# Patient Record
Sex: Female | Born: 1965 | Race: White | Hispanic: No | Smoking: Never smoker
Health system: Southern US, Community
[De-identification: ages and names within clinical notes are randomized; demographics above are authoritative.]

## PROBLEM LIST (undated history)

## (undated) DIAGNOSIS — M1990 Unspecified osteoarthritis, unspecified site: Secondary | ICD-10-CM

## (undated) DIAGNOSIS — B084 Enteroviral vesicular stomatitis with exanthem: Secondary | ICD-10-CM

## (undated) DIAGNOSIS — G8929 Other chronic pain: Secondary | ICD-10-CM

## (undated) DIAGNOSIS — F419 Anxiety disorder, unspecified: Secondary | ICD-10-CM

## (undated) DIAGNOSIS — M549 Dorsalgia, unspecified: Secondary | ICD-10-CM

## (undated) DIAGNOSIS — G43909 Migraine, unspecified, not intractable, without status migrainosus: Secondary | ICD-10-CM

## (undated) DIAGNOSIS — F32A Depression, unspecified: Secondary | ICD-10-CM

## (undated) DIAGNOSIS — R9431 Abnormal electrocardiogram [ECG] [EKG]: Secondary | ICD-10-CM

## (undated) DIAGNOSIS — F1120 Opioid dependence, uncomplicated: Secondary | ICD-10-CM

## (undated) DIAGNOSIS — I2510 Atherosclerotic heart disease of native coronary artery without angina pectoris: Secondary | ICD-10-CM

## (undated) DIAGNOSIS — E538 Deficiency of other specified B group vitamins: Secondary | ICD-10-CM

## (undated) DIAGNOSIS — R51 Headache: Secondary | ICD-10-CM

## (undated) DIAGNOSIS — Z972 Presence of dental prosthetic device (complete) (partial): Secondary | ICD-10-CM

## (undated) DIAGNOSIS — T753XXA Motion sickness, initial encounter: Secondary | ICD-10-CM

## (undated) DIAGNOSIS — D509 Iron deficiency anemia, unspecified: Secondary | ICD-10-CM

## (undated) DIAGNOSIS — M159 Polyosteoarthritis, unspecified: Secondary | ICD-10-CM

## (undated) DIAGNOSIS — S060X9A Concussion with loss of consciousness of unspecified duration, initial encounter: Secondary | ICD-10-CM

## (undated) DIAGNOSIS — Z8619 Personal history of other infectious and parasitic diseases: Secondary | ICD-10-CM

## (undated) DIAGNOSIS — K8020 Calculus of gallbladder without cholecystitis without obstruction: Secondary | ICD-10-CM

## (undated) DIAGNOSIS — I252 Old myocardial infarction: Secondary | ICD-10-CM

## (undated) DIAGNOSIS — S060XAA Concussion with loss of consciousness status unknown, initial encounter: Secondary | ICD-10-CM

## (undated) DIAGNOSIS — E559 Vitamin D deficiency, unspecified: Secondary | ICD-10-CM

## (undated) DIAGNOSIS — R55 Syncope and collapse: Secondary | ICD-10-CM

## (undated) DIAGNOSIS — R519 Headache, unspecified: Secondary | ICD-10-CM

## (undated) DIAGNOSIS — F329 Major depressive disorder, single episode, unspecified: Secondary | ICD-10-CM

## (undated) DIAGNOSIS — J45909 Unspecified asthma, uncomplicated: Secondary | ICD-10-CM

## (undated) DIAGNOSIS — I10 Essential (primary) hypertension: Secondary | ICD-10-CM

## (undated) DIAGNOSIS — F1190 Opioid use, unspecified, uncomplicated: Secondary | ICD-10-CM

## (undated) DIAGNOSIS — I421 Obstructive hypertrophic cardiomyopathy: Secondary | ICD-10-CM

## (undated) DIAGNOSIS — I5030 Unspecified diastolic (congestive) heart failure: Secondary | ICD-10-CM

## (undated) DIAGNOSIS — R002 Palpitations: Secondary | ICD-10-CM

## (undated) DIAGNOSIS — E669 Obesity, unspecified: Secondary | ICD-10-CM

## (undated) DIAGNOSIS — L409 Psoriasis, unspecified: Secondary | ICD-10-CM

## (undated) HISTORY — DX: Headache, unspecified: R51.9

## (undated) HISTORY — DX: Unspecified diastolic (congestive) heart failure: I50.30

## (undated) HISTORY — DX: Concussion with loss of consciousness status unknown, initial encounter: S06.0XAA

## (undated) HISTORY — DX: Headache: R51

## (undated) HISTORY — DX: Dorsalgia, unspecified: M54.9

## (undated) HISTORY — DX: Unspecified osteoarthritis, unspecified site: M19.90

## (undated) HISTORY — DX: Vitamin D deficiency, unspecified: E55.9

## (undated) HISTORY — DX: Iron deficiency anemia, unspecified: D50.9

## (undated) HISTORY — DX: Calculus of gallbladder without cholecystitis without obstruction: K80.20

## (undated) HISTORY — DX: Palpitations: R00.2

## (undated) HISTORY — DX: Unspecified asthma, uncomplicated: J45.909

## (undated) HISTORY — DX: Personal history of other infectious and parasitic diseases: Z86.19

## (undated) HISTORY — DX: Other chronic pain: G89.29

## (undated) HISTORY — DX: Concussion with loss of consciousness of unspecified duration, initial encounter: S06.0X9A

## (undated) HISTORY — DX: Migraine, unspecified, not intractable, without status migrainosus: G43.909

## (undated) HISTORY — DX: Psoriasis, unspecified: L40.9

## (undated) HISTORY — DX: Anxiety disorder, unspecified: F41.9

## (undated) HISTORY — DX: Atherosclerotic heart disease of native coronary artery without angina pectoris: I25.10

## (undated) HISTORY — PX: GASTROPLASTY: SHX192

## (undated) HISTORY — PX: ABDOMINOPLASTY: SUR9

## (undated) HISTORY — DX: Major depressive disorder, single episode, unspecified: F32.9

## (undated) HISTORY — DX: Old myocardial infarction: I25.2

## (undated) HISTORY — PX: GALLBLADDER SURGERY: SHX652

## (undated) HISTORY — DX: Obesity, unspecified: E66.9

## (undated) HISTORY — DX: Depression, unspecified: F32.A

## (undated) HISTORY — DX: Syncope and collapse: R55

## (undated) HISTORY — DX: Enteroviral vesicular stomatitis with exanthem: B08.4

## (undated) HISTORY — DX: Polyosteoarthritis, unspecified: M15.9

## (undated) HISTORY — DX: Deficiency of other specified B group vitamins: E53.8

## (undated) HISTORY — DX: Obstructive hypertrophic cardiomyopathy: I42.1

## (undated) HISTORY — DX: Abnormal electrocardiogram (ECG) (EKG): R94.31

---

## 1999-03-18 HISTORY — PX: GASTRIC BYPASS: SHX52

## 1999-03-18 HISTORY — PX: CHOLECYSTECTOMY: SHX55

## 1999-03-18 HISTORY — PX: BARIATRIC SURGERY: SHX1103

## 2005-09-22 ENCOUNTER — Ambulatory Visit: Payer: Self-pay | Admitting: Internal Medicine

## 2005-10-15 ENCOUNTER — Ambulatory Visit: Payer: Self-pay | Admitting: Internal Medicine

## 2005-11-15 ENCOUNTER — Ambulatory Visit: Payer: Self-pay | Admitting: Internal Medicine

## 2006-04-22 ENCOUNTER — Ambulatory Visit: Payer: Self-pay | Admitting: Internal Medicine

## 2006-05-16 ENCOUNTER — Ambulatory Visit: Payer: Self-pay | Admitting: Internal Medicine

## 2006-06-16 ENCOUNTER — Ambulatory Visit: Payer: Self-pay | Admitting: Internal Medicine

## 2006-07-20 ENCOUNTER — Ambulatory Visit: Payer: Self-pay

## 2006-08-03 ENCOUNTER — Ambulatory Visit: Payer: Self-pay | Admitting: Internal Medicine

## 2006-08-16 ENCOUNTER — Ambulatory Visit: Payer: Self-pay | Admitting: Internal Medicine

## 2006-10-16 ENCOUNTER — Ambulatory Visit: Payer: Self-pay | Admitting: Internal Medicine

## 2006-11-16 ENCOUNTER — Ambulatory Visit: Payer: Self-pay | Admitting: Internal Medicine

## 2007-01-16 ENCOUNTER — Ambulatory Visit: Payer: Self-pay | Admitting: Internal Medicine

## 2007-02-05 ENCOUNTER — Ambulatory Visit: Payer: Self-pay | Admitting: Internal Medicine

## 2007-02-15 ENCOUNTER — Ambulatory Visit: Payer: Self-pay | Admitting: Internal Medicine

## 2007-04-18 ENCOUNTER — Ambulatory Visit: Payer: Self-pay | Admitting: Internal Medicine

## 2007-05-28 ENCOUNTER — Ambulatory Visit: Payer: Self-pay | Admitting: Internal Medicine

## 2007-06-16 ENCOUNTER — Ambulatory Visit: Payer: Self-pay | Admitting: Internal Medicine

## 2007-07-30 ENCOUNTER — Ambulatory Visit: Payer: Self-pay | Admitting: Internal Medicine

## 2007-08-07 ENCOUNTER — Emergency Department: Payer: Self-pay | Admitting: Internal Medicine

## 2007-08-16 ENCOUNTER — Ambulatory Visit: Payer: Self-pay | Admitting: Internal Medicine

## 2007-10-26 ENCOUNTER — Ambulatory Visit: Payer: Self-pay | Admitting: Pain Medicine

## 2008-02-15 ENCOUNTER — Ambulatory Visit: Payer: Self-pay | Admitting: Internal Medicine

## 2008-02-18 ENCOUNTER — Ambulatory Visit: Payer: Self-pay | Admitting: Internal Medicine

## 2008-03-17 ENCOUNTER — Ambulatory Visit: Payer: Self-pay | Admitting: Internal Medicine

## 2008-07-20 ENCOUNTER — Emergency Department: Payer: Self-pay | Admitting: Internal Medicine

## 2008-08-15 ENCOUNTER — Ambulatory Visit: Payer: Self-pay | Admitting: Internal Medicine

## 2008-09-05 ENCOUNTER — Ambulatory Visit: Payer: Self-pay | Admitting: Internal Medicine

## 2008-09-14 ENCOUNTER — Ambulatory Visit: Payer: Self-pay | Admitting: Internal Medicine

## 2008-10-15 ENCOUNTER — Ambulatory Visit: Payer: Self-pay | Admitting: Internal Medicine

## 2009-07-20 ENCOUNTER — Ambulatory Visit: Payer: Self-pay | Admitting: Family Medicine

## 2009-07-20 DIAGNOSIS — T148XXA Other injury of unspecified body region, initial encounter: Secondary | ICD-10-CM

## 2009-07-20 DIAGNOSIS — M62838 Other muscle spasm: Secondary | ICD-10-CM | POA: Insufficient documentation

## 2010-04-16 NOTE — Assessment & Plan Note (Signed)
Summary: ANKLE INJURY/JBB   Vital Signs:  Patient Profile:   45 Years Old Female CC:      Right Ankle Pain, Due to minor injury x 5 Days Prior/ RWT Height:     64.5 inches Weight:      184 pounds BMI:     31.21 Temp:     97.6 degrees F oral Pulse rate:   64 / minute Pulse rhythm:   regular Resp:     18 per minute BP sitting:   131 / 85  (left arm)  Pt. in pain?   yes    Location:   ankle    Intensity:   6    Type:       aching  Vitals Entered By: Levonne Spiller EMT-P (Jul 20, 2009 2:47 PM)              Is Patient Diabetic? No      Current Allergies: ! PENICILLINHistory of Present Illness History from: patient Reason for visit: see chief complaint Chief Complaint: Right Ankle Pain, Due to minor injury x 5 Days Prior/ RWT History of Present Illness: Was hit just above the medial malleolus of the right leg by a softball about 5 days ago. For about 2 days after, she had difficulty bearing weight, having to walk with a cane. Now she has been wearing an ACE bandage with some relief. Walks with a limp, but with no assistance now.   Current Problems: MUSCLE STRAIN (ICD-848.9)   Current Meds WOMENS MULTIVITAMIN PLUS  TABS (MULTIPLE VITAMINS-MINERALS)  FISH OIL CONCENTRATE 300 MG CAPS (OMEGA-3 FATTY ACIDS) x 3 perday B COMPLEX  TABS (B COMPLEX VITAMINS)   REVIEW OF SYSTEMS Constitutional Symptoms      Denies fever, chills, night sweats, and fatigue.  Eyes       Denies change in vision. Ear/Nose/Throat/Mouth       Denies hearing loss/aids, change in hearing, ear discharge, dizziness, and sore throat.  Respiratory       Denies dry cough and productive cough.  Cardiovascular       Denies chest pain.    Gastrointestinal       Denies stomach pain. Neurological       Denies headaches, loss of or changes in sensation, numbness, and tngling. Musculoskeletal       Complains of joint stiffness, decreased range of motion, and swelling.      Denies joint pain and redness.     Psych       Denies mood changes, anxiety/stress, speech problems, depression, and sleep problems.  Past History:  Past Surgical History: Gastric bypass Physical Exam General appearance: well developed, well nourished, no acute distress Chest/Lungs: no rales, wheezes, or rhonchi bilateral, breath sounds equal without effort Heart: regular rate and  rhythm, no murmur Extremities: R ankle with full ROM. No tenderness at malleolus or metatarsals. + swelling, tenderness and slight bruising along the medial fibula of the right. Assessment New Problems: MUSCLE STRAIN (ICD-848.9)   Plan New Orders: New Patient Level II [99202]  The patient and/or caregiver has been counseled thoroughly with regard to medications prescribed including dosage, schedule, interactions, rationale for use, and possible side effects and they verbalize understanding.  Diagnoses and expected course of recovery discussed and will return if not improved as expected or if the condition worsens. Patient and/or caregiver verbalized understanding.   Patient Instructions: 1)  Take 650-1000mg  of Tylenol every 4-6 hours as needed for relief of pain or comfort of fever  AVOID taking more than 4000mg   in a 24 hour period (can cause liver damage in higher doses). 2)  You may move around but avoid painful motions. Apply ice to sore area for 20 minutes 3-4 times a day for 2-3 days.   I have reviewed the above medical office visit documention, including diagnoses, history, medications, clinical lists, orders and plan of care.   Rodney Langton, MD, FAAFP  Jul 20, 2009 Added new allergy or adverse reaction of PENICILLIN - Signed

## 2011-08-20 ENCOUNTER — Ambulatory Visit: Payer: Self-pay | Admitting: Internal Medicine

## 2011-09-15 ENCOUNTER — Ambulatory Visit: Payer: Self-pay | Admitting: Internal Medicine

## 2011-09-15 LAB — CBC CANCER CENTER
Comment - H1-Com1: NORMAL
Comment - H1-Com2: NORMAL
Eosinophil %: 2.8 %
Lymphocyte #: 1.9 x10 3/mm (ref 1.0–3.6)
Lymphocyte %: 34.1 %
Lymphocytes: 39 %
MCH: 28.7 pg (ref 26.0–34.0)
MCHC: 32.7 g/dL (ref 32.0–36.0)
MCV: 88 fL (ref 80–100)
Monocyte #: 0.5 x10 3/mm (ref 0.2–0.9)
Platelet: 242 x10 3/mm (ref 150–440)
RDW: 14.4 % (ref 11.5–14.5)
Segmented Neutrophils: 57 %

## 2011-09-15 LAB — RETICULOCYTES: Absolute Retic Count: 0.0449 10*6/uL (ref 0.024–0.084)

## 2011-09-15 LAB — LACTATE DEHYDROGENASE: LDH: 179 U/L (ref 84–246)

## 2011-09-15 LAB — IRON AND TIBC: Iron: 65 ug/dL (ref 50–170)

## 2011-10-16 ENCOUNTER — Ambulatory Visit: Payer: Self-pay | Admitting: Internal Medicine

## 2011-12-09 ENCOUNTER — Emergency Department: Payer: Self-pay | Admitting: *Deleted

## 2012-02-14 ENCOUNTER — Emergency Department: Payer: Self-pay | Admitting: Unknown Physician Specialty

## 2012-04-15 ENCOUNTER — Ambulatory Visit: Payer: Self-pay | Admitting: Internal Medicine

## 2012-04-17 ENCOUNTER — Ambulatory Visit: Payer: Self-pay | Admitting: Internal Medicine

## 2012-04-20 LAB — CANCER CENTER HEMOGLOBIN: HGB: 10.7 g/dL — ABNORMAL LOW (ref 12.0–16.0)

## 2012-04-20 LAB — IRON AND TIBC
Iron Bind.Cap.(Total): 443 ug/dL (ref 250–450)
Iron Saturation: 6 %
Unbound Iron-Bind.Cap.: 415 ug/dL

## 2012-04-20 LAB — FERRITIN: Ferritin (ARMC): 7 ng/mL — ABNORMAL LOW (ref 8–388)

## 2012-05-15 ENCOUNTER — Ambulatory Visit: Payer: Self-pay | Admitting: Internal Medicine

## 2012-06-21 ENCOUNTER — Ambulatory Visit: Payer: Self-pay | Admitting: Internal Medicine

## 2012-06-22 LAB — FERRITIN: Ferritin (ARMC): 75 ng/mL (ref 8–388)

## 2012-06-22 LAB — CBC CANCER CENTER
Basophil #: 0.1 x10 3/mm (ref 0.0–0.1)
Eosinophil %: 2.3 %
HCT: 36.6 % (ref 35.0–47.0)
HGB: 12 g/dL (ref 12.0–16.0)
MCH: 27.8 pg (ref 26.0–34.0)
MCV: 85 fL (ref 80–100)
Monocyte %: 7.2 %
Neutrophil #: 5.4 x10 3/mm (ref 1.4–6.5)
Neutrophil %: 68.9 %
Platelet: 197 x10 3/mm (ref 150–440)
WBC: 7.8 x10 3/mm (ref 3.6–11.0)

## 2012-06-22 LAB — IRON AND TIBC: Iron Saturation: 21 %

## 2012-07-09 ENCOUNTER — Encounter: Payer: Self-pay | Admitting: Cardiovascular Disease

## 2012-07-09 ENCOUNTER — Ambulatory Visit (INDEPENDENT_AMBULATORY_CARE_PROVIDER_SITE_OTHER): Payer: Medicare Other | Admitting: Cardiovascular Disease

## 2012-07-09 VITALS — BP 120/80 | HR 60 | Ht 66.0 in | Wt 176.5 lb

## 2012-07-09 DIAGNOSIS — R55 Syncope and collapse: Secondary | ICD-10-CM | POA: Insufficient documentation

## 2012-07-09 DIAGNOSIS — R0789 Other chest pain: Secondary | ICD-10-CM | POA: Insufficient documentation

## 2012-07-09 DIAGNOSIS — I4729 Other ventricular tachycardia: Secondary | ICD-10-CM | POA: Insufficient documentation

## 2012-07-09 DIAGNOSIS — I472 Ventricular tachycardia: Secondary | ICD-10-CM

## 2012-07-09 DIAGNOSIS — I4581 Long QT syndrome: Secondary | ICD-10-CM | POA: Insufficient documentation

## 2012-07-09 NOTE — Assessment & Plan Note (Signed)
Etiology of her recent episode of syncope is uncertain. She does report having an energy pill/drink around the time of her syncope. Uncertain if this was related. Holter monitor showing run of nonsustained VT. We have ordered a 30 day monitor to closely watch her. Heart rate is low at baseline, will not add beta blockers.

## 2012-07-09 NOTE — Patient Instructions (Addendum)
We will schedule you for an echocardiogram for prolonged QT, arrhythmia on your holter monitor, chest pain  We will order a 30 day monitor for arrhthmia, prolonged QTc  Please call us if you have new issues that need to be addressed before your next appt.  Your physician wants you to follow-up in: 5 weeks

## 2012-07-09 NOTE — Assessment & Plan Note (Signed)
Nonsustained VT, 8 beats on Holter monitor. Echocardiogram ordered to evaluate cardiac function, EF No medication changes made given bradycardia

## 2012-07-09 NOTE — Progress Notes (Signed)
Patient ID: Cheyenne Gray, female    DOB: 16-Jun-1965, 47 y.o.   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 He took 1 week or more for the bruise on her forehand to heal.  At the time of her syncope, she reports taking any energy drink/panel. 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 Uncertain if this was a factor. She does have rare episodes of chest pain, sometimes at rest and sometimes with exertion. Occasional tachycardia and palpitation episodes. She recently wore a 2 day monitor that showed rare PVCs, run of nonsustained VT, 8 beats, rare APCs. Total number of PVCs counted 370. Some bradycardia, heart rate down to 46 beats per minute per the Holter  Typically with exertion, she feels okay. No reproducible chest pain with exercise. EKG shows normal sinus rhythm, QT is 435  Notes from primary care were reviewed 48-hour Holter monitor available and was reviewed     Outpatient Encounter Prescriptions as of 07/09/2012  Medication Sig Dispense Refill  . albuterol (PROVENTIL HFA;VENTOLIN HFA) 108 (90 BASE) MCG/ACT inhaler Inhale 2 puffs into the lungs as needed for wheezing.      . gabapentin (NEURONTIN) 400 MG capsule Takes 2 tablet am and 3 tablets pm daily.      . methadone (DOLOPHINE) 10 MG tablet Take 20 mg by mouth 3 (three) times daily as needed for pain.       No facility-administered encounter medications on file as of 07/09/2012.     Review of Systems  Constitutional: Negative.   HENT: Negative.   Eyes: Negative.   Respiratory:  Negative.   Cardiovascular: Positive for chest pain.  Gastrointestinal: Negative.   Musculoskeletal: Negative.   Skin: Negative.   Neurological: Positive for syncope.  Psychiatric/Behavioral: Negative.   All other systems reviewed and are negative.    BP 120/80  Pulse 60  Ht 5\' 6"  (1.676 m)  Wt 176 lb 8 oz (80.06 kg)  BMI 28.5 kg/m2  Physical Exam  Nursing note and vitals reviewed. Constitutional: She is oriented to person, place, and time. She appears well-developed and well-nourished.  HENT:  Head: Normocephalic.  Nose: Nose normal.  Mouth/Throat: Oropharynx is clear and moist.  Eyes: Conjunctivae are normal. Pupils are equal, round, and reactive to light.  Neck: Normal range of motion. Neck supple. No JVD present.  Cardiovascular: Normal rate, regular rhythm, S1 normal, S2 normal, normal heart sounds and intact distal pulses.  Exam reveals no gallop and no friction rub.   No murmur heard. Pulmonary/Chest: Effort normal and breath sounds normal. No respiratory distress. She has no wheezes. She has no rales. She exhibits no tenderness.  Abdominal: Soft. Bowel sounds are normal. She exhibits no distension. There is no tenderness.  Musculoskeletal: Normal range of motion. She exhibits no edema and no tenderness.  Lymphadenopathy:    She has no cervical adenopathy.  Neurological: She is alert and oriented to person, place, and time. Coordination normal.  Skin: Skin is warm and dry. No rash noted. No erythema.  Psychiatric: She has a normal mood and  affect. Her behavior is normal. Judgment and thought content normal.    Assessment and Plan

## 2012-07-09 NOTE — Assessment & Plan Note (Signed)
Atypical type chest pain though concerning for runs of arrhythmia given symptoms of tachycardia. 30 day monitor ordered

## 2012-07-09 NOTE — Assessment & Plan Note (Signed)
History of prolonged QT. Adequate on today's EKG, 435. Methadone dose has been decreased in the past with improvement of her QT per the patient

## 2012-07-13 ENCOUNTER — Encounter: Payer: Self-pay | Admitting: Cardiovascular Disease

## 2012-07-15 ENCOUNTER — Ambulatory Visit: Payer: Self-pay | Admitting: Internal Medicine

## 2012-07-15 DIAGNOSIS — I4581 Long QT syndrome: Secondary | ICD-10-CM

## 2012-07-22 ENCOUNTER — Other Ambulatory Visit (INDEPENDENT_AMBULATORY_CARE_PROVIDER_SITE_OTHER): Payer: Medicare Other

## 2012-07-22 ENCOUNTER — Other Ambulatory Visit: Payer: Self-pay

## 2012-07-22 DIAGNOSIS — I4729 Other ventricular tachycardia: Secondary | ICD-10-CM

## 2012-07-22 DIAGNOSIS — R0789 Other chest pain: Secondary | ICD-10-CM

## 2012-07-22 DIAGNOSIS — I472 Ventricular tachycardia: Secondary | ICD-10-CM

## 2012-07-22 DIAGNOSIS — I4581 Long QT syndrome: Secondary | ICD-10-CM

## 2012-08-18 ENCOUNTER — Ambulatory Visit (INDEPENDENT_AMBULATORY_CARE_PROVIDER_SITE_OTHER): Payer: Medicare Other | Admitting: Cardiovascular Disease

## 2012-08-18 ENCOUNTER — Encounter: Payer: Self-pay | Admitting: Cardiovascular Disease

## 2012-08-18 VITALS — BP 112/80 | HR 58 | Ht 65.0 in | Wt 176.5 lb

## 2012-08-18 DIAGNOSIS — R55 Syncope and collapse: Secondary | ICD-10-CM

## 2012-08-18 DIAGNOSIS — I4729 Other ventricular tachycardia: Secondary | ICD-10-CM

## 2012-08-18 DIAGNOSIS — R079 Chest pain, unspecified: Secondary | ICD-10-CM

## 2012-08-18 DIAGNOSIS — R0602 Shortness of breath: Secondary | ICD-10-CM

## 2012-08-18 DIAGNOSIS — I472 Ventricular tachycardia: Secondary | ICD-10-CM

## 2012-08-18 DIAGNOSIS — R0789 Other chest pain: Secondary | ICD-10-CM

## 2012-08-18 NOTE — Patient Instructions (Addendum)
No medication changes were made.  If you continue to have lightheaded spells or pass out spells, call the office We would start a pill to support your blood pressure  Please call us if you have new issues that need to be addressed before your next appt.  Your physician wants you to follow-up in: 6 months.  You will receive a reminder letter in the mail two months in advance. If you don't receive a letter, please call our office to schedule the follow-up appointment.

## 2012-08-18 NOTE — Assessment & Plan Note (Signed)
He denies any recent symptoms of chest pressure but does report having numbness down one of her arms on one episode. Not associated with exertion. No further workup at this time

## 2012-08-18 NOTE — Assessment & Plan Note (Signed)
Short run seen on Holter monitor. No significant arrhythmia on 30 day monitor

## 2012-08-18 NOTE — Assessment & Plan Note (Signed)
No significant arrhythmia on recent 30 day monitor. She does report having near syncope or syncope 2 weeks ago. This raises the concern for arrhythmia while she was not wearing her monitor or a drop in her blood pressure, possibly from vasovagal. With her recent weight loss after gastric bypass surgery, minimal by mouth and fluid intake, her blood pressure is probably running low. I've asked her to increase her fluid and salt intake. If she continues to have symptoms, she may need further evaluation for florinef to maintain her blood pressure and minimize symptoms .

## 2012-08-18 NOTE — Progress Notes (Signed)
Patient ID: Cheyenne Gray, female    DOB: 11-Nov-1965, 47 y.o.   MRN: [id number]  HPI Comments: Ms. Gelles is a very pleasant 47 year old woman with history of obesity, gastric bypass 10 years ago, family history of prolonged QT, Initially presenting with symptoms of palpitations, tachycardia, chest pain, notes indicating history of prolonged QT in the past, on methadone for DJD and chronic back pain after a traumatic injury, history of syncope while she was standing in the doorway when she woke up after a hit to her forehand.  took 1 week or more for the bruise on her forehand to heal.  At the time of her syncope, she reports taking any energy drink/panel. 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 Uncertain if this was a factor. She does have rare episodes of chest pain, sometimes at rest and sometimes with exertion. Occasional tachycardia and palpitation episodes.   2 day monitor that showed rare PVCs, run of nonsustained VT, 8 beats, rare APCs. Total number of PVCs counted 370. Some bradycardia, heart rate down to 46 beats per minute per the Holter.  30 day monitor was performed and followup that did not show any significant arrhythmia.  Today she reports having an episode of near syncope or syncope 2 weeks ago. Uncertain if she was wearing a monitor. Denied any palpitations, just lightheaded. She was standing at the time. On further discussion, she's not eating very much since her gastric bypass. She rarely has bowel movements, as long stretches without eating. Also does not drink probably as much if she should. Weight has been dropping despite her efforts to maintain her weight. Blood pressure sometimes runs very low but she does not check it often as she does not have a blood pressure cuff.  EKG shows normal sinus rhythm with rate 58 beats per minute, no significant ST or T wave changes    Outpatient Encounter Prescriptions as of 08/18/2012  Medication Sig Dispense Refill  . albuterol  (PROVENTIL HFA;VENTOLIN HFA) 108 (90 BASE) MCG/ACT inhaler Inhale 2 puffs into the lungs as needed for wheezing.      . baclofen (LIORESAL) 10 MG tablet Take 10 mg by mouth as needed.       . gabapentin (NEURONTIN) 400 MG capsule Takes 2 tablet am and 2 tablets noon and 2 pm daily.      . methadone (DOLOPHINE) 10 MG tablet Take 20 mg by mouth 3 (three) times daily as needed for pain.         Review of Systems  Constitutional: Negative.   HENT: Negative.   Eyes: Negative.   Respiratory: Negative.   Gastrointestinal: Negative.   Musculoskeletal: Negative.   Skin: Negative.   Neurological: Positive for syncope.  Psychiatric/Behavioral: Negative.   All other systems reviewed and are negative.    BP 112/80  Pulse 58  Ht 5\' 5"  (1.651 m)  Wt 176 lb 8 oz (80.06 kg)  BMI 29.37 kg/m2  Physical Exam  Nursing note and vitals reviewed. Constitutional: She is oriented to person, place, and time. She appears well-developed and well-nourished.  HENT:  Head: Normocephalic.  Nose: Nose normal.  Mouth/Throat: Oropharynx is clear and moist.  Eyes: Conjunctivae are normal. Pupils are equal, round, and reactive to light.  Neck: Normal range of motion. Neck supple. No JVD present.  Cardiovascular: Normal rate, regular rhythm, S1 normal, S2 normal, normal heart sounds and intact distal pulses.  Exam reveals no gallop and no friction rub.   No murmur heard. Pulmonary/Chest:  Effort normal and breath sounds normal. No respiratory distress. She has no wheezes. She has no rales. She exhibits no tenderness.  Abdominal: Soft. Bowel sounds are normal. She exhibits no distension. There is no tenderness.  Musculoskeletal: Normal range of motion. She exhibits no edema and no tenderness.  Lymphadenopathy:    She has no cervical adenopathy.  Neurological: She is alert and oriented to person, place, and time. Coordination normal.  Skin: Skin is warm and dry. No rash noted. No erythema.  Psychiatric: She has a  normal mood and affect. Her behavior is normal. Judgment and thought content normal.    Assessment and Plan

## 2012-08-19 ENCOUNTER — Encounter: Payer: Self-pay | Admitting: Cardiovascular Disease

## 2012-08-19 ENCOUNTER — Encounter (INDEPENDENT_AMBULATORY_CARE_PROVIDER_SITE_OTHER): Payer: Medicare Other

## 2012-09-14 ENCOUNTER — Ambulatory Visit: Payer: Self-pay | Admitting: Internal Medicine

## 2012-10-25 ENCOUNTER — Telehealth: Payer: Self-pay | Admitting: *Deleted

## 2012-10-25 NOTE — Telephone Encounter (Signed)
Before starting anything to support BP, need to known where BP is running She needs a BP cuff Need to know circumstances of syncope Was she eating (she had been missing meals) Lost more weight from gastric bypass, if so, where is BP running  Palpitations were separate from syncope episode? Can she measure heart rate with BP cuff?

## 2012-10-25 NOTE — Telephone Encounter (Signed)
Returned call to Cheyenne Gray, Cheyenne Gray reports syncopal episode and passed out 1.5 week ago. Cheyenne Gray reports heart racing, palpitations x 1 week ago.  Unsure of BP at time of episodes does not have home monitor.  Cheyenne Gray states she is not having syncopal episodes at present, reports occasionally feeling dizzy. Per Dr Windell Hummingbird last office note 08/18/12 If you continue to have lightheaded spells or pass out spells, call the office we would start a pill to support your blood pressure.  Please advise.

## 2012-10-25 NOTE — Telephone Encounter (Signed)
Patient call she has passed out a couple of times and is have heart palpatations. Please advise

## 2012-10-26 NOTE — Telephone Encounter (Signed)
Spoke with pt advised pt to purchase BP cuff and monitor HR and BP daily at same time of day and prn when having syncopal episodes or palpitations. Advised we need to know what BP is running before we will rx a medication to increase pt's BP.  Pt is in agreence with this plan.  Pt states she is only able to eat small meals and is trying to eat them regularly.  Advised pt not eating regularly or enough po intake can cause dizziness as well.  Pt will purchase BP monitor and keep a log of BP and HR and call back with further syncopal episodes or palpitations.  Pt will also try to eat regularly and stay hydrated as well.

## 2013-01-17 ENCOUNTER — Ambulatory Visit: Payer: Self-pay | Admitting: Internal Medicine

## 2013-01-17 LAB — IRON AND TIBC
Iron Bind.Cap.(Total): 300 ug/dL (ref 250–450)
Iron Saturation: 11 %
Iron: 33 ug/dL — ABNORMAL LOW (ref 50–170)

## 2013-02-14 ENCOUNTER — Encounter (INDEPENDENT_AMBULATORY_CARE_PROVIDER_SITE_OTHER): Payer: Self-pay

## 2013-02-14 ENCOUNTER — Ambulatory Visit (INDEPENDENT_AMBULATORY_CARE_PROVIDER_SITE_OTHER): Payer: Medicare Other | Admitting: Cardiovascular Disease

## 2013-02-14 ENCOUNTER — Ambulatory Visit: Payer: Self-pay | Admitting: Internal Medicine

## 2013-02-14 ENCOUNTER — Encounter: Payer: Self-pay | Admitting: Cardiovascular Disease

## 2013-02-14 VITALS — BP 110/64 | HR 58 | Ht 68.0 in | Wt 173.2 lb

## 2013-02-14 DIAGNOSIS — R0602 Shortness of breath: Secondary | ICD-10-CM

## 2013-02-14 DIAGNOSIS — R0789 Other chest pain: Secondary | ICD-10-CM

## 2013-02-14 DIAGNOSIS — I4581 Long QT syndrome: Secondary | ICD-10-CM

## 2013-02-14 DIAGNOSIS — R000 Tachycardia, unspecified: Secondary | ICD-10-CM

## 2013-02-14 DIAGNOSIS — R079 Chest pain, unspecified: Secondary | ICD-10-CM

## 2013-02-14 DIAGNOSIS — R55 Syncope and collapse: Secondary | ICD-10-CM

## 2013-02-14 NOTE — Progress Notes (Signed)
Patient ID: Cheyenne Gray, female    DOB: Nov 29, 1965, 47 y.o.   MRN: [id number]  HPI Comments: Cheyenne Gray is a very pleasant 47 year old woman with history of obesity, gastric bypass 12 years ago, family history of prolonged QT, Initially presenting with symptoms of palpitations, tachycardia, chest pain, notes indicating history of prolonged QT in the past, on methadone for DJD and chronic back pain after a traumatic injury, history of syncope while she was standing in the doorway when she woke up after a hit to her forehand.  took 1 week or more for the bruise on her forehand to heal.  At the time of her syncope, she reports taking any energy drink/panel. 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 Uncertain if this was a factor. She continues to have rare episodes of chest pain, sometimes at rest and sometimes with exertion. Occasional tachycardia and palpitation episodes. In general she does not eat much, reports having only one bowel movement once per week which she attributes to gastric bypass surgery. She misses several meals at a time reports sometimes going for long periods without food. No recent syncope  Previously she wore a 2 day monitor that showed rare PVCs, run of nonsustained VT, 8 beats, rare APCs. Total number of PVCs counted 370. Some bradycardia, heart rate down to 46 beats per minute per the Holter.  30 day monitor was performed and followup that did not show any significant arrhythmia.  EKG shows normal sinus rhythm with rate 58 beats per minute, nonspecific ST abnormality    Outpatient Encounter Prescriptions as of 02/14/2013  Medication Sig  . albuterol (PROVENTIL HFA;VENTOLIN HFA) 108 (90 BASE) MCG/ACT inhaler Inhale 2 puffs into the lungs as needed for wheezing.  . gabapentin (NEURONTIN) 400 MG capsule Takes 2 tablet am and 2 tablets noon and 2 pm daily.  . methadone (DOLOPHINE) 10 MG tablet Take 20 mg by mouth 3 (three) times daily as needed for pain.  .  [DISCONTINUED] baclofen (LIORESAL) 10 MG tablet Take 10 mg by mouth as needed.       Review of Systems  Constitutional: Negative.   HENT: Negative.   Eyes: Negative.   Respiratory: Negative.   Cardiovascular: Negative.   Gastrointestinal: Negative.   Endocrine: Negative.   Musculoskeletal: Negative.   Skin: Negative.   Allergic/Immunologic: Negative.   Hematological: Negative.   Psychiatric/Behavioral: Negative.   All other systems reviewed and are negative.    BP 110/64  Pulse 58  Ht 5\' 8"  (1.727 m)  Wt 173 lb 4 oz (78.586 kg)  BMI 26.35 kg/m2  Physical Exam  Nursing note and vitals reviewed. Constitutional: She is oriented to person, place, and time. She appears well-developed and well-nourished.  HENT:  Head: Normocephalic.  Nose: Nose normal.  Mouth/Throat: Oropharynx is clear and moist.  Eyes: Conjunctivae are normal. Pupils are equal, round, and reactive to light.  Neck: Normal range of motion. Neck supple. No JVD present.  Cardiovascular: Normal rate, regular rhythm, S1 normal, S2 normal, normal heart sounds and intact distal pulses.  Exam reveals no gallop and no friction rub.   No murmur heard. Pulmonary/Chest: Effort normal and breath sounds normal. No respiratory distress. She has no wheezes. She has no rales. She exhibits no tenderness.  Abdominal: Soft. Bowel sounds are normal. She exhibits no distension. There is no tenderness.  Musculoskeletal: Normal range of motion. She exhibits no edema and no tenderness.  Lymphadenopathy:    She has no cervical adenopathy.  Neurological: She  is alert and oriented to person, place, and time. Coordination normal.  Skin: Skin is warm and dry. No rash noted. No erythema.  Psychiatric: She has a normal mood and affect. Her behavior is normal. Judgment and thought content normal.    Assessment and Plan

## 2013-02-14 NOTE — Patient Instructions (Signed)
You are doing well. No medication changes were made.  Please call us if you have new issues that need to be addressed before your next appt.  Your physician wants you to follow-up in: 12 months.  You will receive a reminder letter in the mail two months in advance. If you don't receive a letter, please call our office to schedule the follow-up appointment. 

## 2013-02-14 NOTE — Assessment & Plan Note (Signed)
No recent syncope. Encouraged her to stay on a consistent diet and drink plenty of fluids. Try to avoid long stretches without food as she is currently doing

## 2013-02-14 NOTE — Assessment & Plan Note (Signed)
Normal QT on today's visit. No further workup

## 2013-02-14 NOTE — Assessment & Plan Note (Signed)
Atypical in nature. No further workup at this time

## 2013-04-11 ENCOUNTER — Other Ambulatory Visit: Payer: Self-pay | Admitting: Family Medicine

## 2013-04-11 ENCOUNTER — Ambulatory Visit: Payer: Self-pay | Admitting: Family Medicine

## 2013-04-11 LAB — HCG, QUANTITATIVE, PREGNANCY: Beta Hcg, Quant.: <1 m[IU]/mL — ABNORMAL LOW

## 2013-04-22 ENCOUNTER — Encounter: Payer: Self-pay | Admitting: Cardiovascular Disease

## 2013-04-22 ENCOUNTER — Ambulatory Visit (INDEPENDENT_AMBULATORY_CARE_PROVIDER_SITE_OTHER): Payer: Medicare Other | Admitting: Cardiovascular Disease

## 2013-04-22 VITALS — BP 110/78 | HR 49 | Ht 66.0 in | Wt 173.5 lb

## 2013-04-22 DIAGNOSIS — R0789 Other chest pain: Secondary | ICD-10-CM

## 2013-04-22 DIAGNOSIS — R079 Chest pain, unspecified: Secondary | ICD-10-CM

## 2013-04-22 DIAGNOSIS — I2510 Atherosclerotic heart disease of native coronary artery without angina pectoris: Secondary | ICD-10-CM

## 2013-04-22 DIAGNOSIS — I739 Peripheral vascular disease, unspecified: Secondary | ICD-10-CM

## 2013-04-22 DIAGNOSIS — E785 Hyperlipidemia, unspecified: Secondary | ICD-10-CM

## 2013-04-22 NOTE — Assessment & Plan Note (Signed)
Chest pain is atypical in nature. Discussed with her symptoms concerning for angina. She will call our office if symptoms present with exertion
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
## 2013-04-22 NOTE — Assessment & Plan Note (Signed)
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 Again we'll need a statin, goal LDL less than 70

## 2013-04-22 NOTE — Patient Instructions (Signed)
You are doing well. No medication changes were made.  We will check cholesterol and liver numbers today Based on the numbers we can start a cholesterol medication  Please call us if you have new issues that need to be addressed before your next appt.  Your physician wants you to follow-up in: 6 months.  You will receive a reminder letter in the mail two months in advance. If you don't receive a letter, please call our office to schedule the follow-up appointment.

## 2013-04-22 NOTE — Assessment & Plan Note (Signed)
CT scan showing CAD, notably in the mid to distal RCA. We have recommended recheck cholesterol today. Goal LDL less than 70. Cholesterol panel can be started after lab work has returned. She is a nonsmoker, nondiabetic.

## 2013-04-25 ENCOUNTER — Ambulatory Visit: Payer: Self-pay | Admitting: Internal Medicine

## 2013-04-26 ENCOUNTER — Ambulatory Visit: Payer: Self-pay | Admitting: Internal Medicine

## 2013-04-26 LAB — CBC CANCER CENTER
Basophil #: 0.1 x10 3/mm (ref 0.0–0.1)
Basophil %: 1.8 %
Eosinophil #: 0.2 x10 3/mm (ref 0.0–0.7)
Eosinophil %: 3.9 %
HCT: 41.3 % (ref 35.0–47.0)
HGB: 13.6 g/dL (ref 12.0–16.0)
LYMPHS ABS: 2.7 x10 3/mm (ref 1.0–3.6)
Lymphocyte %: 42.9 %
MCH: 30.3 pg (ref 26.0–34.0)
MCHC: 32.9 g/dL (ref 32.0–36.0)
MCV: 92 fL (ref 80–100)
MONO ABS: 0.4 x10 3/mm (ref 0.2–0.9)
MONOS PCT: 6.8 %
NEUTROS ABS: 2.8 x10 3/mm (ref 1.4–6.5)
Neutrophil %: 44.6 %
Platelet: 251 x10 3/mm (ref 150–440)
RBC: 4.49 10*6/uL (ref 3.80–5.20)
RDW: 13.5 % (ref 11.5–14.5)
WBC: 6.3 x10 3/mm (ref 3.6–11.0)

## 2013-04-26 LAB — FERRITIN: FERRITIN (ARMC): 54 ng/mL (ref 8–388)

## 2013-04-26 LAB — IRON AND TIBC
IRON BIND. CAP.(TOTAL): 340 ug/dL (ref 250–450)
Iron Saturation: 39 %
Iron: 132 ug/dL (ref 50–170)
UNBOUND IRON-BIND. CAP.: 208 ug/dL

## 2013-05-04 ENCOUNTER — Other Ambulatory Visit: Payer: Self-pay | Admitting: Cardiovascular Disease

## 2013-05-04 LAB — LIPID PANEL
Cholesterol: 147 mg/dL
HDL Cholesterol: 58 mg/dL
Ldl Cholesterol, Calc: 73 mg/dL
Triglycerides: 79 mg/dL
VLDL Cholesterol, Calc: 16 mg/dL

## 2013-05-04 LAB — HEPATIC FUNCTION PANEL A (ARMC)
Albumin: 3.1 g/dL — ABNORMAL LOW
Alkaline Phosphatase: 84 U/L
Bilirubin, Direct: <0.1 mg/dL
Bilirubin,Total: 0.3 mg/dL
SGOT(AST): 38 U/L — ABNORMAL HIGH
SGPT (ALT): 39 U/L
Total Protein: 6.4 g/dL

## 2013-05-06 ENCOUNTER — Ambulatory Visit: Payer: Self-pay | Admitting: Gastroenterology

## 2013-05-10 LAB — PATHOLOGY REPORT

## 2013-05-15 ENCOUNTER — Ambulatory Visit: Payer: Self-pay | Admitting: Internal Medicine

## 2013-05-23 ENCOUNTER — Telehealth: Payer: Self-pay
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
## 2013-05-24 ENCOUNTER — Encounter: Payer: Self-pay | Admitting: Cardiovascular Disease

## 2013-05-24 ENCOUNTER — Ambulatory Visit (INDEPENDENT_AMBULATORY_CARE_PROVIDER_SITE_OTHER): Payer: Medicare Other | Admitting: Cardiovascular Disease

## 2013-05-24 VITALS — BP 100/58 | HR 57 | Ht 68.0 in | Wt 176.0 lb

## 2013-05-24 DIAGNOSIS — R0602 Shortness of breath: Secondary | ICD-10-CM

## 2013-05-24 DIAGNOSIS — I2510 Atherosclerotic heart disease of native coronary artery without angina pectoris: Secondary | ICD-10-CM

## 2013-05-24 DIAGNOSIS — R42 Dizziness and giddiness: Secondary | ICD-10-CM

## 2013-05-24 DIAGNOSIS — R55 Syncope and collapse: Secondary | ICD-10-CM

## 2013-05-24 DIAGNOSIS — R5381 Other malaise: Secondary | ICD-10-CM

## 2013-05-24 DIAGNOSIS — R5383 Other fatigue: Secondary | ICD-10-CM

## 2013-05-24 DIAGNOSIS — R0789 Other chest pain: Secondary | ICD-10-CM
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
## 2013-05-24 NOTE — Patient Instructions (Signed)
You are doing well. No medication changes were made.  Please call us if you have new issues that need to be addressed before your next appt.  Your physician wants you to follow-up in: 6 months.  You will receive a reminder letter in the mail two months in advance. If you don't receive a letter, please call our office to schedule the follow-up appointment.   

## 2013-05-25 NOTE — Assessment & Plan Note (Signed)
Currently with no symptoms of angina. No further workup at this time. Continue current medication regimen. 

## 2013-05-25 NOTE — Assessment & Plan Note (Signed)
Etiology not clear. Now at baseline. Unable to exclude side effects from methadone. She is also not eating for long periods of times ("days"). Suggested she eat several times a day, even if small meals.

## 2013-05-25 NOTE — Assessment & Plan Note (Signed)
No recent episodes. Possibly from not eating for long stretches.

## 2013-06-07 NOTE — Progress Notes (Signed)
This encounter was created in error - please disregard.

## 2013-10-13 ENCOUNTER — Ambulatory Visit: Payer: Self-pay | Admitting: Internal Medicine

## 2013-10-25 ENCOUNTER — Ambulatory Visit: Payer: Self-pay | Admitting: Internal Medicine

## 2013-10-29 ENCOUNTER — Emergency Department: Payer: Self-pay | Admitting: Emergency Medicine

## 2013-12-28 ENCOUNTER — Ambulatory Visit: Payer: Medicare Other | Admitting: Cardiovascular Disease

## 2013-12-30 ENCOUNTER — Ambulatory Visit: Payer: Self-pay | Admitting: Internal Medicine

## 2014-01-02 LAB — CBC CANCER CENTER
Basophil #: 0.1 x10 3/mm (ref 0.0–0.1)
Basophil %: 0.9 %
Eosinophil #: 0.2 x10 3/mm (ref 0.0–0.7)
Eosinophil %: 3.8 %
HCT: 39.9 % (ref 35.0–47.0)
HGB: 13 g/dL (ref 12.0–16.0)
Lymphocyte #: 2.3 x10 3/mm (ref 1.0–3.6)
Lymphocyte %: 34.5 %
MCH: 29.7 pg (ref 26.0–34.0)
MCHC: 32.5 g/dL (ref 32.0–36.0)
MCV: 91 fL (ref 80–100)
Monocyte #: 0.5 x10 3/mm (ref 0.2–0.9)
Monocyte %: 7.6 %
Neutrophil #: 3.5 x10 3/mm (ref 1.4–6.5)
Neutrophil %: 53.2 %
PLATELETS: 244 x10 3/mm (ref 150–440)
RBC: 4.36 10*6/uL (ref 3.80–5.20)
RDW: 12.8 % (ref 11.5–14.5)
WBC: 6.6 x10 3/mm (ref 3.6–11.0)

## 2014-01-02 LAB — IRON AND TIBC
IRON BIND. CAP.(TOTAL): 340 ug/dL (ref 250–450)
Iron Saturation: 12 %
Iron: 40 ug/dL — ABNORMAL LOW (ref 50–170)
Unbound Iron-Bind.Cap.: 300 ug/dL

## 2014-01-02 LAB — FERRITIN: Ferritin (ARMC): 22 ng/mL (ref 8–388)

## 2014-01-10 ENCOUNTER — Ambulatory Visit (INDEPENDENT_AMBULATORY_CARE_PROVIDER_SITE_OTHER): Payer: Medicare Other | Admitting: Cardiovascular Disease

## 2014-01-10 ENCOUNTER — Encounter: Payer: Self-pay | Admitting: Cardiovascular Disease

## 2014-01-10 VITALS — BP 112/64 | HR 74 | Ht 64.0 in | Wt 184.0 lb

## 2014-01-10 DIAGNOSIS — I2510 Atherosclerotic heart disease of native coronary artery without angina pectoris: Secondary | ICD-10-CM

## 2014-01-10 DIAGNOSIS — I493 Ventricular premature depolarization: Secondary | ICD-10-CM

## 2014-01-10 DIAGNOSIS — I4581 Long QT syndrome: Secondary | ICD-10-CM

## 2014-01-10 DIAGNOSIS — E785 Hyperlipidemia, unspecified: Secondary | ICD-10-CM

## 2014-01-10 DIAGNOSIS — R002 Palpitations: Secondary | ICD-10-CM

## 2014-01-10 MED ORDER — METOPROLOL TARTRATE 25 MG PO TABS: 25.0000 mg | ORAL_TABLET | Freq: Two times a day (BID) | ORAL | Status: DC | PRN

## 2014-01-10 NOTE — Assessment & Plan Note (Signed)
Currently with no symptoms of angina. No further workup at this time. Continue current medication regimen. 

## 2014-01-10 NOTE — Assessment & Plan Note (Signed)
Normal QTC on today's visit

## 2014-01-10 NOTE — Patient Instructions (Addendum)
Your next appointment will be scheduled in our new office located at :  [facility]  [address], [address]  [place], [place] [address]  You are doing well. Start a 1/2 to 1 whole pill as needed for palpitations  We will check labs today  Please call us if you have new issues that need to be addressed before your next appt.  Your physician wants you to follow-up in: 12 months.  You will receive a reminder letter in the mail two months in advance. If you don't receive a letter, please call our office to schedule the follow-up appointment.

## 2014-01-10 NOTE — Assessment & Plan Note (Signed)
She reports having palpitations. We'll start metoprolol 25 mg twice a day when necessary
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
## 2014-01-11 LAB — LIPID PANEL
CHOLESTEROL TOTAL: 187 mg/dL (ref 100–199)
Chol/HDL Ratio: 3 ratio units (ref 0.0–4.4)
HDL: 63 mg/dL (ref >39)
LDL CALC: 101 mg/dL — AB (ref 0–99)
Triglycerides: 115 mg/dL (ref 0–149)
VLDL CHOLESTEROL CAL: 23 mg/dL (ref 5–40)

## 2014-01-13 ENCOUNTER — Other Ambulatory Visit: Payer: Self-pay

## 2014-01-13 MED ORDER — ATORVASTATIN CALCIUM 10 MG PO TABS: 10.0000 mg | ORAL_TABLET | Freq: Every day | ORAL | Status: DC

## 2014-01-15 ENCOUNTER — Ambulatory Visit: Payer: Self-pay | Admitting: Internal Medicine

## 2014-01-24 ENCOUNTER — Telehealth: Payer: Self-pay | Admitting: *Deleted

## 2014-01-24 NOTE — Telephone Encounter (Signed)
LVM 11/10

## 2014-01-24 NOTE — Telephone Encounter (Signed)
Patient called and having symptoms of sob, very tired and swelling legs and ankles. Please call.

## 2014-01-25 ENCOUNTER — Encounter: Payer: Self-pay | Admitting: Cardiovascular Disease

## 2014-01-25 ENCOUNTER — Ambulatory Visit (INDEPENDENT_AMBULATORY_CARE_PROVIDER_SITE_OTHER): Payer: Medicare Other | Admitting: Cardiovascular Disease

## 2014-01-25 VITALS — BP 104/66 | HR 58 | Ht 66.0 in | Wt 185.5 lb

## 2014-01-25 DIAGNOSIS — I472 Ventricular tachycardia: Secondary | ICD-10-CM

## 2014-01-25 DIAGNOSIS — R55 Syncope and collapse: Secondary | ICD-10-CM

## 2014-01-25 DIAGNOSIS — I4729 Other ventricular tachycardia: Secondary | ICD-10-CM

## 2014-01-25 DIAGNOSIS — E782 Mixed hyperlipidemia: Secondary | ICD-10-CM | POA: Insufficient documentation

## 2014-01-25 DIAGNOSIS — R5382 Chronic fatigue, unspecified: Secondary | ICD-10-CM

## 2014-01-25 DIAGNOSIS — R002 Palpitations: Secondary | ICD-10-CM

## 2014-01-25 DIAGNOSIS — R079 Chest pain, unspecified: Secondary | ICD-10-CM

## 2014-01-25 DIAGNOSIS — I739 Peripheral vascular disease, unspecified: Secondary | ICD-10-CM

## 2014-01-25 DIAGNOSIS — I2510 Atherosclerotic heart disease of native coronary artery without angina pectoris: Secondary | ICD-10-CM

## 2014-01-25 DIAGNOSIS — E785 Hyperlipidemia, unspecified: Secondary | ICD-10-CM

## 2014-01-25 DIAGNOSIS — R0602 Shortness of breath: Secondary | ICD-10-CM

## 2014-01-25 MED ORDER — PROPRANOLOL HCL 10 MG PO TABS: 10.0000 mg | ORAL_TABLET | Freq: Three times a day (TID) | ORAL | Status: DC | PRN

## 2014-01-25 NOTE — Assessment & Plan Note (Signed)
No recent symptoms of syncope or near syncope.

## 2014-01-25 NOTE — Assessment & Plan Note (Signed)
Previous history of nonsustained VT, PVCs. She continues to have palpitations and tachycardia. She is requesting 30 day monitor. She did not tolerate metoprolol. 30 day monitor has been ordered for further evaluation of arrhythmia. We will also give her propranolol to take as needed, 10 mg

## 2014-01-25 NOTE — Telephone Encounter (Signed)
Pt called back yesterday, 5:11 pm and left vm, states she was returning the nurses call.

## 2014-01-25 NOTE — Patient Instructions (Addendum)
Your next appointment will be scheduled in our new office located at :  [facility]  [address], [address]  [place], [place] [address]  You are doing well. We will order a 30 day monitor for arrhythmia, previous NSVT, PVCs We will call you with the results  Take propranolol as needed for arrhythmia  Please call us if you have new issues that need to be addressed before your next appt.  Your physician wants you to follow-up in: 6 months.  You will receive a reminder letter in the mail two months in advance. If you don't receive a letter, please call our office to schedule the follow-up appointment.

## 2014-01-25 NOTE — Assessment & Plan Note (Signed)
Plaquing noted in the aorta. We'll continue aggressive cholesterol management

## 2014-01-25 NOTE — Assessment & Plan Note (Signed)
Continued insomnia, fatigue, always tired. Suspect this is from poor sleep hygiene

## 2014-01-25 NOTE — Telephone Encounter (Signed)
Left message for pt to call back  °

## 2014-01-25 NOTE — Telephone Encounter (Signed)
Spoke w/ pt.  She reports SOB, edema and palpitations.  Offered her appt to see Dr. Mariah MillingGollan this afternoon, as she has not been to school in the past 2 days.  She is agreeable to this.

## 2014-01-25 NOTE — Assessment & Plan Note (Signed)
Coronary disease seen on CT scan. We'll continue aggressive lipid management No symptoms concerning for angina
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
## 2014-01-25 NOTE — Assessment & Plan Note (Signed)
Suggested she stay on her low-dose Lipitor. Goal LDL less than 70. Recheck her cholesterol in 3 months time

## 2014-01-27 DIAGNOSIS — R002 Palpitations: Secondary | ICD-10-CM

## 2014-03-14 ENCOUNTER — Telehealth: Payer: Self-pay

## 2014-03-14 NOTE — Telephone Encounter (Signed)
Received results of pt's 30 day event monitor: "NSR, rare PVC". Left message w/ pt's results.  Asked her to call back w/ any questions or concerns.

## 2014-03-17 DIAGNOSIS — B084 Enteroviral vesicular stomatitis with exanthem: Secondary | ICD-10-CM

## 2014-03-17 HISTORY — DX: Enteroviral vesicular stomatitis with exanthem: B08.4

## 2014-03-22 ENCOUNTER — Other Ambulatory Visit: Payer: Self-pay

## 2014-03-22 ENCOUNTER — Ambulatory Visit (INDEPENDENT_AMBULATORY_CARE_PROVIDER_SITE_OTHER): Payer: Medicare Other

## 2014-03-22 DIAGNOSIS — I472 Ventricular tachycardia: Secondary | ICD-10-CM

## 2014-03-22 DIAGNOSIS — I4729 Other ventricular tachycardia: Secondary | ICD-10-CM

## 2014-03-22 DIAGNOSIS — R002 Palpitations: Secondary | ICD-10-CM

## 2014-03-22 DIAGNOSIS — I4581 Long QT syndrome: Secondary | ICD-10-CM

## 2014-03-22 DIAGNOSIS — R0602 Shortness of breath: Secondary | ICD-10-CM

## 2014-03-22 DIAGNOSIS — R079 Chest pain, unspecified: Secondary | ICD-10-CM

## 2014-06-20 ENCOUNTER — Ambulatory Visit
Admit: 2014-06-20 | Disposition: A | Discharge status: Home / Self Care | Payer: Self-pay | Attending: Internal Medicine | Admitting: Internal Medicine

## 2014-07-03 ENCOUNTER — Ambulatory Visit
Admit: 2014-07-03 | Disposition: A | Discharge status: Home / Self Care | Payer: Self-pay | Attending: Family Medicine | Admitting: Family Medicine

## 2014-08-30 ENCOUNTER — Telehealth: Payer: Self-pay | Admitting: Pain Medicine

## 2014-08-30 NOTE — Telephone Encounter (Signed)
Left pt vm to sch apt with dr crisp

## 2014-09-13 ENCOUNTER — Other Ambulatory Visit: Payer: Medicare Other

## 2014-09-13 ENCOUNTER — Ambulatory Visit: Payer: Medicare Other

## 2014-09-13 ENCOUNTER — Ambulatory Visit: Payer: Medicare Other | Admitting: Internal Medicine

## 2014-09-14 ENCOUNTER — Encounter: Payer: Self-pay | Admitting: Pain Medicine

## 2014-09-14 ENCOUNTER — Ambulatory Visit: Payer: Medicare Other | Attending: Pain Medicine | Admitting: Pain Medicine

## 2014-09-14 VITALS — BP 130/73 | HR 65 | Temp 98.1°F | Resp 18 | Ht 65.0 in | Wt 185.0 lb

## 2014-09-14 DIAGNOSIS — M79604 Pain in right leg: Secondary | ICD-10-CM | POA: Diagnosis present

## 2014-09-14 DIAGNOSIS — F410 Panic disorder [episodic paroxysmal anxiety] without agoraphobia: Secondary | ICD-10-CM | POA: Insufficient documentation

## 2014-09-14 DIAGNOSIS — M5135 Other intervertebral disc degeneration, thoracolumbar region: Secondary | ICD-10-CM | POA: Insufficient documentation

## 2014-09-14 DIAGNOSIS — G588 Other specified mononeuropathies: Secondary | ICD-10-CM | POA: Insufficient documentation

## 2014-09-14 DIAGNOSIS — M5134 Other intervertebral disc degeneration, thoracic region: Secondary | ICD-10-CM | POA: Insufficient documentation

## 2014-09-14 DIAGNOSIS — M545 Low back pain: Secondary | ICD-10-CM | POA: Diagnosis present

## 2014-09-14 DIAGNOSIS — M5136 Other intervertebral disc degeneration, lumbar region: Secondary | ICD-10-CM | POA: Insufficient documentation

## 2014-09-14 DIAGNOSIS — M533 Sacrococcygeal disorders, not elsewhere classified: Secondary | ICD-10-CM | POA: Diagnosis not present

## 2014-09-14 DIAGNOSIS — J45909 Unspecified asthma, uncomplicated: Secondary | ICD-10-CM | POA: Diagnosis not present

## 2014-09-14 DIAGNOSIS — M47816 Spondylosis without myelopathy or radiculopathy, lumbar region: Secondary | ICD-10-CM

## 2014-09-14 DIAGNOSIS — M47814 Spondylosis without myelopathy or radiculopathy, thoracic region: Secondary | ICD-10-CM

## 2014-09-14 DIAGNOSIS — M79605 Pain in left leg: Secondary | ICD-10-CM | POA: Diagnosis present

## 2014-09-14 DIAGNOSIS — M47894 Other spondylosis, thoracic region: Secondary | ICD-10-CM | POA: Insufficient documentation
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
## 2014-09-14 NOTE — Progress Notes (Signed)
Safety precautions to be maintained throughout the outpatient stay will include: orient to surroundings, keep bed in low position, maintain call bell within reach at all times, provide assistance with transfer out of bed and ambulation.  

## 2014-09-14 NOTE — Patient Instructions (Addendum)
Continue present medications methadone and Neurontin  Lumbar facet, medial branch nerve, blocks to be performed Monday, 10/02/2014  F/U PCP for evaliation of  BP and general medical  Condition  Please address nurses and receptionist the dates for your lumbar and thoracic MRIs  F/U surgical evaluation  F/U neurological evaluation  May consider radiofrequency rhizolysis or intraspinal procedures pending response to present treatment and F/U evaluation.  Patient to call Pain Management Center should patient have concerns prior to scheduled return appointment. Facet Blocks Patient Information  Description: The facets are joints in the spine between the vertebrae.  Like any joints in the body, facets can become irritated and painful.  Arthritis can also effect the facets.  By injecting steroids and local anesthetic in and around these joints, we can temporarily block the nerve supply to them.  Steroids act directly on irritated nerves and tissues to reduce selling and inflammation which often leads to decreased pain.  Facet blocks may be done anywhere along the spine from the neck to the low back depending upon the location of your pain.   After numbing the skin with local anesthetic (like Novocaine), a small needle is passed onto the facet joints under x-ray guidance.  You may experience a sensation of pressure while this is being done.  The entire block usually lasts about 15-25 minutes.   Conditions which may be treated by facet blocks:   Low back/buttock pain  Neck/shoulder pain  Certain types of headaches  Preparation for the injection:  1. Do not eat any solid food or dairy products within 6 hours of your appointment. 2. You may drink clear liquid up to 2 hours before appointment.  Clear liquids include water, black coffee, juice or soda.  No milk or cream please. 3. You may take your regular medication, including pain medications, with a sip of water before your appointment.   Diabetics should hold regular insulin (if taken separately) and take 1/2 normal NPH dose the morning of the procedure.  Carry some sugar containing items with you to your appointment. 4. A driver must accompany you and be prepared to drive you home after your procedure. 5. Bring all your current medications with you. 6. An IV may be inserted and sedation may be given at the discretion of the physician. 7. A blood pressure cuff, EKG and other monitors will often be applied during the procedure.  Some patients may need to have extra oxygen administered for a short period. 8. You will be asked to provide medical information, including your allergies and medications, prior to the procedure.  We must know immediately if you are taking blood thinners (like Coumadin/Warfarin) or if you are allergic to IV iodine contrast (dye).  We must know if you could possible be pregnant.  Possible side-effects:   Bleeding from needle site  Infection (rare, may require surgery)  Nerve injury (rare)  Numbness & tingling (temporary)  Difficulty urinating (rare, temporary)  Spinal headache (a headache worse with upright posture)  Light-headedness (temporary)  Pain at injection site (serveral days)  Decreased blood pressure (rare, temporary)  Weakness in arm/leg (temporary)  Pressure sensation in back/neck (temporary)   Call if you experience:   Fever/chills associated with headache or increased back/neck pain  Headache worsened by an upright position  New onset, weakness or numbness of an extremity below the injection site  Hives or difficulty breathing (go to the emergency room)  Inflammation or drainage at the injection site(s)  Severe back/neck pain greater  than usual  New symptoms which are concerning to you  Please note:  Although the local anesthetic injected can often make your back or neck feel good for several hours after the injection, the pain will likely return. It takes 3-7  days for steroids to work.  You may not notice any pain relief for at least one week.  If effective, we will often do a series of 2-3 injections spaced 3-6 weeks apart to maximally decrease your pain.  After the initial series, you may be a candidate for a more permanent nerve block of the facets.  If you have any questions, please call #[phone] [facility]

## 2014-09-19 ENCOUNTER — Inpatient Hospital Stay: Payer: Medicare Other

## 2014-09-19 ENCOUNTER — Inpatient Hospital Stay (HOSPITAL_BASED_OUTPATIENT_CLINIC_OR_DEPARTMENT_OTHER): Payer: Medicare Other | Admitting: Internal Medicine

## 2014-09-19 ENCOUNTER — Inpatient Hospital Stay: Payer: Medicare Other | Attending: Internal Medicine

## 2014-09-19 VITALS — BP 114/77 | HR 55 | Temp 96.4°F | Resp 18 | Ht 65.0 in | Wt 194.4 lb

## 2014-09-19 DIAGNOSIS — R5383 Other fatigue: Secondary | ICD-10-CM

## 2014-09-19 DIAGNOSIS — D509 Iron deficiency anemia, unspecified: Secondary | ICD-10-CM | POA: Insufficient documentation

## 2014-09-19 DIAGNOSIS — L409 Psoriasis, unspecified: Secondary | ICD-10-CM

## 2014-09-19 DIAGNOSIS — M129 Arthropathy, unspecified: Secondary | ICD-10-CM | POA: Diagnosis not present

## 2014-09-19 DIAGNOSIS — Z9884 Bariatric surgery status: Secondary | ICD-10-CM | POA: Insufficient documentation

## 2014-09-19 DIAGNOSIS — F329 Major depressive disorder, single episode, unspecified: Secondary | ICD-10-CM | POA: Diagnosis not present

## 2014-09-19 DIAGNOSIS — Z8669 Personal history of other diseases of the nervous system and sense organs: Secondary | ICD-10-CM

## 2014-09-19 DIAGNOSIS — G8929 Other chronic pain: Secondary | ICD-10-CM | POA: Insufficient documentation

## 2014-09-19 DIAGNOSIS — Z79899 Other long term (current) drug therapy: Secondary | ICD-10-CM | POA: Diagnosis not present

## 2014-09-19 DIAGNOSIS — M1990 Unspecified osteoarthritis, unspecified site: Secondary | ICD-10-CM | POA: Diagnosis not present

## 2014-09-19 DIAGNOSIS — J45909 Unspecified asthma, uncomplicated: Secondary | ICD-10-CM | POA: Diagnosis not present

## 2014-09-19 DIAGNOSIS — R011 Cardiac murmur, unspecified: Secondary | ICD-10-CM | POA: Insufficient documentation

## 2014-09-19 DIAGNOSIS — E669 Obesity, unspecified: Secondary | ICD-10-CM

## 2014-09-19 DIAGNOSIS — E538 Deficiency of other specified B group vitamins: Secondary | ICD-10-CM | POA: Diagnosis not present

## 2014-09-19 DIAGNOSIS — E559 Vitamin D deficiency, unspecified: Secondary | ICD-10-CM | POA: Insufficient documentation

## 2014-09-19 LAB — IRON AND TIBC
Iron: 76 ug/dL (ref 28–170)
Saturation Ratios: 28 % (ref 10.4–31.8)
TIBC: 275 ug/dL (ref 250–450)
UIBC: 199 ug/dL

## 2014-09-19 LAB — CBC WITH DIFFERENTIAL/PLATELET
BASOS PCT: 1 %
Basophils Absolute: 0.1 10*3/uL (ref 0–0.1)
EOS ABS: 0.3 10*3/uL (ref 0–0.7)
Eosinophils Relative: 4 %
HCT: 36.6 % (ref 35.0–47.0)
Hemoglobin: 12.2 g/dL (ref 12.0–16.0)
Lymphocytes Relative: 41 %
Lymphs Abs: 3.2 10*3/uL (ref 1.0–3.6)
MCH: 30.1 pg (ref 26.0–34.0)
MCHC: 33.3 g/dL (ref 32.0–36.0)
MCV: 90.3 fL (ref 80.0–100.0)
MONO ABS: 0.8 10*3/uL (ref 0.2–0.9)
Monocytes Relative: 11 %
NEUTROS PCT: 43 %
Neutro Abs: 3.4 10*3/uL (ref 1.4–6.5)
PLATELETS: 204 10*3/uL (ref 150–440)
RBC: 4.06 MIL/uL (ref 3.80–5.20)
RDW: 12.9 % (ref 11.5–14.5)
WBC: 7.8 10*3/uL (ref 3.6–11.0)

## 2014-09-19 LAB — FERRITIN: Ferritin: 45 ng/mL (ref 11–307)

## 2014-09-19 MED ORDER — SODIUM CHLORIDE 0.9 % IV SOLN
100.0000 mg | Freq: Once | INTRAVENOUS | Status: AC
  Administered 2014-09-19: 100 mg via INTRAVENOUS
  Filled 2014-09-19: qty 5

## 2014-09-21 ENCOUNTER — Telehealth: Payer: Self-pay | Admitting: Pain Medicine

## 2014-09-21 ENCOUNTER — Ambulatory Visit: Payer: Medicare Other

## 2014-09-21 NOTE — Telephone Encounter (Signed)
Lou from MRI called to let us know Ms Cheyenne Gray did not show up for her MRI today.

## 2014-09-27 ENCOUNTER — Other Ambulatory Visit: Payer: Self-pay | Admitting: Pain Medicine

## 2014-09-28 NOTE — Progress Notes (Signed)
[facility]  [phone] [phone]) [phone]     ID: Cheyenne Gray OB: [birth year]  MR#: [id number]  QMV#:[id number]  Patient Care Team: Emogene Morgan, MD as PCP - General (Family Medicine) Emogene Morgan, MD as Referring Physician (Family Medicine)  CHIEF COMPLAINT/DIAGNOSIS:  Iron-deficiency anemia. Intolerant to oral iron supplementation. Also has a history of gastric bypass in 2001.  On parenteral iron therapy for recurrent iron deficiency.   HISTORY OF PRESENT ILLNESS:  Patient returns for continued hematology followup. She was seen in Oct 2015. She received her maintenance Venofer treatment in April and  November of 2014, and in Oct 2015. Currently states that she has fatigue on exertion, otherwise remains physically activity. Otherwise, no new dyspnea, palpitation, angina, dizziness, orthopnea, or PND. Denies any new bleeding symptoms including bright red blood in stools or hematuria. States that sometimes menstrual periods are heavier but otherwise mostly regular. No new bone pains. Appetite is good, no unintentional weight loss. No new paresthesias in extremities.   REVIEW OF SYSTEMS:   ROS As in HPI above. In addition, no fevers or sweats. No new headaches or focal weakness.  No new sore throat, cough, sputum, hemoptysis or chest pain. No dizziness or palpitation. No abdominal pain, constipation, diarrhea, dysuria or hematuria. No new skin rash or bleeding symptoms. No new paresthesias in extremities.    PAST MEDICAL HISTORY: Reviewed. Past Medical History  Diagnosis Date  . Migraines   . Chronic pain   . DJD (degenerative joint disease)   . Iron deficiency anemia   . Long QT interval   . Syncope and collapse   . Iron deficiency   . Heart murmur     as child  . Arthritis   . Asthma   . Obesity   . Migraine   . Psoriasis   . Chronic back pain   . DJD (degenerative joint disease), multiple sites   . B12 deficiency   . Vitamin D deficiency     . Depression   . History of shingles     PAST SURGICAL HISTORY: Reviewed. Past Surgical History  Procedure Laterality Date  . Bariatric surgery  2001  . Cholecystectomy  2001  . Gastroplasty    . Gastric bypass    . Gallbladder surgery      FAMILY HISTORY: Reviewed. Family History  Problem Relation Age of Onset  . Heart attack Mother   . Cancer Mother   . Heart attack Father 90    MI  . Heart attack Brother   . Heart disease Brother   . Heart attack Maternal Grandmother   . Cancer Maternal Grandmother   . Cancer Maternal Uncle     SOCIAL HISTORY: Reviewed. History  Substance Use Topics  . Smoking status: Never Smoker   . Smokeless tobacco: Not on file  . Alcohol Use: Yes    Allergies  Allergen Reactions  . Penicillins     REACTION: Rash, Hives, S.O.B.    Current Outpatient Prescriptions  Medication Sig Dispense Refill  . albuterol (PROVENTIL HFA;VENTOLIN HFA) 108 (90 BASE) MCG/ACT inhaler Inhale 2 puffs into the lungs as needed for wheezing.    Marland Kitchen atorvastatin (LIPITOR) 10 MG tablet Take 1 tablet (10 mg total) by mouth daily. 90 tablet 3  . cyclobenzaprine (FLEXERIL) 10 MG tablet Take 10 mg by mouth at bedtime as needed for muscle spasms.    Marland Kitchen gabapentin (NEURONTIN) 400 MG capsule Takes 2 tablet am and 2 tablets noon and 3  pm daily.    . methadone (DOLOPHINE) 10 MG tablet Take 20 mg by mouth 3 (three) times daily as needed for pain.    Marland Kitchen. propranolol (INDERAL) 10 MG tablet Take 1 tablet (10 mg total) by mouth 3 (three) times daily as needed. 90 tablet 6  . SUMAtriptan (IMITREX) 25 MG tablet Take 25 mg by mouth every 2 (two) hours as needed for migraine. May repeat in 2 hours if headache persists or recurs.     No current facility-administered medications for this visit.    PHYSICAL EXAM: Filed Vitals:   09/19/14 0958  BP: 114/77  Pulse: 55  Temp: 96.4 F (35.8 C)  Resp: 18     Body mass index is 32.36 kg/(m^2).       GENERAL: Patient is alert and  oriented and in no acute distress. There is no icterus or pallor. HEENT: EOMs intact. No cervical lymphadenopathy. CVS: S1S2, regular LUNGS: Bilaterally clear to auscultation, no rhonchi. ABDOMEN: Soft, nontender.   EXTREMITIES: No pedal edema.   LAB RESULTS:  Serum Fe 76, TIBC 275, iron sat 28%, ferritin 45.    Component Value Date/Time   PROT 6.4 05/04/2013 1556   ALBUMIN 3.1* 05/04/2013 1556   AST 38* 05/04/2013 1556   ALT 39 05/04/2013 1556   ALKPHOS 84 05/04/2013 1556   Lab Results  Component Value Date   WBC 7.8 09/19/2014   NEUTROABS 3.4 09/19/2014   HGB 12.2 09/19/2014   HCT 36.6 09/19/2014   MCV 90.3 09/19/2014   PLT 204 09/19/2014     ASSESSMENT / PLAN:   Recurrent iron-deficiency anemia. Intolerant to oral iron supplementation. Also has a history of gastric bypass in 2001.  On parenteral iron therapy intermittently since 2010 -  Reviewed labs from today and d/w patient. Patient clinically doing steady except for fatigue on physical exertion. She has been on maintenance dose of IV Venofer 100 mg. Labs shows Hb remains normal and iron study is in the low normal range. Plan is to continue on maintenance dose of Venofer 100 mg IV once every 12 weeks. Will monitor CBC and iron study q 12 weeks, next MD f/u at 48 weeks. Have explained that she will need large doses of IV Venofer if she develops recurrent iron deficiency. In between visits, she was advised to call or come to ER in case of any progressive anemia symptoms or acute sickness. She is agreeable to this plan.     Janese BanksSandeep Math Brazie, MD   09/28/2014 4:54 PM

## 2014-10-02 ENCOUNTER — Ambulatory Visit
Admission: RE | Admit: 2014-10-02 | Discharge: 2014-10-02 | Disposition: A | Discharge status: Home / Self Care | Payer: Medicare Other | Source: Ambulatory Visit | Attending: Pain Medicine | Admitting: Pain Medicine

## 2014-10-02 DIAGNOSIS — M5126 Other intervertebral disc displacement, lumbar region: Secondary | ICD-10-CM | POA: Insufficient documentation

## 2014-10-02 DIAGNOSIS — M47814 Spondylosis without myelopathy or radiculopathy, thoracic region: Secondary | ICD-10-CM

## 2014-10-02 DIAGNOSIS — W19XXXS Unspecified fall, sequela: Secondary | ICD-10-CM | POA: Insufficient documentation

## 2014-10-02 DIAGNOSIS — M2578 Osteophyte, vertebrae: Secondary | ICD-10-CM | POA: Insufficient documentation

## 2014-10-02 DIAGNOSIS — M4806 Spinal stenosis, lumbar region: Secondary | ICD-10-CM | POA: Diagnosis not present

## 2014-10-02 DIAGNOSIS — M47894 Other spondylosis, thoracic region: Secondary | ICD-10-CM

## 2014-10-02 DIAGNOSIS — M5127 Other intervertebral disc displacement, lumbosacral region: Secondary | ICD-10-CM | POA: Insufficient documentation

## 2014-10-02 DIAGNOSIS — M47816 Spondylosis without myelopathy or radiculopathy, lumbar region: Secondary | ICD-10-CM

## 2014-10-02 DIAGNOSIS — M5134 Other intervertebral disc degeneration, thoracic region: Secondary | ICD-10-CM

## 2014-10-02 DIAGNOSIS — M5136 Other intervertebral disc degeneration, lumbar region: Secondary | ICD-10-CM

## 2014-10-02 DIAGNOSIS — M533 Sacrococcygeal disorders, not elsewhere classified: Secondary | ICD-10-CM

## 2014-10-02 DIAGNOSIS — M47897 Other spondylosis, lumbosacral region: Secondary | ICD-10-CM | POA: Insufficient documentation

## 2014-10-02 DIAGNOSIS — M546 Pain in thoracic spine: Secondary | ICD-10-CM | POA: Diagnosis present

## 2014-10-02 DIAGNOSIS — M51369 Other intervertebral disc degeneration, lumbar region without mention of lumbar back pain or lower extremity pain: Secondary | ICD-10-CM

## 2014-10-03 NOTE — Progress Notes (Signed)
Patient has appt for Wednesday.

## 2014-10-04 ENCOUNTER — Encounter: Payer: Self-pay | Admitting: Pain Medicine

## 2014-10-04 ENCOUNTER — Ambulatory Visit: Payer: Medicare Other | Attending: Pain Medicine | Admitting: Pain Medicine

## 2014-10-04 VITALS — BP 116/74 | HR 72 | Temp 97.8°F | Resp 16 | Ht 66.0 in | Wt 180.0 lb

## 2014-10-04 DIAGNOSIS — M5134 Other intervertebral disc degeneration, thoracic region: Secondary | ICD-10-CM

## 2014-10-04 DIAGNOSIS — M5136 Other intervertebral disc degeneration, lumbar region: Secondary | ICD-10-CM

## 2014-10-04 DIAGNOSIS — M47816 Spondylosis without myelopathy or radiculopathy, lumbar region: Secondary | ICD-10-CM | POA: Insufficient documentation

## 2014-10-04 DIAGNOSIS — M47894 Other spondylosis, thoracic region: Secondary | ICD-10-CM

## 2014-10-04 DIAGNOSIS — M533 Sacrococcygeal disorders, not elsewhere classified: Secondary | ICD-10-CM

## 2014-10-04 DIAGNOSIS — M79604 Pain in right leg: Secondary | ICD-10-CM | POA: Diagnosis present

## 2014-10-04 DIAGNOSIS — M4806 Spinal stenosis, lumbar region: Secondary | ICD-10-CM | POA: Diagnosis not present

## 2014-10-04 DIAGNOSIS — M5126 Other intervertebral disc displacement, lumbar region: Secondary | ICD-10-CM | POA: Diagnosis not present

## 2014-10-04 DIAGNOSIS — M79605 Pain in left leg: Secondary | ICD-10-CM | POA: Diagnosis present

## 2014-10-04 DIAGNOSIS — M47814 Spondylosis without myelopathy or radiculopathy, thoracic region: Secondary | ICD-10-CM

## 2014-10-04 DIAGNOSIS — M545 Low back pain: Secondary | ICD-10-CM | POA: Diagnosis present

## 2014-10-04 MED ORDER — FENTANYL CITRATE (PF) 100 MCG/2ML IJ SOLN
INTRAMUSCULAR | Status: AC
  Administered 2014-10-04: 100 ug via INTRAVENOUS
  Filled 2014-10-04: qty 2

## 2014-10-04 MED ORDER — MIDAZOLAM HCL 5 MG/5ML IJ SOLN
INTRAMUSCULAR | Status: AC
  Administered 2014-10-04: 5 mg via INTRAVENOUS
  Filled 2014-10-04: qty 5

## 2014-10-04 MED ORDER — BUPIVACAINE HCL (PF) 0.25 % IJ SOLN
INTRAMUSCULAR | Status: AC
  Administered 2014-10-04: 30 mL
  Filled 2014-10-04: qty 30

## 2014-10-04 MED ORDER — TRIAMCINOLONE ACETONIDE 40 MG/ML IJ SUSP
INTRAMUSCULAR | Status: AC
  Administered 2014-10-04: 40 mg
  Filled 2014-10-04: qty 1

## 2014-10-04 MED ORDER — ORPHENADRINE CITRATE 30 MG/ML IJ SOLN
INTRAMUSCULAR | Status: AC
  Filled 2014-10-04: qty 2

## 2014-10-04 NOTE — Progress Notes (Signed)
Safety precautions to be maintained throughout the outpatient stay will include: orient to surroundings, keep bed in low position, maintain call bell within reach at all times, provide assistance with transfer out of bed and ambulation.  

## 2014-10-04 NOTE — Patient Instructions (Addendum)
Continue present medications  F/U PCP Dr.Aycock   for evaliation of  BP and general medical  condition.  F/U surgical evaluation  F/U neurological evaluation  May consider radiofrequency rhizolysis or intraspinal procedures pending response to present treatment and F/U evaluation.  Patient to call Pain Management Center should patient have concerns prior to scheduled return appointment. Pain Management Discharge Instructions  General Discharge Instructions :  If you need to reach your doctor call: Monday-Friday 8:00 am - 4:00 pm at [phone] or toll free [phone].  After clinic hours [phone] to have operator reach doctor.  Bring all of your medication bottles to all your appointments in the pain clinic.  To cancel or reschedule your appointment with Pain Management please remember to call 24 hours in advance to avoid a fee.  Refer to the educational materials which you have been given on: General Risks, I had my Procedure. Discharge Instructions, Post Sedation.  Post Procedure Instructions:  The drugs you were given will stay in your system until tomorrow, so for the next 24 hours you should not drive, make any legal decisions or drink any alcoholic beverages.  You may eat anything you prefer, but it is better to start with liquids then soups and crackers, and gradually work up to solid foods.  Please notify your doctor immediately if you have any unusual bleeding, trouble breathing or pain that is not related to your normal pain.  Depending on the type of procedure that was done, some parts of your body may feel week and/or numb.  This usually clears up by tonight or the next day.  Walk with the use of an assistive device or accompanied by an adult for the 24 hours.  You may use ice on the affected area for the first 24 hours.  Put ice in a Ziploc bag and cover with a towel and place against area 15 minutes on 15 minutes off.  You may switch to heat after 24  hours.Facet Joint Block The facet joints connect the bones of the spine (vertebrae). They make it possible for you to bend, twist, and make other movements with your spine. They also prevent you from overbending, overtwisting, and making other excessive movements.  A facet joint block is a procedure where a numbing medicine (anesthetic) is injected into a facet joint. Often, a type of anti-inflammatory medicine called a steroid is also injected. A facet joint block may be done for two reasons:   Diagnosis. A facet joint block may be done as a test to see whether neck or back pain is caused by a worn-down or infected facet joint. If the pain gets better after a facet joint block, it means the pain is probably coming from the facet joint. If the pain does not get better, it means the pain is probably not coming from the facet joint.   Therapy. A facet joint block may be done to relieve neck or back pain caused by a facet joint. A facet joint block is only done as a therapy if the pain does not improve with medicine, exercise programs, physical therapy, and other forms of pain management. LET [facility] PROVIDER KNOW ABOUT:   Any allergies you have.   All medicines you are taking, including vitamins, herbs, eyedrops, and over-the-counter medicines and creams.   Previous problems you or members of your family have had with the use of anesthetics.   Any blood disorders you have had.   Other health problems you have. RISKS  AND COMPLICATIONS Generally, having a facet joint block is safe. However, as with any procedure, complications can occur. Possible complications associated with having a facet joint block include:   Bleeding.   Injury to a nerve near the injection site.   Pain at the injection site.   Weakness or numbness in areas controlled by nerves near the injection site.   Infection.   Temporary fluid retention.   Allergic reaction to anesthetics or medicines used  during the procedure. BEFORE THE PROCEDURE   Follow your health care provider's instructions if you are taking dietary supplements or medicines. You may need to stop taking them or reduce your dosage.   Do not take any new dietary supplements or medicines without asking your health care provider first.   Follow your health care provider's instructions about eating and drinking before the procedure. You may need to stop eating and drinking several hours before the procedure.   Arrange to have an adult drive you home after the procedure. PROCEDURE  You may need to remove your clothing and dress in an open-back gown so that your health care provider can access your spine.   The procedure will be done while you are lying on an X-ray table. Most of the time you will be asked to lie on your stomach, but you may be asked to lie in a different position if an injection will be made in your neck.   Special machines will be used to monitor your oxygen levels, heart rate, and blood pressure.   If an injection will be made in your neck, an intravenous (IV) tube will be inserted into one of your veins. Fluids and medicine will flow directly into your body through the IV tube.   The area over the facet joint where the injection will be made will be cleaned with an antiseptic soap. The surrounding skin will be covered with sterile drapes.   An anesthetic will be applied to your skin to make the injection area numb. You may feel a temporary stinging or burning sensation.   A video X-ray machine will be used to locate the joint. A contrast dye may be injected into the facet joint area to help with locating the joint.   When the joint is located, an anesthetic medicine will be injected into the joint through the needle.   Your health care provider will ask you whether you feel pain relief. If you do feel relief, a steroid may be injected to provide pain relief for a longer period of time. If you  do not feel relief or feel only partial relief, additional injections of an anesthetic may be made in other facet joints.   The needle will be removed, the skin will be cleansed, and bandages will be applied.  AFTER THE PROCEDURE   You will be observed for 15-30 minutes before being allowed to go home. Do not drive. Have an adult drive you or take a taxi or public transportation instead.   If you feel pain relief, the pain will return in several hours or days when the anesthetic wears off.   You may feel pain relief 2-14 days after the procedure. The amount of time this relief lasts varies from person to person.   It is normal to feel some tenderness over the injected area(s) for 2 days following the procedure.   If you have diabetes, you may have a temporary increase in blood sugar. Document Released: 07/23/2006 Document Revised: 07/18/2013 Document Reviewed: 12/22/2011  [employer] Patient Information 2015 [employer]. This information is not intended to replace advice given to you by your health care provider. Make sure you discuss any questions you have with your health care provider.  

## 2014-10-04 NOTE — Progress Notes (Signed)
Subjective:    Patient ID: Cheyenne Gray, female    DOB: [birth year], 49 y.o.   MRN: [id number]  HPI  PROCEDURE PERFORMED: Lumbar facet (medial branch block)   NOTE: The patient is a 49 y.o. female who [facility] and treatment of pain involving the lumbar and lower extremity region. MRI  revealed the patient to be with evidence of degenerative changes lumbar spine with L3-4 multifactorial moderate to marked spinal stenosis, L4-5 bulge with central disc protrusion slightly greater on the left. Facet degenerative changes L4-5. L5-S1 mild bulge and osteophyte with foraminal/lateral extension slightly greater on the left. Mild encroachment upon but not compression of the exiting L5 nerve roots. Facet joint degenerative changes. Thoracic MRI revealed patient to be with minimal degenerative changes without significant thoracic disc herniation or thoracic cord compression. There is concern regarding patient being with significant component of pain due to facet degenerative changes with facet syndrome in addition to the other abnormalities noted. The risks, benefits, and expectations of the procedure have been discussed and explained to the patient who was understanding and in agreement with suggested treatment plan. We will proceed with interventional treatment as discussed and as explained to the patient who was understanding and wished to proceed with procedure as planned.   DESCRIPTION OF PROCEDURE: Lumbar facet (medial branch block) with IV Versed, IV fentanyl conscious sedation, EKG, blood pressure, pulse, and pulse oximetry monitoring. The procedure was performed with the patient in the prone position. Betadine prep of proposed entry site performed.   NEEDLE PLACEMENT AT: Left L 3 lumbar facet (medial branch block). Under fluoroscopic guidance with oblique orientation of 15 degrees, a 22-gauge needle was inserted at the L 3 vertebral body level with needle  placed at the targeted area of Burton's Eye or Eye of the Scotty Dog with documentation of needle placement in the superior and lateral border of targeted area of Burton's Eye or Eye of the Scotty Dog with oblique orientation of 15 degrees. Following documentation of needle placement at the L 3 vertebral body level, needle placement was then accomplished at the L 4 vertebral body level.   NEEDLE PLACEMENT AT L4 and L5 VERTEBRAL BODY LEVELS ON THE LEFT SIDE The procedure was performed at the L4 and L5 vertebral body levels exactly as was performed at the L 3 vertebral body level utilizing the same technique and under fluoroscopic guidance.  NEEDLE PLACEMENT AT THE SACRAL ALA with AP view of the lumbosacral spine. With the patient in the prone position, Betadine prep of proposed entry site accomplished, a 22 gauge needle was inserted in the region of the sacral ala (groove formed by the superior articulating process of S1 and the sacral wing). Following documentation of needle placement at the sacral ala,  needle placement was then accomplished at the S1 foramen level.   NEEDLE PLACEMENT AT THE S1 FORAMEN LEVEL under fluoroscopic guidance with AP view of the lumbosacral spine and cephalad orientation of the fluoroscope, a 22-gauge needle was placed at the superior and lateral border of the S1 foramen under fluoroscopic guidance. Following documentation of needle placement at the S1 foramen.   Needle placement was then verified at all levels on lateral view. Following documentation of needle placement at all levels on lateral view and following negative aspiration for heme and CSF, each level was injected with 1 mL of 0.25% bupivacaine with Kenalog.     LUMBAR FACET, MEDIAL BRANCH NERVE, BLOCKS PERFORMED ON THE  RIGHT SIDE   The procedure was performed on the right side exactly as was performed on the left side at the same levels and utilizing the same technique under fluoroscopic guidance.     The  patient tolerated the procedure well. A total of 40 mg of Kenalog was utilized for the procedure.   PLAN:  1. Medications: The patient will continue presently prescribed medications. 2. May consider modification of treatment regimen at time of return appointment pending response to treatment rendered on today's visit. 3. The patient is to follow-up with primary care physician [facility]  for further evaluation of blood pressure and general medical condition status post steroid injection performed on today's visit. 4. Surgical follow-up evaluation. The patient is to undergo neurosurgical evaluation as discussed . 5. Neurological follow-up evaluation. 6. The patient may be candidate for radiofrequency procedures, implantation type procedures, and other treatment pending response to treatment and follow-up evaluation. 7. The patient has been advised to call the [facility] prior to scheduled return appointment should there be significant change in condition or should patient have other concerns regarding condition prior to scheduled return appointment.  The patient is understanding and in agreement with suggested treatment plan.     Review of Systems     Objective:   Physical Exam        Assessment & Plan:

## 2014-10-05 NOTE — Telephone Encounter (Signed)
Left msg

## 2014-10-11 ENCOUNTER — Telehealth: Payer: Self-pay | Admitting: Pain Medicine

## 2014-10-11 NOTE — Telephone Encounter (Signed)
Nurses Please see if we have permission to prescribe medications for pain And call patient to inform her of the process

## 2014-10-11 NOTE — Telephone Encounter (Signed)
Permission to prescribe sent to Dr. Letta Pate 10-11-14. Patient advised to get PCP to write one more month.

## 2014-10-11 NOTE — Telephone Encounter (Signed)
Pt meds run out 8/4 her next apt here is not until 10/31/14. Is dr crisp going to take over her meds or should she contact pcp to get a refill.

## 2014-10-11 NOTE — Telephone Encounter (Signed)
Thank you :)

## 2014-10-12 ENCOUNTER — Encounter: Payer: Self-pay | Admitting: Emergency Medicine

## 2014-10-12 ENCOUNTER — Emergency Department
Admission: EM | Admit: 2014-10-12 | Discharge: 2014-10-12 | Disposition: A | Discharge status: Home / Self Care | Payer: Medicare Other | Attending: Emergency Medicine | Admitting: Emergency Medicine

## 2014-10-12 ENCOUNTER — Telehealth: Payer: Self-pay

## 2014-10-12 DIAGNOSIS — L559 Sunburn, unspecified: Secondary | ICD-10-CM | POA: Diagnosis present

## 2014-10-12 DIAGNOSIS — Y998 Other external cause status: Secondary | ICD-10-CM | POA: Insufficient documentation

## 2014-10-12 DIAGNOSIS — Y9289 Other specified places as the place of occurrence of the external cause: Secondary | ICD-10-CM | POA: Diagnosis not present

## 2014-10-12 DIAGNOSIS — Y9389 Activity, other specified: Secondary | ICD-10-CM | POA: Diagnosis not present

## 2014-10-12 DIAGNOSIS — X32XXXA Exposure to sunlight, initial encounter: Secondary | ICD-10-CM | POA: Insufficient documentation

## 2014-10-12 DIAGNOSIS — L551 Sunburn of second degree: Secondary | ICD-10-CM | POA: Insufficient documentation

## 2014-10-12 DIAGNOSIS — F329 Major depressive disorder, single episode, unspecified: Secondary | ICD-10-CM | POA: Diagnosis not present

## 2014-10-12 MED ORDER — OXYCODONE-ACETAMINOPHEN 5-325 MG PO TABS
2.0000 | ORAL_TABLET | Freq: Once | ORAL | Status: AC
Start: 2014-10-12 — End: 2014-10-12
  Administered 2014-10-12: 2 via ORAL
  Filled 2014-10-12: qty 2

## 2014-10-12 MED ORDER — ALOE VESTA 2-N-1 PROTECTIVE EX OINT: TOPICAL_OINTMENT | Freq: Two times a day (BID) | CUTANEOUS | Status: DC | PRN

## 2014-10-12 MED ORDER — SILVER SULFADIAZINE 1 % EX CREA
TOPICAL_CREAM | Freq: Once | CUTANEOUS | Status: AC
  Administered 2014-10-12: 22:00:00 via TOPICAL
  Filled 2014-10-12: qty 85

## 2014-10-12 MED ORDER — OXYCODONE-ACETAMINOPHEN 7.5-325 MG PO TABS: 1.0000 | ORAL_TABLET | Freq: Four times a day (QID) | ORAL | Status: DC | PRN

## 2014-10-12 NOTE — Telephone Encounter (Signed)
Can we get her in for an appt w/ Tim or Alycia Rossetti? She's overdue for a f/u. Thank you!

## 2014-10-12 NOTE — ED Notes (Signed)
AAOx3.  Skin warm and dry.  NAD.  D/C home 

## 2014-10-12 NOTE — ED Notes (Signed)
Pt presents to ER alert and in NAD. Pt states she was sunburned on Saturday and has started to peel. Pt also reports she has hand, foot and mouth disease. Pt has raw, red areas noted to thighs.

## 2014-10-12 NOTE — Discharge Instructions (Signed)
Sunburn   Sunburn is skin damage from being out in the sun too long. If you have light or fair skin, you may get sunburned more easily. Getting sunburned over and over can cause wrinkles and dark spots on the skin (sun spots). It can also increase your chance of getting skin cancer.  HOME CARE  · Avoid being out in the sun until your sunburn is gone.  · Take a cool bath to help lessen pain. Put a cold, damp washcloth on the sunburn to help lessen pain. Do not put ice on the sunburn.  · Only take medicine as told by your doctor.  · Use sunburn creams or gels on your skin but not on blisters.  · Drink enough fluids to keep your pee (urine) clear or pale yellow.  · Do not break blisters. If blisters break, your doctor may tell you to use a medicated cream on the area.  To keep from getting sunburned:  · Avoid the sun between 10:00 a.m. and 4:00 p.m. during the day.  · Put sunscreen on 30 minutes before being in the sun.  · Wear a hat, clothing, and sunglasses to protect against the sun.  · Avoid medicines, herbs, and foods that make you more sensitive to sun.  · Avoid tanning beds.  GET HELP RIGHT AWAY IF:  · You have a fever.  · You have pain and medicine does not help.  · You throw up (vomit) or have watery poop (diarrhea).  · You feel like you will pass out (faint).  · You have a headache and feel confused.  · You have very bad blisters.  · You have yellowish-white fluid (pus) coming from your blisters.  · Your burn gets more painful and puffy (swollen).  MAKE SURE YOU:  · Understand these instructions.  · Will watch your condition.  · Will get help right away if you are not doing well or get worse.  Document Released: 11/13/2010 Document Revised: 06/28/2012 Document Reviewed: 11/13/2010  [employer]® Patient Information ©2015 [employer]. This information is not intended to replace advice given to you by your health care provider. Make sure you discuss any questions you have with your health care provider.

## 2014-10-12 NOTE — Telephone Encounter (Signed)
[facility] due to having hand, foot, and mouth disease, the Dr. There advised her to call her cardiologist, states "something was going up to her heart", she was not sure without doing a EKG. Please call.

## 2014-10-12 NOTE — Telephone Encounter (Signed)
Pt sched to see Dr. Mariah Milling tomorrow at 8:40.

## 2014-10-12 NOTE — ED Provider Notes (Signed)
[facility] Emergency Department Provider Note  ____________________________________________  Time seen: Approximately 10:15 PM  I have reviewed the triage vital signs and the nursing notes.   HISTORY  Chief Complaint Sunburn    HPI Cheyenne Gray is a 49 y.o. female complaining of second-degree sunburns to the lower extremity. Patient states sunburn started on 4 days ago . Patient state blisters started rupturing yesterday. Now patient resemble peeling skin and pain. Patient has applying cool compresses to area and taken ibuprofen.   Past Medical History  Diagnosis Date  . Migraines   . Chronic pain   . DJD (degenerative joint disease)   . Iron deficiency anemia   . Long QT interval   . Syncope and collapse   . Iron deficiency   . Heart murmur     as child  . Arthritis   . Asthma   . Obesity   . Migraine   . Psoriasis   . Chronic back pain   . DJD (degenerative joint disease), multiple sites   . B12 deficiency   . Vitamin D deficiency   . Depression   . History of shingles     Patient Active Problem List   Diagnosis Date Noted  . IDA (iron deficiency anemia) 09/19/2014  . 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 DDD (degenerative disc disease), lumbar 09/14/2014  . Sacroiliac joint dysfunction 09/14/2014  . Thoracic facet syndrome 09/14/2014  . Facet syndrome, lumbar 09/14/2014  . Hyperlipidemia 01/25/2014  . PVC (premature ventricular contraction) 01/10/2014  . Fatigue 05/25/2013  . CAD (coronary artery disease) 04/22/2013  . PVD (peripheral vascular disease) 04/22/2013  . Long Q-T syndrome 07/09/2012  . Chest pressure 07/09/2012  . NSVT (nonsustained ventricular tachycardia) 07/09/2012  . Syncope 07/09/2012  . MUSCLE STRAIN 07/20/2009    Past Surgical History  Procedure Laterality Date  . Bariatric surgery  2001  . Cholecystectomy  2001  . Gastroplasty    . Gastric bypass    . Gallbladder surgery       Current Outpatient Rx  Name  Route  Sig  Dispense  Refill  . albuterol (PROVENTIL HFA;VENTOLIN HFA) 108 (90 BASE) MCG/ACT inhaler   Inhalation   Inhale 2 puffs into the lungs as needed for wheezing.         . cyclobenzaprine (FLEXERIL) 10 MG tablet   Oral   Take 10 mg by mouth at bedtime as needed for muscle spasms.         Marland Kitchen gabapentin (NEURONTIN) 400 MG capsule      Takes 2 tablet am and 2 tablets noon and 3 pm daily.         . methadone (DOLOPHINE) 10 MG tablet   Oral   Take 20 mg by mouth 3 (three) times daily as needed for pain.         Marland Kitchen oxyCODONE-acetaminophen (PERCOCET) 7.5-325 MG per tablet   Oral   Take 1 tablet by mouth every 6 (six) hours as needed for severe pain.   12 tablet   0   . petrolatum-hydrophilic-aloe vera (ALOE VESTA) ointment   Topical   Apply topically 2 (two) times daily as needed for wound care.   226 g   0   . SUMAtriptan (IMITREX) 25 MG tablet   Oral   Take 25 mg by mouth every 2 (two) hours as needed for migraine. May repeat in 2 hours if headache persists or recurs.  Allergies Penicillins  Family History  Problem Relation Age of Onset  . Heart attack Mother   . Cancer Mother   . Heart attack Father 45    MI  . Heart attack Brother   . Heart disease Brother   . Heart attack Maternal Grandmother   . Cancer Maternal Grandmother   . Cancer Maternal Uncle     Social History History  Substance Use Topics  . Smoking status: Never Smoker   . Smokeless tobacco: Not on file  . Alcohol Use: Yes    Review of Systems Constitutional: No fever/chills Eyes: No visual changes. ENT: No sore throat. Cardiovascular: Denies chest pain. Respiratory: Denies shortness of breath. Gastrointestinal: No abdominal pain.  No nausea, no vomiting.  No diarrhea.  No constipation. Genitourinary: Negative for dysuria. Musculoskeletal: Negative for back pain. Skin: Negative for rash. Sunburn bilateral legs. Neurological:  Negative for headaches, focal weakness or numbness. Psychiatric:Depression Endocrine: Hematological/Lymphatic: Allergic/Immunilogical:  10-point ROS otherwise negative.  ____________________________________________   PHYSICAL EXAM:  VITAL SIGNS: ED Triage Vitals  Enc Vitals Group     BP 10/12/14 2140 145/85 mmHg     Pulse Rate 10/12/14 2140 62     Resp 10/12/14 2140 20     Temp 10/12/14 2140 98.1 F (36.7 C)     Temp Source 10/12/14 2140 Oral     SpO2 10/12/14 2140 98 %     Weight 10/12/14 2140 180 lb (81.647 kg)     Height 10/12/14 2140  (1.651 m)     Head Cir --      Peak Flow --      Pain Score 10/12/14 2141 0     Pain Loc --      Pain Edu? --      Excl. in GC? --     Constitutional: Alert and oriented. Well appearing and in no acute distress. Eyes: Conjunctivae are normal. PERRL. EOMI. Head: Atraumatic. Nose: No congestion/rhinnorhea. Mouth/Throat: Mucous membranes are moist.  Oropharynx non-erythematous. Neck: No stridor. *No cervical spine tenderness to palpation. Hematological/Lymphatic/Immunilogical: No cervical lymphadenopathy. Cardiovascular: Normal rate, regular rhythm. Grossly normal heart sounds.  Good peripheral circulation. Respiratory: Normal respiratory effort.  No retractions. Lungs CTAB. Gastrointestinal: Soft and nontender. No distention. No abdominal bruits. No CVA tenderness. Musculoskeletal: No lower extremity tenderness nor edema.  No joint effusions. Neurologic:  Normal speech and language. No gross focal neurologic deficits are appreciated. No gait instability. Skin:  Skin is warm, dry and intact. No rash noted. Erythematous large macular lesions with peeling skin bilateral legs. Psychiatric: Mood and affect are normal. Speech and behavior are normal.  ____________________________________________   LABS (all labs ordered are listed, but only abnormal results are displayed)  Labs Reviewed - No data to  display ____________________________________________  EKG   ____________________________________________  RADIOLOGY   ____________________________________________   PROCEDURES  Procedure(s) performed: None  Critical Care performed: No  ____________________________________________   INITIAL IMPRESSION / ASSESSMENT AND PLAN / ED COURSE  Pertinent labs & imaging results that were available during my care of the patient were reviewed by me and considered in my medical decision making (see chart for details).  Sunburn bilateral legs. Patient given instructional home care. Patient given prescription for Aloe Vesta ointment. And Percocets. Patient given a work excuse. Patient advised follow-up family doctor in 2-3 days if no improvement. ____________________________________________   FINAL CLINICAL IMPRESSION(S) / ED DIAGNOSES  Final diagnoses:  Sunburn of second degree      Joni Reining, PA-C 10/12/14  2228  Phineas Semen, MD 10/12/14 2250

## 2014-10-13 ENCOUNTER — Ambulatory Visit: Payer: Medicare Other | Admitting: Cardiovascular Disease

## 2014-10-31 ENCOUNTER — Encounter: Payer: Self-pay | Admitting: Pain Medicine

## 2014-10-31 ENCOUNTER — Ambulatory Visit: Payer: Medicare Other | Attending: Pain Medicine | Admitting: Pain Medicine

## 2014-10-31 VITALS — BP 126/84 | HR 70 | Temp 98.1°F | Resp 18 | Ht 64.0 in | Wt 180.0 lb

## 2014-10-31 DIAGNOSIS — M47816 Spondylosis without myelopathy or radiculopathy, lumbar region: Secondary | ICD-10-CM

## 2014-10-31 DIAGNOSIS — M79605 Pain in left leg: Secondary | ICD-10-CM | POA: Diagnosis present

## 2014-10-31 DIAGNOSIS — M533 Sacrococcygeal disorders, not elsewhere classified: Secondary | ICD-10-CM | POA: Insufficient documentation

## 2014-10-31 DIAGNOSIS — M5134 Other intervertebral disc degeneration, thoracic region: Secondary | ICD-10-CM | POA: Diagnosis not present

## 2014-10-31 DIAGNOSIS — M545 Low back pain: Secondary | ICD-10-CM | POA: Diagnosis present

## 2014-10-31 DIAGNOSIS — M47894 Other spondylosis, thoracic region: Secondary | ICD-10-CM

## 2014-10-31 DIAGNOSIS — M5136 Other intervertebral disc degeneration, lumbar region: Secondary | ICD-10-CM | POA: Insufficient documentation

## 2014-10-31 DIAGNOSIS — M47814 Spondylosis without myelopathy or radiculopathy, thoracic region: Secondary | ICD-10-CM

## 2014-10-31 DIAGNOSIS — G588 Other specified mononeuropathies: Secondary | ICD-10-CM | POA: Insufficient documentation

## 2014-10-31 DIAGNOSIS — M79604 Pain in right leg: Secondary | ICD-10-CM | POA: Diagnosis present

## 2014-10-31 MED ORDER — GABAPENTIN 400 MG PO CAPS: ORAL_CAPSULE | ORAL | Status: DC

## 2014-10-31 MED ORDER — METHADONE HCL 10 MG PO TABS: ORAL_TABLET | ORAL | Status: DC

## 2014-10-31 NOTE — Patient Instructions (Addendum)
Continue present medications  Lumbar facet, medial branch nerve, blocks to be performed at time of return appointment  F/U PCP Dr.Aycock  for evaliation of  BP and general medical  condition  F/U surgical evaluation  F/U neurological evaluation  May consider radiofrequency rhizolysis or intraspinal procedures pending response to present treatment and F/U evaluation   Patient to call [facility] Management Center should patient have concerns prior to scheduled return appointmen. GENERAL RISKS AND COMPLICATIONS  What are the risk, side effects and possible complications? Generally speaking, most procedures are safe.  However, with any procedure there are risks, side effects, and the possibility of complications.  The risks and complications are dependent upon the sites that are lesioned, or the type of nerve block to be performed.  The closer the procedure is to the spine, the more serious the risks are.  Great care is taken when placing the radio frequency needles, block needles or lesioning probes, but sometimes complications can occur. 1. Infection: Any time there is an injection through the skin, there is a risk of infection.  This is why sterile conditions are used for these blocks.  There are four possible types of infection. 1. Localized skin infection. 2. Central Nervous System Infection-This can be in the form of Meningitis, which can be deadly. 3. Epidural Infections-This can be in the form of an epidural abscess, which can cause pressure inside of the spine, causing compression of the spinal cord with subsequent paralysis. This would require an emergency surgery to decompress, and there are no guarantees that the patient would recover from the paralysis. 4. Discitis-This is an infection of the intervertebral discs.  It occurs in about 1% of discography procedures.  It is difficult to treat and it may lead to surgery.        2. Pain: the needles have to go through skin and soft tissues, will  cause soreness.       3. Damage to internal structures:  The nerves to be lesioned may be near blood vessels or    other nerves which can be potentially damaged.       4. Bleeding: Bleeding is more common if the patient is taking blood thinners such as  aspirin, Coumadin, Ticiid, Plavix, etc., or if he/she have some genetic predisposition  such as hemophilia. Bleeding into the spinal canal can cause compression of the spinal  cord with subsequent paralysis.  This would require an emergency surgery to  decompress and there are no guarantees that the patient would recover from the  paralysis.       5. Pneumothorax:  Puncturing of a lung is a possibility, every time a needle is introduced in  the area of the chest or upper back.  Pneumothorax refers to free air around the  collapsed lung(s), inside of the thoracic cavity (chest cavity).  Another two possible  complications related to a similar event would include: Hemothorax and Chylothorax.   These are variations of the Pneumothorax, where instead of air around the collapsed  lung(s), you may have blood or chyle, respectively.       6. Spinal headaches: They may occur with any procedures in the area of the spine.       7. Persistent CSF (Cerebro-Spinal Fluid) leakage: This is a rare problem, but may occur  with prolonged intrathecal or epidural catheters either due to the formation of a fistulous  track or a dural tear.       8. Nerve damage: By working  so close to the spinal cord, there is always a possibility of  nerve damage, which could be as serious as a permanent spinal cord injury with  paralysis.       9. Death:  Although rare, severe deadly allergic reactions known as "Anaphylactic  reaction" can occur to any of the medications used.      10. Worsening of the symptoms:  We can always make thing worse.  What are the chances of something like this happening? Chances of any of this occuring are extremely low.  By statistics, you have more of a chance  of getting killed in a motor vehicle accident: while driving to the hospital than any of the above occurring .  Nevertheless, you should be aware that they are possibilities.  In general, it is similar to taking a shower.  Everybody knows that you can slip, hit your head and get killed.  Does that mean that you should not shower again?  Nevertheless always keep in mind that statistics do not mean anything if you happen to be on the wrong side of them.  Even if a procedure has a 1 (one) in a 1,000,000 (million) chance of going wrong, it you happen to be that one..Also, keep in mind that by statistics, you have more of a chance of having something go wrong when taking medications.  Who should not have this procedure? If you are on a blood thinning medication (e.g. Coumadin, Plavix, see list of "Blood Thinners"), or if you have an active infection going on, you should not have the procedure.  If you are taking any blood thinners, please inform your physician.  How should I prepare for this procedure?  Do not eat or drink anything at least six hours prior to the procedure.  Bring a driver with you .  It cannot be a taxi.  Come accompanied by an adult that can drive you back, and that is strong enough to help you if your legs get weak or numb from the local anesthetic.  Take all of your medicines the morning of the procedure with just enough water to swallow them.  If you have diabetes, make sure that you are scheduled to have your procedure done first thing in the morning, whenever possible.  If you have diabetes, take only half of your insulin dose and notify our nurse that you have done so as soon as you arrive at the clinic.  If you are diabetic, but only take blood sugar pills (oral hypoglycemic), then do not take them on the morning of your procedure.  You may take them after you have had the procedure.  Do not take aspirin or any aspirin-containing medications, at least eleven (11) days prior  to the procedure.  They may prolong bleeding.  Wear loose fitting clothing that may be easy to take off and that you would not mind if it got stained with Betadine or blood.  Do not wear any jewelry or perfume  Remove any nail coloring.  It will interfere with some of our monitoring equipment.  NOTE: Remember that this is not meant to be interpreted as a complete list of all possible complications.  Unforeseen problems may occur.  BLOOD THINNERS The following drugs contain aspirin or other products, which can cause increased bleeding during surgery and should not be taken for 2 weeks prior to and 1 week after surgery.  If you should need take something for relief of minor pain, you may take acetaminophen  which is found in Tylenol,m Datril, Anacin-3 and Panadol. It is not blood thinner. The products listed below are.  Do not take any of the products listed below in addition to any listed on your instruction sheet.  A.P.C or A.P.C with Codeine Codeine Phosphate Capsules #3 Ibuprofen Ridaura  ABC compound Congesprin Imuran rimadil  Advil Cope Indocin Robaxisal  Alka-Seltzer Effervescent Pain Reliever and Antacid Coricidin or Coricidin-D  Indomethacin Rufen  Alka-Seltzer plus Cold Medicine Cosprin Ketoprofen S-A-C Tablets  Anacin Analgesic Tablets or Capsules Coumadin Korlgesic Salflex  Anacin Extra Strength Analgesic tablets or capsules CP-2 Tablets Lanoril Salicylate  Anaprox Cuprimine Capsules Levenox Salocol  Anexsia-D Dalteparin Magan Salsalate  Anodynos Darvon compound Magnesium Salicylate Sine-off  Ansaid Dasin Capsules Magsal Sodium Salicylate  Anturane Depen Capsules Marnal Soma  APF Arthritis pain formula Dewitt's Pills Measurin Stanback  Argesic Dia-Gesic Meclofenamic Sulfinpyrazone  Arthritis Bayer Timed Release Aspirin Diclofenac Meclomen Sulindac  Arthritis pain formula Anacin Dicumarol Medipren Supac  Analgesic (Safety coated) Arthralgen Diffunasal Mefanamic Suprofen   Arthritis Strength Bufferin Dihydrocodeine Mepro Compound Suprol  Arthropan liquid Dopirydamole Methcarbomol with Aspirin Synalgos  ASA tablets/Enseals Disalcid Micrainin Tagament  Ascriptin Doan's Midol Talwin  Ascriptin A/D Dolene Mobidin Tanderil  Ascriptin Extra Strength Dolobid Moblgesic Ticlid  Ascriptin with Codeine Doloprin or Doloprin with Codeine Momentum Tolectin  Asperbuf Duoprin Mono-gesic Trendar  Aspergum Duradyne Motrin or Motrin IB Triminicin  Aspirin plain, buffered or enteric coated Durasal Myochrisine Trigesic  Aspirin Suppositories Easprin Nalfon Trillsate  Aspirin with Codeine Ecotrin Regular or Extra Strength Naprosyn Uracel  Atromid-S Efficin Naproxen Ursinus  Auranofin Capsules Elmiron Neocylate Vanquish  Axotal Emagrin Norgesic Verin  Azathioprine Empirin or Empirin with Codeine Normiflo Vitamin E  Azolid Emprazil Nuprin Voltaren  Bayer Aspirin plain, buffered or children's or timed BC Tablets or powders Encaprin Orgaran Warfarin Sodium  Buff-a-Comp Enoxaparin Orudis Zorpin  Buff-a-Comp with Codeine Equegesic Os-Cal-Gesic   Buffaprin Excedrin plain, buffered or Extra Strength Oxalid   Bufferin Arthritis Strength Feldene Oxphenbutazone   Bufferin plain or Extra Strength Feldene Capsules Oxycodone with Aspirin   Bufferin with Codeine Fenoprofen Fenoprofen Pabalate or Pabalate-SF   Buffets II Flogesic Panagesic   Buffinol plain or Extra Strength Florinal or Florinal with Codeine Panwarfarin   Buf-Tabs Flurbiprofen Penicillamine   Butalbital Compound Four-way cold tablets Penicillin   Butazolidin Fragmin Pepto-Bismol   Carbenicillin Geminisyn Percodan   Carna Arthritis Reliever Geopen Persantine   Carprofen Gold's salt Persistin   Chloramphenicol Goody's Phenylbutazone   Chloromycetin Haltrain Piroxlcam   Clmetidine heparin Plaquenil   Cllnoril Hyco-pap Ponstel   Clofibrate Hydroxy chloroquine Propoxyphen         Before stopping any of these medications,  be sure to consult the physician who ordered them.  Some, such as Coumadin (Warfarin) are ordered to prevent or treat serious conditions such as "deep thrombosis", "pumonary embolisms", and other heart problems.  The amount of time that you may need off of the medication may also vary with the medication and the reason for which you were taking it.  If you are taking any of these medications, please make sure you notify your pain physician before you undergo any procedures.         Facet Joint Block, Care After Refer to this sheet in the next few weeks. These instructions provide you with information on caring for yourself after your procedure. Your health care provider may also give you more specific instructions. Your treatment has been planned according to current medical practices, but  problems sometimes occur. Call your health care provider if you have any problems or questions after your procedure. HOME CARE INSTRUCTIONS  2. Keep track of the amount of pain relief you feel and how long it lasts. 3. Limit pain medicine within the first 4-6 hours after the procedure as directed by your health care provider. 4. Resume taking dietary supplements and medicines as directed by your health care provider. 5. You may resume your regular diet. 6. Do not apply heat near or over the injection site(s) for 24 hours.  7. Do not take a bath or soak in water (such as a pool or lake) for 24 hours. 8. Do not drive for 24 hours unless approved by your health care provider. 9. Avoid strenuous activity for 24 hours. 10. Remove your bandages the morning after the procedure.  11. If the injection site is tender, applying an ice pack may relieve some tenderness. To do this: 1. Put ice in a bag. 2. Place a towel between your skin and the bag. 3. Leave the ice on for 15-20 minutes, 3-4 times a day. 12. Keep follow-up appointments as directed by your health care provider. SEEK MEDICAL CARE IF:   Your pain is  not controlled by your medicines.   There is drainage from the injection site.   There is significant bleeding or swelling at the injection site.  You have diabetes and your blood sugar is above 180 mg/dL. SEEK IMMEDIATE MEDICAL CARE IF:   You develop a fever of 101F (38.3C) or greater.   You have worsening pain or swelling around the injection site.   You have red streaking around the injection site.   You develop severe pain that is not controlled by your medicines.   You develop a headache, stiff neck, nausea, or vomiting.   Your eyes become very sensitive to light.   You have weakness, paralysis, or tingling in your arms or legs that was not present before the procedure.   You develop difficulty urinating or breathing.  Document Released: 02/18/2012 Document Revised: 07/18/2013 Document Reviewed: 02/18/2012 [facility] Patient Information 2015 [place], [place]. This information is not intended to replace advice given to you by your health care provider. Make sure you discuss any questions you have with your health care provider. Facet Joint Block The facet joints connect the bones of the spine (vertebrae). They make it possible for you to bend, twist, and make other movements with your spine. They also prevent you from overbending, overtwisting, and making other excessive movements.  A facet joint block is a procedure where a numbing medicine (anesthetic) is injected into a facet joint. Often, a type of anti-inflammatory medicine called a steroid is also injected. A facet joint block may be done for two reasons:  13. Diagnosis. A facet joint block may be done as a test to see whether neck or back pain is caused by a worn-down or infected facet joint. If the pain gets better after a facet joint block, it means the pain is probably coming from the facet joint. If the pain does not get better, it means the pain is probably not coming from the facet joint.  14. Therapy. A facet  joint block may be done to relieve neck or back pain caused by a facet joint. A facet joint block is only done as a therapy if the pain does not improve with medicine, exercise programs, physical therapy, and other forms of pain management. LET [facility] CARE PROVIDER KNOW ABOUT:  Any allergies you have.   All medicines you are taking, including vitamins, herbs, eyedrops, and over-the-counter medicines and creams.   Previous problems you or members of your family have had with the use of anesthetics.   Any blood disorders you have had.   Other health problems you have. RISKS AND COMPLICATIONS Generally, having a facet joint block is safe. However, as with any procedure, complications can occur. Possible complications associated with having a facet joint block include:   Bleeding.   Injury to a nerve near the injection site.   Pain at the injection site.   Weakness or numbness in areas controlled by nerves near the injection site.   Infection.   Temporary fluid retention.   Allergic reaction to anesthetics or medicines used during the procedure. BEFORE THE PROCEDURE   Follow your health care provider's instructions if you are taking dietary supplements or medicines. You may need to stop taking them or reduce your dosage.   Do not take any new dietary supplements or medicines without asking your health care provider first.   Follow your health care provider's instructions about eating and drinking before the procedure. You may need to stop eating and drinking several hours before the procedure.   Arrange to have an adult drive you home after the procedure. PROCEDURE 12. You may need to remove your clothing and dress in an open-back gown so that your health care provider can access your spine.  13. The procedure will be done while you are lying on an X-ray table. Most of the time you will be asked to lie on your stomach, but you may be asked to lie in a different  position if an injection will be made in your neck.  14. Special machines will be used to monitor your oxygen levels, heart rate, and blood pressure.  15. If an injection will be made in your neck, an intravenous (IV) tube will be inserted into one of your veins. Fluids and medicine will flow directly into your body through the IV tube.  16. The area over the facet joint where the injection will be made will be cleaned with an antiseptic soap. The surrounding skin will be covered with sterile drapes.  17. An anesthetic will be applied to your skin to make the injection area numb. You may feel a temporary stinging or burning sensation.  18. A video X-ray machine will be used to locate the joint. A contrast dye may be injected into the facet joint area to help with locating the joint.  19. When the joint is located, an anesthetic medicine will be injected into the joint through the needle.  20. Your health care provider will ask you whether you feel pain relief. If you do feel relief, a steroid may be injected to provide pain relief for a longer period of time. If you do not feel relief or feel only partial relief, additional injections of an anesthetic may be made in other facet joints.  21. The needle will be removed, the skin will be cleansed, and bandages will be applied.  AFTER THE PROCEDURE   You will be observed for 15-30 minutes before being allowed to go home. Do not drive. Have an adult drive you or take a taxi or public transportation instead.   If you feel pain relief, the pain will return in several hours or days when the anesthetic wears off.   You may feel pain relief 2-14 days after the procedure. The amount of  time this relief lasts varies from person to person.   It is normal to feel some tenderness over the injected area(s) for 2 days following the procedure.   If you have diabetes, you may have a temporary increase in blood sugar. Document Released: 07/23/2006  Document Revised: 07/18/2013 Document Reviewed: 12/22/2011 [facility] Patient Information 2015 [place], [place]. This information is not intended to replace advice given to you by your health care provider. Make sure you discuss any questions you have with your health care provider.

## 2014-10-31 NOTE — Progress Notes (Signed)
Verified with pharmacy the dose and quantity of gabapentin and methadone.

## 2014-10-31 NOTE — Progress Notes (Signed)
Safety precautions to be maintained throughout the outpatient stay will include: orient to surroundings, keep bed in low position, maintain call bell within reach at all times, provide assistance with transfer out of bed and ambulation.  

## 2014-10-31 NOTE — Progress Notes (Signed)
   Subjective:    Patient ID: Cheyenne Gray, female    DOB: 1965/12/23, 49 y.o.   MRN: [id number]  HPI  Patient is 49 year old female returns to pain medicine Center for further evaluation and treatment of pain involving the lower back and lower extremity region predominantly. Patient states she had significant relief of pain following the lumbar facet, medial branch nerve, blocks. At the present time we will schedule patient for lumbar facet, medial branch nerve, blocks at time return appointment in attempt to decrease severity of patient's symptoms, minimize progression of symptoms, and a dural avoid the need for more involved treatment. The patient was understanding and in agreement status treatment plan. Patient without any trauma change in events of daily living the call significant change in symptomatology stated that the lumbar facet, medial branch nerve, blocks of her to have significant relief of pain. We will proceed with lumbar sympathetic, medial branch nerve, blocks at time return appointment and remain available to consider modification of treatment as discussed and explained to patient on today's visit. The patient was understanding and in agreement with suggested treatment plan    Review of Systems     Objective:   Physical Exam   There was tenderness of the splenius capitis and occipitalis musculature region of mild to moderate degree. There appeared to be unremarkable Spurling's maneuver with tends to palpation of the trapezius and levator scapula and rhomboid musculature region reproducing pain of mild to moderate degree with no crepitus of the thoracic region noted. Patient appeared to be with unremarkable Spurling's maneuver and bilateral equal grip strength Tinel and Phalen's maneuver without increase of pain of significant degree. He was palpation over the lumbar facet lumbar paraspinal musculature region with lateral bending and rotation and extension and palpation over the  lumbar facets reproducing moderate discomfort. There was moderate tenderness of the PSIS PSIS regions as well as the gluteal and piriformis musculature region. There was mild tinnitus of the greater trochanteric region iliotibial band region. Abdomen was without excessive tends to palpation and no costovertebral tenderness was noted. 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 Patient was given prescriptions for methadone and Neurontin on today's visit and was cautioned regarding respiratory depression confusion and other side effects which can occur with these medications. Patient was presently taking these medications and needed refill on today's visit with medications previously prescribed by [facility]  Lumbar facet, medial branch nerve, blocks to be performed at time of return appointment    F/U PCP Dr.Aycock   for evaliation of  BP and general medical  condition  F/U surgical evaluation  F/U neurological evaluation  May consider radiofrequency rhizolysis or intraspinal procedures pending response to present treatment and F/U evaluation   Patient to call Pain Management Center should patient have concerns prior to scheduled return appointmen.

## 2014-11-06 ENCOUNTER — Encounter: Payer: Self-pay | Admitting: Physician Assistant

## 2014-11-06 ENCOUNTER — Ambulatory Visit: Payer: Medicare Other | Attending: Pain Medicine | Admitting: Pain Medicine

## 2014-11-06 ENCOUNTER — Ambulatory Visit: Payer: Medicare Other | Admitting: Physician Assistant

## 2014-11-06 VITALS — BP 116/83 | HR 51 | Temp 97.6°F | Resp 18 | Ht 65.0 in | Wt 180.0 lb

## 2014-11-06 DIAGNOSIS — M47816 Spondylosis without myelopathy or radiculopathy, lumbar region: Secondary | ICD-10-CM

## 2014-11-06 DIAGNOSIS — M545 Low back pain: Secondary | ICD-10-CM | POA: Diagnosis present

## 2014-11-06 DIAGNOSIS — M47814 Spondylosis without myelopathy or radiculopathy, thoracic region: Secondary | ICD-10-CM

## 2014-11-06 DIAGNOSIS — M79604 Pain in right leg: Secondary | ICD-10-CM | POA: Diagnosis present

## 2014-11-06 DIAGNOSIS — M47894 Other spondylosis, thoracic region: Secondary | ICD-10-CM

## 2014-11-06 DIAGNOSIS — M2578 Osteophyte, vertebrae: Secondary | ICD-10-CM | POA: Insufficient documentation

## 2014-11-06 DIAGNOSIS — M79605 Pain in left leg: Secondary | ICD-10-CM | POA: Diagnosis present

## 2014-11-06 DIAGNOSIS — M5134 Other intervertebral disc degeneration, thoracic region: Secondary | ICD-10-CM

## 2014-11-06 DIAGNOSIS — M5136 Other intervertebral disc degeneration, lumbar region: Secondary | ICD-10-CM | POA: Insufficient documentation

## 2014-11-06 DIAGNOSIS — M5126 Other intervertebral disc displacement, lumbar region: Secondary | ICD-10-CM | POA: Insufficient documentation

## 2014-11-06 DIAGNOSIS — M51369 Other intervertebral disc degeneration, lumbar region without mention of lumbar back pain or lower extremity pain: Secondary | ICD-10-CM

## 2014-11-06 DIAGNOSIS — M533 Sacrococcygeal disorders, not elsewhere classified: Secondary | ICD-10-CM

## 2014-11-06 DIAGNOSIS — M4806 Spinal stenosis, lumbar region: Secondary | ICD-10-CM | POA: Insufficient documentation

## 2014-11-06 MED ORDER — BUPIVACAINE HCL (PF) 0.25 % IJ SOLN
INTRAMUSCULAR | Status: AC
  Administered 2014-11-06: 14:00:00
  Filled 2014-11-06: qty 30

## 2014-11-06 MED ORDER — TRIAMCINOLONE ACETONIDE 40 MG/ML IJ SUSP
INTRAMUSCULAR | Status: AC
  Administered 2014-11-06: 14:00:00
  Filled 2014-11-06: qty 1

## 2014-11-06 MED ORDER — ORPHENADRINE CITRATE 30 MG/ML IJ SOLN
INTRAMUSCULAR | Status: AC
  Administered 2014-11-06: 14:00:00
  Filled 2014-11-06: qty 2

## 2014-11-06 MED ORDER — FENTANYL CITRATE (PF) 100 MCG/2ML IJ SOLN
INTRAMUSCULAR | Status: AC
  Administered 2014-11-06: 100 ug via INTRAVENOUS
  Filled 2014-11-06: qty 2

## 2014-11-06 MED ORDER — MIDAZOLAM HCL 5 MG/5ML IJ SOLN
INTRAMUSCULAR | Status: AC
  Administered 2014-11-06: 5 mg via INTRAVENOUS
  Filled 2014-11-06: qty 5

## 2014-11-06 NOTE — Progress Notes (Signed)
Subjective:    Patient ID: Cheyenne Gray, female    DOB: [birth year], 49 y.o.   MRN: [id number]  HPI    PROCEDURE PERFORMED: Lumbar facet (medial branch block)   NOTE: The patient is a 49 y.o. female who [facility] for further evaluation and treatment of pain involving the lumbar and lower extremity region.  MRI  revealed the patient to be with evidence of degenerative disc disease lumbar spine.  L3 for moderate to marked spinal stenosis. L4-5 bulge with superimposed central disc protrusion slightly greater to the left. Indentation of the ventral aspect of the thecal sac slightly greater to the left. Facet degenerative changes. L5-S1 bulge with osteophyte with foraminal/lateral extension slightly greater on the left. Mild encroachment upon but not compressing the exiting L5 nerve roots. 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 The risks, benefits, and expectations of the procedure have been discussed and explained to the patient who was understanding and in agreement with suggested treatment plan. We will proceed with interventional treatment as discussed and as explained to the patient who was understanding and wished to proceed with procedure as planned.   DESCRIPTION OF PROCEDURE: Lumbar facet (medial branch block) with IV Versed, IV fentanyl conscious sedation, EKG, blood pressure, pulse, and pulse oximetry monitoring. The procedure was performed with the patient in the prone position. Betadine prep of proposed entry site performed.   NEEDLE PLACEMENT AT:  left L  3 lumbar facet (medial branch block). Under fluoroscopic guidance with oblique orientation of 15 degrees, a 22-gauge needle was inserted at the L  3 vertebral body level with needle placed at the targeted area of Burton's Eye or Eye of the Scotty Dog with documentation of  needle placement in the superior and lateral border of targeted area of Burton's Eye or Eye of the Scotty Dog with oblique orientation of 15 degrees. Following documentation of needle placement at the L  3 vertebral body level, needle placement was then accomplished at the L  4 vertebral body level.   NEEDLE PLACEMENT AT  L4 and L5 VERTEBRAL BODY LEVELS ON THE LEFT SIDE The procedure was performed at the  L4 and L5 vertebral body levels exactly as was performed at the L  3 vertebral body level utilizing the same technique and under fluoroscopic guidance.  NEEDLE PLACEMENT AT THE SACRAL ALA with AP view of the lumbosacral spine. With the patient in the prone position, Betadine prep of proposed entry site accomplished, a 22 gauge needle was inserted in the region of the sacral ala (groove formed by the superior articulating process of S1 and the sacral wing). Following documentation of needle placement at the sacral ala,  needle placement was then accomplished at the S1 foramen level.   NEEDLE PLACEMENT AT THE S1 FORAMEN LEVEL under fluoroscopic guidance with AP view of the lumbosacral spine and cephalad orientation of the fluoroscope, a 22-gauge needle was placed at the superior and lateral border of the S1 foramen under fluoroscopic guidance. Following documentation of needle placement at the S1 foramen.   Needle placement was then verified at all levels on lateral view. Following documentation of needle placement at all levels on lateral view and following negative aspiration for heme and CSF, each level was injected with 1 mL of 0.25% bupivacaine with Kenalog.     LUMBAR FACET, MEDIAL BRANCH NERVE, BLOCKS PERFORMED ON  THE RIGHT SIDE   The procedure was performed on the right side exactly as was performed on the left side at the same levels and utilizing the same technique under fluoroscopic guidance.     The patient tolerated the procedure well. A total of 40 mg of Kenalog was utilized for  the procedure.   PLAN:  1. Medications: The patient will continue presently prescribed medications. Neurontin and methadone 2. May consider modification of treatment regimen at time of return appointment pending response to treatment rendered on today's visit. 3. The patient is to follow-up with primary care physician Dr. Letta Pate  for further evaluation of blood pressure and general medical condition status post steroid injection performed on today's visit. 4. Surgical follow-up evaluation. 5. Neurological follow-up evaluation. 6. The patient may be candidate for radiofrequency procedures, implantation type procedures, and other treatment pending response to treatment and follow-up evaluation. 7. The patient has been advised to call the [facility] prior to scheduled return appointment should there be significant change in condition or should patient have other concerns regarding condition prior to scheduled return appointment.  The patient is understanding and in agreement with suggested treatment plan.   Review of Systems     Objective:   Physical Exam        Assessment & Plan:

## 2014-11-06 NOTE — Patient Instructions (Addendum)
Continue present medications Neurontin and methadone   F/U PCP Dr.Aycock  for evaliation of  BP and general medical  condition  F/U surgical evaluation  F/U neurological evaluation  May consider radiofrequency rhizolysis or intraspinal procedures pending response to present treatment and F/U evaluation   Patient to call [facility] should patient have concerns prior to scheduled return appointmen. Pain Management Discharge Instructions  General Discharge Instructions :  If you need to reach your doctor call: Monday-Friday 8:00 am - 4:00 pm at [phone] or toll free [phone].  After clinic hours [phone] to have operator reach doctor.  Bring all of your medication bottles to all your appointments in the pain clinic.  To cancel or reschedule your appointment with Pain Management please remember to call 24 hours in advance to avoid a fee.  Refer to the educational materials which you have been given on: General Risks, I had my Procedure. Discharge Instructions, Post Sedation.  Post Procedure Instructions:  The drugs you were given will stay in your system until tomorrow, so for the next 24 hours you should not drive, make any legal decisions or drink any alcoholic beverages.  You may eat anything you prefer, but it is better to start with liquids then soups and crackers, and gradually work up to solid foods.  Please notify your doctor immediately if you have any unusual bleeding, trouble breathing or pain that is not related to your normal pain.  Depending on the type of procedure that was done, some parts of your body may feel week and/or numb.  This usually clears up by tonight or the next day.  Walk with the use of an assistive device or accompanied by an adult for the 24 hours.  You may use ice on the affected area for the first 24 hours.  Put ice in a Ziploc bag and cover with a towel and place against area 15 minutes on 15 minutes off.  You may switch to  heat after 24 hours.GENERAL RISKS AND COMPLICATIONS  What are the risk, side effects and possible complications? Generally speaking, most procedures are safe.  However, with any procedure there are risks, side effects, and the possibility of complications.  The risks and complications are dependent upon the sites that are lesioned, or the type of nerve block to be performed.  The closer the procedure is to the spine, the more serious the risks are.  Great care is taken when placing the radio frequency needles, block needles or lesioning probes, but sometimes complications can occur. 1. Infection: Any time there is an injection through the skin, there is a risk of infection.  This is why sterile conditions are used for these blocks.  There are four possible types of infection. 1. Localized skin infection. 2. Central Nervous System Infection-This can be in the form of Meningitis, which can be deadly. 3. Epidural Infections-This can be in the form of an epidural abscess, which can cause pressure inside of the spine, causing compression of the spinal cord with subsequent paralysis. This would require an emergency surgery to decompress, and there are no guarantees that the patient would recover from the paralysis. 4. Discitis-This is an infection of the intervertebral discs.  It occurs in about 1% of discography procedures.  It is difficult to treat and it may lead to surgery.        2. Pain: the needles have to go through skin and soft tissues, will cause soreness.       3.  Damage to internal structures:  The nerves to be lesioned may be near blood vessels or    other nerves which can be potentially damaged.       4. Bleeding: Bleeding is more common if the patient is taking blood thinners such as  aspirin, Coumadin, Ticiid, Plavix, etc., or if he/she have some genetic predisposition  such as hemophilia. Bleeding into the spinal canal can cause compression of the spinal  cord with subsequent paralysis.   This would require an emergency surgery to  decompress and there are no guarantees that the patient would recover from the  paralysis.       5. Pneumothorax:  Puncturing of a lung is a possibility, every time a needle is introduced in  the area of the chest or upper back.  Pneumothorax refers to free air around the  collapsed lung(s), inside of the thoracic cavity (chest cavity).  Another two possible  complications related to a similar event would include: Hemothorax and Chylothorax.   These are variations of the Pneumothorax, where instead of air around the collapsed  lung(s), you may have blood or chyle, respectively.       6. Spinal headaches: They may occur with any procedures in the area of the spine.       7. Persistent CSF (Cerebro-Spinal Fluid) leakage: This is a rare problem, but may occur  with prolonged intrathecal or epidural catheters either due to the formation of a fistulous  track or a dural tear.       8. Nerve damage: By working so close to the spinal cord, there is always a possibility of  nerve damage, which could be as serious as a permanent spinal cord injury with  paralysis.       9. Death:  Although rare, severe deadly allergic reactions known as "Anaphylactic  reaction" can occur to any of the medications used.      10. Worsening of the symptoms:  We can always make thing worse.  What are the chances of something like this happening? Chances of any of this occuring are extremely low.  By statistics, you have more of a chance of getting killed in a motor vehicle accident: while driving to the hospital than any of the above occurring .  Nevertheless, you should be aware that they are possibilities.  In general, it is similar to taking a shower.  Everybody knows that you can slip, hit your head and get killed.  Does that mean that you should not shower again?  Nevertheless always keep in mind that statistics do not mean anything if you happen to be on the wrong side of them.  Even if  a procedure has a 1 (one) in a 1,000,000 (million) chance of going wrong, it you happen to be that one..Also, keep in mind that by statistics, you have more of a chance of having something go wrong when taking medications.  Who should not have this procedure? If you are on a blood thinning medication (e.g. Coumadin, Plavix, see list of "Blood Thinners"), or if you have an active infection going on, you should not have the procedure.  If you are taking any blood thinners, please inform your physician.  How should I prepare for this procedure?  Do not eat or drink anything at least six hours prior to the procedure.  Bring a driver with you .  It cannot be a taxi.  Come accompanied by an adult that can drive you back, and that  is strong enough to help you if your legs get weak or numb from the local anesthetic.  Take all of your medicines the morning of the procedure with just enough water to swallow them.  If you have diabetes, make sure that you are scheduled to have your procedure done first thing in the morning, whenever possible.  If you have diabetes, take only half of your insulin dose and notify our nurse that you have done so as soon as you arrive at the clinic.  If you are diabetic, but only take blood sugar pills (oral hypoglycemic), then do not take them on the morning of your procedure.  You may take them after you have had the procedure.  Do not take aspirin or any aspirin-containing medications, at least eleven (11) days prior to the procedure.  They may prolong bleeding.  Wear loose fitting clothing that may be easy to take off and that you would not mind if it got stained with Betadine or blood.  Do not wear any jewelry or perfume  Remove any nail coloring.  It will interfere with some of our monitoring equipment.  NOTE: Remember that this is not meant to be interpreted as a complete list of all possible complications.  Unforeseen problems may occur.  BLOOD THINNERS The  following drugs contain aspirin or other products, which can cause increased bleeding during surgery and should not be taken for 2 weeks prior to and 1 week after surgery.  If you should need take something for relief of minor pain, you may take acetaminophen which is found in Tylenol,m Datril, Anacin-3 and Panadol. It is not blood thinner. The products listed below are.  Do not take any of the products listed below in addition to any listed on your instruction sheet.  A.P.C or A.P.C with Codeine Codeine Phosphate Capsules #3 Ibuprofen Ridaura  ABC compound Congesprin Imuran rimadil  Advil Cope Indocin Robaxisal  Alka-Seltzer Effervescent Pain Reliever and Antacid Coricidin or Coricidin-D  Indomethacin Rufen  Alka-Seltzer plus Cold Medicine Cosprin Ketoprofen S-A-C Tablets  Anacin Analgesic Tablets or Capsules Coumadin Korlgesic Salflex  Anacin Extra Strength Analgesic tablets or capsules CP-2 Tablets Lanoril Salicylate  Anaprox Cuprimine Capsules Levenox Salocol  Anexsia-D Dalteparin Magan Salsalate  Anodynos Darvon compound Magnesium Salicylate Sine-off  Ansaid Dasin Capsules Magsal Sodium Salicylate  Anturane Depen Capsules Marnal Soma  APF Arthritis pain formula Dewitt's Pills Measurin Stanback  Argesic Dia-Gesic Meclofenamic Sulfinpyrazone  Arthritis Bayer Timed Release Aspirin Diclofenac Meclomen Sulindac  Arthritis pain formula Anacin Dicumarol Medipren Supac  Analgesic (Safety coated) Arthralgen Diffunasal Mefanamic Suprofen  Arthritis Strength Bufferin Dihydrocodeine Mepro Compound Suprol  Arthropan liquid Dopirydamole Methcarbomol with Aspirin Synalgos  ASA tablets/Enseals Disalcid Micrainin Tagament  Ascriptin Doan's Midol Talwin  Ascriptin A/D Dolene Mobidin Tanderil  Ascriptin Extra Strength Dolobid Moblgesic Ticlid  Ascriptin with Codeine Doloprin or Doloprin with Codeine Momentum Tolectin  Asperbuf Duoprin Mono-gesic Trendar  Aspergum Duradyne Motrin or Motrin IB Triminicin   Aspirin plain, buffered or enteric coated Durasal Myochrisine Trigesic  Aspirin Suppositories Easprin Nalfon Trillsate  Aspirin with Codeine Ecotrin Regular or Extra Strength Naprosyn Uracel  Atromid-S Efficin Naproxen Ursinus  Auranofin Capsules Elmiron Neocylate Vanquish  Axotal Emagrin Norgesic Verin  Azathioprine Empirin or Empirin with Codeine Normiflo Vitamin E  Azolid Emprazil Nuprin Voltaren  Bayer Aspirin plain, buffered or children's or timed BC Tablets or powders Encaprin Orgaran Warfarin Sodium  Buff-a-Comp Enoxaparin Orudis Zorpin  Buff-a-Comp with Codeine Equegesic Os-Cal-Gesic   Buffaprin Excedrin plain, buffered or  Extra Strength Oxalid   Bufferin Arthritis Strength Feldene Oxphenbutazone   Bufferin plain or Extra Strength Feldene Capsules Oxycodone with Aspirin   Bufferin with Codeine Fenoprofen Fenoprofen Pabalate or Pabalate-SF   Buffets II Flogesic Panagesic   Buffinol plain or Extra Strength Florinal or Florinal with Codeine Panwarfarin   Buf-Tabs Flurbiprofen Penicillamine   Butalbital Compound Four-way cold tablets Penicillin   Butazolidin Fragmin Pepto-Bismol   Carbenicillin Geminisyn Percodan   Carna Arthritis Reliever Geopen Persantine   Carprofen Gold's salt Persistin   Chloramphenicol Goody's Phenylbutazone   Chloromycetin Haltrain Piroxlcam   Clmetidine heparin Plaquenil   Cllnoril Hyco-pap Ponstel   Clofibrate Hydroxy chloroquine Propoxyphen         Before stopping any of these medications, be sure to consult the physician who ordered them.  Some, such as Coumadin (Warfarin) are ordered to prevent or treat serious conditions such as "deep thrombosis", "pumonary embolisms", and other heart problems.  The amount of time that you may need off of the medication may also vary with the medication and the reason for which you were taking it.  If you are taking any of these medications, please make sure you notify your pain physician before you undergo any  procedures.

## 2014-11-06 NOTE — Progress Notes (Signed)
Safety precautions to be maintained throughout the outpatient stay will include: orient to surroundings, keep bed in low position, maintain call bell within reach at all times, provide assistance with transfer out of bed and ambulation.  

## 2014-11-07 ENCOUNTER — Telehealth: Payer: Self-pay | Admitting: *Deleted

## 2014-11-07 NOTE — Telephone Encounter (Signed)
Left voice mail

## 2014-12-07 ENCOUNTER — Encounter: Payer: Self-pay | Admitting: Pain Medicine

## 2014-12-07 ENCOUNTER — Ambulatory Visit: Payer: Medicare Other | Admitting: Pain Medicine

## 2014-12-07 ENCOUNTER — Ambulatory Visit: Payer: Medicare Other | Attending: Pain Medicine | Admitting: Pain Medicine

## 2014-12-07 VITALS — BP 132/71 | HR 61 | Temp 95.6°F | Resp 16 | Ht 65.0 in | Wt 180.0 lb

## 2014-12-07 DIAGNOSIS — M47816 Spondylosis without myelopathy or radiculopathy, lumbar region: Secondary | ICD-10-CM

## 2014-12-07 DIAGNOSIS — M4806 Spinal stenosis, lumbar region: Secondary | ICD-10-CM | POA: Insufficient documentation

## 2014-12-07 DIAGNOSIS — M79605 Pain in left leg: Secondary | ICD-10-CM | POA: Diagnosis present

## 2014-12-07 DIAGNOSIS — M5416 Radiculopathy, lumbar region: Secondary | ICD-10-CM | POA: Insufficient documentation

## 2014-12-07 DIAGNOSIS — G588 Other specified mononeuropathies: Secondary | ICD-10-CM | POA: Diagnosis not present

## 2014-12-07 DIAGNOSIS — M533 Sacrococcygeal disorders, not elsewhere classified: Secondary | ICD-10-CM | POA: Diagnosis not present

## 2014-12-07 DIAGNOSIS — M5134 Other intervertebral disc degeneration, thoracic region: Secondary | ICD-10-CM | POA: Insufficient documentation

## 2014-12-07 DIAGNOSIS — M2578 Osteophyte, vertebrae: Secondary | ICD-10-CM | POA: Diagnosis not present

## 2014-12-07 DIAGNOSIS — M48062 Spinal stenosis, lumbar region with neurogenic claudication: Secondary | ICD-10-CM

## 2014-12-07 DIAGNOSIS — M5126 Other intervertebral disc displacement, lumbar region: Secondary | ICD-10-CM | POA: Diagnosis not present

## 2014-12-07 DIAGNOSIS — M47814 Spondylosis without myelopathy or radiculopathy, thoracic region: Secondary | ICD-10-CM

## 2014-12-07 DIAGNOSIS — M47894 Other spondylosis, thoracic region: Secondary | ICD-10-CM

## 2014-12-07 DIAGNOSIS — M5136 Other intervertebral disc degeneration, lumbar region: Secondary | ICD-10-CM | POA: Insufficient documentation

## 2014-12-07 DIAGNOSIS — M545 Low back pain: Secondary | ICD-10-CM | POA: Diagnosis present

## 2014-12-07 DIAGNOSIS — M79604 Pain in right leg: Secondary | ICD-10-CM | POA: Diagnosis present

## 2014-12-07 MED ORDER — GABAPENTIN 400 MG PO CAPS: ORAL_CAPSULE | ORAL | Status: DC

## 2014-12-07 MED ORDER — METHADONE HCL 10 MG PO TABS: ORAL_TABLET | ORAL | Status: DC

## 2014-12-07 NOTE — Patient Instructions (Addendum)
PLAN   Continue present medication Neurontin and methadone  Lumbar epidural steroid injection to be performed at time of return appointment.. Please ask Morrie Sheldon to give you a 10:30 appointment  F/U PCP Dr.Aycock   for evaliation of  BP and general medical  condition  F/U surgical evaluation. May consider pending follow-up evaluations  F/U neurological evaluation. May consider pending follow-up evaluations  May consider radiofrequency rhizolysis or intraspinal procedures pending response to present treatment and F/U evaluation   [facility] to [facility] should patient have concerns prior to scheduled return appointment. GENERAL RISKS AND COMPLICATIONS  What are the risk, side effects and possible complications? Generally speaking, most procedures are safe.  However, with any procedure there are risks, side effects, and the possibility of complications.  The risks and complications are dependent upon the sites that are lesioned, or the type of nerve block to be performed.  The closer the procedure is to the spine, the more serious the risks are.  Great care is taken when placing the radio frequency needles, block needles or lesioning probes, but sometimes complications can occur. 1. Infection: Any time there is an injection through the skin, there is a risk of infection.  This is why sterile conditions are used for these blocks.  There are four possible types of infection. 1. Localized skin infection. 2. Central Nervous System Infection-This can be in the form of Meningitis, which can be deadly. 3. Epidural Infections-This can be in the form of an epidural abscess, which can cause pressure inside of the spine, causing compression of the spinal cord with subsequent paralysis. This would require an emergency surgery to decompress, and there are no guarantees that the patient would recover from the paralysis. 4. Discitis-This is an infection of the intervertebral discs.  It occurs in  about 1% of discography procedures.  It is difficult to treat and it may lead to surgery.        2. Pain: the needles have to go through skin and soft tissues, will cause soreness.       3. Damage to internal structures:  The nerves to be lesioned may be near blood vessels or    other nerves which can be potentially damaged.       4. Bleeding: Bleeding is more common if the patient is taking blood thinners such as  aspirin, Coumadin, Ticiid, Plavix, etc., or if he/she have some genetic predisposition  such as hemophilia. Bleeding into the spinal canal can cause compression of the spinal  cord with subsequent paralysis.  This would require an emergency surgery to  decompress and there are no guarantees that the patient would recover from the  paralysis.       5. Pneumothorax:  Puncturing of a lung is a possibility, every time a needle is introduced in  the area of the chest or upper back.  Pneumothorax refers to free air around the  collapsed lung(s), inside of the thoracic cavity (chest cavity).  Another two possible  complications related to a similar event would include: Hemothorax and Chylothorax.   These are variations of the Pneumothorax, where instead of air around the collapsed  lung(s), you may have blood or chyle, respectively.       6. Spinal headaches: They may occur with any procedures in the area of the spine.       7. Persistent CSF (Cerebro-Spinal Fluid) leakage: This is a rare problem, but may occur  with prolonged intrathecal or epidural catheters either  due to the formation of a fistulous  track or a dural tear.       8. Nerve damage: By working so close to the spinal cord, there is always a possibility of  nerve damage, which could be as serious as a permanent spinal cord injury with  paralysis.       9. Death:  Although rare, severe deadly allergic reactions known as "Anaphylactic  reaction" can occur to any of the medications used.      10. Worsening of the symptoms:  We can always  make thing worse.  What are the chances of something like this happening? Chances of any of this occuring are extremely low.  By statistics, you have more of a chance of getting killed in a motor vehicle accident: while driving to the hospital than any of the above occurring .  Nevertheless, you should be aware that they are possibilities.  In general, it is similar to taking a shower.  Everybody knows that you can slip, hit your head and get killed.  Does that mean that you should not shower again?  Nevertheless always keep in mind that statistics do not mean anything if you happen to be on the wrong side of them.  Even if a procedure has a 1 (one) in a 1,000,000 (million) chance of going wrong, it you happen to be that one..Also, keep in mind that by statistics, you have more of a chance of having something go wrong when taking medications.  Who should not have this procedure? If you are on a blood thinning medication (e.g. Coumadin, Plavix, see list of "Blood Thinners"), or if you have an active infection going on, you should not have the procedure.  If you are taking any blood thinners, please inform your physician.  How should I prepare for this procedure?  Do not eat or drink anything at least six hours prior to the procedure.  Bring a driver with you .  It cannot be a taxi.  Come accompanied by an adult that can drive you back, and that is strong enough to help you if your legs get weak or numb from the local anesthetic.  Take all of your medicines the morning of the procedure with just enough water to swallow them.  If you have diabetes, make sure that you are scheduled to have your procedure done first thing in the morning, whenever possible.  If you have diabetes, take only half of your insulin dose and notify our nurse that you have done so as soon as you arrive at the clinic.  If you are diabetic, but only take blood sugar pills (oral hypoglycemic), then do not take them on the  morning of your procedure.  You may take them after you have had the procedure.  Do not take aspirin or any aspirin-containing medications, at least eleven (11) days prior to the procedure.  They may prolong bleeding.  Wear loose fitting clothing that may be easy to take off and that you would not mind if it got stained with Betadine or blood.  Do not wear any jewelry or perfume  Remove any nail coloring.  It will interfere with some of our monitoring equipment.  NOTE: Remember that this is not meant to be interpreted as a complete list of all possible complications.  Unforeseen problems may occur.  BLOOD THINNERS The following drugs contain aspirin or other products, which can cause increased bleeding during surgery and should not be taken for 2  weeks prior to and 1 week after surgery.  If you should need take something for relief of minor pain, you may take acetaminophen which is found in Tylenol,m Datril, Anacin-3 and Panadol. It is not blood thinner. The products listed below are.  Do not take any of the products listed below in addition to any listed on your instruction sheet.  A.P.C or A.P.C with Codeine Codeine Phosphate Capsules #3 Ibuprofen Ridaura  ABC compound Congesprin Imuran rimadil  Advil Cope Indocin Robaxisal  Alka-Seltzer Effervescent Pain Reliever and Antacid Coricidin or Coricidin-D  Indomethacin Rufen  Alka-Seltzer plus Cold Medicine Cosprin Ketoprofen S-A-C Tablets  Anacin Analgesic Tablets or Capsules Coumadin Korlgesic Salflex  Anacin Extra Strength Analgesic tablets or capsules CP-2 Tablets Lanoril Salicylate  Anaprox Cuprimine Capsules Levenox Salocol  Anexsia-D Dalteparin Magan Salsalate  Anodynos Darvon compound Magnesium Salicylate Sine-off  Ansaid Dasin Capsules Magsal Sodium Salicylate  Anturane Depen Capsules Marnal Soma  APF Arthritis pain formula Dewitt's Pills Measurin Stanback  Argesic Dia-Gesic Meclofenamic Sulfinpyrazone  Arthritis Bayer Timed  Release Aspirin Diclofenac Meclomen Sulindac  Arthritis pain formula Anacin Dicumarol Medipren Supac  Analgesic (Safety coated) Arthralgen Diffunasal Mefanamic Suprofen  Arthritis Strength Bufferin Dihydrocodeine Mepro Compound Suprol  Arthropan liquid Dopirydamole Methcarbomol with Aspirin Synalgos  ASA tablets/Enseals Disalcid Micrainin Tagament  Ascriptin Doan's Midol Talwin  Ascriptin A/D Dolene Mobidin Tanderil  Ascriptin Extra Strength Dolobid Moblgesic Ticlid  Ascriptin with Codeine Doloprin or Doloprin with Codeine Momentum Tolectin  Asperbuf Duoprin Mono-gesic Trendar  Aspergum Duradyne Motrin or Motrin IB Triminicin  Aspirin plain, buffered or enteric coated Durasal Myochrisine Trigesic  Aspirin Suppositories Easprin Nalfon Trillsate  Aspirin with Codeine Ecotrin Regular or Extra Strength Naprosyn Uracel  Atromid-S Efficin Naproxen Ursinus  Auranofin Capsules Elmiron Neocylate Vanquish  Axotal Emagrin Norgesic Verin  Azathioprine Empirin or Empirin with Codeine Normiflo Vitamin E  Azolid Emprazil Nuprin Voltaren  Bayer Aspirin plain, buffered or children's or timed BC Tablets or powders Encaprin Orgaran Warfarin Sodium  Buff-a-Comp Enoxaparin Orudis Zorpin  Buff-a-Comp with Codeine Equegesic Os-Cal-Gesic   Buffaprin Excedrin plain, buffered or Extra Strength Oxalid   Bufferin Arthritis Strength Feldene Oxphenbutazone   Bufferin plain or Extra Strength Feldene Capsules Oxycodone with Aspirin   Bufferin with Codeine Fenoprofen Fenoprofen Pabalate or Pabalate-SF   Buffets II Flogesic Panagesic   Buffinol plain or Extra Strength Florinal or Florinal with Codeine Panwarfarin   Buf-Tabs Flurbiprofen Penicillamine   Butalbital Compound Four-way cold tablets Penicillin   Butazolidin Fragmin Pepto-Bismol   Carbenicillin Geminisyn Percodan   Carna Arthritis Reliever Geopen Persantine   Carprofen Gold's salt Persistin   Chloramphenicol Goody's Phenylbutazone   Chloromycetin  [place] Piroxlcam   Clmetidine heparin Plaquenil   Cllnoril Hyco-pap Ponstel   Clofibrate Hydroxy chloroquine Propoxyphen         Before stopping any of these medications, be sure to consult the physician who ordered them.  Some, such as Coumadin (Warfarin) are ordered to prevent or treat serious conditions such as "deep thrombosis", "pumonary embolisms", and other heart problems.  The amount of time that you may need off of the medication may also vary with the medication and the reason for which you were taking it.  If you are taking any of these medications, please make sure you notify your pain physician before you undergo any procedures.         Epidural Steroid Injection Patient Information  Description: The epidural space surrounds the nerves as they exit the spinal cord.  In some patients, the nerves  can be compressed and inflamed by a bulging disc or a tight spinal canal (spinal stenosis).  By injecting steroids into the epidural space, we can bring irritated nerves into direct contact with a potentially helpful medication.  These steroids act directly on the irritated nerves and can reduce swelling and inflammation which often leads to decreased pain.  Epidural steroids may be injected anywhere along the spine and from the neck to the low back depending upon the location of your pain.   After numbing the skin with local anesthetic (like Novocaine), a small needle is passed into the epidural space slowly.  You may experience a sensation of pressure while this is being done.  The entire block usually last less than 10 minutes.  Conditions which may be treated by epidural steroids:   Low back and leg pain  Neck and arm pain  Spinal stenosis  Post-laminectomy syndrome  Herpes zoster (shingles) pain  Pain from compression fractures  Preparation for the injection:  1. Do not eat any solid food or dairy products within 6 hours of your appointment.  2. You may drink clear  liquids up to 2 hours before appointment.  Clear liquids include water, black coffee, juice or soda.  No milk or cream please. 3. You may take your regular medication, including pain medications, with a sip of water before your appointment  Diabetics should hold regular insulin (if taken separately) and take 1/2 normal NPH dos the morning of the procedure.  Carry some sugar containing items with you to your appointment. 4. A driver must accompany you and be prepared to drive you home after your procedure.  5. Bring all your current medications with your. 6. An IV may be inserted and sedation may be given at the discretion of the physician.   7. A blood pressure cuff, EKG and other monitors will often be applied during the procedure.  Some patients may need to have extra oxygen administered for a short period. 8. You will be asked to provide medical information, including your allergies, prior to the procedure.  We must know immediately if you are taking blood thinners (like Coumadin/Warfarin)  Or if you are allergic to IV iodine contrast (dye). We must know if you could possible be pregnant.  Possible side-effects:  Bleeding from needle site  Infection (rare, may require surgery)  Nerve injury (rare)  Numbness & tingling (temporary)  Difficulty urinating (rare, temporary)  Spinal headache ( a headache worse with upright posture)  Light -headedness (temporary)  Pain at injection site (several days)  Decreased blood pressure (temporary)  Weakness in arm/leg (temporary)  Pressure sensation in back/neck (temporary)  Call if you experience:  Fever/chills associated with headache or increased back/neck pain.  Headache worsened by an upright position.  New onset weakness or numbness of an extremity below the injection site  Hives or difficulty breathing (go to the emergency room)  Inflammation or drainage at the infection site  Severe back/neck pain  Any new symptoms which are  concerning to you  Please note:  Although the local anesthetic injected can often make your back or neck feel good for several hours after the injection, the pain will likely return.  It takes 3-7 days for steroids to work in the epidural space.  You may not notice any pain relief for at least that one week.  If effective, we will often do a series of three injections spaced 3-6 weeks apart to maximally decrease your pain.  After the initial  series, we generally will wait several months before considering a repeat injection of the same type.  If you have any questions, please call [phone] [facility]

## 2014-12-07 NOTE — Progress Notes (Signed)
Subjective:    Patient ID: Cheyenne Gray, female    DOB: May 16, 1965, 49 y.o.   MRN: [id number]  HPI Patient is 49 year old female who [facility] evaluation and treatment of pain involving the lumbar and lower extremity region. Patient has had significant improvement of her pain with previous lumbar facet, medial branch nerve, blocks. Patient with greater than 50% relief of pain following the procedures. Patient states that her pain is associated with increased weakness of the extremities with standing and walking. Patient also admits to carrying a backpack. Patient states that she carries backpacked since school has started. We explained to patient that the wake is putting extra stress on the spine which will aggravate and increase the pain due to the degenerative disc disease and facet changes and other changes of the spinearea patient admitted to numbness and weakness of the lower extremities with prolonged standing and walking. We discussed patient's condition and will consider patient for lumbar epidural steroid injection to be performed at time return appointment. The patient was in agreement with suggested treatment plan. The patient will continue Neurontin and methadone as prescribed. Patient tolerating medications well without undesirable side effects. We'll also discussed further evaluation including surgical evaluation and will consider further surgical evaluation as discussed pending response to present treatment and follow-up evaluation.   Review of Systems     Objective:   Physical Exam There was tenderness over the splenius capitis and occipitalis musculature region. Palpation of these regions reproduced mild discomfort. There was mild tenderness to palpation of the cervical facet cervical paraspinal musculature region as well as the thoracic facet thoracic paraspinal musculature region. There appeared to be unremarkable Spurling's maneuver. There  was bilaterally equal grip strength. Tinel and Phalen's maneuver without increased pain of any significant degree.palpation of the thoracic facet thoracic paraspinal muscles region was with tinged palpation of mild to moderate degree. No crepitus of the thoracic region noted. There was tends to palpation over the lumbar paraspinal muscular region lumbar facet region a moderate degree. Lateral bending and rotation extension and palpation of the lumbar facets reproduce moderate discomfort. Straight leg raising was tolerates approximately 20 without a definite increased pain with dorsiflexion noted. There was negative clonus negative Homans. DTRs were difficult to elicit patient had difficulty relaxing. No definite sensory deficit of dermatomal distribution detected. There was negative clonus negative Homans. Abdomen was nontender with no costovertebral angle tenderness noted.     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  L4-L5 bulge with superimposed central disc protrusion slightly greater to the left. Indentation ventral aspect of the thecal sac slightly greater to the left. Facet degenerative changes. L5-S1 mild bulge with osteophyte with foraminal/lateral extension slightly greater on the left. Mild encroachment upon but not compression of the exiting L5 nerve roots. Facet joint degenerative changes.  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 May consider pending follow-up evaluations  F/U neurological  evaluation. May consider pending follow-up evaluations  May consider radiofrequency rhizolysis or intraspinal procedures pending response to present treatment and F/U  evaluation   Patient to call [facility] should patient have concerns prior to scheduled return appointment.

## 2014-12-07 NOTE — Progress Notes (Signed)
Safety precautions to be maintained throughout the outpatient stay will include: orient to surroundings, keep bed in low position, maintain call bell within reach at all times, provide assistance with transfer out of bed and ambulation.  

## 2014-12-08 ENCOUNTER — Ambulatory Visit: Payer: Medicare Other | Admitting: Physician Assistant

## 2014-12-12 ENCOUNTER — Ambulatory Visit (INDEPENDENT_AMBULATORY_CARE_PROVIDER_SITE_OTHER): Payer: Medicare Other | Admitting: Physician Assistant

## 2014-12-12 ENCOUNTER — Inpatient Hospital Stay: Payer: Medicare Other

## 2014-12-12 ENCOUNTER — Inpatient Hospital Stay: Payer: Medicare Other | Attending: Internal Medicine

## 2014-12-12 ENCOUNTER — Encounter: Payer: Self-pay | Admitting: Physician Assistant

## 2014-12-12 ENCOUNTER — Other Ambulatory Visit
Admission: RE | Admit: 2014-12-12 | Discharge: 2014-12-12 | Disposition: A | Discharge status: Home / Self Care | Payer: Medicare Other | Source: Ambulatory Visit | Attending: Cardiovascular Disease | Admitting: Cardiovascular Disease

## 2014-12-12 VITALS — BP 110/70 | HR 79 | Ht 65.0 in | Wt 192.8 lb

## 2014-12-12 DIAGNOSIS — I4729 Other ventricular tachycardia: Secondary | ICD-10-CM

## 2014-12-12 DIAGNOSIS — R0602 Shortness of breath: Secondary | ICD-10-CM | POA: Diagnosis not present

## 2014-12-12 DIAGNOSIS — D509 Iron deficiency anemia, unspecified: Secondary | ICD-10-CM | POA: Diagnosis not present

## 2014-12-12 DIAGNOSIS — R5382 Chronic fatigue, unspecified: Secondary | ICD-10-CM

## 2014-12-12 DIAGNOSIS — R079 Chest pain, unspecified: Secondary | ICD-10-CM | POA: Insufficient documentation

## 2014-12-12 DIAGNOSIS — R0789 Other chest pain: Secondary | ICD-10-CM | POA: Diagnosis not present

## 2014-12-12 DIAGNOSIS — I472 Ventricular tachycardia: Secondary | ICD-10-CM

## 2014-12-12 DIAGNOSIS — I493 Ventricular premature depolarization: Secondary | ICD-10-CM

## 2014-12-12 DIAGNOSIS — I4581 Long QT syndrome: Secondary | ICD-10-CM

## 2014-12-12 LAB — HEMOGLOBIN: Hemoglobin: 13 g/dL (ref 12.0–16.0)

## 2014-12-12 LAB — FERRITIN: Ferritin: 88 ng/mL (ref 11–307)

## 2014-12-12 LAB — IRON AND TIBC
Iron: 54 ug/dL (ref 28–170)
SATURATION RATIOS: 17 % (ref 10.4–31.8)
TIBC: 316 ug/dL (ref 250–450)
UIBC: 262 ug/dL

## 2014-12-12 LAB — BASIC METABOLIC PANEL
Anion gap: 6 (ref 5–15)
BUN: 6 mg/dL (ref 6–20)
CALCIUM: 8.6 mg/dL — AB (ref 8.9–10.3)
CO2: 28 mmol/L (ref 22–32)
Chloride: 102 mmol/L (ref 101–111)
Creatinine, Ser: 0.83 mg/dL (ref 0.44–1.00)
GLUCOSE: 76 mg/dL (ref 65–99)
Potassium: 3.7 mmol/L (ref 3.5–5.1)
SODIUM: 136 mmol/L (ref 135–145)

## 2014-12-12 NOTE — Patient Instructions (Addendum)
Medication Instructions:  None  Labwork: Take orders for BMET to the cancer center   Testing/Procedures: Your physician has requested that you have an echocardiogram. Echocardiography is a painless test that uses sound waves to create images of your heart. It provides your doctor with information about the size and shape of your heart and how well your heart's chambers and valves are working. This procedure takes approximately one hour. There are no restrictions for this procedure.  [facility] MYOVIEW  Your caregiver has ordered a Stress Test with nuclear imaging. The purpose of this test is to evaluate the blood supply to your heart muscle. This procedure is referred to as a "Non-Invasive Stress Test." This is because other than having an IV started in your vein, nothing is inserted or "invades" your body. Cardiac stress tests are done to find areas of poor blood flow to the heart by determining the extent of coronary artery disease (CAD). Some patients exercise on a treadmill, which naturally increases the blood flow to your heart, while others who are  unable to walk on a treadmill due to physical limitations have a pharmacologic/chemical stress agent called Lexiscan . This medicine will mimic walking on a treadmill by temporarily increasing your coronary blood flow.   Please note: these test may take anywhere between 2-4 hours to complete  PLEASE REPORT TO [facility] MEDICAL MALL ENTRANCE  THE VOLUNTEERS AT THE FIRST DESK WILL DIRECT YOU WHERE TO GO  Date of Procedure:____Tuesday, October 4_______  Arrival Time for Procedure:_____7:15 am_________  How to prepare for your Myoview test:   Do not eat or drink after midnight  No caffeine for 24 hours prior to test  No smoking 24 hours prior to test.  Your medication may be taken with water.  If your doctor stopped a medication because of this test, do not take that medication.  Ladies, please do not wear dresses.  Skirts or pants are  appropriate. Please wear a short sleeve shirt.  No perfume, cologne or lotion.  Wear comfortable walking shoes. No heels!  Follow-Up: 4-6 weeks  Any Other Special Instructions Will Be Listed Below:    Cardiac Nuclear Scanning A cardiac nuclear scan is used to check your heart for problems, such as the following:  A portion of the heart is not getting enough blood.  Part of the heart muscle has died, which happens with a heart attack.  The heart wall is not working normally.  In this test, a radioactive dye (tracer) is injected into your bloodstream. After the tracer has traveled to your heart, a scanning device is used to measure how much of the tracer is absorbed by or distributed to various areas of your heart. LET [facility] CARE PROVIDER KNOW ABOUT:  Any allergies you have.  All medicines you are taking, including vitamins, herbs, eye drops, creams, and over-the-counter medicines.  Previous problems you or members of your family have had with the use of anesthetics.  Any blood disorders you have.  Previous surgeries you have had.  Medical conditions you have.  RISKS AND COMPLICATIONS Generally, this is a safe procedure. However, as with any procedure, problems can occur. Possible problems include:   Serious chest pain.  Rapid heartbeat.  Sensation of warmth in your chest. This usually passes quickly. BEFORE THE PROCEDURE Ask your health care provider about changing or stopping your regular medicines. PROCEDURE This procedure is usually done at a hospital and takes 2-4 hours.  An IV tube is inserted into one of  your veins.  Your health care provider will inject a small amount of radioactive tracer through the tube.  You will then wait for 20-40 minutes while the tracer travels through your bloodstream.  You will lie down on an exam table so images of your heart can be taken. Images will be taken for about 15-20 minutes.  You will exercise on a treadmill  or stationary bike. While you exercise, your heart activity will be monitored with an electrocardiogram (ECG), and your blood pressure will be checked.  If you are unable to exercise, you may be given a medicine to make your heart beat faster.  When blood flow to your heart has peaked, tracer will again be injected through the IV tube.  After 20-40 minutes, you will get back on the exam table and have more images taken of your heart.  When the procedure is over, your IV tube will be removed. AFTER THE PROCEDURE  You will likely be able to leave shortly after the test. Unless your health care provider tells you otherwise, you may return to your normal schedule, including diet, activities, and medicines.  Make sure you find out how and when you will get your test results. Document Released: 03/28/2004 Document Revised: 03/08/2013 Document Reviewed: 02/09/2013 [facility] Patient Information 2015 [place], [place]. This information is not intended to replace advice given to you by your health care provider. Make sure you discuss any questions you have with your health care provider. Echocardiogram An echocardiogram, or echocardiography, uses sound waves (ultrasound) to produce an image of your heart. The echocardiogram is simple, painless, obtained within a short period of time, and offers valuable information to your health care provider. The images from an echocardiogram can provide information such as:  Evidence of coronary artery disease (CAD).  Heart size.  Heart muscle function.  Heart valve function.  Aneurysm detection.  Evidence of a past heart attack.  Fluid buildup around the heart.  Heart muscle thickening.  Assess heart valve function. LET [facility] CARE PROVIDER KNOW ABOUT:  Any allergies you have.  All medicines you are taking, including vitamins, herbs, eye drops, creams, and over-the-counter medicines.  Previous problems you or members of your family have had with  the use of anesthetics.  Any blood disorders you have.  Previous surgeries you have had.  Medical conditions you have.  Possibility of pregnancy, if this applies. BEFORE THE PROCEDURE  No special preparation is needed. Eat and drink normally.  PROCEDURE   In order to produce an image of your heart, gel will be applied to your chest and a wand-like tool (transducer) will be moved over your chest. The gel will help transmit the sound waves from the transducer. The sound waves will harmlessly bounce off your heart to allow the heart images to be captured in real-time motion. These images will then be recorded.  You may need an IV to receive a medicine that improves the quality of the pictures. AFTER THE PROCEDURE You may return to your normal schedule including diet, activities, and medicines, unless your health care provider tells you otherwise. Document Released: 02/29/2000 Document Revised: 07/18/2013 Document Reviewed: 11/08/2012 [facility] Patient Information 2015 [place], [place]. This information is not intended to replace advice given to you by your health care provider. Make sure you discuss any questions you have with your health care provider.

## 2014-12-12 NOTE — Progress Notes (Signed)
Cardiology Office Note:  Date of Encounter: 12/12/2014  ID: Cheyenne Gray, DOB 07/31/1965, MRN [id number]  PCP:  Emogene Morgan, MD Primary Cardiologist:  Dr. Mariah Milling, MD  Chief Complaint  Patient presents with  . other    Follow up from [facility]; patient was [facility] for hand foot and mouth, the physician told her she needed to follow up with the Cardiologist soon.  Pt. c/o chest pain that radiates to her arm on occasions.     HPI:  49 year old female with history of prolonged QT, tachy-palpitations, obesity s/p gastric bypass in 2003, on methadone, chronic pain, history of syncopal episode while standing in doorway, migraine disorder, depression, atypical chest pain, asthma and strong family history of CAD who presents to clinic today for evaluation of left sided chest pain and increased fatigue.   During hospitalization in 03/2013 she had CT of chest that showed coronary and aortic calcifications with mild to moderate coronary calcifications seen in the mid to distal RCA. LAD and LCx were not visible. There was minimal plaque in the descending aorta with mild to moderate plaque in the proximal iliac arteries bilaterally. No formal stress testing or cardiac catheterizations. Echo in 07/2012 showed EF of 65-70%, normal wall motion, left atrium was mildly dilated, and PASP normal.    She has known tachy-palpitations which she has previously taken metoprolol bid though stopped 2/2 reported loose stools. At her last follow up with Dr. Mariah Milling in 01/2014 she reported continued symptomatic episodes of palpitations and tachycardia. She wanted to wear another monitor at that time. She previously wore a 24 hour Holter in 06/2012 that showed sinus rhythm, sinus brady, occasional PVC, and an 8 beat run of NSVT. Her repeat 30 day event monitor in 02/2014 showed sinus rhythm with rare PVC's. No recent pre-syncope or syncope.   She has previously been noted to have issues with sleep and been  tried on Zoloft but self discontinued 2/2 she did not like the way it made her feel.   She presented [facility] in July 2016 for evaluation of question hand-foot-and mouth disease. This was followed up by an ED visit on 7/28 for sunburn in which she was treated with aloe vera and lotion.   Today, she reports having had intermittent chest pain over the summer, most recently on 12/09/2014 while at rest. Chest pain lasted 20 minutes and self resolved. Some associated diaphoresis, malaise, and weakness that is not at the same time as her chest pain. Chest pain is located along the left side of her chest and does not radiate. Pain is sharp in character. She does note some increased fatigue with exertion as of late. She is up 7 pounds from her November 2015 visit. She denies any orthopnea, PND, LEE, or cough. She has fully gotten over her hand-foot-and mouth illness. She states she felt tired for one week, then was ok. She is currently without any symptoms.      Past Medical History  Diagnosis Date  . Chronic pain     a. on methadone  . DJD (degenerative joint disease)   . Iron deficiency anemia   . Long QT interval   . Syncope and collapse   . Iron deficiency   . History of echocardiogram     a. echo 2014: EF 65-70%, nl WM, mildly dilated LA, PASP nl  . Arthritis   . Asthma   . Obesity   . Migraine   .  Psoriasis   . Chronic back pain   . DJD (degenerative joint disease), multiple sites   . B12 deficiency   . Vitamin D deficiency   . Depression   . History of shingles   . Palpitations     a. 24 hour Holter: NSR, sinus brady down to 48, occasional PVCs & couplets, 8 beats NSVT; b. 30 day event monitor 2015: NSR with rare PVC  :  Past Surgical History  Procedure Laterality Date  . Bariatric surgery  2001  . Cholecystectomy  2001  . Gastroplasty    . Gastric bypass    . Gallbladder surgery    :  Social History:  The patient  reports that she has never smoked. She does not  have any smokeless tobacco history on file. She reports that she drinks alcohol. She reports that she does not use illicit drugs.   Family History  Problem Relation Age of Onset  . Heart attack Mother   . Cancer Mother   . Heart attack Father 27    MI  . Heart attack Brother   . Heart disease Brother   . Heart attack Maternal Grandmother   . Cancer Maternal Grandmother   . Cancer Maternal Uncle      Allergies:  Allergies  Allergen Reactions  . Penicillins     REACTION: Rash, Hives, S.O.B.     Home Medications:  Current Outpatient Prescriptions  Medication Sig Dispense Refill  . albuterol (PROVENTIL HFA;VENTOLIN HFA) 108 (90 BASE) MCG/ACT inhaler Inhale 2 puffs into the lungs as needed for wheezing.    . cyclobenzaprine (FLEXERIL) 10 MG tablet Take 10 mg by mouth at bedtime as needed for muscle spasms.    Marland Kitchen gabapentin (NEURONTIN) 400 MG capsule Limit 2 tablets in the a.m. and midday and 3 tablets each evening 210 capsule 0  . methadone (DOLOPHINE) 10 MG tablet Limit 1-2 tablets by mouth 2-3 times per day if tolerated 180 tablet 0  . SUMAtriptan (IMITREX) 25 MG tablet Take 25 mg by mouth every 2 (two) hours as needed for migraine. May repeat in 2 hours if headache persists or recurs.     No current facility-administered medications for this visit.     Review of Systems:  Review of Systems  Constitutional: Positive for malaise/fatigue and diaphoresis. Negative for fever, chills and weight loss.  HENT: Positive for congestion. Negative for sore throat.   Eyes: Negative for discharge and redness.  Respiratory: Positive for shortness of breath. Negative for cough, hemoptysis, sputum production and wheezing.   Cardiovascular: Positive for chest pain. Negative for palpitations, orthopnea, claudication, leg swelling and PND.  Gastrointestinal: Negative for heartburn, nausea, vomiting and abdominal pain.  Musculoskeletal: Negative for falls.  Skin: Negative for rash.    Neurological: Positive for weakness and headaches. Negative for sensory change, speech change and focal weakness.  Endo/Heme/Allergies: Does not bruise/bleed easily.  Psychiatric/Behavioral: Negative for substance abuse. The patient is nervous/anxious.      Physical Exam:  Height  (1.651 m), weight 192 lb 12 oz (87.431 kg), last menstrual period 11/06/2014. BMI: Body mass index is 32.08 kg/(m^2). General: Pleasant, NAD. Psych: Normal affect. Responds to questions with normal affect.  Neuro: Alert and oriented X 3. Moves all extremities spontaneously. HEENT: Normocephalic, atraumatic. EOM intact. Sclera anicteric.  Neck: Trachea midline. Supple without bruits or JVD. Lungs:  Respirations regular and unlabored. CTA bilaterally without wheezing, crackles, or rhonchi.  Heart: RRR, normal s3, s4. No murmurs, rubs, or gallops.  Abdomen: Obese, soft, non-tender, non-distended, BS + x 4.  Extremities: No clubbing, cyanosis or edema. DP/PT/Radials 2+ and equal bilaterally.   Accessory Clinical Findings:  EKG: sinus rhythm with 1st degree AV block (PR 214 ms), rare PVC, nonspecific lateral st/t changes    Recent Labs: 09/19/2014: Hemoglobin 12.2; Platelets 204  01/10/2014: Chol/HDL Ratio 3.0; Cholesterol, Total 187; HDL 63; LDL Calculated 101*; Triglycerides 115  CrCl cannot be calculated (Patient has no serum creatinine result on file.).  Weights: Wt Readings from Last 3 Encounters:  12/12/14 192 lb 12 oz (87.431 kg)  12/07/14 180 lb (81.647 kg)  11/06/14 180 lb (81.647 kg)    Other studies Reviewed: Additional studies/ records that were reviewed today include: multiple office notes.  Assessment & Plan:  1. Chest pain with CAD previously seen on CT scan: -Evaluate on treadmill Myoview for high risk ischemia  -Check echo as below -Continue aggressive primary prevention methods, though she is no longer taking Lipitor -Would restart Lipitor 10 mg daily    2. Increased  fatigue: -Long standing issue for her -Check echo to evaluate LV function, wall motion, and right-sided pressure along with the above nuclear stress test  -Possible underlying issues of insomnia/poor sleep hygiene  -Check CBC, iron, and bmet   3. History of NSVT: -No recent palpitations -She asks again about wearing another cardiac monitor -Doubt this would be helpful at this time unless echo showed depressed EF and stress test was normal to evaluate for increased ventricular ectopy, as she has ben asymptomatic  -No longer taking propranolol prn  4. HLD: -Recommend she restart Lipitor 10 mg daily  5. Hand-foot-mouth: -Resolved -Check echo as above  Dispo: -Follow up post stress testing   Current medicines are reviewed at length with the patient today.  The patient did not have any concerns regarding medicines.   Eula Listen, PA-C [facility] [address] [place], [place] [address] [phone] [facility] 12/12/2014, 1:41 PM

## 2014-12-13 ENCOUNTER — Telehealth: Payer: Self-pay | Admitting: *Deleted

## 2014-12-13 ENCOUNTER — Emergency Department
Admission: EM | Admit: 2014-12-13 | Discharge: 2014-12-13 | Disposition: A | Discharge status: Home / Self Care | Payer: Medicare Other | Attending: Emergency Medicine | Admitting: Emergency Medicine

## 2014-12-13 ENCOUNTER — Emergency Department: Payer: Medicare Other

## 2014-12-13 ENCOUNTER — Other Ambulatory Visit: Payer: Self-pay

## 2014-12-13 ENCOUNTER — Encounter: Payer: Self-pay | Admitting: Emergency Medicine

## 2014-12-13 DIAGNOSIS — R091 Pleurisy: Secondary | ICD-10-CM | POA: Insufficient documentation

## 2014-12-13 DIAGNOSIS — R079 Chest pain, unspecified: Secondary | ICD-10-CM | POA: Diagnosis present

## 2014-12-13 DIAGNOSIS — J159 Unspecified bacterial pneumonia: Secondary | ICD-10-CM | POA: Diagnosis not present

## 2014-12-13 DIAGNOSIS — H578 Other specified disorders of eye and adnexa: Secondary | ICD-10-CM | POA: Diagnosis not present

## 2014-12-13 DIAGNOSIS — J189 Pneumonia, unspecified organism: Secondary | ICD-10-CM

## 2014-12-13 DIAGNOSIS — Z880 Allergy status to penicillin: Secondary | ICD-10-CM | POA: Diagnosis not present

## 2014-12-13 LAB — BASIC METABOLIC PANEL
ANION GAP: 6 (ref 5–15)
BUN: <5 mg/dL — ABNORMAL LOW (ref 6–20)
CHLORIDE: 105 mmol/L (ref 101–111)
CO2: 30 mmol/L (ref 22–32)
Calcium: 9.3 mg/dL (ref 8.9–10.3)
Creatinine, Ser: 0.8 mg/dL (ref 0.44–1.00)
GFR calc non Af Amer: >60 mL/min (ref >60)
Glucose, Bld: 81 mg/dL (ref 65–99)
Potassium: 4.6 mmol/L (ref 3.5–5.1)
Sodium: 141 mmol/L (ref 135–145)

## 2014-12-13 LAB — TROPONIN I

## 2014-12-13 LAB — CBC
HCT: 40.7 % (ref 35.0–47.0)
HEMOGLOBIN: 13.7 g/dL (ref 12.0–16.0)
MCH: 31.1 pg (ref 26.0–34.0)
MCHC: 33.7 g/dL (ref 32.0–36.0)
MCV: 92.3 fL (ref 80.0–100.0)
Platelets: 281 10*3/uL (ref 150–440)
RBC: 4.41 MIL/uL (ref 3.80–5.20)
RDW: 13.6 % (ref 11.5–14.5)
WBC: 12.8 10*3/uL — ABNORMAL HIGH (ref 3.6–11.0)

## 2014-12-13 LAB — FIBRIN DERIVATIVES D-DIMER (ARMC ONLY): Fibrin derivatives D-dimer (ARMC): 658 — ABNORMAL HIGH (ref 0–499)

## 2014-12-13 MED ORDER — POLYMYXIN B-TRIMETHOPRIM 10000-0.1 UNIT/ML-% OP SOLN: 2.0000 [drp] | OPHTHALMIC | Status: DC

## 2014-12-13 MED ORDER — IOHEXOL 350 MG/ML SOLN
100.0000 mL | Freq: Once | INTRAVENOUS | Status: DC | PRN
  Filled 2014-12-13: qty 100

## 2014-12-13 MED ORDER — HYDROCOD POLST-CPM POLST ER 10-8 MG/5ML PO SUER: 5.0000 mL | Freq: Two times a day (BID) | ORAL | Status: DC

## 2014-12-13 MED ORDER — LEVOFLOXACIN 500 MG PO TABS: 500.0000 mg | ORAL_TABLET | Freq: Every day | ORAL | Status: AC

## 2014-12-13 MED ORDER — LEVOFLOXACIN 750 MG PO TABS
750.0000 mg | ORAL_TABLET | Freq: Once | ORAL | Status: AC
  Administered 2014-12-13: 750 mg via ORAL

## 2014-12-13 MED ORDER — HYDROCOD POLST-CPM POLST ER 10-8 MG/5ML PO SUER
ORAL | Status: AC
  Filled 2014-12-13: qty 5

## 2014-12-13 MED ORDER — HYDROCOD POLST-CPM POLST ER 10-8 MG/5ML PO SUER
5.0000 mL | Freq: Once | ORAL | Status: AC
  Administered 2014-12-13: 5 mL via ORAL

## 2014-12-13 NOTE — Discharge Instructions (Signed)
Pleurisy Pleurisy is redness, puffiness (swelling), and soreness (inflammation) of the lining of the lungs. It can be hard to breathe and hurt to breathe. Coughing or deep breathing will make it hurt more. It is often caused by an existing infection or disease.  HOME CARE  Only take medicine as told by your doctor.  Only take antibiotic medicine as directed. Make sure to finish it even if you start to feel better. GET HELP RIGHT AWAY IF:   Your lips, fingernails, or toenails are blue or dark.  You cough up blood.  You have a hard time breathing.  Your pain is not controlled with medicine or it lasts for more than 1 week.  Your pain spreads (radiates) into your neck, arms, or jaw.  You are short of breath or wheezing.  You develop a fever, rash, throw up (vomit), or faint. MAKE SURE YOU:   Understand these instructions.  Will watch your condition.  Will get help right away if you are not doing well or get worse. Document Released: 02/14/2008 Document Revised: 11/03/2012 Document Reviewed: 08/15/2012 [facility] Patient Information 2015 [place], [place]. This information is not intended to replace advice given to you by your health care provider. Make sure you discuss any questions you have with your health care provider.  Pneumonia Pneumonia is an infection of the lungs.  CAUSES Pneumonia may be caused by bacteria or a virus. Usually, these infections are caused by breathing infectious particles into the lungs (respiratory tract). SIGNS AND SYMPTOMS   Cough.  Fever.  Chest pain.  Increased rate of breathing.  Wheezing.  Mucus production. DIAGNOSIS  If you have the common symptoms of pneumonia, your health care provider will typically confirm the diagnosis with a chest X-ray. The X-ray will show an abnormality in the lung (pulmonary infiltrate) if you have pneumonia. Other tests of your blood, urine, or sputum may be done to find the specific cause of your pneumonia. Your  health care provider may also do tests (blood gases or pulse oximetry) to see how well your lungs are working. TREATMENT  Some forms of pneumonia may be spread to other people when you cough or sneeze. You may be asked to wear a mask before and during your exam. Pneumonia that is caused by bacteria is treated with antibiotic medicine. Pneumonia that is caused by the influenza virus may be treated with an antiviral medicine. Most other viral infections must run their course. These infections will not respond to antibiotics.  HOME CARE INSTRUCTIONS   Cough suppressants may be used if you are losing too much rest. However, coughing protects you by clearing your lungs. You should avoid using cough suppressants if you can.  Your health care provider may have prescribed medicine if he or she thinks your pneumonia is caused by bacteria or influenza. Finish your medicine even if you start to feel better.  Your health care provider may also prescribe an expectorant. This loosens the mucus to be coughed up.  Take medicines only as directed by your health care provider.  Do not smoke. Smoking is a common cause of bronchitis and can contribute to pneumonia. If you are a smoker and continue to smoke, your cough may last several weeks after your pneumonia has cleared.  A cold steam vaporizer or humidifier in your room or home may help loosen mucus.  Coughing is often worse at night. Sleeping in a semi-upright position in a recliner or using a couple pillows under your head will help with  this.  Get rest as you feel it is needed. Your body will usually let you know when you need to rest. PREVENTION A pneumococcal shot (vaccine) is available to prevent a common bacterial cause of pneumonia. This is usually suggested for:  People over 38 years old.  Patients on chemotherapy.  People with chronic lung problems, such as bronchitis or emphysema.  People with immune system problems. If you are over 65 or  have a high risk condition, you may receive the pneumococcal vaccine if you have not received it before. In some countries, a routine influenza vaccine is also recommended. This vaccine can help prevent some cases of pneumonia.You may be offered the influenza vaccine as part of your care. If you smoke, it is time to quit. You may receive instructions on how to stop smoking. Your health care provider can provide medicines and counseling to help you quit. SEEK MEDICAL CARE IF: You have a fever. SEEK IMMEDIATE MEDICAL CARE IF:   Your illness becomes worse. This is especially true if you are elderly or weakened from any other disease.  You cannot control your cough with suppressants and are losing sleep.  You begin coughing up blood.  You develop pain which is getting worse or is uncontrolled with medicines.  Any of the symptoms which initially brought you in for treatment are getting worse rather than better.  You develop shortness of breath or chest pain. MAKE SURE YOU:   Understand these instructions.  Will watch your condition.  Will get help right away if you are not doing well or get worse. Document Released: 03/03/2005 Document Revised: 07/18/2013 Document Reviewed: 05/23/2010 [facility] Patient Information 2015 [place], [place]. This information is not intended to replace advice given to you by your health care provider. Make sure you discuss any questions you have with your health care provider.

## 2014-12-13 NOTE — Telephone Encounter (Signed)
Spoke w/ pt.  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  She states that her HR is fast and she is sweating through to her underwear. She reports that she was told that she looked pale and she looked like she was about to pass out.  Pt does not know how to check her pulse, she asks someone w/ her to check it.  Reports HR 95, she does not have a BP cuff. Pt reports active left sided chest pain, SOB, lightheaded, diaphoretic and feels like an elephant is sitting on her chest.  Advised pt to call 911 to have them do an EKG or have someone take her to the ED.  She states that she would prefer to go to the ED, as she does not want the attention of ambulance coming to her.

## 2014-12-13 NOTE — ED Notes (Signed)
Pt to ed with c/o chest pain ;left side of chest that started yesterday and then worse today,  Was seen at dr Ethelene Hal office yesterday,  Pt states today, chest pain and diaphoresis and sob.

## 2014-12-13 NOTE — Telephone Encounter (Signed)
Pt calling stating she was seen yesterday by [place], she is sweating so bad that her underwear is drenched and her HR is going weirdly fast.  Pt states she just found out this morning she has pink eye and now she's feeling her heart racing.  She asking if we need to wait until Tuesday and or get a monitor now, she is being sent home from work, and did not want to go ED unless we tell her Usually her hr isn't as bad as it is at the moment, she was a bit light headed at work.  Please advise.

## 2014-12-13 NOTE — ED Provider Notes (Signed)
[facility] Emergency Department Provider Note     Time seen: ----------------------------------------- 4:37 PM on 12/13/2014 -----------------------------------------    I have reviewed the triage vital signs and the nursing notes.   HISTORY  Chief Complaint Chest Pain    HPI Cheyenne Gray is a 49 y.o. female who presents ER for left-sided chest pain. Patient states the pain started yesterday and was worse today, she was seen by cardiologist office yesterday. Patient states today the pain is worse when she takes a deep breath, she also broke out into a sweat.She has never had a heart attack, nothing makes her symptoms better. Breathing seems to make her symptoms worse.   Past Medical History  Diagnosis Date  . Chronic pain     a. on methadone  . DJD (degenerative joint disease)   . Iron deficiency anemia   . Long QT interval   . Syncope and collapse   . Iron deficiency   . History of echocardiogram     a. echo 2014: EF 65-70%, nl WM, mildly dilated LA, PASP nl  . Arthritis   . Asthma   . Obesity   . Migraine   . Psoriasis   . Chronic back pain   . DJD (degenerative joint disease), multiple sites   . B12 deficiency   . Vitamin D deficiency   . Depression   . History of shingles   . Palpitations     a. 24 hour Holter: NSR, sinus brady down to 48, occasional PVCs & couplets, 8 beats NSVT; b. 30 day event monitor 2015: NSR with rare PVC  . Hand, foot and mouth disease 2016    Patient Active Problem List   Diagnosis Date Noted  . IDA (iron deficiency anemia) 09/19/2014  . 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 DDD (degenerative disc disease), lumbar 09/14/2014  . Sacroiliac joint dysfunction 09/14/2014  . Thoracic facet syndrome 09/14/2014  . Facet syndrome, lumbar 09/14/2014  . Hyperlipidemia 01/25/2014  . PVC (premature ventricular contraction) 01/10/2014  . Fatigue 05/25/2013  . CAD (coronary artery disease)  04/22/2013  . PVD (peripheral vascular disease) 04/22/2013  . Long Q-T syndrome 07/09/2012  . Chest pressure 07/09/2012  . NSVT (nonsustained ventricular tachycardia) 07/09/2012  . Syncope 07/09/2012  . MUSCLE STRAIN 07/20/2009    Past Surgical History  Procedure Laterality Date  . Bariatric surgery  2001  . Cholecystectomy  2001  . Gastroplasty    . Gastric bypass    . Gallbladder surgery      Allergies Penicillins  Social History Social History  Substance Use Topics  . Smoking status: Never Smoker   . Smokeless tobacco: None  . Alcohol Use: Yes    Review of Systems Constitutional: Negative for fever. Eyes: Positive for eye redness ENT: Negative for sore throat. Cardiovascular: Positive for left-sided chest pain Respiratory: Negative for shortness of breath. Positive for pleuritic pain Gastrointestinal: Negative for abdominal pain, vomiting and diarrhea. Genitourinary: Negative for dysuria. Musculoskeletal: Negative for back pain. Skin: Negative for rash. Neurological: Negative for headaches, positive for weakness  10-point ROS otherwise negative.  ____________________________________________   PHYSICAL EXAM:  VITAL SIGNS: ED Triage Vitals  Enc Vitals Group     BP 12/13/14 1443 141/91 mmHg     Pulse Rate 12/13/14 1443 78     Resp 12/13/14 1443 20     Temp 12/13/14 1443 98 F (36.7 C)     Temp Source 12/13/14 1443 Oral     SpO2 12/13/14  1443 98 %     Weight 12/13/14 1443 192 lb (87.091 kg)     Height 12/13/14 1443  (1.676 m)     Head Cir --      Peak Flow --      Pain Score 12/13/14 1443 5     Pain Loc --      Pain Edu? --      Excl. in GC? --     Constitutional: Alert and oriented. Well appearing and in no distress. Eyes: Conjunctivae are injected bilaterally PERRL. Normal extraocular movements. ENT   Head: Normocephalic and atraumatic.   Nose: No congestion/rhinnorhea.   Mouth/Throat: Mucous membranes are moist.   Neck: No  stridor. Cardiovascular: Normal rate, regular rhythm. Normal and symmetric distal pulses are present in all extremities. No murmurs, rubs, or gallops. Respiratory: Normal respiratory effort without tachypnea nor retractions. Breath sounds are clear and equal bilaterally. No wheezes/rales/rhonchi. Gastrointestinal: Soft and nontender. No distention. No abdominal bruits.  Musculoskeletal: Nontender with normal range of motion in all extremities. No joint effusions.  No lower extremity tenderness nor edema. Neurologic:  Normal speech and language. No gross focal neurologic deficits are appreciated. Speech is normal. No gait instability. Skin:  Skin is warm, dry and intact. No rash noted. Psychiatric: Mood and affect are normal. Speech and behavior are normal. Patient exhibits appropriate insight and judgment. ____________________________________________  EKG: Interpreted by me. Normal sinus rhythm with a rate of 75 bpm, normal axis normal intervals. No evidence of hypertrophy or acute infarction.  ____________________________________________  ED COURSE:  Pertinent labs & imaging results that were available during my care of the patient were reviewed by me and considered in my medical decision making (see chart for details). Patient will have cardiac labs, chest x-ray and d-dimer likely. ____________________________________________    LABS (pertinent positives/negatives)  Labs Reviewed  BASIC METABOLIC PANEL - Abnormal; Notable for the following:    BUN <5 (*)    All other components within normal limits  CBC - Abnormal; Notable for the following:    WBC 12.8 (*)    All other components within normal limits  FIBRIN DERIVATIVES D-DIMER ([facility] ONLY) - Abnormal; Notable for the following:    Fibrin derivatives D-dimer ([facility]) 658 (*)    All other components within normal limits  TROPONIN I    RADIOLOGY Images were viewed by me  Chest x-ray reveals possible lingular airspace disease CTA  of the chest reveals pneumonia and multiple locations ____________________________________________  FINAL ASSESSMENT AND PLAN  Pleurisy, community acquired pneumonia   Plan: Patient with labs and imaging as dictated above. Patient with pleuritic pain from a community-acquired pneumonia. She has received her first dose of Levaquin and she'll continue home on same with Tussionex for cough and pleuritic pain. She is stable for outpatient follow-up.   Emily Filbert, MD   Emily Filbert, MD 12/13/14 [phone]

## 2014-12-14 NOTE — Telephone Encounter (Signed)
Patient diagnosed with pleurisy in the ED. She can repeat another monitor if she wants. Two have been unrevealing thus far.

## 2014-12-15 ENCOUNTER — Other Ambulatory Visit: Payer: Self-pay

## 2014-12-15 MED ORDER — POTASSIUM CHLORIDE ER 10 MEQ PO TBCR: 10.0000 meq | EXTENDED_RELEASE_TABLET | Freq: Every day | ORAL | Status: DC

## 2014-12-18 ENCOUNTER — Other Ambulatory Visit: Payer: Self-pay

## 2014-12-18 DIAGNOSIS — E785 Hyperlipidemia, unspecified: Secondary | ICD-10-CM

## 2014-12-18 MED ORDER — ATORVASTATIN CALCIUM 10 MG PO TABS: 10.0000 mg | ORAL_TABLET | Freq: Every day | ORAL | Status: DC

## 2014-12-19 ENCOUNTER — Encounter: Admission: RE | Admit: 2014-12-19 | Payer: Medicare Other | Source: Ambulatory Visit

## 2014-12-20 ENCOUNTER — Ambulatory Visit: Payer: Medicare Other | Attending: Pain Medicine | Admitting: Pain Medicine

## 2014-12-20 ENCOUNTER — Encounter: Payer: Self-pay | Admitting: Pain Medicine

## 2014-12-20 VITALS — BP 110/73 | HR 72 | Temp 98.1°F | Resp 18 | Ht 66.0 in | Wt 192.0 lb

## 2014-12-20 DIAGNOSIS — M47814 Spondylosis without myelopathy or radiculopathy, thoracic region: Secondary | ICD-10-CM

## 2014-12-20 DIAGNOSIS — J189 Pneumonia, unspecified organism: Secondary | ICD-10-CM | POA: Diagnosis not present

## 2014-12-20 DIAGNOSIS — M48062 Spinal stenosis, lumbar region with neurogenic claudication: Secondary | ICD-10-CM

## 2014-12-20 DIAGNOSIS — M47816 Spondylosis without myelopathy or radiculopathy, lumbar region: Secondary | ICD-10-CM

## 2014-12-20 DIAGNOSIS — M546 Pain in thoracic spine: Secondary | ICD-10-CM | POA: Diagnosis present

## 2014-12-20 DIAGNOSIS — M5134 Other intervertebral disc degeneration, thoracic region: Secondary | ICD-10-CM

## 2014-12-20 DIAGNOSIS — M533 Sacrococcygeal disorders, not elsewhere classified: Secondary | ICD-10-CM

## 2014-12-20 DIAGNOSIS — M47894 Other spondylosis, thoracic region: Secondary | ICD-10-CM

## 2014-12-20 DIAGNOSIS — M5136 Other intervertebral disc degeneration, lumbar region: Secondary | ICD-10-CM

## 2014-12-20 DIAGNOSIS — G588 Other specified mononeuropathies: Secondary | ICD-10-CM

## 2014-12-20 MED ORDER — LEVOFLOXACIN IN D5W 250 MG/50ML IV SOLN
250.0000 mg | Freq: Once | INTRAVENOUS | Status: AC
  Administered 2014-12-20: 250 mg via INTRAVENOUS

## 2014-12-20 MED ORDER — TRIAMCINOLONE ACETONIDE 40 MG/ML IJ SUSP
40.0000 mg | Freq: Once | INTRAMUSCULAR | Status: AC
  Administered 2014-12-20: 12:00:00

## 2014-12-20 MED ORDER — BUPIVACAINE HCL (PF) 0.25 % IJ SOLN
30.0000 mL | Freq: Once | INTRAMUSCULAR | Status: AC
  Administered 2014-12-20: 12:00:00

## 2014-12-20 MED ORDER — MIDAZOLAM HCL 2 MG/2ML IJ SOLN
5.0000 mg | Freq: Once | INTRAMUSCULAR | Status: AC
  Administered 2014-12-20: 5 mg via INTRAVENOUS

## 2014-12-20 MED ORDER — LIDOCAINE HCL (PF) 1 % IJ SOLN: 10.0000 mL | Freq: Once | INTRAMUSCULAR | Status: DC

## 2014-12-20 MED ORDER — MIDAZOLAM HCL 5 MG/5ML IJ SOLN
INTRAMUSCULAR | Status: AC
  Administered 2014-12-20: 5 mg via INTRAVENOUS
  Filled 2014-12-20: qty 5

## 2014-12-20 MED ORDER — ORPHENADRINE CITRATE 30 MG/ML IJ SOLN
INTRAMUSCULAR | Status: AC
  Filled 2014-12-20: qty 2

## 2014-12-20 MED ORDER — LACTATED RINGERS IV SOLN: 1000.0000 mL | INTRAVENOUS | Status: DC

## 2014-12-20 MED ORDER — FENTANYL CITRATE (PF) 100 MCG/2ML IJ SOLN
100.0000 ug | Freq: Once | INTRAMUSCULAR | Status: AC
  Administered 2014-12-20: 100 ug via INTRAVENOUS

## 2014-12-20 MED ORDER — LEVOFLOXACIN IN D5W 250 MG/50ML IV SOLN
INTRAVENOUS | Status: AC
  Administered 2014-12-20: 250 mg via INTRAVENOUS
  Filled 2014-12-20: qty 50

## 2014-12-20 MED ORDER — CIPROFLOXACIN HCL 250 MG PO TABS: 250.0000 mg | ORAL_TABLET | Freq: Two times a day (BID) | ORAL | Status: DC

## 2014-12-20 MED ORDER — FENTANYL CITRATE (PF) 100 MCG/2ML IJ SOLN
INTRAMUSCULAR | Status: AC
  Administered 2014-12-20: 100 ug via INTRAVENOUS
  Filled 2014-12-20: qty 2

## 2014-12-20 MED ORDER — BUPIVACAINE HCL (PF) 0.25 % IJ SOLN
INTRAMUSCULAR | Status: AC
  Filled 2014-12-20: qty 30

## 2014-12-20 MED ORDER — TRIAMCINOLONE ACETONIDE 40 MG/ML IJ SUSP
INTRAMUSCULAR | Status: AC
  Filled 2014-12-20: qty 1

## 2014-12-20 MED ORDER — CIPROFLOXACIN IN D5W 400 MG/200ML IV SOLN: 400.0000 mg | Freq: Once | INTRAVENOUS | Status: DC

## 2014-12-20 MED ORDER — SODIUM CHLORIDE 0.9 % IJ SOLN: 20.0000 mL | Freq: Once | INTRAMUSCULAR | Status: DC

## 2014-12-20 MED ORDER — ORPHENADRINE CITRATE 30 MG/ML IJ SOLN
60.0000 mg | Freq: Once | INTRAMUSCULAR | Status: AC
  Administered 2014-12-20: 12:00:00 via INTRAMUSCULAR

## 2014-12-20 NOTE — Progress Notes (Signed)
Safety precautions to be maintained throughout the outpatient stay will include: orient to surroundings, keep bed in low position, maintain call bell within reach at all times, provide assistance with transfer out of bed and ambulation.   Pharmacy Toniann Fail) called and informed RN of possible interaction of Versed and Methadone.  Dr. Metta Clines informed. No new orders given.

## 2014-12-20 NOTE — Progress Notes (Signed)
   Subjective:    Patient ID: Cheyenne Gray, female    DOB: 01-Jan-1966, 49 y.o.   MRN: [id number]  HPI    Review of Systems     Objective:   Physical Exam        Assessment & Plan:

## 2014-12-20 NOTE — Patient Instructions (Addendum)
PLAN  Continue present medication Neurontin and methadone and antibiotic  F/U PCP for evaliation of  BP and general medical  condition.  F/U surgical evaluation. May consider pending follow-up evaluations  F/U neurological evaluation. May consider pending follow-up evaluations  May consider radiofrequency rhizolysis or intraspinal procedures pending response to present treatment and F/U evaluation.  Patient to call [facility] Management Center should patient have concerns prior to scheduled return appointment.  Pain Management Discharge Instructions  General Discharge Instructions :  If you need to reach your doctor call: Monday-Friday 8:00 am - 4:00 pm at [phone] or toll free [phone].  After clinic hours [phone] to have operator reach doctor.  Bring all of your medication bottles to all your appointments in the pain clinic.  To cancel or reschedule your appointment with Pain Management please remember to call 24 hours in advance to avoid a fee.  Refer to the educational materials which you have been given on: General Risks, I had my Procedure. Discharge Instructions, Post Sedation.  Post Procedure Instructions:  The drugs you were given will stay in your system until tomorrow, so for the next 24 hours you should not drive, make any legal decisions or drink any alcoholic beverages.  You may eat anything you prefer, but it is better to start with liquids then soups and crackers, and gradually work up to solid foods.  Please notify your doctor immediately if you have any unusual bleeding, trouble breathing or pain that is not related to your normal pain.  Depending on the type of procedure that was done, some parts of your body may feel week and/or numb.  This usually clears up by tonight or the next day.  Walk with the use of an assistive device or accompanied by an adult for the 24 hours.  You may use ice on the affected area for the first 24 hours.  Put ice in a  Ziploc bag and cover with a towel and place against area 15 minutes on 15 minutes off.  You may switch to heat after 24 hours.GENERAL RISKS AND COMPLICATIONS  What are the risk, side effects and possible complications? Generally speaking, most procedures are safe.  However, with any procedure there are risks, side effects, and the possibility of complications.  The risks and complications are dependent upon the sites that are lesioned, or the type of nerve block to be performed.  The closer the procedure is to the spine, the more serious the risks are.  Great care is taken when placing the radio frequency needles, block needles or lesioning probes, but sometimes complications can occur. 1. Infection: Any time there is an injection through the skin, there is a risk of infection.  This is why sterile conditions are used for these blocks.  There are four possible types of infection. 1. Localized skin infection. 2. Central Nervous System Infection-This can be in the form of Meningitis, which can be deadly. 3. Epidural Infections-This can be in the form of an epidural abscess, which can cause pressure inside of the spine, causing compression of the spinal cord with subsequent paralysis. This would require an emergency surgery to decompress, and there are no guarantees that the patient would recover from the paralysis. 4. Discitis-This is an infection of the intervertebral discs.  It occurs in about 1% of discography procedures.  It is difficult to treat and it may lead to surgery.        2. Pain: the needles have to go through skin and soft  tissues, will cause soreness.       3. Damage to internal structures:  The nerves to be lesioned may be near blood vessels or    other nerves which can be potentially damaged.       4. Bleeding: Bleeding is more common if the patient is taking blood thinners such as  aspirin, Coumadin, Ticiid, Plavix, etc., or if he/she have some genetic predisposition  such as  hemophilia. Bleeding into the spinal canal can cause compression of the spinal  cord with subsequent paralysis.  This would require an emergency surgery to  decompress and there are no guarantees that the patient would recover from the  paralysis.       5. Pneumothorax:  Puncturing of a lung is a possibility, every time a needle is introduced in  the area of the chest or upper back.  Pneumothorax refers to free air around the  collapsed lung(s), inside of the thoracic cavity (chest cavity).  Another two possible  complications related to a similar event would include: Hemothorax and Chylothorax.   These are variations of the Pneumothorax, where instead of air around the collapsed  lung(s), you may have blood or chyle, respectively.       6. Spinal headaches: They may occur with any procedures in the area of the spine.       7. Persistent CSF (Cerebro-Spinal Fluid) leakage: This is a rare problem, but may occur  with prolonged intrathecal or epidural catheters either due to the formation of a fistulous  track or a dural tear.       8. Nerve damage: By working so close to the spinal cord, there is always a possibility of  nerve damage, which could be as serious as a permanent spinal cord injury with  paralysis.       9. Death:  Although rare, severe deadly allergic reactions known as "Anaphylactic  reaction" can occur to any of the medications used.      10. Worsening of the symptoms:  We can always make thing worse.  What are the chances of something like this happening? Chances of any of this occuring are extremely low.  By statistics, you have more of a chance of getting killed in a motor vehicle accident: while driving to the hospital than any of the above occurring .  Nevertheless, you should be aware that they are possibilities.  In general, it is similar to taking a shower.  Everybody knows that you can slip, hit your head and get killed.  Does that mean that you should not shower again?  Nevertheless  always keep in mind that statistics do not mean anything if you happen to be on the wrong side of them.  Even if a procedure has a 1 (one) in a 1,000,000 (million) chance of going wrong, it you happen to be that one..Also, keep in mind that by statistics, you have more of a chance of having something go wrong when taking medications.  Who should not have this procedure? If you are on a blood thinning medication (e.g. Coumadin, Plavix, see list of "Blood Thinners"), or if you have an active infection going on, you should not have the procedure.  If you are taking any blood thinners, please inform your physician.  How should I prepare for this procedure?  Do not eat or drink anything at least six hours prior to the procedure.  Bring a driver with you .  It cannot be a taxi.  Come  accompanied by an adult that can drive you back, and that is strong enough to help you if your legs get weak or numb from the local anesthetic.  Take all of your medicines the morning of the procedure with just enough water to swallow them.  If you have diabetes, make sure that you are scheduled to have your procedure done first thing in the morning, whenever possible.  If you have diabetes, take only half of your insulin dose and notify our nurse that you have done so as soon as you arrive at the clinic.  If you are diabetic, but only take blood sugar pills (oral hypoglycemic), then do not take them on the morning of your procedure.  You may take them after you have had the procedure.  Do not take aspirin or any aspirin-containing medications, at least eleven (11) days prior to the procedure.  They may prolong bleeding.  Wear loose fitting clothing that may be easy to take off and that you would not mind if it got stained with Betadine or blood.  Do not wear any jewelry or perfume  Remove any nail coloring.  It will interfere with some of our monitoring equipment.  NOTE: Remember that this is not meant to be  interpreted as a complete list of all possible complications.  Unforeseen problems may occur.  BLOOD THINNERS The following drugs contain aspirin or other products, which can cause increased bleeding during surgery and should not be taken for 2 weeks prior to and 1 week after surgery.  If you should need take something for relief of minor pain, you may take acetaminophen which is found in Tylenol,m Datril, Anacin-3 and Panadol. It is not blood thinner. The products listed below are.  Do not take any of the products listed below in addition to any listed on your instruction sheet.  A.P.C or A.P.C with Codeine Codeine Phosphate Capsules #3 Ibuprofen Ridaura  ABC compound Congesprin Imuran rimadil  Advil Cope Indocin Robaxisal  Alka-Seltzer Effervescent Pain Reliever and Antacid Coricidin or Coricidin-D  Indomethacin Rufen  Alka-Seltzer plus Cold Medicine Cosprin Ketoprofen S-A-C Tablets  Anacin Analgesic Tablets or Capsules Coumadin Korlgesic Salflex  Anacin Extra Strength Analgesic tablets or capsules CP-2 Tablets Lanoril Salicylate  Anaprox Cuprimine Capsules Levenox Salocol  Anexsia-D Dalteparin Magan Salsalate  Anodynos Darvon compound Magnesium Salicylate Sine-off  Ansaid Dasin Capsules Magsal Sodium Salicylate  Anturane Depen Capsules Marnal Soma  APF Arthritis pain formula Dewitt's Pills Measurin Stanback  Argesic Dia-Gesic Meclofenamic Sulfinpyrazone  Arthritis Bayer Timed Release Aspirin Diclofenac Meclomen Sulindac  Arthritis pain formula Anacin Dicumarol Medipren Supac  Analgesic (Safety coated) Arthralgen Diffunasal Mefanamic Suprofen  Arthritis Strength Bufferin Dihydrocodeine Mepro Compound Suprol  Arthropan liquid Dopirydamole Methcarbomol with Aspirin Synalgos  ASA tablets/Enseals Disalcid Micrainin Tagament  Ascriptin Doan's Midol Talwin  Ascriptin A/D Dolene Mobidin Tanderil  Ascriptin Extra Strength Dolobid Moblgesic Ticlid  Ascriptin with Codeine Doloprin or Doloprin  with Codeine Momentum Tolectin  Asperbuf Duoprin Mono-gesic Trendar  Aspergum Duradyne Motrin or Motrin IB Triminicin  Aspirin plain, buffered or enteric coated Durasal Myochrisine Trigesic  Aspirin Suppositories Easprin Nalfon Trillsate  Aspirin with Codeine Ecotrin Regular or Extra Strength Naprosyn Uracel  Atromid-S Efficin Naproxen Ursinus  Auranofin Capsules Elmiron Neocylate Vanquish  Axotal Emagrin Norgesic Verin  Azathioprine Empirin or Empirin with Codeine Normiflo Vitamin E  Azolid Emprazil Nuprin Voltaren  Bayer Aspirin plain, buffered or children's or timed BC Tablets or powders Encaprin Orgaran Warfarin Sodium  Buff-a-Comp Enoxaparin Orudis Zorpin  Buff-a-Comp  with Codeine Equegesic Os-Cal-Gesic   Buffaprin Excedrin plain, buffered or Extra Strength Oxalid   Bufferin Arthritis Strength Feldene Oxphenbutazone   Bufferin plain or Extra Strength Feldene Capsules Oxycodone with Aspirin   Bufferin with Codeine Fenoprofen Fenoprofen Pabalate or Pabalate-SF   Buffets II Flogesic Panagesic   Buffinol plain or Extra Strength Florinal or Florinal with Codeine Panwarfarin   Buf-Tabs Flurbiprofen Penicillamine   Butalbital Compound Four-way cold tablets Penicillin   Butazolidin Fragmin Pepto-Bismol   Carbenicillin Geminisyn Percodan   Carna Arthritis Reliever Geopen Persantine   Carprofen Gold's salt Persistin   Chloramphenicol Goody's Phenylbutazone   Chloromycetin Haltrain Piroxlcam   Clmetidine heparin Plaquenil   Cllnoril Hyco-pap Ponstel   Clofibrate Hydroxy chloroquine Propoxyphen         Before stopping any of these medications, be sure to consult the physician who ordered them.  Some, such as Coumadin (Warfarin) are ordered to prevent or treat serious conditions such as "deep thrombosis", "pumonary embolisms", and other heart problems.  The amount of time that you may need off of the medication may also vary with the medication and the reason for which you were taking it.   If you are taking any of these medications, please make sure you notify your pain physician before you undergo any procedures.

## 2014-12-20 NOTE — Progress Notes (Signed)
Subjective:    Patient ID: Cheyenne Gray, female    DOB: 1965/04/14, 49 y.o.   MRN: [id number]  HPI PROCEDURE PERFORMED:  Intercostal nerve block.  HISTORY OF PRESENT ILLNESS:  The patient is a 49 y.o. female who returns to the [facility] Center for further evaluation and treatment of pain involving the midportion of the back. The patient is with diagnosis of pneumonia and has had difficulty performing deep breathing. We discussed patient's condition and will proceed with intercostal nerve block in attempt to decrease pain of the thoracic region and improve patient's ability to perform deep breathing to prevent atelectasis and progression of patient's pneumonia. There is concern regarding the patient's pain being due to significant component of intercostal neuralgia. The risks, benefits, and expectations of the procedure have been discussed and explained to the patient who was understanding and in agreement with suggested treatment plan. We will proceed with interventional treatment which is felt to be medically necessary procedure at this time s discussed.   DESCRIPTION OF PROCEDURE: Intercostal nerve block with IV Versed, IV fentanyl conscious sedation, EKG, blood pressure, pulse, and pulse oximetry monitoring. The procedure was performed with the patient in the prone position under fluoroscopic guidance.   Intercostal nerve block, Left side: With the patient in the prone position, Betadine prep of proposed entry site was performed under fluoroscopic guidance with AP view of the thoracic spine. Under fluoroscopic guidance, a 22 -gauge needle was inserted to contact bone of the 10th rib on the left side after which the needle was repositioned at the inferior border of the 10th rib on the left side under fluoroscopic guidance. Following documentation of needle placement, at the inferior border of the 10th rib on the left side and negative aspiration, a total of 3 mL of 0.25% bupivacaine with  Kenalog was injected for left side for 10th rib intercostal nerve block.   INTERCOSTAL NERVE BLOCKS AT T9, T8, T7, T6, and T5 LEVELS: The procedure was performed at these levels as was performed at the previous level, T 10, utilizing the same technique and under fluoroscopic guidance  Myoneural block injections of the thoracic region Following Betadine prep of proposed entry site a 22-gauge needle was inserted and thoracic musculature region and following negative aspiration 2 cc of 0.25% bupivacaine with Norflex was injected for myoneural block injection 2  Myoneural block injection of the gluteal musculature region  Following Betadine prep of proposed entry site a 22-gauge needle was inserted in the gluteal musculature region and following negative aspiration 2 cc of 0.25% bupivacaine with Norflex was injected for myoneural block injection 2  The patient tolerated procedure well  A total of 10 mg Kenalog was utilized for the procedure.   PLAN:   1. Medications: We will continue presently prescribed medications. Neurontin and methadone 2. The patient is to follow up with primary care physician for further evaluation of blood pressure pneumonia and general medical condition as discussed. We encouraged patient to breathe deeply to avoid atelectasis and progression of pneumonia. Patient will continue antibiotic coverage as prescribed 3. Surgical evaluation. We will consider further evaluation as discussed pending follow-up evaluation 4. Neurological evaluation. We may consider further neurological studies pending follow-up evaluation 5. May consider the patient for additional studies pending response to treatment and follow-up evaluation. 6. May consider radiofrequency procedures, implantation type procedures and other treatment pending response to treatment and follow-up evaluation. 7. The patient has been advised to adhere to proper body mechanics and to call  the Pain Management Center prior  to scheduled return appointment should there be significant change in condition or have other concerns regarding condition prior to scheduled return appointment.  The patient is understanding and in agreement with suggested treatment plan.     Review of Systems     Objective:   Physical Exam        Assessment & Plan:

## 2014-12-21 ENCOUNTER — Other Ambulatory Visit: Payer: Self-pay

## 2014-12-21 ENCOUNTER — Ambulatory Visit (INDEPENDENT_AMBULATORY_CARE_PROVIDER_SITE_OTHER): Payer: Medicare Other

## 2014-12-21 DIAGNOSIS — R0602 Shortness of breath: Secondary | ICD-10-CM | POA: Diagnosis not present

## 2014-12-21 DIAGNOSIS — R079 Chest pain, unspecified: Secondary | ICD-10-CM | POA: Diagnosis not present

## 2014-12-21 NOTE — Telephone Encounter (Signed)
Message left

## 2014-12-26 ENCOUNTER — Inpatient Hospital Stay: Payer: Medicare Other | Attending: Internal Medicine

## 2014-12-26 VITALS — BP 113/78 | HR 76 | Temp 96.6°F | Resp 18

## 2014-12-26 DIAGNOSIS — Z79899 Other long term (current) drug therapy: Secondary | ICD-10-CM | POA: Insufficient documentation

## 2014-12-26 DIAGNOSIS — D509 Iron deficiency anemia, unspecified: Secondary | ICD-10-CM | POA: Diagnosis not present

## 2014-12-26 MED ORDER — SODIUM CHLORIDE 0.9 % IV SOLN
INTRAVENOUS | Status: DC
  Administered 2014-12-26: 12:00:00 via INTRAVENOUS
  Filled 2014-12-26: qty 1000

## 2014-12-26 MED ORDER — SODIUM CHLORIDE 0.9 % IV SOLN
100.0000 mg | Freq: Once | INTRAVENOUS | Status: AC
  Administered 2014-12-26: 100 mg via INTRAVENOUS
  Filled 2014-12-26: qty 5

## 2014-12-29 ENCOUNTER — Telehealth: Payer: Self-pay

## 2014-12-29 NOTE — Telephone Encounter (Signed)
Left detailed message regarding 10/17 myoview on pt VM. Reviewed instructions Provided CB number if any questions.

## 2015-01-01 ENCOUNTER — Encounter: Admission: RE | Admit: 2015-01-01 | Payer: Medicare Other | Source: Ambulatory Visit

## 2015-01-01 ENCOUNTER — Telehealth: Payer: Self-pay | Admitting: *Deleted

## 2015-01-01 NOTE — Telephone Encounter (Signed)
S/w pt who states she thought stress test was Oct 21. States she did not listen to VM left from us on Friday regarding date, time, instructions. Asks to reschedule 10/21. Scheduled 7:30am, arrival 7am. Reviewed instructions.  Pt agreeable w/plan States she received letter from us regarding results Reviewed echo results, pt verbalized understanding. Pt had no further questions.

## 2015-01-01 NOTE — Telephone Encounter (Signed)
Pt said she got the dates wrong for stress test this morning.  She said she is very sorry, that she though it was for Friday.  But when we call her back she would not mind to rsch that. Please call.

## 2015-01-05 ENCOUNTER — Telehealth: Payer: Self-pay

## 2015-01-05 ENCOUNTER — Telehealth: Payer: Self-pay | Admitting: *Deleted

## 2015-01-05 ENCOUNTER — Encounter: Admission: RE | Admit: 2015-01-05 | Payer: Medicare Other | Source: Ambulatory Visit

## 2015-01-05 NOTE — Telephone Encounter (Signed)
Pt needs to discuss r/s her stress test. Please call.

## 2015-01-05 NOTE — Telephone Encounter (Signed)
[facility] calling stating pt did not show for their test this morning. Just letting us know.

## 2015-01-05 NOTE — Telephone Encounter (Signed)
Per Alycia Rossettiyan, we can not reschedule myoview until further discussion w/Dr. Mariah MillingGollan. Pt has missed 3 myoview appointments.  Left message on machine for patient to contact the office.

## 2015-01-05 NOTE — Telephone Encounter (Signed)
Left message on machine for patient to contact the office.    Pt did not show up for stress test. This is the third time it has been rescheduled. Pt has f/u appt 11/3 Forward to [facility] to make aware.

## 2015-01-06 ENCOUNTER — Ambulatory Visit
Admission: RE | Admit: 2015-01-06 | Discharge: 2015-01-06 | Disposition: A | Discharge status: Home / Self Care | Payer: Medicare Other | Source: Ambulatory Visit | Attending: Family Medicine | Admitting: Family Medicine

## 2015-01-06 ENCOUNTER — Other Ambulatory Visit: Payer: Self-pay | Admitting: Family Medicine

## 2015-01-06 DIAGNOSIS — J189 Pneumonia, unspecified organism: Secondary | ICD-10-CM | POA: Insufficient documentation

## 2015-01-08 ENCOUNTER — Emergency Department
Admission: EM | Admit: 2015-01-08 | Discharge: 2015-01-08 | Disposition: A | Discharge status: Home / Self Care | Payer: Medicare Other | Attending: Emergency Medicine | Admitting: Emergency Medicine

## 2015-01-08 ENCOUNTER — Encounter: Payer: Self-pay | Admitting: Emergency Medicine

## 2015-01-08 ENCOUNTER — Emergency Department: Payer: Medicare Other

## 2015-01-08 DIAGNOSIS — Z792 Long term (current) use of antibiotics: Secondary | ICD-10-CM | POA: Insufficient documentation

## 2015-01-08 DIAGNOSIS — R42 Dizziness and giddiness: Secondary | ICD-10-CM | POA: Diagnosis not present

## 2015-01-08 DIAGNOSIS — Z79899 Other long term (current) drug therapy: Secondary | ICD-10-CM | POA: Diagnosis not present

## 2015-01-08 DIAGNOSIS — Z880 Allergy status to penicillin: Secondary | ICD-10-CM | POA: Diagnosis not present

## 2015-01-08 DIAGNOSIS — R461 Bizarre personal appearance: Secondary | ICD-10-CM | POA: Diagnosis not present

## 2015-01-08 DIAGNOSIS — J45901 Unspecified asthma with (acute) exacerbation: Secondary | ICD-10-CM | POA: Diagnosis not present

## 2015-01-08 DIAGNOSIS — R918 Other nonspecific abnormal finding of lung field: Secondary | ICD-10-CM | POA: Diagnosis not present

## 2015-01-08 DIAGNOSIS — R0602 Shortness of breath: Secondary | ICD-10-CM | POA: Diagnosis present

## 2015-01-08 DIAGNOSIS — R079 Chest pain, unspecified: Secondary | ICD-10-CM | POA: Diagnosis not present

## 2015-01-08 LAB — BASIC METABOLIC PANEL
ANION GAP: 9 (ref 5–15)
BUN: 11 mg/dL (ref 6–20)
CALCIUM: 9.5 mg/dL (ref 8.9–10.3)
CO2: 25 mmol/L (ref 22–32)
Chloride: 104 mmol/L (ref 101–111)
Creatinine, Ser: 0.78 mg/dL (ref 0.44–1.00)
Glucose, Bld: 92 mg/dL (ref 65–99)
POTASSIUM: 3.8 mmol/L (ref 3.5–5.1)
SODIUM: 138 mmol/L (ref 135–145)

## 2015-01-08 LAB — CBC
HCT: 43 % (ref 35.0–47.0)
HEMOGLOBIN: 14.6 g/dL (ref 12.0–16.0)
MCH: 31.3 pg (ref 26.0–34.0)
MCHC: 33.9 g/dL (ref 32.0–36.0)
MCV: 92.3 fL (ref 80.0–100.0)
PLATELETS: 217 10*3/uL (ref 150–440)
RBC: 4.65 MIL/uL (ref 3.80–5.20)
RDW: 13.5 % (ref 11.5–14.5)
WBC: 7.3 10*3/uL (ref 3.6–11.0)

## 2015-01-08 LAB — TROPONIN I

## 2015-01-08 MED ORDER — HYDROMORPHONE HCL 1 MG/ML IJ SOLN: 1.0000 mg | Freq: Once | INTRAMUSCULAR | Status: DC

## 2015-01-08 MED ORDER — LEVOFLOXACIN 750 MG PO TABS: 750.0000 mg | ORAL_TABLET | Freq: Every day | ORAL | Status: AC

## 2015-01-08 MED ORDER — LORAZEPAM 2 MG/ML IJ SOLN
1.0000 mg | Freq: Once | INTRAMUSCULAR | Status: AC
  Administered 2015-01-08: 1 mg via INTRAVENOUS
  Filled 2015-01-08: qty 1

## 2015-01-08 NOTE — ED Notes (Signed)
Pt given Incentive Spirometer and instructed on use, pt demonstrated correct form

## 2015-01-08 NOTE — Discharge Instructions (Signed)
For your pain, you may take Tylenol or Motrin.  Please make a follow-up appointment with your regular doctor.  Today her chest x-ray shows either a small fluid collection or possibly a small asked. We will start you back on antibiotics to clear this if it is infection. However, your primary care physician will need to follow-up. Imaging to make sure that it completely resolves once her symptoms have resolved.  Please return to the emergency department if he developed chest pain, shortness of breath, fainting, palpitations, or any other symptoms concerning to you.

## 2015-01-08 NOTE — ED Notes (Signed)
Pt presents with shortness of breath since Saturday, recently dx with pne and sent home on antiobiotics, finished meds and not feeling any better. Pt was seen by pcp and xray was completed this past Saturday, pcp has not resulted xray as of today. Pt noted with some shortness of breath but is speaking in complete sentences and o2 is 100%.

## 2015-01-08 NOTE — Telephone Encounter (Signed)
Left message on machine for patient to contact the office.   

## 2015-01-08 NOTE — ED Notes (Signed)
Pt was recently diagnoised with Pneumonia, pt states she finished her abx course and states since last night increased SOB, pt speaking in full sentances but breathing shallow, MD at bedside

## 2015-01-08 NOTE — ED Provider Notes (Signed)
[facility] Emergency Department Provider Note  ____________________________________________  Time seen: Approximately 2:53 PM  I have reviewed the triage vital signs and the nursing notes.   HISTORY  Chief Complaint Shortness of Breath    HPI Cheyenne Gray is a 49 y.o. female recently diagnosed with community-acquired pneumonia on 12/13/2014 presenting with continued chest pain and shortness of breath. The patient was seen here by Dr. Mayford Knife and had a CT of the chest that showed multiple areas of pneumonia in the right lung. She reports that since that time she has had a persistent left-sided chest pain that is mostly under the breast. It is worse with deep breaths so she does not take deep breaths. She completed the entire course of antibiotics and has not improved. She feels short of breath and lightheaded with standing. She denies any syncope or palpitations. She denies any calf pain.  She does not have cough or fever. She has been seen by an outpatient PCP with an x-ray completed 2 days ago for which she does not have the results.   Past Medical History  Diagnosis Date  . Chronic pain     a. on methadone  . DJD (degenerative joint disease)   . Iron deficiency anemia   . Long QT interval   . Syncope and collapse   . Iron deficiency   . History of echocardiogram     a. echo 2014: EF 65-70%, nl WM, mildly dilated LA, PASP nl  . Arthritis   . Asthma   . Obesity   . Migraine   . Psoriasis   . Chronic back pain   . DJD (degenerative joint disease), multiple sites   . B12 deficiency   . Vitamin D deficiency   . Depression   . History of shingles   . Palpitations     a. 24 hour Holter: NSR, sinus brady down to 48, occasional PVCs & couplets, 8 beats NSVT; b. 30 day event monitor 2015: NSR with rare PVC  . Hand, foot and mouth disease 2016    Patient Active Problem List   Diagnosis Date Noted  . IDA (iron deficiency anemia) 09/19/2014  . DDD  (degenerative disc disease), thoracic 09/14/2014  . DDD (degenerative disc disease), lumbar 09/14/2014  . Sacroiliac joint dysfunction 09/14/2014  . Thoracic facet syndrome 09/14/2014  . Facet syndrome, lumbar 09/14/2014  . Hyperlipidemia 01/25/2014  . PVC (premature ventricular contraction) 01/10/2014  . Fatigue 05/25/2013  . CAD (coronary artery disease) 04/22/2013  . PVD (peripheral vascular disease) (HCC) 04/22/2013  . Long Q-T syndrome 07/09/2012  . Chest pressure 07/09/2012  . NSVT (nonsustained ventricular tachycardia) (HCC) 07/09/2012  . Syncope 07/09/2012  . MUSCLE STRAIN 07/20/2009    Past Surgical History  Procedure Laterality Date  . Bariatric surgery  2001  . Cholecystectomy  2001  . Gastroplasty    . Gastric bypass    . Gallbladder surgery      Current Outpatient Rx  Name  Route  Sig  Dispense  Refill  . albuterol (PROVENTIL HFA;VENTOLIN HFA) 108 (90 BASE) MCG/ACT inhaler   Inhalation   Inhale 2 puffs into the lungs as needed for wheezing.         Marland Kitchen atorvastatin (LIPITOR) 10 MG tablet   Oral   Take 1 tablet (10 mg total) by mouth daily.   30 tablet   3   . chlorpheniramine-HYDROcodone (TUSSIONEX PENNKINETIC ER) 10-8 MG/5ML SUER   Oral   Take 5 mLs by mouth  2 (two) times daily.   140 mL   0   . cyclobenzaprine (FLEXERIL) 10 MG tablet   Oral   Take 10 mg by mouth at bedtime as needed for muscle spasms.         Marland Kitchen gabapentin (NEURONTIN) 400 MG capsule      Limit 2 tablets in the a.m. and midday and 3 tablets each evening   210 capsule   0   . levofloxacin (LEVAQUIN) 750 MG tablet   Oral   Take 1 tablet (750 mg total) by mouth daily.   7 tablet   0   . methadone (DOLOPHINE) 10 MG tablet      Limit 1-2 tablets by mouth 2-3 times per day if tolerated   180 tablet   0   . potassium chloride (K-DUR) 10 MEQ tablet   Oral   Take 1 tablet (10 mEq total) by mouth daily.   30 tablet   0   . SUMAtriptan (IMITREX) 25 MG tablet   Oral   Take  25 mg by mouth every 2 (two) hours as needed for migraine. May repeat in 2 hours if headache persists or recurs.         Marland Kitchen trimethoprim-polymyxin b (POLYTRIM) ophthalmic solution   Both Eyes   Place 2 drops into both eyes every 4 (four) hours.   10 mL   0     Allergies Penicillins  Family History  Problem Relation Age of Onset  . Heart attack Mother   . Cancer Mother   . Heart attack Father 80    MI  . Heart attack Brother   . Heart disease Brother   . Heart attack Maternal Grandmother   . Cancer Maternal Grandmother   . Cancer Maternal Uncle     Social History Social History  Substance Use Topics  . Smoking status: Never Smoker   . Smokeless tobacco: None  . Alcohol Use: No    Review of Systems Constitutional: No fever/chills. Positive lightheadedness. Negative syncope. Eyes: No visual changes. ENT: No sore throat. Cardiovascular: Positive left-sided chest pain, no palpitations. Respiratory: Positive shortness of breath.  No cough. Gastrointestinal: No abdominal pain.  No nausea, no vomiting.  No diarrhea.  No constipation. Genitourinary: Negative for dysuria. Musculoskeletal: Negative for back pain. Skin: Negative for rash. Neurological: Negative for headaches, focal weakness or numbness.  10-point ROS otherwise negative.  ____________________________________________   PHYSICAL EXAM:  VITAL SIGNS: ED Triage Vitals  Enc Vitals Group     BP 01/08/15 1156 136/91 mmHg     Pulse Rate 01/08/15 1156 81     Resp 01/08/15 1156 20     Temp 01/08/15 1156 98 F (36.7 C)     Temp Source 01/08/15 1156 Oral     SpO2 01/08/15 1156 100 %     Weight 01/08/15 1156 194 lb (87.998 kg)     Height 01/08/15 1156  (1.651 m)     Head Cir --      Peak Flow --      Pain Score --      Pain Loc --      Pain Edu? --      Excl. in GC? --     Constitutional: Patient is alert and oriented 3. She is able to answer questions appropriately. She is gripping both handrails  bilaterally and refuses to attempt deep breaths. She is guppy breathing.  Eyes: Conjunctivae are normal.  EOMI. Head: Atraumatic. Nose: No congestion/rhinnorhea. Mouth/Throat:  Mucous membranes are moist.  Neck: No stridor.  Supple.  Trachea is midline. No JVD. Cardiovascular: Normal rate, regular rhythm. No murmurs, rubs or gallops.  Respiratory: Patient has an abnormal respiratory effort. She is only taking shallow breaths and refuses to attempt to deeper breaths. She does not have accessory muscle use or retractions.  No retractions. Lungs CTAB.  No wheezes, rales or ronchi. Gastrointestinal: Soft and nontender. No distention. No peritoneal signs. Musculoskeletal: No LE edema. No calf tenderness or palpable cords. Negative for Homans sign. Neurologic:  Normal speech and language. No gross focal neurologic deficits are appreciated.  Skin:  Skin is warm, dry and intact. No rash noted. Psychiatric: Bizarre affect are normal. Speech and behavior are normal.  Normal judgement.  ____________________________________________   LABS (all labs ordered are listed, but only abnormal results are displayed)  Labs Reviewed  BLOOD GAS, VENOUS - Abnormal; Notable for the following:    Bicarbonate 30.8 (*)    Acid-Base Excess 4.4 (*)    All other components within normal limits  BASIC METABOLIC PANEL  TROPONIN I  CBC   ____________________________________________  EKG  ED ECG REPORT I, Rockne MenghiniNorman, Anne-Caroline, the attending physician, personally viewed and interpreted this ECG.   Date: 01/08/2015  EKG Time: 1148  Rate: 74  Rhythm: normal sinus rhythm  Axis: Normal  Intervals:none  ST&T Change: No ST elevation, no reciprocal changes and no ischemic changes.  ____________________________________________  RADIOLOGY  No results found.  ____________________________________________   PROCEDURES  Procedure(s) performed: None  Critical Care performed:  No ____________________________________________   INITIAL IMPRESSION / ASSESSMENT AND PLAN / ED COURSE  Pertinent labs & imaging results that were available during my care of the patient were reviewed by me and considered in my medical decision making (see chart for details).  49 y.o. female diagnosed with pneumonia almost one month ago who completed her entire antibiotics course. She reports that she is having continued chest pain and shortness of breath. On my exam her objective data is reassuring. She has a 100% oxygenation without supplemental oxygen. Her lung exam is completely clear. She does have an increased respiratory rate on my exam she is taking shallow breaths, but her initial respiratory rate in triage is normal.  My plan in the emergency department OB to reevaluate her for continued pneumonia.  She is a young healthy person who is perk negative and has no evidence of DVT, no risks factors, so will not re-CT her for evaluation of PE as this is very unlikely. I do not want to expose her breasts to another radiation course.  We will attempt incentive spirometry to see if we can change her breathing pattern. I will also get a VBG to confirm that she is oxygenating properly.  ----------------------------------------- 3:00 PM on 01/08/2015 -----------------------------------------  The patient's labs are reassuring with normal white blood cell count, normal electrolytes and a negative troponin.  ----------------------------------------- 3:36 PM on 01/08/2015 -----------------------------------------  The patient has a normal VBG. I am awaiting her chest x-ray, for final disposition.  ----------------------------------------- 3:54 PM on 01/08/2015 -----------------------------------------  The patient has small infiltrate at the lingula that is unchanged from 2 days ago. I will plan to give her new course of antibiotics, and also make sure she follows up with her primary care  physician in the case that this may no longer be infectious in etiology.  ____________________________________________  FINAL CLINICAL IMPRESSION(S) / ED DIAGNOSES  Final diagnoses:  Chest pain, unspecified chest pain type  Shortness of  breath  Lightheadedness  Pulmonary infiltrate in left lung on chest x-ray      NEW MEDICATIONS STARTED DURING THIS VISIT:  Discharge Medication List as of 01/08/2015  3:57 PM    START taking these medications   Details  levofloxacin (LEVAQUIN) 750 MG tablet Take 1 tablet (750 mg total) by mouth daily., Starting 01/08/2015, Until Mon 01/15/15, Print         Rockne Menghini, MD 01/10/15 [phone]

## 2015-01-09 ENCOUNTER — Other Ambulatory Visit: Payer: Self-pay | Admitting: Pain Medicine

## 2015-01-09 LAB — BLOOD GAS, VENOUS
ACID-BASE EXCESS: 4.4 mmol/L — AB (ref 0.0–3.0)
BICARBONATE: 30.8 meq/L — AB (ref 21.0–28.0)
PATIENT TEMPERATURE: 37
PCO2 VEN: 52 mmHg (ref 44.0–60.0)
PH VEN: 7.38 (ref 7.320–7.430)

## 2015-01-09 NOTE — Telephone Encounter (Signed)
meds ran out on 20th / appt ;not until 11-3 / she has pneumonia / can she have someone pick up script for one month so she can get better ?

## 2015-01-09 NOTE — Telephone Encounter (Signed)
[facility] tomorrow for medication refill per Dr Metta Clinesrisp.

## 2015-01-09 NOTE — Telephone Encounter (Signed)
S/w pt who states she was in ER yesterday for PNA.  Reviewed Ryan's instructions and informed pt that she should f/u w/Dr. Mariah MillingGollan 02/28/15 to further discuss lexi myoview. Does not need f/u w/Ryan Dunn Pt missed 3 lexi appts.  Pt verbalized understanding.

## 2015-01-10 ENCOUNTER — Ambulatory Visit: Payer: Medicare Other | Attending: Pain Medicine | Admitting: Pain Medicine

## 2015-01-10 ENCOUNTER — Encounter: Payer: Self-pay | Admitting: Pain Medicine

## 2015-01-10 VITALS — BP 122/79 | HR 96 | Temp 97.8°F | Resp 18 | Ht 66.0 in | Wt 190.0 lb

## 2015-01-10 DIAGNOSIS — M62838 Other muscle spasm: Secondary | ICD-10-CM | POA: Diagnosis not present

## 2015-01-10 DIAGNOSIS — J189 Pneumonia, unspecified organism: Secondary | ICD-10-CM | POA: Insufficient documentation

## 2015-01-10 DIAGNOSIS — M5126 Other intervertebral disc displacement, lumbar region: Secondary | ICD-10-CM | POA: Insufficient documentation

## 2015-01-10 DIAGNOSIS — M545 Low back pain: Secondary | ICD-10-CM | POA: Insufficient documentation

## 2015-01-10 DIAGNOSIS — R0781 Pleurodynia: Secondary | ICD-10-CM | POA: Insufficient documentation

## 2015-01-10 DIAGNOSIS — M5416 Radiculopathy, lumbar region: Secondary | ICD-10-CM | POA: Insufficient documentation

## 2015-01-10 DIAGNOSIS — M79606 Pain in leg, unspecified: Secondary | ICD-10-CM | POA: Diagnosis not present

## 2015-01-10 DIAGNOSIS — M47816 Spondylosis without myelopathy or radiculopathy, lumbar region: Secondary | ICD-10-CM

## 2015-01-10 DIAGNOSIS — M4806 Spinal stenosis, lumbar region: Secondary | ICD-10-CM | POA: Diagnosis not present

## 2015-01-10 DIAGNOSIS — M5136 Other intervertebral disc degeneration, lumbar region: Secondary | ICD-10-CM | POA: Diagnosis not present

## 2015-01-10 DIAGNOSIS — M792 Neuralgia and neuritis, unspecified: Secondary | ICD-10-CM | POA: Diagnosis not present

## 2015-01-10 DIAGNOSIS — M5134 Other intervertebral disc degeneration, thoracic region: Secondary | ICD-10-CM

## 2015-01-10 DIAGNOSIS — M47814 Spondylosis without myelopathy or radiculopathy, thoracic region: Secondary | ICD-10-CM

## 2015-01-10 DIAGNOSIS — M533 Sacrococcygeal disorders, not elsewhere classified: Secondary | ICD-10-CM

## 2015-01-10 DIAGNOSIS — M48062 Spinal stenosis, lumbar region with neurogenic claudication: Secondary | ICD-10-CM

## 2015-01-10 DIAGNOSIS — G588 Other specified mononeuropathies: Secondary | ICD-10-CM

## 2015-01-10 DIAGNOSIS — M47894 Other spondylosis, thoracic region: Secondary | ICD-10-CM

## 2015-01-10 MED ORDER — METHADONE HCL 10 MG PO TABS: ORAL_TABLET | ORAL | Status: DC

## 2015-01-10 MED ORDER — GABAPENTIN 400 MG PO CAPS: ORAL_CAPSULE | ORAL | Status: DC

## 2015-01-10 NOTE — Progress Notes (Signed)
Subjective:    Patient ID: Cheyenne Gray, female    DOB: 16-Feb-1966, 49 y.o.   MRN: [id number]  HPI Patient is 49 year old female [facility] and treatment of pain involving the region of the upper mid and lower back and lower extremity regions. The patient states that she has diagnosis of pneumonia and has had difficulty breathing deeply. Patient has significant pain involving the thoracic region. Patient is with history of lumbar lower extremity pain and significant spasms of the lumbar region and the thoracic region. We discussed patient's condition. Patient states that she has had difficulty breathing deeply. We discussed the development of small airway closure, atelectasis, due to failure to breathe deeply and the development of further complications as a result of atelectasis. We will proceed with intercostal nerve blocks at time return appointment in attempt to decrease severity of symptoms, prevent progression of patient's symptoms, and avoid need for more involved treatment. The patient was with understanding and in agreement status treatment plan the patient will undergo follow-up evaluation with Dr. Letta PateAycock for further evaluation and treatment of pneumonia as patient presently is doing. We will proceed with intercostal nerve block at time return appointment as planned      Review of Systems     Objective:   Physical Exam  There was tenderness of the splenius capitis and occipitalis musculature region palpation of these regions reproduced pain of mild to moderate discomfort. There was mild tinnitus of the acromioclavicular and glenohumeral joint regions. Patient appeared to be with bilaterally equal grip strength Tinel and Phalen's maneuver were without increased pain of any significant degree. Palpation over the thoracic region thoracic facet region was with moderate to moderately severe muscle spasms. Palpation of the thoracic paraspinal  musculature region reproduced severe pain. Patient had difficulty breathing deeply due to the pain of the thoracic region. There was tenderness over the lumbar paraspinal musculature region lumbar facet region of moderately severe degree. Lateral bending and rotation extension and palpation of the lumbar facets reproduce moderately severe discomfort. There was tenderness of the PSIS and PII S regions as well as the gluteal and piriformis musculature regions. Straight leg raising was tolerates approximately 20 without increased pain with dorsiflexion noted. There was negative clonus negative Homans. No definite sensory deficit of dermatomal distribution detected. Mild tenderness of the greater trochanteric region and iliotibial band region. Abdomen was nontender with no costovertebral angle tenderness noted.      Assessment & Plan:    Intercostal neuralgia with severe muscle spasms  Pneumonia with pleuritic pain  Degenerative disc disease lumbar spine L3 L4 multifactorial moderate to marked spinal stenosis.  L4-L5 bulge with superimposed central disc protrusion slightly greater to the left. Indentation ventral aspect of the thecal sac slightly greater to the left. Facet degenerative changes. L5-S1 mild bulge with osteophyte with foraminal/lateral extension slightly greater on the left. Mild encroachment upon but not compression of the exiting L5 nerve roots. Facet joint degenerative changes.  Lumbar stenosis with neurogenic claudication  Lumbar radiculopathy  Lumbar facet syndrome  Degenerative disc disease thoracic spine    PLAN   Continue present medication Neurontin and methadone  Intercostal nerve blocks to be performed at time return appointment  F/U PCP Dr Letta PateAycock for evaliation of  BP pneumonia and general medical  condition  F/U surgical evaluation. May consider pending follow-up evaluations  F/U neurological evaluation. May consider pending follow-up evaluations  May  consider radiofrequency rhizolysis or intraspinal procedures pending response  to present treatment and F/U evaluation   [facility] to call [facility] should patient have concerns prior to scheduled return appointment.

## 2015-01-10 NOTE — Patient Instructions (Addendum)
PLAN   Continue present medication Neurontin and methadon  Intercostal nerve block to be performed at time return appointment  F/U PCP Dr.Aycock   for evaliation of  BP and general medical  condition  F/U surgical evaluation. May consider pending follow-up evaluations  F/U neurological evaluation. May consider pending follow-up evaluations  May consider radiofrequency rhizolysis or intraspinal procedures pending response to present treatment and F/U evaluation Selective Nerve Root Block Patient Information  Description: Specific nerve roots exit the spinal canal and these nerves can be compressed and inflamed by a bulging disc and bone spurs.  By injecting steroids on the nerve root, we can potentially decrease the inflammation surrounding these nerves, which often leads to decreased pain.  Also, by injecting local anesthesia on the nerve root, this can provide [place] helpful information to give to your referring doctor if it decreases your pain.  Selective nerve root blocks can be done along the spine from the neck to the low back depending on the location of your pain.   After numbing the skin with local anesthesia, a small needle is passed to the nerve root and the position of the needle is verified using x-ray pictures.  After the needle is in correct position, we then deposit the medication.  You may experience a pressure sensation while this is being done.  The entire block usually lasts less than 15 minutes.  Conditions that may be treated with selective nerve root blocks:  Low back and leg pain  Spinal stenosis  Diagnostic block prior to potential surgery  Neck and arm pain  Post laminectomy syndrome  Preparation for the injection:  1. Do not eat any solid food or dairy products within 6 hours of your appointment. 2. You may drink clear liquids up to 2 hours before an appointment.  Clear liquids include water, black coffee, juice or soda.  No milk or cream please. 3. You may  take your regular medications, including pain medications, with a sip of water before your appointment.  Diabetics should hold regular insulin (if taken separately) and take 1/2 normal NPH dose the morning of the procedure.  Carry some sugar containing items with you to your appointment. 4. A driver must accompany you and be prepared to drive you home after your procedure. 5. Bring all your current medications with you. 6. An IV may be inserted and sedation may be given at the discretion of the physician. 7. A blood pressure cuff, EKG, and other monitors will often be applied during the procedure.  Some patients may need to have extra oxygen administered for a short period. 8. You will be asked to provide medical information, including allergies, prior to the procedure.  We must know immediately if you are taking blood  Thinners (like Coumadin) or if you are allergic to IV iodine contrast (dye).  Possible side-effects: All are usually temporary  Bleeding from needle site  Light headedness  Numbness and tingling  Decreased blood pressure  Weakness in arms/legs  Pressure sensation in back/neck  Pain at injection site (several days)  Possible complications: All are extremely rare  Infection  Nerve injury  Spinal headache (a headache wore with upright position)  Call if you experience:  Fever/chills associated with headache or increased back/neck pain  Headache worsened by an upright position  New onset weakness or numbness of an extremity below the injection site  Hives or difficulty breathing (go to the emergency room)  Inflammation or drainage at the injection site(s)  Severe  back/neck pain greater than usual  New symptoms which are concerning to you  Please note:  Although the local anesthetic injected can often make your back or neck feel good for several hours after the injection the pain will likely return.  It takes 3-5 days for steroids to work on the nerve  root. You may not notice any pain relief for at least one week.  If effective, we will often do a series of 3 injections spaced 3-6 weeks apart to maximally decrease your pain.    If you have any questions, please call [phone] [facility] Clinic[facility] RISKS AND COMPLICATIONS  What are the risk, side effects and possible complications? Generally speaking, most procedures are safe.  However, with any procedure there are risks, side effects, and the possibility of complications.  The risks and complications are dependent upon the sites that are lesioned, or the type of nerve block to be performed.  The closer the procedure is to the spine, the more serious the risks are.  Great care is taken when placing the radio frequency needles, block needles or lesioning probes, but sometimes complications can occur. 1. Infection: Any time there is an injection through the skin, there is a risk of infection.  This is why sterile conditions are used for these blocks.  There are four possible types of infection. 1. Localized skin infection. 2. Central Nervous System Infection-This can be in the form of Meningitis, which can be deadly. 3. Epidural Infections-This can be in the form of an epidural abscess, which can cause pressure inside of the spine, causing compression of the spinal cord with subsequent paralysis. This would require an emergency surgery to decompress, and there are no guarantees that the patient would recover from the paralysis. 4. Discitis-This is an infection of the intervertebral discs.  It occurs in about 1% of discography procedures.  It is difficult to treat and it may lead to surgery.        2. Pain: the needles have to go through skin and soft tissues, will cause soreness.       3. Damage to internal structures:  The nerves to be lesioned may be near blood vessels or    other nerves which can be potentially damaged.       4. Bleeding: Bleeding is more common  if the patient is taking blood thinners such as  aspirin, Coumadin, Ticiid, Plavix, etc., or if he/she have some genetic predisposition  such as hemophilia. Bleeding into the spinal canal can cause compression of the spinal  cord with subsequent paralysis.  This would require an emergency surgery to  decompress and there are no guarantees that the patient would recover from the  paralysis.       5. Pneumothorax:  Puncturing of a lung is a possibility, every time a needle is introduced in  the area of the chest or upper back.  Pneumothorax refers to free air around the  collapsed lung(s), inside of the thoracic cavity (chest cavity).  Another two possible  complications related to a similar event would include: Hemothorax and Chylothorax.   These are variations of the Pneumothorax, where instead of air around the collapsed  lung(s), you may have blood or chyle, respectively.       6. Spinal headaches: They may occur with any procedures in the area of the spine.       7. Persistent CSF (Cerebro-Spinal Fluid) leakage: This is a rare problem, but may occur  with prolonged intrathecal or epidural catheters either due to the formation of a fistulous  track or a dural tear.       8. Nerve damage: By working so close to the spinal cord, there is always a possibility of  nerve damage, which could be as serious as a permanent spinal cord injury with  paralysis.       9. Death:  Although rare, severe deadly allergic reactions known as "Anaphylactic  reaction" can occur to any of the medications used.      10. Worsening of the symptoms:  We can always make thing worse.  What are the chances of something like this happening? Chances of any of this occuring are extremely low.  By statistics, you have more of a chance of getting killed in a motor vehicle accident: while driving to the hospital than any of the above occurring .  Nevertheless, you should be aware that they are possibilities.  In general, it is similar to  taking a shower.  Everybody knows that you can slip, hit your head and get killed.  Does that mean that you should not shower again?  Nevertheless always keep in mind that statistics do not mean anything if you happen to be on the wrong side of them.  Even if a procedure has a 1 (one) in a 1,000,000 (million) chance of going wrong, it you happen to be that one..Also, keep in mind that by statistics, you have more of a chance of having something go wrong when taking medications.  Who should not have this procedure? If you are on a blood thinning medication (e.g. Coumadin, Plavix, see list of "Blood Thinners"), or if you have an active infection going on, you should not have the procedure.  If you are taking any blood thinners, please inform your physician.  How should I prepare for this procedure?  Do not eat or drink anything at least six hours prior to the procedure.  Bring a driver with you .  It cannot be a taxi.  Come accompanied by an adult that can drive you back, and that is strong enough to help you if your legs get weak or numb from the local anesthetic.  Take all of your medicines the morning of the procedure with just enough water to swallow them.  If you have diabetes, make sure that you are scheduled to have your procedure done first thing in the morning, whenever possible.  If you have diabetes, take only half of your insulin dose and notify our nurse that you have done so as soon as you arrive at the clinic.  If you are diabetic, but only take blood sugar pills (oral hypoglycemic), then do not take them on the morning of your procedure.  You may take them after you have had the procedure.  Do not take aspirin or any aspirin-containing medications, at least eleven (11) days prior to the procedure.  They may prolong bleeding.  Wear loose fitting clothing that may be easy to take off and that you would not mind if it got stained with Betadine or blood.  Do not wear any jewelry or  perfume  Remove any nail coloring.  It will interfere with some of our monitoring equipment.  NOTE: Remember that this is not meant to be interpreted as a complete list of all possible complications.  Unforeseen problems may occur.  BLOOD THINNERS The following drugs contain aspirin or other products, which can cause increased bleeding during surgery  and should not be taken for 2 weeks prior to and 1 week after surgery.  If you should need take something for relief of minor pain, you may take acetaminophen which is found in Tylenol,m Datril, Anacin-3 and Panadol. It is not blood thinner. The products listed below are.  Do not take any of the products listed below in addition to any listed on your instruction sheet.  A.P.C or A.P.C with Codeine Codeine Phosphate Capsules #3 Ibuprofen Ridaura  ABC compound Congesprin Imuran rimadil  Advil Cope Indocin Robaxisal  Alka-Seltzer Effervescent Pain Reliever and Antacid Coricidin or Coricidin-D  Indomethacin Rufen  Alka-Seltzer plus Cold Medicine Cosprin Ketoprofen S-A-C Tablets  Anacin Analgesic Tablets or Capsules Coumadin Korlgesic Salflex  Anacin Extra Strength Analgesic tablets or capsules CP-2 Tablets Lanoril Salicylate  Anaprox Cuprimine Capsules Levenox Salocol  Anexsia-D Dalteparin Magan Salsalate  Anodynos Darvon compound Magnesium Salicylate Sine-off  Ansaid Dasin Capsules Magsal Sodium Salicylate  Anturane Depen Capsules Marnal Soma  APF Arthritis pain formula Dewitt's Pills Measurin Stanback  Argesic Dia-Gesic Meclofenamic Sulfinpyrazone  Arthritis Bayer Timed Release Aspirin Diclofenac Meclomen Sulindac  Arthritis pain formula Anacin Dicumarol Medipren Supac  Analgesic (Safety coated) Arthralgen Diffunasal Mefanamic Suprofen  Arthritis Strength Bufferin Dihydrocodeine Mepro Compound Suprol  Arthropan liquid Dopirydamole Methcarbomol with Aspirin Synalgos  ASA tablets/Enseals Disalcid Micrainin Tagament  Ascriptin Doan's Midol  Talwin  Ascriptin A/D Dolene Mobidin Tanderil  Ascriptin Extra Strength Dolobid Moblgesic Ticlid  Ascriptin with Codeine Doloprin or Doloprin with Codeine Momentum Tolectin  Asperbuf Duoprin Mono-gesic Trendar  Aspergum Duradyne Motrin or Motrin IB Triminicin  Aspirin plain, buffered or enteric coated Durasal Myochrisine Trigesic  Aspirin Suppositories Easprin Nalfon Trillsate  Aspirin with Codeine Ecotrin Regular or Extra Strength Naprosyn Uracel  Atromid-S Efficin Naproxen Ursinus  Auranofin Capsules Elmiron Neocylate Vanquish  Axotal Emagrin Norgesic Verin  Azathioprine Empirin or Empirin with Codeine Normiflo Vitamin E  Azolid Emprazil Nuprin Voltaren  Bayer Aspirin plain, buffered or children's or timed BC Tablets or powders Encaprin Orgaran Warfarin Sodium  Buff-a-Comp Enoxaparin Orudis Zorpin  Buff-a-Comp with Codeine Equegesic Os-Cal-Gesic   Buffaprin Excedrin plain, buffered or Extra Strength Oxalid   Bufferin Arthritis Strength Feldene Oxphenbutazone   Bufferin plain or Extra Strength Feldene Capsules Oxycodone with Aspirin   Bufferin with Codeine Fenoprofen Fenoprofen Pabalate or Pabalate-SF   Buffets II Flogesic Panagesic   Buffinol plain or Extra Strength Florinal or Florinal with Codeine Panwarfarin   Buf-Tabs Flurbiprofen Penicillamine   Butalbital Compound Four-way cold tablets Penicillin   Butazolidin Fragmin Pepto-Bismol   Carbenicillin Geminisyn Percodan   Carna Arthritis Reliever Geopen Persantine   Carprofen Gold's salt Persistin   Chloramphenicol Goody's Phenylbutazone   Chloromycetin Haltrain Piroxlcam   Clmetidine heparin Plaquenil   Cllnoril Hyco-pap Ponstel   Clofibrate Hydroxy chloroquine Propoxyphen         Before stopping any of these medications, be sure to consult the physician who ordered them.  Some, such as Coumadin (Warfarin) are ordered to prevent or treat serious conditions such as "deep thrombosis", "pumonary embolisms", and other heart  problems.  The amount of time that you may need off of the medication may also vary with the medication and the reason for which you were taking it.  If you are taking any of these medications, please make sure you notify your pain physician before you undergo any procedures.

## 2015-01-10 NOTE — Progress Notes (Signed)
Safety precautions to be maintained throughout the outpatient stay will include: orient to surroundings, keep bed in low position, maintain call bell within reach at all times, provide assistance with transfer out of bed and ambulation.  

## 2015-01-15 ENCOUNTER — Ambulatory Visit: Payer: Medicare Other | Admitting: Pain Medicine

## 2015-01-15 ENCOUNTER — Telehealth: Payer: Self-pay | Admitting: Pain Medicine

## 2015-01-15 NOTE — Telephone Encounter (Signed)
Had family emergency and will have to resched / patient left vmail on Sun at 4:01pm

## 2015-01-18 ENCOUNTER — Ambulatory Visit: Payer: Medicare Other | Admitting: Physician Assistant

## 2015-01-18 ENCOUNTER — Ambulatory Visit: Payer: Medicare Other | Admitting: Pain Medicine

## 2015-01-20 ENCOUNTER — Ambulatory Visit
Admission: RE | Admit: 2015-01-20 | Discharge: 2015-01-20 | Disposition: A | Discharge status: Home / Self Care | Payer: Medicare Other | Source: Ambulatory Visit | Attending: Family Medicine | Admitting: Family Medicine

## 2015-01-20 ENCOUNTER — Other Ambulatory Visit: Payer: Self-pay | Admitting: Family Medicine

## 2015-01-20 ENCOUNTER — Other Ambulatory Visit: Payer: Self-pay | Admitting: Physician Assistant

## 2015-01-20 DIAGNOSIS — J189 Pneumonia, unspecified organism: Secondary | ICD-10-CM

## 2015-01-20 DIAGNOSIS — Z09 Encounter for follow-up examination after completed treatment for conditions other than malignant neoplasm: Secondary | ICD-10-CM | POA: Insufficient documentation

## 2015-01-22 ENCOUNTER — Other Ambulatory Visit: Payer: Self-pay | Admitting: *Deleted

## 2015-01-22 MED ORDER — POTASSIUM CHLORIDE ER 10 MEQ PO TBCR: 10.0000 meq | EXTENDED_RELEASE_TABLET | Freq: Every day | ORAL | Status: DC

## 2015-01-22 NOTE — Telephone Encounter (Signed)
Pt requesting rx Klor Con 10 Meq ok to refill? I saw last OV you didn't give pt any Refills just want to verify.

## 2015-01-30 ENCOUNTER — Other Ambulatory Visit: Payer: Medicare Other

## 2015-01-30 ENCOUNTER — Other Ambulatory Visit
Admission: RE | Admit: 2015-01-30 | Discharge: 2015-01-30 | Disposition: A | Discharge status: Home / Self Care | Payer: Medicare Other | Source: Ambulatory Visit | Attending: Physician Assistant | Admitting: Physician Assistant

## 2015-01-30 DIAGNOSIS — Z79891 Long term (current) use of opiate analgesic: Secondary | ICD-10-CM | POA: Diagnosis not present

## 2015-01-30 DIAGNOSIS — Z0389 Encounter for observation for other suspected diseases and conditions ruled out: Secondary | ICD-10-CM | POA: Insufficient documentation

## 2015-01-30 DIAGNOSIS — Z79899 Other long term (current) drug therapy: Secondary | ICD-10-CM | POA: Diagnosis not present

## 2015-01-30 DIAGNOSIS — G8929 Other chronic pain: Secondary | ICD-10-CM | POA: Diagnosis not present

## 2015-01-30 DIAGNOSIS — Z5181 Encounter for therapeutic drug level monitoring: Secondary | ICD-10-CM | POA: Insufficient documentation

## 2015-01-30 LAB — LIPID PANEL
Cholesterol: 165 mg/dL (ref 0–200)
HDL: 57 mg/dL (ref >40)
LDL Cholesterol: 94 mg/dL (ref 0–99)
Total CHOL/HDL Ratio: 2.9 RATIO
Triglycerides: 69 mg/dL (ref <150)
VLDL: 14 mg/dL (ref 0–40)

## 2015-01-30 LAB — HEPATIC FUNCTION PANEL
ALT: 37 U/L (ref 14–54)
AST: 72 U/L — ABNORMAL HIGH (ref 15–41)
Albumin: 3.5 g/dL (ref 3.5–5.0)
Alkaline Phosphatase: 97 U/L (ref 38–126)
Bilirubin, Direct: <0.1 mg/dL — ABNORMAL LOW (ref 0.1–0.5)
Total Bilirubin: 0.3 mg/dL (ref 0.3–1.2)
Total Protein: 6.8 g/dL (ref 6.5–8.1)

## 2015-01-31 ENCOUNTER — Other Ambulatory Visit: Payer: Self-pay

## 2015-01-31 DIAGNOSIS — E785 Hyperlipidemia, unspecified: Secondary | ICD-10-CM

## 2015-02-05 ENCOUNTER — Encounter: Payer: Self-pay | Admitting: Pain Medicine

## 2015-02-05 ENCOUNTER — Ambulatory Visit: Payer: Medicare Other | Attending: Pain Medicine | Admitting: Pain Medicine

## 2015-02-05 VITALS — BP 118/82 | HR 60 | Temp 97.8°F | Resp 16 | Ht 66.0 in | Wt 180.0 lb

## 2015-02-05 DIAGNOSIS — M62830 Muscle spasm of back: Secondary | ICD-10-CM | POA: Insufficient documentation

## 2015-02-05 DIAGNOSIS — M47894 Other spondylosis, thoracic region: Secondary | ICD-10-CM

## 2015-02-05 DIAGNOSIS — M542 Cervicalgia: Secondary | ICD-10-CM | POA: Diagnosis present

## 2015-02-05 DIAGNOSIS — M4806 Spinal stenosis, lumbar region: Secondary | ICD-10-CM | POA: Insufficient documentation

## 2015-02-05 DIAGNOSIS — M47816 Spondylosis without myelopathy or radiculopathy, lumbar region: Secondary | ICD-10-CM

## 2015-02-05 DIAGNOSIS — M47814 Spondylosis without myelopathy or radiculopathy, thoracic region: Secondary | ICD-10-CM

## 2015-02-05 DIAGNOSIS — M5116 Intervertebral disc disorders with radiculopathy, lumbar region: Secondary | ICD-10-CM | POA: Diagnosis not present

## 2015-02-05 DIAGNOSIS — M5126 Other intervertebral disc displacement, lumbar region: Secondary | ICD-10-CM | POA: Insufficient documentation

## 2015-02-05 DIAGNOSIS — M48062 Spinal stenosis, lumbar region with neurogenic claudication: Secondary | ICD-10-CM

## 2015-02-05 DIAGNOSIS — M5136 Other intervertebral disc degeneration, lumbar region: Secondary | ICD-10-CM

## 2015-02-05 DIAGNOSIS — J189 Pneumonia, unspecified organism: Secondary | ICD-10-CM | POA: Diagnosis not present

## 2015-02-05 DIAGNOSIS — G588 Other specified mononeuropathies: Secondary | ICD-10-CM | POA: Diagnosis not present

## 2015-02-05 DIAGNOSIS — M5134 Other intervertebral disc degeneration, thoracic region: Secondary | ICD-10-CM

## 2015-02-05 DIAGNOSIS — R0781 Pleurodynia: Secondary | ICD-10-CM | POA: Diagnosis not present

## 2015-02-05 DIAGNOSIS — M533 Sacrococcygeal disorders, not elsewhere classified: Secondary | ICD-10-CM

## 2015-02-05 DIAGNOSIS — M546 Pain in thoracic spine: Secondary | ICD-10-CM | POA: Diagnosis present

## 2015-02-05 MED ORDER — METHADONE HCL 10 MG PO TABS: ORAL_TABLET | ORAL | Status: DC

## 2015-02-05 MED ORDER — GABAPENTIN 400 MG PO CAPS: ORAL_CAPSULE | ORAL | Status: DC

## 2015-02-05 NOTE — Patient Instructions (Signed)
PLAN   Continue present medication Neurontin and methado  F/U PCP Dr.Aycock   for evaliation of  BP and general medical  condition  F/U surgical evaluation. May consider pending follow-up evaluations  F/U neurological evaluation. May consider pending follow-up evaluations  May consider radiofrequency rhizolysis or intraspinal procedures pending response to present treatment and F/U evaluation

## 2015-02-05 NOTE — Progress Notes (Signed)
   Subjective:    Patient ID: Cheyenne Gray, female    DOB: [birth year], 49 y.o.   MRN: [id number]  HPI  The patient is a 49 year old female who returns to pain management for further evaluation and treatment of pain involving the neck entire back upper and lower extremity regions. Patient is with significant pain of the mid and lower back regions. Pain is increased with standing erect twisting turning maneuvers especially. Patient denies any recent trauma change in events of daily living because change in symptomatology. The patient will undergo follow-up evaluation with her [facility] for further evaluation of patient's general medical condition. Patient is with prior history of pneumonia and will undergo follow-up evaluation in this regard as well as evaluation of her general medical condition. We will avoid interventional treatment at this time and will continue Neurontin and methadone as prescribed. The patient was understanding and agreed to suggested treatment plan. The patient appeared to be with significant component of pain due to lumbar facet syndrome and may be a candidate for radiofrequency rhizolysis of the lumbar facets medial branch nerves. We will consider such treatment as discussed with patient pending follow-up evaluation. The patient agreed to suggested treatment plan     Review of Systems     Objective:   Physical Exam There was tenderness over the cervical facet cervical paraspinal musculature region as well as the splenius capitis and occipitalis musculature regions. There was tenderness over the thoracic facet thoracic paraspinal musculature region with no crepitus of the thoracic region noted. There was unremarkable Spurling's maneuver. Tinel and Phalen's maneuver were without increased pain of significant degree. Palpation over the lumbar paraspinal muscles lumbar facet region was attends to palpation of moderate degree. Lateral bending and extension  and palpation over the lumbar facets reproduce moderate severe discomfort. There was tenderness over the PSIS and PII S region of moderately severe degree. There was mild tenderness of the greater trochanteric region iliotibial band region. No definite sensory deficit or dermatomal distribution was detected. There was negative clonus negative Homans. Abdomen nontender with no costovertebral tenderness noted.       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  L4-L5 bulge with superimposed central disc protrusion slightly greater to the left. Indentation ventral aspect of the thecal sac slightly greater to the left. Facet degenerative changes. L5-S1 mild bulge with osteophyte with foraminal/lateral extension slightly greater on the left. Mild encroachment upon but not compression of the exiting L5 nerve roots. Facet joint degenerative changes.  Lumbar stenosis with neurogenic claudication  Lumbar radiculopathy    PLAN   Continue present medication Neurontin and methadone  F/U PCP Dr.Aycock   for evaliation of  BP and general medical  condition  F/U surgical evaluation. May consider pending follow-up evaluations  F/U neurological evaluation. May consider pending follow-up evaluations  May consider radiofrequency rhizolysis or intraspinal procedures pending response to present treatment and F/U evaluation  Patient is to call pain management prior to scheduled return appointment should there be change in condition or should patient have other concerns regarding condition

## 2015-02-07 ENCOUNTER — Ambulatory Visit: Payer: Medicare Other | Admitting: Pain Medicine

## 2015-02-07 ENCOUNTER — Encounter: Payer: Self-pay | Admitting: Cardiovascular Disease

## 2015-02-07 ENCOUNTER — Ambulatory Visit (INDEPENDENT_AMBULATORY_CARE_PROVIDER_SITE_OTHER): Payer: Medicare Other | Admitting: Cardiovascular Disease

## 2015-02-07 VITALS — BP 100/70 | HR 72 | Ht 66.0 in | Wt 199.5 lb

## 2015-02-07 DIAGNOSIS — I209 Angina pectoris, unspecified: Secondary | ICD-10-CM | POA: Insufficient documentation

## 2015-02-07 DIAGNOSIS — E785 Hyperlipidemia, unspecified: Secondary | ICD-10-CM

## 2015-02-07 DIAGNOSIS — I2510 Atherosclerotic heart disease of native coronary artery without angina pectoris: Secondary | ICD-10-CM

## 2015-02-07 DIAGNOSIS — R55 Syncope and collapse: Secondary | ICD-10-CM

## 2015-02-07 DIAGNOSIS — R079 Chest pain, unspecified: Secondary | ICD-10-CM | POA: Diagnosis not present

## 2015-02-07 DIAGNOSIS — I739 Peripheral vascular disease, unspecified: Secondary | ICD-10-CM

## 2015-02-07 MED ORDER — ATORVASTATIN CALCIUM 20 MG PO TABS: 20.0000 mg | ORAL_TABLET | Freq: Every day | ORAL | Status: DC

## 2015-02-07 MED ORDER — POTASSIUM CHLORIDE ER 10 MEQ PO TBCR: 10.0000 meq | EXTENDED_RELEASE_TABLET | Freq: Every day | ORAL | Status: DC | PRN

## 2015-02-07 NOTE — Assessment & Plan Note (Signed)
No recent episodes of orthostasis contributed to near syncope or syncope All blood pressure medications have been held given low blood pressure, including metoprolol

## 2015-02-07 NOTE — Assessment & Plan Note (Signed)
She continues to have episodes of rare episodes of chest pain, typically on exertion Likely has stable angina. Previously did not follow through with her stress testing. She does not want to reschedule stress test at this time CT scan reviewed with her showing significant coronary artery disease Recommended weight loss, diet modification, more aggressive cholesterol management Also recommended she call us if symptoms start to become more frequent or persistent
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
## 2015-02-07 NOTE — Assessment & Plan Note (Signed)
Surprising amount of coronary artery disease for 49 year old Stressed importance of changing diet, aggressive cholesterol management

## 2015-02-07 NOTE — Patient Instructions (Signed)
You are doing well.  Please increase lipitor up to 20 mg daily  Decrease your breads, carbs  Please call us if you have new issues that need to be addressed before your next appt.  Your physician wants you to follow-up in: 6 months.  You will receive a reminder letter in the mail two months in advance. If you don't receive a letter, please call our office to schedule the follow-up appointment.

## 2015-02-07 NOTE — Assessment & Plan Note (Signed)
Recommend she increase her Lipitor up to 20 mg daily. We'll try to achieve goal LDL less than 70, currently 94 Will likely need a 40 mg dose in the future unless she has weight loss

## 2015-02-07 NOTE — Assessment & Plan Note (Signed)
Minimal plaque noted in the aorta arch and thoracic descending aorta Given CAD, We'll continue aggressive cholesterol management

## 2015-02-15 ENCOUNTER — Other Ambulatory Visit: Payer: Self-pay | Admitting: Pain Medicine

## 2015-02-21 ENCOUNTER — Other Ambulatory Visit: Payer: Self-pay | Admitting: Family Medicine

## 2015-02-21 DIAGNOSIS — R918 Other nonspecific abnormal finding of lung field: Secondary | ICD-10-CM

## 2015-02-22 ENCOUNTER — Ambulatory Visit: Payer: Medicare Other

## 2015-02-28 ENCOUNTER — Ambulatory Visit: Payer: Medicare Other | Attending: Pain Medicine | Admitting: Pain Medicine

## 2015-02-28 ENCOUNTER — Ambulatory Visit: Payer: Medicare Other | Admitting: Cardiovascular Disease

## 2015-02-28 ENCOUNTER — Encounter: Payer: Self-pay | Admitting: Pain Medicine

## 2015-02-28 VITALS — BP 109/63 | HR 58 | Temp 97.6°F | Resp 14 | Ht 66.0 in | Wt 190.0 lb

## 2015-02-28 DIAGNOSIS — M5134 Other intervertebral disc degeneration, thoracic region: Secondary | ICD-10-CM

## 2015-02-28 DIAGNOSIS — M47894 Other spondylosis, thoracic region: Secondary | ICD-10-CM

## 2015-02-28 DIAGNOSIS — M5136 Other intervertebral disc degeneration, lumbar region: Secondary | ICD-10-CM | POA: Diagnosis not present

## 2015-02-28 DIAGNOSIS — M545 Low back pain: Secondary | ICD-10-CM | POA: Insufficient documentation

## 2015-02-28 DIAGNOSIS — M5126 Other intervertebral disc displacement, lumbar region: Secondary | ICD-10-CM | POA: Insufficient documentation

## 2015-02-28 DIAGNOSIS — M48062 Spinal stenosis, lumbar region with neurogenic claudication: Secondary | ICD-10-CM

## 2015-02-28 DIAGNOSIS — G588 Other specified mononeuropathies: Secondary | ICD-10-CM

## 2015-02-28 DIAGNOSIS — M2578 Osteophyte, vertebrae: Secondary | ICD-10-CM | POA: Insufficient documentation

## 2015-02-28 DIAGNOSIS — M47814 Spondylosis without myelopathy or radiculopathy, thoracic region: Secondary | ICD-10-CM

## 2015-02-28 DIAGNOSIS — M47816 Spondylosis without myelopathy or radiculopathy, lumbar region: Secondary | ICD-10-CM

## 2015-02-28 DIAGNOSIS — M4806 Spinal stenosis, lumbar region: Secondary | ICD-10-CM | POA: Insufficient documentation

## 2015-02-28 DIAGNOSIS — M533 Sacrococcygeal disorders, not elsewhere classified: Secondary | ICD-10-CM

## 2015-02-28 MED ORDER — BUPIVACAINE HCL (PF) 0.25 % IJ SOLN
INTRAMUSCULAR | Status: AC
  Administered 2015-02-28: 30 mL
  Filled 2015-02-28: qty 30

## 2015-02-28 MED ORDER — BUPIVACAINE HCL (PF) 0.25 % IJ SOLN
30.0000 mL | Freq: Once | INTRAMUSCULAR | Status: AC
  Administered 2015-02-28: 30 mL

## 2015-02-28 MED ORDER — GABAPENTIN 400 MG PO CAPS: ORAL_CAPSULE | ORAL | Status: DC

## 2015-02-28 MED ORDER — TRIAMCINOLONE ACETONIDE 40 MG/ML IJ SUSP
40.0000 mg | Freq: Once | INTRAMUSCULAR | Status: AC
  Administered 2015-02-28: 40 mg

## 2015-02-28 MED ORDER — METHADONE HCL 10 MG PO TABS: ORAL_TABLET | ORAL | Status: DC

## 2015-02-28 MED ORDER — MIDAZOLAM HCL 5 MG/5ML IJ SOLN
INTRAMUSCULAR | Status: AC
  Administered 2015-02-28: 5 mg via INTRAVENOUS
  Filled 2015-02-28: qty 5

## 2015-02-28 MED ORDER — FENTANYL CITRATE (PF) 100 MCG/2ML IJ SOLN
100.0000 ug | Freq: Once | INTRAMUSCULAR | Status: AC
  Administered 2015-02-28: 100 ug via INTRAVENOUS

## 2015-02-28 MED ORDER — TRIAMCINOLONE ACETONIDE 40 MG/ML IJ SUSP
INTRAMUSCULAR | Status: AC
  Administered 2015-02-28: 40 mg
  Filled 2015-02-28: qty 1

## 2015-02-28 MED ORDER — LACTATED RINGERS IV SOLN: 1000.0000 mL | INTRAVENOUS | Status: DC

## 2015-02-28 MED ORDER — MIDAZOLAM HCL 5 MG/5ML IJ SOLN
5.0000 mg | Freq: Once | INTRAMUSCULAR | Status: AC
Start: 2015-02-28 — End: 2015-02-28
  Administered 2015-02-28: 5 mg via INTRAVENOUS

## 2015-02-28 MED ORDER — FENTANYL CITRATE (PF) 100 MCG/2ML IJ SOLN
INTRAMUSCULAR | Status: AC
  Administered 2015-02-28: 100 ug via INTRAVENOUS
  Filled 2015-02-28: qty 2

## 2015-02-28 MED ORDER — ORPHENADRINE CITRATE 30 MG/ML IJ SOLN
INTRAMUSCULAR | Status: AC
  Administered 2015-02-28: 60 mg via INTRAMUSCULAR
  Filled 2015-02-28: qty 2

## 2015-02-28 MED ORDER — ORPHENADRINE CITRATE 30 MG/ML IJ SOLN
60.0000 mg | Freq: Once | INTRAMUSCULAR | Status: AC
  Administered 2015-02-28: 60 mg via INTRAMUSCULAR

## 2015-02-28 NOTE — Patient Instructions (Addendum)
PLAN   Continue present medication Neurontin and methadon  F/U PCP Dr.Aycock   for evaliation of  BP and general medical  condition  F/U surgical evaluation. May consider pending follow-up evaluations  F/U neurological evaluation. May consider pending follow-up evaluations  May consider radiofrequency rhizolysis or intraspinal procedures pending response to present treatment and F/U evaluation  Pain Management Discharge Instructions  General Discharge Instructions :  If you need to reach your doctor call: Monday-Friday 8:00 am - 4:00 pm at [phone] or toll free [phone].  After clinic hours [phone] to have operator reach doctor.  Bring all of your medication bottles to all your appointments in the pain clinic.  To cancel or reschedule your appointment with Pain Management please remember to call 24 hours in advance to avoid a fee.  Refer to the educational materials which you have been given on: General Risks, I had my Procedure. Discharge Instructions, Post Sedation.  Post Procedure Instructions:  The drugs you were given will stay in your system until tomorrow, so for the next 24 hours you should not drive, make any legal decisions or drink any alcoholic beverages.  You may eat anything you prefer, but it is better to start with liquids then soups and crackers, and gradually work up to solid foods.  Please notify your doctor immediately if you have any unusual bleeding, trouble breathing or pain that is not related to your normal pain.  Depending on the type of procedure that was done, some parts of your body may feel week and/or numb.  This usually clears up by tonight or the next day.  Walk with the use of an assistive device or accompanied by an adult for the 24 hours.  You may use ice on the affected area for the first 24 hours.  Put ice in a Ziploc bag and cover with a towel and place against area 15 minutes on 15 minutes off.  You may switch to heat after  24 hours.  A prescription for GABAPENTIN was sent to your pharmacy and should be available for pickup today. A prescription for METHADONE was given to you today.

## 2015-02-28 NOTE — Progress Notes (Signed)
Subjective:    Patient ID: Cheyenne Gray, female    DOB: September 14, 1965, 49 y.o.   MRN: [id number]  HPI  PROCEDURE PERFORMED: Lumbar facet (medial branch block)   NOTE: The patient is a 49 y.o. female who returns to [facility] for further evaluation and treatment of pain involving the lumbar and lower extremity region. 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  L4-L5 bulge with superimposed central disc protrusion slightly greater to the left. Indentation ventral aspect of the thecal sac slightly greater to the left. Facet degenerative changes. L5-S1 mild bulge with osteophyte with foraminal/lateral extension slightly greater on the left. Mild encroachment upon but not compression of the exiting L5 nerve roots. Facet joint degenerative changes. There is concern regarding patient being with significant component of pain due to facet syndrome with significant facet arthropathy. The risks, benefits, and expectations of the procedure have been discussed and explained to the patient who was understanding and in agreement with suggested treatment plan. We will proceed with interventional treatment as discussed and as explained to the patient who was understanding and wished to proceed with procedure as planned.   DESCRIPTION OF PROCEDURE: Lumbar facet (medial branch block) with IV Versed, IV fentanyl conscious sedation, EKG, blood pressure, pulse, and pulse oximetry monitoring. The procedure was performed with the patient in the prone position. Betadine prep of proposed entry site performed.   NEEDLE PLACEMENT AT: Left L 3 lumbar facet (medial branch block). Under fluoroscopic guidance with oblique orientation of 15 degrees, a 22-gauge needle was inserted at the L 3 vertebral body level with needle placed at the targeted area of Burton's Eye or Eye of the Scotty Dog with documentation of  needle placement in the superior and lateral border of targeted area of Burton's Eye or Eye of the Scotty Dog with oblique orientation of 15 degrees. Following documentation of needle placement at the L 3 vertebral body level, needle placement was then accomplished at the L 4 vertebral body level.   NEEDLE PLACEMENT AT L4 and L5 VERTEBRAL BODY LEVELS ON THE LEFT SIDE The procedure was performed at the L4 and L5 vertebral body levels exactly as was performed at the L 3 vertebral body level utilizing the same technique and under fluoroscopic guidance.  NEEDLE PLACEMENT AT THE SACRAL ALA with AP view of the lumbosacral spine. With the patient in the prone position, Betadine prep of proposed entry site accomplished, a 22 gauge needle was inserted in the region of the sacral ala (groove formed by the superior articulating process of S1 and the sacral wing). Following documentation of needle placement at the sacral ala,  needle placement was then accomplished at the S1 foramen level.   NEEDLE PLACEMENT AT THE S1 FORAMEN LEVEL under fluoroscopic guidance with AP view of the lumbosacral spine and cephalad orientation of the fluoroscope, a 22-gauge needle was placed at the superior and lateral border of the S1 foramen under fluoroscopic guidance. Following documentation of needle placement at the S1 foramen.   Needle placement was then verified at all levels on lateral view. Following documentation of needle placement at all levels on lateral view and following negative aspiration for heme and CSF, each level was injected with 1 mL of 0.25% bupivacaine with Kenalog.     LUMBAR FACET, MEDIAL BRANCH NERVE, BLOCKS PERFORMED ON THE RIGHT SIDE   The procedure was performed on the right side  exactly as was performed on the left side at the same levels and utilizing the same technique under fluoroscopic guidance.     The patient tolerated the procedure well. A total of 40 mg of Kenalog was utilized for the  procedure.   PLAN:  1. Medications: The patient will continue presently prescribed medications. Neurontin and methadone 2. May consider modification of treatment regimen at time of return appointment pending response to treatment rendered on today's visit. 3. The patient is to follow-up with primary care physician [facility]  for further evaluation of blood pressure and general medical condition status post steroid injection performed on today's visit. 4. Surgical follow-up evaluation.Has been addressed  5. Neurological follow-up evaluation.Has been addressed May consider [facility] EMG studies  6. The patient may be candidate for radiofrequency procedures, implantation type procedures, and other treatment pending response to treatment and follow-up evaluation. 7. The patient has been advised to call the Pain Management Center prior to scheduled return appointment should there be significant change in condition or should patient have other concerns regarding condition prior to scheduled return appointment.  The patient is understanding and in agreement with suggested treatment plan.   Review of Systems     Objective:   Physical Exam        Assessment & Plan:

## 2015-03-01 ENCOUNTER — Ambulatory Visit
Admission: RE | Admit: 2015-03-01 | Discharge: 2015-03-01 | Disposition: A | Discharge status: Home / Self Care | Payer: Medicare Other | Source: Ambulatory Visit | Attending: Family Medicine | Admitting: Family Medicine

## 2015-03-01 DIAGNOSIS — Z8701 Personal history of pneumonia (recurrent): Secondary | ICD-10-CM | POA: Diagnosis not present

## 2015-03-01 DIAGNOSIS — Z9049 Acquired absence of other specified parts of digestive tract: Secondary | ICD-10-CM | POA: Insufficient documentation

## 2015-03-01 DIAGNOSIS — Z9884 Bariatric surgery status: Secondary | ICD-10-CM | POA: Diagnosis not present

## 2015-03-01 DIAGNOSIS — R918 Other nonspecific abnormal finding of lung field: Secondary | ICD-10-CM | POA: Insufficient documentation

## 2015-03-06 ENCOUNTER — Inpatient Hospital Stay: Payer: Medicare Other | Attending: Internal Medicine

## 2015-03-06 ENCOUNTER — Inpatient Hospital Stay: Payer: Medicare Other

## 2015-03-27 ENCOUNTER — Encounter: Payer: Self-pay | Admitting: Pain Medicine

## 2015-03-27 ENCOUNTER — Ambulatory Visit: Payer: Medicare Other | Attending: Pain Medicine | Admitting: Pain Medicine

## 2015-03-27 VITALS — BP 110/88 | HR 70 | Temp 98.0°F | Resp 16 | Ht 62.0 in | Wt 190.0 lb

## 2015-03-27 DIAGNOSIS — M533 Sacrococcygeal disorders, not elsewhere classified: Secondary | ICD-10-CM | POA: Diagnosis not present

## 2015-03-27 DIAGNOSIS — M5126 Other intervertebral disc displacement, lumbar region: Secondary | ICD-10-CM | POA: Diagnosis not present

## 2015-03-27 DIAGNOSIS — M48062 Spinal stenosis, lumbar region with neurogenic claudication: Secondary | ICD-10-CM

## 2015-03-27 DIAGNOSIS — M5116 Intervertebral disc disorders with radiculopathy, lumbar region: Secondary | ICD-10-CM | POA: Diagnosis not present

## 2015-03-27 DIAGNOSIS — M47816 Spondylosis without myelopathy or radiculopathy, lumbar region: Secondary | ICD-10-CM | POA: Diagnosis not present

## 2015-03-27 DIAGNOSIS — M79606 Pain in leg, unspecified: Secondary | ICD-10-CM | POA: Diagnosis present

## 2015-03-27 DIAGNOSIS — M4806 Spinal stenosis, lumbar region: Secondary | ICD-10-CM | POA: Diagnosis not present

## 2015-03-27 DIAGNOSIS — M5134 Other intervertebral disc degeneration, thoracic region: Secondary | ICD-10-CM

## 2015-03-27 DIAGNOSIS — M2578 Osteophyte, vertebrae: Secondary | ICD-10-CM | POA: Diagnosis not present

## 2015-03-27 DIAGNOSIS — G588 Other specified mononeuropathies: Secondary | ICD-10-CM

## 2015-03-27 DIAGNOSIS — M5136 Other intervertebral disc degeneration, lumbar region: Secondary | ICD-10-CM

## 2015-03-27 DIAGNOSIS — M549 Dorsalgia, unspecified: Secondary | ICD-10-CM | POA: Diagnosis present

## 2015-03-27 DIAGNOSIS — M47894 Other spondylosis, thoracic region: Secondary | ICD-10-CM

## 2015-03-27 DIAGNOSIS — M542 Cervicalgia: Secondary | ICD-10-CM | POA: Diagnosis present

## 2015-03-27 DIAGNOSIS — M47814 Spondylosis without myelopathy or radiculopathy, thoracic region: Secondary | ICD-10-CM

## 2015-03-27 MED ORDER — GABAPENTIN 400 MG PO CAPS: ORAL_CAPSULE | ORAL | Status: DC

## 2015-03-27 MED ORDER — METHADONE HCL 10 MG PO TABS: ORAL_TABLET | ORAL | Status: DC

## 2015-03-27 NOTE — Progress Notes (Signed)
Safety precautions to be maintained throughout the outpatient stay will include: orient to surroundings, keep bed in low position, maintain call bell within reach at all times, provide assistance with transfer out of bed and ambulation.   Patient here s/p procedure and possible medication refill

## 2015-03-27 NOTE — Progress Notes (Signed)
   Subjective:    Patient ID: Cheyenne Gray, female    DOB: 02-07-1966, 50 y.o.   MRN: [id number]  HPI  The patient is a 50 year old female who returns to pain management for further evaluation and treatment of pain involving the neck entire back lower extremity regions. The patient has pain of the lower back region radiating to the buttocks on the left as well as the right as well as mid back pain is well. The patient is with improvement with lumbar facet blocks. At the present time we will consider patient for block of nerves to the sacroiliac joint. The patient is understanding and wished to proceed with procedure time return appointment as discussed in attempt to decrease severity of symptoms, minimize progression of symptoms, and avoid the need for more involved treatment. The patient agreed to suggested treatment plan. We will continue Neurontin and methadone as prescribed at this time.      Review of Systems     Objective:   Physical Exam  There was tenderness of the splenius capitis of the talus musculature regions of mild degree with mild tenderness of the cervical facet cervical paraspinal musculature region. The patient appeared to be with bilaterally equal grip strength and Tinel and Phalen's maneuver were without increase of pain of significant degree. There was tenderness of the acromioclavicular and glenohumeral joint regions of mild degree. No crepitus of the thoracic region was noted. Lumbar paraspinal musculature region lumbar facet region associated with moderate discomfort. Lateral bending rotation extension and palpation of the lumbar facets reproduce moderate discomfort. Straight leg raising was tolerates approximately 20 without increased pain with dorsiflexion noted. There was negative clonus negative Homans. DTRs appeared to be trace at the knees and no sensory deficit or dermatomal distribution detected. There was negative clonus negative Homans. Abdomen nontender with  no costovertebral tenderness noted.    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  L4-L5 bulge with superimposed central disc protrusion slightly greater to the left. Indentation ventral aspect of the thecal sac slightly greater to the left. Facet degenerative changes. L5-S1 mild bulge with osteophyte with foraminal/lateral extension slightly greater on the left. Mild encroachment upon but not compression of the exiting L5 nerve roots. Facet joint degenerative changes.  Lumbar stenosis with neurogenic claudication  Lumbar radiculopathy  Lumbar facet syndrome  Sacroiliac joint dysfunction      PLAN   Continue present medication Neurontin and methadone  Block of nerves to the sacroiliac joint to be performed at time of return appointment  F/U PCP Dr.Aycock   for evaliation of  BP and general medical  condition  F/U surgical evaluation. May consider pending follow-up evaluations  F/U neurological evaluation. May consider pending follow-up evaluations  May consider radiofrequency rhizolysis or intraspinal procedures pending response to present treatment and F/U evaluation May consider radiofrequency procedure as discussed today  Please call pain management prior to scheduled return appointment for any concerns

## 2015-03-27 NOTE — Patient Instructions (Addendum)
PLAN   Continue present medication Neurontin and methadone  Block of nerves to the sacroiliac joint to be performed at time of return appointment  F/U PCP Dr.Aycock   for evaliation of  BP and general medical  condition  F/U surgical evaluation. May consider pending follow-up evaluations  F/U neurological evaluation. May consider pending follow-up evaluations  May consider radiofrequency rhizolysis or intraspinal procedures pending response to present treatment and F/U evaluation May consider radiofrequency procedure as discussed today  Please call pain management prior to scheduled return appointment for any concernsSacroiliac (SI) Joint Injection Patient Information  Description: The sacroiliac joint connects the scrum (very low back and tailbone) to the ilium (a pelvic bone which also forms half of the hip joint).  Normally this joint experiences very little motion.  When this joint becomes inflamed or unstable low back and or hip and pelvis pain may result.  Injection of this joint with local anesthetics (numbing medicines) and steroids can provide diagnostic information and reduce pain.  This injection is performed with the aid of x-ray guidance into the tailbone area while you are lying on your stomach.   You may experience an electrical sensation down the leg while this is being done.  You may also experience numbness.  We also may ask if we are reproducing your normal pain during the injection.  Conditions which may be treated SI injection:   Low back, buttock, hip or leg pain  Preparation for the Injection:  1. Do not eat any solid food or dairy products within 6 hours of your appointment.  2. You may drink clear liquids up to 2 hours before appointment.  Clear liquids include water, black coffee, juice or soda.  No milk or cream please. 3. You may take your regular medications, including pain medications with a sip of water before your appointment.  Diabetics should hold  regular insulin (if take separately) and take 1/2 normal NPH dose the morning of the procedure.  Carry some sugar containing items with you to your appointment. 4. A driver must accompany you and be prepared to drive you home after your procedure. 5. Bring all of your current medications with you. 6. An IV may be inserted and sedation may be given at the discretion of the physician. 7. A blood pressure cuff, EKG and other monitors will often be applied during the procedure.  Some patients may need to have extra oxygen administered for a short period.  8. You will be asked to provide medical information, including your allergies, prior to the procedure.  We must know immediately if you are taking blood thinners (like Coumadin/Warfarin) or if you are allergic to IV iodine contrast (dye).  We must know if you could possible be pregnant.  Possible side effects:   Bleeding from needle site  Infection (rare, may require surgery)  Nerve injury (rare)  Numbness & tingling (temporary)  A brief convulsion or seizure  Light-headedness (temporary)  Pain at injection site (several days)  Decreased blood pressure (temporary)  Weakness in the leg (temporary)   Call if you experience:   New onset weakness or numbness of an extremity below the injection site that last more than 8 hours.  Hives or difficulty breathing ( go to the emergency room)  Inflammation or drainage at the injection site  Any new symptoms which are concerning to you  Please note:  Although the local anesthetic injected can often make your back/ hip/ buttock/ leg feel good for several hours after  the injections, the pain will likely return.  It takes 3-7 days for steroids to work in the sacroiliac area.  You may not notice any pain relief for at least that one week.  If effective, we will often do a series of three injections spaced 3-6 weeks apart to maximally decrease your pain.  After the initial series, we generally  will wait some months before a repeat injection of the same type.  If you have any questions, please call [phone] [facility]  GENERAL RISKS AND COMPLICATIONS  What are the risk, side effects and possible complications? Generally speaking, most procedures are safe.  However, with any procedure there are risks, side effects, and the possibility of complications.  The risks and complications are dependent upon the sites that are lesioned, or the type of nerve block to be performed.  The closer the procedure is to the spine, the more serious the risks are.  Great care is taken when placing the radio frequency needles, block needles or lesioning probes, but sometimes complications can occur. 1. Infection: Any time there is an injection through the skin, there is a risk of infection.  This is why sterile conditions are used for these blocks.  There are four possible types of infection. 1. Localized skin infection. 2. Central Nervous System Infection-This can be in the form of Meningitis, which can be deadly. 3. Epidural Infections-This can be in the form of an epidural abscess, which can cause pressure inside of the spine, causing compression of the spinal cord with subsequent paralysis. This would require an emergency surgery to decompress, and there are no guarantees that the patient would recover from the paralysis. 4. Discitis-This is an infection of the intervertebral discs.  It occurs in about 1% of discography procedures.  It is difficult to treat and it may lead to surgery.        2. Pain: the needles have to go through skin and soft tissues, will cause soreness.       3. Damage to internal structures:  The nerves to be lesioned may be near blood vessels or    other nerves which can be potentially damaged.       4. Bleeding: Bleeding is more common if the patient is taking blood thinners such as  aspirin, Coumadin, Ticiid, Plavix, etc., or if he/she have  some genetic predisposition  such as hemophilia. Bleeding into the spinal canal can cause compression of the spinal  cord with subsequent paralysis.  This would require an emergency surgery to  decompress and there are no guarantees that the patient would recover from the  paralysis.       5. Pneumothorax:  Puncturing of a lung is a possibility, every time a needle is introduced in  the area of the chest or upper back.  Pneumothorax refers to free air around the  collapsed lung(s), inside of the thoracic cavity (chest cavity).  Another two possible  complications related to a similar event would include: Hemothorax and Chylothorax.   These are variations of the Pneumothorax, where instead of air around the collapsed  lung(s), you may have blood or chyle, respectively.       6. Spinal headaches: They may occur with any procedures in the area of the spine.       7. Persistent CSF (Cerebro-Spinal Fluid) leakage: This is a rare problem, but may occur  with prolonged intrathecal or epidural catheters either due to the formation of a  fistulous  track or a dural tear.       8. Nerve damage: By working so close to the spinal cord, there is always a possibility of  nerve damage, which could be as serious as a permanent spinal cord injury with  paralysis.       9. Death:  Although rare, severe deadly allergic reactions known as "Anaphylactic  reaction" can occur to any of the medications used.      10. Worsening of the symptoms:  We can always make thing worse.  What are the chances of something like this happening? Chances of any of this occuring are extremely low.  By statistics, you have more of a chance of getting killed in a motor vehicle accident: while driving to the hospital than any of the above occurring .  Nevertheless, you should be aware that they are possibilities.  In general, it is similar to taking a shower.  Everybody knows that you can slip, hit your head and get killed.  Does that mean that you  should not shower again?  Nevertheless always keep in mind that statistics do not mean anything if you happen to be on the wrong side of them.  Even if a procedure has a 1 (one) in a 1,000,000 (million) chance of going wrong, it you happen to be that one..Also, keep in mind that by statistics, you have more of a chance of having something go wrong when taking medications.  Who should not have this procedure? If you are on a blood thinning medication (e.g. Coumadin, Plavix, see list of "Blood Thinners"), or if you have an active infection going on, you should not have the procedure.  If you are taking any blood thinners, please inform your physician.  How should I prepare for this procedure?  Do not eat or drink anything at least six hours prior to the procedure.  Bring a driver with you .  It cannot be a taxi.  Come accompanied by an adult that can drive you back, and that is strong enough to help you if your legs get weak or numb from the local anesthetic.  Take all of your medicines the morning of the procedure with just enough water to swallow them.  If you have diabetes, make sure that you are scheduled to have your procedure done first thing in the morning, whenever possible.  If you have diabetes, take only half of your insulin dose and notify our nurse that you have done so as soon as you arrive at the clinic.  If you are diabetic, but only take blood sugar pills (oral hypoglycemic), then do not take them on the morning of your procedure.  You may take them after you have had the procedure.  Do not take aspirin or any aspirin-containing medications, at least eleven (11) days prior to the procedure.  They may prolong bleeding.  Wear loose fitting clothing that may be easy to take off and that you would not mind if it got stained with Betadine or blood.  Do not wear any jewelry or perfume  Remove any nail coloring.  It will interfere with some of our monitoring equipment.  NOTE:  Remember that this is not meant to be interpreted as a complete list of all possible complications.  Unforeseen problems may occur.  BLOOD THINNERS The following drugs contain aspirin or other products, which can cause increased bleeding during surgery and should not be taken for 2 weeks prior to and 1 week  after surgery.  If you should need take something for relief of minor pain, you may take acetaminophen which is found in Tylenol,m Datril, Anacin-3 and Panadol. It is not blood thinner. The products listed below are.  Do not take any of the products listed below in addition to any listed on your instruction sheet.  A.P.C or A.P.C with Codeine Codeine Phosphate Capsules #3 Ibuprofen Ridaura  ABC compound Congesprin Imuran rimadil  Advil Cope Indocin Robaxisal  Alka-Seltzer Effervescent Pain Reliever and Antacid Coricidin or Coricidin-D  Indomethacin Rufen  Alka-Seltzer plus Cold Medicine Cosprin Ketoprofen S-A-C Tablets  Anacin Analgesic Tablets or Capsules Coumadin Korlgesic Salflex  Anacin Extra Strength Analgesic tablets or capsules CP-2 Tablets Lanoril Salicylate  Anaprox Cuprimine Capsules Levenox Salocol  Anexsia-D Dalteparin Magan Salsalate  Anodynos Darvon compound Magnesium Salicylate Sine-off  Ansaid Dasin Capsules Magsal Sodium Salicylate  Anturane Depen Capsules Marnal Soma  APF Arthritis pain formula Dewitt's Pills Measurin Stanback  Argesic Dia-Gesic Meclofenamic Sulfinpyrazone  Arthritis Bayer Timed Release Aspirin Diclofenac Meclomen Sulindac  Arthritis pain formula Anacin Dicumarol Medipren Supac  Analgesic (Safety coated) Arthralgen Diffunasal Mefanamic Suprofen  Arthritis Strength Bufferin Dihydrocodeine Mepro Compound Suprol  Arthropan liquid Dopirydamole Methcarbomol with Aspirin Synalgos  ASA tablets/Enseals Disalcid Micrainin Tagament  Ascriptin Doan's Midol Talwin  Ascriptin A/D Dolene Mobidin Tanderil  Ascriptin Extra Strength Dolobid Moblgesic Ticlid   Ascriptin with Codeine Doloprin or Doloprin with Codeine Momentum Tolectin  Asperbuf Duoprin Mono-gesic Trendar  Aspergum Duradyne Motrin or Motrin IB Triminicin  Aspirin plain, buffered or enteric coated Durasal Myochrisine Trigesic  Aspirin Suppositories Easprin Nalfon Trillsate  Aspirin with Codeine Ecotrin Regular or Extra Strength Naprosyn Uracel  Atromid-S Efficin Naproxen Ursinus  Auranofin Capsules Elmiron Neocylate Vanquish  Axotal Emagrin Norgesic Verin  Azathioprine Empirin or Empirin with Codeine Normiflo Vitamin E  Azolid Emprazil Nuprin Voltaren  Bayer Aspirin plain, buffered or children's or timed BC Tablets or powders Encaprin Orgaran Warfarin Sodium  Buff-a-Comp Enoxaparin Orudis Zorpin  Buff-a-Comp with Codeine Equegesic Os-Cal-Gesic   Buffaprin Excedrin plain, buffered or Extra Strength Oxalid   Bufferin Arthritis Strength Feldene Oxphenbutazone   Bufferin plain or Extra Strength Feldene Capsules Oxycodone with Aspirin   Bufferin with Codeine Fenoprofen Fenoprofen Pabalate or Pabalate-SF   Buffets II Flogesic Panagesic   Buffinol plain or Extra Strength Florinal or Florinal with Codeine Panwarfarin   Buf-Tabs Flurbiprofen Penicillamine   Butalbital Compound Four-way cold tablets Penicillin   Butazolidin Fragmin Pepto-Bismol   Carbenicillin Geminisyn Percodan   Carna Arthritis Reliever Geopen Persantine   Carprofen Gold's salt Persistin   Chloramphenicol Goody's Phenylbutazone   Chloromycetin Haltrain Piroxlcam   Clmetidine heparin Plaquenil   Cllnoril Hyco-pap Ponstel   Clofibrate Hydroxy chloroquine Propoxyphen         Before stopping any of these medications, be sure to consult the physician who ordered them.  Some, such as Coumadin (Warfarin) are ordered to prevent or treat serious conditions such as "deep thrombosis", "pumonary embolisms", and other heart problems.  The amount of time that you may need off of the medication may also vary with the medication  and the reason for which you were taking it.  If you are taking any of these medications, please make sure you notify your pain physician before you undergo any procedures.

## 2015-04-02 ENCOUNTER — Ambulatory Visit: Payer: Medicare Other | Attending: Pain Medicine | Admitting: Pain Medicine

## 2015-04-02 ENCOUNTER — Encounter: Payer: Self-pay | Admitting: Pain Medicine

## 2015-04-02 VITALS — BP 114/72 | HR 58 | Temp 98.0°F | Resp 17 | Ht 65.0 in | Wt 190.0 lb

## 2015-04-02 DIAGNOSIS — M79606 Pain in leg, unspecified: Secondary | ICD-10-CM | POA: Diagnosis present

## 2015-04-02 DIAGNOSIS — G588 Other specified mononeuropathies: Secondary | ICD-10-CM

## 2015-04-02 DIAGNOSIS — M47816 Spondylosis without myelopathy or radiculopathy, lumbar region: Secondary | ICD-10-CM | POA: Diagnosis not present

## 2015-04-02 DIAGNOSIS — M4806 Spinal stenosis, lumbar region: Secondary | ICD-10-CM | POA: Diagnosis not present

## 2015-04-02 DIAGNOSIS — M51369 Other intervertebral disc degeneration, lumbar region without mention of lumbar back pain or lower extremity pain: Secondary | ICD-10-CM

## 2015-04-02 DIAGNOSIS — M47814 Spondylosis without myelopathy or radiculopathy, thoracic region: Secondary | ICD-10-CM

## 2015-04-02 DIAGNOSIS — M545 Low back pain: Secondary | ICD-10-CM | POA: Insufficient documentation

## 2015-04-02 DIAGNOSIS — M5136 Other intervertebral disc degeneration, lumbar region: Secondary | ICD-10-CM | POA: Diagnosis not present

## 2015-04-02 DIAGNOSIS — M5134 Other intervertebral disc degeneration, thoracic region: Secondary | ICD-10-CM

## 2015-04-02 DIAGNOSIS — M533 Sacrococcygeal disorders, not elsewhere classified: Secondary | ICD-10-CM

## 2015-04-02 DIAGNOSIS — M2578 Osteophyte, vertebrae: Secondary | ICD-10-CM | POA: Diagnosis not present

## 2015-04-02 DIAGNOSIS — M47894 Other spondylosis, thoracic region: Secondary | ICD-10-CM

## 2015-04-02 DIAGNOSIS — M791 Myalgia: Secondary | ICD-10-CM | POA: Diagnosis present

## 2015-04-02 DIAGNOSIS — M48062 Spinal stenosis, lumbar region with neurogenic claudication: Secondary | ICD-10-CM

## 2015-04-02 MED ORDER — TRIAMCINOLONE ACETONIDE 40 MG/ML IJ SUSP
40.0000 mg | Freq: Once | INTRAMUSCULAR | Status: AC
  Administered 2015-04-02: 40 mg

## 2015-04-02 MED ORDER — TRIAMCINOLONE ACETONIDE 40 MG/ML IJ SUSP
INTRAMUSCULAR | Status: AC
  Administered 2015-04-02: 40 mg
  Filled 2015-04-02: qty 1

## 2015-04-02 MED ORDER — ORPHENADRINE CITRATE 30 MG/ML IJ SOLN
INTRAMUSCULAR | Status: AC
  Administered 2015-04-02: 60 mg via INTRAMUSCULAR
  Filled 2015-04-02: qty 2

## 2015-04-02 MED ORDER — FENTANYL CITRATE (PF) 100 MCG/2ML IJ SOLN
100.0000 ug | Freq: Once | INTRAMUSCULAR | Status: AC
  Administered 2015-04-02: 100 ug via INTRAVENOUS

## 2015-04-02 MED ORDER — BUPIVACAINE HCL (PF) 0.25 % IJ SOLN
INTRAMUSCULAR | Status: AC
  Administered 2015-04-02: 30 mL
  Filled 2015-04-02: qty 30

## 2015-04-02 MED ORDER — BUPIVACAINE HCL (PF) 0.25 % IJ SOLN
30.0000 mL | Freq: Once | INTRAMUSCULAR | Status: AC
  Administered 2015-04-02: 30 mL

## 2015-04-02 MED ORDER — ORPHENADRINE CITRATE 30 MG/ML IJ SOLN
60.0000 mg | Freq: Once | INTRAMUSCULAR | Status: AC
  Administered 2015-04-02: 60 mg via INTRAMUSCULAR

## 2015-04-02 MED ORDER — MIDAZOLAM HCL 5 MG/5ML IJ SOLN
5.0000 mg | Freq: Once | INTRAMUSCULAR | Status: AC
  Administered 2015-04-02: 5 mg via INTRAVENOUS

## 2015-04-02 MED ORDER — MIDAZOLAM HCL 5 MG/5ML IJ SOLN
INTRAMUSCULAR | Status: AC
  Administered 2015-04-02: 5 mg via INTRAVENOUS
  Filled 2015-04-02: qty 5

## 2015-04-02 MED ORDER — FENTANYL CITRATE (PF) 100 MCG/2ML IJ SOLN
INTRAMUSCULAR | Status: AC
  Administered 2015-04-02: 100 ug via INTRAVENOUS
  Filled 2015-04-02: qty 2

## 2015-04-02 MED ORDER — LACTATED RINGERS IV SOLN: 1000.0000 mL | INTRAVENOUS | Status: DC

## 2015-04-02 NOTE — Progress Notes (Signed)
Safety precautions to be maintained throughout the outpatient stay will include: orient to surroundings, keep bed in low position, maintain call bell within reach at all times, provide assistance with transfer out of bed and ambulation.  

## 2015-04-02 NOTE — Patient Instructions (Addendum)
PLAN  Continue present medication Neurontin and methadone and antibiotic  F/U PCP for evaliation of  BP and general medical  condition.  F/U surgical evaluation. May consider pending follow-up evaluations  F/U neurological evaluation. May consider pending follow-up evaluations  May consider radiofrequency rhizolysis or intraspinal procedures pending response to present treatment and F/U evaluation.  Patient to call Pain Management Center should patient have concerns prior to scheduled return appointment.Sacroiliac (SI) Joint Injection Patient Information  Description: The sacroiliac joint connects the scrum (very low back and tailbone) to the ilium (a pelvic bone which also forms half of the hip joint).  Normally this joint experiences very little motion.  When this joint becomes inflamed or unstable low back and or hip and pelvis pain may result.  Injection of this joint with local anesthetics (numbing medicines) and steroids can provide diagnostic information and reduce pain.  This injection is performed with the aid of x-ray guidance into the tailbone area while you are lying on your stomach.   You may experience an electrical sensation down the leg while this is being done.  You may also experience numbness.  We also may ask if we are reproducing your normal pain during the injection.  Conditions which may be treated SI injection:   Low back, buttock, hip or leg pain  Preparation for the Injection:  1. Do not eat any solid food or dairy products within 6 hours of your appointment.  2. You may drink clear liquids up to 2 hours before appointment.  Clear liquids include water, black coffee, juice or soda.  No milk or cream please. 3. You may take your regular medications, including pain medications with a sip of water before your appointment.  Diabetics should hold regular insulin (if take separately) and take 1/2 normal NPH dose the morning of the procedure.  Carry some sugar containing  items with you to your appointment. 4. A driver must accompany you and be prepared to drive you home after your procedure. 5. Bring all of your current medications with you. 6. An IV may be inserted and sedation may be given at the discretion of the physician. 7. A blood pressure cuff, EKG and other monitors will often be applied during the procedure.  Some patients may need to have extra oxygen administered for a short period.  8. You will be asked to provide medical information, including your allergies, prior to the procedure.  We must know immediately if you are taking blood thinners (like Coumadin/Warfarin) or if you are allergic to IV iodine contrast (dye).  We must know if you could possible be pregnant.  Possible side effects:   Bleeding from needle site  Infection (rare, may require surgery)  Nerve injury (rare)  Numbness & tingling (temporary)  A brief convulsion or seizure  Light-headedness (temporary)  Pain at injection site (several days)  Decreased blood pressure (temporary)  Weakness in the leg (temporary)   Call if you experience:   New onset weakness or numbness of an extremity below the injection site that last more than 8 hours.  Hives or difficulty breathing ( go to the emergency room)  Inflammation or drainage at the injection site  Any new symptoms which are concerning to you  Please note:  Although the local anesthetic injected can often make your back/ hip/ buttock/ leg feel good for several hours after the injections, the pain will likely return.  It takes 3-7 days for steroids to work in the sacroiliac area.  You  may not notice any pain relief for at least that one week.  If effective, we will often do a series of three injections spaced 3-6 weeks apart to maximally decrease your pain.  After the initial series, we generally will wait some months before a repeat injection of the same type.  If you have any questions, please call [phone]  [phone] [facility]  Pain Management Discharge Instructions  General Discharge Instructions :  If you need to reach your doctor call: Monday-Friday 8:00 am - 4:00 pm at [phone] or toll free [phone].  After clinic hours [phone] to have operator reach doctor.  Bring all of your medication bottles to all your appointments in the pain clinic.  To cancel or reschedule your appointment with Pain Management please remember to call 24 hours in advance to avoid a fee.  Refer to the educational materials which you have been given on: General Risks, I had my Procedure. Discharge Instructions, Post Sedation.  Post Procedure Instructions:  The drugs you were given will stay in your system until tomorrow, so for the next 24 hours you should not drive, make any legal decisions or drink any alcoholic beverages.  You may eat anything you prefer, but it is better to start with liquids then soups and crackers, and gradually work up to solid foods.  Please notify your doctor immediately if you have any unusual bleeding, trouble breathing or pain that is not related to your normal pain.  Depending on the type of procedure that was done, some parts of your body may feel week and/or numb.  This usually clears up by tonight or the next day.  Walk with the use of an assistive device or accompanied by an adult for the 24 hours.  You may use ice on the affected area for the first 24 hours.  Put ice in a Ziploc bag and cover with a towel and place against area 15 minutes on 15 minutes off.  You may switch to heat after 24 hours.

## 2015-04-02 NOTE — Progress Notes (Signed)
Subjective:    Patient ID: Cheyenne DillingJodie M Gray, female    DOB: Jun 11, 1965, 50 y.o.   MRN: [id number]  HPI  .PROCEDURE:  Block of nerves to the sacroiliac joint.   NOTE:  The patient is a 50 y.o. female who returns to the [facility] for further evaluation and treatment of pain involving the lower back and lower extremity region with pain in the region of the buttocks as well. Prior MRI studies reveal Degenerative disc disease lumbar spine L3 L4 multifactorial moderate to marked spinal stenosis.  L4-L5 bulge with superimposed central disc protrusion slightly greater to the left. Indentation ventral aspect of the thecal sac slightly greater to the left. Facet degenerative changes. L5-S1 mild bulge with osteophyte with foraminal/lateral extension slightly greater on the left. Mild encroachment upon but not compression of the exiting L5 nerve roots. Facet joint degenerative changes..  the patient's pain is aggravated by standing and climbing stairs and patient is with positive Patrick's maneuver. Palpation over the PSIS and PII S regions reproduces severe pain. There is concern regarding a significant component of the patient's pain being due to sacroiliac joint dysfunction The risks, benefits, expectations of the procedure have been discussed and explained to the patient who is understanding and willing to proceed with interventional treatment in attempt to decrease severity of patient's symptoms, minimize the risk of medication escalation and  hopefully retard the progression of the patient's symptoms. We will proceed with what is felt to be a medically necessary procedure, block of nerves to the sacroiliac joint. . The patient also is a pain of the cervical region with pain of the cervical region reproducing pain and precipitating headaches as well  DESCRIPTION OF PROCEDURE:  Block of nerves to the sacroiliac joint.   The patient was taken to the fluoroscopy suite. With the patient in the  prone position with EKG, blood pressure, pulse and pulse oximetry monitoring, IV Versed, IV fentanyl conscious sedation, Betadine prep of proposed entry site was performed.   Block of nerves at the L5 vertebral body level.   With the patient in prone position, under fluoroscopic guidance, a 22 -gauge needle was inserted at the L5 vertebral body level on the left side. With 15 degrees oblique orientation a 22 -gauge needle was inserted in the region known as Burton's eye or eye of the Scotty dog. Following documentation of needle placement in the area of Burton's eye or eye of the Scotty dog under fluoroscopic guidance, needle placement was then accomplished at the sacral ala level on the left side.   Needle placement at the sacral ala.   With the patient in prone position under fluoroscopic guidance with AP view of the lumbosacral spine, a 22 -gauge needle was inserted in the region known as the sacral ala on the left side. Following documentation of needle placement on the left side under fluoroscopic guidance needle placement was then accomplished at the S1 foramen level.   Needle placement at the S1 foramen level.   With the patient in prone position under fluoroscopic guidance with AP view of the lumbosacral spine and cephalad orientation, a 22 -gauge needle was inserted at the superior and lateral border of the S1 foramen on the left side. Following documentation of needle placement at the S1 foramen level on the left side, needle placement was then accomplished at the S2 foramen level on the left side.   Needle placement at the S2 foramen level.   With the patient in prone position  with AP view of the lumbosacral spine with cephalad orientation, a 22 - gauge needle was inserted at the superior and lateral border of the S2 foramen under fluoroscopic guidance on the left side. Following needle placement at the L5 vertebral body level, sacral ala, S1 foramen and S2 foramen on the left side, needle  placement was verified on lateral view under fluoroscopic guidance.  Following needle placement documentation on lateral view, each needle was injected with 1 mL of 0.25% bupivacaine and Kenalog.   BLOCK OF THE NERVES TO SACROILIAC JOINT ON THE RIGHT SIDE The procedure was performed on the right side at the same levels as was performed on the left side and utilizing the same technique as on the left side and was performed under fluoroscopic guidance as on the left side  Myoneural block injections of the cervical region Following alcohol prep of proposed entry site a 22-gauge needle was inserted in the cervical paraspinal musculature region and following negative aspiration 1 cc of 0.25% bupivacaine with Norflex was injected for myoneural block injections of the cervical region 4  The patient tolerated procedure well  A total of 10mg  of Kenalog was utilized for the procedure.   PLAN:  1. Medications: The patient will continue presently prescribed medications. Neurontin and methadone  2. The patient will be considered for modification of treatment regimen pending response to the procedure performed on today's visit.  3. The patient is to follow-up with primary care physician Dr. Letta Pate for evaluation of blood pressure and general medical condition following the procedure performed on today's visit.  4. Surgical evaluation as discussed.  5. Neurological evaluation as discussed.  6. The patient may be a candidate for radiofrequency procedures, implantation devices and other treatment pending response to treatment performed on today's visit and follow-up evaluation.  7. The patient has been advised to adhere to proper body mechanics and to avoid activities which may exacerbate the patient's symptoms.   Return appointment to [facility] as scheduled.    Review of Systems     Objective:   Physical Exam        Assessment & Plan:

## 2015-04-03 ENCOUNTER — Telehealth: Payer: Self-pay | Admitting: *Deleted

## 2015-04-03 NOTE — Telephone Encounter (Signed)
Left message

## 2015-04-25 ENCOUNTER — Encounter: Payer: Self-pay | Admitting: Pain Medicine

## 2015-04-25 ENCOUNTER — Ambulatory Visit: Payer: Medicare Other | Attending: Pain Medicine | Admitting: Pain Medicine

## 2015-04-25 VITALS — BP 112/73 | HR 59 | Temp 98.1°F | Resp 16 | Ht 66.0 in | Wt 190.0 lb

## 2015-04-25 DIAGNOSIS — M4806 Spinal stenosis, lumbar region: Secondary | ICD-10-CM | POA: Diagnosis not present

## 2015-04-25 DIAGNOSIS — M5416 Radiculopathy, lumbar region: Secondary | ICD-10-CM

## 2015-04-25 DIAGNOSIS — M47816 Spondylosis without myelopathy or radiculopathy, lumbar region: Secondary | ICD-10-CM

## 2015-04-25 DIAGNOSIS — M2578 Osteophyte, vertebrae: Secondary | ICD-10-CM | POA: Diagnosis not present

## 2015-04-25 DIAGNOSIS — M5134 Other intervertebral disc degeneration, thoracic region: Secondary | ICD-10-CM

## 2015-04-25 DIAGNOSIS — M47896 Other spondylosis, lumbar region: Secondary | ICD-10-CM | POA: Diagnosis not present

## 2015-04-25 DIAGNOSIS — M5116 Intervertebral disc disorders with radiculopathy, lumbar region: Secondary | ICD-10-CM | POA: Insufficient documentation

## 2015-04-25 DIAGNOSIS — M48062 Spinal stenosis, lumbar region with neurogenic claudication: Secondary | ICD-10-CM

## 2015-04-25 DIAGNOSIS — M533 Sacrococcygeal disorders, not elsewhere classified: Secondary | ICD-10-CM | POA: Diagnosis not present

## 2015-04-25 DIAGNOSIS — M5126 Other intervertebral disc displacement, lumbar region: Secondary | ICD-10-CM | POA: Insufficient documentation

## 2015-04-25 DIAGNOSIS — M545 Low back pain: Secondary | ICD-10-CM | POA: Diagnosis present

## 2015-04-25 DIAGNOSIS — M5136 Other intervertebral disc degeneration, lumbar region: Secondary | ICD-10-CM

## 2015-04-25 DIAGNOSIS — M79606 Pain in leg, unspecified: Secondary | ICD-10-CM | POA: Diagnosis present

## 2015-04-25 DIAGNOSIS — M47814 Spondylosis without myelopathy or radiculopathy, thoracic region: Secondary | ICD-10-CM

## 2015-04-25 DIAGNOSIS — M51369 Other intervertebral disc degeneration, lumbar region without mention of lumbar back pain or lower extremity pain: Secondary | ICD-10-CM

## 2015-04-25 DIAGNOSIS — G588 Other specified mononeuropathies: Secondary | ICD-10-CM

## 2015-04-25 DIAGNOSIS — M47894 Other spondylosis, thoracic region: Secondary | ICD-10-CM

## 2015-04-25 MED ORDER — METHADONE HCL 10 MG PO TABS: ORAL_TABLET | ORAL | Status: DC

## 2015-04-25 MED ORDER — GABAPENTIN 400 MG PO CAPS: ORAL_CAPSULE | ORAL | Status: DC

## 2015-04-25 NOTE — Patient Instructions (Addendum)
PLAN  Continue present medication Neurontin and methadone   Lumbar epidural steroid injection to be performed at time of return appointment  F/U PCP Dr.Aycock  for evaliation of  BP and general medical  condition.  F/U surgical evaluation .Marland Kitchen Please ask receptionist and nurses the date of your neurosurgical evaluation  F/U neurological evaluation. May consider PNCV/EMG pending follow-up evaluations  May consider radiofrequency rhizolysis or intraspinal procedures pending response to present treatment and F/U evaluation.  Patient to call [facility] Management Center should patient have concerns prior to scheduled return appointment.Epidural Steroid Injection Patient Information  Description: The epidural space surrounds the nerves as they exit the spinal cord.  In some patients, the nerves can be compressed and inflamed by a bulging disc or a tight spinal canal (spinal stenosis).  By injecting steroids into the epidural space, we can bring irritated nerves into direct contact with a potentially helpful medication.  These steroids act directly on the irritated nerves and can reduce swelling and inflammation which often leads to decreased pain.  Epidural steroids may be injected anywhere along the spine and from the neck to the low back depending upon the location of your pain.   After numbing the skin with local anesthetic (like Novocaine), a small needle is passed into the epidural space slowly.  You may experience a sensation of pressure while this is being done.  The entire block usually last less than 10 minutes.  Conditions which may be treated by epidural steroids:   Low back and leg pain  Neck and arm pain  Spinal stenosis  Post-laminectomy syndrome  Herpes zoster (shingles) pain  Pain from compression fractures  Preparation for the injection:  1. Do not eat any solid food or dairy products within 6 hours of your appointment.  2. You may drink clear liquids up to 2 hours before  appointment.  Clear liquids include water, black coffee, juice or soda.  No milk or cream please. 3. You may take your regular medication, including pain medications, with a sip of water before your appointment  Diabetics should hold regular insulin (if taken separately) and take 1/2 normal NPH dos the morning of the procedure.  Carry some sugar containing items with you to your appointment. 4. A driver must accompany you and be prepared to drive you home after your procedure.  5. Bring all your current medications with your. 6. An IV may be inserted and sedation may be given at the discretion of the physician.   7. A blood pressure cuff, EKG and other monitors will often be applied during the procedure.  Some patients may need to have extra oxygen administered for a short period. 8. You will be asked to provide medical information, including your allergies, prior to the procedure.  We must know immediately if you are taking blood thinners (like Coumadin/Warfarin)  Or if you are allergic to IV iodine contrast (dye). We must know if you could possible be pregnant.  Possible side-effects:  Bleeding from needle site  Infection (rare, may require surgery)  Nerve injury (rare)  Numbness & tingling (temporary)  Difficulty urinating (rare, temporary)  Spinal headache ( a headache worse with upright posture)  Light -headedness (temporary)  Pain at injection site (several days)  Decreased blood pressure (temporary)  Weakness in arm/leg (temporary)  Pressure sensation in back/neck (temporary)  Call if you experience:  Fever/chills associated with headache or increased back/neck pain.  Headache worsened by an upright position.  New onset weakness or numbness of  an extremity below the injection site  Hives or difficulty breathing (go to the emergency room)  Inflammation or drainage at the infection site  Severe back/neck pain  Any new symptoms which are concerning to you  Please  note:  Although the local anesthetic injected can often make your back or neck feel good for several hours after the injection, the pain will likely return.  It takes 3-7 days for steroids to work in the epidural space.  You may not notice any pain relief for at least that one week.  If effective, we will often do a series of three injections spaced 3-6 weeks apart to maximally decrease your pain.  After the initial series, we generally will wait several months before considering a repeat injection of the same type.  If you have any questions, please call [phone] [facility]

## 2015-04-25 NOTE — Progress Notes (Signed)
Safety precautions to be maintained throughout the outpatient stay will include: orient to surroundings, keep bed in low position, maintain call bell within reach at all times, provide assistance with transfer out of bed and ambulation.  

## 2015-04-25 NOTE — Progress Notes (Signed)
Subjective:    Patient ID: Cheyenne Gray, female    DOB: Feb 21, 1966, 50 y.o.   MRN: [id number]  HPI  The patient is a 50 year old female who returns to pain management for further evaluation and treatment of pain involving the mid lower back and lower extremity region the patient states that his been return appointment of significant degree with pain occurring across the back radiating to the lower extremities. We reviewed patient's MRI findings on today's visit and discussed patient's symptoms. The patient states that the pain of the lower back increases as patient spends more time on the feet the lower back pain becoming more intense and radiating to the lower extremities. The patient states the pain eventually is associated with weakness of the lower extremities. We discussed patient's condition and will schedule patient for lumbar epidural steroid injection to be performed at time return appointment in attempt to decrease severity of symptoms, minimize progression of symptoms, and avoid the need for more involved treatment. We will also schedule patient for neurosurgical evaluation for further discussion of patient's condition and recommendations regarding treatment of patient's condition. The patient will continue Neurontin and methadone as prescribed at this time. The patient was with understanding and agreed with suggested treatment plan       Review of Systems     Objective:   Physical Exam There was tenderness of the splenius capitis and occipitalis muscles region a mild degree there was mild tenderness over the cervical facet cervical paraspinal musculature region with tenderness of the acromioclavicular and glenohumeral joint regions reproducing mild discomfort. Patient appeared to be with Tinel and Phalen's maneuver reproducing minimal discomfort and appeared to be with bilaterally equal grip strength. Palpation of the thoracic facet thoracic paraspinal musculature region was  attends to palpation of moderate to moderately severe degree with moderate muscle spasms noted in the mid and lower thoracic paraspinal muscular region. Palpation over the lumbar paraspinal musculatures and lumbar facet region was associated with moderate to moderately severe discomfort with lateral bending rotation extension and palpation of the lumbar facets reproducing moderately severe discomfort. Straight leg raise was tolerated to approximately 20 without a definite increased pain with dorsiflexion noted. No definite sensory deficit or dermatomal dystrophy she was detected. EHL strength appeared to be decreased slightly. The patient had difficulty attempted to stand of tiptoes and heels. There was moderate tenderness of the PSIS and PII S region and mild tenderness of the greater trochanteric region and iliotibial band region There was negative clonus negative Homans. Abdomen was nontender with no costovertebral tenderness noted             Assessment & Plan:     Degenerative disc disease lumbar spine L3 L4 multifactorial moderate to marked spinal stenosis.  L4-L5 bulge with superimposed central disc protrusion slightly greater to the left. Indentation ventral aspect of the thecal sac slightly greater to the left. Facet degenerative changes. L5-S1 mild bulge with osteophyte with foraminal/lateral extension slightly greater on the left. Mild encroachment upon but not compression of the exiting L5 nerve roots. Facet joint degenerative changes.  Lumbar stenosis with neurogenic claudication  Lumbar radiculopathy  Lumbar facet syndrome  Sacroiliac joint dysfunction     PLAN  Continue present medication Neurontin and methadone   Lumbar epidural steroid injection to be performed at time of return appointment  F/U PCP Dr.Aycock  for evaliation of  BP and general medical  condition.  F/U surgical evaluation .Marland Kitchen Please ask receptionist and nurses the date of  your neurosurgical  evaluation  F/U neurological evaluation. May consider PNCV/EMG pending follow-up evaluations  May consider radiofrequency rhizolysis or intraspinal procedures pending response to present treatment and F/U evaluation.  Patient to call [facility] Management Center should patient have concerns prior to scheduled return appointment.

## 2015-05-07 ENCOUNTER — Ambulatory Visit: Payer: Medicare Other | Attending: Pain Medicine | Admitting: Pain Medicine

## 2015-05-07 ENCOUNTER — Encounter: Payer: Self-pay | Admitting: Pain Medicine

## 2015-05-07 VITALS — BP 126/63 | HR 71 | Temp 97.8°F | Resp 16 | Ht 66.0 in | Wt 190.0 lb

## 2015-05-07 DIAGNOSIS — M2578 Osteophyte, vertebrae: Secondary | ICD-10-CM | POA: Diagnosis not present

## 2015-05-07 DIAGNOSIS — M533 Sacrococcygeal disorders, not elsewhere classified: Secondary | ICD-10-CM

## 2015-05-07 DIAGNOSIS — M5136 Other intervertebral disc degeneration, lumbar region: Secondary | ICD-10-CM | POA: Insufficient documentation

## 2015-05-07 DIAGNOSIS — M47814 Spondylosis without myelopathy or radiculopathy, thoracic region: Secondary | ICD-10-CM

## 2015-05-07 DIAGNOSIS — M47816 Spondylosis without myelopathy or radiculopathy, lumbar region: Secondary | ICD-10-CM | POA: Diagnosis not present

## 2015-05-07 DIAGNOSIS — G588 Other specified mononeuropathies: Secondary | ICD-10-CM

## 2015-05-07 DIAGNOSIS — M47894 Other spondylosis, thoracic region: Secondary | ICD-10-CM

## 2015-05-07 DIAGNOSIS — M51369 Other intervertebral disc degeneration, lumbar region without mention of lumbar back pain or lower extremity pain: Secondary | ICD-10-CM

## 2015-05-07 DIAGNOSIS — M4806 Spinal stenosis, lumbar region: Secondary | ICD-10-CM | POA: Insufficient documentation

## 2015-05-07 DIAGNOSIS — M5126 Other intervertebral disc displacement, lumbar region: Secondary | ICD-10-CM | POA: Diagnosis not present

## 2015-05-07 DIAGNOSIS — M5416 Radiculopathy, lumbar region: Secondary | ICD-10-CM

## 2015-05-07 DIAGNOSIS — M5134 Other intervertebral disc degeneration, thoracic region: Secondary | ICD-10-CM

## 2015-05-07 DIAGNOSIS — M79606 Pain in leg, unspecified: Secondary | ICD-10-CM | POA: Diagnosis present

## 2015-05-07 DIAGNOSIS — M48062 Spinal stenosis, lumbar region with neurogenic claudication: Secondary | ICD-10-CM

## 2015-05-07 DIAGNOSIS — M545 Low back pain: Secondary | ICD-10-CM | POA: Diagnosis present

## 2015-05-07 MED ORDER — FENTANYL CITRATE (PF) 100 MCG/2ML IJ SOLN
INTRAMUSCULAR | Status: AC
  Administered 2015-05-07: 100 ug
  Filled 2015-05-07: qty 2

## 2015-05-07 MED ORDER — MIDAZOLAM HCL 5 MG/5ML IJ SOLN
INTRAMUSCULAR | Status: AC
  Administered 2015-05-07: 3 mg via INTRAVENOUS
  Filled 2015-05-07: qty 5

## 2015-05-07 MED ORDER — BUPIVACAINE HCL (PF) 0.25 % IJ SOLN
INTRAMUSCULAR | Status: AC
  Administered 2015-05-07: 10:00:00
  Filled 2015-05-07: qty 30

## 2015-05-07 MED ORDER — LIDOCAINE HCL (PF) 1 % IJ SOLN
INTRAMUSCULAR | Status: AC
  Administered 2015-05-07: 10:00:00
  Filled 2015-05-07: qty 5

## 2015-05-07 MED ORDER — ORPHENADRINE CITRATE 30 MG/ML IJ SOLN: 60.0000 mg | Freq: Once | INTRAMUSCULAR | Status: DC

## 2015-05-07 MED ORDER — TRIAMCINOLONE ACETONIDE 40 MG/ML IJ SUSP
40.0000 mg | Freq: Once | INTRAMUSCULAR | Status: DC
Start: 2015-05-07 — End: 2015-05-28

## 2015-05-07 MED ORDER — BUPIVACAINE HCL (PF) 0.25 % IJ SOLN: 30.0000 mL | Freq: Once | INTRAMUSCULAR | Status: DC

## 2015-05-07 MED ORDER — LIDOCAINE HCL (PF) 1 % IJ SOLN: 10.0000 mL | Freq: Once | INTRAMUSCULAR | Status: DC

## 2015-05-07 MED ORDER — LIDOCAINE HCL (PF) 1 % IJ SOLN
INTRAMUSCULAR | Status: AC
  Administered 2015-05-07: 11:00:00
  Filled 2015-05-07: qty 5

## 2015-05-07 MED ORDER — FENTANYL CITRATE (PF) 100 MCG/2ML IJ SOLN: 100.0000 ug | Freq: Once | INTRAMUSCULAR | Status: DC

## 2015-05-07 MED ORDER — SODIUM CHLORIDE 0.9% FLUSH
20.0000 mL | Freq: Once | INTRAVENOUS | Status: DC
Start: 2015-05-07 — End: 2015-05-28

## 2015-05-07 MED ORDER — LACTATED RINGERS IV SOLN: 1000.0000 mL | INTRAVENOUS | Status: DC

## 2015-05-07 MED ORDER — TRIAMCINOLONE ACETONIDE 40 MG/ML IJ SUSP
INTRAMUSCULAR | Status: AC
  Administered 2015-05-07: 11:00:00
  Filled 2015-05-07: qty 1

## 2015-05-07 MED ORDER — SODIUM CHLORIDE 0.9 % IJ SOLN
INTRAMUSCULAR | Status: AC
  Administered 2015-05-07: 11:00:00
  Filled 2015-05-07: qty 20

## 2015-05-07 MED ORDER — MIDAZOLAM HCL 5 MG/5ML IJ SOLN: 5.0000 mg | Freq: Once | INTRAMUSCULAR | Status: DC

## 2015-05-07 MED ORDER — ORPHENADRINE CITRATE 30 MG/ML IJ SOLN
INTRAMUSCULAR | Status: AC
  Administered 2015-05-07: 11:00:00
  Filled 2015-05-07: qty 2

## 2015-05-07 NOTE — Progress Notes (Signed)
Safety precautions to be maintained throughout the outpatient stay will include: orient to surroundings, keep bed in low position, maintain call bell within reach at all times, provide assistance with transfer out of bed and ambulation.  

## 2015-05-07 NOTE — Progress Notes (Signed)
Subjective:    Patient ID: Cheyenne Gray, female    DOB: 28-Jul-1965, 50 y.o.   MRN: [id number]  HPI  PROCEDURE PERFORMED: Lumbar epidural steroid injection   NOTE: The patient is a 50 y.o. female who returns to [facility] for further evaluation and treatment of pain involving the lumbar and lower extremity region. 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  L4-L5 bulge with superimposed central disc protrusion slightly greater to the left. Indentation ventral aspect of the thecal sac slightly greater to the left. Facet degenerative changes. L5-S1 mild bulge with osteophyte with foraminal/lateral extension slightly greater on the left. Mild encroachment upon but not compression of the exiting L5 nerve roots. Facet joint degenerative changes. There is concern regarding patient's symptoms being due to lumbar radiculopathy and lumbar stenosis. The risks, benefits, and expectations of the procedure have been discussed and explained to the patient who was understanding and in agreement with suggested treatment plan. We will proceed with lumbar epidural steroid injection as discussed and as explained to the patient who is willing to proceed with procedure as planned.   DESCRIPTION OF PROCEDURE: Lumbar epidural steroid injection with IV Versed, IV fentanyl conscious sedation, EKG, blood pressure, pulse, and pulse oximetry monitoring. The procedure was performed with the patient in the prone position under fluoroscopic guidance. A local anesthetic skin wheal of 1.5% plain lidocaine was accomplished at proposed entry site. An 18-gauge Tuohy epidural needle was inserted at the L 4 vertebral body level right of the midline via loss-of-resistance technique with negative heme and negative CSF return. A total of 4 mL of Preservative-Free normal saline with 40 mg of Kenalog injected incrementally via epidurally  placed needle. Needle was removed.  Myoneural block injections of the gluteal musculature region Following Betadine prep of proposed entry site a 22-gauge needle was inserted in the gluteal musculature region and following negative aspiration 2 cc of 0.25% bupivacaine with Norflex was injected for myoneural block injection of the gluteal musculature region times  two  Myoneural block injections of the cervical region Following Betadine prep of proposed entry site a 25-gauge needle was inserted in the cervical paraspinal musculature region and following negative aspiration 2 cc of 0.25% bupivacaine with Norflex was injected for myoneural block injections of the cervical region times two    A total of 40 mg of Kenalog was utilized for the procedure.   The patient tolerated the injection well.    PLAN:   1. Medications: We will continue presently prescribed medications. Neurontin and methadone  2. Will consider modification of treatment regimen pending response to treatment rendered on today's visit and follow-up evaluation. 3. The patient is to follow-up with primary care physician Dr. Letta Pate regarding blood pressure and general medical condition status post lumbar epidural steroid injection performed on today's visit. 4. Surgical evaluation. Has been addressed  5. Neurological evaluation. Has been addressed  6. The patient may be a candidate for radiofrequency procedures, implantation device, and other treatment pending response to treatment and follow-up evaluation. 7. The patient has been advised to adhere to proper body mechanics and avoid activities which appear to aggravate condition. 8. The patient has been advised to call the [facility] Management Center prior to scheduled return appointment should there be significant change in condition or should there be sign  The patient is understanding and agrees with the suggested  treatment plan   Review of Systems  Objective:   Physical  Exam        Assessment & Plan:

## 2015-05-07 NOTE — Patient Instructions (Addendum)
PLAN  Continue present medication Neurontin and methadone and antibiotic  F/U PCP for evaliation of  BP and general medical  condition.  F/U surgical evaluation. May consider pending follow-up evaluations  F/U neurological evaluation. May consider pending follow-up evaluations  May consider radiofrequency rhizolysis or intraspinal procedures pending response to present treatment and F/U evaluation.  Patient to call [facility] Management Center should patient have concerns prior to scheduled return appointment.  Pain Management Discharge Instructions  General Discharge Instructions :  If you need to reach your doctor call: Monday-Friday 8:00 am - 4:00 pm at [phone] or toll free [phone].  After clinic hours [phone] to have operator reach doctor.  Bring all of your medication bottles to all your appointments in the pain clinic.  To cancel or reschedule your appointment with Pain Management please remember to call 24 hours in advance to avoid a fee.  Refer to the educational materials which you have been given on: General Risks, I had my Procedure. Discharge Instructions, Post Sedation.  Post Procedure Instructions:  The drugs you were given will stay in your system until tomorrow, so for the next 24 hours you should not drive, make any legal decisions or drink any alcoholic beverages.  You may eat anything you prefer, but it is better to start with liquids then soups and crackers, and gradually work up to solid foods.  Please notify your doctor immediately if you have any unusual bleeding, trouble breathing or pain that is not related to your normal pain.  Depending on the type of procedure that was done, some parts of your body may feel week and/or numb.  This usually clears up by tonight or the next day.  Walk with the use of an assistive device or accompanied by an adult for the 24 hours.  You may use ice on the affected area for the first 24 hours.  Put ice in a  Ziploc bag and cover with a towel and place against area 15 minutes on 15 minutes off.  You may switch to heat after 24 hours.

## 2015-05-08 ENCOUNTER — Telehealth: Payer: Self-pay | Admitting: *Deleted

## 2015-05-08 NOTE — Telephone Encounter (Signed)
No problems post procedure. 

## 2015-05-08 NOTE — Telephone Encounter (Signed)
Patient asking about surgical consult. Has been a while since Dr. Metta Clines ordered it, she hasn't heard anything.

## 2015-05-14 ENCOUNTER — Other Ambulatory Visit: Payer: Self-pay | Admitting: Pain Medicine

## 2015-05-15 ENCOUNTER — Other Ambulatory Visit: Payer: Self-pay | Admitting: Pain Medicine

## 2015-05-15 ENCOUNTER — Telehealth: Payer: Self-pay | Admitting: Pain Medicine

## 2015-05-15 DIAGNOSIS — M47816 Spondylosis without myelopathy or radiculopathy, lumbar region: Secondary | ICD-10-CM

## 2015-05-15 DIAGNOSIS — M48062 Spinal stenosis, lumbar region with neurogenic claudication: Secondary | ICD-10-CM

## 2015-05-15 DIAGNOSIS — M5416 Radiculopathy, lumbar region: Secondary | ICD-10-CM

## 2015-05-15 DIAGNOSIS — M533 Sacrococcygeal disorders, not elsewhere classified: Secondary | ICD-10-CM

## 2015-05-15 NOTE — Telephone Encounter (Signed)
Spoke with patient and defined exactly what was going on and what she needed.  Spoke with Dr Metta Clines with patient's c/o pain and that she is still waiting to hear from neurosurgical consult.  He has asked that she be added to the schedule on MOnday for SNRB.  Transferred to front desk for scheduling.

## 2015-05-15 NOTE — Telephone Encounter (Signed)
Having back pain into hip and down into right leg, since Sat. Please call with suggestions, just had procedure on 20th

## 2015-05-17 ENCOUNTER — Telehealth: Payer: Self-pay | Admitting: Pain Medicine

## 2015-05-17 NOTE — Telephone Encounter (Signed)
Canceled.

## 2015-05-21 ENCOUNTER — Ambulatory Visit: Payer: Medicare Other | Admitting: Pain Medicine

## 2015-05-22 ENCOUNTER — Encounter: Payer: Self-pay | Admitting: Pain Medicine

## 2015-05-22 ENCOUNTER — Ambulatory Visit: Payer: Medicare Other | Attending: Pain Medicine | Admitting: Pain Medicine

## 2015-05-22 VITALS — BP 146/88 | HR 65 | Temp 97.7°F | Resp 16 | Ht 65.0 in | Wt 190.0 lb

## 2015-05-22 DIAGNOSIS — M542 Cervicalgia: Secondary | ICD-10-CM | POA: Diagnosis present

## 2015-05-22 DIAGNOSIS — M5116 Intervertebral disc disorders with radiculopathy, lumbar region: Secondary | ICD-10-CM | POA: Insufficient documentation

## 2015-05-22 DIAGNOSIS — M533 Sacrococcygeal disorders, not elsewhere classified: Secondary | ICD-10-CM | POA: Insufficient documentation

## 2015-05-22 DIAGNOSIS — M5481 Occipital neuralgia: Secondary | ICD-10-CM | POA: Diagnosis not present

## 2015-05-22 DIAGNOSIS — G43119 Migraine with aura, intractable, without status migrainosus: Secondary | ICD-10-CM

## 2015-05-22 DIAGNOSIS — M47816 Spondylosis without myelopathy or radiculopathy, lumbar region: Secondary | ICD-10-CM

## 2015-05-22 DIAGNOSIS — M5134 Other intervertebral disc degeneration, thoracic region: Secondary | ICD-10-CM

## 2015-05-22 DIAGNOSIS — M5416 Radiculopathy, lumbar region: Secondary | ICD-10-CM

## 2015-05-22 DIAGNOSIS — R51 Headache: Secondary | ICD-10-CM | POA: Insufficient documentation

## 2015-05-22 DIAGNOSIS — M2578 Osteophyte, vertebrae: Secondary | ICD-10-CM | POA: Insufficient documentation

## 2015-05-22 DIAGNOSIS — G588 Other specified mononeuropathies: Secondary | ICD-10-CM

## 2015-05-22 DIAGNOSIS — M47814 Spondylosis without myelopathy or radiculopathy, thoracic region: Secondary | ICD-10-CM

## 2015-05-22 DIAGNOSIS — M4806 Spinal stenosis, lumbar region: Secondary | ICD-10-CM | POA: Diagnosis not present

## 2015-05-22 DIAGNOSIS — G43909 Migraine, unspecified, not intractable, without status migrainosus: Secondary | ICD-10-CM | POA: Insufficient documentation

## 2015-05-22 DIAGNOSIS — M47894 Other spondylosis, thoracic region: Secondary | ICD-10-CM

## 2015-05-22 DIAGNOSIS — M48062 Spinal stenosis, lumbar region with neurogenic claudication: Secondary | ICD-10-CM

## 2015-05-22 DIAGNOSIS — M5126 Other intervertebral disc displacement, lumbar region: Secondary | ICD-10-CM | POA: Insufficient documentation

## 2015-05-22 DIAGNOSIS — M5136 Other intervertebral disc degeneration, lumbar region: Secondary | ICD-10-CM

## 2015-05-22 MED ORDER — METHADONE HCL 10 MG PO TABS: ORAL_TABLET | ORAL | Status: DC

## 2015-05-22 MED ORDER — GABAPENTIN 400 MG PO CAPS: ORAL_CAPSULE | ORAL | Status: DC

## 2015-05-22 NOTE — Progress Notes (Signed)
Subjective:    Patient ID: Cheyenne Gray, female    DOB: January 19, 1966, 50 y.o.   MRN: [id number]  HPI  The patient is a 50 year old female who returns to pain management for further evaluation and treatment of pain involving the neck with pain of the neck radiating to the back of the hip precipitating headaches. On today's visit the patient stated that she had migraine headache as well. She stated that she continues to have lower back and lower extremity pain. The patient wished to undergo treatment for her migraine headache at time of return appointment instead of treatment for lower back lower extremity pain and the patient was asked which area she wishes to have treated first. The patient denied any trauma change in events of daily living the call significant change in symptomatology. The patient stated that her headaches usually occur on the right side. The patient stated that this was her usual migraine. We'll offer to proceed with sphenopalatine ganglion block on the following day and decision was made to schedule patient once patient had her schedule arranged. We informed patient that we would hope to decrease severity of her migraine headache as well as the frequency of her headaches by performing the sphenopalatine ganglion block. The patient was with understanding and agreed to suggested treatment plan     Review of Systems     Objective:   Physical Exam There was tenderness of the splenius capitis and occipitalis musculature region a moderate degree. No new masses of the head and neck were noted. There appeared to be ptosis of the right eyelid compared to the left eyelid. There were no new masses of the head and neck noted were no bounding pulsations of the temporal region noted. Palpation over the cervical facet cervical paraspinal muscular region was with moderate tenderness to palpation. The patient appeared to be unremarkable Spurling's maneuver. Palpation of the acromioclavicular  and glenohumeral joint regions were without increased pain of severe degree. The patient appeared to be with bilaterally equal grip strength and Tinel and Phalen's maneuver were without increased pain of significant degree. Palpation over the lumbar paraspinal muscles lumbar facet region was with moderate discomfort to moderately severe discomfort with lateral bending rotation extension and palpation over the lumbar facets reproducing moderately severe discomfort. Straight leg raising was limited to approximately 20 without a definite increase of pain with dorsiflexion noted. There was negative clonus negative Homans. Mild tenderness to palpation over the PSIS and PII S regions noted. There was mild tenderness along the greater trochanteric region and iliotibial band region. Tender with no costovertebral tenderness noted.       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  L4-L5 bulge with superimposed central disc protrusion slightly greater to the left. Indentation ventral aspect of the thecal sac slightly greater to the left. Facet degenerative changes. L5-S1 mild bulge with osteophyte with foraminal/lateral extension slightly greater on the left. Mild encroachment upon but not compression of the exiting L5 nerve roots. Facet joint degenerative changes.  Lumbar stenosis with neurogenic claudication  Lumbar radiculopathy  Lumbar facet syndrome  Sacroiliac joint dysfunction      PLAN  Continue present medication Neurontin and methadone  Sphenopalatine ganglion block to be performed at time of return appointment  F/U PCP Dr.Aycock  for evaliation of  BP fatigue and general medical  condition.  F/U surgical evaluation .Marland Kitchen Please ask receptionist  and nurses the date of your neurosurgical evaluation  F/U neurological evaluation. May consider PNCV/EMG pending  follow-up evaluations  May consider radiofrequency rhizolysis or intraspinal procedures pending response to present treatment and F/U evaluation.  Patient to call Pain Management Center should patient have concerns prior to scheduled return appointment

## 2015-05-22 NOTE — Progress Notes (Signed)
Safety precautions to be maintained throughout the outpatient stay will include: orient to surroundings, keep bed in low position, maintain call bell within reach at all times, provide assistance with transfer out of bed and ambulation.  

## 2015-05-22 NOTE — Patient Instructions (Addendum)
PLAN  Continue present medication Neurontin and methadone  Sphenopalatine ganglion block to be performed at time of return appointment  F/U PCP Dr.Aycock  for evaliation of  BP fatigue and general medical  condition.  F/U surgical evaluation .Marland Kitchen. Please ask receptionist and nurses the date of your neurosurgical evaluation  F/U neurological evaluation. May consider PNCV/EMG pending follow-up evaluations  May consider radiofrequency rhizolysis or intraspinal procedures pending response to present treatment and F/U evaluation.  Patient to call Pain Management Center should patient have concerns prior to scheduled return appointment

## 2015-05-28 ENCOUNTER — Ambulatory Visit: Payer: Medicare Other | Attending: Pain Medicine | Admitting: Pain Medicine

## 2015-05-28 ENCOUNTER — Encounter: Payer: Self-pay | Admitting: Pain Medicine

## 2015-05-28 VITALS — BP 126/83 | HR 64 | Temp 98.0°F | Resp 14 | Ht 64.0 in | Wt 190.0 lb

## 2015-05-28 DIAGNOSIS — M533 Sacrococcygeal disorders, not elsewhere classified: Secondary | ICD-10-CM

## 2015-05-28 DIAGNOSIS — M5481 Occipital neuralgia: Secondary | ICD-10-CM

## 2015-05-28 DIAGNOSIS — R51 Headache: Secondary | ICD-10-CM | POA: Diagnosis present

## 2015-05-28 DIAGNOSIS — G588 Other specified mononeuropathies: Secondary | ICD-10-CM

## 2015-05-28 DIAGNOSIS — M47816 Spondylosis without myelopathy or radiculopathy, lumbar region: Secondary | ICD-10-CM

## 2015-05-28 DIAGNOSIS — M5416 Radiculopathy, lumbar region: Secondary | ICD-10-CM

## 2015-05-28 DIAGNOSIS — M5136 Other intervertebral disc degeneration, lumbar region: Secondary | ICD-10-CM

## 2015-05-28 DIAGNOSIS — M5134 Other intervertebral disc degeneration, thoracic region: Secondary | ICD-10-CM

## 2015-05-28 DIAGNOSIS — M47894 Other spondylosis, thoracic region: Secondary | ICD-10-CM

## 2015-05-28 DIAGNOSIS — M48062 Spinal stenosis, lumbar region with neurogenic claudication: Secondary | ICD-10-CM

## 2015-05-28 DIAGNOSIS — G43119 Migraine with aura, intractable, without status migrainosus: Secondary | ICD-10-CM

## 2015-05-28 DIAGNOSIS — M47814 Spondylosis without myelopathy or radiculopathy, thoracic region: Secondary | ICD-10-CM

## 2015-05-28 MED ORDER — LIDOCAINE HCL 4 % EX SOLN
CUTANEOUS | Status: AC
  Administered 2015-05-28: 11:00:00 via NASAL
  Filled 2015-05-28: qty 50

## 2015-05-28 NOTE — Progress Notes (Signed)
Subjective:    Patient ID: Cheyenne Gray, female    DOB: 01-07-1966, 50 y.o.   MRN: [id number]  HPI                                                                                                               SPHENOPALATINE GANGLION BLOCK                                                                        The patient is a 50 -year-old female who comes to pain management for follow-up evaluation and treatment of headache. The patient has been felt to be with significant component of headaches due to migraine. Decision has been made to proceed with sphenopalatine ganglion block in attempt to decrease severity of patient's headaches, reduce the frequency of patient's headaches, and avoid the need for more extensive treatment. The risks, benefits, and expectations of the procedure have been discussed with patient and explained to patient and all are in agreement to proceed with sphenopalatine ganglion block as planned   Description Of Procedure:  Sphenopalatine Ganglion Block  The patient assumed the supine position with head hyperextended. EKG, blood pressure, pulse, and pulse oximetry monitors were all in place. Next 1 cc of 4% topical lidocaine was instilled in the right nostril followed by 1 cc of 4% lidocaine instilled in the left nostril while patient remained in the supine position with the head hyperextended The patient tolerated the administration of medication very well A cotton pledget soaked with 1 cc of 4% lidocaine was placed in the right nostril followed by a cotton pledget soaked with 1 cc of 4% lidocaine placed in the left nostril. The patient remained in the supine position with cotton pledgets in place for 35 minutes  The patient tolerated the procedure well  The patient stated that the headache as slightly improved following completion of procedure We will request approval for greater occipital nerve block to be performed at time of return appointment There is concern  regarding significant component of headaches being due to greater occipital neuralgia as well.    PLAN   Continue present medications Neurontin and methadone  Greater  occipital nerve block to be performed at time of return appointment  F/U PCP  Dr.Aycockr evaliation of  BP and general medical  condition  F/U surgical evaluation. May consider pending further evaluation  F/U neurological evaluation for further assessment and treatment of headaches has been addressed  May consider radiofrequency rhizolysis or intraspinal procedures pending response to present treatment and F/U evaluation   Patient to call Pain Management Center should patient have concerns prior to scheduled return appointment.     Review of Systems     Objective:   Physical Exam  Assessment & Plan:

## 2015-05-28 NOTE — Patient Instructions (Addendum)
PLAN  Continue present medication Neurontin and methadone  Greater occipital nerve block to be performed at time of return appointment  F/U PCP Dr.Aycock  for evaliation of  BP and general medical  condition.  F/U surgical evaluation . Neurosurgical evaluation as discussed  F/U neurological evaluation. May consider PNCV/EMG pending follow-up evaluations  May consider radiofrequency rhizolysis or intraspinal procedures pending response to present treatment and F/U evaluation.  Patient to call Pain Management Center should patient have concerns prior to scheduled return appointment  Occipital Nerve Block Patient Information  Description: The occipital nerves originate in the cervical (neck) spinal cord and travel upward through muscle and tissue to supply sensation to the back of the head and top of the scalp.  In addition, the nerves control some of the muscles of the scalp.  Occipital neuralgia is an irritation of these nerves which can cause headaches, numbness of the scalp, and neck discomfort.     The occipital nerve block will interrupt nerve transmission through these nerves and can relieve pain and spasm.  The block consists of insertion of a small needle under the skin in the back of the head to deposit local anesthetic (numbing medicine) and/or steroids around the nerve.  The entire block usually lasts less than 5 minutes.  Conditions which may be treated by occipital blocks:   Muscular pain and spasm of the scalp  Nerve irritation, back of the head  Headaches  Upper neck pain  Preparation for the injection:  1. Do not eat any solid food or dairy products within 8 hours of your appointment. 2. You may drink clear liquids up to 3 hours before appointment.  Clear liquids include water, black coffee, juice or soda.  No milk or cream please. 3. You may take your regular medication, including pain medications, with a sip of water before you appointment.  Diabetics should hold  regular insulin (if taken separately) and take 1/2 normal NPH dose the morning of the procedure.  Carry some sugar containing items with you to your appointment. 4. A driver must accompany you and be prepared to drive you home after your procedure. 5. Bring all your current medications with you. 6. An IV may be inserted and sedation may be given at the discretion of the physician. 7. A blood pressure cuff, EKG, and other monitors will often be applied during the procedure.  Some patients may need to have extra oxygen administered for a short period. 8. You will be asked to provide medical information, including your allergies and medications, prior to the procedure.  We must know immediately if you are taking blood thinners (like Coumadin/Warfarin) or if you are allergic to IV iodine contrast (dye).  We must know if you could possible be pregnant.  9. Do not wear a high collared shirt or turtleneck.  Tie long hair up in the back if possible.  Possible side-effects:   Bleeding from needle site  Infection (rare, may require surgery)  Nerve injury (rare)  Hair on back of neck can be tinged with iodine scrub (this will wash out)  Light-headedness (temporary)  Pain at injection site (several days)  Decreased blood pressure (rare, temporary)  Seizure (very rare)  Call if you experience:   Hives or difficulty breathing ( go to the emergency room)  Inflammation or drainage at the injection site(s)  Please note:  Although the local anesthetic injected can often make your painful muscles or headache feel good for several hours after the injection, the  pain may return.  It takes 3-7 days for steroids to work.  You may not notice any pain relief for at least one week.  If effective, we will often do a series of injections spaced 3-6 weeks apart to maximally decrease your pain.  If you have any questions, please call [phone] [facility]

## 2015-05-29 ENCOUNTER — Inpatient Hospital Stay: Payer: Medicare Other

## 2015-05-29 ENCOUNTER — Telehealth: Payer: Self-pay | Admitting: *Deleted

## 2015-05-29 ENCOUNTER — Inpatient Hospital Stay: Payer: Medicare Other | Attending: Family Medicine | Admitting: *Deleted

## 2015-05-29 DIAGNOSIS — D509 Iron deficiency anemia, unspecified: Secondary | ICD-10-CM | POA: Insufficient documentation

## 2015-05-29 DIAGNOSIS — R5382 Chronic fatigue, unspecified: Secondary | ICD-10-CM

## 2015-05-29 LAB — IRON AND TIBC
IRON: 89 ug/dL (ref 28–170)
SATURATION RATIOS: 22 % (ref 10.4–31.8)
TIBC: 409 ug/dL (ref 250–450)
UIBC: 320 ug/dL

## 2015-05-29 LAB — HEMOGLOBIN: HEMOGLOBIN: 12.9 g/dL (ref 12.0–16.0)

## 2015-05-29 LAB — FERRITIN: FERRITIN: 29 ng/mL (ref 11–307)

## 2015-05-29 NOTE — Progress Notes (Unsigned)
Patient here for labs and Venofer, if needed.  Hemoglobin was normal, but ferritin would not result for an hour or more.  Spoke with MD on call, Dr. Orlie DakinFinnegan, who said to have her go home, await ferritin results, and she would be contacted if she needed to reschedule her Venofer.  Emailed triage nurse and asked that she follow up with the patient tomorrow regarding results.  LJ

## 2015-05-29 NOTE — Telephone Encounter (Signed)
Spoke with patient, verbalizes no complications from procedure.  States she continues to have the headache that she was having prior to procedure.

## 2015-05-30 ENCOUNTER — Telehealth: Payer: Self-pay | Admitting: *Deleted

## 2015-05-30 NOTE — Telephone Encounter (Signed)
Per Dr Orlie DakinFinnegan patient does not need Iron infusion, called patient and got VM left message to call me back and that she does not need Iron

## 2015-06-04 ENCOUNTER — Encounter: Payer: Self-pay | Admitting: Pain Medicine

## 2015-06-04 ENCOUNTER — Telehealth: Payer: Self-pay | Admitting: *Deleted

## 2015-06-04 ENCOUNTER — Ambulatory Visit: Payer: Medicare Other | Attending: Pain Medicine | Admitting: Pain Medicine

## 2015-06-04 VITALS — BP 162/96 | HR 63 | Temp 97.3°F | Resp 15 | Ht 65.0 in | Wt 215.0 lb

## 2015-06-04 DIAGNOSIS — M47816 Spondylosis without myelopathy or radiculopathy, lumbar region: Secondary | ICD-10-CM

## 2015-06-04 DIAGNOSIS — M47814 Spondylosis without myelopathy or radiculopathy, thoracic region: Secondary | ICD-10-CM

## 2015-06-04 DIAGNOSIS — M5481 Occipital neuralgia: Secondary | ICD-10-CM

## 2015-06-04 DIAGNOSIS — R51 Headache: Secondary | ICD-10-CM | POA: Diagnosis present

## 2015-06-04 DIAGNOSIS — M542 Cervicalgia: Secondary | ICD-10-CM | POA: Diagnosis present

## 2015-06-04 DIAGNOSIS — M51369 Other intervertebral disc degeneration, lumbar region without mention of lumbar back pain or lower extremity pain: Secondary | ICD-10-CM

## 2015-06-04 DIAGNOSIS — M533 Sacrococcygeal disorders, not elsewhere classified: Secondary | ICD-10-CM

## 2015-06-04 DIAGNOSIS — M48062 Spinal stenosis, lumbar region with neurogenic claudication: Secondary | ICD-10-CM

## 2015-06-04 DIAGNOSIS — M5030 Other cervical disc degeneration, unspecified cervical region: Secondary | ICD-10-CM | POA: Diagnosis not present

## 2015-06-04 DIAGNOSIS — G588 Other specified mononeuropathies: Secondary | ICD-10-CM

## 2015-06-04 DIAGNOSIS — G43119 Migraine with aura, intractable, without status migrainosus: Secondary | ICD-10-CM

## 2015-06-04 DIAGNOSIS — M5416 Radiculopathy, lumbar region: Secondary | ICD-10-CM

## 2015-06-04 DIAGNOSIS — G43109 Migraine with aura, not intractable, without status migrainosus: Secondary | ICD-10-CM

## 2015-06-04 DIAGNOSIS — G43909 Migraine, unspecified, not intractable, without status migrainosus: Secondary | ICD-10-CM | POA: Diagnosis not present

## 2015-06-04 DIAGNOSIS — M5134 Other intervertebral disc degeneration, thoracic region: Secondary | ICD-10-CM

## 2015-06-04 DIAGNOSIS — M47894 Other spondylosis, thoracic region: Secondary | ICD-10-CM

## 2015-06-04 DIAGNOSIS — M5136 Other intervertebral disc degeneration, lumbar region: Secondary | ICD-10-CM

## 2015-06-04 MED ORDER — ORPHENADRINE CITRATE 30 MG/ML IJ SOLN
INTRAMUSCULAR | Status: AC
  Filled 2015-06-04: qty 2

## 2015-06-04 MED ORDER — MIDAZOLAM HCL 5 MG/5ML IJ SOLN: 5.0000 mg | Freq: Once | INTRAMUSCULAR | Status: DC

## 2015-06-04 MED ORDER — ORPHENADRINE CITRATE 30 MG/ML IJ SOLN: 60.0000 mg | Freq: Once | INTRAMUSCULAR | Status: DC

## 2015-06-04 MED ORDER — MIDAZOLAM HCL 5 MG/5ML IJ SOLN
INTRAMUSCULAR | Status: AC
  Administered 2015-06-04: 12:00:00 via INTRAVENOUS
  Filled 2015-06-04: qty 5

## 2015-06-04 MED ORDER — FENTANYL CITRATE (PF) 100 MCG/2ML IJ SOLN
INTRAMUSCULAR | Status: AC
  Administered 2015-06-04: 100 ug via INTRAVENOUS
  Filled 2015-06-04: qty 2

## 2015-06-04 MED ORDER — TRIAMCINOLONE ACETONIDE 40 MG/ML IJ SUSP
INTRAMUSCULAR | Status: AC
  Administered 2015-06-04: 12:00:00
  Filled 2015-06-04: qty 1

## 2015-06-04 MED ORDER — METHADONE HCL 10 MG PO TABS: ORAL_TABLET | ORAL | Status: DC

## 2015-06-04 MED ORDER — BUPIVACAINE HCL (PF) 0.25 % IJ SOLN: 30.0000 mL | Freq: Once | INTRAMUSCULAR | Status: DC

## 2015-06-04 MED ORDER — LACTATED RINGERS IV SOLN: 1000.0000 mL | INTRAVENOUS | Status: DC

## 2015-06-04 MED ORDER — BUPIVACAINE HCL (PF) 0.25 % IJ SOLN
INTRAMUSCULAR | Status: AC
  Administered 2015-06-04: 12:00:00
  Filled 2015-06-04: qty 30

## 2015-06-04 MED ORDER — FENTANYL CITRATE (PF) 100 MCG/2ML IJ SOLN: 100.0000 ug | Freq: Once | INTRAMUSCULAR | Status: DC

## 2015-06-04 MED ORDER — GABAPENTIN 400 MG PO CAPS: ORAL_CAPSULE | ORAL | Status: DC

## 2015-06-04 MED ORDER — TRIAMCINOLONE ACETONIDE 40 MG/ML IJ SUSP: 40.0000 mg | Freq: Once | INTRAMUSCULAR | Status: DC

## 2015-06-04 NOTE — Patient Instructions (Addendum)
PLAN  Continue present medications  Neurontin and methadone  F/U PCP Dr.Aycock  for evaliation of  BP fatigue and general medical  condition.  F/U surgical evaluation .Marland Kitchen. Please ask receptionist and nurses the date of your neurosurgical evaluation  F/U neurological evaluation. May consider PNCV/EMG pending follow-up evaluations  May consider radiofrequency rhizolysis or intraspinal procedures pending response to present treatment and F/U evaluation.  Patient is to call pain management prior to scheduled return appointment should there be change in condition or should patient have other concerns regarding condition prior to scheduled return appointmentGENERAL RISKS AND COMPLICATIONS  What are the risk, side effects and possible complications? Generally speaking, most procedures are safe.  However, with any procedure there are risks, side effects, and the possibility of complications.  The risks and complications are dependent upon the sites that are lesioned, or the type of nerve block to be performed.  The closer the procedure is to the spine, the more serious the risks are.  Great care is taken when placing the radio frequency needles, block needles or lesioning probes, but sometimes complications can occur. 1. Infection: Any time there is an injection through the skin, there is a risk of infection.  This is why sterile conditions are used for these blocks.  There are four possible types of infection. 1. Localized skin infection. 2. Central Nervous System Infection-This can be in the form of Meningitis, which can be deadly. 3. Epidural Infections-This can be in the form of an epidural abscess, which can cause pressure inside of the spine, causing compression of the spinal cord with subsequent paralysis. This would require an emergency surgery to decompress, and there are no guarantees that the patient would recover from the paralysis. 4. Discitis-This is an infection of the intervertebral discs.   It occurs in about 1% of discography procedures.  It is difficult to treat and it may lead to surgery.        2. Pain: the needles have to go through skin and soft tissues, will cause soreness.       3. Damage to internal structures:  The nerves to be lesioned may be near blood vessels or    other nerves which can be potentially damaged.       4. Bleeding: Bleeding is more common if the patient is taking blood thinners such as  aspirin, Coumadin, Ticiid, Plavix, etc., or if he/she have some genetic predisposition  such as hemophilia. Bleeding into the spinal canal can cause compression of the spinal  cord with subsequent paralysis.  This would require an emergency surgery to  decompress and there are no guarantees that the patient would recover from the  paralysis.       5. Pneumothorax:  Puncturing of a lung is a possibility, every time a needle is introduced in  the area of the chest or upper back.  Pneumothorax refers to free air around the  collapsed lung(s), inside of the thoracic cavity (chest cavity).  Another two possible  complications related to a similar event would include: Hemothorax and Chylothorax.   These are variations of the Pneumothorax, where instead of air around the collapsed  lung(s), you may have blood or chyle, respectively.       6. Spinal headaches: They may occur with any procedures in the area of the spine.       7. Persistent CSF (Cerebro-Spinal Fluid) leakage: This is a rare problem, but may occur  with prolonged intrathecal or epidural catheters either due  to the formation of a fistulous  track or a dural tear.       8. Nerve damage: By working so close to the spinal cord, there is always a possibility of  nerve damage, which could be as serious as a permanent spinal cord injury with  paralysis.       9. Death:  Although rare, severe deadly allergic reactions known as "Anaphylactic  reaction" can occur to any of the medications used.      10. Worsening of the symptoms:  We  can always make thing worse.  What are the chances of something like this happening? Chances of any of this occuring are extremely low.  By statistics, you have more of a chance of getting killed in a motor vehicle accident: while driving to the hospital than any of the above occurring .  Nevertheless, you should be aware that they are possibilities.  In general, it is similar to taking a shower.  Everybody knows that you can slip, hit your head and get killed.  Does that mean that you should not shower again?  Nevertheless always keep in mind that statistics do not mean anything if you happen to be on the wrong side of them.  Even if a procedure has a 1 (one) in a 1,000,000 (million) chance of going wrong, it you happen to be that one..Also, keep in mind that by statistics, you have more of a chance of having something go wrong when taking medications.  Who should not have this procedure? If you are on a blood thinning medication (e.g. Coumadin, Plavix, see list of "Blood Thinners"), or if you have an active infection going on, you should not have the procedure.  If you are taking any blood thinners, please inform your physician.  How should I prepare for this procedure?  Do not eat or drink anything at least six hours prior to the procedure.  Bring a driver with you .  It cannot be a taxi.  Come accompanied by an adult that can drive you back, and that is strong enough to help you if your legs get weak or numb from the local anesthetic.  Take all of your medicines the morning of the procedure with just enough water to swallow them.  If you have diabetes, make sure that you are scheduled to have your procedure done first thing in the morning, whenever possible.  If you have diabetes, take only half of your insulin dose and notify our nurse that you have done so as soon as you arrive at the clinic.  If you are diabetic, but only take blood sugar pills (oral hypoglycemic), then do not take them  on the morning of your procedure.  You may take them after you have had the procedure.  Do not take aspirin or any aspirin-containing medications, at least eleven (11) days prior to the procedure.  They may prolong bleeding.  Wear loose fitting clothing that may be easy to take off and that you would not mind if it got stained with Betadine or blood.  Do not wear any jewelry or perfume  Remove any nail coloring.  It will interfere with some of our monitoring equipment.  NOTE: Remember that this is not meant to be interpreted as a complete list of all possible complications.  Unforeseen problems may occur.  BLOOD THINNERS The following drugs contain aspirin or other products, which can cause increased bleeding during surgery and should not be taken for 2 weeks  prior to and 1 week after surgery.  If you should need take something for relief of minor pain, you may take acetaminophen which is found in Tylenol,m Datril, Anacin-3 and Panadol. It is not blood thinner. The products listed below are.  Do not take any of the products listed below in addition to any listed on your instruction sheet.  A.P.C or A.P.C with Codeine Codeine Phosphate Capsules #3 Ibuprofen Ridaura  ABC compound Congesprin Imuran rimadil  Advil Cope Indocin Robaxisal  Alka-Seltzer Effervescent Pain Reliever and Antacid Coricidin or Coricidin-D  Indomethacin Rufen  Alka-Seltzer plus Cold Medicine Cosprin Ketoprofen S-A-C Tablets  Anacin Analgesic Tablets or Capsules Coumadin Korlgesic Salflex  Anacin Extra Strength Analgesic tablets or capsules CP-2 Tablets Lanoril Salicylate  Anaprox Cuprimine Capsules Levenox Salocol  Anexsia-D Dalteparin Magan Salsalate  Anodynos Darvon compound Magnesium Salicylate Sine-off  Ansaid Dasin Capsules Magsal Sodium Salicylate  Anturane Depen Capsules Marnal Soma  APF Arthritis pain formula Dewitt's Pills Measurin Stanback  Argesic Dia-Gesic Meclofenamic Sulfinpyrazone  Arthritis Bayer  Timed Release Aspirin Diclofenac Meclomen Sulindac  Arthritis pain formula Anacin Dicumarol Medipren Supac  Analgesic (Safety coated) Arthralgen Diffunasal Mefanamic Suprofen  Arthritis Strength Bufferin Dihydrocodeine Mepro Compound Suprol  Arthropan liquid Dopirydamole Methcarbomol with Aspirin Synalgos  ASA tablets/Enseals Disalcid Micrainin Tagament  Ascriptin Doan's Midol Talwin  Ascriptin A/D Dolene Mobidin Tanderil  Ascriptin Extra Strength Dolobid Moblgesic Ticlid  Ascriptin with Codeine Doloprin or Doloprin with Codeine Momentum Tolectin  Asperbuf Duoprin Mono-gesic Trendar  Aspergum Duradyne Motrin or Motrin IB Triminicin  Aspirin plain, buffered or enteric coated Durasal Myochrisine Trigesic  Aspirin Suppositories Easprin Nalfon Trillsate  Aspirin with Codeine Ecotrin Regular or Extra Strength Naprosyn Uracel  Atromid-S Efficin Naproxen Ursinus  Auranofin Capsules Elmiron Neocylate Vanquish  Axotal Emagrin Norgesic Verin  Azathioprine Empirin or Empirin with Codeine Normiflo Vitamin E  Azolid Emprazil Nuprin Voltaren  Bayer Aspirin plain, buffered or children's or timed BC Tablets or powders Encaprin Orgaran Warfarin Sodium  Buff-a-Comp Enoxaparin Orudis Zorpin  Buff-a-Comp with Codeine Equegesic Os-Cal-Gesic   Buffaprin Excedrin plain, buffered or Extra Strength Oxalid   Bufferin Arthritis Strength Feldene Oxphenbutazone   Bufferin plain or Extra Strength Feldene Capsules Oxycodone with Aspirin   Bufferin with Codeine Fenoprofen Fenoprofen Pabalate or Pabalate-SF   Buffets II Flogesic Panagesic   Buffinol plain or Extra Strength Florinal or Florinal with Codeine Panwarfarin   Buf-Tabs Flurbiprofen Penicillamine   Butalbital Compound Four-way cold tablets Penicillin   Butazolidin Fragmin Pepto-Bismol   Carbenicillin Geminisyn Percodan   Carna Arthritis Reliever Geopen Persantine   Carprofen Gold's salt Persistin   Chloramphenicol Goody's Phenylbutazone   Chloromycetin  [place] Piroxlcam   Clmetidine heparin Plaquenil   Cllnoril Hyco-pap Ponstel   Clofibrate Hydroxy chloroquine Propoxyphen         Before stopping any of these medications, be sure to consult the physician who ordered them.  Some, such as Coumadin (Warfarin) are ordered to prevent or treat serious conditions such as "deep thrombosis", "pumonary embolisms", and other heart problems.  The amount of time that you may need off of the medication may also vary with the medication and the reason for which you were taking it.  If you are taking any of these medications, please make sure you notify your pain physician before you undergo any procedures.

## 2015-06-04 NOTE — Progress Notes (Signed)
   Subjective:    Patient ID: Altamese DillingJodie M Miramontes, female    DOB: 05-30-1965, 50 y.o.   MRN: [id number]  HPI . NOTE: The patient is a 50 y.o.-year-old female who [facility] for further evaluation and treatment of pain consisting of pain involving the region of the neck and headache.  Patient is with prior studies revealing patient to be with Degenerative changes of the cervical spine. The patient has history of headaches including migraine headache as well as concern regarding component of greater occipital neuralgia. .  The risks, benefits, and expectations of the procedure have been discussed and explained to patient, who is understanding and wishes to proceed with interventional treatment as discussed and as explained to patient.  Will proceed with greater occipital nerve blocks with myoneural block injections at this time as discussed and as explained to patient.  All are understanding and in agreement with suggested treatment plan.    PROCEDURE:  Greater occipital nerve block on the left side with IV Versed, IV Fentanyl, conscious sedation, EKG, blood pressure, pulse, pulse oximetry monitoring.  Procedure performed with patient in prone position.  Greater occipital nerve block on the left side.   With patient in prone position, Betadine prep of proposed entry site accomplished.  Following identification of the nuchal ridge, 22 -gauge needle was inserted at the level of the nuchal ridge medial to the occipital artery.  Following negative aspiration, 4cc 0.25% bupivacaine with Kenalog injected for left greater occipital nerve block.  Needle was removed.  Patient tolerated injection well.   Greater occipital nerve block on the rightt side. The greater occipital nerve block on the right side was performed exactly as the left greater occipital nerve block was performed and utilizing the same technique.  Myoneural block injections of the cervical musculature region Following  Betadine prep of proposed entry site a 22-gauge needle was inserted in the cervical musculature region and following negative aspiration 2 cc of 0.25% bupivacaine with Norflex was injected for myoneural block injection of the cervical region 4  The patient tolerated the procedure well   A total of 10 mg Kenalog was utilized for the entire procedure.  PLAN:    1. Medications: Will continue presently prescribed medications at this time consisting of Neurontin and oxycodone 2. [facility]  for evaluation of blood pressure and general medical condition status post procedure performed on today's visit. 3. Neurological evaluation for further assessment of headaches for further studies as discussed. 4. Surgical evaluation as discussed.  5. Patient may be candidate for Botox injections, radiofrequency procedures, as well as implantation type procedures pending response to treatment rendered on today's visit and pending follow-up evaluation. 6. Patient has been advised to adhere to proper body mechanics and to avoid activities which appear to aggravate condition.cations:  Will continue presently prescribed medications at this time. 7. The patient is understanding and in agreement with the suggested treatment plan.   Review of Systems     Objective:   Physical Exam        Assessment & Plan:

## 2015-06-04 NOTE — Progress Notes (Signed)
Safety precautions to be maintained throughout the outpatient stay will include: orient to surroundings, keep bed in low position, maintain call bell within reach at all times, provide assistance with transfer out of bed and ambulation.  

## 2015-06-04 NOTE — Telephone Encounter (Signed)
Called pt informed her we had a methadone script here for her . Had to leave a message on her  Phone.

## 2015-06-05 ENCOUNTER — Emergency Department: Payer: Medicare Other

## 2015-06-05 ENCOUNTER — Telehealth: Payer: Self-pay | Admitting: *Deleted

## 2015-06-05 ENCOUNTER — Encounter: Payer: Self-pay | Admitting: *Deleted

## 2015-06-05 DIAGNOSIS — Z79899 Other long term (current) drug therapy: Secondary | ICD-10-CM | POA: Diagnosis not present

## 2015-06-05 DIAGNOSIS — S6991XA Unspecified injury of right wrist, hand and finger(s), initial encounter: Secondary | ICD-10-CM | POA: Insufficient documentation

## 2015-06-05 DIAGNOSIS — Y9289 Other specified places as the place of occurrence of the external cause: Secondary | ICD-10-CM | POA: Diagnosis not present

## 2015-06-05 DIAGNOSIS — Z880 Allergy status to penicillin: Secondary | ICD-10-CM | POA: Diagnosis not present

## 2015-06-05 DIAGNOSIS — W010XXA Fall on same level from slipping, tripping and stumbling without subsequent striking against object, initial encounter: Secondary | ICD-10-CM | POA: Diagnosis not present

## 2015-06-05 DIAGNOSIS — Y9301 Activity, walking, marching and hiking: Secondary | ICD-10-CM | POA: Insufficient documentation

## 2015-06-05 DIAGNOSIS — Y998 Other external cause status: Secondary | ICD-10-CM | POA: Insufficient documentation

## 2015-06-05 NOTE — ED Notes (Signed)
Pt has right wrist pain after falling on pavement.  Pt was walking on loose gravel and pt fell onto her right side.  Redness noted to right side of face and nose.  No loc  No vomiting.  Pt alert.  speech clear.

## 2015-06-05 NOTE — Telephone Encounter (Signed)
Voicemail left for patient to call our office if there are any questions or concerns re; procedure on yesterday. 

## 2015-06-06 ENCOUNTER — Emergency Department
Admission: EM | Admit: 2015-06-06 | Discharge: 2015-06-06 | Disposition: A | Discharge status: Home / Self Care | Payer: Medicare Other | Attending: Emergency Medicine | Admitting: Emergency Medicine

## 2015-06-06 ENCOUNTER — Encounter: Payer: Self-pay | Admitting: Emergency Medicine

## 2015-06-06 DIAGNOSIS — W19XXXA Unspecified fall, initial encounter: Secondary | ICD-10-CM

## 2015-06-06 DIAGNOSIS — M25531 Pain in right wrist: Secondary | ICD-10-CM

## 2015-06-06 HISTORY — DX: Opioid use, unspecified, uncomplicated: F11.90

## 2015-06-06 HISTORY — DX: Opioid dependence, uncomplicated: F11.20

## 2015-06-06 MED ORDER — IBUPROFEN 800 MG PO TABS
800.0000 mg | ORAL_TABLET | Freq: Once | ORAL | Status: AC
  Administered 2015-06-06: 800 mg via ORAL

## 2015-06-06 MED ORDER — OXYCODONE-ACETAMINOPHEN 5-325 MG PO TABS: 1.0000 | ORAL_TABLET | ORAL | Status: DC | PRN

## 2015-06-06 MED ORDER — IBUPROFEN 800 MG PO TABS
ORAL_TABLET | ORAL | Status: AC
  Administered 2015-06-06: 800 mg via ORAL
  Filled 2015-06-06: qty 1

## 2015-06-06 MED ORDER — OXYCODONE-ACETAMINOPHEN 5-325 MG PO TABS
1.0000 | ORAL_TABLET | Freq: Once | ORAL | Status: AC
  Administered 2015-06-06: 1 via ORAL
  Filled 2015-06-06: qty 1

## 2015-06-06 NOTE — ED Notes (Signed)
Discharge instructions reviewed with patient. Patient verbalized understanding. Patient ambulated to lobby without difficulty.   

## 2015-06-06 NOTE — Discharge Instructions (Signed)
1. Take pain medicine as needed (Percocet #15). 2. Elevate affected and apply ice over splint several times daily. 3. Return to the ER for worsening symptoms, swelling, numbness/tingling or other concerns.   Wrist Pain There are many things that can cause wrist pain. Some common causes include:  An injury to the wrist area, such as a sprain, strain, or fracture.  Overuse of the joint.  A condition that causes increased pressure on a nerve in the wrist (carpal tunnel syndrome).  Wear and tear of the joints that occurs with aging (osteoarthritis).  A variety of other types of arthritis. Sometimes, the cause of wrist pain is not known. The pain often goes away when you follow your health care provider's instructions for relieving pain at home. If your wrist pain continues, tests may need to be done to diagnose your condition. HOME CARE INSTRUCTIONS Pay attention to any changes in your symptoms. Take these actions to help with your pain:  Rest the wrist area for at least 48 hours or as told by your health care provider.  If directed, apply ice to the injured area:  Put ice in a plastic bag.  Place a towel between your skin and the bag.  Leave the ice on for 20 minutes, 2-3 times per day.  Keep your arm raised (elevated) above the level of your heart while you are sitting or lying down.  If a splint or elastic bandage has been applied, use it as told by your health care provider.  Remove the splint or bandage only as told by your health care provider.  Loosen the splint or bandage if your fingers become numb or have a tingling feeling, or if they turn cold or blue.  Take over-the-counter and prescription medicines only as told by your health care provider.  Keep all follow-up visits as told by your health care provider. This is important. SEEK MEDICAL CARE IF:  Your pain is not helped by treatment.  Your pain gets worse. SEEK IMMEDIATE MEDICAL CARE IF:  Your fingers become  swollen.  Your fingers turn white, very red, or cold and blue.  Your fingers are numb or have a tingling feeling.  You have difficulty moving your fingers.   This information is not intended to replace advice given to you by your health care provider. Make sure you discuss any questions you have with your health care provider.   Document Released: 12/11/2004 Document Revised: 11/22/2014 Document Reviewed: 07/19/2014 Elsevier Interactive Patient Education 2016 [employer]  Cryotherapy Cryotherapy means treatment with cold. Ice or gel packs can be used to reduce both pain and swelling. Ice is the most helpful within the first 24 to 48 hours after an injury or flare-up from overusing a muscle or joint. Sprains, strains, spasms, burning pain, shooting pain, and aches can all be eased with ice. Ice can also be used when recovering from surgery. Ice is effective, has very few side effects, and is safe for most people to use. PRECAUTIONS  Ice is not a safe treatment option for people with:  Raynaud phenomenon. This is a condition affecting small blood vessels in the extremities. Exposure to cold may cause your problems to return.  Cold hypersensitivity. There are many forms of cold hypersensitivity, including:  Cold urticaria. Red, itchy hives appear on the skin when the tissues begin to warm after being iced.  Cold erythema. This is a red, itchy rash caused by exposure to cold.  Cold hemoglobinuria. Red blood cells break down  when the tissues begin to warm after being iced. The hemoglobin that carry oxygen are passed into the urine because they cannot combine with blood proteins fast enough.  Numbness or altered sensitivity in the area being iced. If you have any of the following conditions, do not use ice until you have discussed cryotherapy with your caregiver:  Heart conditions, such as arrhythmia, angina, or chronic heart disease.  High blood pressure.  Healing wounds or open skin  in the area being iced.  Current infections.  Rheumatoid arthritis.  Poor circulation.  Diabetes. Ice slows the blood flow in the region it is applied. This is beneficial when trying to stop inflamed tissues from spreading irritating chemicals to surrounding tissues. However, if you expose your skin to cold temperatures for too long or without the proper protection, you can damage your skin or nerves. Watch for signs of skin damage due to cold. HOME CARE INSTRUCTIONS Follow these tips to use ice and cold packs safely.  Place a dry or damp towel between the ice and skin. A damp towel will cool the skin more quickly, so you may need to shorten the time that the ice is used.  For a more rapid response, add gentle compression to the ice.  Ice for no more than 10 to 20 minutes at a time. The bonier the area you are icing, the less time it will take to get the benefits of ice.  Check your skin after 5 minutes to make sure there are no signs of a poor response to cold or skin damage.  Rest 20 minutes or more between uses.  Once your skin is numb, you can end your treatment. You can test numbness by very lightly touching your skin. The touch should be so light that you do not see the skin dimple from the pressure of your fingertip. When using ice, most people will feel these normal sensations in this order: cold, burning, aching, and numbness.  Do not use ice on someone who cannot communicate their responses to pain, such as small children or people with dementia. HOW TO MAKE AN ICE PACK Ice packs are the most common way to use ice therapy. Other methods include ice massage, ice baths, and cryosprays. Muscle creams that cause a cold, tingly feeling do not offer the same benefits that ice offers and should not be used as a substitute unless recommended by your caregiver. To make an ice pack, do one of the following:  Place crushed ice or a bag of frozen vegetables in a sealable plastic bag.  Squeeze out the excess air. Place this bag inside another plastic bag. Slide the bag into a pillowcase or place a damp towel between your skin and the bag.  Mix 3 parts water with 1 part rubbing alcohol. Freeze the mixture in a sealable plastic bag. When you remove the mixture from the freezer, it will be slushy. Squeeze out the excess air. Place this bag inside another plastic bag. Slide the bag into a pillowcase or place a damp towel between your skin and the bag. SEEK MEDICAL CARE IF:  You develop white spots on your skin. This may give the skin a blotchy (mottled) appearance.  Your skin turns blue or pale.  Your skin becomes waxy or hard.  Your swelling gets worse. MAKE SURE YOU:   Understand these instructions.  Will watch your condition.  Will get help right away if you are not doing well or get worse.   This  information is not intended to replace advice given to you by your health care provider. Make sure you discuss any questions you have with your health care provider.   Document Released: 10/28/2010 Document Revised: 03/24/2014 Document Reviewed: 10/28/2010 Elsevier Interactive Patient Education [employer]

## 2015-06-06 NOTE — ED Provider Notes (Signed)
-----------------------------------------   4:01 PM on 06/06/2015 -----------------------------------------  Pharmacy called about concerns filling Percocet because the patient is under the care of Dr. crisp and recently had a large prescription for methadone filled.  I called and spoke by phone with Dr. Dolores FrameSung who explained that the patient did not disclose her methadone prescription or care under the pain management doctor.  I informed the pharmacist and, as per Dr. Ardine BjorkSung's instructions, told him not to fill the prescription and to refer the patient back to her primary pain management doctor.  Loleta Roseory Latrecia Capito, MD 06/06/15 [phone]

## 2015-06-06 NOTE — ED Provider Notes (Signed)
[facility] Emergency Department Provider Note  ____________________________________________  Time seen: Approximately 3:03 AM  I have reviewed the triage vital signs and the nursing notes.   HISTORY  Chief Complaint Wrist Pain    HPI Cheyenne Gray is a 50 y.o. female who presents to the ED with a chief complaint of fall with right wrist pain. Patient relates mechanical fall from tripping on loose gravel; fell onto her right side. Denies LOC. Complains of right wrist pain. Initially had some redness noted to the right side of her face and nose which are now gone. Denies associated swelling, numbness/tingling, weakness. Denies headache, neck pain, blurry vision, chest pain, shortness of breath, abdominal pain, nausea, vomiting, diarrhea. Nothing makes her pain better. Movement makes her pain worse. Patient is right-hand dominant.   Past Medical History  Diagnosis Date  . Chronic pain     a. on methadone  . DJD (degenerative joint disease)   . Iron deficiency anemia   . Long QT interval   . Syncope and collapse   . Iron deficiency   . History of echocardiogram     a. echo 2014: EF 65-70%, nl WM, mildly dilated LA, PASP nl  . Arthritis   . Asthma   . Obesity   . Migraine   . Psoriasis   . Chronic back pain   . DJD (degenerative joint disease), multiple sites   . B12 deficiency   . Vitamin D deficiency   . Depression   . History of shingles   . Palpitations     a. 24 hour Holter: NSR, sinus brady down to 48, occasional PVCs & couplets, 8 beats NSVT; b. 30 day event monitor 2015: NSR with rare PVC  . Hand, foot and mouth disease 2016    Patient Active Problem List   Diagnosis Date Noted  . Bilateral occipital neuralgia 06/04/2015  . Migraine headache 06/04/2015  . Angina pectoris (HCC) 02/07/2015  . IDA (iron deficiency anemia) 09/19/2014  . 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 DDD (degenerative disc disease), lumbar  09/14/2014  . Sacroiliac joint dysfunction 09/14/2014  . Thoracic facet syndrome 09/14/2014  . Facet syndrome, lumbar 09/14/2014  . Hyperlipidemia 01/25/2014  . PVC (premature ventricular contraction) 01/10/2014  . Fatigue 05/25/2013  . CAD (coronary artery disease) 04/22/2013  . PVD (peripheral vascular disease) (HCC) 04/22/2013  . Long Q-T syndrome 07/09/2012  . Chest pressure 07/09/2012  . NSVT (nonsustained ventricular tachycardia) (HCC) 07/09/2012  . Syncope 07/09/2012  . MUSCLE STRAIN 07/20/2009    Past Surgical History  Procedure Laterality Date  . Bariatric surgery  2001  . Cholecystectomy  2001  . Gastroplasty    . Gastric bypass    . Gallbladder surgery      Current Outpatient Rx  Name  Route  Sig  Dispense  Refill  . albuterol (PROVENTIL HFA;VENTOLIN HFA) 108 (90 BASE) MCG/ACT inhaler   Inhalation   Inhale 2 puffs into the lungs as needed for wheezing.         Marland Kitchen atorvastatin (LIPITOR) 20 MG tablet   Oral   Take 1 tablet (20 mg total) by mouth daily.   90 tablet   3   . citalopram (CELEXA) 10 MG tablet   Oral   Take 20 mg by mouth daily.          . cyclobenzaprine (FLEXERIL) 10 MG tablet   Oral   Take 10 mg by mouth at bedtime as needed for muscle spasms.         Marland Kitchen  gabapentin (NEURONTIN) 400 MG capsule      Limit 2 tablets in the a.m. and midday and 3 tablets each evening   210 capsule   0   . methadone (DOLOPHINE) 10 MG tablet      Limit 1-2 tablets by mouth 2-3 times per day if tolerated   180 tablet   0   . potassium chloride (KLOR-CON 10) 10 MEQ tablet   Oral   Take 1 tablet (10 mEq total) by mouth daily as needed. Patient not taking: Reported on 06/04/2015   30 tablet   3   . SUMAtriptan (IMITREX) 25 MG tablet   Oral   Take 25 mg by mouth every 2 (two) hours as needed for migraine. May repeat in 2 hours if headache persists or recurs.           Allergies Penicillins  Family History  Problem Relation Age of Onset  . Heart  attack Mother   . Cancer Mother   . Heart attack Father 77    MI  . Heart attack Brother   . Heart disease Brother   . Heart attack Maternal Grandmother   . Cancer Maternal Grandmother   . Cancer Maternal Uncle     Social History Social History  Substance Use Topics  . Smoking status: Never Smoker   . Smokeless tobacco: None  . Alcohol Use: No    Review of Systems  Constitutional: No fever/chills. Eyes: No visual changes. ENT: No sore throat. Cardiovascular: Denies chest pain. Respiratory: Denies shortness of breath. Gastrointestinal: No abdominal pain.  No nausea, no vomiting.  No diarrhea.  No constipation. Genitourinary: Negative for dysuria. Musculoskeletal: Positive for right wrist pain. Negative for back pain. Skin: Negative for rash. Neurological: Negative for headaches, focal weakness or numbness.  10-point ROS otherwise negative.  ____________________________________________   PHYSICAL EXAM:  VITAL SIGNS: ED Triage Vitals  Enc Vitals Group     BP 06/05/15 2345 133/79 mmHg     Pulse Rate 06/05/15 2345 65     Resp 06/05/15 2345 18     Temp 06/05/15 2345 98.3 F (36.8 C)     Temp Source 06/05/15 2345 Oral     SpO2 06/05/15 2345 99 %     Weight 06/05/15 2345 200 lb (90.719 kg)     Height 06/05/15 2345  (1.676 m)     Head Cir --      Peak Flow --      Pain Score 06/05/15 2347 10     Pain Loc --      Pain Edu? --      Excl. in GC? --     Constitutional: Alert and oriented. Well appearing and in no acute distress. Eyes: Conjunctivae are normal. PERRL. EOMI. Head: Atraumatic. Nose: No congestion/rhinnorhea. Mouth/Throat: Mucous membranes are moist.  Oropharynx non-erythematous. Neck: No stridor.  No cervical spine tenderness to palpation. Cardiovascular: Normal rate, regular rhythm. Grossly normal heart sounds.  Good peripheral circulation. Respiratory: Normal respiratory effort.  No retractions. Lungs CTAB. Gastrointestinal: Soft and  nontender. No distention. No abdominal bruits. No CVA tenderness. Musculoskeletal: Right wrist at ulnar styloid tender to palpation. No associated swelling or deformity. Minimally limited range of motion secondary to pain. 2+ radial pulses. Brisk, less than 5 second capillary refill. No lower extremity tenderness nor edema.  No joint effusions. Neurologic:  Normal speech and language. No gross focal neurologic deficits are appreciated. No gait instability. Skin:  Skin is warm, dry and intact. No rash  noted. Psychiatric: Mood and affect are normal. Speech and behavior are normal.  ____________________________________________   LABS (all labs ordered are listed, but only abnormal results are displayed)  Labs Reviewed - No data to display ____________________________________________  EKG  None ____________________________________________  RADIOLOGY  Right wrist xray (viewed by me, interpreted per Dr. Gwenyth Benderadparvar): Negative. ____________________________________________   PROCEDURES  Procedure(s) performed: None  Critical Care performed: No  ____________________________________________   INITIAL IMPRESSION / ASSESSMENT AND PLAN / ED COURSE  Pertinent labs & imaging results that were available during my care of the patient were reviewed by me and considered in my medical decision making (see chart for details).  50 year old female who presents with right wrist pain s/p mechanical fall. Will place in velcro wrist splint, analgesia and follow-up with orthopedics. Strict return precautions given. Patient verbalizes understanding and agrees with plan of care. ____________________________________________   FINAL CLINICAL IMPRESSION(S) / ED DIAGNOSES  Final diagnoses:  Wrist pain, acute, right  Fall, initial encounter      Irean HongJade J Sung, MD 06/06/15 [phone]

## 2015-06-06 NOTE — ED Notes (Signed)
Pt updated on wait time; 800mg  ibuprofen admin at request for wrist pain

## 2015-06-19 ENCOUNTER — Ambulatory Visit: Payer: Medicare Other | Admitting: Pain Medicine

## 2015-06-25 ENCOUNTER — Other Ambulatory Visit: Payer: Self-pay | Admitting: Family Medicine

## 2015-06-25 DIAGNOSIS — R519 Headache, unspecified: Secondary | ICD-10-CM

## 2015-06-25 DIAGNOSIS — R51 Headache: Secondary | ICD-10-CM

## 2015-06-25 DIAGNOSIS — R29818 Other symptoms and signs involving the nervous system: Secondary | ICD-10-CM

## 2015-06-27 ENCOUNTER — Ambulatory Visit: Payer: Medicare Other | Attending: Pain Medicine | Admitting: Pain Medicine

## 2015-06-27 ENCOUNTER — Encounter: Payer: Self-pay | Admitting: Pain Medicine

## 2015-06-27 VITALS — BP 145/88 | HR 69 | Temp 97.5°F | Resp 16 | Ht 65.0 in | Wt 225.0 lb

## 2015-06-27 DIAGNOSIS — G588 Other specified mononeuropathies: Secondary | ICD-10-CM

## 2015-06-27 DIAGNOSIS — G43119 Migraine with aura, intractable, without status migrainosus: Secondary | ICD-10-CM

## 2015-06-27 DIAGNOSIS — M4806 Spinal stenosis, lumbar region: Secondary | ICD-10-CM | POA: Diagnosis not present

## 2015-06-27 DIAGNOSIS — M5116 Intervertebral disc disorders with radiculopathy, lumbar region: Secondary | ICD-10-CM | POA: Insufficient documentation

## 2015-06-27 DIAGNOSIS — M48062 Spinal stenosis, lumbar region with neurogenic claudication: Secondary | ICD-10-CM

## 2015-06-27 DIAGNOSIS — M5134 Other intervertebral disc degeneration, thoracic region: Secondary | ICD-10-CM

## 2015-06-27 DIAGNOSIS — M5416 Radiculopathy, lumbar region: Secondary | ICD-10-CM

## 2015-06-27 DIAGNOSIS — M542 Cervicalgia: Secondary | ICD-10-CM | POA: Diagnosis present

## 2015-06-27 DIAGNOSIS — R51 Headache: Secondary | ICD-10-CM | POA: Insufficient documentation

## 2015-06-27 DIAGNOSIS — M79601 Pain in right arm: Secondary | ICD-10-CM | POA: Insufficient documentation

## 2015-06-27 DIAGNOSIS — M47894 Other spondylosis, thoracic region: Secondary | ICD-10-CM

## 2015-06-27 DIAGNOSIS — G43109 Migraine with aura, not intractable, without status migrainosus: Secondary | ICD-10-CM

## 2015-06-27 DIAGNOSIS — M533 Sacrococcygeal disorders, not elsewhere classified: Secondary | ICD-10-CM | POA: Insufficient documentation

## 2015-06-27 DIAGNOSIS — M2578 Osteophyte, vertebrae: Secondary | ICD-10-CM | POA: Diagnosis not present

## 2015-06-27 DIAGNOSIS — M47814 Spondylosis without myelopathy or radiculopathy, thoracic region: Secondary | ICD-10-CM

## 2015-06-27 DIAGNOSIS — G43909 Migraine, unspecified, not intractable, without status migrainosus: Secondary | ICD-10-CM | POA: Insufficient documentation

## 2015-06-27 DIAGNOSIS — M5136 Other intervertebral disc degeneration, lumbar region: Secondary | ICD-10-CM

## 2015-06-27 DIAGNOSIS — M5481 Occipital neuralgia: Secondary | ICD-10-CM | POA: Diagnosis not present

## 2015-06-27 DIAGNOSIS — M5126 Other intervertebral disc displacement, lumbar region: Secondary | ICD-10-CM | POA: Insufficient documentation

## 2015-06-27 DIAGNOSIS — M47816 Spondylosis without myelopathy or radiculopathy, lumbar region: Secondary | ICD-10-CM

## 2015-06-27 MED ORDER — METHADONE HCL 10 MG PO TABS: ORAL_TABLET | ORAL | Status: DC

## 2015-06-27 MED ORDER — GABAPENTIN 400 MG PO CAPS: ORAL_CAPSULE | ORAL | Status: DC

## 2015-06-27 NOTE — Progress Notes (Signed)
Subjective:    Patient ID: Cheyenne Gray, female    DOB: Jul 16, 1965, 50 y.o.   MRN: [id number]  HPI  The patient is a 50 year old female who returns to pain management for further evaluation and treatment of pain involving the region of the neck associated with headaches as well as upper mid lower back and lower extremity regions. The patient states that she has had exacerbation of her pain of the lower back following a fall. The patient sustained trauma to the right upper extremity. The patient will undergo further orthopedic evaluation for further assessment of the right upper extremities trauma. There is concern regarding severe strain and sprain of the right upper extremity versus fracture. The patient is with lower back lower extremity pain exacerbated by the fall. We have discussed patient's condition and will continue Neurontin and methadone at this time we will consider patient for interventional treatment at time of return appointment consisting of lumbosacral selective nerve root block as discussed and as explained to patient on today's visit who was with understanding and in agreement with suggested treatment plan.  Review of Systems     Objective:   Physical Exam  There was tenderness to palpation of the splenius capitis and occipitalis musculature regions of mild degree with mild tenderness of the cervical facet cervical paraspinal musculature region. No masses of the head and neck were noted. No bounding pulsations of the temporal region were noted. Palpation over the cervical facet cervical paraspinal musculature and thoracic facet thoracic paraspinal muscular treat was associated with moderate discomfort with moderate to moderately severe muscle spasms involving the lower thoracic paraspinal musculature region. No crepitus of the thoracic region was noted. The patient was with tenderness to palpation of the right upper extremity with Tinel and Phalen's maneuver attempted and  producing moderate discomfort. There was decreased grip strength on the right compared to the left. There were areas of tenderness to palpation of the right upper extremity. Palpation over the lumbar paraspinal must reason lumbar facet region was attends to palpation of moderate to moderately severe degree with lateral bending rotation extension and palpation over the lumbar facets reproducing moderate to moderately severe discomfort. Straight leg raise was tolerates approximately 20 with concern regarding increased pain with dorsiflexion noted. EHL strength appeared to be decreased area there was tenderness over the PSIS and PII S region as well as the gluteal and piriformis musculature region. Palpation of the greater trochanteric region iliotibial band region was with mild discomfort. There was negative clonus negative Homans. Abdomen nontender with no costovertebral tenderness noted      Assessment & Plan:    Migraine headache  Bilateral occipital neuralgia  Degenerative disc disease lumbar spine L3 L4 multifactorial moderate to marked spinal stenosis.  L4-L5 bulge with superimposed central disc protrusion slightly greater to the left. Indentation ventral aspect of the thecal sac slightly greater to the left. Facet degenerative changes. L5-S1 mild bulge with osteophyte with foraminal/lateral extension slightly greater on the left. Mild encroachment upon but not compression of the exiting L5 nerve roots. Facet joint degenerative changes.  Lumbar stenosis with neurogenic claudication  Lumbar radiculopathy  Lumbar facet syndrome  Sacroiliac joint dysfunction  Right upper extremity, secondary to fall (sprain versus fracture)     PLAN  Continue present medication Neurontin and methadone  Lumbosacral selective nerve root block to be performed at time of return appointment  F/U PCP Dr.Aycock  for evaliation of  BP fatigue and general medical  condition.  F/U surgical  evaluation .Marland Kitchen.  Please ask receptionist and nurses the date of your neurosurgical evaluation Orthopedic evaluation of right upper extremity trauma as planned  F/U neurological evaluation. May consider PNCV/EMG pending follow-up evaluations  May consider radiofrequency rhizolysis or intraspinal procedures pending response to present treatment and F/U evaluation.  Patient to call [facility] should patient have concerns prior to scheduled return appointment

## 2015-06-27 NOTE — Patient Instructions (Addendum)
PLAN  Continue present medication Neurontin and methadone  Lumbosacral selective nerve root block to be performed at time of return appointment  F/U PCP Dr.Aycock  for evaliation of  BP fatigue and general medical  condition.  F/U surgical evaluation .Marland Kitchen. Please ask receptionist and nurses the date of your neurosurgical evaluation  F/U neurological evaluation. May consider PNCV/EMG pending follow-up evaluations  May consider radiofrequency rhizolysis or intraspinal procedures pending response to present treatment and F/U evaluation.  Patient to call [facility] should patient have concerns prior to scheduled return appointment

## 2015-06-27 NOTE — Progress Notes (Signed)
Safety precautions to be maintained throughout the outpatient stay will include: orient to surroundings, keep bed in low position, maintain call bell within reach at all times, provide assistance with transfer out of bed and ambulation.  

## 2015-07-03 LAB — TOXASSURE SELECT 13 (MW), URINE: PDF: 0

## 2015-07-09 ENCOUNTER — Encounter: Payer: Self-pay | Admitting: Pain Medicine

## 2015-07-09 ENCOUNTER — Ambulatory Visit: Payer: Medicare Other | Attending: Pain Medicine | Admitting: Pain Medicine

## 2015-07-09 VITALS — BP 133/73 | HR 67 | Temp 98.5°F | Resp 16 | Ht 65.0 in | Wt 230.0 lb

## 2015-07-09 DIAGNOSIS — G43119 Migraine with aura, intractable, without status migrainosus: Secondary | ICD-10-CM

## 2015-07-09 DIAGNOSIS — M2578 Osteophyte, vertebrae: Secondary | ICD-10-CM | POA: Diagnosis not present

## 2015-07-09 DIAGNOSIS — G43109 Migraine with aura, not intractable, without status migrainosus: Secondary | ICD-10-CM

## 2015-07-09 DIAGNOSIS — M545 Low back pain: Secondary | ICD-10-CM | POA: Diagnosis present

## 2015-07-09 DIAGNOSIS — M5481 Occipital neuralgia: Secondary | ICD-10-CM

## 2015-07-09 DIAGNOSIS — M5126 Other intervertebral disc displacement, lumbar region: Secondary | ICD-10-CM | POA: Diagnosis not present

## 2015-07-09 DIAGNOSIS — M5136 Other intervertebral disc degeneration, lumbar region: Secondary | ICD-10-CM | POA: Insufficient documentation

## 2015-07-09 DIAGNOSIS — M4806 Spinal stenosis, lumbar region: Secondary | ICD-10-CM | POA: Insufficient documentation

## 2015-07-09 DIAGNOSIS — M47816 Spondylosis without myelopathy or radiculopathy, lumbar region: Secondary | ICD-10-CM

## 2015-07-09 DIAGNOSIS — M47894 Other spondylosis, thoracic region: Secondary | ICD-10-CM

## 2015-07-09 DIAGNOSIS — M47814 Spondylosis without myelopathy or radiculopathy, thoracic region: Secondary | ICD-10-CM

## 2015-07-09 DIAGNOSIS — M533 Sacrococcygeal disorders, not elsewhere classified: Secondary | ICD-10-CM

## 2015-07-09 DIAGNOSIS — M5134 Other intervertebral disc degeneration, thoracic region: Secondary | ICD-10-CM

## 2015-07-09 DIAGNOSIS — M79606 Pain in leg, unspecified: Secondary | ICD-10-CM | POA: Diagnosis present

## 2015-07-09 DIAGNOSIS — M5416 Radiculopathy, lumbar region: Secondary | ICD-10-CM

## 2015-07-09 DIAGNOSIS — M48062 Spinal stenosis, lumbar region with neurogenic claudication: Secondary | ICD-10-CM

## 2015-07-09 DIAGNOSIS — G588 Other specified mononeuropathies: Secondary | ICD-10-CM

## 2015-07-09 MED ORDER — LACTATED RINGERS IV SOLN: 1000.0000 mL | INTRAVENOUS | Status: DC

## 2015-07-09 MED ORDER — BUPIVACAINE HCL (PF) 0.25 % IJ SOLN
INTRAMUSCULAR | Status: AC
  Administered 2015-07-09: 10:00:00
  Filled 2015-07-09: qty 30

## 2015-07-09 MED ORDER — FENTANYL CITRATE (PF) 100 MCG/2ML IJ SOLN
INTRAMUSCULAR | Status: AC
  Administered 2015-07-09: 100 ug
  Filled 2015-07-09: qty 2

## 2015-07-09 MED ORDER — LIDOCAINE HCL (PF) 1 % IJ SOLN: 10.0000 mL | Freq: Once | INTRAMUSCULAR | Status: DC

## 2015-07-09 MED ORDER — ORPHENADRINE CITRATE 30 MG/ML IJ SOLN
INTRAMUSCULAR | Status: AC
  Administered 2015-07-09: 10:00:00
  Filled 2015-07-09: qty 2

## 2015-07-09 MED ORDER — BUPIVACAINE HCL (PF) 0.25 % IJ SOLN: 30.0000 mL | Freq: Once | INTRAMUSCULAR | Status: DC

## 2015-07-09 MED ORDER — TRIAMCINOLONE ACETONIDE 40 MG/ML IJ SUSP
INTRAMUSCULAR | Status: AC
  Administered 2015-07-09: 10:00:00
  Filled 2015-07-09: qty 1

## 2015-07-09 MED ORDER — MIDAZOLAM HCL 5 MG/5ML IJ SOLN
INTRAMUSCULAR | Status: AC
  Administered 2015-07-09: 5 mg via INTRAVENOUS
  Filled 2015-07-09: qty 5

## 2015-07-09 MED ORDER — FENTANYL CITRATE (PF) 100 MCG/2ML IJ SOLN: 100.0000 ug | Freq: Once | INTRAMUSCULAR | Status: DC

## 2015-07-09 MED ORDER — TRIAMCINOLONE ACETONIDE 40 MG/ML IJ SUSP: 40.0000 mg | Freq: Once | INTRAMUSCULAR | Status: DC

## 2015-07-09 MED ORDER — ORPHENADRINE CITRATE 30 MG/ML IJ SOLN: 60.0000 mg | Freq: Once | INTRAMUSCULAR | Status: DC

## 2015-07-09 NOTE — Progress Notes (Signed)
Patient here today for medication management.  Needs refills on flexeril, methadone and needs rx straightened out at pharmacy in order to get 90 day supply on her gabapentin.  Patient also states that pain medication is no longer effective and feels that medications need to be adjusted. Safety precautions to be maintained throughout the outpatient stay will include: orient to surroundings, keep bed in low position, maintain call bell within reach at all times, provide assistance with transfer out of bed and ambulation.

## 2015-07-09 NOTE — Patient Instructions (Signed)
PLAN  Continue present medications  Neurontin and methadone  F/U PCP Dr.Aycock  for evaliation of  BP fatigue and general medical  condition.  F/U surgical evaluation .Marland Kitchen. Please ask receptionist and nurses the date of your neurosurgical evaluation  F/U neurological evaluation. May consider PNCV/EMG pending follow-up evaluations  May consider radiofrequency rhizolysis or intraspinal procedures pending response to present treatment and F/U evaluation.  Patient is to call pain management prior to scheduled return appointment should there be change in condition or should patient have other concerns regarding condition prior to scheduled return appointment

## 2015-07-09 NOTE — Progress Notes (Signed)
Subjective:    Patient ID: Cheyenne Gray, female    DOB: [birth year], 50 y.o.   MRN: [id number]  HPI  PROCEDURE PERFORMED: Lumbosacral selective nerve root block   NOTE: The patient is a 50 y.o. female who [facility] for further evaluation and treatment of pain involving the lumbar and lower extremity region. Studies consisting of MRI has revealed the patient to be with evidence of degenerative disc disease lumbar spine L3 L4 multifactorial moderate to marked spinal stenosis. L4-L5 bulge with superimposed central disc protrusion slightly greater to the left. Indentation ventral aspect of the thecal sac slightly greater to the left. Facet degenerative changes. L5-S1 mild bulge with osteophyte with foraminal/lateral extension slightly greater on the left. Mild encroachment upon but not compression of the exiting L5 nerve roots. Facet joint degenerative changes.. There is concern regarding intraspinal abnormalities contributing to the patient's symptomatology with concern regarding component of pain due to lumbar radiculopathy The risks, benefits, and expectations of the procedure have been explained to the patient who was understanding and in agreement with suggested treatment plan. We will proceed with interventional treatment as discussed and as explained to the patient. The patient is understanding and in agreement with suggested treatment plan.   DESCRIPTION OF PROCEDURE: Lumbosacral selective nerve root block with IV Versed, IV fentanyl conscious sedation, EKG, blood pressure, pulse, capnography, and pulse oximetry monitoring. The procedure was performed with the patient in the prone position under fluoroscopic guidance. With the patient in the prone position, Betadine prep of proposed entry site was performed. Local anesthetic skin wheal of proposed needle entry site was prepared with 1.5% plain lidocaine with AP view of the lumbosacral spine.   PROCEDURE #1: Needle  placement at the left L 2 vertebral body: A 22 -gauge needle was inserted at the inferior border of the transverse process of the vertebral body with needle placed medial to the midline of the transverse process on AP view of the lumbosacral spine.   NEEDLE PLACEMENT AT  L3, L4, and L5  VERTEBRAL BODY LEVELS  Needle  placement was accomplished at L3, L4, and L5  vertebral body levels on the left side exactly as was accomplished at the L2  vertebral body level  and utilizing the same technique and under fluoroscopic guidance.   Needle placement was then verified on lateral view at all levels with needle tip documented to be in the posterior superior quadrant of the intervertebral foramen of  L 2, L3, L4, and L5. Following negative aspiration for heme and CSF at each level, each level was injected with 3 mL of 0.25% bupivacaine with Kenalog.   LUMBOSACRAL SELECTIVE NERVE ROOT BLOCKS THE THE  RIGHT SIDE  The procedure was performed on the right side exactly as was performed on the left side and at the same levels  Under fluoroscopic guidance and utilizing the same technique.    The patient tolerated the procedure well. A total of 10 mg of Kenalog was utilized for the procedure.   PLAN:  1. Medications: Will continue presently prescribed medication Neurontin and methadone 2. The patient is to undergo follow-up evaluation with PCP Dr. Letta Pate for evaluation of blood pressure and general medical condition status post procedure performed on today's visit. 3. Surgical follow-up evaluation. Has been addressed 4. Neurological evaluation. Has been addressed 5. May consider radiofrequency procedures, implantation type procedures and other treatment pending response to treatment and follow-up evaluation. 6. The patient has been advise do adhere to proper  body mechanics and avoid activities which may aggravate condition. 7. The patient has been advised to call the [facility] prior to scheduled  return appointment should there be significant change in the patient's condition or should the patient have other concerns regarding condition prior to scheduled return appointment.   Review of Systems     Objective:   Physical Exam        Assessment & Plan:

## 2015-07-10 ENCOUNTER — Telehealth: Payer: Self-pay | Admitting: *Deleted

## 2015-07-10 NOTE — Telephone Encounter (Signed)
No problems post procedure. 

## 2015-07-16 ENCOUNTER — Ambulatory Visit: Payer: Medicare Other | Attending: Family Medicine

## 2015-07-25 ENCOUNTER — Ambulatory Visit: Payer: Medicare Other | Attending: Pain Medicine | Admitting: Pain Medicine

## 2015-07-25 ENCOUNTER — Encounter: Payer: Self-pay | Admitting: Pain Medicine

## 2015-07-25 VITALS — BP 117/87 | HR 84 | Temp 97.6°F | Resp 16 | Ht 66.0 in | Wt 220.0 lb

## 2015-07-25 DIAGNOSIS — M47814 Spondylosis without myelopathy or radiculopathy, thoracic region: Secondary | ICD-10-CM

## 2015-07-25 DIAGNOSIS — M5416 Radiculopathy, lumbar region: Secondary | ICD-10-CM

## 2015-07-25 DIAGNOSIS — M47816 Spondylosis without myelopathy or radiculopathy, lumbar region: Secondary | ICD-10-CM

## 2015-07-25 DIAGNOSIS — G43909 Migraine, unspecified, not intractable, without status migrainosus: Secondary | ICD-10-CM | POA: Diagnosis not present

## 2015-07-25 DIAGNOSIS — M5481 Occipital neuralgia: Secondary | ICD-10-CM | POA: Insufficient documentation

## 2015-07-25 DIAGNOSIS — M47894 Other spondylosis, thoracic region: Secondary | ICD-10-CM

## 2015-07-25 DIAGNOSIS — M5116 Intervertebral disc disorders with radiculopathy, lumbar region: Secondary | ICD-10-CM | POA: Insufficient documentation

## 2015-07-25 DIAGNOSIS — G43119 Migraine with aura, intractable, without status migrainosus: Secondary | ICD-10-CM

## 2015-07-25 DIAGNOSIS — M5126 Other intervertebral disc displacement, lumbar region: Secondary | ICD-10-CM | POA: Diagnosis not present

## 2015-07-25 DIAGNOSIS — M48062 Spinal stenosis, lumbar region with neurogenic claudication: Secondary | ICD-10-CM

## 2015-07-25 DIAGNOSIS — M542 Cervicalgia: Secondary | ICD-10-CM | POA: Diagnosis present

## 2015-07-25 DIAGNOSIS — M4806 Spinal stenosis, lumbar region: Secondary | ICD-10-CM | POA: Diagnosis not present

## 2015-07-25 DIAGNOSIS — G43109 Migraine with aura, not intractable, without status migrainosus: Secondary | ICD-10-CM

## 2015-07-25 DIAGNOSIS — M533 Sacrococcygeal disorders, not elsewhere classified: Secondary | ICD-10-CM | POA: Insufficient documentation

## 2015-07-25 DIAGNOSIS — G588 Other specified mononeuropathies: Secondary | ICD-10-CM

## 2015-07-25 DIAGNOSIS — M2578 Osteophyte, vertebrae: Secondary | ICD-10-CM | POA: Insufficient documentation

## 2015-07-25 DIAGNOSIS — M79606 Pain in leg, unspecified: Secondary | ICD-10-CM | POA: Diagnosis present

## 2015-07-25 DIAGNOSIS — M5134 Other intervertebral disc degeneration, thoracic region: Secondary | ICD-10-CM

## 2015-07-25 DIAGNOSIS — M5136 Other intervertebral disc degeneration, lumbar region: Secondary | ICD-10-CM

## 2015-07-25 MED ORDER — METHADONE HCL 10 MG PO TABS: ORAL_TABLET | ORAL | Status: DC

## 2015-07-25 MED ORDER — GABAPENTIN 400 MG PO CAPS: ORAL_CAPSULE | ORAL | Status: DC

## 2015-07-25 NOTE — Patient Instructions (Addendum)
PLAN  Continue present medication Neurontin and methadone. NO TYLENOL Tylenol can cause severe damage to liver kidney   Stellate ganglion block to be performed at time return appointment  F/U PCP Dr.Aycock  for evaliation of  BP fatigue and general medical  condition. Also discussed Tylenol consumption with Dr. Letta Pate . STOP TYLENOL CONSUMPTION PLEASE  F/U surgical evaluation .Marland Kitchen Please ask receptionist and nurses the date of your neurosurgical evaluation  F/U neurological evaluation and MRI of hand as scheduled  May consider radiofrequency rhizolysis or intraspinal procedures pending response to present treatment and F/U evaluation.  Patient to call [facility] should patient have concerns prior to scheduled return appointmentGENERAL RISKS AND COMPLICATIONS  What are the risk, side effects and possible complications? Generally speaking, most procedures are safe.  However, with any procedure there are risks, side effects, and the possibility of complications.  The risks and complications are dependent upon the sites that are lesioned, or the type of nerve block to be performed.  The closer the procedure is to the spine, the more serious the risks are.  Great care is taken when placing the radio frequency needles, block needles or lesioning probes, but sometimes complications can occur. 1. Infection: Any time there is an injection through the skin, there is a risk of infection.  This is why sterile conditions are used for these blocks.  There are four possible types of infection. 1. Localized skin infection. 2. Central Nervous System Infection-This can be in the form of Meningitis, which can be deadly. 3. Epidural Infections-This can be in the form of an epidural abscess, which can cause pressure inside of the spine, causing compression of the spinal cord with subsequent paralysis. This would require an emergency surgery to decompress, and there are no guarantees that the patient would  recover from the paralysis. 4. Discitis-This is an infection of the intervertebral discs.  It occurs in about 1% of discography procedures.  It is difficult to treat and it may lead to surgery.        2. Pain: the needles have to go through skin and soft tissues, will cause soreness.       3. Damage to internal structures:  The nerves to be lesioned may be near blood vessels or    other nerves which can be potentially damaged.       4. Bleeding: Bleeding is more common if the patient is taking blood thinners such as  aspirin, Coumadin, Ticiid, Plavix, etc., or if he/she have some genetic predisposition  such as hemophilia. Bleeding into the spinal canal can cause compression of the spinal  cord with subsequent paralysis.  This would require an emergency surgery to  decompress and there are no guarantees that the patient would recover from the  paralysis.       5. Pneumothorax:  Puncturing of a lung is a possibility, every time a needle is introduced in  the area of the chest or upper back.  Pneumothorax refers to free air around the  collapsed lung(s), inside of the thoracic cavity (chest cavity).  Another two possible  complications related to a similar event would include: Hemothorax and Chylothorax.   These are variations of the Pneumothorax, where instead of air around the collapsed  lung(s), you may have blood or chyle, respectively.       6. Spinal headaches: They may occur with any procedures in the area of the spine.       7. Persistent CSF (Cerebro-Spinal Fluid)  leakage: This is a rare problem, but may occur  with prolonged intrathecal or epidural catheters either due to the formation of a fistulous  track or a dural tear.       8. Nerve damage: By working so close to the spinal cord, there is always a possibility of  nerve damage, which could be as serious as a permanent spinal cord injury with  paralysis.       9. Death:  Although rare, severe deadly allergic reactions known as "Anaphylactic   reaction" can occur to any of the medications used.      10. Worsening of the symptoms:  We can always make thing worse.  What are the chances of something like this happening? Chances of any of this occuring are extremely low.  By statistics, you have more of a chance of getting killed in a motor vehicle accident: while driving to the hospital than any of the above occurring .  Nevertheless, you should be aware that they are possibilities.  In general, it is similar to taking a shower.  Everybody knows that you can slip, hit your head and get killed.  Does that mean that you should not shower again?  Nevertheless always keep in mind that statistics do not mean anything if you happen to be on the wrong side of them.  Even if a procedure has a 1 (one) in a 1,000,000 (million) chance of going wrong, it you happen to be that one..Also, keep in mind that by statistics, you have more of a chance of having something go wrong when taking medications.  Who should not have this procedure? If you are on a blood thinning medication (e.g. Coumadin, Plavix, see list of "Blood Thinners"), or if you have an active infection going on, you should not have the procedure.  If you are taking any blood thinners, please inform your physician.  How should I prepare for this procedure?  Do not eat or drink anything at least six hours prior to the procedure.  Bring a driver with you .  It cannot be a taxi.  Come accompanied by an adult that can drive you back, and that is strong enough to help you if your legs get weak or numb from the local anesthetic.  Take all of your medicines the morning of the procedure with just enough water to swallow them.  If you have diabetes, make sure that you are scheduled to have your procedure done first thing in the morning, whenever possible.  If you have diabetes, take only half of your insulin dose and notify our nurse that you have done so as soon as you arrive at the clinic.  If  you are diabetic, but only take blood sugar pills (oral hypoglycemic), then do not take them on the morning of your procedure.  You may take them after you have had the procedure.  Do not take aspirin or any aspirin-containing medications, at least eleven (11) days prior to the procedure.  They may prolong bleeding.  Wear loose fitting clothing that may be easy to take off and that you would not mind if it got stained with Betadine or blood.  Do not wear any jewelry or perfume  Remove any nail coloring.  It will interfere with some of our monitoring equipment.  NOTE: Remember that this is not meant to be interpreted as a complete list of all possible complications.  Unforeseen problems may occur.  BLOOD THINNERS The following drugs contain aspirin  or other products, which can cause increased bleeding during surgery and should not be taken for 2 weeks prior to and 1 week after surgery.  If you should need take something for relief of minor pain, you may take acetaminophen which is found in Tylenol,m Datril, Anacin-3 and Panadol. It is not blood thinner. The products listed below are.  Do not take any of the products listed below in addition to any listed on your instruction sheet.  A.P.C or A.P.C with Codeine Codeine Phosphate Capsules #3 Ibuprofen Ridaura  ABC compound Congesprin Imuran rimadil  Advil Cope Indocin Robaxisal  Alka-Seltzer Effervescent Pain Reliever and Antacid Coricidin or Coricidin-D  Indomethacin Rufen  Alka-Seltzer plus Cold Medicine Cosprin Ketoprofen S-A-C Tablets  Anacin Analgesic Tablets or Capsules Coumadin Korlgesic Salflex  Anacin Extra Strength Analgesic tablets or capsules CP-2 Tablets Lanoril Salicylate  Anaprox Cuprimine Capsules Levenox Salocol  Anexsia-D Dalteparin Magan Salsalate  Anodynos Darvon compound Magnesium Salicylate Sine-off  Ansaid Dasin Capsules Magsal Sodium Salicylate  Anturane Depen Capsules Marnal Soma  APF Arthritis pain formula Dewitt's  Pills Measurin Stanback  Argesic Dia-Gesic Meclofenamic Sulfinpyrazone  Arthritis Bayer Timed Release Aspirin Diclofenac Meclomen Sulindac  Arthritis pain formula Anacin Dicumarol Medipren Supac  Analgesic (Safety coated) Arthralgen Diffunasal Mefanamic Suprofen  Arthritis Strength Bufferin Dihydrocodeine Mepro Compound Suprol  Arthropan liquid Dopirydamole Methcarbomol with Aspirin Synalgos  ASA tablets/Enseals Disalcid Micrainin Tagament  Ascriptin Doan's Midol Talwin  Ascriptin A/D Dolene Mobidin Tanderil  Ascriptin Extra Strength Dolobid Moblgesic Ticlid  Ascriptin with Codeine Doloprin or Doloprin with Codeine Momentum Tolectin  Asperbuf Duoprin Mono-gesic Trendar  Aspergum Duradyne Motrin or Motrin IB Triminicin  Aspirin plain, buffered or enteric coated Durasal Myochrisine Trigesic  Aspirin Suppositories Easprin Nalfon Trillsate  Aspirin with Codeine Ecotrin Regular or Extra Strength Naprosyn Uracel  Atromid-S Efficin Naproxen Ursinus  Auranofin Capsules Elmiron Neocylate Vanquish  Axotal Emagrin Norgesic Verin  Azathioprine Empirin or Empirin with Codeine Normiflo Vitamin E  Azolid Emprazil Nuprin Voltaren  Bayer Aspirin plain, buffered or children's or timed BC Tablets or powders Encaprin Orgaran Warfarin Sodium  Buff-a-Comp Enoxaparin Orudis Zorpin  Buff-a-Comp with Codeine Equegesic Os-Cal-Gesic   Buffaprin Excedrin plain, buffered or Extra Strength Oxalid   Bufferin Arthritis Strength Feldene Oxphenbutazone   Bufferin plain or Extra Strength Feldene Capsules Oxycodone with Aspirin   Bufferin with Codeine Fenoprofen Fenoprofen Pabalate or Pabalate-SF   Buffets II Flogesic Panagesic   Buffinol plain or Extra Strength Florinal or Florinal with Codeine Panwarfarin   Buf-Tabs Flurbiprofen Penicillamine   Butalbital Compound Four-way cold tablets Penicillin   Butazolidin Fragmin Pepto-Bismol   Carbenicillin Geminisyn Percodan   Carna Arthritis Reliever Geopen Persantine    Carprofen Gold's salt Persistin   Chloramphenicol Goody's Phenylbutazone   Chloromycetin Haltrain Piroxlcam   Clmetidine heparin Plaquenil   Cllnoril Hyco-pap Ponstel   Clofibrate Hydroxy chloroquine Propoxyphen         Before stopping any of these medications, be sure to consult the physician who ordered them.  Some, such as Coumadin (Warfarin) are ordered to prevent or treat serious conditions such as "deep thrombosis", "pumonary embolisms", and other heart problems.  The amount of time that you may need off of the medication may also vary with the medication and the reason for which you were taking it.  If you are taking any of these medications, please make sure you notify your pain physician before you undergo any procedures.         Stellate Ganglion Block Patient Information  Description: The  stellate ganglion is part of a chain of nerves in the neck that innervate the shoulder, arm, and hand.  This chain of nerves lies beside the windpipe.  By injecting local anesthesia along the stellate ganglion, we block the nerve innervation to the arm and hand on that particular side.  Generally only sensory innervation (touch) is blocked and not motor innervation (strength).  This allows pain-free physical therapy, which is vital to many of the conditions that this nerve block helps.   After numbing the skin with local anesthesia, a small needle is passed to the stellate ganglion and the local anesthesia is again deposited.  The procedure generally takes less than one minute.  Conditions that my be helped with stellate ganglion blockade;   Reflex sympathetic dystrophy (RSD)  Postherpetic neuralgia  Diabetic neuropathy  Pain from nerve lesions or injury  Herpes zoster ( shingles  Vascular disease  Raynaud's disease  Preparation for the injection: 1. Do not eat any solid food or dairy products within 8 hours of your appointment. 2. You may drink clear liquids up to 3 hours  before appointment.  Clear liquids include water, black coffee, juice or soda.  No milk or cream please. 3. You may take your regular medication, including pain medications, with a sip of water before you appointment.  Diabetics should hold regular insulin (if taken separately) and take 1/2 normal NPH dose the morning of the procedure.  Carry some sugar containing items with you to your appointment. 4. A drive must accompany you and be prepared to drive you home after your procedure. 5. Bring all your current medications with you. 6. An IV may be inserted and sedation may be given at the discretion of the physician. 7. A blood pressure cuff, EKG and other monitors will often be applied during the procedure.  Some patients may need to have extra oxygen administered for a short period. 8. You will be asked to provide medical information, including your allergies, prior to the procedure.  We must know immediately if you are taking blood thinners (like Coumadin) or if you are allergic to IV iodine contrast (dye).  Possible side-effects   Bleeding from needle site  Blurry vision and droopy eyelid in that eye (temporary)  Stuffy nose  Temporary shortness of breath  Weakness in arm (temporary)  Arm may feel warm   Possible complications: All of these are extremely RARE   Seizures  Infection  Lung puncture  Bleeding in the neck  Nerve injury  Call if you experience:   Fever/chills  Extreme shortness of breath or inability to swallow  Redness, inflammation or drainage from the site  Weakness or numbness that last for greater than 24 hours  Please note:  If effective, we may have to do up to 3-5 blocks in a 3-4 week interval with physical therapy (depending on which condition you are being treated for).  After the procedure: You will be observed in the outpatient area and discharged home.  Please consult the discharge instructions given after the procedure for any  post-procedure questions.  If you have any questions please call [phone] [facility]

## 2015-07-25 NOTE — Progress Notes (Signed)
Safety precautions to be maintained throughout the outpatient stay will include: orient to surroundings, keep bed in low position, maintain call bell within reach at all times, provide assistance with transfer out of bed and ambulation.  

## 2015-07-25 NOTE — Progress Notes (Signed)
Subjective:    Patient ID: Cheyenne DillingJodie M Allocca, female    DOB: 07-05-1965, 50 y.o.   MRN: [id number]  HPI  The patient is a 50 year old female who returns to pain management for further evaluation and treatment of pain involving the neck entire back upper and lower extremity region. The patient is with complaint of headache which patient states the pain radiates from the back of the head to the top of the head. The patient admits to pain involving the retro-orbital region as well. There is concern regarding patient's pain being due to migraine headache as well as bilateral occipital neuralgia. We discussed patient's condition on today's visit and will consider patient for stellate ganglion block at time return appointment. Patient will undergo MRI of the brain and will undergo follow-up neurological evaluation as discussed. She has a history of pain involving the lower back and lower extremity pain regions as well. At the present time we will continue patient's medication of methadone and Neurontin. We will proceed with stellate ganglion block at time return appointment in attempt to decrease severity of headaches, minimize progression of headaches, and avoid the need for more involved treatment. Patient will undergo further neurological evaluation and general medical evaluation as discussed. All agreed to suggested treatment plan        Review of Systems     Objective:   Physical Exam  There was tenderness over the region of the splenius capitis and occipitalis musculature region a moderate degree with moderate tenderness of the cervical facet cervical paraspinal musculature region. Palpation of the acromioclavicular and glenohumeral joint regions reproduce mild discomfort. Patient appeared to be with unremarkable Spurling's maneuver. Palpation over the region of the cervical region was attends to palpation of moderate degree with tenderness of the trapezius levator scapula and rhomboid  musculature regions on of moderate degree. There were no bounding pulsations of the temporal region noted no obvious ptosis of the eyelids were noted on today's visit no apparent increased sensitivity of the sinus regions were felt to be present. There was tenderness over the region of the thoracic area thoracic facet region without crepitus of the thoracic region noted. Palpation over the lumbar paraspinal musculature and lumbar facet region was with moderate tenderness to palpation with lateral bending rotation extension and palpation of the lumbar facets reproducing moderate discomfort as well there was tenderness over the PSIS and PII S region a moderate degree straight leg raising was limited to approximately 20 without increased pain with dorsiflexion noted. No definite sensory deficit or dermatomal dystrophy detected. There was negative clonus negative Homans. DTRs were difficult to elicit abdomen nontender and no costovertebral tenderness noted           Assessment & Plan:       Migraine headache  Bilateral occipital neuralgia  Degenerative disc disease lumbar spine L3 L4 multifactorial moderate to marked spinal stenosis.  L4-L5 bulge with superimposed central disc protrusion slightly greater to the left. Indentation ventral aspect of the thecal sac slightly greater to the left. Facet degenerative changes. L5-S1 mild bulge with osteophyte with foraminal/lateral extension slightly greater on the left. Mild encroachment upon but not compression of the exiting L5 nerve roots. Facet joint degenerative changes.  Lumbar stenosis with neurogenic claudication  Lumbar radiculopathy  Lumbar facet syndrome  Sacroiliac joint dysfunction  Right upper extremity, secondary to fall (sprain versus fracture)      PLAN  Continue present medication Neurontin and methadone. NO TYLENOL Tylenol can cause severe damage to liver  kidney   Stellate ganglion block to be performed at time  return appointment  F/U PCP Dr.Aycock  for evaliation of  BP fatigue and general medical  condition. Also discussed Tylenol consumption with Dr. Letta Pate . STOP TYLENOL CONSUMPTION PLEASE  F/U surgical evaluation .Marland Kitchen Please ask receptionist and nurses the date of your neurosurgical evaluation  F/U neurological evaluation and MRI of head as scheduled  May consider radiofrequency rhizolysis or intraspinal procedures pending response to present treatment and F/U evaluation.  Patient to call Pain Management Center should patient have concerns prior to scheduled return appointment

## 2015-07-26 ENCOUNTER — Ambulatory Visit
Admission: RE | Admit: 2015-07-26 | Discharge: 2015-07-26 | Disposition: A | Discharge status: Home / Self Care | Payer: Medicare Other | Source: Ambulatory Visit | Attending: Family Medicine | Admitting: Family Medicine

## 2015-07-26 DIAGNOSIS — R29818 Other symptoms and signs involving the nervous system: Secondary | ICD-10-CM

## 2015-07-26 DIAGNOSIS — R2990 Unspecified symptoms and signs involving the nervous system: Secondary | ICD-10-CM | POA: Insufficient documentation

## 2015-07-26 DIAGNOSIS — R51 Headache: Secondary | ICD-10-CM | POA: Diagnosis present

## 2015-07-26 DIAGNOSIS — R519 Headache, unspecified: Secondary | ICD-10-CM

## 2015-07-26 MED ORDER — GADOBENATE DIMEGLUMINE 529 MG/ML IV SOLN
20.0000 mL | Freq: Once | INTRAVENOUS | Status: AC | PRN
  Administered 2015-07-26: 20 mL via INTRAVENOUS

## 2015-08-06 ENCOUNTER — Ambulatory Visit: Payer: Medicare Other | Attending: Pain Medicine | Admitting: Pain Medicine

## 2015-08-06 ENCOUNTER — Encounter: Payer: Self-pay | Admitting: Pain Medicine

## 2015-08-06 VITALS — BP 123/59 | HR 74 | Temp 97.4°F | Resp 16 | Ht 66.0 in | Wt 213.0 lb

## 2015-08-06 DIAGNOSIS — M48062 Spinal stenosis, lumbar region with neurogenic claudication: Secondary | ICD-10-CM

## 2015-08-06 DIAGNOSIS — M5136 Other intervertebral disc degeneration, lumbar region: Secondary | ICD-10-CM

## 2015-08-06 DIAGNOSIS — R51 Headache: Secondary | ICD-10-CM | POA: Insufficient documentation

## 2015-08-06 DIAGNOSIS — M47814 Spondylosis without myelopathy or radiculopathy, thoracic region: Secondary | ICD-10-CM

## 2015-08-06 DIAGNOSIS — M5481 Occipital neuralgia: Secondary | ICD-10-CM | POA: Diagnosis not present

## 2015-08-06 DIAGNOSIS — M5416 Radiculopathy, lumbar region: Secondary | ICD-10-CM

## 2015-08-06 DIAGNOSIS — G43109 Migraine with aura, not intractable, without status migrainosus: Secondary | ICD-10-CM

## 2015-08-06 DIAGNOSIS — M47816 Spondylosis without myelopathy or radiculopathy, lumbar region: Secondary | ICD-10-CM

## 2015-08-06 DIAGNOSIS — M533 Sacrococcygeal disorders, not elsewhere classified: Secondary | ICD-10-CM

## 2015-08-06 DIAGNOSIS — M5134 Other intervertebral disc degeneration, thoracic region: Secondary | ICD-10-CM

## 2015-08-06 DIAGNOSIS — G588 Other specified mononeuropathies: Secondary | ICD-10-CM

## 2015-08-06 DIAGNOSIS — M47894 Other spondylosis, thoracic region: Secondary | ICD-10-CM

## 2015-08-06 DIAGNOSIS — G43119 Migraine with aura, intractable, without status migrainosus: Secondary | ICD-10-CM

## 2015-08-06 DIAGNOSIS — M51369 Other intervertebral disc degeneration, lumbar region without mention of lumbar back pain or lower extremity pain: Secondary | ICD-10-CM

## 2015-08-06 MED ORDER — TRIAMCINOLONE ACETONIDE 40 MG/ML IJ SUSP
40.0000 mg | Freq: Once | INTRAMUSCULAR | Status: AC
  Administered 2015-08-06: 40 mg
  Filled 2015-08-06: qty 1

## 2015-08-06 MED ORDER — MIDAZOLAM HCL 5 MG/5ML IJ SOLN
5.0000 mg | Freq: Once | INTRAMUSCULAR | Status: AC
  Administered 2015-08-06: 5 mg via INTRAVENOUS
  Filled 2015-08-06: qty 5

## 2015-08-06 MED ORDER — BUPIVACAINE HCL (PF) 0.25 % IJ SOLN
30.0000 mL | Freq: Once | INTRAMUSCULAR | Status: AC
  Administered 2015-08-06: 30 mL
  Filled 2015-08-06: qty 30

## 2015-08-06 MED ORDER — FENTANYL CITRATE (PF) 100 MCG/2ML IJ SOLN
100.0000 ug | Freq: Once | INTRAMUSCULAR | Status: AC
  Administered 2015-08-06: 100 ug via INTRAVENOUS
  Filled 2015-08-06: qty 2

## 2015-08-06 MED ORDER — LACTATED RINGERS IV SOLN
1000.0000 mL | INTRAVENOUS | Status: DC
  Administered 2015-08-06: 1000 mL via INTRAVENOUS

## 2015-08-06 MED ORDER — ORPHENADRINE CITRATE 30 MG/ML IJ SOLN
60.0000 mg | Freq: Once | INTRAMUSCULAR | Status: AC
  Administered 2015-08-06: 60 mg via INTRAMUSCULAR
  Filled 2015-08-06: qty 2

## 2015-08-06 NOTE — Progress Notes (Signed)
   Subjective:    Patient ID: Cheyenne DillingJodie M Gray, female    DOB: [birth year], 50 y.o.   MRN: [id number]  HPI                                          STELLATE GANGLION BLOCK      The patient is a 50 -year-old female who returns to pain management center t for further evaluation and treatment of headache.. The patient is with pain described as aching throbbing pain involving the right side of the head more than the left side with sensitivity to light and sound and associated with nausea. There is concern regarding significant component of headaches been due to migraine headache in addition to component of occipital neuralgia Decision has been made to proceed with stellate ganglion block in attempt to decrease severity of patient's pain, decreased progression of patient's symptoms, and avoid the need for more involved treatment. The risks, benefits. and expectations of the procedure have been discussed with and explained to the patient who is with understanding and agrees to proceed with recommended interventional treatment plan.     Description Of Procedure:  Right Stellate Ganglion Block  The patient was taken to fluoroscopy suite and assumed the supine position with the head and neck and the hyperextended position. EKG, blood pressure, pulse, capnography, and pulse oximetry monitors were placed on the patient. IV Versed and IV fentanyl conscious sedation was accomplished. Betadine prep of proposed entry site was accomplished. Under fluoroscopic guidance a 22-gauge needle was inserted at the C6 cervical tubercle on the right with contact of bone of the C6 cervical tubercle. The needle was slightly withdrawn and following negative aspiration for heme and CSF, a total of 10 cc of 0.25% bupivacaine was injected incrementally. The needle was removed.  Myoneural block injection of the cervical musculature region Following Betadine prep of proposed entry site a 22-gauge needle was inserted into the  cervical paraspinal musculature region and following negative aspiration 2 cc of 0.25% bupivacaine with Norflex was injected for myoneural block injection of the cervical paraspinal musculature region 4   The patient tolerated the procedure well.   Plan   Continue present medication Neurontin and methadone  F/U PCP Dr. Astrid DivineAycoick for evaliation of  BP and general medical  condition  F/U surgical evaluation. May consider pending follow-up evaluations  F/U neurological evaluation for further assessment of  headache and general neurological condition as discussed  May consider radiofrequency rhizolysis or intraspinal procedures pending response to present treatment and F/U evaluation   Patient to call Pain Management Center should patient have concerns prior to scheduled return appointment.    Review of Systems     Objective:   Physical Exam        Assessment & Plan:

## 2015-08-06 NOTE — Progress Notes (Signed)
Patient here for procedure d/t chronic headaches.  Patient also c/o back pain that goes into legs. Patient states that she needs a [facility] Safety precautions to be maintained throughout the outpatient stay will include: orient to surroundings, keep bed in low position, maintain call bell within reach at all times, provide assistance with transfer out of bed and ambulation.

## 2015-08-06 NOTE — Patient Instructions (Addendum)
PLAN  Continue present medication Neurontin and methadone. NO TYLENOL Tylenol can cause severe damage to liver kidney   F/U Dr. Letta PateAycock for evaliation of  BP fatigue and general medical  condition. Also discussed Tylenol consumption with Dr. Letta PateAycock . STOP TYLENOL CONSUMPTION PLEASE  F/U surgical evaluation .Marland Kitchen. Please ask receptionist and nurses the date of your neurosurgical evaluation  F/U neurological evaluation and MRI of hand as scheduled  May consider radiofrequency rhizolysis or intraspinal procedures pending response to present treatment and F/U evaluation.  Patient to call Pain Management Center should patient have concerns prior to scheduled return appointment  Stellate Ganglion Block Patient Information  Description: The stellate ganglion is part of a chain of nerves in the neck that innervate the shoulder, arm, and hand.  This chain of nerves lies beside the windpipe.  By injecting local anesthesia along the stellate ganglion, we block the nerve innervation to the arm and hand on that particular side.  Generally only sensory innervation (touch) is blocked and not motor innervation (strength).  This allows pain-free physical therapy, which is vital to many of the conditions that this nerve block helps.   After numbing the skin with local anesthesia, a small needle is passed to the stellate ganglion and the local anesthesia is again deposited.  The procedure generally takes less than one minute.  Conditions that my be helped with stellate ganglion blockade;   Reflex sympathetic dystrophy (RSD)  Postherpetic neuralgia  Diabetic neuropathy  Pain from nerve lesions or injury  Herpes zoster ( shingles  Vascular disease  Raynaud's disease  Preparation for the injection: 1. Do not eat any solid food or dairy products within 8 hours of your appointment. 2. You may drink clear liquids up to 3 hours before appointment.  Clear liquids include water, black coffee, juice or soda.   No milk or cream please. 3. You may take your regular medication, including pain medications, with a sip of water before you appointment.  Diabetics should hold regular insulin (if taken separately) and take 1/2 normal NPH dose the morning of the procedure.  Carry some sugar containing items with you to your appointment. 4. A drive must accompany you and be prepared to drive you home after your procedure. 5. Bring all your current medications with you. 6. An IV may be inserted and sedation may be given at the discretion of the physician. 7. A blood pressure cuff, EKG and other monitors will often be applied during the procedure.  Some patients may need to have extra oxygen administered for a short period. 8. You will be asked to provide medical information, including your allergies, prior to the procedure.  We must know immediately if you are taking blood thinners (like Coumadin) or if you are allergic to IV iodine contrast (dye).  Possible side-effects   Bleeding from needle site  Blurry vision and droopy eyelid in that eye (temporary)  Stuffy nose  Temporary shortness of breath  Weakness in arm (temporary)  Arm may feel warm   Possible complications: All of these are extremely RARE   Seizures  Infection  Lung puncture  Bleeding in the neck  Nerve injury  Call if you experience:   Fever/chills  Extreme shortness of breath or inability to swallow  Redness, inflammation or drainage from the site  Weakness or numbness that last for greater than 24 hours  Please note:  If effective, we may have to do up to 3-5 blocks in a 3-4 week interval with physical  therapy (depending on which condition you are being treated for).  After the procedure: You will be observed in the outpatient area and discharged home.  Please consult the discharge instructions given after the procedure for any post-procedure questions.  If you have any questions please call [phone] [facility]    Pain Management Discharge Instructions  General Discharge Instructions :  If you need to reach your doctor call: Monday-Friday 8:00 am - 4:00 pm at [phone] or toll free [phone].  After clinic hours [phone] to have operator reach doctor.  Bring all of your medication bottles to all your appointments in the pain clinic.  To cancel or reschedule your appointment with Pain Management please remember to call 24 hours in advance to avoid a fee.  Refer to the educational materials which you have been given on: General Risks, I had my Procedure. Discharge Instructions, Post Sedation.  Post Procedure Instructions:  The drugs you were given will stay in your system until tomorrow, so for the next 24 hours you should not drive, make any legal decisions or drink any alcoholic beverages.  You may eat anything you prefer, but it is better to start with liquids then soups and crackers, and gradually work up to solid foods.  Please notify your doctor immediately if you have any unusual bleeding, trouble breathing or pain that is not related to your normal pain.  Depending on the type of procedure that was done, some parts of your body may feel week and/or numb.  This usually clears up by tonight or the next day.  Walk with the use of an assistive device or accompanied by an adult for the 24 hours.  You may use ice on the affected area for the first 24 hours.  Put ice in a Ziploc bag and cover with a towel and place against area 15 minutes on 15 minutes off.  You may switch to heat after 24 hours.

## 2015-08-07 ENCOUNTER — Telehealth: Payer: Self-pay | Admitting: *Deleted

## 2015-08-07 NOTE — Telephone Encounter (Signed)
No problems, just soreness.

## 2015-08-08 DIAGNOSIS — G4440 Drug-induced headache, not elsewhere classified, not intractable: Secondary | ICD-10-CM | POA: Insufficient documentation

## 2015-08-08 DIAGNOSIS — G44221 Chronic tension-type headache, intractable: Secondary | ICD-10-CM | POA: Insufficient documentation

## 2015-08-08 DIAGNOSIS — R519 Headache, unspecified: Secondary | ICD-10-CM | POA: Insufficient documentation

## 2015-08-21 ENCOUNTER — Ambulatory Visit: Payer: Medicare Other | Admitting: Internal Medicine

## 2015-08-21 ENCOUNTER — Inpatient Hospital Stay: Payer: Medicare Other

## 2015-08-21 ENCOUNTER — Inpatient Hospital Stay: Payer: Medicare Other | Attending: Family Medicine | Admitting: *Deleted

## 2015-08-21 ENCOUNTER — Inpatient Hospital Stay (HOSPITAL_BASED_OUTPATIENT_CLINIC_OR_DEPARTMENT_OTHER): Payer: Medicare Other | Admitting: Family Medicine

## 2015-08-21 VITALS — BP 142/95 | HR 79 | Temp 97.0°F | Ht 63.0 in | Wt 232.4 lb

## 2015-08-21 DIAGNOSIS — J45909 Unspecified asthma, uncomplicated: Secondary | ICD-10-CM

## 2015-08-21 DIAGNOSIS — M129 Arthropathy, unspecified: Secondary | ICD-10-CM

## 2015-08-21 DIAGNOSIS — R002 Palpitations: Secondary | ICD-10-CM | POA: Insufficient documentation

## 2015-08-21 DIAGNOSIS — D509 Iron deficiency anemia, unspecified: Secondary | ICD-10-CM

## 2015-08-21 DIAGNOSIS — Z79899 Other long term (current) drug therapy: Secondary | ICD-10-CM | POA: Insufficient documentation

## 2015-08-21 DIAGNOSIS — E538 Deficiency of other specified B group vitamins: Secondary | ICD-10-CM | POA: Diagnosis not present

## 2015-08-21 DIAGNOSIS — Z809 Family history of malignant neoplasm, unspecified: Secondary | ICD-10-CM | POA: Insufficient documentation

## 2015-08-21 DIAGNOSIS — D559 Anemia due to enzyme disorder, unspecified: Secondary | ICD-10-CM

## 2015-08-21 DIAGNOSIS — Z8669 Personal history of other diseases of the nervous system and sense organs: Secondary | ICD-10-CM | POA: Diagnosis not present

## 2015-08-21 DIAGNOSIS — M1990 Unspecified osteoarthritis, unspecified site: Secondary | ICD-10-CM

## 2015-08-21 DIAGNOSIS — F329 Major depressive disorder, single episode, unspecified: Secondary | ICD-10-CM | POA: Insufficient documentation

## 2015-08-21 DIAGNOSIS — G8929 Other chronic pain: Secondary | ICD-10-CM

## 2015-08-21 DIAGNOSIS — E669 Obesity, unspecified: Secondary | ICD-10-CM

## 2015-08-21 DIAGNOSIS — R5383 Other fatigue: Secondary | ICD-10-CM | POA: Insufficient documentation

## 2015-08-21 DIAGNOSIS — M549 Dorsalgia, unspecified: Secondary | ICD-10-CM | POA: Insufficient documentation

## 2015-08-21 DIAGNOSIS — N920 Excessive and frequent menstruation with regular cycle: Secondary | ICD-10-CM | POA: Insufficient documentation

## 2015-08-21 DIAGNOSIS — Z9884 Bariatric surgery status: Secondary | ICD-10-CM | POA: Diagnosis not present

## 2015-08-21 DIAGNOSIS — E559 Vitamin D deficiency, unspecified: Secondary | ICD-10-CM | POA: Diagnosis not present

## 2015-08-21 LAB — IRON AND TIBC
IRON: 105 ug/dL (ref 28–170)
SATURATION RATIOS: 27 % (ref 10.4–31.8)
TIBC: 384 ug/dL (ref 250–450)
UIBC: 279 ug/dL

## 2015-08-21 LAB — HEMOGLOBIN: HEMOGLOBIN: 13.2 g/dL (ref 12.0–16.0)

## 2015-08-21 LAB — FERRITIN: Ferritin: 38 ng/mL (ref 11–307)

## 2015-08-21 MED ORDER — SODIUM CHLORIDE 0.9 % IV SOLN
100.0000 mg | Freq: Once | INTRAVENOUS | Status: AC
  Administered 2015-08-21: 100 mg via INTRAVENOUS
  Filled 2015-08-21: qty 5

## 2015-08-21 MED ORDER — SODIUM CHLORIDE 0.9 % IV SOLN
INTRAVENOUS | Status: DC
  Administered 2015-08-21: 15:00:00 via INTRAVENOUS
  Filled 2015-08-21: qty 1000

## 2015-08-21 NOTE — Progress Notes (Signed)
[facility]  [phone] [phone]     ID: Cheyenne Gray OB: 1965/11/04  MR#: [id number]  VHQ#:[id number]  Patient Care Team: Emogene Morgan, MD as PCP - General (Family Medicine) Emogene Morgan, MD as Referring Physician (Family Medicine)  CHIEF COMPLAINT/DIAGNOSIS:  Iron-deficiency anemia. Intolerant to oral iron supplementation. Also has a history of gastric bypass in 2001.  On parenteral iron therapy for recurrent iron deficiency.   HISTORY OF PRESENT ILLNESS:  Patient returns for continued hematology followup. She was last seen in July 2016 by Dr. Sherrlyn Hock. She typically receives maintenance Venofer infusions every 3 months but has not had an infusion since October or November 2016. Currently states that she has fatigue on exertion, otherwise remains physically activity. Otherwise, no new dyspnea, palpitation, angina, dizziness, orthopnea, or PND. Denies any new bleeding symptoms including bright red blood in stools or hematuria. States that sometimes menstrual periods are heavier but otherwise mostly regular. No new bone pains. Appetite is good, no unintentional weight loss. No new paresthesias in extremities.   REVIEW OF SYSTEMS:   Review of Systems  Constitutional: Negative for fever, chills, weight loss, malaise/fatigue and diaphoresis.  HENT: Negative.   Eyes: Negative.   Respiratory: Negative for cough, hemoptysis, sputum production, shortness of breath and wheezing.   Cardiovascular: Negative for chest pain, palpitations, orthopnea, claudication, leg swelling and PND.  Gastrointestinal: Negative for heartburn, nausea, vomiting, abdominal pain, diarrhea, constipation, blood in stool and melena.  Genitourinary: Negative.   Musculoskeletal: Negative.   Skin: Negative.   Neurological: Negative for dizziness, tingling, focal weakness, seizures and weakness.  Endo/Heme/Allergies: Does not bruise/bleed easily.  Psychiatric/Behavioral: Negative for  depression. The patient is not nervous/anxious and does not have insomnia.    As in HPI above. In addition, no fevers or sweats. No new headaches or focal weakness.  No new sore throat, cough, sputum, hemoptysis or chest pain. No dizziness or palpitation. No abdominal pain, constipation, diarrhea, dysuria or hematuria. No new skin rash or bleeding symptoms. No new paresthesias in extremities.    PAST MEDICAL HISTORY: Reviewed. Past Medical History  Diagnosis Date  . Chronic pain     a. on methadone  . DJD (degenerative joint disease)   . Iron deficiency anemia   . Long QT interval   . Syncope and collapse   . Iron deficiency   . History of echocardiogram     a. echo 2014: EF 65-70%, nl WM, mildly dilated LA, PASP nl  . Arthritis   . Asthma   . Obesity   . Migraine   . Psoriasis   . Chronic back pain   . DJD (degenerative joint disease), multiple sites   . B12 deficiency   . Vitamin D deficiency   . Depression   . History of shingles   . Palpitations     a. 24 hour Holter: NSR, sinus brady down to 48, occasional PVCs & couplets, 8 beats NSVT; b. 30 day event monitor 2015: NSR with rare PVC  . Hand, foot and mouth disease 2016  . Methadone use (HCC)     managed by Dr. Metta Clines  . Chronic headaches     PAST SURGICAL HISTORY: Reviewed. Past Surgical History  Procedure Laterality Date  . Bariatric surgery  2001  . Cholecystectomy  2001  . Gastroplasty    . Gastric bypass    . Gallbladder surgery      FAMILY HISTORY: Reviewed. Family History  Problem Relation Age of Onset  .  Heart attack Mother   . Cancer Mother   . Heart attack Father 98    MI  . Heart attack Brother   . Heart disease Brother   . Heart attack Maternal Grandmother   . Cancer Maternal Grandmother   . Cancer Maternal Uncle     SOCIAL HISTORY: Reviewed. Social History  Substance Use Topics  . Smoking status: Never Smoker   . Smokeless tobacco: Not on file  . Alcohol Use: No    Allergies    Allergen Reactions  . Penicillins     REACTION: Rash, Hives, S.O.B.    Current Outpatient Prescriptions  Medication Sig Dispense Refill  . albuterol (PROVENTIL HFA;VENTOLIN HFA) 108 (90 BASE) MCG/ACT inhaler Inhale 2 puffs into the lungs as needed for wheezing.    . cyclobenzaprine (FLEXERIL) 10 MG tablet Take 10 mg by mouth at bedtime as needed for muscle spasms. Reported on 07/25/2015    . gabapentin (NEURONTIN) 400 MG capsule Limit 2 tablets in the a.m. and midday and 3 tablets each evening 210 capsule 0  . methadone (DOLOPHINE) 10 MG tablet Limit 1-2 tablets by mouth 2-3 times per day if tolerated (Patient taking differently: 20 mg every 8 (eight) hours. Limit 1-2 tablets by mouth 2-3 times per day if tolerated) 180 tablet 0  . nortriptyline (PAMELOR) 10 MG capsule      Current Facility-Administered Medications  Medication Dose Route Frequency Provider Last Rate Last Dose  . bupivacaine (PF) (MARCAINE) 0.25 % injection 30 mL  30 mL Other Once Ewing Schlein, MD      . bupivacaine (PF) (MARCAINE) 0.25 % injection 30 mL  30 mL Other Once Ewing Schlein, MD      . fentaNYL (SUBLIMAZE) injection 100 mcg  100 mcg Intravenous Once Ewing Schlein, MD      . fentaNYL (SUBLIMAZE) injection 100 mcg  100 mcg Intravenous Once Ewing Schlein, MD      . lactated ringers infusion 1,000 mL  1,000 mL Intravenous Continuous Ewing Schlein, MD      . lactated ringers infusion 1,000 mL  1,000 mL Intravenous Continuous Ewing Schlein, MD      . lactated ringers infusion 1,000 mL  1,000 mL Intravenous Continuous Ewing Schlein, MD 125 mL/hr at 08/06/15 1002 1,000 mL at 08/06/15 1002  . lidocaine (PF) (XYLOCAINE) 1 % injection 10 mL  10 mL Subcutaneous Once Ewing Schlein, MD      . midazolam (VERSED) 5 MG/5ML injection 5 mg  5 mg Intravenous Once Ewing Schlein, MD      . orphenadrine (NORFLEX) injection 60 mg  60 mg Intramuscular Once Ewing Schlein, MD      . orphenadrine (NORFLEX) injection 60 mg  60 mg  Intramuscular Once Ewing Schlein, MD      . triamcinolone acetonide (KENALOG-40) injection 40 mg  40 mg Other Once Ewing Schlein, MD      . triamcinolone acetonide (KENALOG-40) injection 40 mg  40 mg Other Once Ewing Schlein, MD        PHYSICAL EXAM: Filed Vitals:   08/21/15 1417  BP: 142/95  Pulse: 79  Temp: 97 F (36.1 C)     Body mass index is 41.17 kg/(m^2).       GENERAL: Patient is alert and oriented and in no acute distress. There is no icterus or pallor. HEENT: EOMs intact. No cervical lymphadenopathy. CVS: S1S2, regular LUNGS: Bilaterally clear to auscultation, no rhonchi. ABDOMEN: Soft, nontender.   EXTREMITIES: No pedal edema.   LAB RESULTS:  Serum Fe 76, TIBC 275, iron sat 28%, ferritin 45.    Component Value Date/Time   NA 138 01/08/2015 1159   K 3.8 01/08/2015 1159   CL 104 01/08/2015 1159   CO2 25 01/08/2015 1159   GLUCOSE 92 01/08/2015 1159   BUN 11 01/08/2015 1159   CREATININE 0.78 01/08/2015 1159   CALCIUM 9.5 01/08/2015 1159   PROT 6.8 01/30/2015 0909   PROT 6.4 05/04/2013 1556   ALBUMIN 3.5 01/30/2015 0909   ALBUMIN 3.1* 05/04/2013 1556   AST 72* 01/30/2015 0909   AST 38* 05/04/2013 1556   ALT 37 01/30/2015 0909   ALT 39 05/04/2013 1556   ALKPHOS 97 01/30/2015 0909   ALKPHOS 84 05/04/2013 1556   BILITOT 0.3 01/30/2015 0909   BILITOT 0.3 05/04/2013 1556   GFRNONAA >60 01/08/2015 1159   GFRAA >60 01/08/2015 1159   Lab Results  Component Value Date   WBC 7.3 01/08/2015   NEUTROABS 3.4 09/19/2014   HGB 13.2 08/21/2015   HCT 43.0 01/08/2015   MCV 92.3 01/08/2015   PLT 217 01/08/2015     ASSESSMENT / PLAN:   1. Recurrent iron-deficiency anemia. Intolerant to oral iron supplementation. Also has a history of gastric bypass in 2001.  On parenteral iron therapy intermittently since 2010 -  Reviewed labs from today and d/w patient. Patient clinically doing steady except for fatigue on physical exertion. She has been on maintenance dose of IV  Venofer 100 mg but has not received an infusion since October or November 2016. Unsure as to why. Labs shows Hb remains normal. Plan is to continue on maintenance dose of Venofer 100 mg IV once every 3 months. Will monitor CBC and iron study in 3 months. Have explained that she will need large doses of IV Venofer if she develops recurrent iron deficiency. We will schedule her next M.D. follow-up, lab and infusion in approximately 6 months.  In between visits, she was advised to call or come to ER in case of any progressive anemia symptoms or acute sickness. She is agreeable to this plan.  Dr. Orlie DakinFinnegan was available for consultation and review of plan of care for this patient.  Loann QuillLeslie F Sahily Biddle, NP   08/21/2015 2:48 PM

## 2015-08-21 NOTE — Progress Notes (Signed)
Patient here for follow up. She has been feeling extremely tired.

## 2015-08-22 ENCOUNTER — Ambulatory Visit: Payer: Medicare Other | Attending: Pain Medicine | Admitting: Pain Medicine

## 2015-08-22 ENCOUNTER — Encounter: Payer: Self-pay | Admitting: Pain Medicine

## 2015-08-22 VITALS — BP 139/85 | HR 77 | Temp 97.9°F | Resp 16 | Ht 64.0 in | Wt 230.0 lb

## 2015-08-22 DIAGNOSIS — M533 Sacrococcygeal disorders, not elsewhere classified: Secondary | ICD-10-CM

## 2015-08-22 DIAGNOSIS — G43119 Migraine with aura, intractable, without status migrainosus: Secondary | ICD-10-CM

## 2015-08-22 DIAGNOSIS — M5116 Intervertebral disc disorders with radiculopathy, lumbar region: Secondary | ICD-10-CM | POA: Insufficient documentation

## 2015-08-22 DIAGNOSIS — M4806 Spinal stenosis, lumbar region: Secondary | ICD-10-CM | POA: Diagnosis not present

## 2015-08-22 DIAGNOSIS — M5416 Radiculopathy, lumbar region: Secondary | ICD-10-CM

## 2015-08-22 DIAGNOSIS — G43909 Migraine, unspecified, not intractable, without status migrainosus: Secondary | ICD-10-CM | POA: Diagnosis not present

## 2015-08-22 DIAGNOSIS — M5481 Occipital neuralgia: Secondary | ICD-10-CM | POA: Diagnosis not present

## 2015-08-22 DIAGNOSIS — M2578 Osteophyte, vertebrae: Secondary | ICD-10-CM | POA: Diagnosis not present

## 2015-08-22 DIAGNOSIS — M5136 Other intervertebral disc degeneration, lumbar region: Secondary | ICD-10-CM

## 2015-08-22 DIAGNOSIS — M47814 Spondylosis without myelopathy or radiculopathy, thoracic region: Secondary | ICD-10-CM

## 2015-08-22 DIAGNOSIS — G588 Other specified mononeuropathies: Secondary | ICD-10-CM

## 2015-08-22 DIAGNOSIS — G43109 Migraine with aura, not intractable, without status migrainosus: Secondary | ICD-10-CM

## 2015-08-22 DIAGNOSIS — M5126 Other intervertebral disc displacement, lumbar region: Secondary | ICD-10-CM | POA: Insufficient documentation

## 2015-08-22 DIAGNOSIS — M51369 Other intervertebral disc degeneration, lumbar region without mention of lumbar back pain or lower extremity pain: Secondary | ICD-10-CM

## 2015-08-22 DIAGNOSIS — M48062 Spinal stenosis, lumbar region with neurogenic claudication: Secondary | ICD-10-CM

## 2015-08-22 DIAGNOSIS — M47894 Other spondylosis, thoracic region: Secondary | ICD-10-CM

## 2015-08-22 DIAGNOSIS — M5134 Other intervertebral disc degeneration, thoracic region: Secondary | ICD-10-CM

## 2015-08-22 DIAGNOSIS — M47816 Spondylosis without myelopathy or radiculopathy, lumbar region: Secondary | ICD-10-CM

## 2015-08-22 DIAGNOSIS — R51 Headache: Secondary | ICD-10-CM | POA: Diagnosis present

## 2015-08-22 MED ORDER — GABAPENTIN 400 MG PO CAPS: ORAL_CAPSULE | ORAL | Status: DC

## 2015-08-22 MED ORDER — METHADONE HCL 10 MG PO TABS: ORAL_TABLET | ORAL | Status: DC

## 2015-08-22 NOTE — Progress Notes (Signed)
Patient here for medication management Safety precautions to be maintained throughout the outpatient stay will include: orient to surroundings, keep bed in low position, maintain call bell within reach at all times, provide assistance with transfer out of bed and ambulation.  

## 2015-08-22 NOTE — Progress Notes (Signed)
Subjective:    Patient ID: Cheyenne Gray, female    DOB: 09-25-1965, 50 y.o.   MRN: [id number]  HPI  The patient is a 50 year old female who returns to pain management for further evaluation and treatment of headaches as well as pain involving the neck entire back upper and lower extremity regions. The patient stated that the stellate ganglion block didn't provide rather significant relief of her headache pain. We discussed patient's condition and patient recently began taking nortriptyline prescribed by neurologist. The patient will continue to follow-up with neurologist and we will proceed with stellate ganglion block at time return appointment in attempt to decrease severity of headache, minimize progression of symptoms, and avoid the need for more involved treatment. The patient denies any trauma change in events of daily living the call significant change in symptomatology. The patient continues to be with pain involving the mid and lower back region of lesser degree and continues Neurontin and methadone. We will consider additional modifications of treatment regimen pending response at the present treatment and follow-up evaluation. All agreed to suggested treatment plan  Review of Systems     Objective:   Physical Exam  Was tenderness of the splenius capitis and occipitalis regions palpation which reproduces pain of moderate to moderately severe degree. No bounding pulsations of the temporal region were noted and no tenderness to palpation of the sinuses was noted . No excessive tends to palpation of the temporomandibular joint region was noted.  Palpation of the cervical facet cervical paraspinal musculatures reproduce moderate discomfort. There was tenderness of the acromial clavicular and glenohumeral joint region a moderate degree and patient appeared to be with unremarkable Spurling's maneuver. Palpation over the thoracic region thoracic facet region was attends to palpation of  moderate degree without crepitus of the thoracic region noted. Palpation over the lumbar paraspinal musculature region lumbar facet region was with moderate tenderness to palpation with moderate tenderness over the PSIS and PII S region as well as the gluteal and piriformis muscles regions. There was moderate tenderness along the greater trochanteric region iliotibial band region. Straight leg raise was tolerates approximately 30 without increased pain with dorsiflexion noted. No sensory deficit or dermatomal distribution detected. There was negative clonus negative Homans. Abdomen nontender with no costovertebral tenderness noted         Assessment & Plan:       Migraine headache  Bilateral occipital neuralgia  Degenerative disc disease lumbar spine L3 L4 multifactorial moderate to marked spinal stenosis.  L4-L5 bulge with superimposed central disc protrusion slightly greater to the left. Indentation ventral aspect of the thecal sac slightly greater to the left. Facet degenerative changes. L5-S1 mild bulge with osteophyte with foraminal/lateral extension slightly greater on the left. Mild encroachment upon but not compression of the exiting L5 nerve roots. Facet joint degenerative changes.  Lumbar stenosis with neurogenic claudication  Lumbar radiculopathy  Lumbar facet syndrome  Sacroiliac joint dysfunction  Right upper extremity, secondary to fall (sprain versus fracture) Resolved       PLAN  Continue present medication Neurontin and methadone. Continue nortriptyline as per neurologist as discussed   NO TYLENOL Tylenol can cause severe damage to liver and kidney  Stellate ganglion block to be performed at time of return appointment   F/U PCP Dr.Aycock  for evaliation of  BP fatigue and general medical  condition. Also discuss Tylenol consumption with Dr. Letta Pate . STOP TYLENOL CONSUMPTION PLEASE  F/U surgical evaluation .Marland Kitchen Please ask receptionist and nurses the date  of your neurosurgical evaluation  F/U neurological evaluation for further assessment of headaches as well as for general neurological evaluation as discussed patient will follow-up with neurologist to discuss patient's response to nortriptyline  May consider radiofrequency rhizolysis or intraspinal procedures pending response to present treatment and F/U evaluation.  Patient to call [facility] should patient have concerns prior to scheduled return appointment

## 2015-08-22 NOTE — Patient Instructions (Addendum)
PLAN  Continue present medication Neurontin and methadone. Continue nortriptyline as per neurologist as discussed   NO TYLENOL Tylenol can cause severe damage to liver and kidney  Stellate ganglion block to be performed at time of return appointment   F/U PCP Dr.Aycock  for evaliation of  BP fatigue and general medical  condition. Also discuss Tylenol consumption with Dr. Letta Pate . STOP TYLENOL CONSUMPTION PLEASE  F/U surgical evaluation .Marland Kitchen Please ask receptionist and nurses the date of your neurosurgical evaluation  F/U neurological evaluation for further assessment of headaches as well as for general neurological evaluation as discussed   May consider radiofrequency rhizolysis or intraspinal procedures pending response to present treatment and F/U evaluation.  Patient to call [facility] Management Center should patient have concerns prior to scheduled return appointmentStellate Ganglion Block Patient Information  Description: The stellate ganglion is part of a chain of nerves in the neck that innervate the shoulder, arm, and hand.  This chain of nerves lies beside the windpipe.  By injecting local anesthesia along the stellate ganglion, we block the nerve innervation to the arm and hand on that particular side.  Generally only sensory innervation (touch) is blocked and not motor innervation (strength).  This allows pain-free physical therapy, which is vital to many of the conditions that this nerve block helps.   After numbing the skin with local anesthesia, a small needle is passed to the stellate ganglion and the local anesthesia is again deposited.  The procedure generally takes less than one minute.  Conditions that my be helped with stellate ganglion blockade;   Reflex sympathetic dystrophy (RSD)  Postherpetic neuralgia  Diabetic neuropathy  Pain from nerve lesions or injury  Herpes zoster ( shingles  Vascular disease  Raynaud's disease  Preparation for the injection: 1. Do not  eat any solid food or dairy products within 8 hours of your appointment. 2. You may drink clear liquids up to 3 hours before appointment.  Clear liquids include water, black coffee, juice or soda.  No milk or cream please. 3. You may take your regular medication, including pain medications, with a sip of water before you appointment.  Diabetics should hold regular insulin (if taken separately) and take 1/2 normal NPH dose the morning of the procedure.  Carry some sugar containing items with you to your appointment. 4. A drive must accompany you and be prepared to drive you home after your procedure. 5. Bring all your current medications with you. 6. An IV may be inserted and sedation may be given at the discretion of the physician. 7. A blood pressure cuff, EKG and other monitors will often be applied during the procedure.  Some patients may need to have extra oxygen administered for a short period. 8. You will be asked to provide medical information, including your allergies, prior to the procedure.  We must know immediately if you are taking blood thinners (like Coumadin) or if you are allergic to IV iodine contrast (dye).  Possible side-effects   Bleeding from needle site  Blurry vision and droopy eyelid in that eye (temporary)  Stuffy nose  Temporary shortness of breath  Weakness in arm (temporary)  Arm may feel warm   Possible complications: All of these are extremely RARE   Seizures  Infection  Lung puncture  Bleeding in the neck  Nerve injury  Call if you experience:   Fever/chills  Extreme shortness of breath or inability to swallow  Redness, inflammation or drainage from the site  Weakness or  numbness that last for greater than 24 hours  Please note:  If effective, we may have to do up to 3-5 blocks in a 3-4 week interval with physical therapy (depending on which condition you are being treated for).  After the procedure: You will be observed in the  outpatient area and discharged home.  Please consult the discharge instructions given after the procedure for any post-procedure questions.  If you have any questions please call [phone] [facility]    GENERAL RISKS AND COMPLICATIONS  What are the risk, side effects and possible complications? Generally speaking, most procedures are safe.  However, with any procedure there are risks, side effects, and the possibility of complications.  The risks and complications are dependent upon the sites that are lesioned, or the type of nerve block to be performed.  The closer the procedure is to the spine, the more serious the risks are.  Great care is taken when placing the radio frequency needles, block needles or lesioning probes, but sometimes complications can occur. 1. Infection: Any time there is an injection through the skin, there is a risk of infection.  This is why sterile conditions are used for these blocks.  There are four possible types of infection. 1. Localized skin infection. 2. Central Nervous System Infection-This can be in the form of Meningitis, which can be deadly. 3. Epidural Infections-This can be in the form of an epidural abscess, which can cause pressure inside of the spine, causing compression of the spinal cord with subsequent paralysis. This would require an emergency surgery to decompress, and there are no guarantees that the patient would recover from the paralysis. 4. Discitis-This is an infection of the intervertebral discs.  It occurs in about 1% of discography procedures.  It is difficult to treat and it may lead to surgery.        2. Pain: the needles have to go through skin and soft tissues, will cause soreness.       3. Damage to internal structures:  The nerves to be lesioned may be near blood vessels or    other nerves which can be potentially damaged.       4. Bleeding: Bleeding is more common if the patient is taking blood  thinners such as  aspirin, Coumadin, Ticiid, Plavix, etc., or if he/she have some genetic predisposition  such as hemophilia. Bleeding into the spinal canal can cause compression of the spinal  cord with subsequent paralysis.  This would require an emergency surgery to  decompress and there are no guarantees that the patient would recover from the  paralysis.       5. Pneumothorax:  Puncturing of a lung is a possibility, every time a needle is introduced in  the area of the chest or upper back.  Pneumothorax refers to free air around the  collapsed lung(s), inside of the thoracic cavity (chest cavity).  Another two possible  complications related to a similar event would include: Hemothorax and Chylothorax.   These are variations of the Pneumothorax, where instead of air around the collapsed  lung(s), you may have blood or chyle, respectively.       6. Spinal headaches: They may occur with any procedures in the area of the spine.       7. Persistent CSF (Cerebro-Spinal Fluid) leakage: This is a rare problem, but may occur  with prolonged intrathecal or epidural catheters either due to the formation of a fistulous  track  or a dural tear.       8. Nerve damage: By working so close to the spinal cord, there is always a possibility of  nerve damage, which could be as serious as a permanent spinal cord injury with  paralysis.       9. Death:  Although rare, severe deadly allergic reactions known as "Anaphylactic  reaction" can occur to any of the medications used.      10. Worsening of the symptoms:  We can always make thing worse.  What are the chances of something like this happening? Chances of any of this occuring are extremely low.  By statistics, you have more of a chance of getting killed in a motor vehicle accident: while driving to the hospital than any of the above occurring .  Nevertheless, you should be aware that they are possibilities.  In general, it is similar to taking a shower.  Everybody knows  that you can slip, hit your head and get killed.  Does that mean that you should not shower again?  Nevertheless always keep in mind that statistics do not mean anything if you happen to be on the wrong side of them.  Even if a procedure has a 1 (one) in a 1,000,000 (million) chance of going wrong, it you happen to be that one..Also, keep in mind that by statistics, you have more of a chance of having something go wrong when taking medications.  Who should not have this procedure? If you are on a blood thinning medication (e.g. Coumadin, Plavix, see list of "Blood Thinners"), or if you have an active infection going on, you should not have the procedure.  If you are taking any blood thinners, please inform your physician.  How should I prepare for this procedure?  Do not eat or drink anything at least six hours prior to the procedure.  Bring a driver with you .  It cannot be a taxi.  Come accompanied by an adult that can drive you back, and that is strong enough to help you if your legs get weak or numb from the local anesthetic.  Take all of your medicines the morning of the procedure with just enough water to swallow them.  If you have diabetes, make sure that you are scheduled to have your procedure done first thing in the morning, whenever possible.  If you have diabetes, take only half of your insulin dose and notify our nurse that you have done so as soon as you arrive at the clinic.  If you are diabetic, but only take blood sugar pills (oral hypoglycemic), then do not take them on the morning of your procedure.  You may take them after you have had the procedure.  Do not take aspirin or any aspirin-containing medications, at least eleven (11) days prior to the procedure.  They may prolong bleeding.  Wear loose fitting clothing that may be easy to take off and that you would not mind if it got stained with Betadine or blood.  Do not wear any jewelry or perfume  Remove any nail  coloring.  It will interfere with some of our monitoring equipment.  NOTE: Remember that this is not meant to be interpreted as a complete list of all possible complications.  Unforeseen problems may occur.  BLOOD THINNERS The following drugs contain aspirin or other products, which can cause increased bleeding during surgery and should not be taken for 2 weeks prior to and 1 week after surgery.  If you should need take something for relief of minor pain, you may take acetaminophen which is found in Tylenol,m Datril, Anacin-3 and Panadol. It is not blood thinner. The products listed below are.  Do not take any of the products listed below in addition to any listed on your instruction sheet.  A.P.C or A.P.C with Codeine Codeine Phosphate Capsules #3 Ibuprofen Ridaura  ABC compound Congesprin Imuran rimadil  Advil Cope Indocin Robaxisal  Alka-Seltzer Effervescent Pain Reliever and Antacid Coricidin or Coricidin-D  Indomethacin Rufen  Alka-Seltzer plus Cold Medicine Cosprin Ketoprofen S-A-C Tablets  Anacin Analgesic Tablets or Capsules Coumadin Korlgesic Salflex  Anacin Extra Strength Analgesic tablets or capsules CP-2 Tablets Lanoril Salicylate  Anaprox Cuprimine Capsules Levenox Salocol  Anexsia-D Dalteparin Magan Salsalate  Anodynos Darvon compound Magnesium Salicylate Sine-off  Ansaid Dasin Capsules Magsal Sodium Salicylate  Anturane Depen Capsules Marnal Soma  APF Arthritis pain formula Dewitt's Pills Measurin Stanback  Argesic Dia-Gesic Meclofenamic Sulfinpyrazone  Arthritis Bayer Timed Release Aspirin Diclofenac Meclomen Sulindac  Arthritis pain formula Anacin Dicumarol Medipren Supac  Analgesic (Safety coated) Arthralgen Diffunasal Mefanamic Suprofen  Arthritis Strength Bufferin Dihydrocodeine Mepro Compound Suprol  Arthropan liquid Dopirydamole Methcarbomol with Aspirin Synalgos  ASA tablets/Enseals Disalcid Micrainin Tagament  Ascriptin Doan's Midol Talwin  Ascriptin A/D Dolene  Mobidin Tanderil  Ascriptin Extra Strength Dolobid Moblgesic Ticlid  Ascriptin with Codeine Doloprin or Doloprin with Codeine Momentum Tolectin  Asperbuf Duoprin Mono-gesic Trendar  Aspergum Duradyne Motrin or Motrin IB Triminicin  Aspirin plain, buffered or enteric coated Durasal Myochrisine Trigesic  Aspirin Suppositories Easprin Nalfon Trillsate  Aspirin with Codeine Ecotrin Regular or Extra Strength Naprosyn Uracel  Atromid-S Efficin Naproxen Ursinus  Auranofin Capsules Elmiron Neocylate Vanquish  Axotal Emagrin Norgesic Verin  Azathioprine Empirin or Empirin with Codeine Normiflo Vitamin E  Azolid Emprazil Nuprin Voltaren  Bayer Aspirin plain, buffered or children's or timed BC Tablets or powders Encaprin Orgaran Warfarin Sodium  Buff-a-Comp Enoxaparin Orudis Zorpin  Buff-a-Comp with Codeine Equegesic Os-Cal-Gesic   Buffaprin Excedrin plain, buffered or Extra Strength Oxalid   Bufferin Arthritis Strength Feldene Oxphenbutazone   Bufferin plain or Extra Strength Feldene Capsules Oxycodone with Aspirin   Bufferin with Codeine Fenoprofen Fenoprofen Pabalate or Pabalate-SF   Buffets II Flogesic Panagesic   Buffinol plain or Extra Strength Florinal or Florinal with Codeine Panwarfarin   Buf-Tabs Flurbiprofen Penicillamine   Butalbital Compound Four-way cold tablets Penicillin   Butazolidin Fragmin Pepto-Bismol   Carbenicillin Geminisyn Percodan   Carna Arthritis Reliever Geopen Persantine   Carprofen Gold's salt Persistin   Chloramphenicol Goody's Phenylbutazone   Chloromycetin Haltrain Piroxlcam   Clmetidine heparin Plaquenil   Cllnoril Hyco-pap Ponstel   Clofibrate Hydroxy chloroquine Propoxyphen         Before stopping any of these medications, be sure to consult the physician who ordered them.  Some, such as Coumadin (Warfarin) are ordered to prevent or treat serious conditions such as "deep thrombosis", "pumonary embolisms", and other heart problems.  The amount of time that  you may need off of the medication may also vary with the medication and the reason for which you were taking it.  If you are taking any of these medications, please make sure you notify your pain physician before you undergo any procedures.

## 2015-09-20 ENCOUNTER — Ambulatory Visit: Payer: Medicare Other | Admitting: Pain Medicine

## 2015-09-21 ENCOUNTER — Encounter: Payer: Self-pay | Admitting: Pain Medicine

## 2015-09-21 ENCOUNTER — Ambulatory Visit: Payer: Medicare Other | Attending: Pain Medicine | Admitting: Pain Medicine

## 2015-09-21 VITALS — BP 128/91 | HR 71 | Temp 98.2°F | Resp 20 | Ht 64.0 in | Wt 190.0 lb

## 2015-09-21 DIAGNOSIS — G588 Other specified mononeuropathies: Secondary | ICD-10-CM

## 2015-09-21 DIAGNOSIS — M5116 Intervertebral disc disorders with radiculopathy, lumbar region: Secondary | ICD-10-CM | POA: Insufficient documentation

## 2015-09-21 DIAGNOSIS — M545 Low back pain: Secondary | ICD-10-CM | POA: Diagnosis present

## 2015-09-21 DIAGNOSIS — M51369 Other intervertebral disc degeneration, lumbar region without mention of lumbar back pain or lower extremity pain: Secondary | ICD-10-CM

## 2015-09-21 DIAGNOSIS — G43909 Migraine, unspecified, not intractable, without status migrainosus: Secondary | ICD-10-CM | POA: Diagnosis not present

## 2015-09-21 DIAGNOSIS — M5481 Occipital neuralgia: Secondary | ICD-10-CM

## 2015-09-21 DIAGNOSIS — R51 Headache: Secondary | ICD-10-CM | POA: Diagnosis present

## 2015-09-21 DIAGNOSIS — M533 Sacrococcygeal disorders, not elsewhere classified: Secondary | ICD-10-CM | POA: Diagnosis not present

## 2015-09-21 DIAGNOSIS — M5136 Other intervertebral disc degeneration, lumbar region: Secondary | ICD-10-CM

## 2015-09-21 DIAGNOSIS — M47816 Spondylosis without myelopathy or radiculopathy, lumbar region: Secondary | ICD-10-CM

## 2015-09-21 DIAGNOSIS — M5126 Other intervertebral disc displacement, lumbar region: Secondary | ICD-10-CM | POA: Insufficient documentation

## 2015-09-21 DIAGNOSIS — M2578 Osteophyte, vertebrae: Secondary | ICD-10-CM | POA: Diagnosis not present

## 2015-09-21 DIAGNOSIS — M4806 Spinal stenosis, lumbar region: Secondary | ICD-10-CM | POA: Diagnosis not present

## 2015-09-21 DIAGNOSIS — M47894 Other spondylosis, thoracic region: Secondary | ICD-10-CM

## 2015-09-21 DIAGNOSIS — M47814 Spondylosis without myelopathy or radiculopathy, thoracic region: Secondary | ICD-10-CM

## 2015-09-21 DIAGNOSIS — M48062 Spinal stenosis, lumbar region with neurogenic claudication: Secondary | ICD-10-CM

## 2015-09-21 DIAGNOSIS — G43109 Migraine with aura, not intractable, without status migrainosus: Secondary | ICD-10-CM

## 2015-09-21 DIAGNOSIS — M5134 Other intervertebral disc degeneration, thoracic region: Secondary | ICD-10-CM

## 2015-09-21 DIAGNOSIS — M5416 Radiculopathy, lumbar region: Secondary | ICD-10-CM

## 2015-09-21 MED ORDER — METHADONE HCL 10 MG PO TABS: ORAL_TABLET | ORAL | Status: DC

## 2015-09-21 NOTE — Progress Notes (Signed)
Subjective:    Patient ID: Cheyenne Gray, female    DOB: 1966-02-25, 50 y.o.   MRN: [id number]  HPI The patient is a 50 year old female who returns to pain management for further evaluation and treatment of pain involving headaches as well as pain involving the neck upper mid back lower back and lower extremity region. The patient recently moved from one residence to another and states that her headaches have decreased significantly. The patient also was with improvement of her headaches following interventional treatment including stellate ganglion block and greater occipital nerve blocks. At the present time patient has return of pain involving the lower back lower extremity region. We have discussed patient's condition and will consider patient for interventional treatment consisting of greater function rhizolysis lumbar facet, medial branch nerve blocks on the right side at L5, L4, and L3. The patient is without recent trauma to cause any significant change in condition. We will continue Neurontin and methadone and we will remain available to consider modification of treatment regimen pending follow-up evaluation. All agreed to suggested treatment plan   Review of Systems     Objective:   Physical Exam   Was tenderness of the splenius capitis and occipitalis region a moderate degree with no new masses of the hip negative noted. There was tenderness over the region of the cervical facet and thoracic facet thoracic paraspinal musculature region cervical paraspinal musculature region a moderate degree. Palpation of the acromioclavicular and glenohumeral joint regions reproduce mild to moderate discomfort. Patient appeared to be with slightly decreased grip strength with Tinel and Phalen's maneuver reproducing mild discomfort. Palpation over the thoracic region thoracic facet region was with no crepitus of the thoracic region noted. Palpation over the lumbar region lumbar paraspinal musculature  region was attends to palpation of moderately severe degree with lateral bending rotation extension and palpation over the lumbar facets reproducing moderately severe discomfort. Straight leg raise was tolerates approximately 30 without an increase of pain with dorsiflexion noted. There was negative clonus negative Homans. DTRs were difficult to elicit patient had difficulty relaxing. Abdomen nontender with no costovertebral angle tenderness noted.. The predominant portion of patient's pain was reproduced with palpation over the lumbar facet lumbar paraspinal musculature region. Palpation over the PSIS and PI is regions reproduce mild to moderate discomfort.     Assessment & Plan:     Migraine headache  Bilateral occipital neuralgia  Degenerative disc disease lumbar spine L3 L4 multifactorial moderate to marked spinal stenosis.  L4-L5 bulge with superimposed central disc protrusion slightly greater to the left. Indentation ventral aspect of the thecal sac slightly greater to the left. Facet degenerative changes. L5-S1 mild bulge with osteophyte with foraminal/lateral extension slightly greater on the left. Mild encroachment upon but not compression of the exiting L5 nerve roots. Facet joint degenerative changes.  Lumbar stenosis with neurogenic claudication  Lumbar radiculopathy  Lumbar facet syndrome  Sacroiliac joint dysfunction  Right upper extremity, secondary to fall (sprain versus fracture) Resolved     PLAN  Continue present medication Neurontin and methadone. Continue nortriptyline as per neurologist as discussed   NO TYLENOL Tylenol can cause severe damage to liver and kidney  Radiofrequency rhizolysis lumbar facet, medial branch nerves on the right side at L5, L4, and L3 to be performed at time return appointment pending insurance approval  F/U PCP Dr.Aycock  for evaliation of  BP fatigue and general medical  condition. Also discuss Tylenol consumption with Dr. Letta PateAycock .  STOP TYLENOL CONSUMPTION PLEASE  F/U surgical evaluation .Marland Kitchen. Please ask receptionist and nurses the date of your neurosurgical evaluation  F/U neurological evaluation for further assessment of headaches as well as for general neurological evaluation as discussed   May consider radiofrequency rhizolysis or intraspinal procedures pending response to present treatment and F/U evaluation  Patient to call Pain Management Center should patient have concerns prior to scheduled return appointment

## 2015-09-21 NOTE — Patient Instructions (Addendum)
PLAN  Continue present medication Neurontin and methadone. Continue nortriptyline as per neurologist as discussed   NO TYLENOL Tylenol can cause severe damage to liver and kidney  Radiofrequency rhizolysis lumbar facet, medial branch nerves on the right side at L5, L4, and L3 to be performed at time return appointment pending insurance approval  F/U PCP Dr.Aycock  for evaliation of  BP fatigue and general medical  condition. Also discuss Tylenol consumption with Dr. Aycock . STOP Letta PateYLENOL CONSUMPTION PLEASE  F/U surgical evaluation .Marland Kitchen. Please ask receptionist and nurses the date of your neurosurgical evaluation  F/U neurological evaluation for further assessment of headaches as well as for general neurological evaluation as discussed   May consider radiofrequency rhizolysis or intraspinal procedures pending response to present treatment and F/U evaluation.  Patient to call [facility] should patient have concerns prior to scheduled return appointmentFacet Blocks Patient Information  Description: The facets are joints in the spine between the vertebrae.  Like any joints in the body, facets can become irritated and painful.  Arthritis can also effect the facets.  By injecting steroids and local anesthetic in and around these joints, we can temporarily block the nerve supply to them.  Steroids act directly on irritated nerves and tissues to reduce selling and inflammation which often leads to decreased pain.  Facet blocks may be done anywhere along the spine from the neck to the low back depending upon the location of your pain.   After numbing the skin with local anesthetic (like Novocaine), a small needle is passed onto the facet joints under x-ray guidance.  You may experience a sensation of pressure while this is being done.  The entire block usually lasts about 15-25 minutes.   Conditions which may be treated by facet blocks:   Low back/buttock pain  Neck/shoulder pain  Certain  types of headaches  Preparation for the injection:  1. Do not eat any solid food or dairy products within 8 hours of your appointment. 2. You may drink clear liquid up to 3 hours before appointment.  Clear liquids include water, black coffee, juice or soda.  No milk or cream please. 3. You may take your regular medication, including pain medications, with a sip of water before your appointment.  Diabetics should hold regular insulin (if taken separately) and take 1/2 normal NPH dose the morning of the procedure.  Carry some sugar containing items with you to your appointment. 4. A driver must accompany you and be prepared to drive you home after your procedure. 5. Bring all your current medications with you. 6. An IV may be inserted and sedation may be given at the discretion of the physician. 7. A blood pressure cuff, EKG and other monitors will often be applied during the procedure.  Some patients may need to have extra oxygen administered for a short period. 8. You will be asked to provide medical information, including your allergies and medications, prior to the procedure.  We must know immediately if you are taking blood thinners (like Coumadin/Warfarin) or if you are allergic to IV iodine contrast (dye).  We must know if you could possible be pregnant.  Possible side-effects:   Bleeding from needle site  Infection (rare, may require surgery)  Nerve injury (rare)  Numbness & tingling (temporary)  Difficulty urinating (rare, temporary)  Spinal headache (a headache worse with upright posture)  Light-headedness (temporary)  Pain at injection site (serveral days)  Decreased blood pressure (rare, temporary)  Weakness in arm/leg (temporary)  Pressure sensation in back/neck (temporary)   Call if you experience:   Fever/chills associated with headache or increased back/neck pain  Headache worsened by an upright position  New onset, weakness or numbness of an extremity  below the injection site  Hives or difficulty breathing (go to the emergency room)  Inflammation or drainage at the injection site(s)  Severe back/neck pain greater than usual  New symptoms which are concerning to you  Please note:  Although the local anesthetic injected can often make your back or neck feel good for several hours after the injection, the pain will likely return. It takes 3-7 days for steroids to work.  You may not notice any pain relief for at least one week.  If effective, we will often do a series of 2-3 injections spaced 3-6 weeks apart to maximally decrease your pain.  After the initial series, you may be a candidate for a more permanent nerve block of the facets.  If you have any questions, please call #[phone] [facility] ClinicRadio[facility] Lesioning Radiofrequency lesioning is a procedure that is performed to relieve pain. The procedure is often used for back, neck, or arm pain. Radiofrequency lesioning involves the use of a machine that creates radio waves to make heat. During the procedure, the heat is applied to the nerve that carries the pain signal. The heat damages the nerve and interferes with the pain signal. Pain relief usually lasts for 6 months to 1 year. LET [facility] CARE PROVIDER KNOW ABOUT: 4. Any allergies you have. 5. All medicines you are taking, including vitamins, herbs, eye drops, creams, and over-the-counter medicines. 6. Previous problems you or members of your family have had with the use of anesthetics. 7. Any blood disorders you have. 8. Previous surgeries you have had. 9. Any medical conditions you have. 10. Whether you are pregnant or may be pregnant. RISKS AND COMPLICATIONS Generally, this is a safe procedure. However, problems may occur, including:  Pain or soreness at the injection site.  Infection at the injection site.  Damage to nerves or blood vessels. BEFORE THE PROCEDURE 9. Ask your  health care provider about: 1. Changing or stopping your regular medicines. This is especially important if you are taking diabetes medicines or blood thinners. 2. Taking medicines such as aspirin and ibuprofen. These medicines can thin your blood. Do not take these medicines before your procedure if your health care provider instructs you not to. 10. Follow instructions from your health care provider about eating or drinking restrictions. 11. Plan to have someone take you home after the procedure. 12. If you go home right after the procedure, plan to have someone with you for 24 hours. PROCEDURE  You will be given one or more of the following:  A medicine to help you relax (sedative).  A medicine to numb the area (local anesthetic).  You will be awake during the procedure. You will need to be able to talk with the health care provider during the procedure.  With the help of a type of X-ray (fluoroscopy), the health care provider will insert a radiofrequency needle into the area to be treated.  Next, a wire that carries the radio waves (electrode) will be put through the radiofrequency needle. An electrical pulse will be sent through the electrode to verify the correct nerve. You will feel a tingling sensation, and you may have muscle twitching.  Then, the tissue that is around the needle tip will be heated by an  electric current that is passed using the radiofrequency machine. This will numb the nerves.  A bandage (dressing) will be put on the insertion area after the procedure is done. The procedure may vary among health care providers and hospitals. AFTER THE PROCEDURE 12. Your blood pressure, heart rate, breathing rate, and blood oxygen level will be monitored often until the medicines you were given have worn off. 13. Return to your normal activities as directed by your health care provider.   This information is not intended to replace advice given to you by your health care  provider. Make sure you discuss any questions you have with your health care provider.   Document Released: 10/30/2010 Document Revised: 11/22/2014 Document Reviewed: 04/10/2014 Elsevier Interactive Patient Education 2016 [employer] GENERAL RISKS AND COMPLICATIONS  What are the risk, side effects and possible complications? Generally speaking, most procedures are safe.  However, with any procedure there are risks, side effects, and the possibility of complications.  The risks and complications are dependent upon the sites that are lesioned, or the type of nerve block to be performed.  The closer the procedure is to the spine, the more serious the risks are.  Great care is taken when placing the radio frequency needles, block needles or lesioning probes, but sometimes complications can occur. 1. Infection: Any time there is an injection through the skin, there is a risk of infection.  This is why sterile conditions are used for these blocks.  There are four possible types of infection. 1. Localized skin infection. 2. Central Nervous System Infection-This can be in the form of Meningitis, which can be deadly. 3. Epidural Infections-This can be in the form of an epidural abscess, which can cause pressure inside of the spine, causing compression of the spinal cord with subsequent paralysis. This would require an emergency surgery to decompress, and there are no guarantees that the patient would recover from the paralysis. 4. Discitis-This is an infection of the intervertebral discs.  It occurs in about 1% of discography procedures.  It is difficult to treat and it may lead to surgery.        2. Pain: the needles have to go through skin and soft tissues, will cause soreness.       3. Damage to internal structures:  The nerves to be lesioned may be near blood vessels or    other nerves which can be potentially damaged.       4. Bleeding: Bleeding is more common if the patient is taking blood thinners  such as  aspirin, Coumadin, Ticiid, Plavix, etc., or if he/she have some genetic predisposition  such as hemophilia. Bleeding into the spinal canal can cause compression of the spinal  cord with subsequent paralysis.  This would require an emergency surgery to  decompress and there are no guarantees that the patient would recover from the  paralysis.       5. Pneumothorax:  Puncturing of a lung is a possibility, every time a needle is introduced in  the area of the chest or upper back.  Pneumothorax refers to free air around the  collapsed lung(s), inside of the thoracic cavity (chest cavity).  Another two possible  complications related to a similar event would include: Hemothorax and Chylothorax.   These are variations of the Pneumothorax, where instead of air around the collapsed  lung(s), you may have blood or chyle, respectively.       6. Spinal headaches: They may occur with any procedures in  the area of the spine.       7. Persistent CSF (Cerebro-Spinal Fluid) leakage: This is a rare problem, but may occur  with prolonged intrathecal or epidural catheters either due to the formation of a fistulous  track or a dural tear.       8. Nerve damage: By working so close to the spinal cord, there is always a possibility of  nerve damage, which could be as serious as a permanent spinal cord injury with  paralysis.       9. Death:  Although rare, severe deadly allergic reactions known as "Anaphylactic  reaction" can occur to any of the medications used.      10. Worsening of the symptoms:  We can always make thing worse.  What are the chances of something like this happening? Chances of any of this occuring are extremely low.  By statistics, you have more of a chance of getting killed in a motor vehicle accident: while driving to the hospital than any of the above occurring .  Nevertheless, you should be aware that they are possibilities.  In general, it is similar to taking a shower.  Everybody knows that you  can slip, hit your head and get killed.  Does that mean that you should not shower again?  Nevertheless always keep in mind that statistics do not mean anything if you happen to be on the wrong side of them.  Even if a procedure has a 1 (one) in a 1,000,000 (million) chance of going wrong, it you happen to be that one..Also, keep in mind that by statistics, you have more of a chance of having something go wrong when taking medications.  Who should not have this procedure? If you are on a blood thinning medication (e.g. Coumadin, Plavix, see list of "Blood Thinners"), or if you have an active infection going on, you should not have the procedure.  If you are taking any blood thinners, please inform your physician.  How should I prepare for this procedure?  Do not eat or drink anything at least six hours prior to the procedure.  Bring a driver with you .  It cannot be a taxi.  Come accompanied by an adult that can drive you back, and that is strong enough to help you if your legs get weak or numb from the local anesthetic.  Take all of your medicines the morning of the procedure with just enough water to swallow them.  If you have diabetes, make sure that you are scheduled to have your procedure done first thing in the morning, whenever possible.  If you have diabetes, take only half of your insulin dose and notify our nurse that you have done so as soon as you arrive at the clinic.  If you are diabetic, but only take blood sugar pills (oral hypoglycemic), then do not take them on the morning of your procedure.  You may take them after you have had the procedure.  Do not take aspirin or any aspirin-containing medications, at least eleven (11) days prior to the procedure.  They may prolong bleeding.  Wear loose fitting clothing that may be easy to take off and that you would not mind if it got stained with Betadine or blood.  Do not wear any jewelry or perfume  Remove any nail coloring.  It  will interfere with some of our monitoring equipment.  NOTE: Remember that this is not meant to be interpreted as a complete list of  all possible complications.  Unforeseen problems may occur.  BLOOD THINNERS The following drugs contain aspirin or other products, which can cause increased bleeding during surgery and should not be taken for 2 weeks prior to and 1 week after surgery.  If you should need take something for relief of minor pain, you may take acetaminophen which is found in Tylenol,m Datril, Anacin-3 and Panadol. It is not blood thinner. The products listed below are.  Do not take any of the products listed below in addition to any listed on your instruction sheet.  A.P.C or A.P.C with Codeine Codeine Phosphate Capsules #3 Ibuprofen Ridaura  ABC compound Congesprin Imuran rimadil  Advil Cope Indocin Robaxisal  Alka-Seltzer Effervescent Pain Reliever and Antacid Coricidin or Coricidin-D  Indomethacin Rufen  Alka-Seltzer plus Cold Medicine Cosprin Ketoprofen S-A-C Tablets  Anacin Analgesic Tablets or Capsules Coumadin Korlgesic Salflex  Anacin Extra Strength Analgesic tablets or capsules CP-2 Tablets Lanoril Salicylate  Anaprox Cuprimine Capsules Levenox Salocol  Anexsia-D Dalteparin Magan Salsalate  Anodynos Darvon compound Magnesium Salicylate Sine-off  Ansaid Dasin Capsules Magsal Sodium Salicylate  Anturane Depen Capsules Marnal Soma  APF Arthritis pain formula Dewitt's Pills Measurin Stanback  Argesic Dia-Gesic Meclofenamic Sulfinpyrazone  Arthritis Bayer Timed Release Aspirin Diclofenac Meclomen Sulindac  Arthritis pain formula Anacin Dicumarol Medipren Supac  Analgesic (Safety coated) Arthralgen Diffunasal Mefanamic Suprofen  Arthritis Strength Bufferin Dihydrocodeine Mepro Compound Suprol  Arthropan liquid Dopirydamole Methcarbomol with Aspirin Synalgos  ASA tablets/Enseals Disalcid Micrainin Tagament  Ascriptin Doan's Midol Talwin  Ascriptin A/D Dolene Mobidin Tanderil   Ascriptin Extra Strength Dolobid Moblgesic Ticlid  Ascriptin with Codeine Doloprin or Doloprin with Codeine Momentum Tolectin  Asperbuf Duoprin Mono-gesic Trendar  Aspergum Duradyne Motrin or Motrin IB Triminicin  Aspirin plain, buffered or enteric coated Durasal Myochrisine Trigesic  Aspirin Suppositories Easprin Nalfon Trillsate  Aspirin with Codeine Ecotrin Regular or Extra Strength Naprosyn Uracel  Atromid-S Efficin Naproxen Ursinus  Auranofin Capsules Elmiron Neocylate Vanquish  Axotal Emagrin Norgesic Verin  Azathioprine Empirin or Empirin with Codeine Normiflo Vitamin E  Azolid Emprazil Nuprin Voltaren  Bayer Aspirin plain, buffered or children's or timed BC Tablets or powders Encaprin Orgaran Warfarin Sodium  Buff-a-Comp Enoxaparin Orudis Zorpin  Buff-a-Comp with Codeine Equegesic Os-Cal-Gesic   Buffaprin Excedrin plain, buffered or Extra Strength Oxalid   Bufferin Arthritis Strength Feldene Oxphenbutazone   Bufferin plain or Extra Strength Feldene Capsules Oxycodone with Aspirin   Bufferin with Codeine Fenoprofen Fenoprofen Pabalate or Pabalate-SF   Buffets II Flogesic Panagesic   Buffinol plain or Extra Strength Florinal or Florinal with Codeine Panwarfarin   Buf-Tabs Flurbiprofen Penicillamine   Butalbital Compound Four-way cold tablets Penicillin   Butazolidin Fragmin Pepto-Bismol   Carbenicillin Geminisyn Percodan   Carna Arthritis Reliever Geopen Persantine   Carprofen Gold's salt Persistin   Chloramphenicol Goody's Phenylbutazone   Chloromycetin Haltrain Piroxlcam   Clmetidine heparin Plaquenil   Cllnoril Hyco-pap Ponstel   Clofibrate Hydroxy chloroquine Propoxyphen         Before stopping any of these medications, be sure to consult the physician who ordered them.  Some, such as Coumadin (Warfarin) are ordered to prevent or treat serious conditions such as "deep thrombosis", "pumonary embolisms", and other heart problems.  The amount of time that you may need off  of the medication may also vary with the medication and the reason for which you were taking it.  If you are taking any of these medications, please make sure you notify your pain physician before you undergo any procedures.

## 2015-09-21 NOTE — Progress Notes (Signed)
Safety precautions to be maintained throughout the outpatient stay will include: orient to surroundings, keep bed in low position, maintain call bell within reach at all times, provide assistance with transfer out of bed and ambulation.  

## 2015-10-08 ENCOUNTER — Ambulatory Visit: Payer: Medicare Other | Admitting: Pain Medicine

## 2015-10-11 ENCOUNTER — Encounter: Payer: Medicare Other | Admitting: Pain Medicine

## 2015-10-18 ENCOUNTER — Encounter (INDEPENDENT_AMBULATORY_CARE_PROVIDER_SITE_OTHER): Payer: Self-pay

## 2015-10-18 ENCOUNTER — Ambulatory Visit: Payer: Medicare Other | Attending: Pain Medicine | Admitting: Pain Medicine

## 2015-10-18 ENCOUNTER — Encounter: Payer: Self-pay | Admitting: Pain Medicine

## 2015-10-18 VITALS — BP 133/85 | HR 70 | Temp 97.0°F | Resp 18 | Ht 64.0 in | Wt 190.0 lb

## 2015-10-18 DIAGNOSIS — M5116 Intervertebral disc disorders with radiculopathy, lumbar region: Secondary | ICD-10-CM | POA: Insufficient documentation

## 2015-10-18 DIAGNOSIS — M5126 Other intervertebral disc displacement, lumbar region: Secondary | ICD-10-CM | POA: Diagnosis not present

## 2015-10-18 DIAGNOSIS — G43909 Migraine, unspecified, not intractable, without status migrainosus: Secondary | ICD-10-CM | POA: Insufficient documentation

## 2015-10-18 DIAGNOSIS — M5416 Radiculopathy, lumbar region: Secondary | ICD-10-CM

## 2015-10-18 DIAGNOSIS — M4806 Spinal stenosis, lumbar region: Secondary | ICD-10-CM | POA: Insufficient documentation

## 2015-10-18 DIAGNOSIS — M542 Cervicalgia: Secondary | ICD-10-CM | POA: Diagnosis present

## 2015-10-18 DIAGNOSIS — M5481 Occipital neuralgia: Secondary | ICD-10-CM | POA: Diagnosis not present

## 2015-10-18 DIAGNOSIS — R51 Headache: Secondary | ICD-10-CM | POA: Diagnosis present

## 2015-10-18 DIAGNOSIS — R0789 Other chest pain: Secondary | ICD-10-CM

## 2015-10-18 DIAGNOSIS — M2578 Osteophyte, vertebrae: Secondary | ICD-10-CM | POA: Insufficient documentation

## 2015-10-18 DIAGNOSIS — M48062 Spinal stenosis, lumbar region with neurogenic claudication: Secondary | ICD-10-CM | POA: Insufficient documentation

## 2015-10-18 DIAGNOSIS — M5136 Other intervertebral disc degeneration, lumbar region: Secondary | ICD-10-CM

## 2015-10-18 DIAGNOSIS — M5134 Other intervertebral disc degeneration, thoracic region: Secondary | ICD-10-CM

## 2015-10-18 DIAGNOSIS — M47816 Spondylosis without myelopathy or radiculopathy, lumbar region: Secondary | ICD-10-CM

## 2015-10-18 MED ORDER — METHADONE HCL 10 MG PO TABS: ORAL_TABLET | ORAL | 0 refills | Status: DC

## 2015-10-18 NOTE — Patient Instructions (Addendum)
PLAN  Continue present medication Neurontin and methadone. Continue nortriptyline as per neurologist as discussed    NO TYLENOL Tylenol can cause severe damage to liver and kidney  Lumbar epidural steroid injection to be performed at time return appointment  Radiofrequency rhizolysis lumbar facet, medial branch nerves on the right side at L5, L4, and L3 to be performed pending insurance approval Ask the nurses and secretaries if insurance has approved you for radiofrequency  F/U PCP Dr.Aycock  for evaliation of  BP fatigue and general medical  condition. Also discuss Tylenol consumption with Dr. Letta Pate . STOP TYLENOL CONSUMPTION PLEASE  F/U surgical evaluation .Marland Kitchen Please ask receptionist and nurses the date of your neurosurgical evaluation  F/U neurological evaluation for further assessment of headaches as well as for general neurological evaluation as discussed   May consider radiofrequency rhizolysis or intraspinal procedures pending response to present treatment and F/U evaluation.  Patient to call Pain Management Center should patient have concerns prior to scheduled return appointmentepEpidural Steroid Injection Patient Information  Description: The epidural space surrounds the nerves as they exit the spinal cord.  In some patients, the nerves can be compressed and inflamed by a bulging disc or a tight spinal canal (spinal stenosis).  By injecting steroids into the epidural space, we can bring irritated nerves into direct contact with a potentially helpful medication.  These steroids act directly on the irritated nerves and can reduce swelling and inflammation which often leads to decreased pain.  Epidural steroids may be injected anywhere along the spine and from the neck to the low back depending upon the location of your pain.   After numbing the skin with local anesthetic (like Novocaine), a small needle is passed into the epidural space slowly.  You may experience a sensation of  pressure while this is being done.  The entire block usually last less than 10 minutes.  Conditions which may be treated by epidural steroids:   Low back and leg pain  Neck and arm pain  Spinal stenosis  Post-laminectomy syndrome  Herpes zoster (shingles) pain  Pain from compression fractures  Preparation for the injection:  1. Do not eat any solid food or dairy products within 8 hours of your appointment.  2. You may drink clear liquids up to 3 hours before appointment.  Clear liquids include water, black coffee, juice or soda.  No milk or cream please. 3. You may take your regular medication, including pain medications, with a sip of water before your appointment  Diabetics should hold regular insulin (if taken separately) and take 1/2 normal NPH dos the morning of the procedure.  Carry some sugar containing items with you to your appointment. 4. A driver must accompany you and be prepared to drive you home after your procedure.  5. Bring all your current medications with your. 6. An IV may be inserted and sedation may be given at the discretion of the physician.   7. A blood pressure cuff, EKG and other monitors will often be applied during the procedure.  Some patients may need to have extra oxygen administered for a short period. 8. You will be asked to provide medical information, including your allergies, prior to the procedure.  We must know immediately if you are taking blood thinners (like Coumadin/Warfarin)  Or if you are allergic to IV iodine contrast (dye). We must know if you could possible be pregnant.  Possible side-effects:  Bleeding from needle site  Infection (rare, may require surgery)  Nerve injury (  rare)  Numbness & tingling (temporary)  Difficulty urinating (rare, temporary)  Spinal headache ( a headache worse with upright posture)  Light -headedness (temporary)  Pain at injection site (several days)  Decreased blood pressure  (temporary)  Weakness in arm/leg (temporary)  Pressure sensation in back/neck (temporary)  Call if you experience:  Fever/chills associated with headache or increased back/neck pain.  Headache worsened by an upright position.  New onset weakness or numbness of an extremity below the injection site  Hives or difficulty breathing (go to the emergency room)  Inflammation or drainage at the infection site  Severe back/neck pain  Any new symptoms which are concerning to you  Please note:  Although the local anesthetic injected can often make your back or neck feel good for several hours after the injection, the pain will likely return.  It takes 3-7 days for steroids to work in the epidural space.  You may not notice any pain relief for at least that one week.  If effective, we will often do a series of three injections spaced 3-6 weeks apart to maximally decrease your pain.  After the initial series, we generally will wait several months before considering a repeat injection of the same type.  If you have any questions, please call [phone] [facility]

## 2015-10-18 NOTE — Progress Notes (Signed)
    The patient is a 50 year old female who returns to pain management for further evaluation and treatment of pain involving the neck associated with headaches as well as significant lumbar and lower extremity pain. The patient had some improvement of headache following stellate ganglion block will was felt to be with component of migraine headache. At present time patient states the most bothersome pain involves the lower back and lower extremity regions which is aggravated by standing and walking and becomes more intense as patient spends time on the feet. The patient denies any trauma change in events of daily living the call significant change in symptomatology. We will continue methadone Neurontin at this time and we will proceed with scheduling patient for lumbar epidural steroid injection to be performed at time of return appointment. All agreed to suggested treatment plan     Physical examination  There was tenderness of the splenius capitate and occipitalis region of moderate degree. Palpation of the cervical facet cervical paraspinal musculature region reproduces moderate discomfort. There was tenderness of the acromioclavicular and glenohumeral joint region a moderate degree. The patient was unremarkable Spurling's maneuver. Tinel and Phalen's maneuver were associated with increased pain with palpation of the thoracic region was with tenderness to palpation without crepitus of the thoracic region noted. Palpation over the lumbar paraspinal musculatures and lumbar facet region was associated with moderate discomfort. Straight leg raising was limited to approximately 20 without definite increase of pain with dorsiflexion noted. DTRs appeared to be trace at the knees. There was no definite sensory deficit or dermatomal dystrophy detected. There was negative clonus negative Homans. Abdomen nontender without costovertebral tenderness noted     Assessment   Migraine headache  Bilateral  occipital neuralgia  Degenerative disc disease lumbar spine L3 L4 multifactorial moderate to marked spinal stenosis.  L4-L5 bulge with superimposed central disc protrusion slightly greater to the left. Indentation ventral aspect of the thecal sac slightly greater to the left. Facet degenerative changes. L5-S1 mild bulge with osteophyte with foraminal/lateral extension slightly greater on the left. Mild encroachment upon but not compression of the exiting L5 nerve roots. Facet joint degenerative changes.  Lumbar stenosis with neurogenic claudication  Lumbar radiculopathy  Lumbar facet syndrome      PLAN  Continue present medication Neurontin and methadone. Continue nortriptyline as per neurologist as discussed    NO TYLENOL Tylenol can cause severe damage to liver and kidney  Lumbar epidural steroid injection to be performed at time return appointment  Radiofrequency rhizolysis lumbar facet, medial branch nerves on the right side at L5, L4, and L3 to be performed pending insurance approval Ask the nurses and secretaries if insurance has approved you for radiofrequency  F/U PCP Dr.Aycock  for evaliation of  BP fatigue and general medical  condition. Also discuss Tylenol consumption with Dr. Letta Pate . STOP TYLENOL CONSUMPTION PLEASE  F/U surgical evaluation .Marland Kitchen Please ask receptionist and nurses the date of your neurosurgical evaluation  F/U neurological evaluation for further assessment of headaches as well as for general neurological evaluation as discussed   May consider radiofrequency rhizolysis or intraspinal procedures pending response to present treatment and F/U evaluation.  Patient to call [facility] should patient have concerns prior to scheduled return appointmentep        Sacroiliac joint dysfunction  Right upper extremity, secondary to fall (sprain versus fracture) Resolved

## 2015-10-18 NOTE — Progress Notes (Signed)
Safety precautions to be maintained throughout the outpatient stay will include: orient to surroundings, keep bed in low position, maintain call bell within reach at all times, provide assistance with transfer out of bed and ambulation.  

## 2015-10-29 ENCOUNTER — Ambulatory Visit: Payer: Medicare Other | Admitting: Pain Medicine

## 2015-10-29 ENCOUNTER — Telehealth: Payer: Self-pay | Admitting: Pain Medicine

## 2015-10-29 NOTE — Telephone Encounter (Signed)
Thank you :)

## 2015-10-29 NOTE — Telephone Encounter (Signed)
[name] left vmail on Friday stating she was on way to [place] and could not come to appt on Monday, she will resched when she gets back

## 2015-11-15 ENCOUNTER — Ambulatory Visit: Payer: Medicare Other | Attending: Pain Medicine | Admitting: Pain Medicine

## 2015-11-15 ENCOUNTER — Encounter: Payer: Self-pay | Admitting: Pain Medicine

## 2015-11-15 VITALS — BP 134/79 | HR 69 | Temp 98.6°F | Resp 16 | Ht 64.0 in | Wt 200.0 lb

## 2015-11-15 DIAGNOSIS — G43909 Migraine, unspecified, not intractable, without status migrainosus: Secondary | ICD-10-CM | POA: Diagnosis not present

## 2015-11-15 DIAGNOSIS — M47816 Spondylosis without myelopathy or radiculopathy, lumbar region: Secondary | ICD-10-CM

## 2015-11-15 DIAGNOSIS — M5136 Other intervertebral disc degeneration, lumbar region: Secondary | ICD-10-CM

## 2015-11-15 DIAGNOSIS — M5481 Occipital neuralgia: Secondary | ICD-10-CM | POA: Diagnosis not present

## 2015-11-15 DIAGNOSIS — M47896 Other spondylosis, lumbar region: Secondary | ICD-10-CM | POA: Insufficient documentation

## 2015-11-15 DIAGNOSIS — M5116 Intervertebral disc disorders with radiculopathy, lumbar region: Secondary | ICD-10-CM | POA: Diagnosis not present

## 2015-11-15 DIAGNOSIS — G43009 Migraine without aura, not intractable, without status migrainosus: Secondary | ICD-10-CM

## 2015-11-15 DIAGNOSIS — M2578 Osteophyte, vertebrae: Secondary | ICD-10-CM | POA: Diagnosis not present

## 2015-11-15 DIAGNOSIS — M4806 Spinal stenosis, lumbar region: Secondary | ICD-10-CM | POA: Diagnosis not present

## 2015-11-15 DIAGNOSIS — M542 Cervicalgia: Secondary | ICD-10-CM | POA: Diagnosis present

## 2015-11-15 DIAGNOSIS — M5126 Other intervertebral disc displacement, lumbar region: Secondary | ICD-10-CM | POA: Diagnosis not present

## 2015-11-15 DIAGNOSIS — R51 Headache: Secondary | ICD-10-CM | POA: Diagnosis present

## 2015-11-15 DIAGNOSIS — M5134 Other intervertebral disc degeneration, thoracic region: Secondary | ICD-10-CM

## 2015-11-15 DIAGNOSIS — M48062 Spinal stenosis, lumbar region with neurogenic claudication: Secondary | ICD-10-CM

## 2015-11-15 DIAGNOSIS — M533 Sacrococcygeal disorders, not elsewhere classified: Secondary | ICD-10-CM

## 2015-11-15 MED ORDER — GABAPENTIN 400 MG PO CAPS: ORAL_CAPSULE | ORAL | 0 refills | Status: DC

## 2015-11-15 MED ORDER — METHADONE HCL 10 MG PO TABS: ORAL_TABLET | ORAL | 0 refills | Status: DC

## 2015-11-15 NOTE — Progress Notes (Signed)
Patient here for medication management Safety precautions to be maintained throughout the outpatient stay will include: orient to surroundings, keep bed in low position, maintain call bell within reach at all times, provide assistance with transfer out of bed and ambulation.  

## 2015-11-15 NOTE — Progress Notes (Signed)
The patient is a 50 year old female who returns to pain management for further evaluation and treatment of pain involving the region of the neck associated with headaches as well as pain involving the mid lower back and lower extremity regions. The patient states that the lower back lower extremity pain is aggravated by standing walking and becomes more intense as patient spends more time on the feet. The patient is without trauma change in events of daily living the cost change in symptomatology. We discussed patient's condition and we will consider patient for lumbosacral selective nerve root block and other procedures pending follow-up evaluation. We have also discussed patient undergoing radiofrequency rhizolysis lumbar facet, medial branch nerves and will consider such treatment pending insurance approval as discussed. We will continue presently prescribed medications consisting of Neurontin and methadone at this time. We will also discussed further evaluation including neurological evaluation for assessment and treatment of headaches as well as for further evaluation and assessment of lumbar lower extremity pain. We will continue present medications at this time and remain available to consider interventional treatment pending follow-up evaluations. All agreed to suggested treatment plan      Physical examination  There was tenderness to palpation of the paraspinal musculature region cervical region cervical facet region of moderate degree. There was moderate tenderness of the splenius capitis and occipitalis region as well as the cervical facet cervical paraspinal musculature region of moderate degree. There were no masses of the head and neck noted and no bounding pulsations of the temporal region noted. The patient appeared to be with slightly decreased grip strength with Tinel and Phalen's maneuver reproducing mild discomfort. Palpation over the thoracic region was with tenderness to  palpation of moderate degree of the lower thoracic region with no crepitus of the thoracic region noted. Palpation over the lumbar region lumbar paraspinal musculature region was attends to palpation of moderate degree with lateral bending rotation extension and palpation of the lumbar facets reproducing moderate discomfort. Straight leg raise was tolerates approximately 20 without increased pain with dorsiflexion noted. DTRs appeared to be trace at the knees. No definite sensory deficit or dermatomal distribution detected. There was negative clonus negative Homans. Abdomen was nontender with no costovertebral tenderness noted.     Assessment   Migraine headache  Bilateral occipital neuralgia  Degenerative disc disease lumbar spine L3 L4 multifactorial moderate to marked spinal stenosis.  L4-L5 bulge with superimposed central disc protrusion slightly greater to the left. Indentation ventral aspect of the thecal sac slightly greater to the left. Facet degenerative changes. L5-S1 mild bulge with osteophyte with foraminal/lateral extension slightly greater on the left. Mild encroachment upon but not compression of the exiting L5 nerve roots. Facet joint degenerative changes.  Lumbar stenosis with neurogenic claudication  Lumbar radiculopathy  Lumbar facet syndrome      PLAN  Continue present medication Neurontin and methadone. Continue nortriptyline as per neurologist as discussed    NO TYLENOL.   Tylenol can cause severe damage to liver and kidney  We will consider lumbosacral selective nerve root blocks as we discussed and we will also consider radiofrequency procedures pending insurance approval for radiofrequency as we have previously discussed  Radiofrequency rhizolysis lumbar facet, medial branch nerves on the right side at L5, L4, and L3 to be performed pending insurance approval Ask the nurses and secretaries if insurance has approved you for radiofrequency  F/U PCP  Dr.Aycock  for evaliation of  BP fatigue and general medical  condition. Also discuss  Tylenol consumption with Dr. Letta Pate . STOP TYLENOL CONSUMPTION PLEASE  F/U surgical evaluation .Marland Kitchen Please ask receptionist and nurses the date of your neurosurgical evaluation  F/U neurological evaluation for further assessment of headaches as well as for general neurological evaluation as discussed   May consider radiofrequency rhizolysis or intraspinal procedures pending response to present treatment and F/U evaluation.  Patient to call [facility] Management Center should patient have concerns prior to scheduled return appointment

## 2015-11-15 NOTE — Patient Instructions (Addendum)
PLAN  Continue present medication Neurontin and methadone. Continue nortriptyline as per neurologist as discussed    NO TYLENOL.   Tylenol can cause severe damage to liver and kidney  We will consider lumbosacral selective nerve root blocks as we discussed and we will also consider radiofrequency procedures pending insurance approval for radiofrequency as we have previously discussed  Radiofrequency rhizolysis lumbar facet, medial branch nerves on the right side at L5, L4, and L3 to be performed pending insurance approval Ask the nurses and secretaries if insurance has approved you for radiofrequency  F/U PCP Dr.Aycock  for evaliation of  BP fatigue and general medical  condition. Also discuss Tylenol consumption with Dr. Letta PateAycock . STOP TYLENOL CONSUMPTION PLEASE  F/U surgical evaluation .Marland Kitchen. Please ask receptionist and nurses the date of your neurosurgical evaluation  F/U neurological evaluation for further assessment of headaches as well as for general neurological evaluation as discussed   May consider radiofrequency rhizolysis or intraspinal procedures pending response to present treatment and F/U evaluation.  Patient to call [facility] Management Center should patient have concerns prior to scheduled return appointment

## 2015-11-20 ENCOUNTER — Inpatient Hospital Stay: Payer: Medicare Other | Attending: Oncology

## 2015-11-20 ENCOUNTER — Inpatient Hospital Stay: Payer: Medicare Other

## 2015-12-07 ENCOUNTER — Encounter: Payer: Self-pay | Admitting: Cardiovascular Disease

## 2015-12-07 ENCOUNTER — Ambulatory Visit (INDEPENDENT_AMBULATORY_CARE_PROVIDER_SITE_OTHER): Payer: Medicare Other | Admitting: Cardiovascular Disease

## 2015-12-07 DIAGNOSIS — R0789 Other chest pain: Secondary | ICD-10-CM | POA: Diagnosis not present

## 2015-12-07 DIAGNOSIS — E785 Hyperlipidemia, unspecified: Secondary | ICD-10-CM

## 2015-12-07 DIAGNOSIS — I209 Angina pectoris, unspecified: Secondary | ICD-10-CM

## 2015-12-07 DIAGNOSIS — I2510 Atherosclerotic heart disease of native coronary artery without angina pectoris: Secondary | ICD-10-CM

## 2015-12-07 MED ORDER — ROSUVASTATIN CALCIUM 10 MG PO TABS: 10.0000 mg | ORAL_TABLET | Freq: Every day | ORAL | 11 refills | Status: DC

## 2015-12-07 MED ORDER — NITROGLYCERIN 0.4 MG SL SUBL: 0.4000 mg | SUBLINGUAL_TABLET | SUBLINGUAL | 3 refills | Status: DC | PRN

## 2015-12-07 NOTE — Patient Instructions (Addendum)
Medication Instructions:   NTG SL as needed Start crestor 1/2 pill for a few weeks Then up to a full pill  Labwork:  No new labs needed  Testing/Procedures:  We will schedule a lexiscan myoview for chest pain, angina  Follow-Up: It was a pleasure seeing you in the office today. Please call us if you have new issues that need to be addressed before your next appt.  [phone]  Your physician wants you to follow-up in: 6 months.  You will receive a reminder letter in the mail two months in advance. If you don't receive a letter, please call our office to schedule the follow-up appointment.  If you need a refill on your cardiac medications before your next appointment, please call your pharmacy. [facility] MYOVIEW  Your caregiver has ordered a Stress Test with nuclear imaging. The purpose of this test is to evaluate the blood supply to your heart muscle. This procedure is referred to as a "Non-Invasive Stress Test." This is because other than having an IV started in your vein, nothing is inserted or "invades" your body. Cardiac stress tests are done to find areas of poor blood flow to the heart by determining the extent of coronary artery disease (CAD). Some patients exercise on a treadmill, which naturally increases the blood flow to your heart, while others who are  unable to walk on a treadmill due to physical limitations have a pharmacologic/chemical stress agent called Lexiscan . This medicine will mimic walking on a treadmill by temporarily increasing your coronary blood flow.   Please note: these test may take anywhere between 2-4 hours to complete  [facility]  THE VOLUNTEERS AT THE FIRST DESK WILL DIRECT YOU WHERE TO GO  Date of Procedure:___Tuesday, October 10______  Arrival Time for Procedure:____7:45 am_________  How to prepare for your Myoview test:  1. Do not eat or drink after midnight 2. No caffeine for 24 hours prior to test 3. No  smoking 24 hours prior to test. 4. Your medication may be taken with water.  If your doctor stopped a medication because of this test, do not take that medication. 5. Ladies, please do not wear dresses.  Skirts or pants are appropriate. Please wear a short sleeve shirt. 6. No perfume, cologne or lotion.   Cardiac Nuclear Scanning A cardiac nuclear scan is used to check your heart for problems, such as the following:  A portion of the heart is not getting enough blood.  Part of the heart muscle has died, which happens with a heart attack.  The heart wall is not working normally.  In this test, a radioactive dye (tracer) is injected into your bloodstream. After the tracer has traveled to your heart, a scanning device is used to measure how much of the tracer is absorbed by or distributed to various areas of your heart. LET [facility] PROVIDER KNOW ABOUT:  Any allergies you have.  All medicines you are taking, including vitamins, herbs, eye drops, creams, and over-the-counter medicines.  Previous problems you or members of your family have had with the use of anesthetics.  Any blood disorders you have.  Previous surgeries you have had.  Medical conditions you have.  RISKS AND COMPLICATIONS Generally, this is a safe procedure. However, as with any procedure, problems can occur. Possible problems include:   Serious chest pain.  Rapid heartbeat.  Sensation of warmth in your chest. This usually passes quickly. BEFORE THE PROCEDURE Ask your health care  provider about changing or stopping your regular medicines. PROCEDURE This procedure is usually done at a hospital and takes 2-4 hours.  An IV tube is inserted into one of your veins.  Your health care provider will inject a small amount of radioactive tracer through the tube.  You will then wait for 20-40 minutes while the tracer travels through your bloodstream.  You will lie down on an exam table so images of your  heart can be taken. Images will be taken for about 15-20 minutes.  You will exercise on a treadmill or stationary bike. While you exercise, your heart activity will be monitored with an electrocardiogram (ECG), and your blood pressure will be checked.  If you are unable to exercise, you may be given a medicine to make your heart beat faster.  When blood flow to your heart has peaked, tracer will again be injected through the IV tube.  After 20-40 minutes, you will get back on the exam table and have more images taken of your heart.  When the procedure is over, your IV tube will be removed. AFTER THE PROCEDURE  You will likely be able to leave shortly after the test. Unless your health care provider tells you otherwise, you may return to your normal schedule, including diet, activities, and medicines.  Make sure you find out how and when you will get your test results.   This information is not intended to replace advice given to you by your health care provider. Make sure you discuss any questions you have with your health care provider.   Document Released: 03/28/2004 Document Revised: 03/08/2013 Document Reviewed: 02/09/2013 [employer] Interactive Patient Education [employer]
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
## 2015-12-23 ENCOUNTER — Emergency Department
Admission: EM | Admit: 2015-12-23 | Discharge: 2015-12-23 | Disposition: A | Discharge status: Home / Self Care | Payer: Medicare Other | Attending: Emergency Medicine | Admitting: Emergency Medicine

## 2015-12-23 DIAGNOSIS — Y939 Activity, unspecified: Secondary | ICD-10-CM | POA: Insufficient documentation

## 2015-12-23 DIAGNOSIS — Y999 Unspecified external cause status: Secondary | ICD-10-CM | POA: Insufficient documentation

## 2015-12-23 DIAGNOSIS — Y929 Unspecified place or not applicable: Secondary | ICD-10-CM | POA: Insufficient documentation

## 2015-12-23 DIAGNOSIS — S1015XA Superficial foreign body of throat, initial encounter: Secondary | ICD-10-CM | POA: Diagnosis present

## 2015-12-23 DIAGNOSIS — J45909 Unspecified asthma, uncomplicated: Secondary | ICD-10-CM | POA: Diagnosis not present

## 2015-12-23 DIAGNOSIS — I2510 Atherosclerotic heart disease of native coronary artery without angina pectoris: Secondary | ICD-10-CM | POA: Insufficient documentation

## 2015-12-23 DIAGNOSIS — K222 Esophageal obstruction: Secondary | ICD-10-CM | POA: Diagnosis not present

## 2015-12-23 DIAGNOSIS — Z79899 Other long term (current) drug therapy: Secondary | ICD-10-CM | POA: Diagnosis not present

## 2015-12-23 DIAGNOSIS — X58XXXA Exposure to other specified factors, initial encounter: Secondary | ICD-10-CM | POA: Diagnosis not present

## 2015-12-23 DIAGNOSIS — T18128A Food in esophagus causing other injury, initial encounter: Secondary | ICD-10-CM

## 2015-12-23 MED ORDER — MORPHINE SULFATE (PF) 4 MG/ML IV SOLN
INTRAVENOUS | Status: AC
  Administered 2015-12-23: 4 mg via INTRAVENOUS
  Filled 2015-12-23: qty 1

## 2015-12-23 MED ORDER — SODIUM CHLORIDE 0.9 % IV BOLUS (SEPSIS)
1000.0000 mL | Freq: Once | INTRAVENOUS | Status: AC
  Administered 2015-12-23: 1000 mL via INTRAVENOUS

## 2015-12-23 MED ORDER — ONDANSETRON HCL 4 MG/2ML IJ SOLN
4.0000 mg | Freq: Once | INTRAMUSCULAR | Status: AC
  Administered 2015-12-23: 4 mg via INTRAVENOUS

## 2015-12-23 MED ORDER — GLUCAGON HCL RDNA (DIAGNOSTIC) 1 MG IJ SOLR
0.5000 mg | Freq: Once | INTRAMUSCULAR | Status: AC
  Administered 2015-12-23: 0.5 mg via INTRAVENOUS

## 2015-12-23 MED ORDER — GLUCAGON HCL RDNA (DIAGNOSTIC) 1 MG IJ SOLR
INTRAMUSCULAR | Status: AC
  Administered 2015-12-23: 0.5 mg via INTRAVENOUS
  Filled 2015-12-23: qty 1

## 2015-12-23 MED ORDER — MORPHINE SULFATE (PF) 4 MG/ML IV SOLN
4.0000 mg | Freq: Once | INTRAVENOUS | Status: AC
  Administered 2015-12-23: 4 mg via INTRAVENOUS

## 2015-12-23 MED ORDER — ONDANSETRON HCL 4 MG/2ML IJ SOLN
INTRAMUSCULAR | Status: AC
Start: 2015-12-23 — End: 2015-12-23
  Administered 2015-12-23: 4 mg via INTRAVENOUS
  Filled 2015-12-23: qty 2

## 2015-12-23 NOTE — Discharge Instructions (Signed)
You were evaluated after esophageal blockage with food, which was relieved here in the emergency department.  As we discussed, you may want to discuss referral to gastroenterologist to determine whether or not to test further for esophageal narrowing.  Return to the emergency department for any worsening symptoms, including vomiting blood, worsening pain, or unable to tolerate secretions or food/liquids.  For the time being, soft foods, and/or make sure that you are very culturing your food before swallowing.

## 2015-12-23 NOTE — ED Provider Notes (Signed)
[facility] Emergency Department Provider Note ____________________________________________   I have reviewed the triage vital signs and the triage nursing note.  HISTORY  Chief Complaint Foreign Body (PBJ sandwich)   Historian Patient  HPI Cheyenne Gray is a 50 y.o. female with a history of gastric pouch, presents today after taking a bite of peanut butter and jelly sandwich and feeling like it was stuck in the back of her throat. She drinks some fluids and states that it went down a small amount but then she was gagging and throwing up the liquid.  She's never had a food bolus problem before, but has had gastric surgery.  Symptoms are moderate at this time.    Past Medical History:  Diagnosis Date  . Arthritis   . Asthma   . B12 deficiency   . Chronic back pain   . Chronic headaches   . Chronic pain    a. on methadone  . Depression   . DJD (degenerative joint disease)   . DJD (degenerative joint disease), multiple sites   . Hand, foot and mouth disease 2016  . History of echocardiogram    a. echo 2014: EF 65-70%, nl WM, mildly dilated LA, PASP nl  . History of shingles   . Iron deficiency   . Iron deficiency anemia   . Long QT interval   . Methadone use (HCC)    managed by Dr. Metta Clinesrisp  . Migraine   . Obesity   . Palpitations    a. 24 hour Holter: NSR, sinus brady down to 48, occasional PVCs & couplets, 8 beats NSVT; b. 30 day event monitor 2015: NSR with rare PVC  . Psoriasis   . Syncope and collapse   . Vitamin D deficiency     Patient Active Problem List   Diagnosis Date Noted  . Spinal stenosis, lumbar region, with neurogenic claudication 10/18/2015  . Lumbar radiculopathy 10/18/2015  . Migraine 09/21/2015  . Chronic tension-type headache, intractable 08/08/2015  . Medication overuse headache 08/08/2015  . Bilateral occipital neuralgia 06/04/2015  . Migraine headache 06/04/2015  . Angina pectoris (HCC) 02/07/2015  . IDA (iron  deficiency anemia) 09/19/2014  . 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 DDD (degenerative disc disease), lumbar 09/14/2014  . Sacroiliac joint dysfunction 09/14/2014  . Facet syndrome, lumbar 09/14/2014  . Hyperlipidemia 01/25/2014  . PVC (premature ventricular contraction) 01/10/2014  . Fatigue 05/25/2013  . CAD (coronary artery disease) 04/22/2013  . PVD (peripheral vascular disease) (HCC) 04/22/2013  . Long Q-T syndrome 07/09/2012  . Chest pressure 07/09/2012  . NSVT (nonsustained ventricular tachycardia) (HCC) 07/09/2012  . Syncope 07/09/2012  . MUSCLE STRAIN 07/20/2009    Past Surgical History:  Procedure Laterality Date  . BARIATRIC SURGERY  2001  . CHOLECYSTECTOMY  2001  . GALLBLADDER SURGERY    . GASTRIC BYPASS    . GASTROPLASTY      Prior to Admission medications   Medication Sig Start Date End Date Taking? Authorizing Provider  albuterol (PROVENTIL HFA;VENTOLIN HFA) 108 (90 BASE) MCG/ACT inhaler Inhale 2 puffs into the lungs as needed for wheezing.    Historical Provider, MD  clobetasol cream (TEMOVATE) 0.05 % APPLY TO AFFECTED AREA TWICE DAILY FOR UP TO 14 DAYS 11/15/15   Historical Provider, MD  cloNIDine (CATAPRES) 0.1 MG tablet 1 BY MOUTH ONCE A DAY FOR HOT FLASHES AND BLOOD PRESSURE 11/24/15   Historical Provider, MD  cyclobenzaprine (FLEXERIL) 10 MG tablet Take 10 mg by mouth at bedtime  as needed for muscle spasms. Reported on 07/25/2015    Historical Provider, MD  gabapentin (NEURONTIN) 400 MG capsule Limit 2 tablets in the a.m. and midday and 3 tablets each evening    Please dispense a three-month supply 11/15/15   Ewing Schlein, MD  methadone (DOLOPHINE) 10 MG tablet Limit 1-2 tablets by mouth 2-3 times per day if tolerated 11/15/15   Ewing Schlein, MD  nitroGLYCERIN (NITROSTAT) 0.4 MG SL tablet Place 1 tablet (0.4 mg total) under the tongue every 5 (five) minutes as needed for chest pain. 12/07/15 12/06/16  Antonieta Iba, MD  rosuvastatin (CRESTOR)  10 MG tablet Take 1 tablet (10 mg total) by mouth daily. 12/07/15 12/06/16  Antonieta Iba, MD    Allergies  Allergen Reactions  . Penicillins     REACTION: Rash, Hives, S.O.B.    Family History  Problem Relation Age of Onset  . Heart attack Mother   . Cancer Mother   . Heart attack Father 90    MI  . Heart attack Brother   . Heart disease Brother   . Heart attack Maternal Grandmother   . Cancer Maternal Grandmother   . Cancer Maternal Uncle     Social History Social History  Substance Use Topics  . Smoking status: Never Smoker  . Smokeless tobacco: Never Used  . Alcohol use No    Review of Systems  Constitutional: Negative for recent digestive problems. Eyes: Negative for visual changes. ENT: Sore throat Cardiovascular: Negative for chest pain. Respiratory: Gagging and trouble breathing when gagging. Gastrointestinal: Positive for heaving and vomiting back secretions. Genitourinary: Negative for dysuria. Musculoskeletal: Negative for back pain. Skin: Negative for rash. Neurological: Negative for headache. 10 point Review of Systems otherwise negative ____________________________________________   PHYSICAL EXAM:  VITAL SIGNS: ED Triage Vitals  Enc Vitals Group     BP --      Pulse --      Resp --      Temp --      Temp src --      SpO2 --      Weight 12/23/15 2056 240 lb (108.9 kg)     Height 12/23/15 2056 5\' 4"  (1.626 m)     Head Circumference --      Peak Flow --      Pain Score 12/23/15 2057 8     Pain Loc --      Pain Edu? --      Excl. in GC? --      Constitutional: Alert and oriented. Looks like she feels uncomfortable. HEENT   Head: Normocephalic and atraumatic.      Eyes: Conjunctivae are normal. PERRL. Normal extraocular movements.      Ears:         Nose: No congestion/rhinnorhea.   Mouth/Throat: Mucous membranes are moist.   Neck: No stridor. Cardiovascular/Chest: Normal rate, regular rhythm.  No murmurs, rubs, or  gallops. Respiratory: Normal respiratory effort without tachypnea nor retractions. Breath sounds are clear and equal bilaterally. No wheezes/rales/rhonchi. Gastrointestinal: Soft. No distention, no guarding, no rebound. Nontender.    Genitourinary/rectal:Deferred Musculoskeletal: Nontender with normal range of motion in all extremities. No joint effusions.  No lower extremity tenderness.  No edema. Neurologic:  Normal speech and language. No gross or focal neurologic deficits are appreciated. Skin:  Skin is warm, dry and intact. No rash noted. Psychiatric: Mood and affect are normal. Speech and behavior are normal. Patient exhibits appropriate insight and judgment.   ____________________________________________  LABS (pertinent positives/negatives)  Labs Reviewed - No data to display  ____________________________________________    EKG I, Governor Rooks, MD, the attending physician have personally viewed and interpreted all ECGs.  None ____________________________________________  RADIOLOGY All Xrays were viewed by me. Imaging interpreted by Radiologist.  none __________________________________________  PROCEDURES  Procedure(s) performed: None  Critical Care performed: None  ____________________________________________   ED COURSE / ASSESSMENT AND PLAN  Pertinent labs & imaging results that were available during my care of the patient were reviewed by me and considered in my medical decision making (see chart for details).   Ms. Landowski is here with a food bolus, thankfully it sounds like its a peanut butter and jelly bolus. I suspect this to likely resolve, but in the meantime I am going to give her symptomatic medications including pain and nausea medicine and glucagon.  1020, patient was able to take down Coke without any reflux. At this point no evidence of complete obstruction. She has some soreness and belches the smell of peanut butter, but she feels comfortable  going home now. She has a regular doctor's appointment scheduled for this week. We discussed the possibility of being referred to gastroenterology to consider esophageal narrowing.     CONSULTATIONS:   None   Patient / Family / Caregiver informed of clinical course, medical decision-making process, and agree with plan.   I discussed return precautions, follow-up instructions, and discharge instructions with patient and/or family.   ___________________________________________   FINAL CLINICAL IMPRESSION(S) / ED DIAGNOSES   Final diagnoses:  Esophageal obstruction due to food impaction              Note: This dictation was prepared with Dragon dictation. Any transcriptional errors that result from this process are unintentional    Governor Rooks, MD 12/23/15 2224

## 2015-12-23 NOTE — ED Triage Notes (Signed)
Pt reports eating a PBJ sandwich before arrival to ED that became stuck when swallowing, unable to swallow fluids at this time, O2 sats at 100% in rm. Pt states hx of gastric bypass surgery. Pt is talking and answering questions during triage.

## 2015-12-24 ENCOUNTER — Telehealth: Payer: Self-pay | Admitting: Cardiovascular Disease

## 2015-12-24 DIAGNOSIS — Z01812 Encounter for preprocedural laboratory examination: Secondary | ICD-10-CM

## 2015-12-24 NOTE — Telephone Encounter (Signed)
Spoke w/ Abbie @ [facility]. Pt can either come over today for urine pregnancy test, or arrive @ 7am tomorrow.   Left message for pt to call back.

## 2015-12-24 NOTE — Telephone Encounter (Signed)
Nurse calling states pt needs pregnancy test before her stress test tomorrow.

## 2015-12-24 NOTE — Telephone Encounter (Signed)
Spoke w/ pt.  [facility] test.

## 2015-12-25 ENCOUNTER — Ambulatory Visit: Admission: RE | Admit: 2015-12-25 | Payer: Medicare Other | Source: Ambulatory Visit

## 2015-12-25 NOTE — Telephone Encounter (Signed)
Pt No-Showed for [facility] stress test today.

## 2016-02-17 ENCOUNTER — Other Ambulatory Visit: Payer: Self-pay | Admitting: Pain Medicine

## 2016-02-19 ENCOUNTER — Inpatient Hospital Stay: Payer: Medicare Other

## 2016-02-27 NOTE — Progress Notes (Deleted)
[facility]  Telephone:(336) [phone] Fax:(336) [phone]     ID: Cheyenne Gray Gray: 1965-06-09  MR#: [id number]  ZHY#:[id number]#:[id number]  Patient Care Team: Emogene MorganNgwe A Aycock, MD as PCP - General (Family Medicine) Emogene MorganNgwe A Aycock, MD as Referring Physician (Family Medicine) Cheyenne Ibaimothy J Gollan, MD as Consulting Physician (Cardiology)  CHIEF COMPLAINT/DIAGNOSIS:  Iron-deficiency anemia. Intolerant to oral iron supplementation. Also has a history of gastric bypass in 2001.  On parenteral iron therapy for recurrent iron deficiency.   HISTORY OF PRESENT ILLNESS:  Patient returns for continued hematology followup. She was last seen in July 2016 by Dr. Sherrlyn HockPandit. She typically receives maintenance Venofer infusions every 3 months but has not had an infusion since October or November 2016. Currently states that she has fatigue on exertion, otherwise remains physically activity. Otherwise, no new dyspnea, palpitation, angina, dizziness, orthopnea, or PND. Denies any new bleeding symptoms including bright red blood in stools or hematuria. States that sometimes menstrual periods are heavier but otherwise mostly regular. No new bone pains. Appetite is good, no unintentional weight loss. No new paresthesias in extremities.   REVIEW OF SYSTEMS:   ROS As in HPI above. In addition, no fevers or sweats. No new headaches or focal weakness.  No new sore throat, cough, sputum, hemoptysis or chest pain. No dizziness or palpitation. No abdominal pain, constipation, diarrhea, dysuria or hematuria. No new skin rash or bleeding symptoms. No new paresthesias in extremities.    PAST MEDICAL HISTORY: Reviewed. Past Medical History:  Diagnosis Date  . Arthritis   . Asthma   . B12 deficiency   . Chronic back pain   . Chronic headaches   . Chronic pain    a. on methadone  . Depression   . DJD (degenerative joint disease)   . DJD (degenerative joint disease), multiple sites   . Hand, foot and mouth disease 2016  .  History of echocardiogram    a. echo 2014: EF 65-70%, nl WM, mildly dilated LA, PASP nl  . History of shingles   . Iron deficiency   . Iron deficiency anemia   . Long QT interval   . Methadone use (HCC)    managed by Dr. Metta Clinesrisp  . Migraine   . Obesity   . Palpitations    a. 24 hour Holter: NSR, sinus brady down to 48, occasional PVCs & couplets, 8 beats NSVT; b. 30 day event monitor 2015: NSR with rare PVC  . Psoriasis   . Syncope and collapse   . Vitamin D deficiency     PAST SURGICAL HISTORY: Reviewed. Past Surgical History:  Procedure Laterality Date  . BARIATRIC SURGERY  2001  . CHOLECYSTECTOMY  2001  . GALLBLADDER SURGERY    . GASTRIC BYPASS    . GASTROPLASTY      FAMILY HISTORY: Reviewed. Family History  Problem Relation Age of Onset  . Heart attack Mother   . Cancer Mother   . Heart attack Father 2833    MI  . Heart attack Brother   . Heart disease Brother   . Heart attack Maternal Grandmother   . Cancer Maternal Grandmother   . Cancer Maternal Uncle     SOCIAL HISTORY: Reviewed. Social History  Substance Use Topics  . Smoking status: Never Smoker  . Smokeless tobacco: Never Used  . Alcohol use No    Allergies  Allergen Reactions  . Penicillins     REACTION: Rash, Hives, S.O.B.    Current Outpatient Prescriptions  Medication Sig  Dispense Refill  . albuterol (PROVENTIL HFA;VENTOLIN HFA) 108 (90 BASE) MCG/ACT inhaler Inhale 2 puffs into the lungs as needed for wheezing.    . clobetasol cream (TEMOVATE) 0.05 % APPLY TO AFFECTED AREA TWICE DAILY FOR UP TO 14 DAYS  1  . cloNIDine (CATAPRES) 0.1 MG tablet 1 BY MOUTH ONCE A DAY FOR HOT FLASHES AND BLOOD PRESSURE  1  . cyclobenzaprine (FLEXERIL) 10 MG tablet Take 10 mg by mouth at bedtime as needed for muscle spasms. Reported on 07/25/2015    . gabapentin (NEURONTIN) 400 MG capsule Limit 2 tablets in the a.m. and midday and 3 tablets each evening    Please dispense a three-month supply 630 capsule 0  .  methadone (DOLOPHINE) 10 MG tablet Limit 1-2 tablets by mouth 2-3 times per day if tolerated 180 tablet 0  . nitroGLYCERIN (NITROSTAT) 0.4 MG SL tablet Place 1 tablet (0.4 mg total) under the tongue every 5 (five) minutes as needed for chest pain. 25 tablet 3  . rosuvastatin (CRESTOR) 10 MG tablet Take 1 tablet (10 mg total) by mouth daily. 30 tablet 11   Current Facility-Administered Medications  Medication Dose Route Frequency Provider Last Rate Last Dose  . bupivacaine (PF) (MARCAINE) 0.25 % injection 30 mL  30 mL Other Once Ewing Schlein, MD      . bupivacaine (PF) (MARCAINE) 0.25 % injection 30 mL  30 mL Other Once Ewing Schlein, MD      . fentaNYL (SUBLIMAZE) injection 100 mcg  100 mcg Intravenous Once Ewing Schlein, MD      . fentaNYL (SUBLIMAZE) injection 100 mcg  100 mcg Intravenous Once Ewing Schlein, MD      . lactated ringers infusion 1,000 mL  1,000 mL Intravenous Continuous Ewing Schlein, MD      . lactated ringers infusion 1,000 mL  1,000 mL Intravenous Continuous Ewing Schlein, MD      . lactated ringers infusion 1,000 mL  1,000 mL Intravenous Continuous Ewing Schlein, MD   1,000 mL at 08/06/15 1002  . lidocaine (PF) (XYLOCAINE) 1 % injection 10 mL  10 mL Subcutaneous Once Ewing Schlein, MD      . midazolam (VERSED) 5 MG/5ML injection 5 mg  5 mg Intravenous Once Ewing Schlein, MD      . orphenadrine (NORFLEX) injection 60 mg  60 mg Intramuscular Once Ewing Schlein, MD      . orphenadrine (NORFLEX) injection 60 mg  60 mg Intramuscular Once Ewing Schlein, MD      . triamcinolone acetonide (KENALOG-40) injection 40 mg  40 mg Other Once Ewing Schlein, MD      . triamcinolone acetonide (KENALOG-40) injection 40 mg  40 mg Other Once Ewing Schlein, MD        PHYSICAL EXAM: There were no vitals filed for this visit.   There is no height or weight on file to calculate BMI.       GENERAL: Patient is alert and oriented and in no acute distress. There is no icterus or pallor. HEENT: EOMs  intact. No cervical lymphadenopathy. CVS: S1S2, regular LUNGS: Bilaterally clear to auscultation, no rhonchi. ABDOMEN: Soft, nontender.   EXTREMITIES: No pedal edema.   LAB RESULTS:  Serum Fe 76, TIBC 275, iron sat 28%, ferritin 45.    Component Value Date/Time   NA 138 01/08/2015 1159   K 3.8 01/08/2015 1159   CL 104 01/08/2015 1159   CO2 25 01/08/2015 1159   GLUCOSE 92 01/08/2015 1159   BUN 11 01/08/2015  1159   CREATININE 0.78 01/08/2015 1159   CALCIUM 9.5 01/08/2015 1159   PROT 6.8 01/30/2015 0909   PROT 6.4 05/04/2013 1556   ALBUMIN 3.5 01/30/2015 0909   ALBUMIN 3.1 (L) 05/04/2013 1556   AST 72 (H) 01/30/2015 0909   AST 38 (H) 05/04/2013 1556   ALT 37 01/30/2015 0909   ALT 39 05/04/2013 1556   ALKPHOS 97 01/30/2015 0909   ALKPHOS 84 05/04/2013 1556   BILITOT 0.3 01/30/2015 0909   BILITOT 0.3 05/04/2013 1556   GFRNONAA >60 01/08/2015 1159   GFRAA >60 01/08/2015 1159   Lab Results  Component Value Date   WBC 7.3 01/08/2015   NEUTROABS 3.4 09/19/2014   HGB 13.2 08/21/2015   HCT 43.0 01/08/2015   MCV 92.3 01/08/2015   PLT 217 01/08/2015     ASSESSMENT / PLAN:   1. Recurrent iron-deficiency anemia. Intolerant to oral iron supplementation. Also has a history of gastric bypass in 2001.  On parenteral iron therapy intermittently since 2010 -  Reviewed labs from today and d/w patient. Patient clinically doing steady except for fatigue on physical exertion. She has been on maintenance dose of IV Venofer 100 mg but has not received an infusion since October or November 2016. Unsure as to why. Labs shows Hb remains normal. Plan is to continue on maintenance dose of Venofer 100 mg IV once every 3 months. Will monitor CBC and iron study in 3 months. Have explained that she will need large doses of IV Venofer if she develops recurrent iron deficiency. We will schedule her next M.D. follow-up, lab and infusion in approximately 6 months.  In between visits, she was advised to  call or come to ER in case of any progressive anemia symptoms or acute sickness. She is agreeable to this plan.  Dr. Orlie DakinFinnegan was available for consultation and review of plan of care for this patient.  Jeralyn Ruthsimothy J Anquanette Bahner, MD   02/27/2016 11:28 PM

## 2016-02-28 ENCOUNTER — Inpatient Hospital Stay: Payer: Medicare Other

## 2016-02-28 ENCOUNTER — Inpatient Hospital Stay: Payer: Medicare Other | Admitting: Oncology

## 2016-03-16 ENCOUNTER — Emergency Department
Admission: EM | Admit: 2016-03-16 | Discharge: 2016-03-17 | Disposition: A | Discharge status: Home / Self Care | Payer: Medicare Other | Attending: Emergency Medicine | Admitting: Emergency Medicine

## 2016-03-16 ENCOUNTER — Encounter: Payer: Self-pay | Admitting: Emergency Medicine

## 2016-03-16 DIAGNOSIS — R079 Chest pain, unspecified: Secondary | ICD-10-CM | POA: Insufficient documentation

## 2016-03-16 DIAGNOSIS — Z79899 Other long term (current) drug therapy: Secondary | ICD-10-CM | POA: Insufficient documentation

## 2016-03-16 DIAGNOSIS — R0602 Shortness of breath: Secondary | ICD-10-CM | POA: Insufficient documentation

## 2016-03-16 DIAGNOSIS — J45909 Unspecified asthma, uncomplicated: Secondary | ICD-10-CM | POA: Diagnosis not present

## 2016-03-16 LAB — CBC
HCT: 39 % (ref 35.0–47.0)
Hemoglobin: 13.6 g/dL (ref 12.0–16.0)
MCH: 30.3 pg (ref 26.0–34.0)
MCHC: 34.8 g/dL (ref 32.0–36.0)
MCV: 86.9 fL (ref 80.0–100.0)
PLATELETS: 244 10*3/uL (ref 150–440)
RBC: 4.48 MIL/uL (ref 3.80–5.20)
RDW: 13.3 % (ref 11.5–14.5)
WBC: 9.2 10*3/uL (ref 3.6–11.0)

## 2016-03-16 MED ORDER — MORPHINE SULFATE (PF) 2 MG/ML IV SOLN
INTRAVENOUS | Status: AC
  Filled 2016-03-16: qty 1

## 2016-03-16 MED ORDER — ASPIRIN 81 MG PO CHEW
324.0000 mg | CHEWABLE_TABLET | Freq: Once | ORAL | Status: AC
  Administered 2016-03-16: 324 mg via ORAL

## 2016-03-16 MED ORDER — ASPIRIN 81 MG PO CHEW
CHEWABLE_TABLET | ORAL | Status: AC
  Filled 2016-03-16: qty 4

## 2016-03-16 MED ORDER — MORPHINE SULFATE (PF) 4 MG/ML IV SOLN: 4.0000 mg | Freq: Once | INTRAVENOUS | Status: DC

## 2016-03-16 MED ORDER — MORPHINE SULFATE (PF) 2 MG/ML IV SOLN
2.0000 mg | Freq: Once | INTRAVENOUS | Status: AC
  Administered 2016-03-16: 2 mg via INTRAVENOUS

## 2016-03-16 NOTE — ED Triage Notes (Signed)
Chest pain started at 7pm pt took 3 nitro and radiating to left arm. Pt was SOB and was clinching chest in room while MD was accessing.

## 2016-03-16 NOTE — ED Triage Notes (Addendum)
Reports chest pain left chest radiating to left arm. Patient with history of long qt.  Reports pain started at approximately 6 pm and took 3 ntg but did not help.

## 2016-03-16 NOTE — ED Provider Notes (Signed)
[facility] Emergency Department Provider Note   First MD Initiated Contact with Patient 03/16/16 2336     (approximate)  I have reviewed the triage vital signs and the nursing notes.   HISTORY  Chief Complaint Chest Pain    HPI Lincoln Viann FishM Mizer is a 50 y.o. female below list of chronic medical conditions presents to the emergency department with intermittent chest pain which started at 6:30 PM this evening that is described as squeezing. Patient states that the longest the pain as last is an hour and a half which is a present occurrence. Patient also admits to some dyspnea. Patient denies any diaphoresis or dizziness. Patient states that she's been under considerable stress today and recently had a death in the family approximately one week ago.   Past Medical History:  Diagnosis Date  . Arthritis   . Asthma   . B12 deficiency   . Chronic back pain   . Chronic headaches   . Chronic pain    a. on methadone  . Depression   . DJD (degenerative joint disease)   . DJD (degenerative joint disease), multiple sites   . Hand, foot and mouth disease 2016  . History of echocardiogram    a. echo 2014: EF 65-70%, nl WM, mildly dilated LA, PASP nl  . History of shingles   . Iron deficiency   . Iron deficiency anemia   . Long QT interval   . Methadone use (HCC)    managed by Dr. Metta Clinesrisp  . Migraine   . Obesity   . Palpitations    a. 24 hour Holter: NSR, sinus brady down to 48, occasional PVCs & couplets, 8 beats NSVT; b. 30 day event monitor 2015: NSR with rare PVC  . Psoriasis   . Syncope and collapse   . Vitamin D deficiency     Patient Active Problem List   Diagnosis Date Noted  . Spinal stenosis, lumbar region, with neurogenic claudication 10/18/2015  . Lumbar radiculopathy 10/18/2015  . Migraine 09/21/2015  . Chronic tension-type headache, intractable 08/08/2015  . Medication overuse headache 08/08/2015  . Bilateral occipital neuralgia 06/04/2015   . Migraine headache 06/04/2015  . Angina pectoris (HCC) 02/07/2015  . IDA (iron deficiency anemia) 09/19/2014  . 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 DDD (degenerative disc disease), lumbar 09/14/2014  . Sacroiliac joint dysfunction 09/14/2014  . Facet syndrome, lumbar 09/14/2014  . Hyperlipidemia 01/25/2014  . PVC (premature ventricular contraction) 01/10/2014  . Fatigue 05/25/2013  . CAD (coronary artery disease) 04/22/2013  . PVD (peripheral vascular disease) (HCC) 04/22/2013  . Long Q-T syndrome 07/09/2012  . Chest pressure 07/09/2012  . NSVT (nonsustained ventricular tachycardia) (HCC) 07/09/2012  . Syncope 07/09/2012  . MUSCLE STRAIN 07/20/2009    Past Surgical History:  Procedure Laterality Date  . BARIATRIC SURGERY  2001  . CHOLECYSTECTOMY  2001  . GALLBLADDER SURGERY    . GASTRIC BYPASS    . GASTROPLASTY      Prior to Admission medications   Medication Sig Start Date End Date Taking? Authorizing Provider  albuterol (PROVENTIL HFA;VENTOLIN HFA) 108 (90 BASE) MCG/ACT inhaler Inhale 2 puffs into the lungs as needed for wheezing.    Historical Provider, MD  clobetasol cream (TEMOVATE) 0.05 % APPLY TO AFFECTED AREA TWICE DAILY FOR UP TO 14 DAYS 11/15/15   Historical Provider, MD  cloNIDine (CATAPRES) 0.1 MG tablet 1 BY MOUTH ONCE A DAY FOR HOT FLASHES AND BLOOD PRESSURE 11/24/15   Historical  Provider, MD  cyclobenzaprine (FLEXERIL) 10 MG tablet Take 10 mg by mouth at bedtime as needed for muscle spasms. Reported on 07/25/2015    Historical Provider, MD  gabapentin (NEURONTIN) 400 MG capsule Limit 2 tablets in the a.m. and midday and 3 tablets each evening    Please dispense a three-month supply 11/15/15   Ewing Schlein, MD  methadone (DOLOPHINE) 10 MG tablet Limit 1-2 tablets by mouth 2-3 times per day if tolerated 11/15/15   Ewing Schlein, MD  nitroGLYCERIN (NITROSTAT) 0.4 MG SL tablet Place 1 tablet (0.4 mg total) under the tongue every 5 (five) minutes as  needed for chest pain. 12/07/15 12/06/16  Antonieta Iba, MD  rosuvastatin (CRESTOR) 10 MG tablet Take 1 tablet (10 mg total) by mouth daily. 12/07/15 12/06/16  Antonieta Iba, MD    Allergies Penicillins  Family History  Problem Relation Age of Onset  . Heart attack Mother   . Cancer Mother   . Heart attack Father 16    MI  . Heart attack Brother   . Heart disease Brother   . Heart attack Maternal Grandmother   . Cancer Maternal Grandmother   . Cancer Maternal Uncle     Social History Social History  Substance Use Topics  . Smoking status: Never Smoker  . Smokeless tobacco: Never Used  . Alcohol use No    Review of Systems Constitutional: No fever/chills Eyes: No visual changes. ENT: No sore throat. Cardiovascular: Positive for chest pain. Respiratory: Positive for shortness of breath. Gastrointestinal: No abdominal pain.  No nausea, no vomiting.  No diarrhea.  No constipation. Genitourinary: Negative for dysuria. Musculoskeletal: Negative for back pain. Skin: Negative for rash. Neurological: Negative for headaches, focal weakness or numbness.  10-point ROS otherwise negative.  ____________________________________________   PHYSICAL EXAM:  VITAL SIGNS: ED Triage Vitals  Enc Vitals Group     BP --      Pulse --      Resp --      Temp --      Temp src --      SpO2 --      Weight 03/16/16 2331 200 lb (90.7 kg)     Height 03/16/16 2331 5\' 5"  (1.651 m)     Head Circumference --      Peak Flow --      Pain Score 03/16/16 2332 9     Pain Loc --      Pain Edu? --      Excl. in GC? --     Constitutional: Alert and oriented. Apparent discomfort  Eyes: Conjunctivae are normal. PERRL. EOMI. Head: Atraumatic. Mouth/Throat: Mucous membranes are moist.  Oropharynx non-erythematous. Neck: No stridor.   Cardiovascular: Normal rate, regular rhythm. Good peripheral circulation. Grossly normal heart sounds. Respiratory: Normal respiratory effort.  No retractions.  Lungs CTAB. Gastrointestinal: Soft and nontender. No distention.  Musculoskeletal: No lower extremity tenderness nor edema. No gross deformities of extremities. Neurologic:  Normal speech and language. No gross focal neurologic deficits are appreciated.  Skin:  Skin is warm, dry and intact. No rash noted. Psychiatric: Mood and affect are normal. Speech and behavior are normal.  ____________________________________________   LABS (all labs ordered are listed, but only abnormal results are displayed)  Labs Reviewed  BASIC METABOLIC PANEL - Abnormal; Notable for the following:       Result Value   Potassium 3.3 (*)    All other components within normal limits  FIBRIN DERIVATIVES D-DIMER ([facility] ONLY) -  Abnormal; Notable for the following:    Fibrin derivatives D-dimer ([facility]) 683 (*)    All other components within normal limits  CBC  TROPONIN I  TROPONIN I   ____________________________________________  EKG ED ECG REPORT I, Gaylord N Pricilla Moehle, the attending physician, personally viewed and interpreted this ECG.   Date: 03/16/2016  EKG Time: 11:35 PM  Rate: 85  Rhythm: Normal sinus rhythm  Axis: Normal  Intervals: QTc 449  ST&T Change: Normal  ____________________________________________  RADIOLOGY I, [place] N Rasheen Bells, personally viewed and evaluated these images (plain radiographs) as part of my medical decision making, as well as reviewing the written report by the radiologist.  Ct Angio Chest Pe W And/or Wo Contrast  Result Date: 03/17/2016 CLINICAL DATA:  Chest pain on the left side radiating to the left arm EXAM: CT ANGIOGRAPHY CHEST WITH CONTRAST TECHNIQUE: Multidetector CT imaging of the chest was performed using the standard protocol during bolus administration of intravenous contrast. Multiplanar CT image reconstructions and MIPs were obtained to evaluate the vascular anatomy. CONTRAST:  75 mL Isovue 370 intravenous COMPARISON:  Chest x-ray 03/17/2016, CT chest 03/01/2015  FINDINGS: Cardiovascular: No filling defects within the central or segmental pulmonary arteries to suggest the presence of an acute embolus. Thoracic aorta is non aneurysmal. Coronary artery calcifications are present. There is no dissection. Heart size is nonenlarged. No significant pericardial effusion. There is an anomalous vessel adjacent to the left of the aortic arch, this drains to the left brachiocephalic vein. Mediastinum/Nodes: No significantly enlarged mediastinal or hilar nodes are present. No axillary adenopathy. Trachea and mainstem bronchi are within normal limits. Mild distal esophageal thickening. Lungs/Pleura: No acute pulmonary infiltrate, consolidation, or pleural effusion is seen. There is no pneumothorax. Upper Abdomen: Post cholecystectomy changes. Enlarged extrahepatic common bowel duct as before presumably postsurgical. Partially visualized gastric bypass surgical changes. Musculoskeletal: No suspicious bone lesions. Review of the MIP images confirms the above findings. IMPRESSION: 1. No CT evidence for acute pulmonary embolus or aortic dissection. 2. 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 Electronically Signed   By: Jasmine PangKim  Fujinaga M.D.   On: 03/17/2016 03:02   Dg Chest Port 1 View  Result Date: 03/17/2016 CLINICAL DATA:  Left-sided chest pain radiating into the left arm, onset at 18:30. No relief from sublingual nitroglycerin. EXAM: PORTABLE CHEST 1 VIEW COMPARISON:  01/20/2015 FINDINGS: A single AP portable view of the chest demonstrates no focal airspace consolidation or alveolar edema. The lungs are grossly clear. There is no large effusion or pneumothorax. Cardiac and mediastinal contours appear unremarkable. IMPRESSION: No active disease. Electronically Signed   By: Ellery Plunkaniel R  Mitchell M.D.   On: 03/17/2016 00:42     Procedures     INITIAL IMPRESSION / ASSESSMENT AND PLAN / ED COURSE  Pertinent labs & imaging results that were available during my care of the patient were reviewed by me and considered in my medical decision making (see chart for details).  History physical exam concern for possible cardiac etiology such EKG performed which revealed no evidence of ischemia or infarction. Troponin obtained 2 which were negative d-dimer obtained which was positive and a such CT scan of chest was performed which revealed no evidence of pulmonary emboli. His Janee Mornhompson denies any pain at this time. Strongly recommended outpatient follow-up with Dr. Mariah MillingGollan patient's cardiologist.  Clinical Course     ____________________________________________  FINAL CLINICAL IMPRESSION(S) / ED DIAGNOSES  Final diagnoses:  Chest pain, unspecified type     MEDICATIONS GIVEN DURING THIS VISIT:  Medications  aspirin chewable tablet 324 mg (324 mg Oral Given 03/16/16 2352)  morphine 2 MG/ML injection 2 mg (2 mg Intravenous Given 03/16/16 2353)  iopamidol (ISOVUE-370) 76 % injection 75 mL (75 mLs Intravenous Contrast Given 03/17/16 0221)     NEW OUTPATIENT MEDICATIONS STARTED DURING THIS VISIT:  New Prescriptions   No medications on file    Modified Medications   No medications on file    Discontinued Medications   No medications on file     Note:  This document was prepared using Dragon voice recognition software and may include unintentional dictation errors.    Darci Current, MD 03/17/16 [phone]

## 2016-03-17 ENCOUNTER — Emergency Department: Payer: Medicare Other

## 2016-03-17 ENCOUNTER — Encounter: Payer: Self-pay | Admitting: Radiology

## 2016-03-17 DIAGNOSIS — J45909 Unspecified asthma, uncomplicated: Secondary | ICD-10-CM | POA: Diagnosis not present

## 2016-03-17 DIAGNOSIS — R0602 Shortness of breath: Secondary | ICD-10-CM | POA: Diagnosis not present

## 2016-03-17 DIAGNOSIS — Z79899 Other long term (current) drug therapy: Secondary | ICD-10-CM | POA: Diagnosis not present

## 2016-03-17 DIAGNOSIS — R079 Chest pain, unspecified: Secondary | ICD-10-CM | POA: Diagnosis not present

## 2016-03-17 LAB — TROPONIN I: Troponin I: <0.03 ng/mL (ref <0.03)

## 2016-03-17 LAB — BASIC METABOLIC PANEL
ANION GAP: 9 (ref 5–15)
BUN: 12 mg/dL (ref 6–20)
CALCIUM: 9.1 mg/dL (ref 8.9–10.3)
CHLORIDE: 102 mmol/L (ref 101–111)
CO2: 28 mmol/L (ref 22–32)
Creatinine, Ser: 0.84 mg/dL (ref 0.44–1.00)
GFR calc non Af Amer: >60 mL/min (ref >60)
GLUCOSE: 83 mg/dL (ref 65–99)
Potassium: 3.3 mmol/L — ABNORMAL LOW (ref 3.5–5.1)
Sodium: 139 mmol/L (ref 135–145)

## 2016-03-17 LAB — FIBRIN DERIVATIVES D-DIMER (ARMC ONLY): FIBRIN DERIVATIVES D-DIMER (ARMC): 683 — AB (ref 0–499)

## 2016-03-17 MED ORDER — IOPAMIDOL (ISOVUE-370) INJECTION 76%
75.0000 mL | Freq: Once | INTRAVENOUS | Status: AC | PRN
  Administered 2016-03-17: 75 mL via INTRAVENOUS

## 2016-03-19 ENCOUNTER — Telehealth: Payer: Self-pay | Admitting: Cardiovascular Disease

## 2016-03-19 DIAGNOSIS — R079 Chest pain, unspecified: Secondary | ICD-10-CM

## 2016-03-19 NOTE — Telephone Encounter (Signed)
Spoke with patient and scheduled her to come in for Lexiscan on 03/26/16 at 09:30AM. Reviewed all instructions with her and she had no further questions. Let her know that she would need to call in advance if she is unable to keep appointment and they may require pregnancy test prior to testing. She verbalized understanding and had no further questions at this time.

## 2016-03-19 NOTE — Telephone Encounter (Signed)
We can certainly reschedule the lexi Myoview Would call more than 24 hours in advance if she needs to cancel as medication is very expensive Protocol is for pregnancy test prior to any radiation

## 2016-03-19 NOTE — Telephone Encounter (Signed)
Pt called the office today inquiring of a needed urine pregnancy test prior to lexi myoview.  She saw Dr. Mariah MillingGollan Sept 22 for atypical chest pain. Myoview ordered and scheduled for 10/9. Pt did not show and it has not been rescheduled.  Pt seen in the ED 03/16/16 for chest pain. Troponin negative, d-dimer elevated, PE ruled out. It was strongly recommended she f/u w/cardiologist. Jan 12 OV scheduled.   Pt states she has been experiencing stress in her life and increased chest pain and would like to know if she should reschedule lexi before 1/12 OV. Advised pt to discuss plan of care at OV and I will make Dr. Mariah MillingGollan aware.  She is agreeable w/plan.

## 2016-03-19 NOTE — Addendum Note (Signed)
Addended by: Bryna ColanderALLEN, Caysen Whang S on: 03/19/2016 03:32 PM   Modules accepted: Orders

## 2016-03-19 NOTE — Telephone Encounter (Signed)
Lmov for patient to call back and schedule ED fu appointment with Dr Mariah MillingGollan Pt was seen for CP on 03/16/16 Will try again at later time

## 2016-03-19 NOTE — Telephone Encounter (Signed)
Left voicemail message to call back  

## 2016-03-26 ENCOUNTER — Other Ambulatory Visit: Payer: Self-pay | Admitting: *Deleted

## 2016-03-26 ENCOUNTER — Other Ambulatory Visit
Admission: RE | Admit: 2016-03-26 | Discharge: 2016-03-26 | Disposition: A | Discharge status: Home / Self Care | Payer: Medicare Other | Source: Ambulatory Visit | Attending: Cardiovascular Disease | Admitting: Cardiovascular Disease

## 2016-03-26 ENCOUNTER — Ambulatory Visit
Admission: RE | Admit: 2016-03-26 | Discharge: 2016-03-26 | Disposition: A | Discharge status: Home / Self Care | Payer: Medicare Other | Source: Ambulatory Visit | Attending: Cardiovascular Disease | Admitting: Cardiovascular Disease

## 2016-03-26 DIAGNOSIS — Z01818 Encounter for other preprocedural examination: Secondary | ICD-10-CM

## 2016-03-26 DIAGNOSIS — R079 Chest pain, unspecified: Secondary | ICD-10-CM | POA: Diagnosis present

## 2016-03-26 LAB — PREGNANCY, URINE: Preg Test, Ur: NEGATIVE

## 2016-03-26 NOTE — Progress Notes (Deleted)
[facility]  [phone] [phone]     ID: Cheyenne DillingJodie M Gray OB: 1965/12/04  MR#: [id number]  JYN#:[id number]#:[id number]  Patient Care Team: Cheyenne MorganNgwe A Aycock, MD as PCP - General (Family Medicine) Cheyenne MorganNgwe A Aycock, MD as Referring Physician (Family Medicine) Cheyenne Ibaimothy J Gollan, MD as Consulting Physician (Cardiology)  CHIEF COMPLAINT/DIAGNOSIS:  Iron-deficiency anemia. Intolerant to oral iron supplementation. Also has a history of gastric bypass in 2001.  On parenteral iron therapy for recurrent iron deficiency.   HISTORY OF PRESENT ILLNESS:  Patient returns for continued hematology followup. She was last seen in July 2016 by Dr. Sherrlyn Gray. She typically receives maintenance Venofer infusions every 3 months but has not had an infusion since October or November 2016. Currently states that she has fatigue on exertion, otherwise remains physically activity. Otherwise, no new dyspnea, palpitation, angina, dizziness, orthopnea, or PND. Denies any new bleeding symptoms including bright red blood in stools or hematuria. States that sometimes menstrual periods are heavier but otherwise mostly regular. No new bone pains. Appetite is good, no unintentional weight loss. No new paresthesias in extremities.   REVIEW OF SYSTEMS:   ROS As in HPI above. In addition, no fevers or sweats. No new headaches or focal weakness.  No new sore throat, cough, sputum, hemoptysis or chest pain. No dizziness or palpitation. No abdominal pain, constipation, diarrhea, dysuria or hematuria. No new skin rash or bleeding symptoms. No new paresthesias in extremities.    PAST MEDICAL HISTORY: Reviewed. Past Medical History:  Diagnosis Date  . Arthritis   . Asthma   . B12 deficiency   . Chronic back pain   . Chronic headaches   . Chronic pain    a. on methadone  . Depression   . DJD (degenerative joint disease)   . DJD (degenerative joint disease), multiple sites   . Hand, foot and mouth disease 2016  .  History of echocardiogram    a. echo 2014: EF 65-70%, nl WM, mildly dilated LA, PASP nl  . History of shingles   . Iron deficiency   . Iron deficiency anemia   . Long QT interval   . Methadone use (HCC)    managed by Dr. Metta Clinesrisp  . Migraine   . Obesity   . Palpitations    a. 24 hour Holter: NSR, sinus brady down to 48, occasional PVCs & couplets, 8 beats NSVT; b. 30 day event monitor 2015: NSR with rare PVC  . Psoriasis   . Syncope and collapse   . Vitamin D deficiency     PAST SURGICAL HISTORY: Reviewed. Past Surgical History:  Procedure Laterality Date  . BARIATRIC SURGERY  2001  . CHOLECYSTECTOMY  2001  . GALLBLADDER SURGERY    . GASTRIC BYPASS    . GASTROPLASTY      FAMILY HISTORY: Reviewed. Family History  Problem Relation Age of Onset  . Heart attack Mother   . Cancer Mother   . Heart attack Father 4033    MI  . Heart attack Brother   . Heart disease Brother   . Heart attack Maternal Grandmother   . Cancer Maternal Grandmother   . Cancer Maternal Uncle     SOCIAL HISTORY: Reviewed. Social History  Substance Use Topics  . Smoking status: Never Smoker  . Smokeless tobacco: Never Used  . Alcohol use No    Allergies  Allergen Reactions  . Penicillins     REACTION: Rash, Hives, S.O.B.    Current Outpatient Prescriptions  Medication Sig  Dispense Refill  . albuterol (PROVENTIL HFA;VENTOLIN HFA) 108 (90 BASE) MCG/ACT inhaler Inhale 2 puffs into the lungs as needed for wheezing.    . clobetasol cream (TEMOVATE) 0.05 % APPLY TO AFFECTED AREA TWICE DAILY FOR UP TO 14 DAYS  1  . cloNIDine (CATAPRES) 0.1 MG tablet 1 BY MOUTH ONCE A DAY FOR HOT FLASHES AND BLOOD PRESSURE  1  . cyclobenzaprine (FLEXERIL) 10 MG tablet Take 10 mg by mouth at bedtime as needed for muscle spasms. Reported on 07/25/2015    . gabapentin (NEURONTIN) 400 MG capsule Limit 2 tablets in the a.m. and midday and 3 tablets each evening    Please dispense a three-month supply 630 capsule 0  .  methadone (DOLOPHINE) 10 MG tablet Limit 1-2 tablets by mouth 2-3 times per day if tolerated 180 tablet 0  . nitroGLYCERIN (NITROSTAT) 0.4 MG SL tablet Place 1 tablet (0.4 mg total) under the tongue every 5 (five) minutes as needed for chest pain. 25 tablet 3  . rosuvastatin (CRESTOR) 10 MG tablet Take 1 tablet (10 mg total) by mouth daily. 30 tablet 11   Current Facility-Administered Medications  Medication Dose Route Frequency Provider Last Rate Last Dose  . bupivacaine (PF) (MARCAINE) 0.25 % injection 30 mL  30 mL Other Once Cheyenne Schlein, MD      . bupivacaine (PF) (MARCAINE) 0.25 % injection 30 mL  30 mL Other Once Cheyenne Schlein, MD      . fentaNYL (SUBLIMAZE) injection 100 mcg  100 mcg Intravenous Once Cheyenne Schlein, MD      . fentaNYL (SUBLIMAZE) injection 100 mcg  100 mcg Intravenous Once Cheyenne Schlein, MD      . lactated ringers infusion 1,000 mL  1,000 mL Intravenous Continuous Cheyenne Schlein, MD      . lactated ringers infusion 1,000 mL  1,000 mL Intravenous Continuous Cheyenne Schlein, MD      . lactated ringers infusion 1,000 mL  1,000 mL Intravenous Continuous Cheyenne Schlein, MD   1,000 mL at 08/06/15 1002  . lidocaine (PF) (XYLOCAINE) 1 % injection 10 mL  10 mL Subcutaneous Once Cheyenne Schlein, MD      . midazolam (VERSED) 5 MG/5ML injection 5 mg  5 mg Intravenous Once Cheyenne Schlein, MD      . orphenadrine (NORFLEX) injection 60 mg  60 mg Intramuscular Once Cheyenne Schlein, MD      . orphenadrine (NORFLEX) injection 60 mg  60 mg Intramuscular Once Cheyenne Schlein, MD      . triamcinolone acetonide (KENALOG-40) injection 40 mg  40 mg Other Once Cheyenne Schlein, MD      . triamcinolone acetonide (KENALOG-40) injection 40 mg  40 mg Other Once Cheyenne Schlein, MD        PHYSICAL EXAM: There were no vitals filed for this visit.   There is no height or weight on file to calculate BMI.       GENERAL: Patient is alert and oriented and in no acute distress. There is no icterus or pallor. HEENT: EOMs  intact. No cervical lymphadenopathy. CVS: S1S2, regular LUNGS: Bilaterally clear to auscultation, no rhonchi. ABDOMEN: Soft, nontender.   EXTREMITIES: No pedal edema.   LAB RESULTS:  Serum Fe 76, TIBC 275, iron sat 28%, ferritin 45.    Component Value Date/Time   NA 139 03/16/2016 2344   K 3.3 (L) 03/16/2016 2344   CL 102 03/16/2016 2344   CO2 28 03/16/2016 2344   GLUCOSE 83 03/16/2016 2344   BUN 12  03/16/2016 2344   CREATININE 0.84 03/16/2016 2344   CALCIUM 9.1 03/16/2016 2344   PROT 6.8 01/30/2015 0909   PROT 6.4 05/04/2013 1556   ALBUMIN 3.5 01/30/2015 0909   ALBUMIN 3.1 (L) 05/04/2013 1556   AST 72 (H) 01/30/2015 0909   AST 38 (H) 05/04/2013 1556   ALT 37 01/30/2015 0909   ALT 39 05/04/2013 1556   ALKPHOS 97 01/30/2015 0909   ALKPHOS 84 05/04/2013 1556   BILITOT 0.3 01/30/2015 0909   BILITOT 0.3 05/04/2013 1556   GFRNONAA >60 03/16/2016 2344   GFRAA >60 03/16/2016 2344   Lab Results  Component Value Date   WBC 9.2 03/16/2016   NEUTROABS 3.4 09/19/2014   HGB 13.6 03/16/2016   HCT 39.0 03/16/2016   MCV 86.9 03/16/2016   PLT 244 03/16/2016     ASSESSMENT / PLAN:   1. Recurrent iron-deficiency anemia. Intolerant to oral iron supplementation. Also has a history of gastric bypass in 2001.  On parenteral iron therapy intermittently since 2010 -  Reviewed labs from today and d/w patient. Patient clinically doing steady except for fatigue on physical exertion. She has been on maintenance dose of IV Venofer 100 mg but has not received an infusion since October or November 2016. Unsure as to why. Labs shows Hb remains normal. Plan is to continue on maintenance dose of Venofer 100 mg IV once every 3 months. Will monitor CBC and iron study in 3 months. Have explained that she will need large doses of IV Venofer if she develops recurrent iron deficiency. We will schedule her next M.D. follow-up, lab and infusion in approximately 6 months.  In between visits, she was advised to  call or come to ER in case of any progressive anemia symptoms or acute sickness. She is agreeable to this plan.  Dr. Orlie Dakin was available for consultation and review of plan of care for this patient.  Jeralyn Ruths, MD   03/26/2016 10:48 PM

## 2016-03-27 ENCOUNTER — Ambulatory Visit
Admission: RE | Admit: 2016-03-27 | Discharge: 2016-03-27 | Disposition: A | Discharge status: Home / Self Care | Payer: Medicare Other | Source: Ambulatory Visit | Attending: Cardiovascular Disease | Admitting: Cardiovascular Disease

## 2016-03-27 ENCOUNTER — Inpatient Hospital Stay: Payer: Medicare Other | Admitting: Oncology

## 2016-03-27 ENCOUNTER — Inpatient Hospital Stay: Payer: Medicare Other

## 2016-03-27 DIAGNOSIS — R079 Chest pain, unspecified: Secondary | ICD-10-CM | POA: Insufficient documentation

## 2016-03-27 LAB — NM MYOCAR MULTI W/SPECT W/WALL MOTION / EF
CHL CUP NUCLEAR SDS: 6
CHL CUP NUCLEAR SRS: 4
CHL CUP STRESS STAGE 2 GRADE: 0 %
CHL CUP STRESS STAGE 2 SPEED: 0 mph
CHL CUP STRESS STAGE 3 GRADE: 0 %
CHL CUP STRESS STAGE 3 HR: 70 {beats}/min
CHL CUP STRESS STAGE 5 DBP: 68 mmHg
CHL CUP STRESS STAGE 5 GRADE: 0 %
CHL CUP STRESS STAGE 5 SPEED: 0 mph
CSEPEW: 1 METS
LV dias vol: 89 mL (ref 46–106)
LV sys vol: 24 mL
Peak HR: 70 {beats}/min
Percent HR: 43 %
Percent of predicted max HR: 40 %
Rest HR: 50 {beats}/min
SSS: 1
Stage 1 Grade: 0 %
Stage 1 HR: 54 {beats}/min
Stage 1 Speed: 0 mph
Stage 2 HR: 54 {beats}/min
Stage 3 Speed: 0 mph
Stage 4 Grade: 0 %
Stage 4 HR: 75 {beats}/min
Stage 4 Speed: 0 mph
Stage 5 HR: 62 {beats}/min
Stage 5 SBP: 97 mmHg
TID: 1.04

## 2016-03-27 MED ORDER — TECHNETIUM TC 99M TETROFOSMIN IV KIT
30.6200 | PACK | Freq: Once | INTRAVENOUS | Status: AC | PRN
  Administered 2016-03-27: 30.62 via INTRAVENOUS

## 2016-03-27 MED ORDER — TECHNETIUM TC 99M TETROFOSMIN IV KIT
12.1200 | PACK | Freq: Once | INTRAVENOUS | Status: AC | PRN
  Administered 2016-03-27: 12.12 via INTRAVENOUS

## 2016-03-27 MED ORDER — REGADENOSON 0.4 MG/5ML IV SOLN
0.4000 mg | Freq: Once | INTRAVENOUS | Status: AC
  Administered 2016-03-27: 0.4 mg via INTRAVENOUS

## 2016-03-28 ENCOUNTER — Encounter: Payer: Self-pay | Admitting: Cardiovascular Disease

## 2016-03-28 ENCOUNTER — Ambulatory Visit (INDEPENDENT_AMBULATORY_CARE_PROVIDER_SITE_OTHER): Payer: Medicare Other | Admitting: Cardiovascular Disease

## 2016-03-28 VITALS — BP 120/78 | HR 57 | Ht 65.0 in | Wt 240.8 lb

## 2016-03-28 DIAGNOSIS — I209 Angina pectoris, unspecified: Secondary | ICD-10-CM

## 2016-03-28 DIAGNOSIS — I25118 Atherosclerotic heart disease of native coronary artery with other forms of angina pectoris: Secondary | ICD-10-CM

## 2016-03-28 DIAGNOSIS — R9439 Abnormal result of other cardiovascular function study: Secondary | ICD-10-CM

## 2016-03-28 DIAGNOSIS — I208 Other forms of angina pectoris: Secondary | ICD-10-CM

## 2016-03-28 DIAGNOSIS — E782 Mixed hyperlipidemia: Secondary | ICD-10-CM

## 2016-03-28 DIAGNOSIS — R9431 Abnormal electrocardiogram [ECG] [EKG]: Secondary | ICD-10-CM

## 2016-03-28 DIAGNOSIS — R0789 Other chest pain: Secondary | ICD-10-CM

## 2016-03-28 DIAGNOSIS — R079 Chest pain, unspecified: Secondary | ICD-10-CM | POA: Diagnosis not present
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
## 2016-03-28 NOTE — Patient Instructions (Addendum)
Medication Instructions:   No medication changes made  Labwork:  Need labs today,  Testing/Procedures:  We will schedule a cardiac cath for positive stress test, abn EKG, unstable angina symptoms, known CAD  [facility] Cardiac Cath Instructions   You are scheduled for a Cardiac Cath on:  Tuesday, January 16  Please arrive at 7:30 am on the day of your procedure  Do not empty your bladder the morning of your procedure, as you will need to provide a urine sample for a pregnancy test  Please expect a call from our [facility] to pre-register you  Do not eat/drink anything after midnight  Someone will need to drive you home  It is recommended someone be with you for the first 24 hours after your procedure  Wear clothes that are easy to get on/off and wear slip on shoes if possible  Medications bring a current list of all medications with you  _X_ You may take all of your medications the morning of your procedure with enough water to swallow safely  Day of your procedure: [facility] at [facility] entrance.  Free valet service is available.  After entering the [facility] please check-in at the registration desk (1st desk on your right) to receive your armband. After receiving your armband someone will escort you to the cardiac cath/special procedures waiting area.  The usual length of stay after your procedure is about 2 to 3 hours.  This can vary.  If you have any questions, please call our office at [phone], or you may call the cardiac cath lab at [facility] directly at [phone]   I recommend watching educational videos on topics of interest to you at:       [email or web address]  Enter code: HEARTCARE    Follow-Up: It was a pleasure seeing you in the office today. Please call us if you have new issues that need to be addressed before your next appt.  [phone]  Your physician wants you to follow-up in: 1 month.    If you need a refill on your  cardiac medications before your next appointment, please call your pharmacy.     Angiogram An angiogram is an X-ray test. It is used to look at your blood vessels. For this test, a dye is put into the blood vessel being checked. The dye shows up on X-rays. It helps your doctor see if there is a blockage or other problem in the blood vessel. What happens before the procedure?  Follow your doctor's instructions about limiting what you eat or drink.  Ask your doctor if you may drink enough water to take any needed medicines the morning of the test.  Plan to have someone take you home after the test.  If you go home the same day as the test, plan to have someone stay with you for 24 hours. What happens during the procedure?  An IV tube will be put into one of your veins.  You will be given a medicine that makes you relax (sedative).  Your skin will be washed where the thin tube (catheter) will be put in. Hair may be removed from this area. The tube may be put into:  Your upper leg area (groin).  The fold of your arm, near your elbow.  Your wrist.  You will be given a medicine that numbs the area where the tube will be inserted (local anesthetic).  The tube will be inserted into a blood vessel.  Using a type  of X-ray (fluoroscopy) to see, your doctor will move the tube into the blood vessel to check it.  Dye will be put in through the tube. X-rays of your blood vessels will then be taken. Different doctors and hospitals may do this procedure differently. What happens after the procedure?  If the test is done through the leg, you will be kept in bed lying flat for several hours. You will be told to not bend or cross your legs.  The area where the tube was inserted will be checked often.  The pulse in your feet or wrist will be checked often.  More tests or X-rays may be done. This information is not intended to replace advice given to you by your health care provider. Make  sure you discuss any questions you have with your health care provider. Document Released: 05/30/2008 Document Revised: 08/09/2015 Document Reviewed: 08/04/2012 Elsevier Interactive Patient Education  2017 [employer]  Angiogram, Care After These instructions give you information about caring for yourself after your procedure. Your doctor may also give you more specific instructions. Call your doctor if you have any problems or questions after your procedure. Follow these instructions at home:  Take medicines only as told by your doctor.  Follow your doctor's instructions about:  Care of the area where the tube was inserted.  Bandage (dressing) changes and removal.  You may shower 24-48 hours after the procedure or as told by your doctor.  Do not take baths, swim, or use a hot tub until your doctor approves.  Every day, check the area where the tube was inserted. Watch for:  Redness, swelling, or pain.  Fluid, blood, or pus.  Do not apply powder or lotion to the site.  Do not lift anything that is heavier than 10 lb (4.5 kg) for 5 days or as told by your doctor.  Ask your doctor when you can:  Return to work or school.  Do physical activities or play sports.  Have sex.  Do not drive or operate heavy machinery for 24 hours or as told by your doctor.  Have someone with you for the first 24 hours after the procedure.  Keep all follow-up visits as told by your doctor. This is important. Contact a health care provider if:  You have a fever.  You have chills.  You have more bleeding from the area where the tube was inserted. Hold pressure on the area.  You have redness, swelling, or pain in the area where the tube was inserted.  You have fluid or pus coming from the area. Get help right away if:  You have a lot of pain in the area where the tube was inserted.  The area where the tube was inserted is bleeding, and the bleeding does not stop after 30 minutes of  holding steady pressure on the area.  The area near or just beyond the insertion site becomes pale, cool, tingly, or numb. This information is not intended to replace advice given to you by your health care provider. Make sure you discuss any questions you have with your health care provider. Document Released: 05/30/2008 Document Revised: 08/09/2015 Document Reviewed: 08/04/2012 Elsevier Interactive Patient Education  2017 [employer].

## 2016-03-29 LAB — BASIC METABOLIC PANEL
BUN/Creatinine Ratio: 6 — ABNORMAL LOW (ref 9–23)
BUN: 5 mg/dL — ABNORMAL LOW (ref 6–24)
CO2: 25 mmol/L (ref 18–29)
Calcium: 9 mg/dL (ref 8.7–10.2)
Chloride: 102 mmol/L (ref 96–106)
Creatinine, Ser: 0.9 mg/dL (ref 0.57–1.00)
GFR calc Af Amer: 87 mL/min/{1.73_m2} (ref >59)
GFR calc non Af Amer: 76 mL/min/{1.73_m2} (ref >59)
GLUCOSE: 79 mg/dL (ref 65–99)
POTASSIUM: 3.7 mmol/L (ref 3.5–5.2)
SODIUM: 142 mmol/L (ref 134–144)

## 2016-03-29 LAB — CBC WITH DIFFERENTIAL/PLATELET
Basophils Absolute: 0 10*3/uL (ref 0.0–0.2)
Basos: 1 %
EOS (ABSOLUTE): 0.4 10*3/uL (ref 0.0–0.4)
Eos: 5 %
Hematocrit: 37.8 % (ref 34.0–46.6)
Hemoglobin: 12.8 g/dL (ref 11.1–15.9)
IMMATURE GRANULOCYTES: 0 %
Immature Grans (Abs): 0 10*3/uL (ref 0.0–0.1)
Lymphocytes Absolute: 2.9 10*3/uL (ref 0.7–3.1)
Lymphs: 38 %
MCH: 29.8 pg (ref 26.6–33.0)
MCHC: 33.9 g/dL (ref 31.5–35.7)
MCV: 88 fL (ref 79–97)
MONOS ABS: 0.5 10*3/uL (ref 0.1–0.9)
Monocytes: 7 %
NEUTROS PCT: 49 %
Neutrophils Absolute: 3.8 10*3/uL (ref 1.4–7.0)
PLATELETS: 272 10*3/uL (ref 150–379)
RBC: 4.29 x10E6/uL (ref 3.77–5.28)
RDW: 13.7 % (ref 12.3–15.4)
WBC: 7.6 10*3/uL (ref 3.4–10.8)

## 2016-03-29 LAB — PROTIME-INR
INR: 1.1 (ref 0.8–1.2)
PROTHROMBIN TIME: 11.2 s (ref 9.1–12.0)

## 2016-03-31 ENCOUNTER — Other Ambulatory Visit: Payer: Self-pay | Admitting: Cardiovascular Disease

## 2016-03-31 DIAGNOSIS — I200 Unstable angina: Secondary | ICD-10-CM

## 2016-04-01 ENCOUNTER — Ambulatory Visit: Admission: RE | Admit: 2016-04-01 | Payer: Medicare Other | Source: Ambulatory Visit | Admitting: Cardiovascular Disease

## 2016-04-01 ENCOUNTER — Telehealth: Payer: Self-pay | Admitting: Cardiovascular Disease

## 2016-04-01 ENCOUNTER — Encounter: Admission: RE | Payer: Self-pay | Source: Ambulatory Visit

## 2016-04-01 SURGERY — LEFT HEART CATH AND CORONARY ANGIOGRAPHY
Anesthesia: Moderate Sedation | Laterality: Left

## 2016-04-01 SURGERY — LEFT HEART CATH AND CORONARY ANGIOGRAPHY
Anesthesia: Moderate Sedation | Laterality: Bilateral

## 2016-04-01 NOTE — Telephone Encounter (Signed)
Recommended we try to reschedule for January 30 or 31st, even February 1

## 2016-04-01 NOTE — Telephone Encounter (Signed)
Pt did not show up for cardiac cath this am. Called her, she states that she was advised to call and let Dr. Mariah MillingGollan know whether or not she would be keeping this appt, "because of when my daughter's flight comes in". She states that she spoke w/ someone in pre-admit yesterday and cancelled the procedure, but I see no documentation of this. She states that she was rescheduled for next Tuesday.  Advised her that Dr. Mariah MillingGollan is not in the hospital at that time, but I will speak w/ him and call her back w/ a new date for her procedure.

## 2016-04-04 NOTE — Telephone Encounter (Signed)
Left message for pt to call back  °

## 2016-04-07 NOTE — Telephone Encounter (Signed)
Spoke w/ pt.  Cath resched for 1/31 @ 8:30, she understands to arrive @ 7:30. She wanted to make Dr. Mariah MillingGollan aware that she is having increased episodes of chest pain and is "eating nitro like candy". Advised her that if sx become emergent, to proceed to the ED and she can be cathed immediately. Pt states that the last time she went to the ED, she was sent home, therefore, she would like to wait until Dr. Mariah MillingGollan can cath her on 1/31. Reiterated to seek care if sx become emergent.

## 2016-04-15 ENCOUNTER — Other Ambulatory Visit: Payer: Self-pay | Admitting: Cardiovascular Disease

## 2016-04-15 ENCOUNTER — Telehealth: Payer: Self-pay | Admitting: Cardiovascular Disease

## 2016-04-15 DIAGNOSIS — I200 Unstable angina: Secondary | ICD-10-CM

## 2016-04-15 NOTE — Telephone Encounter (Signed)
Left detailed message on pt's vm reminding her of cardiac cath tomorrow. Asked her to arrive @ 7:30 and to call back if she will be unable to keep this appt.

## 2016-04-16 ENCOUNTER — Encounter: Payer: Self-pay | Admitting: *Deleted

## 2016-04-16 ENCOUNTER — Encounter
Admission: RE | Disposition: A | Discharge status: Home / Self Care | Payer: Self-pay | Source: Ambulatory Visit | Attending: Cardiovascular Disease

## 2016-04-16 ENCOUNTER — Other Ambulatory Visit: Payer: Self-pay | Admitting: Cardiovascular Disease

## 2016-04-16 ENCOUNTER — Ambulatory Visit
Admission: RE | Admit: 2016-04-16 | Discharge: 2016-04-16 | Disposition: A | Discharge status: Home / Self Care | Payer: Medicare Other | Source: Ambulatory Visit | Attending: Cardiovascular Disease | Admitting: Cardiovascular Disease

## 2016-04-16 DIAGNOSIS — Z7982 Long term (current) use of aspirin: Secondary | ICD-10-CM | POA: Diagnosis not present

## 2016-04-16 DIAGNOSIS — I200 Unstable angina: Secondary | ICD-10-CM

## 2016-04-16 DIAGNOSIS — J45909 Unspecified asthma, uncomplicated: Secondary | ICD-10-CM | POA: Diagnosis not present

## 2016-04-16 DIAGNOSIS — D509 Iron deficiency anemia, unspecified: Secondary | ICD-10-CM | POA: Insufficient documentation

## 2016-04-16 DIAGNOSIS — Z79891 Long term (current) use of opiate analgesic: Secondary | ICD-10-CM | POA: Diagnosis not present

## 2016-04-16 DIAGNOSIS — I472 Ventricular tachycardia: Secondary | ICD-10-CM

## 2016-04-16 DIAGNOSIS — Z8249 Family history of ischemic heart disease and other diseases of the circulatory system: Secondary | ICD-10-CM | POA: Insufficient documentation

## 2016-04-16 DIAGNOSIS — Z6841 Body Mass Index (BMI) 40.0 and over, adult: Secondary | ICD-10-CM | POA: Insufficient documentation

## 2016-04-16 DIAGNOSIS — I2510 Atherosclerotic heart disease of native coronary artery without angina pectoris: Secondary | ICD-10-CM | POA: Diagnosis present

## 2016-04-16 DIAGNOSIS — G8929 Other chronic pain: Secondary | ICD-10-CM | POA: Insufficient documentation

## 2016-04-16 DIAGNOSIS — Z79899 Other long term (current) drug therapy: Secondary | ICD-10-CM | POA: Insufficient documentation

## 2016-04-16 DIAGNOSIS — M1990 Unspecified osteoarthritis, unspecified site: Secondary | ICD-10-CM | POA: Diagnosis not present

## 2016-04-16 DIAGNOSIS — I25118 Atherosclerotic heart disease of native coronary artery with other forms of angina pectoris: Secondary | ICD-10-CM | POA: Diagnosis not present

## 2016-04-16 DIAGNOSIS — Z9884 Bariatric surgery status: Secondary | ICD-10-CM | POA: Diagnosis not present

## 2016-04-16 DIAGNOSIS — R9439 Abnormal result of other cardiovascular function study: Secondary | ICD-10-CM

## 2016-04-16 DIAGNOSIS — I25110 Atherosclerotic heart disease of native coronary artery with unstable angina pectoris: Secondary | ICD-10-CM | POA: Insufficient documentation

## 2016-04-16 DIAGNOSIS — E669 Obesity, unspecified: Secondary | ICD-10-CM | POA: Diagnosis not present

## 2016-04-16 DIAGNOSIS — I209 Angina pectoris, unspecified: Secondary | ICD-10-CM | POA: Diagnosis present

## 2016-04-16 DIAGNOSIS — R079 Chest pain, unspecified: Secondary | ICD-10-CM

## 2016-04-16 DIAGNOSIS — I4729 Other ventricular tachycardia: Secondary | ICD-10-CM

## 2016-04-16 HISTORY — DX: Essential (primary) hypertension: I10

## 2016-04-16 HISTORY — PX: CARDIAC CATHETERIZATION: SHX172

## 2016-04-16 SURGERY — LEFT HEART CATH AND CORONARY ANGIOGRAPHY
Anesthesia: Moderate Sedation | Laterality: Bilateral

## 2016-04-16 SURGERY — LEFT HEART CATH AND CORONARY ANGIOGRAPHY
Anesthesia: Moderate Sedation | Laterality: Left

## 2016-04-16 MED ORDER — HEPARIN (PORCINE) IN NACL 2-0.9 UNIT/ML-% IJ SOLN
INTRAMUSCULAR | Status: AC
  Filled 2016-04-16: qty 500

## 2016-04-16 MED ORDER — SODIUM CHLORIDE 0.9 % WEIGHT BASED INFUSION: 1.0000 mL/kg/h | INTRAVENOUS | Status: DC

## 2016-04-16 MED ORDER — ASPIRIN 81 MG PO CHEW
81.0000 mg | CHEWABLE_TABLET | ORAL | Status: AC
  Administered 2016-04-16: 81 mg via ORAL

## 2016-04-16 MED ORDER — MIDAZOLAM HCL 2 MG/2ML IJ SOLN
INTRAMUSCULAR | Status: DC | PRN
  Administered 2016-04-16 (×2): 1 mg via INTRAVENOUS

## 2016-04-16 MED ORDER — ISOSORBIDE MONONITRATE ER 30 MG PO TB24: 30.0000 mg | ORAL_TABLET | Freq: Every day | ORAL | 6 refills | Status: DC

## 2016-04-16 MED ORDER — FENTANYL CITRATE (PF) 100 MCG/2ML IJ SOLN
INTRAMUSCULAR | Status: DC | PRN
  Administered 2016-04-16 (×2): 50 ug via INTRAVENOUS

## 2016-04-16 MED ORDER — SODIUM CHLORIDE 0.9 % WEIGHT BASED INFUSION
3.0000 mL/kg/h | INTRAVENOUS | Status: DC
  Administered 2016-04-16: 3 mL/kg/h via INTRAVENOUS

## 2016-04-16 MED ORDER — MIDAZOLAM HCL 2 MG/2ML IJ SOLN
INTRAMUSCULAR | Status: AC
  Filled 2016-04-16: qty 2

## 2016-04-16 MED ORDER — IOPAMIDOL (ISOVUE-300) INJECTION 61%
INTRAVENOUS | Status: DC | PRN
  Administered 2016-04-16: 130 mL via INTRA_ARTERIAL

## 2016-04-16 MED ORDER — ASPIRIN 81 MG PO CHEW
CHEWABLE_TABLET | ORAL | Status: AC
  Filled 2016-04-16: qty 1

## 2016-04-16 MED ORDER — FENTANYL CITRATE (PF) 100 MCG/2ML IJ SOLN
INTRAMUSCULAR | Status: AC
  Filled 2016-04-16: qty 2

## 2016-04-16 SURGICAL SUPPLY — 11 items
CATH INFINITI 5FR ANG PIGTAIL (CATHETERS) ×2 IMPLANT
CATH INFINITI 5FR JL4 (CATHETERS) ×2 IMPLANT
CATH INFINITI JR4 5F (CATHETERS) ×2 IMPLANT
DEVICE CLOSURE MYNXGRIP 5F (Vascular Products) ×2 IMPLANT
KIT MANI 3VAL PERCEP (MISCELLANEOUS) ×2 IMPLANT
NEEDLE PERC 18GX7CM (NEEDLE) ×2 IMPLANT
NEEDLE SMART REG 18GX2-3/4 (NEEDLE) ×2 IMPLANT
PACK CARDIAC CATH (CUSTOM PROCEDURE TRAY) ×2 IMPLANT
SHEATH AVANTI 5FR X 11CM (SHEATH) ×2 IMPLANT
TUBING CIL FLEX 10 FLL-RA (TUBING) ×2 IMPLANT
WIRE EMERALD 3MM-J .035X150CM (WIRE) ×2 IMPLANT

## 2016-04-29 ENCOUNTER — Encounter: Payer: Self-pay | Admitting: *Deleted

## 2016-04-29 ENCOUNTER — Ambulatory Visit: Payer: Medicare Other | Admitting: Cardiovascular Disease

## 2016-06-11 ENCOUNTER — Encounter: Payer: Self-pay | Admitting: *Deleted

## 2016-06-11 ENCOUNTER — Emergency Department
Admission: EM | Admit: 2016-06-11 | Discharge: 2016-06-11 | Disposition: A | Discharge status: Home / Self Care | Payer: Medicare Other | Attending: Emergency Medicine | Admitting: Emergency Medicine

## 2016-06-11 ENCOUNTER — Encounter: Payer: Self-pay | Admitting: Anesthesiology

## 2016-06-11 ENCOUNTER — Encounter
Admission: EM | Disposition: A | Discharge status: Home / Self Care | Payer: Self-pay | Source: Home / Self Care | Attending: Emergency Medicine

## 2016-06-11 DIAGNOSIS — Z79899 Other long term (current) drug therapy: Secondary | ICD-10-CM | POA: Diagnosis not present

## 2016-06-11 DIAGNOSIS — I2510 Atherosclerotic heart disease of native coronary artery without angina pectoris: Secondary | ICD-10-CM | POA: Insufficient documentation

## 2016-06-11 DIAGNOSIS — I10 Essential (primary) hypertension: Secondary | ICD-10-CM | POA: Diagnosis not present

## 2016-06-11 DIAGNOSIS — T18108A Unspecified foreign body in esophagus causing other injury, initial encounter: Secondary | ICD-10-CM

## 2016-06-11 DIAGNOSIS — Y9389 Activity, other specified: Secondary | ICD-10-CM | POA: Insufficient documentation

## 2016-06-11 DIAGNOSIS — Y999 Unspecified external cause status: Secondary | ICD-10-CM | POA: Insufficient documentation

## 2016-06-11 DIAGNOSIS — X58XXXA Exposure to other specified factors, initial encounter: Secondary | ICD-10-CM | POA: Insufficient documentation

## 2016-06-11 DIAGNOSIS — J45909 Unspecified asthma, uncomplicated: Secondary | ICD-10-CM | POA: Diagnosis not present

## 2016-06-11 DIAGNOSIS — Y929 Unspecified place or not applicable: Secondary | ICD-10-CM | POA: Insufficient documentation

## 2016-06-11 LAB — BASIC METABOLIC PANEL
Anion gap: 7 (ref 5–15)
BUN: 9 mg/dL (ref 6–20)
CO2: 28 mmol/L (ref 22–32)
CREATININE: 0.93 mg/dL (ref 0.44–1.00)
Calcium: 9 mg/dL (ref 8.9–10.3)
Chloride: 103 mmol/L (ref 101–111)
Glucose, Bld: 82 mg/dL (ref 65–99)
POTASSIUM: 4.1 mmol/L (ref 3.5–5.1)
SODIUM: 138 mmol/L (ref 135–145)

## 2016-06-11 LAB — CBC
HEMATOCRIT: 40.6 % (ref 35.0–47.0)
HEMOGLOBIN: 13.9 g/dL (ref 12.0–16.0)
MCH: 30.4 pg (ref 26.0–34.0)
MCHC: 34.3 g/dL (ref 32.0–36.0)
MCV: 88.5 fL (ref 80.0–100.0)
PLATELETS: 278 10*3/uL (ref 150–440)
RBC: 4.58 MIL/uL (ref 3.80–5.20)
RDW: 12.7 % (ref 11.5–14.5)
WBC: 9.8 10*3/uL (ref 3.6–11.0)

## 2016-06-11 SURGERY — EGD (ESOPHAGOGASTRODUODENOSCOPY)
Anesthesia: Choice

## 2016-06-11 MED ORDER — PROPOFOL 10 MG/ML IV BOLUS
INTRAVENOUS | Status: AC
  Filled 2016-06-11: qty 20

## 2016-06-11 MED ORDER — GLUCAGON HCL (RDNA) 1 MG IJ SOLR
1.0000 mg | Freq: Once | INTRAMUSCULAR | Status: AC
Start: 2016-06-11 — End: 2016-06-11
  Administered 2016-06-11: 1 mg via INTRAVENOUS
  Filled 2016-06-11: qty 1

## 2016-06-11 MED ORDER — ONDANSETRON HCL 4 MG/2ML IJ SOLN
4.0000 mg | Freq: Once | INTRAMUSCULAR | Status: AC
  Administered 2016-06-11: 4 mg via INTRAVENOUS
  Filled 2016-06-11: qty 2

## 2016-06-11 MED ORDER — NITROGLYCERIN 0.4 MG SL SUBL
SUBLINGUAL_TABLET | SUBLINGUAL | Status: AC
  Filled 2016-06-11: qty 1

## 2016-06-11 MED ORDER — SUCCINYLCHOLINE CHLORIDE 20 MG/ML IJ SOLN
INTRAMUSCULAR | Status: AC
  Filled 2016-06-11: qty 1

## 2016-06-11 MED ORDER — GLUCAGON HCL RDNA (DIAGNOSTIC) 1 MG IJ SOLR
INTRAMUSCULAR | Status: AC
  Administered 2016-06-11: 1 mg via INTRAVENOUS
  Filled 2016-06-11: qty 1

## 2016-06-11 NOTE — ED Triage Notes (Signed)
Pt to room 25 from stat desk.  Pt has hotdog stuck in throat for 30 minutes.  Pt spitting up phlegm.  Pt alert.

## 2016-06-11 NOTE — OR Nursing (Signed)
Dr. Karlton LemonKarenz notified that pregnancy test had not been completed. Due to age and still having menstrual cycles. Verbal order obtained for pregnancy test. Erie NoeVanessa RN in ED notified

## 2016-06-11 NOTE — ED Provider Notes (Addendum)
[facility] Emergency Department Provider Note  ____________________________________________   First MD Initiated Contact with Patient 06/11/16 1951     (approximate)  I have reviewed the triage vital signs and the nursing notes.   HISTORY  Chief Complaint Foreign Body   HPI Cheyenne Gray is a 51 y.o. female who feels that she has a hot dog stuck in her throat. The patient ate this hotdog about 30 minutes prior to arrival and has not been able to take any fluids down. She has tried warm coke as well as water without any relief. She says that the foreign body feels present at the base of her neck. She says that she often has difficulty swallowing things and appeared about her Gelusil was stuck in her throat several months ago when she presented to the emergency department.   Past Medical History:  Diagnosis Date  . Anginal pain (HCC)   . Arthritis   . Asthma   . B12 deficiency   . Chronic back pain   . Chronic headaches   . Chronic pain    a. on methadone  . Depression   . DJD (degenerative joint disease)   . DJD (degenerative joint disease), multiple sites   . Hand, foot and mouth disease 2016  . History of echocardiogram    a. echo 2014: EF 65-70%, nl WM, mildly dilated LA, PASP nl  . History of shingles   . Hypertension   . Iron deficiency   . Iron deficiency anemia   . Long QT interval   . Methadone use (HCC)    managed by Dr. Metta Clines  . Migraine   . Obesity   . Palpitations    a. 24 hour Holter: NSR, sinus brady down to 48, occasional PVCs & couplets, 8 beats NSVT; b. 30 day event monitor 2015: NSR with rare PVC  . Psoriasis   . Syncope and collapse   . Vitamin D deficiency     Patient Active Problem List   Diagnosis Date Noted  . Chest pain   . Positive cardiac stress test   . Spinal stenosis, lumbar region, with neurogenic claudication 10/18/2015  . Lumbar radiculopathy 10/18/2015  . Migraine 09/21/2015  . Chronic  tension-type headache, intractable 08/08/2015  . Medication overuse headache 08/08/2015  . Bilateral occipital neuralgia 06/04/2015  . Migraine headache 06/04/2015  . Angina pectoris (HCC) 02/07/2015  . IDA (iron deficiency anemia) 09/19/2014  . 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 DDD (degenerative disc disease), lumbar 09/14/2014  . Sacroiliac joint dysfunction 09/14/2014  . Facet syndrome, lumbar 09/14/2014  . Mixed hyperlipidemia 01/25/2014  . PVC (premature ventricular contraction) 01/10/2014  . Fatigue 05/25/2013  . CAD (coronary artery disease) 04/22/2013  . PVD (peripheral vascular disease) (HCC) 04/22/2013  . Long Q-T syndrome 07/09/2012  . Chest pressure 07/09/2012  . NSVT (nonsustained ventricular tachycardia) (HCC) 07/09/2012  . Syncope 07/09/2012  . MUSCLE STRAIN 07/20/2009    Past Surgical History:  Procedure Laterality Date  . BARIATRIC SURGERY  2001  . CARDIAC CATHETERIZATION Left 04/16/2016   Procedure: Left Heart Cath and Coronary Angiography;  Surgeon: Antonieta Iba, MD;  Location: [facility] INVASIVE CV LAB;  Service: Cardiovascular;  Laterality: Left;  . CHOLECYSTECTOMY  2001  . GALLBLADDER SURGERY    . GASTRIC BYPASS    . GASTROPLASTY      Prior to Admission medications   Medication Sig Start Date End Date Taking? Authorizing Provider  Black Cohosh 175 MG CAPS  Take 1 capsule by mouth daily.   Yes Historical Provider, MD  citalopram (CELEXA) 40 MG tablet Take 40 mg by mouth daily. 04/30/16  Yes Historical Provider, MD  clobetasol cream (TEMOVATE) 0.05 % Apply 1 application topically 2 (two) times daily as needed (PSORIASIS).   Yes Historical Provider, MD  cloNIDine (CATAPRES) 0.1 MG tablet Take 0.1 mg by mouth daily.   Yes Historical Provider, MD  gabapentin (NEURONTIN) 400 MG capsule Limit 2 tablets in the a.m. and midday and 3 tablets each evening    Please dispense a three-month supply 11/15/15  Yes Ewing Schlein, MD  hydrOXYzine (VISTARIL)  25 MG capsule Take 25 mg by mouth 3 (three) times daily as needed.   Yes Historical Provider, MD  isosorbide mononitrate (IMDUR) 30 MG 24 hr tablet Take 1 tablet (30 mg total) by mouth daily. 04/16/16 04/16/17 Yes Antonieta Iba, MD  methadone (DOLOPHINE) 10 MG tablet Limit 1-2 tablets by mouth 2-3 times per day if tolerated 11/15/15  Yes Ewing Schlein, MD  nitroGLYCERIN (NITROSTAT) 0.4 MG SL tablet Place 1 tablet (0.4 mg total) under the tongue every 5 (five) minutes as needed for chest pain. 12/07/15 12/06/16 Yes Antonieta Iba, MD  PARoxetine (PAXIL) 20 MG tablet Take 20 mg by mouth daily.    Yes Historical Provider, MD  rOPINIRole (REQUIP) 0.25 MG tablet Take 0.5-0.75 mg by mouth at bedtime.    Yes Historical Provider, MD  rosuvastatin (CRESTOR) 10 MG tablet Take 1 tablet (10 mg total) by mouth daily. 12/07/15 12/06/16 Yes Antonieta Iba, MD  tizanidine (ZANAFLEX) 2 MG capsule Take 2 mg by mouth as needed for muscle spasms.   Yes Historical Provider, MD  albuterol (PROVENTIL HFA;VENTOLIN HFA) 108 (90 BASE) MCG/ACT inhaler Inhale 2 puffs into the lungs as needed for wheezing.    Historical Provider, MD    Allergies Penicillins  Family History  Problem Relation Age of Onset  . Heart attack Mother   . Cancer Mother   . Heart attack Father 34    MI  . Heart attack Brother   . Heart disease Brother   . Heart attack Maternal Grandmother   . Cancer Maternal Grandmother   . Cancer Maternal Uncle     Social History Social History  Substance Use Topics  . Smoking status: Never Smoker  . Smokeless tobacco: Never Used  . Alcohol use No    Review of Systems Constitutional: No fever/chills Eyes: No visual changes. ENT:asa bove Cardiovascular: Denies chest pain. Respiratory: Denies shortness of breath. Gastrointestinal: No abdominal pain.    No diarrhea.  No constipation. Genitourinary: Negative for dysuria. Musculoskeletal: Negative for back pain. Skin: Negative for  rash. Neurological: Negative for headaches, focal weakness or numbness.  10-point ROS otherwise negative.  ____________________________________________   PHYSICAL EXAM:  VITAL SIGNS: ED Triage Vitals  Enc Vitals Group     BP 06/11/16 1957 118/80     Pulse Rate 06/11/16 1957 80     Resp 06/11/16 1957 16     Temp 06/11/16 1957 97.7 F (36.5 C)     Temp Source 06/11/16 1957 Oral     SpO2 06/11/16 1957 98 %     Weight 06/11/16 1945 220 lb (99.8 kg)     Height 06/11/16 1945 5\' 5"  (1.651 m)     Head Circumference --      Peak Flow --      Pain Score --      Pain Loc --  Pain Edu? --      Excl. in GC? --     Constitutional: Alert and oriented. Patient sitting on the side of the bed. Spitting into a bag. Eyes: Conjunctivae are normal. PERRL. EOMI. Head: Atraumatic. Nose: No congestion/rhinnorhea. Mouth/Throat: Mucous membranes are moist.  Oropharynx non-erythematous.  No foreign body visualized. Neck: No stridor.   Cardiovascular: Normal rate, regular rhythm. Grossly normal heart sounds.   Respiratory: Normal respiratory effort.  No retractions. Lungs CTAB. Gastrointestinal: Soft and nontender. No distention.  Musculoskeletal: No lower extremity tenderness nor edema.  No joint effusions. Neurologic:  Normal speech and language. No gross focal neurologic deficits are appreciated.  Skin:  Skin is warm, dry and intact. No rash noted. Psychiatric: Mood and affect are normal. Speech and behavior are normal.  ____________________________________________   LABS (all labs ordered are listed, but only abnormal results are displayed)  Labs Reviewed  CBC  BASIC METABOLIC PANEL   ____________________________________________  EKG   ____________________________________________  RADIOLOGY   ____________________________________________   PROCEDURES  Procedure(s) performed:   Procedures  Critical Care performed:    ____________________________________________   INITIAL IMPRESSION / ASSESSMENT AND PLAN / ED COURSE  Pertinent labs & imaging results that were available during my care of the patient were reviewed by me and considered in my medical decision making (see chart for details).  ----------------------------------------- 8:40 PM on 06/11/2016 -----------------------------------------  Patient without any relief with sips of warm coke. Given glucagon and says that she feels some improvement but still difficulty with swallowing and feels torn body sensation not deeper in the esophagus. We will continue with recommendation for endoscopy. Discussed the case with Dr. Tobi BastosAnna of gastroenterology who will be scoping the patient. The patient is understanding of this plan and is willing to comply.      ____________________________________________   FINAL CLINICAL IMPRESSION(S) / ED DIAGNOSES  Esophageal foreign body.    NEW MEDICATIONS STARTED DURING THIS VISIT:  New Prescriptions   No medications on file     Note:  This document was prepared using Dragon voice recognition software and may include unintentional dictation errors.    Myrna Blazeravid Matthew Schaevitz, MD 06/11/16 2041  Patient now feeling back to baseline. She had vomited once but is now tolerating ginger ale.  Says his belching as normal. Says that she feels completely back to her baseline. I will give her follow-up with Dr. Tobi BastosAnna of gastrology she says she has had multiple issues with swallowing in the past with foreign body impaction in the esophagus. She'll be discharged home. We discussed return precautions including any worsening or concerning symptoms especially vomiting and pain. She is understanding of this plan and willing to comply.    Myrna Blazeravid Matthew Schaevitz, MD 06/11/16 2239

## 2016-06-11 NOTE — ED Notes (Signed)
Pt refusing to stay for foreign body removal procedure. MD made aware or pts decisions. Pt education provided by MD.

## 2016-06-11 NOTE — ED Notes (Signed)
All clothing and jewelry removed.

## 2016-06-11 NOTE — ED Notes (Signed)
Patient discharged to home per MD order. Patient in stable condition, and deemed medically cleared by ED provider for discharge. Discharge instructions reviewed with patient/family using "Teach Back"; verbalized understanding of medication education and administration, and information about follow-up care. Denies further concerns. ° °

## 2016-06-24 ENCOUNTER — Ambulatory Visit: Payer: Medicare Other | Admitting: Internal Medicine
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
## 2016-10-02 ENCOUNTER — Ambulatory Visit (INDEPENDENT_AMBULATORY_CARE_PROVIDER_SITE_OTHER): Payer: Medicare Other | Admitting: Cardiovascular Disease

## 2016-10-02 ENCOUNTER — Encounter: Payer: Self-pay | Admitting: Cardiovascular Disease

## 2016-10-02 VITALS — BP 124/12 | HR 53 | Ht 64.0 in | Wt 234.0 lb

## 2016-10-02 DIAGNOSIS — I200 Unstable angina: Secondary | ICD-10-CM

## 2016-10-02 DIAGNOSIS — I209 Angina pectoris, unspecified: Secondary | ICD-10-CM

## 2016-10-02 DIAGNOSIS — R0789 Other chest pain: Secondary | ICD-10-CM | POA: Diagnosis not present

## 2016-10-02 DIAGNOSIS — E782 Mixed hyperlipidemia: Secondary | ICD-10-CM | POA: Diagnosis not present

## 2016-10-02 DIAGNOSIS — I739 Peripheral vascular disease, unspecified: Secondary | ICD-10-CM

## 2016-10-02 DIAGNOSIS — I25118 Atherosclerotic heart disease of native coronary artery with other forms of angina pectoris: Secondary | ICD-10-CM

## 2016-10-02 NOTE — Patient Instructions (Addendum)
Medication Instructions:   No medication changes made  Labwork:  Liver and lipids  Testing/Procedures:  No further testing at this time   Follow-Up: It was a pleasure seeing you in the office today. Please call us if you have new issues that need to be addressed before your next appt.  [phone]  Your physician wants you to follow-up in: 12 months.    If you need a refill on your cardiac medications before your next appointment, please call your pharmacy.

## 2016-10-03 LAB — LIPID PANEL
CHOLESTEROL TOTAL: 114 mg/dL (ref 100–199)
Chol/HDL Ratio: 2.1 ratio (ref 0.0–4.4)
HDL: 55 mg/dL (ref >39)
LDL CALC: 47 mg/dL (ref 0–99)
Triglycerides: 59 mg/dL (ref 0–149)
VLDL CHOLESTEROL CAL: 12 mg/dL (ref 5–40)

## 2016-10-03 LAB — HEPATIC FUNCTION PANEL
ALT: 27 IU/L (ref 0–32)
AST: 52 IU/L — ABNORMAL HIGH (ref 0–40)
Albumin: 3.7 g/dL (ref 3.5–5.5)
Alkaline Phosphatase: 152 IU/L — ABNORMAL HIGH (ref 39–117)
BILIRUBIN, DIRECT: 0.09 mg/dL (ref 0.00–0.40)
Bilirubin Total: 0.3 mg/dL (ref 0.0–1.2)
TOTAL PROTEIN: 6.6 g/dL (ref 6.0–8.5)

## 2016-10-16 ENCOUNTER — Other Ambulatory Visit: Payer: Self-pay | Admitting: Cardiovascular Disease

## 2016-10-28 ENCOUNTER — Other Ambulatory Visit: Payer: Self-pay | Admitting: Anesthesiology

## 2016-10-28 DIAGNOSIS — M5440 Lumbago with sciatica, unspecified side: Secondary | ICD-10-CM

## 2016-11-03 ENCOUNTER — Ambulatory Visit: Payer: Medicare Other

## 2016-11-11 ENCOUNTER — Other Ambulatory Visit: Payer: Self-pay | Admitting: Cardiovascular Disease

## 2016-11-11 ENCOUNTER — Ambulatory Visit: Payer: Medicare Other

## 2016-11-14 ENCOUNTER — Ambulatory Visit
Admission: RE | Admit: 2016-11-14 | Discharge: 2016-11-14 | Disposition: A | Discharge status: Home / Self Care | Payer: Medicare Other | Source: Ambulatory Visit | Attending: Anesthesiology | Admitting: Anesthesiology

## 2016-11-14 DIAGNOSIS — M5126 Other intervertebral disc displacement, lumbar region: Secondary | ICD-10-CM | POA: Insufficient documentation

## 2016-11-14 DIAGNOSIS — M5137 Other intervertebral disc degeneration, lumbosacral region: Secondary | ICD-10-CM | POA: Insufficient documentation

## 2016-11-14 DIAGNOSIS — M5440 Lumbago with sciatica, unspecified side: Secondary | ICD-10-CM | POA: Insufficient documentation

## 2016-11-14 DIAGNOSIS — M48061 Spinal stenosis, lumbar region without neurogenic claudication: Secondary | ICD-10-CM | POA: Insufficient documentation

## 2016-11-14 DIAGNOSIS — M2578 Osteophyte, vertebrae: Secondary | ICD-10-CM | POA: Diagnosis not present

## 2016-12-26 ENCOUNTER — Other Ambulatory Visit: Payer: Self-pay | Admitting: Family Medicine

## 2016-12-26 ENCOUNTER — Telehealth: Payer: Self-pay | Admitting: Cardiovascular Disease

## 2016-12-26 DIAGNOSIS — S22009D Unspecified fracture of unspecified thoracic vertebra, subsequent encounter for fracture with routine healing: Secondary | ICD-10-CM

## 2016-12-26 NOTE — Telephone Encounter (Signed)
Left voicemail message to call back  

## 2016-12-26 NOTE — Telephone Encounter (Signed)
Pt c/o swelling: STAT is pt has developed SOB within 24 hours  1) How much weight have you gained and in what time span? No   2) If swelling, where is the swelling located? BLE swelling down to the ankles   3) Are you currently taking a fluid pill? Stopped taking over the counter pill 2 weeks ago   4) Are you currently SOB? Occasionally   5) Do you have a log of your daily weights (if so, list)?    No   6) Have you gained 3 pounds in a day or 5 pounds in a week? No   7) Have you traveled recently? No

## 2016-12-30 NOTE — Telephone Encounter (Signed)
Patient not available. Family member took a message and will let her know I called.

## 2016-12-31 NOTE — Telephone Encounter (Signed)
Left voicemail message for patient to call back.

## 2017-01-02 NOTE — Telephone Encounter (Signed)
Left detailed voicemail message to call back if she has any continued concerns. We have left multiple messages and have been unable to reach her. Closing this note and will reopen if she should call back.

## 2017-01-26 ENCOUNTER — Other Ambulatory Visit: Payer: Medicare Other

## 2017-02-14 ENCOUNTER — Other Ambulatory Visit: Payer: Self-pay | Admitting: Cardiovascular Disease

## 2017-03-02 ENCOUNTER — Telehealth: Payer: Self-pay | Admitting: Cardiovascular Disease

## 2017-03-02 NOTE — Telephone Encounter (Signed)
Spoke with the patient.  She reports that she has had intermittent chest pain, about every couple of days for the last 7-10 days.  Last episode was today. She reports chest pain as midsternal (pain vs pressure).  She finds that when the pain occurs, she seems to hold her breath as it is worse with deep breathing.  She reports some radiation of the pain to underneath her left armpit.  She did report that she missed her imdur for about 3-4 days and just restarted it on Friday. Her night time routine was different and this is why she missed her doses. She states she did take SL NTG x 1 dose today with relief of symptoms.  She is currently pain free.  She states she was told she had a mild MI on 03/16/16 when she went to the ER with symptoms- however present symptoms have not been as bad as those almost a year ago.  She was cathed in January 2018: Cardiac cath 03/2016  Prox LAD to Mid LAD lesion, 40 %stenosed.  Mid LAD lesion, 30 %stenosed.  Mid RCA lesion, 20 %stenosed.  Dist RCA lesion, 20 %stenosed.  The left ventricular systolic function is normal.  LV end diastolic pressure is normal.  The left ventricular ejection fraction is 55-65% by visual estimate.  She is scheduled to follow up with Alycia Rossettiyan, PA on 03/31/17.  I advised I will review symptoms with Dr. Kirke CorinArida and call her back with any recommendations. She is aware if symptoms worsen/ persists/ she require NTG X 3 doses, she needs to report to the ER for further evaluation and treatment.  She is agreeable.

## 2017-03-02 NOTE — Telephone Encounter (Signed)
Her cardiac catheterization earlier this year was unremarkable.  I suggest that she continues to take Imdur, avoid stress, and monitor symptoms.

## 2017-03-02 NOTE — Telephone Encounter (Signed)
Pt c/o of Chest Pain: STAT if CP now or developed within 24 hours  1. Are you having CP right now? Some pain but it is subsiding  2. Are you experiencing any other symptoms (ex. SOB, nausea, vomiting, sweating)? Chest pain going to shoulder- some sweating but could be hot flashes   3. How long have you been experiencing CP? About a week   4. Is your CP continuous or coming and going? Off and on   5. Have you taken Nitroglycerin? Yes ?

## 2017-03-03 NOTE — Telephone Encounter (Signed)
I left a message for the patient to call. 

## 2017-03-04 NOTE — Telephone Encounter (Signed)
I spoke with the patient- she is aware of Dr. Jari SportsmanArida's recommendations and verbalizes understanding.

## 2017-03-20 ENCOUNTER — Other Ambulatory Visit: Payer: Self-pay | Admitting: Cardiovascular Disease

## 2017-03-22 IMAGING — CR DG CHEST 2V
2 series · 2 of 2 positions shown · non-contrast
Comparison: None.

CLINICAL DATA: Chest pressure that started yesterday and has gotten
worse today.

EXAM:
CHEST  2 VIEW

[chest pa]
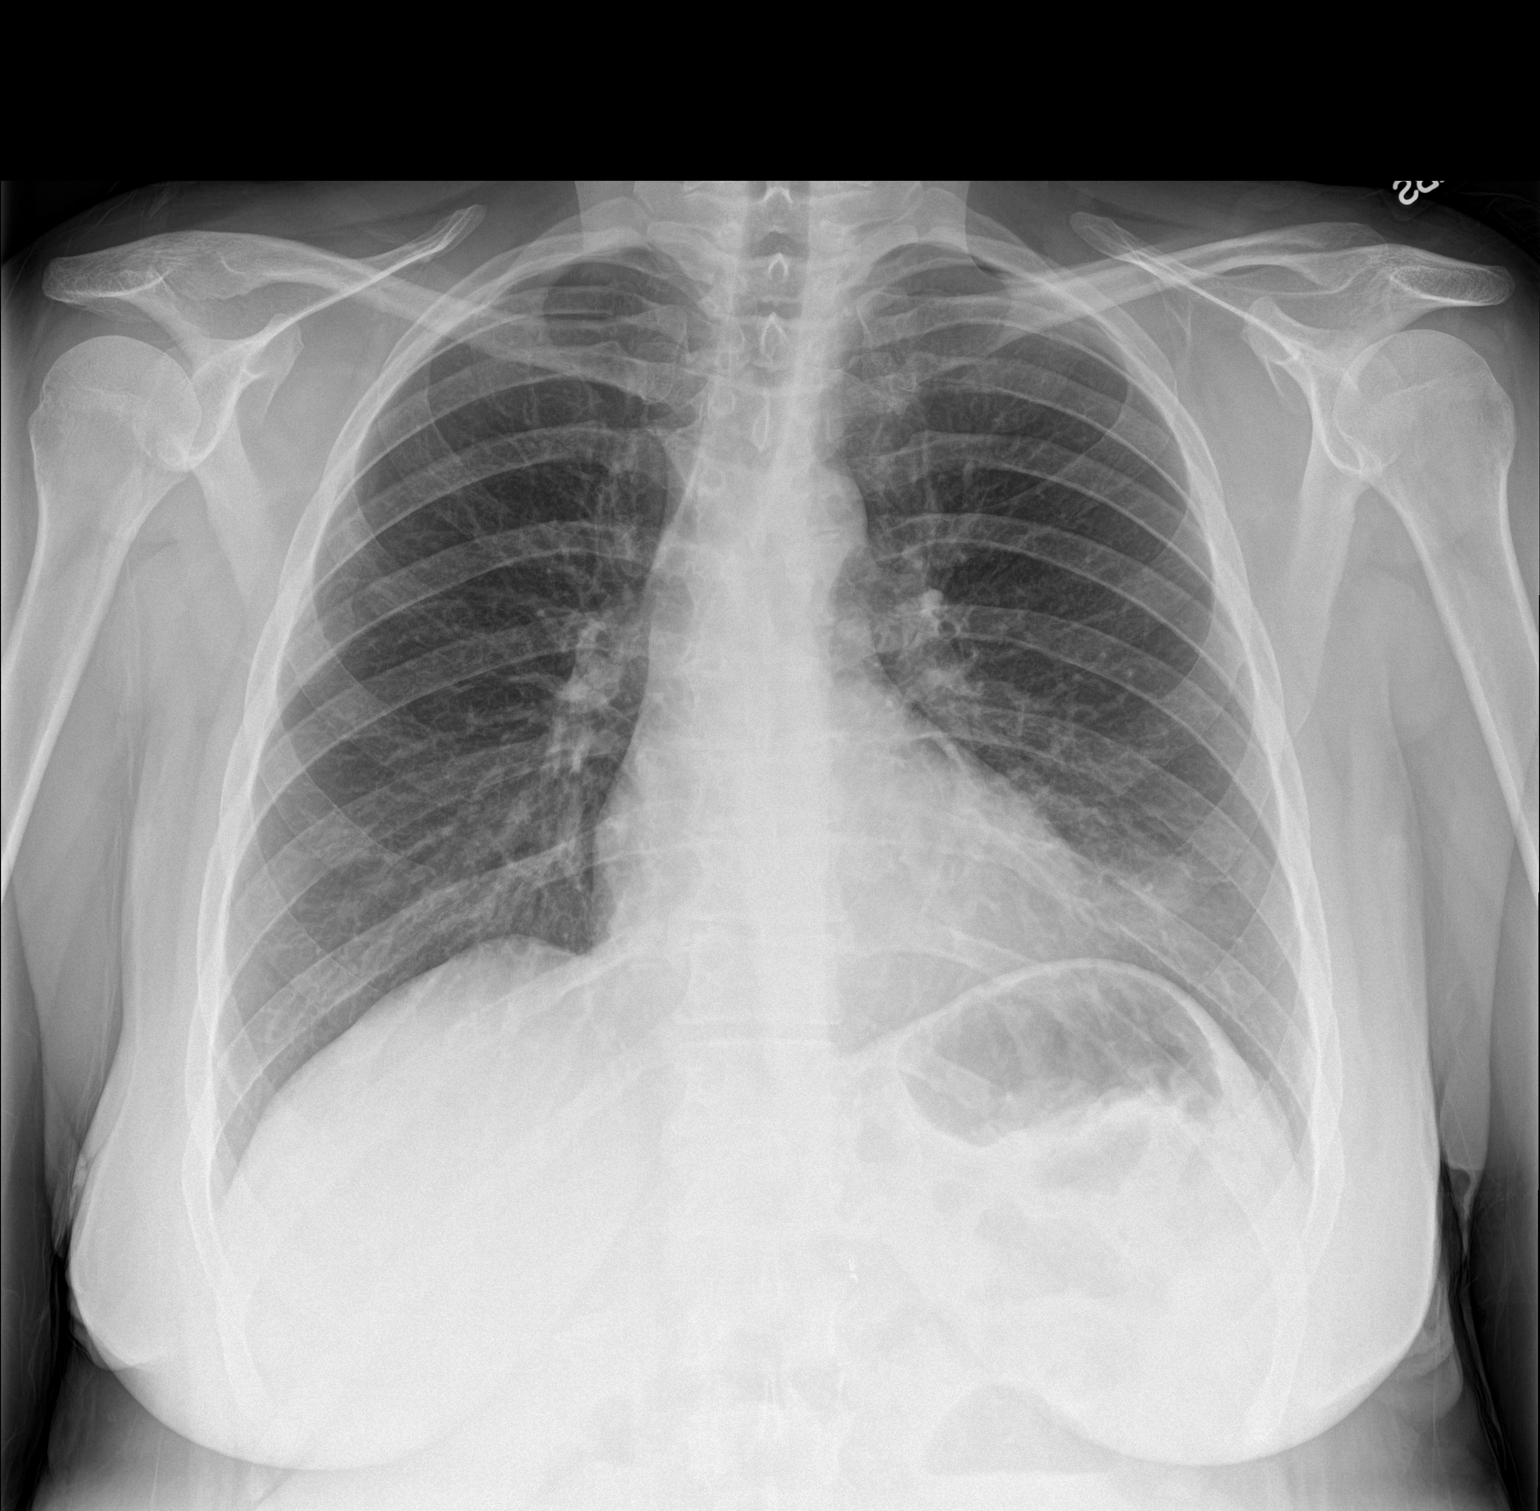

[chest lat]
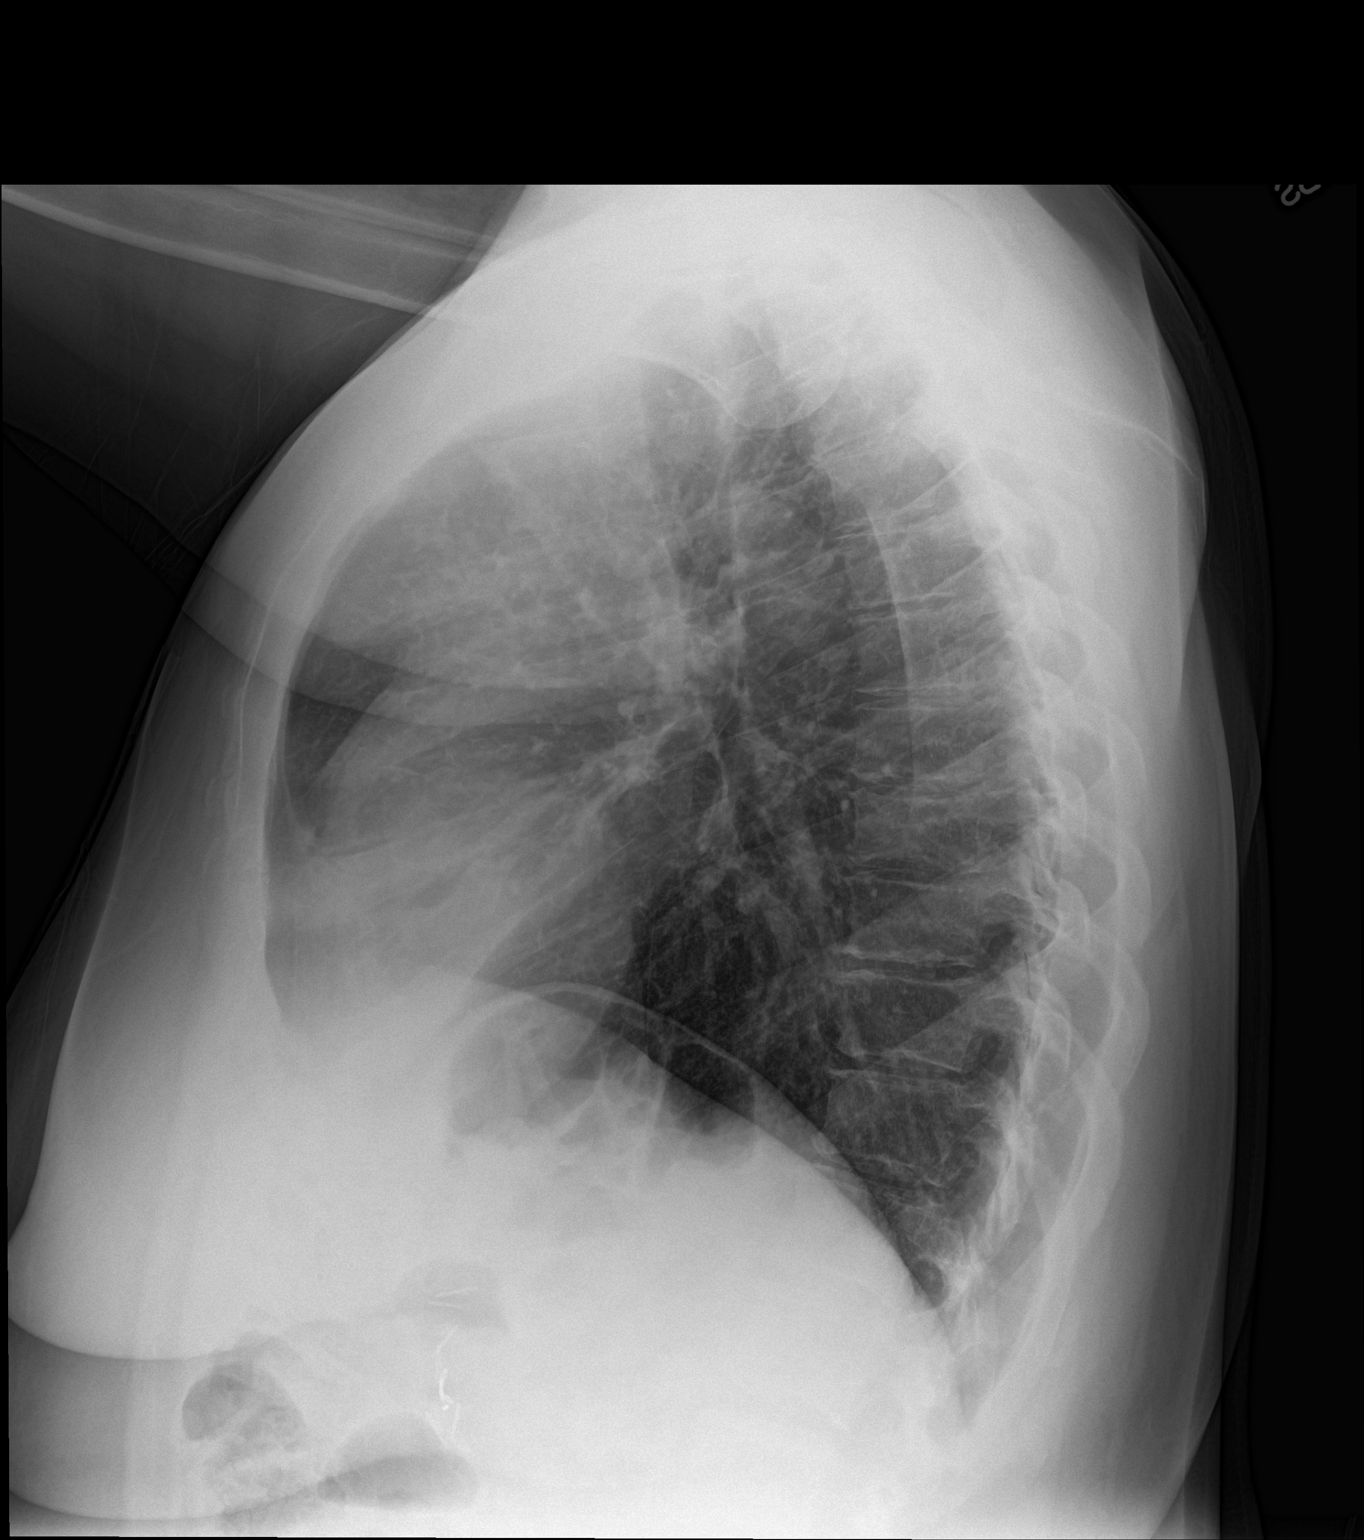

[2 of 2 positions shown; findings below may reference images not displayed]

FINDINGS: There is lingular airspace disease. There is no pleural effusion or
pneumothorax. The heart and mediastinal contours are unremarkable.

The osseous structures are unremarkable.
IMPRESSION: Lingular airspace disease which may reflect atelectasis versus
pneumonia.

## 2017-03-22 IMAGING — CT CT ANGIO CHEST
1 of 2 series · 18 of 30 positions shown · IV contrast (APPLIED)
Comparison: December 13, 2014

CLINICAL DATA: Increased heart rate and left chest pain today.

EXAM:
CT ANGIOGRAPHY CHEST WITH CONTRAST
TECHNIQUE: Multidetector CT imaging of the chest was performed using the
standard protocol during bolus administration of intravenous
contrast. Multiplanar CT image reconstructions and MIPs were
obtained to evaluate the vascular anatomy.
CONTRAST:  100 mL Omnipaque 350

[Series 5: pe 1.0 thins · axial · 0.70mm/px · z∈[-204,+49]mm · 18 of 285 slices shown]
[im 16/285  lung]
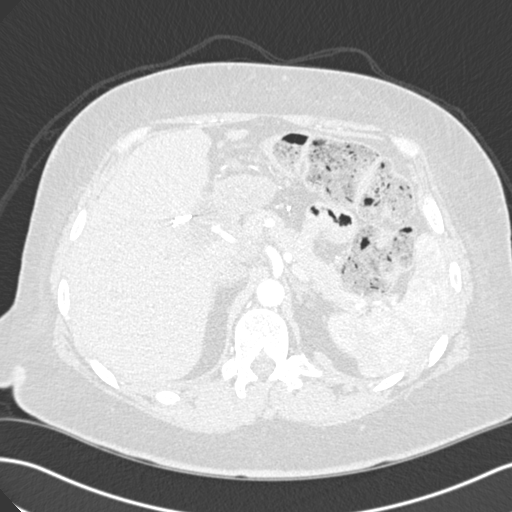
[im 32/285  mediastinal]
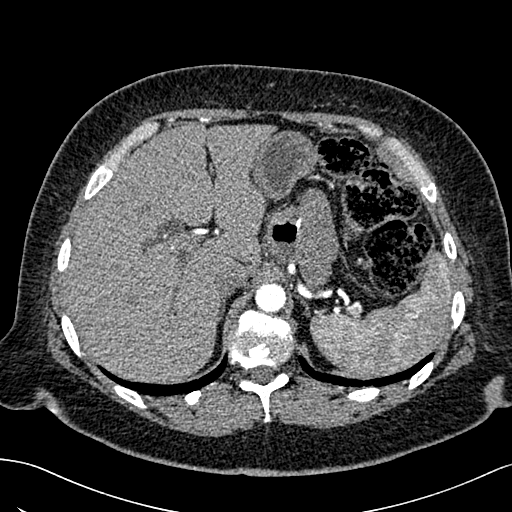
[im 48/285  lung]
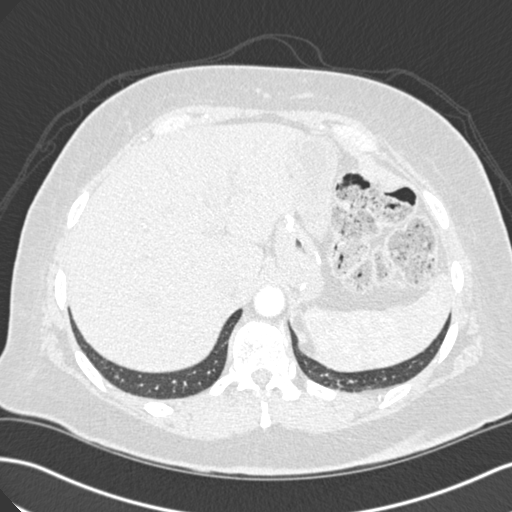
[im 64/285  mediastinal]
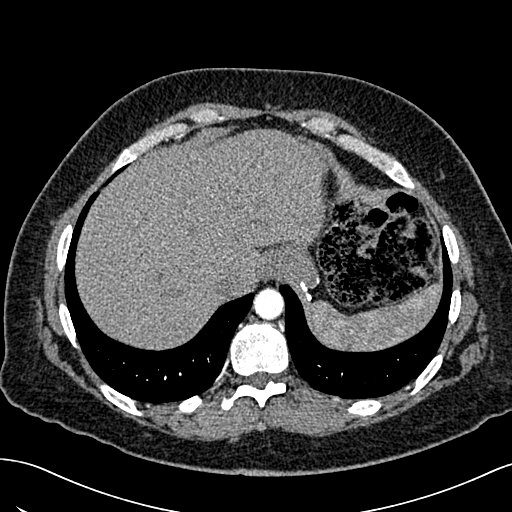
[im 79/285  lung]
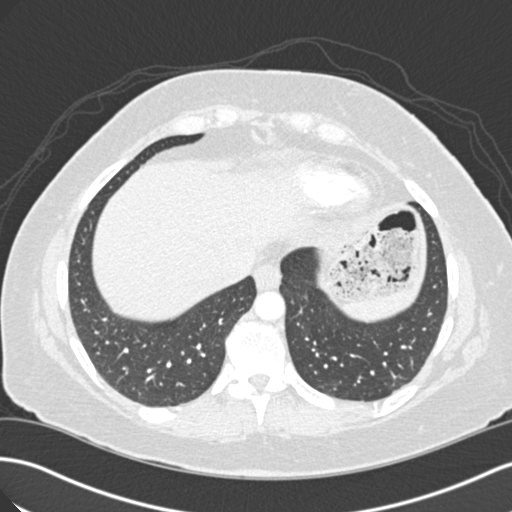
[im 95/285  mediastinal]
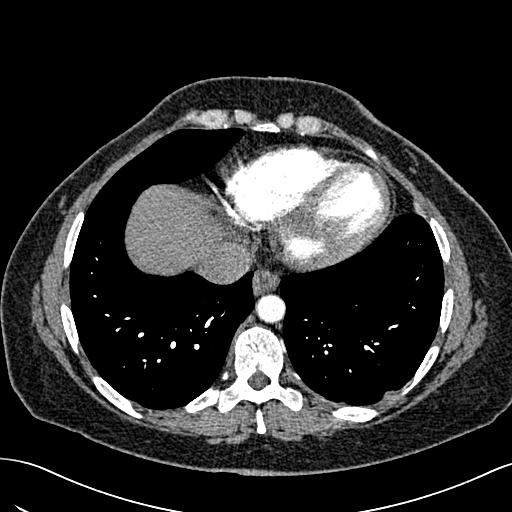
[im 111/285  lung]
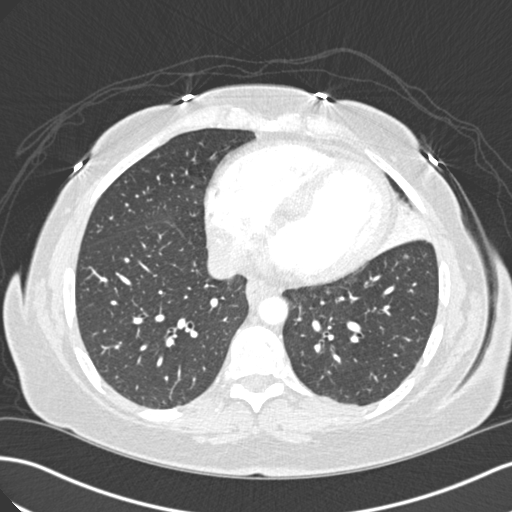
[im 127/285  mediastinal]
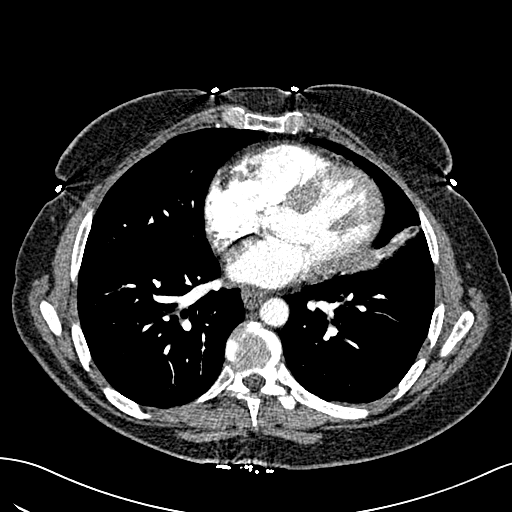
[im 133/285  lung]
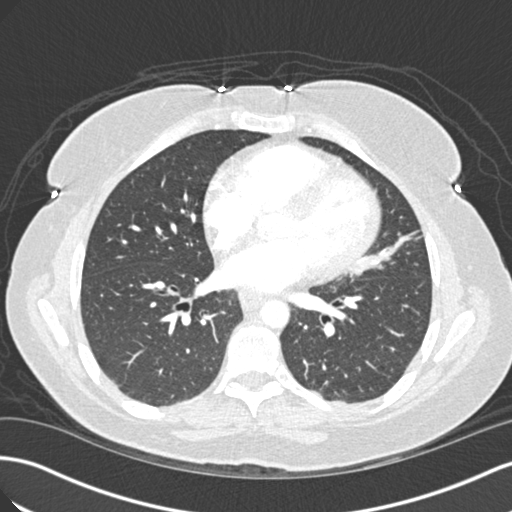
[im 143/285  mediastinal]
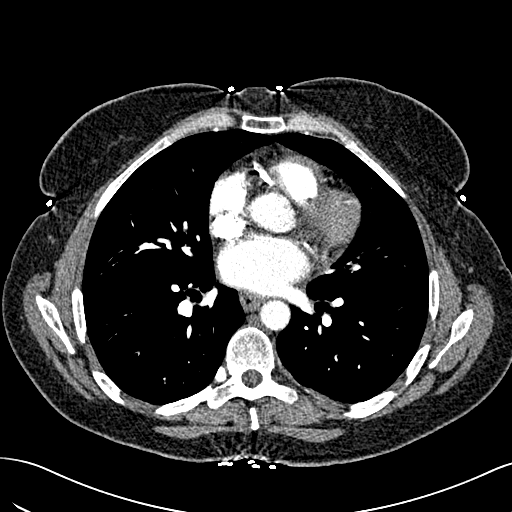
[im 158/285  lung]
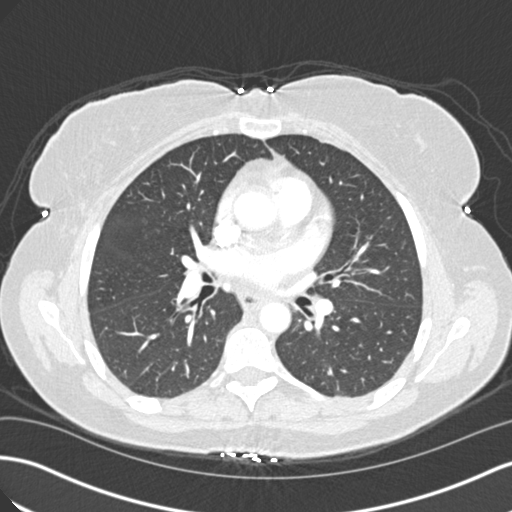
[im 174/285  mediastinal]
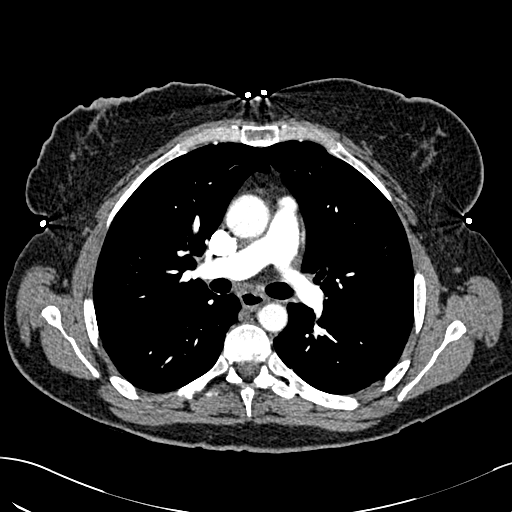
[im 190/285  lung]
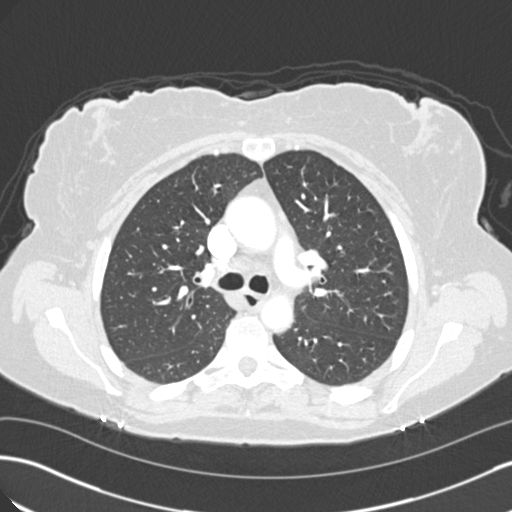
[im 206/285  mediastinal]
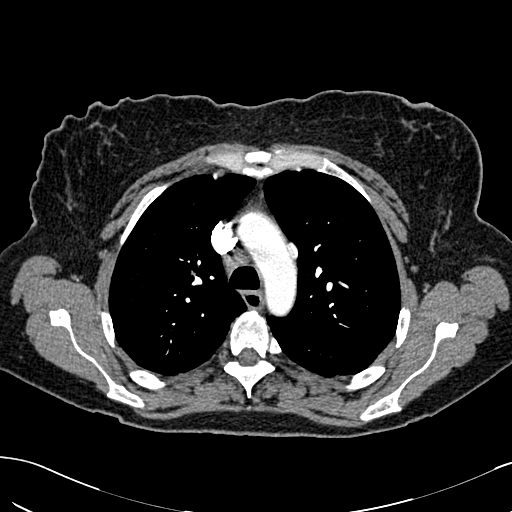
[im 221/285  lung]
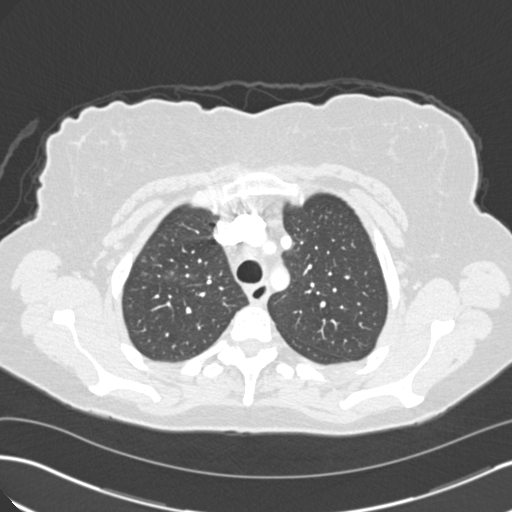
[im 237/285  mediastinal]
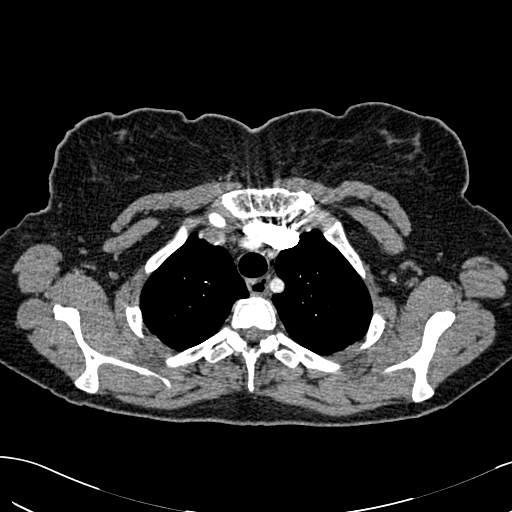
[im 253/285  lung]
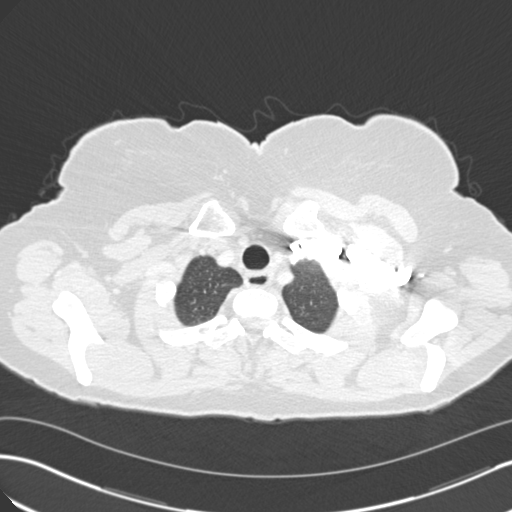
[im 269/285  mediastinal]
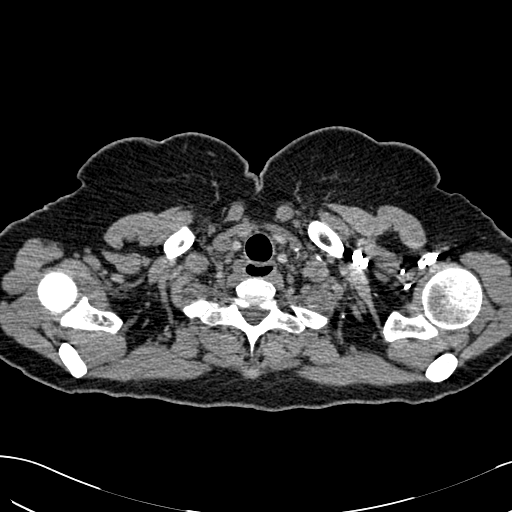

[18 of 30 positions shown; findings below may reference images not displayed]

FINDINGS: There is no pulmonary embolus. The aorta is normal. There is no
mediastinal or hilar lymphadenopathy. The heart size is normal.
There is no pericardial effusion. Images of the lungs demonstrate
mild airspace opacity involving the right upper lobe, left lower
lobe. There is consolidation of the lingula. There is no pleural
effusion. The visualized upper abdominal structures demonstrate
prior cholecystectomy. Degenerative joint changes of the spine are
noted.

Review of the MIP images confirms the above findings.
IMPRESSION: Developing pneumonia in the right upper lobe, left lower lobe. There
is dense consolidation of the lingula impart due to atelectasis but
superimposed pneumonia is not excluded.

## 2017-03-29 ENCOUNTER — Encounter: Payer: Self-pay | Admitting: Physician Assistant

## 2017-03-29 NOTE — Progress Notes (Signed)
Cardiology Office Note Date:  03/31/2017  Patient ID:  Cheyenne Gray, Cheyenne Gray [birth year], MRN [id number] PCP:  Emogene Morgan, MD  Cardiologist:  Dr. Mariah Milling, MD  refresh   Chief Complaint:   History of Present Illness: Cheyenne Gray is a 52 y.o. female with history of nonobstructive CAD by [facility] 03/2016 as detailed below, obesity s/p gastric bypass in 2003, prolonged QT with family history of prolonged QT, on methadone for DJD and chronic back pain following traumatic injury, syncope, migraine disorder, depression, asthma, and strong family history of CAD who presents for .  Prior CT chest in 03/2013 showed showed coronary and aortic calcifications with mild to moderate coronary calcifications seen in the mid to distal RCA. LAD and LCx were not visible. There was minimal plaque in the descending aorta with mild to moderate plaque in the proximal iliac arteries bilaterally. Echo in 07/2012 showed EF of 65-70%, normal wall motion, left atrium was mildly dilated, and PASP normal. 48-hour Holter 06/2012 for tachy-palpitations showed sinus bradycardia, occasional PVCs (370 total), 8 beats of NSVT. Repeat 30-day event monitor in 02/2014 for continued palpitations showed sinus rhythm with rare PVCs. Echo 12/2014 showed EF 65-70%, normal wall motion, normal LV diastolic function parameters. There was moderate septal LVH without LVOT gradient or SAM. Seen in the ED for chest pain, 02/2016, ruled out. Underwent outpatient Myoview 03/27/2016 that showed a small to moderate sized region of predominantly fixed infarct in the mid to distal anteroseptal apical wall with mild peri-infarct ischemia, EF 51%. Read as low risk. She continued to note chest pain in follow up, thus she underwent outpatient diagnostic LHC in 03/2016 for chest pain that showed proximal to mid LAD 40% stenosed, mid LAD 30% stenosed, mid RCA 20% stenosed, distal RCA 20% stenosed, EF 55-65%, LVEDP normal. She was under increased family stress at that  time. She was most recently seen by Dr. Mariah Milling in 09/2016 and was doing well at that time with stable angina. No medication changes were advised. She was advised to modify lifestyle factors as she continued to eat poorly. She called in October, 2018 with bilateral ankle edema. She called again in 02/2017 with chest pain with associated hot flashes. Pain was worse with deep breathing. Given her recent nonobstructive LHC in 03/2016, she was advised to continue Imdur (had missed several doses leading up to her symptoms) and avoid stress.      Past Medical History:  Diagnosis Date  . Arthritis   . Asthma   . B12 deficiency   . Chronic back pain   . Chronic headaches   . Chronic pain    a. on methadone  . Coronary artery disease, non-occlusive    a/ LHC 1/18: proximal to mid LAD 40% stenosed, mid LAD 30% stenosed, mid RCA 20% stenosed, distal RCA 20% stenosed, EF 55-65%, LVEDP normal  . Depression   . DJD (degenerative joint disease), multiple sites   . Hand, foot and mouth disease 2016  . History of echocardiogram    a. echo 2014: EF 65-70%, nl WM, mildly dilated LA, PASP nl  . History of shingles   . Hypertension   . Iron deficiency anemia   . Long QT interval   . Methadone use (HCC)    managed by Dr. Metta Clines  . Migraine   . Obesity   . Palpitations    a. 24 hour Holter: NSR, sinus brady down to 48, occasional PVCs & couplets, 8 beats NSVT; b. 30 day event  monitor 2015: NSR with rare PVC  . Psoriasis   . Syncope and collapse   . Vitamin D deficiency     Past Surgical History:  Procedure Laterality Date  . BARIATRIC SURGERY  2001  . CARDIAC CATHETERIZATION Left 04/16/2016   Procedure: Left Heart Cath and Coronary Angiography;  Surgeon: Antonieta Ibaimothy J Gollan, MD;  Location: [facility] INVASIVE CV LAB;  Service: Cardiovascular;  Laterality: Left;  . CHOLECYSTECTOMY  2001  . GALLBLADDER SURGERY    . GASTRIC BYPASS    . GASTROPLASTY      No outpatient medications have been marked as taking for  the 03/31/17 encounter (Office Visit) with Sondra Bargesunn, Kikue Gerhart M, PA-C.   Current Facility-Administered Medications for the 03/31/17 encounter (Office Visit) with Sondra Bargesunn, Kaleigha Chamberlin M, PA-C  Medication  . bupivacaine (PF) (MARCAINE) 0.25 % injection 30 mL  . bupivacaine (PF) (MARCAINE) 0.25 % injection 30 mL  . fentaNYL (SUBLIMAZE) injection 100 mcg  . fentaNYL (SUBLIMAZE) injection 100 mcg  . lactated ringers infusion 1,000 mL  . lactated ringers infusion 1,000 mL  . lactated ringers infusion 1,000 mL  . lidocaine (PF) (XYLOCAINE) 1 % injection 10 mL  . midazolam (VERSED) 5 MG/5ML injection 5 mg  . orphenadrine (NORFLEX) injection 60 mg  . orphenadrine (NORFLEX) injection 60 mg  . triamcinolone acetonide (KENALOG-40) injection 40 mg  . triamcinolone acetonide (KENALOG-40) injection 40 mg    Allergies:   Penicillins   Social History:  The patient  reports that  has never smoked. she has never used smokeless tobacco. She reports that she does not drink alcohol or use drugs.   Family History:  The patient's family history includes Cancer in her maternal grandmother, maternal uncle, and mother; Heart attack in her brother, maternal grandmother, and mother; Heart attack (age of onset: 6233) in her father; Heart disease in her brother.  ROS:   ROS   PHYSICAL EXAM:  VS:  There were no vitals taken for this visit. BMI: There is no height or weight on file to calculate BMI.  Physical Exam   EKG:  Was ordered and interpreted by me today. Shows   Recent Labs: 06/11/2016: BUN 9; Creatinine, Ser 0.93; Hemoglobin 13.9; Platelets 278; Potassium 4.1; Sodium 138 10/02/2016: ALT 27  10/02/2016: Chol/HDL Ratio 2.1; Cholesterol, Total 114; HDL 55; LDL Calculated 47; Triglycerides 59   CrCl cannot be calculated (Patient's most recent lab result is older than the maximum 21 days allowed.).   Wt Readings from Last 3 Encounters:  10/02/16 234 lb (106.1 kg)  06/11/16 220 lb (99.8 kg)  04/16/16 240 lb (108.9 kg)      Other studies reviewed: Additional studies/records reviewed today include: summarized above  ASSESSMENT AND PLAN:  1.   Disposition: F/u with  in   Current medicines are reviewed at length with the patient today.  The patient did not have any concerns regarding medicines.  Signed, Eula Listenyan Jamichael Knotts, PA-C 03/31/2017 2:37 PM     [facility] [address] [place], [place] [address] [phone] This encounter was created in error - please disregard.

## 2017-03-31 ENCOUNTER — Encounter: Payer: Medicare Other | Admitting: Physician Assistant
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
## 2017-04-03 ENCOUNTER — Encounter: Payer: Self-pay | Admitting: Cardiovascular Disease

## 2017-04-03 ENCOUNTER — Ambulatory Visit (INDEPENDENT_AMBULATORY_CARE_PROVIDER_SITE_OTHER): Payer: Medicare Other | Admitting: Cardiovascular Disease

## 2017-04-03 VITALS — BP 120/64 | HR 65 | Ht 65.5 in | Wt 232.5 lb

## 2017-04-03 DIAGNOSIS — I739 Peripheral vascular disease, unspecified: Secondary | ICD-10-CM

## 2017-04-03 DIAGNOSIS — I25118 Atherosclerotic heart disease of native coronary artery with other forms of angina pectoris: Secondary | ICD-10-CM

## 2017-04-03 DIAGNOSIS — E782 Mixed hyperlipidemia: Secondary | ICD-10-CM | POA: Diagnosis not present

## 2017-04-03 DIAGNOSIS — I209 Angina pectoris, unspecified: Secondary | ICD-10-CM | POA: Diagnosis not present

## 2017-04-03 MED ORDER — ISOSORBIDE MONONITRATE ER 30 MG PO TB24: 30.0000 mg | ORAL_TABLET | Freq: Every day | ORAL | 4 refills | Status: DC

## 2017-04-03 MED ORDER — ROSUVASTATIN CALCIUM 10 MG PO TABS: 10.0000 mg | ORAL_TABLET | Freq: Every day | ORAL | 4 refills | Status: DC

## 2017-04-03 NOTE — Patient Instructions (Addendum)

## 2017-04-21 ENCOUNTER — Inpatient Hospital Stay: Payer: Medicare Other

## 2017-04-21 ENCOUNTER — Inpatient Hospital Stay: Payer: Medicare Other | Attending: Oncology | Admitting: Oncology

## 2017-04-21 ENCOUNTER — Encounter: Payer: Self-pay | Admitting: Oncology

## 2017-04-21 ENCOUNTER — Other Ambulatory Visit: Payer: Self-pay

## 2017-04-21 VITALS — BP 116/81 | HR 74 | Temp 97.1°F | Wt 231.1 lb

## 2017-04-21 DIAGNOSIS — R5383 Other fatigue: Secondary | ICD-10-CM | POA: Diagnosis not present

## 2017-04-21 DIAGNOSIS — R82998 Other abnormal findings in urine: Secondary | ICD-10-CM

## 2017-04-21 DIAGNOSIS — D529 Folate deficiency anemia, unspecified: Secondary | ICD-10-CM | POA: Insufficient documentation

## 2017-04-21 DIAGNOSIS — D649 Anemia, unspecified: Secondary | ICD-10-CM | POA: Diagnosis not present

## 2017-04-21 DIAGNOSIS — D508 Other iron deficiency anemias: Secondary | ICD-10-CM | POA: Diagnosis present

## 2017-04-21 DIAGNOSIS — Z9884 Bariatric surgery status: Secondary | ICD-10-CM | POA: Diagnosis not present

## 2017-04-21 DIAGNOSIS — R8290 Unspecified abnormal findings in urine: Secondary | ICD-10-CM

## 2017-04-21 DIAGNOSIS — E876 Hypokalemia: Secondary | ICD-10-CM | POA: Insufficient documentation

## 2017-04-21 DIAGNOSIS — Z809 Family history of malignant neoplasm, unspecified: Secondary | ICD-10-CM

## 2017-04-21 LAB — CBC WITH DIFFERENTIAL/PLATELET
BASOS ABS: 0 10*3/uL (ref 0–0.1)
BASOS PCT: 1 %
EOS ABS: 0.2 10*3/uL (ref 0–0.7)
Eosinophils Relative: 4 %
HEMATOCRIT: 36.8 % (ref 35.0–47.0)
Hemoglobin: 12.4 g/dL (ref 12.0–16.0)
Lymphocytes Relative: 36 %
Lymphs Abs: 2.2 10*3/uL (ref 1.0–3.6)
MCH: 29.9 pg (ref 26.0–34.0)
MCHC: 33.6 g/dL (ref 32.0–36.0)
MCV: 89.1 fL (ref 80.0–100.0)
MONO ABS: 0.5 10*3/uL (ref 0.2–0.9)
Monocytes Relative: 7 %
NEUTROS ABS: 3.3 10*3/uL (ref 1.4–6.5)
NEUTROS PCT: 52 %
Platelets: 188 10*3/uL (ref 150–440)
RBC: 4.13 MIL/uL (ref 3.80–5.20)
RDW: 13.3 % (ref 11.5–14.5)
WBC: 6.3 10*3/uL (ref 3.6–11.0)

## 2017-04-21 LAB — URINALYSIS, COMPLETE (UACMP) WITH MICROSCOPIC
BILIRUBIN URINE: NEGATIVE
Bacteria, UA: NONE SEEN
Glucose, UA: NEGATIVE mg/dL
Ketones, ur: NEGATIVE mg/dL
Nitrite: NEGATIVE
Protein, ur: NEGATIVE mg/dL
SPECIFIC GRAVITY, URINE: 1.009 (ref 1.005–1.030)
pH: 6 (ref 5.0–8.0)

## 2017-04-21 LAB — COMPREHENSIVE METABOLIC PANEL
ALBUMIN: 3.4 g/dL — AB (ref 3.5–5.0)
ALT: 25 U/L (ref 14–54)
ANION GAP: 7 (ref 5–15)
AST: 46 U/L — AB (ref 15–41)
Alkaline Phosphatase: 109 U/L (ref 38–126)
BUN: 5 mg/dL — AB (ref 6–20)
CHLORIDE: 102 mmol/L (ref 101–111)
CO2: 30 mmol/L (ref 22–32)
Calcium: 8.7 mg/dL — ABNORMAL LOW (ref 8.9–10.3)
Creatinine, Ser: 0.94 mg/dL (ref 0.44–1.00)
GFR calc Af Amer: >60 mL/min (ref >60)
GFR calc non Af Amer: >60 mL/min (ref >60)
GLUCOSE: 106 mg/dL — AB (ref 65–99)
Potassium: 3 mmol/L — ABNORMAL LOW (ref 3.5–5.1)
SODIUM: 139 mmol/L (ref 135–145)
TOTAL PROTEIN: 6.8 g/dL (ref 6.5–8.1)
Total Bilirubin: 0.4 mg/dL (ref 0.3–1.2)

## 2017-04-21 LAB — FERRITIN: FERRITIN: 28 ng/mL (ref 11–307)

## 2017-04-21 LAB — IRON AND TIBC
Iron: 51 ug/dL (ref 28–170)
Saturation Ratios: 16 % (ref 10.4–31.8)
TIBC: 316 ug/dL (ref 250–450)
UIBC: 265 ug/dL

## 2017-04-21 LAB — MAGNESIUM: MAGNESIUM: 1.7 mg/dL (ref 1.7–2.4)

## 2017-04-21 LAB — FOLATE: Folate: 5.7 ng/mL — ABNORMAL LOW (ref >5.9)

## 2017-04-21 LAB — VITAMIN B12: VITAMIN B 12: 508 pg/mL (ref 180–914)

## 2017-04-21 MED ORDER — POTASSIUM CHLORIDE CRYS ER 20 MEQ PO TBCR: 20.0000 meq | EXTENDED_RELEASE_TABLET | Freq: Every day | ORAL | 0 refills | Status: DC

## 2017-04-21 NOTE — Progress Notes (Signed)
Hematology/Oncology  Follow up note [facility] [phone] [phone]   Patient Care Team: Emogene Morgan, MD as PCP - General (Family Medicine) Emogene Morgan, MD as Referring Physician (Family Medicine) Antonieta Iba, MD as Consulting Physician (Cardiology)  REASON FOR VISIT Follow up for treatment of anemia. HISTORY OF PRESENTING ILLNESS:  This is a patient who used to follow up with Dr. Sherrlyn Hock presents for follow-up of management of her anemia.  Patient was last seen by Dr. Sherrlyn Hock in July 2016.  She was again being seen by nurse practitioner on August 21, 2015 for follow-up of her iron deficiency anemia.  She has a history of iron deficiency anemia remote gastric bypass history in 2001.  She used to IV iron infusion with venofer 100mg  Every 3 months but she has a follow-up with [place] for about a year.  Patient reports feeling fatigued.  She did not have much of appetite.  Denies any nausea vomiting diarrhea or abdominal pain.  She has no unintentional weight loss, instead she feels that she has gained a few pounds.  Review of Systems  Constitutional: Positive for malaise/fatigue. Negative for chills, fever and weight loss.  HENT: Negative for hearing loss and nosebleeds.   Eyes: Negative for photophobia and pain.  Respiratory: Negative for cough and sputum production.   Cardiovascular: Negative for chest pain and palpitations.  Gastrointestinal: Negative for heartburn.  Genitourinary: Negative for dysuria.  Musculoskeletal: Negative for myalgias.  Skin: Negative for rash.  Neurological: Negative for dizziness.  Endo/Heme/Allergies: Does not bruise/bleed easily.  Psychiatric/Behavioral: Negative for depression.    MEDICAL HISTORY:  Past Medical History:  Diagnosis Date  . Arthritis   . Asthma   . B12 deficiency   . Chronic back pain   . Chronic headaches   . Chronic pain    a. on methadone  . Coronary artery disease, non-occlusive      a/ LHC 1/18: proximal to mid LAD 40% stenosed, mid LAD 30% stenosed, mid RCA 20% stenosed, distal RCA 20% stenosed, EF 55-65%, LVEDP normal  . Depression   . DJD (degenerative joint disease), multiple sites   . Hand, foot and mouth disease 2016  . History of echocardiogram    a. echo 2014: EF 65-70%, nl WM, mildly dilated LA, PASP nl  . History of shingles   . Hypertension   . Iron deficiency anemia   . Long QT interval   . Methadone use (HCC)    managed by Dr. Metta Clines  . Migraine   . Obesity   . Palpitations    a. 24 hour Holter: NSR, sinus brady down to 48, occasional PVCs & couplets, 8 beats NSVT; b. 30 day event monitor 2015: NSR with rare PVC  . Psoriasis   . Syncope and collapse   . Vitamin D deficiency     SURGICAL HISTORY: Past Surgical History:  Procedure Laterality Date  . BARIATRIC SURGERY  2001  . CARDIAC CATHETERIZATION Left 04/16/2016   Procedure: Left Heart Cath and Coronary Angiography;  Surgeon: Antonieta Iba, MD;  Location: [facility] INVASIVE CV LAB;  Service: Cardiovascular;  Laterality: Left;  . CHOLECYSTECTOMY  2001  . GALLBLADDER SURGERY    . GASTRIC BYPASS    . GASTROPLASTY      SOCIAL HISTORY: Social History   Socioeconomic History  . Marital status: Widowed    Spouse name: Not on file  . Number of children: Not on file  . Years of education: Not on  file  . Highest education level: Not on file  Social Needs  . Financial resource strain: Not on file  . Food insecurity - worry: Not on file  . Food insecurity - inability: Not on file  . Transportation needs - medical: Not on file  . Transportation needs - non-medical: Not on file  Occupational History  . Not on file  Tobacco Use  . Smoking status: Never Smoker  . Smokeless tobacco: Never Used  Substance and Sexual Activity  . Alcohol use: No    Alcohol/week: 0.0 oz  . Drug use: No  . Sexual activity: Not on file  Other Topics Concern  . Not on file  Social History Narrative   ** Merged  History Encounter **        FAMILY HISTORY: Family History  Problem Relation Age of Onset  . Heart attack Mother   . Cancer Mother   . Heart attack Father 533       MI  . Heart attack Brother   . Heart disease Brother   . Heart attack Maternal Grandmother   . Cancer Maternal Grandmother   . Cancer Maternal Uncle     ALLERGIES:  is allergic to penicillins.  MEDICATIONS:  Current Outpatient Medications  Medication Sig Dispense Refill  . albuterol (PROVENTIL HFA;VENTOLIN HFA) 108 (90 BASE) MCG/ACT inhaler Inhale 2 puffs into the lungs as needed for wheezing.    . Black Cohosh 175 MG CAPS Take 1 capsule by mouth daily.    . busPIRone (BUSPAR) 7.5 MG tablet Take 7.5 mg by mouth 2 (two) times daily.    Marland Kitchen. gabapentin (NEURONTIN) 400 MG capsule Limit 2 tablets in the a.m. and midday and 3 tablets each evening    Please dispense a three-month supply 630 capsule 0  . isosorbide mononitrate (IMDUR) 30 MG 24 hr tablet Take 1 tablet (30 mg total) by mouth daily. 90 tablet 4  . methadone (DOLOPHINE) 10 MG tablet Limit 1-2 tablets by mouth 2-3 times per day if tolerated 180 tablet 0  . nitroGLYCERIN (NITROSTAT) 0.4 MG SL tablet Place 1 tablet (0.4 mg total) under the tongue every 5 (five) minutes as needed for chest pain. 25 tablet 3  . PARoxetine (PAXIL) 20 MG tablet Take 20 mg by mouth daily.     Marland Kitchen. rOPINIRole (REQUIP) 0.25 MG tablet Take 0.5-0.75 mg by mouth at bedtime.     . rosuvastatin (CRESTOR) 10 MG tablet Take 1 tablet (10 mg total) by mouth daily. 90 tablet 4   Current Facility-Administered Medications  Medication Dose Route Frequency Provider Last Rate Last Dose  . bupivacaine (PF) (MARCAINE) 0.25 % injection 30 mL  30 mL Other Once Ewing Schleinrisp, Gregory, MD      . bupivacaine (PF) (MARCAINE) 0.25 % injection 30 mL  30 mL Other Once Ewing Schleinrisp, Gregory, MD      . fentaNYL (SUBLIMAZE) injection 100 mcg  100 mcg Intravenous Once Ewing Schleinrisp, Gregory, MD      . fentaNYL (SUBLIMAZE) injection 100 mcg  100  mcg Intravenous Once Ewing Schleinrisp, Gregory, MD      . lactated ringers infusion 1,000 mL  1,000 mL Intravenous Continuous Ewing Schleinrisp, Gregory, MD      . lactated ringers infusion 1,000 mL  1,000 mL Intravenous Continuous Ewing Schleinrisp, Gregory, MD      . lactated ringers infusion 1,000 mL  1,000 mL Intravenous Continuous Ewing Schleinrisp, Gregory, MD 125 mL/hr at 08/06/15 1002 1,000 mL at 08/06/15 1002  . lidocaine (PF) (XYLOCAINE) 1 % injection  10 mL  10 mL Subcutaneous Once Ewing Schlein, MD      . midazolam (VERSED) 5 MG/5ML injection 5 mg  5 mg Intravenous Once Ewing Schlein, MD      . orphenadrine (NORFLEX) injection 60 mg  60 mg Intramuscular Once Ewing Schlein, MD      . orphenadrine (NORFLEX) injection 60 mg  60 mg Intramuscular Once Ewing Schlein, MD      . triamcinolone acetonide (KENALOG-40) injection 40 mg  40 mg Other Once Ewing Schlein, MD      . triamcinolone acetonide (KENALOG-40) injection 40 mg  40 mg Other Once Ewing Schlein, MD         PHYSICAL EXAMINATION: ECOG PERFORMANCE STATUS: 1 - Symptomatic but completely ambulatory Vitals:   04/21/17 1053  BP: 116/81  Pulse: 74  Temp: (!) 97.1 F (36.2 C)   Filed Weights   04/21/17 1053  Weight: 231 lb 1.6 oz (104.8 kg)    Physical Exam  Constitutional: She is oriented to person, place, and time. No distress.  HENT:  Head: Normocephalic.  Mouth/Throat: Oropharynx is clear and moist.  Eyes: Conjunctivae and EOM are normal. Pupils are equal, round, and reactive to light. No scleral icterus.  Neck: Normal range of motion. Neck supple.  Cardiovascular: Normal rate and regular rhythm.  No murmur heard. Pulmonary/Chest: She has no wheezes.  Abdominal: Soft. Bowel sounds are normal. She exhibits no distension.  Musculoskeletal: Normal range of motion. She exhibits no edema or deformity.  Lymphadenopathy:    She has no cervical adenopathy.  Neurological: She is alert and oriented to person, place, and time. No cranial nerve deficit.  Skin: Skin  is warm and dry.  Psychiatric: Affect normal.     LABORATORY DATA:  I have reviewed the data as listed Lab Results  Component Value Date   WBC 6.3 04/21/2017   HGB 12.4 04/21/2017   HCT 36.8 04/21/2017   MCV 89.1 04/21/2017   PLT 188 04/21/2017   Recent Labs    06/11/16 1954 10/02/16 1037 04/21/17 1118  NA 138  --  139  K 4.1  --  3.0*  CL 103  --  102  CO2 28  --  30  GLUCOSE 82  --  106*  BUN 9  --  5*  CREATININE 0.93  --  0.94  CALCIUM 9.0  --  8.7*  GFRNONAA >60  --  >60  GFRAA >60  --  >60  PROT  --  6.6 6.8  ALBUMIN  --  3.7 3.4*  AST  --  52* 46*  ALT  --  27 25  ALKPHOS  --  152* 109  BILITOT  --  0.3 0.4  BILIDIR  --  0.09  --        ASSESSMENT & PLAN:  1. Anemia, unspecified type   2. Other fatigue   3. Hx of gastric bypass   4. Hypokalemia   5. Leukocytes in urine    Discussed with patient that I will start with basic lab workup today, including CBC, CMP, iron TIBC, ferritin, B12, and folate level, copper level. Stool occult to lab, and UA to rule out hematuria.  # CMP showed hypokalemia, will send potassium supplementation. Check Magnesium level.  # UA showed leukocytes,she is asymptomatic and the specimen is not clean catch. Will hold additional tests at this point.  # Iron panel reviewed, ferritin 28, borderline, given that she has gastric bypass and has limited iron absorption, will give  IV Venofer 200mg  x 1 when she comes for follow up.  All questions were answered. The patient knows to call the clinic with any problems questions or concerns.  Return of visit: 2 weeks to discuss results/ Venofer.    Rickard Patience, MD, PhD Hematology Oncology [facility] at [facility] Pager- [phone] 04/21/2017

## 2017-04-23 LAB — COPPER, SERUM: COPPER: 124 ug/dL (ref 72–166)

## 2017-04-24 ENCOUNTER — Encounter: Payer: Self-pay | Admitting: Oncology

## 2017-04-25 DIAGNOSIS — D508 Other iron deficiency anemias: Secondary | ICD-10-CM | POA: Diagnosis not present

## 2017-04-26 DIAGNOSIS — D508 Other iron deficiency anemias: Secondary | ICD-10-CM | POA: Diagnosis not present

## 2017-04-27 ENCOUNTER — Other Ambulatory Visit: Payer: Self-pay | Admitting: *Deleted

## 2017-04-27 ENCOUNTER — Other Ambulatory Visit: Payer: Self-pay | Admitting: Oncology

## 2017-04-27 DIAGNOSIS — R5383 Other fatigue: Secondary | ICD-10-CM

## 2017-04-27 DIAGNOSIS — E876 Hypokalemia: Secondary | ICD-10-CM

## 2017-04-27 DIAGNOSIS — D508 Other iron deficiency anemias: Secondary | ICD-10-CM | POA: Diagnosis not present

## 2017-04-27 DIAGNOSIS — D649 Anemia, unspecified: Secondary | ICD-10-CM

## 2017-04-27 LAB — OCCULT BLOOD X 1 CARD TO LAB, STOOL
Fecal Occult Bld: NEGATIVE
Fecal Occult Bld: NEGATIVE
Fecal Occult Bld: NEGATIVE

## 2017-04-27 NOTE — Telephone Encounter (Signed)
5 K+ was 3.0 and she was given a 2 week supply of potassium. She has appointment on 2/20, but no labs are ordered

## 2017-04-27 NOTE — Telephone Encounter (Signed)
No refill. I will place lab orders. Thank  you

## 2017-05-01 LAB — STOOL CULTURE: E COLI SHIGA TOXIN ASSAY: NEGATIVE

## 2017-05-01 LAB — STOOL CULTURE REFLEX - RSASHR

## 2017-05-01 LAB — STOOL CULTURE REFLEX - CMPCXR

## 2017-05-05 NOTE — Progress Notes (Signed)
Hematology/Oncology  Follow up note [facility] Telephone:([phone] [phone]   Patient Care Team: Emogene Morgan, MD as PCP - General (Family Medicine) Emogene Morgan, MD as Referring Physician (Family Medicine) Antonieta Iba, MD as Consulting Physician (Cardiology)  REASON FOR VISIT Follow up for treatment of anemia. HISTORY OF PRESENTING ILLNESS:  This is a patient who used to follow up with Dr. Sherrlyn Hock presents for follow-up of management of her anemia.  Patient was last seen by Dr. Sherrlyn Hock in July 2016.  She was again being seen by nurse practitioner on August 21, 2015 for follow-up of her iron deficiency anemia.  She has a history of iron deficiency anemia remote gastric bypass history in 2001.  She used to IV iron infusion with venofer 100mg  Every 3 months but she has a follow-up with [place] for about a year.  Patient reports feeling fatigued.  She did not have much of appetite.  Denies any nausea vomiting diarrhea or abdominal pain.  She has no unintentional weight loss, instead she feels that she has gained a few pounds.  INTERVAL HISTORY Cheyenne Gray is a 52 y.o. female who has above history reviewed by me today presents for follow up visit for management of chronic anemia.  She continues to feel fatigue. No new symptoms.   Review of Systems  Constitutional: Positive for malaise/fatigue. Negative for chills, fever and weight loss.  HENT: Negative for hearing loss and nosebleeds.   Eyes: Negative for photophobia and pain.  Respiratory: Negative for cough and sputum production.   Cardiovascular: Negative for chest pain and palpitations.  Gastrointestinal: Negative for abdominal pain, heartburn, nausea and vomiting.  Genitourinary: Negative for dysuria.  Musculoskeletal: Negative for myalgias.  Skin: Negative for rash.  Neurological: Negative for dizziness.  Endo/Heme/Allergies: Does not bruise/bleed easily.  Psychiatric/Behavioral: Negative  for depression.    MEDICAL HISTORY:  Past Medical History:  Diagnosis Date  . Arthritis   . Asthma   . B12 deficiency   . Chronic back pain   . Chronic headaches   . Chronic pain    a. on methadone  . Coronary artery disease, non-occlusive    a/ LHC 1/18: proximal to mid LAD 40% stenosed, mid LAD 30% stenosed, mid RCA 20% stenosed, distal RCA 20% stenosed, EF 55-65%, LVEDP normal  . Depression   . DJD (degenerative joint disease), multiple sites   . Hand, foot and mouth disease 2016  . History of echocardiogram    a. echo 2014: EF 65-70%, nl WM, mildly dilated LA, PASP nl  . History of shingles   . Hypertension   . Iron deficiency anemia   . Long QT interval   . Methadone use (HCC)    managed by Dr. Metta Clines  . Migraine   . Obesity   . Palpitations    a. 24 hour Holter: NSR, sinus brady down to 48, occasional PVCs & couplets, 8 beats NSVT; b. 30 day event monitor 2015: NSR with rare PVC  . Psoriasis   . Syncope and collapse   . Vitamin D deficiency     SURGICAL HISTORY: Past Surgical History:  Procedure Laterality Date  . BARIATRIC SURGERY  2001  . CARDIAC CATHETERIZATION Left 04/16/2016   Procedure: Left Heart Cath and Coronary Angiography;  Surgeon: Antonieta Iba, MD;  Location: [facility] INVASIVE CV LAB;  Service: Cardiovascular;  Laterality: Left;  . CHOLECYSTECTOMY  2001  . GALLBLADDER SURGERY    . GASTRIC BYPASS    .  GASTROPLASTY      SOCIAL HISTORY: Social History   Socioeconomic History  . Marital status: Widowed    Spouse name: Not on file  . Number of children: Not on file  . Years of education: Not on file  . Highest education level: Not on file  Social Needs  . Financial resource strain: Not on file  . Food insecurity - worry: Not on file  . Food insecurity - inability: Not on file  . Transportation needs - medical: Not on file  . Transportation needs - non-medical: Not on file  Occupational History  . Not on file  Tobacco Use  . Smoking status:  Never Smoker  . Smokeless tobacco: Never Used  Substance and Sexual Activity  . Alcohol use: No    Alcohol/week: 0.0 oz  . Drug use: No  . Sexual activity: Not on file  Other Topics Concern  . Not on file  Social History Narrative   ** Merged History Encounter **        FAMILY HISTORY: Family History  Problem Relation Age of Onset  . Heart attack Mother   . Cancer Mother   . Heart attack Father 24       MI  . Heart attack Brother   . Heart disease Brother   . Heart attack Maternal Grandmother   . Cancer Maternal Grandmother   . Cancer Maternal Uncle     ALLERGIES:  is allergic to penicillins.  MEDICATIONS:  Current Outpatient Medications  Medication Sig Dispense Refill  . albuterol (PROVENTIL HFA;VENTOLIN HFA) 108 (90 BASE) MCG/ACT inhaler Inhale 2 puffs into the lungs as needed for wheezing.    . Black Cohosh 175 MG CAPS Take 1 capsule by mouth daily.    . busPIRone (BUSPAR) 7.5 MG tablet Take 7.5 mg by mouth 2 (two) times daily.    Marland Kitchen gabapentin (NEURONTIN) 400 MG capsule Limit 2 tablets in the a.m. and midday and 3 tablets each evening    Please dispense a three-month supply 630 capsule 0  . isosorbide mononitrate (IMDUR) 30 MG 24 hr tablet Take 1 tablet (30 mg total) by mouth daily. 90 tablet 4  . methadone (DOLOPHINE) 10 MG tablet Limit 1-2 tablets by mouth 2-3 times per day if tolerated 180 tablet 0  . nitroGLYCERIN (NITROSTAT) 0.4 MG SL tablet Place 1 tablet (0.4 mg total) under the tongue every 5 (five) minutes as needed for chest pain. 25 tablet 3  . PARoxetine (PAXIL) 20 MG tablet Take 20 mg by mouth daily.     . potassium chloride SA (K-DUR,KLOR-CON) 20 MEQ tablet Take 1 tablet (20 mEq total) by mouth daily. 14 tablet 0  . rOPINIRole (REQUIP) 0.25 MG tablet Take 0.5-0.75 mg by mouth at bedtime.     . rosuvastatin (CRESTOR) 10 MG tablet Take 1 tablet (10 mg total) by mouth daily. 90 tablet 4   Current Facility-Administered Medications  Medication Dose Route  Frequency Provider Last Rate Last Dose  . bupivacaine (PF) (MARCAINE) 0.25 % injection 30 mL  30 mL Other Once Ewing Schlein, MD      . bupivacaine (PF) (MARCAINE) 0.25 % injection 30 mL  30 mL Other Once Ewing Schlein, MD      . fentaNYL (SUBLIMAZE) injection 100 mcg  100 mcg Intravenous Once Ewing Schlein, MD      . fentaNYL (SUBLIMAZE) injection 100 mcg  100 mcg Intravenous Once Ewing Schlein, MD      . lactated ringers infusion 1,000 mL  1,000 mL Intravenous Continuous Ewing Schlein, MD      . lactated ringers infusion 1,000 mL  1,000 mL Intravenous Continuous Ewing Schlein, MD      . lactated ringers infusion 1,000 mL  1,000 mL Intravenous Continuous Ewing Schlein, MD 125 mL/hr at 08/06/15 1002 1,000 mL at 08/06/15 1002  . lidocaine (PF) (XYLOCAINE) 1 % injection 10 mL  10 mL Subcutaneous Once Ewing Schlein, MD      . midazolam (VERSED) 5 MG/5ML injection 5 mg  5 mg Intravenous Once Ewing Schlein, MD      . orphenadrine (NORFLEX) injection 60 mg  60 mg Intramuscular Once Ewing Schlein, MD      . orphenadrine (NORFLEX) injection 60 mg  60 mg Intramuscular Once Ewing Schlein, MD      . triamcinolone acetonide (KENALOG-40) injection 40 mg  40 mg Other Once Ewing Schlein, MD      . triamcinolone acetonide (KENALOG-40) injection 40 mg  40 mg Other Once Ewing Schlein, MD         PHYSICAL EXAMINATION: ECOG PERFORMANCE STATUS: 1 - Symptomatic but completely ambulatory Vitals:   05/06/17 1002 05/06/17 1010  BP:  122/81  Pulse:  61  Resp: 12   Temp:  97.8 F (36.6 C)   Filed Weights   05/06/17 1002  Weight: 232 lb 4.8 oz (105.4 kg)    Physical Exam  Constitutional: She is oriented to person, place, and time. No distress.  Obese  HENT:  Head: Normocephalic.  Mouth/Throat: No oropharyngeal exudate.  Eyes: Conjunctivae and EOM are normal. Pupils are equal, round, and reactive to light. No scleral icterus.  Neck: Normal range of motion. Neck supple.  Cardiovascular: Normal  rate and regular rhythm.  No murmur heard. Pulmonary/Chest: Effort normal and breath sounds normal. No respiratory distress.  Abdominal: Soft. Bowel sounds are normal. She exhibits no distension.  Musculoskeletal: Normal range of motion. She exhibits no edema or deformity.  Lymphadenopathy:    She has no cervical adenopathy.  Neurological: She is alert and oriented to person, place, and time. No cranial nerve deficit.  Skin: Skin is warm and dry. No erythema.  Psychiatric: Affect and judgment normal.     LABORATORY DATA:  I have reviewed the data as listed Lab Results  Component Value Date   WBC 6.3 04/21/2017   HGB 12.4 04/21/2017   HCT 36.8 04/21/2017   MCV 89.1 04/21/2017   PLT 188 04/21/2017   Recent Labs    06/11/16 1954 10/02/16 1037 04/21/17 1118  NA 138  --  139  K 4.1  --  3.0*  CL 103  --  102  CO2 28  --  30  GLUCOSE 82  --  106*  BUN 9  --  5*  CREATININE 0.93  --  0.94  CALCIUM 9.0  --  8.7*  GFRNONAA >60  --  >60  GFRAA >60  --  >60  PROT  --  6.6 6.8  ALBUMIN  --  3.7 3.4*  AST  --  52* 46*  ALT  --  27 25  ALKPHOS  --  152* 109  BILITOT  --  0.3 0.4  BILIDIR  --  0.09  --        ASSESSMENT & PLAN:  1. Other iron deficiency anemia   2. Anemia due to folic acid deficiency, unspecified deficiency type   3. Gastric bypass status for obesity    # Iron panel reviewed, ferritin 28, borderline, given that  she has gastric bypass and has limited iron absorption, will give IV Venofer 200mg  x 1  Today, and repeat every 3 months if indicated.   # Folic acid deficiency: start folic acid 1mg  daily.  # Hypokalemia: repeat potassium today showed K is 4.2. No need for additional potassium supplementation.   All questions were answered. The patient knows to call the clinic with any problems questions or concerns.  Return of visit: 3 months repeat labs prior to visit, and possible Venofer.    Cheyenne PatienceZhou Azalee Weimer, MD, PhD Hematology Oncology [facility]  at [facility] Pager- [phone] 05/06/2017

## 2017-05-06 ENCOUNTER — Encounter: Payer: Self-pay | Admitting: Oncology

## 2017-05-06 ENCOUNTER — Inpatient Hospital Stay: Payer: Medicare Other

## 2017-05-06 ENCOUNTER — Inpatient Hospital Stay (HOSPITAL_BASED_OUTPATIENT_CLINIC_OR_DEPARTMENT_OTHER): Payer: Medicare Other | Admitting: Oncology

## 2017-05-06 ENCOUNTER — Other Ambulatory Visit: Payer: Self-pay

## 2017-05-06 VITALS — BP 115/76 | HR 58 | Temp 96.6°F | Resp 18

## 2017-05-06 VITALS — BP 122/81 | HR 61 | Temp 97.8°F | Resp 12 | Ht 65.0 in | Wt 232.3 lb

## 2017-05-06 DIAGNOSIS — D508 Other iron deficiency anemias: Secondary | ICD-10-CM

## 2017-05-06 DIAGNOSIS — Z9884 Bariatric surgery status: Secondary | ICD-10-CM

## 2017-05-06 DIAGNOSIS — E876 Hypokalemia: Secondary | ICD-10-CM

## 2017-05-06 DIAGNOSIS — D529 Folate deficiency anemia, unspecified: Secondary | ICD-10-CM | POA: Diagnosis not present

## 2017-05-06 LAB — POTASSIUM: Potassium: 4.2 mmol/L (ref 3.5–5.1)

## 2017-05-06 MED ORDER — IRON SUCROSE 20 MG/ML IV SOLN
200.0000 mg | Freq: Once | INTRAVENOUS | Status: AC
  Administered 2017-05-06: 200 mg via INTRAVENOUS
  Filled 2017-05-06: qty 10

## 2017-05-06 MED ORDER — FOLIC ACID 1 MG PO TABS: 1.0000 mg | ORAL_TABLET | Freq: Every day | ORAL | 3 refills | Status: DC

## 2017-05-06 MED ORDER — SODIUM CHLORIDE 0.9% FLUSH
3.0000 mL | Freq: Once | INTRAVENOUS | Status: DC | PRN
  Filled 2017-05-06: qty 3

## 2017-05-06 MED ORDER — SODIUM CHLORIDE 0.9 % IV SOLN
Freq: Once | INTRAVENOUS | Status: AC
  Administered 2017-05-06: 11:00:00 via INTRAVENOUS
  Filled 2017-05-06: qty 1000

## 2017-05-06 NOTE — Progress Notes (Signed)
Patient here for follows up. She states the leg cramps are better since starting K+. She also reports constant diarrhea for the past two weeks.  She reports being cold constantly.

## 2017-05-12 ENCOUNTER — Encounter: Payer: Self-pay | Admitting: Oncology

## 2017-06-04 ENCOUNTER — Other Ambulatory Visit: Payer: Medicare Other

## 2017-07-01 ENCOUNTER — Inpatient Hospital Stay: Admission: RE | Admit: 2017-07-01 | Payer: Medicare Other | Source: Ambulatory Visit

## 2017-07-21 ENCOUNTER — Telehealth: Payer: Self-pay | Admitting: Cardiovascular Disease

## 2017-07-21 NOTE — Telephone Encounter (Signed)
S/w patient. She's been having swelling in her legs now for about 2 weeks. States she remembers Dr Mariah Milling saying to call to be seen if she ever started having swelling. Denies shortness of breath or chest pain. Offered patient appointment with Ward Givens, NP 07/22/17. She said that would be fine. Patient scheduled.

## 2017-07-21 NOTE — Telephone Encounter (Signed)
Pt c/o swelling: STAT is pt has developed SOB within 24 hours  1) How much weight have you gained and in what time span? Not sure she states just that her legs are very swollen  2) If swelling, where is the swelling located? From hips down, in both legs  3) Are you currently taking a fluid pill? no  4) Are you currently SOB? No   5) Do you have a log of your daily weights (if so, list)? Doesn't have any  6) Have you gained 3 pounds in a day or 5 pounds in a week? Hasn't weighted herself  7) Have you traveled recently? No

## 2017-07-22 ENCOUNTER — Encounter: Payer: Self-pay | Admitting: Internal Medicine

## 2017-07-22 ENCOUNTER — Ambulatory Visit (INDEPENDENT_AMBULATORY_CARE_PROVIDER_SITE_OTHER): Payer: Medicare PPO | Admitting: Nurse Practitioner

## 2017-07-22 ENCOUNTER — Encounter: Payer: Self-pay | Admitting: Nurse Practitioner

## 2017-07-22 VITALS — BP 140/82 | HR 52 | Ht 66.0 in | Wt 248.0 lb

## 2017-07-22 DIAGNOSIS — I5031 Acute diastolic (congestive) heart failure: Secondary | ICD-10-CM

## 2017-07-22 DIAGNOSIS — I10 Essential (primary) hypertension: Secondary | ICD-10-CM | POA: Diagnosis not present

## 2017-07-22 DIAGNOSIS — I2510 Atherosclerotic heart disease of native coronary artery without angina pectoris: Secondary | ICD-10-CM

## 2017-07-22 MED ORDER — POTASSIUM CHLORIDE CRYS ER 20 MEQ PO TBCR: EXTENDED_RELEASE_TABLET | ORAL | 2 refills | Status: DC

## 2017-07-22 MED ORDER — FUROSEMIDE 20 MG PO TABS: ORAL_TABLET | ORAL | 2 refills | Status: DC

## 2017-07-22 NOTE — Patient Instructions (Signed)
Medication Instructions: - Your physician has recommended you make the following change in your medication:   1) START lasix (furosemide) 20 mg- take 2 tablets (40 mg) by mouth once daily x 3 days, then 1 tablet (20 mg) once daily  2) START potassium 20 meq- take 1 tablet (20 meq) by mouth once daily  Labwork: - Your physician recommends that you have lab work today: BMP/ CBC/ TSH  - Your physician recommends that you return for lab work in: 1 week- BMP  (please report to the [employer], 1st desk on the right to check in for labs)  Procedures/Testing: - Your physician has requested that you have an echocardiogram. Echocardiography is a painless test that uses sound waves to create images of your heart. It provides your doctor with information about the size and shape of your heart and how well your heart's chambers and valves are working. This procedure takes approximately one hour. There are no restrictions for this procedure.  Follow-Up: - Your physician recommends that you schedule a follow-up appointment in: 2 weeks with Ward Givens, PA   Any Additional Special Instructions Will Be Listed Below (If Applicable).     If you need a refill on your cardiac medications before your next appointment, please call your pharmacy.

## 2017-07-22 NOTE — Progress Notes (Signed)
Office Visit    Patient Name: Cheyenne Gray Date of Encounter: 07/22/2017  Primary Care Provider:  Emogene Morgan, MD Primary Cardiologist:  Julien Nordmann, MD  Chief Complaint    52 year old female with a history of chest pain and nonobstructive CAD, hypertension, palpitations, obesity, long QT syndrome, and family history of premature CAD, who presents for follow-up.  Past Medical History    Past Medical History:  Diagnosis Date  . Arthritis   . Asthma   . B12 deficiency   . Chronic back pain   . Chronic headaches   . Chronic pain    a. on methadone  . Coronary artery disease, non-occlusive    a. LHC 1/18: proximal to mid LAD 40% stenosed, mid LAD 30% stenosed, mid RCA 20% stenosed, distal RCA 20% stenosed, EF 55-65%, LVEDP normal  . Depression   . DJD (degenerative joint disease), multiple sites   . Hand, foot and mouth disease 2016  . History of echocardiogram    a. Echo 2014: EF 65-70%, nl WM, mildly dilated LA, PASP nl; b. 12/2014 Echo: EF 65-70%, no rwma, mod septal hypertrophy w/o LVOT gradient or SAM.  . History of shingles   . Hypertension   . Iron deficiency anemia   . Long QT interval   . Methadone use (HCC)    managed by Dr. Metta Clines  . Migraine   . Obesity   . Palpitations    a. 24 hour Holter: NSR, sinus brady down to 48, occasional PVCs & couplets, 8 beats NSVT; b. 30 day event monitor 2015: NSR with rare PVC.  Marland Kitchen Psoriasis   . Syncope and collapse   . Vitamin D deficiency    Past Surgical History:  Procedure Laterality Date  . BARIATRIC SURGERY  2001  . CARDIAC CATHETERIZATION Left 04/16/2016   Procedure: Left Heart Cath and Coronary Angiography;  Surgeon: Antonieta Iba, MD;  Location: [facility] INVASIVE CV LAB;  Service: Cardiovascular;  Laterality: Left;  . CHOLECYSTECTOMY  2001  . GALLBLADDER SURGERY    . GASTRIC BYPASS    . GASTROPLASTY      Allergies  Allergies  Allergen Reactions  . Penicillins Hives, Shortness Of Breath and Rash   Has patient had a PCN reaction causing immediate rash, facial/tongue/throat swelling, SOB or lightheadedness with hypotension: Yes Has patient had a PCN reaction causing severe rash involving mucus membranes or skin necrosis: No Has patient had a PCN reaction that required hospitalization No Has patient had a PCN reaction occurring within the last 10 years: No If all of the above answers are "NO", then may proceed with Cephalosporin use.     History of Present Illness    52 year old female with the above complex past medical history including family history of premature CAD, long QT syndrome, chest pain with nonobstructive CAD by catheterization in early 2018, hypertension, obesity status post bariatric surgery, and palpitations with prior finding of PVCs on Holter monitoring but without any significant findings on 30-day event monitor.  Prior echoes showed normal LV function, last of which was in October 2016.  She was last seen in clinic in January 2019, at which time she was doing reasonably well.  She says that over the past 2 months or so, she has been experiencing increasing lower extremity swelling, up into her thighs.  With this, she has noted some increasing dyspnea on exertion.  Her weight is up 16 pounds since her last visit.  She denies PND, orthopnea, chest pain,  dizziness, syncope, or palpitations.  She has some degree of chronic early satiety following bariatric surgery.  She does eat out a few days a week and does not typically add salt to food.  Home Medications    Prior to Admission medications   Medication Sig Start Date End Date Taking? Authorizing Provider  albuterol (PROVENTIL HFA;VENTOLIN HFA) 108 (90 BASE) MCG/ACT inhaler Inhale 2 puffs into the lungs as needed for wheezing.   Yes [provider]  Black Cohosh 175 MG CAPS Take 1 capsule by mouth daily.   Yes [provider]  busPIRone (BUSPAR) 7.5 MG tablet Take 7.5 mg by mouth 2 (two) times daily.   Yes  [provider]  folic acid (FOLVITE) 1 MG tablet Take 1 tablet (1 mg total) by mouth daily. 05/06/17  Yes Rickard Patience, MD  gabapentin (NEURONTIN) 400 MG capsule Limit 2 tablets in the a.m. and midday and 3 tablets each evening    Please dispense a three-month supply 11/15/15  Yes Ewing Schlein, MD  isosorbide mononitrate (IMDUR) 30 MG 24 hr tablet Take 1 tablet (30 mg total) by mouth daily. 04/03/17  Yes Antonieta Iba, MD  methadone (DOLOPHINE) 10 MG tablet Limit 1-2 tablets by mouth 2-3 times per day if tolerated 11/15/15  Yes Ewing Schlein, MD  nitroGLYCERIN (NITROSTAT) 0.4 MG SL tablet Place 1 tablet (0.4 mg total) under the tongue every 5 (five) minutes as needed for chest pain. 12/07/15 07/22/17 Yes Gollan, Tollie Pizza, MD  PARoxetine (PAXIL) 20 MG tablet Take 20 mg by mouth daily.    Yes [provider]  rOPINIRole (REQUIP) 0.25 MG tablet Take 0.5-0.75 mg by mouth at bedtime.    Yes [provider]  rosuvastatin (CRESTOR) 10 MG tablet Take 1 tablet (10 mg total) by mouth daily. 04/03/17  Yes Gollan, Tollie Pizza, MD  furosemide (LASIX) 20 MG tablet Take 2 tablets (40 mg) by mouth once daily x 3 days, then take 1 tablet (20 mg) by mouth once daily 07/22/17   Creig Hines, NP  potassium chloride SA (K-DUR,KLOR-CON) 20 MEQ tablet Take 1 tablet (20 meq) by mouth once daily 07/22/17   Creig Hines, NP    Review of Systems    Lower extremity swelling with weight gain and dyspnea on exertion as outlined above.  She denies chest pain, palpitations, PND, orthopnea, dizziness, syncope.  She has some degree of chronic early satiety.  All other systems reviewed and are otherwise negative except as noted above.  Physical Exam    VS:  BP 140/82 (BP Location: Left Arm, Patient Position: Sitting, Cuff Size: Large)   Pulse (!) 52   Ht  (1.676 m)   Wt 248 lb (112.5 kg)   BMI 40.03 kg/m  , BMI Body mass index is 40.03 kg/m. GEN: Well nourished, well  developed, in no acute distress.  HEENT: normal.  Neck: Supple, JVP approximately 10 to 12 cm, no carotid bruits, or masses. Cardiac: RRR, no murmurs, rubs, or gallops. No clubbing, cyanosis, 1-2+ bilateral lower extremity edema to the midcalf.  Radials/DP/PT 2+ and equal bilaterally.  Respiratory:  Respirations regular and unlabored, clear to auscultation bilaterally. GI: Soft, nontender, nondistended, BS + x 4. MS: no deformity or atrophy. Skin: warm and dry, no rash. Neuro:  Strength and sensation are intact. Psych: Normal affect.  Accessory Clinical Findings    ECG -sinus bradycardia, 51, left atrial enlargement, no acute ST or T changes.  QTc is 427.  Assessment &  Plan    1.  Acute presumably diastolic congestive heart failure: Patient presents with a 13-monthth history of progressive lower extremity edema and weight gain.  With this, she has noted some dyspnea on exertion.  She is chronic early satiety following bariatric surgery.  On exam, she has moderate volume excess.  I will check labs today including a blood count, basic metabolic panel, and TSH.  I will arrange for 2D echocardiogram.  I am adding Lasix 20 mg, 2 tabs daily for 3 days and then 1 tab daily.  I will add potassium chloride 20 mEq on top of this as she also has a history of hypokalemia.  Follow-up basic metabolic panel in 1 week.  I will plan to see back in clinic in 2 weeks or sooner if necessary.  We discussed the importance of daily weights, sodium restriction, medication compliance, and symptom reporting and she verbalizes understanding.   2.  Nonobstructive CAD/history of chest pain: No recent chest pain.  Catheterization in January 2018 showed nonobstructive disease.  She remains on isosorbide and statin therapy and has been doing well.  3.  Hyperlipidemia: LDL 47 in July 2018.  4.  Palpitations/PVCs: No significant arrhythmias on monitoring in 2015.  5.  Long QT syndrome: Stable by ECG today.  6.  Disposition:  Follow-up labs and echo.  Follow-up in clinic in 2 weeks or sooner if necessary.   Nicolasa Ducking, NP 07/22/2017, 4:36 PM

## 2017-07-26 ENCOUNTER — Other Ambulatory Visit
Admission: RE | Admit: 2017-07-26 | Discharge: 2017-07-26 | Disposition: A | Discharge status: Home / Self Care | Payer: Medicare PPO | Source: Ambulatory Visit | Attending: Family Medicine | Admitting: Family Medicine

## 2017-07-26 DIAGNOSIS — I5031 Acute diastolic (congestive) heart failure: Secondary | ICD-10-CM | POA: Insufficient documentation

## 2017-07-26 LAB — CBC WITH DIFFERENTIAL/PLATELET
BASOS ABS: 0 10*3/uL (ref 0–0.1)
BASOS PCT: 1 %
Eosinophils Absolute: 0.2 10*3/uL (ref 0–0.7)
Eosinophils Relative: 3 %
HEMATOCRIT: 33.6 % — AB (ref 35.0–47.0)
HEMOGLOBIN: 11.5 g/dL — AB (ref 12.0–16.0)
LYMPHS PCT: 29 %
Lymphs Abs: 1.6 10*3/uL (ref 1.0–3.6)
MCH: 30.4 pg (ref 26.0–34.0)
MCHC: 34.2 g/dL (ref 32.0–36.0)
MCV: 89 fL (ref 80.0–100.0)
Monocytes Absolute: 0.4 10*3/uL (ref 0.2–0.9)
Monocytes Relative: 8 %
NEUTROS ABS: 3.3 10*3/uL (ref 1.4–6.5)
NEUTROS PCT: 59 %
Platelets: 165 10*3/uL (ref 150–440)
RBC: 3.77 MIL/uL — ABNORMAL LOW (ref 3.80–5.20)
RDW: 13.5 % (ref 11.5–14.5)
WBC: 5.6 10*3/uL (ref 3.6–11.0)

## 2017-07-26 LAB — BASIC METABOLIC PANEL
ANION GAP: 6 (ref 5–15)
BUN: <5 mg/dL — ABNORMAL LOW (ref 6–20)
CHLORIDE: 103 mmol/L (ref 101–111)
CO2: 30 mmol/L (ref 22–32)
Calcium: 8.4 mg/dL — ABNORMAL LOW (ref 8.9–10.3)
Creatinine, Ser: 0.77 mg/dL (ref 0.44–1.00)
GFR calc Af Amer: >60 mL/min (ref >60)
GLUCOSE: 90 mg/dL (ref 65–99)
POTASSIUM: 3.3 mmol/L — AB (ref 3.5–5.1)
SODIUM: 139 mmol/L (ref 135–145)

## 2017-07-26 LAB — TSH: TSH: 3.037 u[IU]/mL (ref 0.350–4.500)

## 2017-07-27 ENCOUNTER — Other Ambulatory Visit: Payer: Self-pay | Admitting: *Deleted

## 2017-07-27 ENCOUNTER — Other Ambulatory Visit: Payer: Self-pay | Admitting: Oncology

## 2017-07-27 DIAGNOSIS — E876 Hypokalemia: Secondary | ICD-10-CM

## 2017-07-27 MED ORDER — POTASSIUM CHLORIDE CRYS ER 20 MEQ PO TBCR: 40.0000 meq | EXTENDED_RELEASE_TABLET | Freq: Every day | ORAL | 2 refills | Status: DC

## 2017-08-03 ENCOUNTER — Other Ambulatory Visit: Payer: Medicare Other

## 2017-08-05 ENCOUNTER — Encounter: Payer: Self-pay | Admitting: Oncology

## 2017-08-05 ENCOUNTER — Inpatient Hospital Stay: Payer: Medicare PPO | Attending: Oncology | Admitting: Oncology

## 2017-08-05 ENCOUNTER — Other Ambulatory Visit: Payer: Self-pay

## 2017-08-05 ENCOUNTER — Inpatient Hospital Stay: Payer: Medicare PPO

## 2017-08-05 ENCOUNTER — Ambulatory Visit: Payer: Medicare Other

## 2017-08-05 VITALS — BP 111/68 | HR 50 | Temp 97.7°F | Wt 234.0 lb

## 2017-08-05 DIAGNOSIS — D529 Folate deficiency anemia, unspecified: Secondary | ICD-10-CM

## 2017-08-05 DIAGNOSIS — D508 Other iron deficiency anemias: Secondary | ICD-10-CM | POA: Diagnosis not present

## 2017-08-05 DIAGNOSIS — Z9884 Bariatric surgery status: Secondary | ICD-10-CM | POA: Diagnosis not present

## 2017-08-05 DIAGNOSIS — E876 Hypokalemia: Secondary | ICD-10-CM | POA: Diagnosis not present

## 2017-08-05 LAB — CBC WITH DIFFERENTIAL/PLATELET
Basophils Absolute: 0.1 10*3/uL (ref 0–0.1)
Basophils Relative: 1 %
Eosinophils Absolute: 0.2 10*3/uL (ref 0–0.7)
Eosinophils Relative: 4 %
HEMATOCRIT: 34.3 % — AB (ref 35.0–47.0)
HEMOGLOBIN: 11.6 g/dL — AB (ref 12.0–16.0)
LYMPHS ABS: 2.2 10*3/uL (ref 1.0–3.6)
Lymphocytes Relative: 37 %
MCH: 30 pg (ref 26.0–34.0)
MCHC: 33.9 g/dL (ref 32.0–36.0)
MCV: 88.5 fL (ref 80.0–100.0)
MONOS PCT: 9 %
Monocytes Absolute: 0.5 10*3/uL (ref 0.2–0.9)
NEUTROS ABS: 2.9 10*3/uL (ref 1.4–6.5)
NEUTROS PCT: 49 %
Platelets: 211 10*3/uL (ref 150–440)
RBC: 3.88 MIL/uL (ref 3.80–5.20)
RDW: 13.1 % (ref 11.5–14.5)
WBC: 5.9 10*3/uL (ref 3.6–11.0)

## 2017-08-05 LAB — BASIC METABOLIC PANEL
Anion gap: 6 (ref 5–15)
CO2: 30 mmol/L (ref 22–32)
Calcium: 8.7 mg/dL — ABNORMAL LOW (ref 8.9–10.3)
Chloride: 103 mmol/L (ref 101–111)
Creatinine, Ser: 0.77 mg/dL (ref 0.44–1.00)
GFR calc Af Amer: >60 mL/min (ref >60)
GFR calc non Af Amer: >60 mL/min (ref >60)
Glucose, Bld: 75 mg/dL (ref 65–99)
POTASSIUM: 3.3 mmol/L — AB (ref 3.5–5.1)
Sodium: 139 mmol/L (ref 135–145)

## 2017-08-05 LAB — IRON AND TIBC
Iron: 42 ug/dL (ref 28–170)
Saturation Ratios: 12 % (ref 10.4–31.8)
TIBC: 345 ug/dL (ref 250–450)
UIBC: 303 ug/dL

## 2017-08-05 LAB — FERRITIN: Ferritin: 18 ng/mL (ref 11–307)

## 2017-08-05 LAB — FOLATE: Folate: 11 ng/mL (ref >5.9)

## 2017-08-05 NOTE — Progress Notes (Signed)
Patient here today for follow up.   

## 2017-08-05 NOTE — Progress Notes (Signed)
Hematology/Oncology  Follow up note [facility] [phone] Fax:([phone]) [phone]   Patient Care Team: Emogene Morgan, MD as PCP - General (Family Medicine) Antonieta Iba, MD as PCP - Cardiology (Cardiology) Emogene Morgan, MD as Referring Physician (Family Medicine) Antonieta Iba, MD as Consulting Physician (Cardiology)  REASON FOR VISIT Follow up for treatment of anemia. HISTORY OF PRESENTING ILLNESS:  This is a patient who used to follow up with Dr. Sherrlyn Hock presents for follow-up of management of her anemia.   History of gastric bypass in 2001 Patient was last seen by Dr. Sherrlyn Hock in July 2016 for iron deficiency.  She was again being seen by nurse practitioner on August 21, 2015 for follow-up of her iron deficiency anemia.  She has a history of iron deficiency anemia remote gastric bypass history in 2001.  She used to IV iron infusion with venofer  Every 3 months but she has a follow-up with [place] for about a year.  Patient reports feeling fatigued.  She did not have much of appetite.  Denies any nausea vomiting diarrhea or abdominal pain.  She has no unintentional weight loss, instead she feels that she has gained a few pounds.  INTERVAL HISTORY [place] BIANCE MONCRIEF is a 52 y.o. female who has above history reviewed by me today presents for follow up visit for management of chronic anemia, iron deficiency. She follows up with cardiology for CAD, long QT syndrome, chest pain, lower extremity edema. She was diagnosed with presumed diastolic CHF. Going to have 2D echo done.   She continues to feel fatigue, and also being forgetful.  She tells me that she is in the process of switching her PCP.   Review of Systems  Constitutional: Positive for malaise/fatigue. Negative for chills, fever and weight loss.  HENT: Negative for hearing loss and nosebleeds.   Eyes: Negative for photophobia and pain.  Respiratory: Negative for cough and sputum production.     Cardiovascular: Negative for chest pain and palpitations.  Gastrointestinal: Negative for abdominal pain, heartburn, nausea and vomiting.  Genitourinary: Negative for dysuria.  Musculoskeletal: Negative for myalgias.  Skin: Negative for rash.  Neurological: Negative for dizziness.  Endo/Heme/Allergies: Does not bruise/bleed easily.  Psychiatric/Behavioral: Negative for depression.    MEDICAL HISTORY:  Past Medical History:  Diagnosis Date  . Arthritis   . Asthma   . B12 deficiency   . Chronic back pain   . Chronic headaches   . Chronic pain    a. on methadone  . Coronary artery disease, non-occlusive    a. LHC 1/18: proximal to mid LAD 40% stenosed, mid LAD 30% stenosed, mid RCA 20% stenosed, distal RCA 20% stenosed, EF 55-65%, LVEDP normal  . Depression   . DJD (degenerative joint disease), multiple sites   . Hand, foot and mouth disease 2016  . History of echocardiogram    a. Echo 2014: EF 65-70%, nl WM, mildly dilated LA, PASP nl; b. 12/2014 Echo: EF 65-70%, no rwma, mod septal hypertrophy w/o LVOT gradient or SAM.  . History of shingles   . Hypertension   . Iron deficiency anemia   . Long QT interval   . Methadone use (HCC)    managed by Dr. Metta Clines  . Migraine   . Obesity   . Palpitations    a. 24 hour Holter: NSR, sinus brady down to 48, occasional PVCs & couplets, 8 beats NSVT; b. 30 day event monitor 2015: NSR with rare PVC.  Marland Kitchen Psoriasis   .  Syncope and collapse   . Vitamin D deficiency     SURGICAL HISTORY: Past Surgical History:  Procedure Laterality Date  . BARIATRIC SURGERY  2001  . CARDIAC CATHETERIZATION Left 04/16/2016   Procedure: Left Heart Cath and Coronary Angiography;  Surgeon: Antonieta Iba, MD;  Location: [facility] INVASIVE CV LAB;  Service: Cardiovascular;  Laterality: Left;  . CHOLECYSTECTOMY  2001  . GALLBLADDER SURGERY    . GASTRIC BYPASS    . GASTROPLASTY      SOCIAL HISTORY: Social History   Socioeconomic History  . Marital status:  Widowed    Spouse name: Not on file  . Number of children: Not on file  . Years of education: Not on file  . Highest education level: Not on file  Occupational History  . Not on file  Social Needs  . Financial resource strain: Not on file  . Food insecurity:    Worry: Not on file    Inability: Not on file  . Transportation needs:    Medical: Not on file    Non-medical: Not on file  Tobacco Use  . Smoking status: Never Smoker  . Smokeless tobacco: Never Used  Substance and Sexual Activity  . Alcohol use: No    Alcohol/week: 0.0 oz  . Drug use: No  . Sexual activity: Not on file  Lifestyle  . Physical activity:    Days per week: Not on file    Minutes per session: Not on file  . Stress: Not on file  Relationships  . Social connections:    Talks on phone: Not on file    Gets together: Not on file    Attends religious service: Not on file    Active member of club or organization: Not on file    Attends meetings of clubs or organizations: Not on file    Relationship status: Not on file  . Intimate partner violence:    Fear of current or ex partner: Not on file    Emotionally abused: Not on file    Physically abused: Not on file    Forced sexual activity: Not on file  Other Topics Concern  . Not on file  Social History Narrative   ** Merged History Encounter **        FAMILY HISTORY: Family History  Problem Relation Age of Onset  . Heart attack Mother   . Cancer Mother   . Heart attack Father 11       MI  . Heart attack Brother   . Heart disease Brother   . Heart attack Maternal Grandmother   . Cancer Maternal Grandmother   . Cancer Maternal Uncle     ALLERGIES:  is allergic to penicillins.  MEDICATIONS:  Current Outpatient Medications  Medication Sig Dispense Refill  . albuterol (PROVENTIL HFA;VENTOLIN HFA) 108 (90 BASE) MCG/ACT inhaler Inhale 2 puffs into the lungs as needed for wheezing.    . Black Cohosh 175 MG CAPS Take 1 capsule by mouth daily.      . busPIRone (BUSPAR) 7.5 MG tablet Take 7.5 mg by mouth 2 (two) times daily.    . folic acid (FOLVITE) 1 MG tablet TAKE 1 TABLET BY MOUTH EVERY DAY 30 tablet 0  . furosemide (LASIX) 20 MG tablet Take 2 tablets (40 mg) by mouth once daily x 3 days, then take 1 tablet (20 mg) by mouth once daily 35 tablet 2  . gabapentin (NEURONTIN) 400 MG capsule Limit 2 tablets in the a.m. and  midday and 3 tablets each evening    Please dispense a three-month supply 630 capsule 0  . isosorbide mononitrate (IMDUR) 30 MG 24 hr tablet Take 1 tablet (30 mg total) by mouth daily. 90 tablet 4  . methadone (DOLOPHINE) 10 MG tablet Limit 1-2 tablets by mouth 2-3 times per day if tolerated 180 tablet 0  . nitroGLYCERIN (NITROSTAT) 0.4 MG SL tablet Place 1 tablet (0.4 mg total) under the tongue every 5 (five) minutes as needed for chest pain. 25 tablet 3  . PARoxetine (PAXIL) 20 MG tablet Take 20 mg by mouth daily.     . potassium chloride SA (K-DUR,KLOR-CON) 20 MEQ tablet Take 2 tablets (40 mEq total) by mouth daily. 30 tablet 2  . rOPINIRole (REQUIP) 0.25 MG tablet Take 0.5-0.75 mg by mouth at bedtime.     . rosuvastatin (CRESTOR) 10 MG tablet Take 1 tablet (10 mg total) by mouth daily. 90 tablet 4   Current Facility-Administered Medications  Medication Dose Route Frequency Provider Last Rate Last Dose  . bupivacaine (PF) (MARCAINE) 0.25 % injection 30 mL  30 mL Other Once Ewing Schlein, MD      . bupivacaine (PF) (MARCAINE) 0.25 % injection 30 mL  30 mL Other Once Ewing Schlein, MD      . fentaNYL (SUBLIMAZE) injection 100 mcg  100 mcg Intravenous Once Ewing Schlein, MD      . fentaNYL (SUBLIMAZE) injection 100 mcg  100 mcg Intravenous Once Ewing Schlein, MD      . lactated ringers infusion 1,000 mL  1,000 mL Intravenous Continuous Ewing Schlein, MD      . lactated ringers infusion 1,000 mL  1,000 mL Intravenous Continuous Ewing Schlein, MD      . lactated ringers infusion 1,000 mL  1,000 mL Intravenous  Continuous Ewing Schlein, MD 125 mL/hr at 08/06/15 1002 1,000 mL at 08/06/15 1002  . lidocaine (PF) (XYLOCAINE) 1 % injection 10 mL  10 mL Subcutaneous Once Ewing Schlein, MD      . midazolam (VERSED) 5 MG/5ML injection 5 mg  5 mg Intravenous Once Ewing Schlein, MD      . orphenadrine (NORFLEX) injection 60 mg  60 mg Intramuscular Once Ewing Schlein, MD      . orphenadrine (NORFLEX) injection 60 mg  60 mg Intramuscular Once Ewing Schlein, MD      . triamcinolone acetonide (KENALOG-40) injection 40 mg  40 mg Other Once Ewing Schlein, MD      . triamcinolone acetonide (KENALOG-40) injection 40 mg  40 mg Other Once Ewing Schlein, MD         PHYSICAL EXAMINATION: ECOG PERFORMANCE STATUS: 1 - Symptomatic but completely ambulatory Vitals:   08/05/17 1416  BP: 111/68  Pulse: (!) 50  Temp: 97.7 F (36.5 C)   Filed Weights   08/05/17 1416  Weight: 234 lb (106.1 kg)    Physical Exam  Constitutional: She is oriented to person, place, and time and well-developed, well-nourished, and in no distress. No distress.  Obese  HENT:  Head: Normocephalic and atraumatic.  Nose: Nose normal.  Mouth/Throat: Oropharynx is clear and moist. No oropharyngeal exudate.  Eyes: Pupils are equal, round, and reactive to light. Conjunctivae and EOM are normal. Left eye exhibits no discharge. No scleral icterus.  Neck: Normal range of motion. Neck supple. No JVD present.  Cardiovascular: Normal rate, regular rhythm and normal heart sounds.  No murmur heard. Pulmonary/Chest: Effort normal and breath sounds normal. No respiratory distress. She has no wheezes.  She has no rales. She exhibits no tenderness.  Abdominal: Soft. Bowel sounds are normal. She exhibits no distension and no mass. There is no tenderness. There is no rebound.  Musculoskeletal: Normal range of motion. She exhibits no edema, tenderness or deformity.  Lymphadenopathy:    She has no cervical adenopathy.  Neurological: She is alert and  oriented to person, place, and time. No cranial nerve deficit. She exhibits normal muscle tone. Coordination normal.  Skin: Skin is warm and dry. No rash noted. She is not diaphoretic. No erythema.  Psychiatric: Affect and judgment normal.     LABORATORY DATA:  I have reviewed the data as listed Lab Results  Component Value Date   WBC 5.6 07/26/2017   HGB 11.5 (L) 07/26/2017   HCT 33.6 (L) 07/26/2017   MCV 89.0 07/26/2017   PLT 165 07/26/2017   Recent Labs    10/02/16 1037 04/21/17 1118 05/06/17 1144 07/26/17 1447  NA  --  139  --  139  K  --  3.0* 4.2 3.3*  CL  --  102  --  103  CO2  --  30  --  30  GLUCOSE  --  106*  --  90  BUN  --  5*  --  <5*  CREATININE  --  0.94  --  0.77  CALCIUM  --  8.7*  --  8.4*  GFRNONAA  --  >60  --  >60  GFRAA  --  >60  --  >60  PROT 6.6 6.8  --   --   ALBUMIN 3.7 3.4*  --   --   AST 52* 46*  --   --   ALT 27 25  --   --   ALKPHOS 152* 109  --   --   BILITOT 0.3 0.4  --   --   BILIDIR 0.09  --   --   --        ASSESSMENT & PLAN:  1. Other iron deficiency anemia   2. Anemia due to folic acid deficiency, unspecified deficiency type   3. Gastric bypass status for obesity    # Iron panel reviewed, ferritin 18, borderline iron saturation. Plan IV venofer  x 4 and repeat test in 3 months.  # Folic acid deficiency:improved.  continue folic acid  daily.  # Hypokalemia:  Likely secondary to lasix use, chronic. Patient takes K Dur daily.   All questions were answered. The patient knows to call the clinic with any problems questions or concerns.  Return of visit: 3 months repeat labs prior to visit, and possible Venofer.    Rickard Patience, MD, PhD Hematology Oncology [facility] at [facility] Pager- [phone] 08/05/2017

## 2017-08-11 ENCOUNTER — Other Ambulatory Visit: Payer: Self-pay

## 2017-08-11 ENCOUNTER — Ambulatory Visit (INDEPENDENT_AMBULATORY_CARE_PROVIDER_SITE_OTHER): Payer: Medicare PPO

## 2017-08-11 DIAGNOSIS — I5031 Acute diastolic (congestive) heart failure: Secondary | ICD-10-CM

## 2017-08-12 ENCOUNTER — Encounter: Payer: Self-pay | Admitting: Nurse Practitioner

## 2017-08-12 ENCOUNTER — Ambulatory Visit (INDEPENDENT_AMBULATORY_CARE_PROVIDER_SITE_OTHER): Payer: Medicare PPO | Admitting: Nurse Practitioner

## 2017-08-12 VITALS — BP 120/70 | HR 54 | Ht 65.0 in | Wt 233.0 lb

## 2017-08-12 DIAGNOSIS — I5032 Chronic diastolic (congestive) heart failure: Secondary | ICD-10-CM

## 2017-08-12 DIAGNOSIS — E876 Hypokalemia: Secondary | ICD-10-CM

## 2017-08-12 DIAGNOSIS — I4581 Long QT syndrome: Secondary | ICD-10-CM | POA: Diagnosis not present

## 2017-08-12 MED ORDER — POTASSIUM CHLORIDE ER 10 MEQ PO TBCR: EXTENDED_RELEASE_TABLET | ORAL | 3 refills | Status: DC

## 2017-08-12 NOTE — Patient Instructions (Signed)
Medication Instructions: - Your physician has recommended you make the following change in your medication:   1) STOP the 20 meq tablets of potassium 2) START potassium 10 meq- take 4 tablets (40 meq) by mouth twice daily  Labwork: - Your physician recommends that you return for lab work in: 1 week- BMP (please go to the [facility] entrance of the hospital- 1st desk on the right to check in)  Procedures/Testing: - none ordered  Follow-Up: - Your physician recommends that you schedule a follow-up appointment in: 3 months with Dr. Mariah Milling.    Any Additional Special Instructions Will Be Listed Below (If Applicable).     If you need a refill on your cardiac medications before your next appointment, please call your pharmacy.

## 2017-08-12 NOTE — Progress Notes (Signed)
Office Visit    Patient Name: Cheyenne Gray Date of Encounter: 08/12/2017  Primary Care Provider:  Emogene Morgan, MD Primary Cardiologist:  Julien Nordmann, MD  Chief Complaint    52 year old female with a history of chest pain and nonobstructive CAD, hypertension, palpitations, obesity, long QT syndrome, and family history of premature CAD, who presents for follow-up related to lower extremity swelling.  Past Medical History    Past Medical History:  Diagnosis Date  . (HFpEF) heart failure with preserved ejection fraction (HCC)    a. Echo 2014: EF 65-70%, nl WM, mildly dilated LA, PASP nl; b. 12/2014 Echo: EF 65-70%, no rwma, mod septal hypertrophy w/o LVOT gradient or SAM; c. 07/2017 Echo: EF 55-60%, no rwma, mildly dil RV w/ nl syst fxn. Mildly dil RA. Dilated IVC w/ elevated CVP. Triv post effusion.  . Arthritis   . Asthma   . B12 deficiency   . Chronic back pain   . Chronic headaches   . Chronic pain    a. on methadone  . Coronary artery disease, non-occlusive    a. LHC 1/18: proximal to mid LAD 40% stenosed, mid LAD 30% stenosed, mid RCA 20% stenosed, distal RCA 20% stenosed, EF 55-65%, LVEDP normal  . Depression   . DJD (degenerative joint disease), multiple sites   . Hand, foot and mouth disease 2016  . History of shingles   . Hypertension   . Iron deficiency anemia   . Long QT interval   . Methadone use (HCC)    managed by Dr. Metta Clines  . Migraine   . Obesity   . Palpitations    a. 24 hour Holter: NSR, sinus brady down to 48, occasional PVCs & couplets, 8 beats NSVT; b. 30 day event monitor 2015: NSR with rare PVC.  Marland Kitchen Psoriasis   . Syncope and collapse   . Vitamin D deficiency    Past Surgical History:  Procedure Laterality Date  . BARIATRIC SURGERY  2001  . CARDIAC CATHETERIZATION Left 04/16/2016   Procedure: Left Heart Cath and Coronary Angiography;  Surgeon: Antonieta Iba, MD;  Location: [facility] INVASIVE CV LAB;  Service: Cardiovascular;  Laterality:  Left;  . CHOLECYSTECTOMY  2001  . GALLBLADDER SURGERY    . GASTRIC BYPASS    . GASTROPLASTY      Allergies  Allergies  Allergen Reactions  . Penicillins Hives, Shortness Of Breath and Rash    Has patient had a PCN reaction causing immediate rash, facial/tongue/throat swelling, SOB or lightheadedness with hypotension: Yes Has patient had a PCN reaction causing severe rash involving mucus membranes or skin necrosis: No Has patient had a PCN reaction that required hospitalization No Has patient had a PCN reaction occurring within the last 10 years: No If all of the above answers are "NO", then may proceed with Cephalosporin use.     History of Present Illness    52 year old female with the above complex past medical history including family history of premature CAD, long QT syndrome, chest pain with nonobstructive CAD by catheterization in early 2018, hypertension, obesity status post bariatric surgery, and palpitations with prior finding of PVCs on Holter monitoring but without any significant findings on 30-day event monitor.  Prior echo in October 2016 showed normal LV function.  I saw her in clinic on May 8 with complaints of increasing lower extremity swelling and 16 pound weight gain.  She was edematous on exam with moderately elevated JV P.  I prescribed Lasix 20  mg and she took it twice a day for 3 days and has been taking it daily since.  She has also been taking potassium chloride 40 mEq daily in the setting of prior history of hypokalemia.  I repeated an echo which again showed normal LV function.    Since her last visit, her weight is down 15 pounds.  She has noted improvement in swelling overall though she still notes lower extremity edema in the late afternoon and early evening after either sitting or being on her feet all day.  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  She is not particularly careful with her salt intake and also drinks 8 coca colas a day.  She had  follow-up labs on the 22nd which showed potassium 3.3 and normal renal function.  She denies chest pain, dyspnea, PND, orthopnea, dizziness, syncope, or early satiety.  Home Medications    Prior to Admission medications   Medication Sig Start Date End Date Taking? Authorizing Provider  albuterol (PROVENTIL HFA;VENTOLIN HFA) 108 (90 BASE) MCG/ACT inhaler Inhale 2 puffs into the lungs as needed for wheezing.    [provider]  Black Cohosh 175 MG CAPS Take 1 capsule by mouth daily.    [provider]  busPIRone (BUSPAR) 7.5 MG tablet Take 7.5 mg by mouth 2 (two) times daily.    [provider]  diazepam (VALIUM) 5 MG tablet Take 5 mg by mouth. 07/19/17   [provider]  folic acid (FOLVITE) 1 MG tablet TAKE 1 TABLET BY MOUTH EVERY DAY 07/27/17   Rickard Patience, MD  furosemide (LASIX) 20 MG tablet Take 2 tablets (40 mg) by mouth once daily x 3 days, then take 1 tablet (20 mg) by mouth once daily 07/22/17   Creig Hines, NP  gabapentin (NEURONTIN) 400 MG capsule Limit 2 tablets in the a.m. and midday and 3 tablets each evening    Please dispense a three-month supply 11/15/15   Ewing Schlein, MD  isosorbide mononitrate (IMDUR) 30 MG 24 hr tablet Take 1 tablet (30 mg total) by mouth daily. 04/03/17   Antonieta Iba, MD  methadone (DOLOPHINE) 10 MG tablet Limit 1-2 tablets by mouth 2-3 times per day if tolerated 11/15/15   Ewing Schlein, MD  nitroGLYCERIN (NITROSTAT) 0.4 MG SL tablet Place 1 tablet (0.4 mg total) under the tongue every 5 (five) minutes as needed for chest pain. 12/07/15 07/22/17  Antonieta Iba, MD  PARoxetine (PAXIL) 20 MG tablet Take 20 mg by mouth daily.     [provider]  potassium chloride SA (K-DUR,KLOR-CON) 20 MEQ tablet Take 2 tablets (40 mEq total) by mouth daily. 07/27/17   Creig Hines, NP  rOPINIRole (REQUIP) 0.25 MG tablet Take 0.5-0.75 mg by mouth at bedtime.     [provider]  rosuvastatin  (CRESTOR) 10 MG tablet Take 1 tablet (10 mg total) by mouth daily. 04/03/17   Antonieta Iba, MD    Review of Systems    Still some dependent edema in the mid to late day.  Overall improved after initiating Lasix.  All other systems reviewed and are otherwise negative except as noted above.  Physical Exam    VS:  BP 120/70 (BP Location: Left Arm, Patient Position: Sitting, Cuff Size: Large)   Pulse (!) 54   Ht  (1.651 m)   Wt 233 lb (105.7 kg)   BMI 38.77 kg/m  , BMI Body mass index is 38.77 kg/m. GEN:  Well nourished, well developed, in no acute distress.  HEENT: normal.  Neck: Supple, no JVD, carotid bruits, or masses. Cardiac: RRR, no murmurs, rubs, or gallops. No clubbing, cyanosis, trace to 1+ bilateral ankle edema.  Radials/DP/PT 2+ and equal bilaterally.  Respiratory:  Respirations regular and unlabored, clear to auscultation bilaterally. GI: Obese, soft, nontender, nondistended, BS + x 4. MS: no deformity or atrophy. Skin: warm and dry, no rash. Neuro:  Strength and sensation are intact. Psych: Normal affect.  Accessory Clinical Findings    ECG -sinus bradycardia, 54, first-degree AV block, left atrial enlargement, lateral infarct.  QTc 441.  Assessment & Plan    1.  Chronic diastolic congestive heart failure: I saw patient several weeks ago with increasing lower extremity swelling.  Follow-up echo shows normal LV function.  She has responded well to Lasix and weight is down 15 pounds since her last visit.  Renal function was stable on May 22.  Potassium was low at 3.3.  She has had significant improvement in lower extremity swelling though still notes dependent edema.  We discussed her salt intake and likely benefit of compression stockings.  She has a pair and will consider wearing there was concerned about how the look in the summer.  She admits that she frequently will have processed foods and also drinks 8 coca colas a day.  I will continue Lasix at 20 mg daily.   Heart rate and blood pressure well controlled.  I am going to increase her potassium chloride to 40 mEq twice daily.  Given her history of gastric bypass and some difficulty swallowing the larger tabs, we are prescribing 10 mEq tablets.  I will follow-up a basic metabolic panel in 1 week.  2.  Nonobstructive CAD/history of chest pain: No recent chest pain.  Catheter ablation January 2018 showed nonobstructive disease.  She remains on isosorbide and statin therapy.  3.  Hyperlipidemia: LDL 47 in July 2018.  4.  Hypokalemia: Potassium 3.3 on May 22.  Increasing potassium to 40 twice daily.  Follow-up  basic metabolic panel in 1 week.  5.  Palpitations/PVCs: No recurrence.  No significant arrhythmias on prior monitoring.  6.  Long QT syndrome: Stable by ECG today.  Supplement potassium as above.  7.  Disposition: Follow-up basic metabolic panel in 1 week.  Follow-up in clinic in 3 months or sooner if necessary.  Nicolasa Ducking, NP 08/12/2017, 8:47 AM

## 2017-08-14 ENCOUNTER — Inpatient Hospital Stay: Payer: Medicare PPO

## 2017-08-14 VITALS — BP 111/77 | HR 54 | Temp 98.2°F | Resp 18

## 2017-08-14 DIAGNOSIS — D508 Other iron deficiency anemias: Secondary | ICD-10-CM

## 2017-08-14 MED ORDER — SODIUM CHLORIDE 0.9 % IV SOLN
Freq: Once | INTRAVENOUS | Status: AC
  Administered 2017-08-14: 12:00:00 via INTRAVENOUS
  Filled 2017-08-14: qty 1000

## 2017-08-14 MED ORDER — IRON SUCROSE 20 MG/ML IV SOLN
200.0000 mg | Freq: Once | INTRAVENOUS | Status: AC
  Administered 2017-08-14: 200 mg via INTRAVENOUS
  Filled 2017-08-14: qty 10

## 2017-08-18 ENCOUNTER — Encounter

## 2017-08-18 ENCOUNTER — Encounter: Payer: Self-pay | Admitting: Internal Medicine

## 2017-08-18 ENCOUNTER — Other Ambulatory Visit: Payer: Self-pay | Admitting: Internal Medicine

## 2017-08-18 ENCOUNTER — Ambulatory Visit (INDEPENDENT_AMBULATORY_CARE_PROVIDER_SITE_OTHER): Payer: Medicare PPO | Admitting: Internal Medicine

## 2017-08-18 VITALS — BP 118/78 | HR 54 | Temp 97.9°F | Ht 66.0 in | Wt 234.2 lb

## 2017-08-18 DIAGNOSIS — R945 Abnormal results of liver function studies: Secondary | ICD-10-CM

## 2017-08-18 DIAGNOSIS — M810 Age-related osteoporosis without current pathological fracture: Secondary | ICD-10-CM | POA: Diagnosis not present

## 2017-08-18 DIAGNOSIS — I5030 Unspecified diastolic (congestive) heart failure: Secondary | ICD-10-CM

## 2017-08-18 DIAGNOSIS — I5032 Chronic diastolic (congestive) heart failure: Secondary | ICD-10-CM | POA: Insufficient documentation

## 2017-08-18 DIAGNOSIS — F329 Major depressive disorder, single episode, unspecified: Secondary | ICD-10-CM

## 2017-08-18 DIAGNOSIS — I2510 Atherosclerotic heart disease of native coronary artery without angina pectoris: Secondary | ICD-10-CM

## 2017-08-18 DIAGNOSIS — R197 Diarrhea, unspecified: Secondary | ICD-10-CM

## 2017-08-18 DIAGNOSIS — R7989 Other specified abnormal findings of blood chemistry: Secondary | ICD-10-CM

## 2017-08-18 DIAGNOSIS — E559 Vitamin D deficiency, unspecified: Secondary | ICD-10-CM

## 2017-08-18 DIAGNOSIS — D508 Other iron deficiency anemias: Secondary | ICD-10-CM

## 2017-08-18 DIAGNOSIS — R0681 Apnea, not elsewhere classified: Secondary | ICD-10-CM

## 2017-08-18 DIAGNOSIS — Z1231 Encounter for screening mammogram for malignant neoplasm of breast: Secondary | ICD-10-CM | POA: Diagnosis not present

## 2017-08-18 DIAGNOSIS — F419 Anxiety disorder, unspecified: Secondary | ICD-10-CM

## 2017-08-18 DIAGNOSIS — R413 Other amnesia: Secondary | ICD-10-CM | POA: Diagnosis not present

## 2017-08-18 DIAGNOSIS — Z13818 Encounter for screening for other digestive system disorders: Secondary | ICD-10-CM

## 2017-08-18 DIAGNOSIS — R41840 Attention and concentration deficit: Secondary | ICD-10-CM | POA: Diagnosis not present

## 2017-08-18 DIAGNOSIS — G4700 Insomnia, unspecified: Secondary | ICD-10-CM | POA: Diagnosis not present

## 2017-08-18 DIAGNOSIS — R319 Hematuria, unspecified: Secondary | ICD-10-CM

## 2017-08-18 DIAGNOSIS — E876 Hypokalemia: Secondary | ICD-10-CM | POA: Diagnosis not present

## 2017-08-18 DIAGNOSIS — R55 Syncope and collapse: Secondary | ICD-10-CM

## 2017-08-18 DIAGNOSIS — F32A Depression, unspecified: Secondary | ICD-10-CM

## 2017-08-18 LAB — HEPATIC FUNCTION PANEL
ALBUMIN: 3.7 g/dL (ref 3.5–5.2)
ALT: 11 U/L (ref 0–35)
AST: 20 U/L (ref 0–37)
Alkaline Phosphatase: 99 U/L (ref 39–117)
Bilirubin, Direct: 0.1 mg/dL (ref 0.0–0.3)
TOTAL PROTEIN: 6.7 g/dL (ref 6.0–8.3)
Total Bilirubin: 0.4 mg/dL (ref 0.2–1.2)

## 2017-08-18 LAB — VITAMIN D 25 HYDROXY (VIT D DEFICIENCY, FRACTURES): VITD: 19.48 ng/mL — AB (ref 30.00–100.00)

## 2017-08-18 LAB — MAGNESIUM: Magnesium: 1.6 mg/dL (ref 1.5–2.5)

## 2017-08-18 MED ORDER — CHOLECALCIFEROL 1.25 MG (50000 UT) PO CAPS: 50000.0000 [IU] | ORAL_CAPSULE | ORAL | 1 refills | Status: DC

## 2017-08-18 NOTE — Patient Instructions (Signed)
Please try Sarna lotion and cetaphil or cerave cream for skin  And dove soap white or unscented   GI Lung  Neurology  Psychiatry  Mammogram, bone density and liver ultrasound    Hypoglycemia Hypoglycemia occurs when the level of sugar (glucose) in the blood is too low. Glucose is a type of sugar that provides the body's main source of energy. Certain hormones (insulin and glucagon) control the level of glucose in the blood. Insulin lowers blood glucose, and glucagon increases blood glucose. Hypoglycemia can result from having too much insulin in the bloodstream, or from not eating enough food that contains glucose. Hypoglycemia can happen in people who do or do not have diabetes. It can develop quickly, and it can be a medical emergency. What are the causes? Hypoglycemia occurs most often in people who have diabetes. If you have diabetes, hypoglycemia may be caused by:  Diabetes medicine.  Not eating enough, or not eating often enough.  Increased physical activity.  Drinking alcohol, especially when you have not eaten recently.  If you do not have diabetes, hypoglycemia may be caused by:  A tumor in the pancreas. The pancreas is the organ that makes insulin.  Not eating enough, or not eating for long periods at a time (fasting).  Severe infection or illness that affects the liver, heart, or kidneys.  Certain medicines.  You may also have reactive hypoglycemia. This condition causes hypoglycemia within 4 hours of eating a meal. This may occur after having stomach surgery. Sometimes, the cause of reactive hypoglycemia is not known. What increases the risk? Hypoglycemia is more likely to develop in:  People who have diabetes and take medicines to lower blood glucose.  People who abuse alcohol.  People who have a severe illness.  What are the signs or symptoms? Hypoglycemia may not cause any symptoms. If you have symptoms, they may include:  Hunger.  Anxiety.  Sweating  and feeling clammy.  Confusion.  Dizziness or feeling light-headed.  Sleepiness.  Nausea.  Increased heart rate.  Headache.  Blurry vision.  Seizure.  Nightmares.  Tingling or numbness around the mouth, lips, or tongue.  A change in speech.  Decreased ability to concentrate.  A change in coordination.  Restless sleep.  Tremors or shakes.  Fainting.  Irritability.  How is this diagnosed? Hypoglycemia is diagnosed with a blood test to measure your blood glucose level. This blood test is done while you are having symptoms. Your health care provider may also do a physical exam and review your medical history. If you do not have diabetes, other tests may be done to find the cause of your hypoglycemia. How is this treated? This condition can often be treated by immediately eating or drinking something that contains glucose, such as:  3-4 sugar tablets (glucose pills).  Glucose gel, 15-gram tube.  Fruit juice, 4 oz (120 mL).  Regular soda (not diet soda), 4 oz (120 mL).  Low-fat milk, 4 oz (120 mL).  Several pieces of hard candy.  Sugar or honey, 1 Tbsp.  Treating Hypoglycemia If You Have Diabetes  If you are alert and able to swallow safely, follow the 15:15 rule:  Take 15 grams of a rapid-acting carbohydrate. Rapid-acting options include: ? 1 tube of glucose gel. ? 3 glucose pills. ? 6-8 pieces of hard candy. ? 4 oz (120 mL) of fruit juice. ? 4 oz (120 ml) of regular (not diet) soda.  Check your blood glucose 15 minutes after you take the carbohydrate.  If the repeat blood glucose level is still at or below 70 mg/dL (3.9 mmol/L), take 15 grams of a carbohydrate again.  If your blood glucose level does not increase above 70 mg/dL (3.9 mmol/L) after 3 tries, seek emergency medical care.  After your blood glucose level returns to normal, eat a meal or a snack within 1 hour.  Treating Severe Hypoglycemia Severe hypoglycemia is when your blood glucose  level is at or below 54 mg/dL (3 mmol/L). Severe hypoglycemia is an emergency. Do not wait to see if the symptoms will go away. Get medical help right away. Call your local emergency services (911 in the [place] Do not drive yourself to the hospital. If you have severe hypoglycemia and you cannot eat or drink, you may need an injection of glucagon. A family member or close friend should learn how to check your blood glucose and how to give you a glucagon injection. Ask your health care provider if you need to have an emergency glucagon injection kit available. Severe hypoglycemia may need to be treated in a hospital. The treatment may include getting glucose through an IV tube. You may also need treatment for the cause of your hypoglycemia. Follow these instructions at home: General instructions  Avoid any diets that cause you to not eat enough food. Talk with your health care provider before you start any new diet.  Take over-the-counter and prescription medicines only as told by your health care provider.  Limit alcohol intake to no more than 1 drink per day for nonpregnant women and 2 drinks per day for men. One drink equals 12 oz of beer, 5 oz of wine, or 1 oz of hard liquor.  Keep all follow-up visits as told by your health care provider. This is important. If You Have Diabetes:   Make sure you know the symptoms of hypoglycemia.  Always have a rapid-acting carbohydrate snack with you to treat low blood sugar.  Follow your diabetes management plan, as told by your health care provider. Make sure you: ? Take your medicines as directed. ? Follow your exercise plan. ? Follow your meal plan. Eat on time, and do not skip meals. ? Check your blood glucose as often as directed. Make sure to check your blood glucose before and after exercise. If you exercise longer or in a different way than usual, check your blood glucose more often. ? Follow your sick day plan whenever you cannot eat or drink  normally. Make this plan in advance with your health care provider.  Share your diabetes management plan with people in your workplace, school, and household.  Check your urine for ketones when you are ill and as told by your health care provider.  Carry a medical alert card or wear medical alert jewelry. If You Have Reactive Hypoglycemia or Low Blood Sugar From Other Causes:  Monitor your blood glucose as told by your health care provider.  Follow instructions from your health care provider about eating or drinking restrictions. Contact a health care provider if:  You have problems keeping your blood glucose in your target range.  You have frequent episodes of hypoglycemia. Get help right away if:  You continue to have hypoglycemia symptoms after eating or drinking something containing glucose.  Your blood glucose is at or below 54 mg/dL (3 mmol/L).  You have a seizure.  You faint. These symptoms may represent a serious problem that is an emergency. Do not wait to see if the symptoms will go away.  Get medical help right away. Call your local emergency services (911 in the [place] Do not drive yourself to the hospital. This information is not intended to replace advice given to you by your health care provider. Make sure you discuss any questions you have with your health care provider. Document Released: 03/03/2005 Document Revised: 08/15/2015 Document Reviewed: 04/06/2015 Elsevier Interactive Patient Education  [employer].

## 2017-08-18 NOTE — Progress Notes (Signed)
Pre visit review using our clinic review tool, if applicable. No additional management support is needed unless otherwise documented below in the visit note. 

## 2017-08-18 NOTE — Progress Notes (Signed)
Chief Complaint  Patient presents with  . Establish Care   New patient with adopted daughter [name] and sig other Mac multiple complaints  1. Trouble with memory (worse x 1-2 years and short term memory issues), attention, anxiety, insomnia (sleeping 2-3 hours) falling asleep on the toilet per family and shaking at times. She reports h/o concussion and significant stress  2. H/o diarrhea x 2 months worse and having 4-5 episodes daily family reports she drinks coke soda throughout the day and dose not eat  3. She has h/o syncope and family reports blood sugar drops but she is not really eating and only drinking soda  Review of Systems  Constitutional: Negative for weight loss.  HENT: Negative for hearing loss.   Eyes: Negative for blurred vision.  Respiratory: Negative for shortness of breath.   Cardiovascular: Positive for palpitations. Negative for chest pain.  Gastrointestinal: Positive for diarrhea. Negative for blood in stool.  Musculoskeletal: Negative for falls.  Skin: Negative for rash.  Neurological: Positive for loss of consciousness. Negative for headaches.  Psychiatric/Behavioral: Positive for depression and memory loss. The patient is nervous/anxious and has insomnia.    Past Medical History:  Diagnosis Date  . (HFpEF) heart failure with preserved ejection fraction ([place])    a. Echo 2014: EF 65-70%, nl WM, mildly dilated LA, PASP nl; b. 12/2014 Echo: EF 65-70%, no rwma, mod septal hypertrophy w/o LVOT gradient or SAM; c. 07/2017 Echo: EF 55-60%, no rwma, mildly dil RV w/ nl syst fxn. Mildly dil RA. Dilated IVC w/ elevated CVP. Triv post effusion.  . Arthritis   . Asthma   . B12 deficiency   . Chronic back pain   . Chronic headaches   . Chronic pain    a. on methadone  . Coronary artery disease, non-occlusive    a. LHC 1/18: proximal to mid LAD 40% stenosed, mid LAD 30% stenosed, mid RCA 20% stenosed, distal RCA 20% stenosed, EF 55-65%, LVEDP normal  . Depression   . DJD  (degenerative joint disease), multiple sites   . Hand, foot and mouth disease 2016  . History of shingles   . Hypertension   . Iron deficiency anemia   . Long QT interval   . Methadone use ([place])    managed by Dr. Primus Bravo  . Migraine   . Obesity   . Palpitations    a. 24 hour Holter: NSR, sinus brady down to 48, occasional PVCs & couplets, 8 beats NSVT; b. 30 day event monitor 2015: NSR with rare PVC.  Marland Kitchen Psoriasis   . Syncope and collapse   . Vitamin D deficiency    Past Surgical History:  Procedure Laterality Date  . [place] SURGERY  2001  . CARDIAC CATHETERIZATION Left 04/16/2016   Procedure: Left Heart Cath and Coronary Angiography;  Surgeon: [name], MD;  Location: [facility];  Service: Cardiovascular;  Laterality: Left;  . CHOLECYSTECTOMY  2001  . GALLBLADDER SURGERY    . GASTRIC BYPASS    . GASTROPLASTY     Family History  Problem Relation Age of Onset  . Heart attack Mother   . Cancer Mother   . Heart attack Father 65       MI  . Heart attack Brother   . Heart disease Brother   . Heart attack Maternal Grandmother   . Cancer Maternal Grandmother   . Cancer Maternal Uncle    Social History   Socioeconomic History  . Marital status: Widowed  Spouse name: Not on file  . Number of children: Not on file  . Years of education: Not on file  . Highest education level: Not on file  Occupational History  . Not on file  Social Needs  . Financial resource strain: Not on file  . Food insecurity:    Worry: Not on file    Inability: Not on file  . Transportation needs:    Medical: Not on file    Non-medical: Not on file  Tobacco Use  . Smoking status: Never Smoker  . Smokeless tobacco: Never Used  Substance and Sexual Activity  . Alcohol use: No    Alcohol/week: 0.0 oz  . Drug use: No  . Sexual activity: Not on file  Lifestyle  . Physical activity:    Days per week: Not on file    Minutes per session: Not on file  . Stress: Not on file   Relationships  . Social connections:    Talks on phone: Not on file    Gets together: Not on file    Attends religious service: Not on file    Active member of club or organization: Not on file    Attends meetings of clubs or organizations: Not on file    Relationship status: Not on file  . Intimate partner violence:    Fear of current or ex partner: Not on file    Emotionally abused: Not on file    Physically abused: Not on file    Forced sexual activity: Not on file  Other Topics Concern  . Not on file  Social History Narrative   ** Merged History Encounter **       Current Meds  Medication Sig  . albuterol (PROVENTIL HFA;VENTOLIN HFA) 108 (90 BASE) MCG/ACT inhaler Inhale 2 puffs into the lungs as needed for wheezing.  . Black Cohosh 175 MG CAPS Take 1 capsule by mouth daily.  . busPIRone (BUSPAR) 7.5 MG tablet Take 7.5 mg by mouth 2 (two) times daily.  . diazepam (VALIUM) 5 MG tablet Take 5 mg by mouth.  . folic acid (FOLVITE) 1 MG tablet TAKE 1 TABLET BY MOUTH EVERY DAY  . furosemide (LASIX) 20 MG tablet Take 2 tablets (40 mg) by mouth once daily x 3 days, then take 1 tablet (20 mg) by mouth once daily  . gabapentin (NEURONTIN) 400 MG capsule Limit 2 tablets in the a.m. and midday and 3 tablets each evening    Please dispense a three-month supply  . isosorbide mononitrate (IMDUR) 30 MG 24 hr tablet Take 1 tablet (30 mg total) by mouth daily.  . methadone (DOLOPHINE) 10 MG tablet Limit 1-2 tablets by mouth 2-3 times per day if tolerated  . PARoxetine (PAXIL) 20 MG tablet Take 20 mg by mouth daily.   . potassium chloride (K-DUR) 10 MEQ tablet Take 4 tablets (40 meq) by mouth twice daily  . rOPINIRole (REQUIP) 0.25 MG tablet Take 0.5-0.75 mg by mouth at bedtime.   . rosuvastatin (CRESTOR) 10 MG tablet Take 1 tablet (10 mg total) by mouth daily.  . [DISCONTINUED] potassium chloride SA (K-DUR,KLOR-CON) 20 MEQ tablet Take 2 tablets (40 mEq total) by mouth daily.   Current  Facility-Administered Medications for the 08/18/17 encounter (Office Visit) with McLean-Scocuzza, Nino Glow, MD  Medication  . bupivacaine (PF) (MARCAINE) 0.25 % injection 30 mL  . bupivacaine (PF) (MARCAINE) 0.25 % injection 30 mL  . fentaNYL (SUBLIMAZE) injection 100 mcg  . fentaNYL (SUBLIMAZE) injection 100 mcg  .  lactated ringers infusion 1,000 mL  . lactated ringers infusion 1,000 mL  . lactated ringers infusion 1,000 mL  . lidocaine (PF) (XYLOCAINE) 1 % injection 10 mL  . midazolam (VERSED) 5 MG/5ML injection 5 mg  . orphenadrine (NORFLEX) injection 60 mg  . orphenadrine (NORFLEX) injection 60 mg  . triamcinolone acetonide (KENALOG-40) injection 40 mg  . triamcinolone acetonide (KENALOG-40) injection 40 mg   Allergies  Allergen Reactions  . Penicillins Hives, Shortness Of Breath and Rash    Has patient had a PCN reaction causing immediate rash, facial/tongue/throat swelling, SOB or lightheadedness with hypotension: Yes Has patient had a PCN reaction causing severe rash involving mucus membranes or skin necrosis: No Has patient had a PCN reaction that required hospitalization No Has patient had a PCN reaction occurring within the last 10 years: No If all of the above answers are "NO", then may proceed with Cephalosporin use.    Recent Results (from the past 2160 hour(s))  Basic metabolic panel     Status: Abnormal   Collection Time: 07/26/17  2:47 PM  Result Value Ref Range   Sodium 139 135 - 145 mmol/L   Potassium 3.3 (L) 3.5 - 5.1 mmol/L   Chloride 103 101 - 111 mmol/L   CO2 30 22 - 32 mmol/L   Glucose, Bld 90 65 - 99 mg/dL   BUN <5 (L) 6 - 20 mg/dL   Creatinine, Ser 0.77 0.44 - 1.00 mg/dL   Calcium 8.4 (L) 8.9 - 10.3 mg/dL   GFR calc non Af Amer >60 >60 mL/min   GFR calc Af Amer >60 >60 mL/min    Comment: (NOTE) The eGFR has been calculated using the CKD EPI equation. This calculation has not been validated in all clinical situations. eGFR's persistently <60 mL/min  signify possible Chronic Kidney Disease.    Anion gap 6 5 - 15    Comment: Performed at [facility], [address] [place], [place] [address]  TSH     Status: None   Collection Time: 07/26/17  2:47 PM  Result Value Ref Range   TSH 3.037 0.350 - 4.500 uIU/mL    Comment: Performed by a 3rd Generation assay with a functional sensitivity of <=0.01 uIU/mL. Performed at [facility], [address] [place], [place] [address]   CBC w/Diff     Status: Abnormal   Collection Time: 07/26/17  2:47 PM  Result Value Ref Range   WBC 5.6 3.6 - 11.0 K/uL   RBC 3.77 (L) 3.80 - 5.20 MIL/uL   Hemoglobin 11.5 (L) 12.0 - 16.0 g/dL   HCT 33.6 (L) 35.0 - 47.0 %   MCV 89.0 80.0 - 100.0 fL   MCH 30.4 26.0 - 34.0 pg   MCHC 34.2 32.0 - 36.0 g/dL   RDW 13.5 11.5 - 14.5 %   Platelets 165 150 - 440 K/uL   Neutrophils Relative % 59 %   Neutro Abs 3.3 1.4 - 6.5 K/uL   Lymphocytes Relative 29 %   Lymphs Abs 1.6 1.0 - 3.6 K/uL   Monocytes Relative 8 %   Monocytes Absolute 0.4 0.2 - 0.9 K/uL   Eosinophils Relative 3 %   Eosinophils Absolute 0.2 0 - 0.7 K/uL   Basophils Relative 1 %   Basophils Absolute 0.0 0 - 0.1 K/uL    Comment: Performed at [facility], [address] [place], [place] [address]  Ferritin     Status: None   Collection Time: 08/05/17  1:54  PM  Result Value Ref Range   Ferritin 18 11 - 307 ng/mL    Comment: Performed at [facility], [address] [place], [place] [address]  Iron and TIBC     Status: None   Collection Time: 08/05/17  1:54 PM  Result Value Ref Range   Iron 42 28 - 170 ug/dL   TIBC 345 250 - 450 ug/dL   Saturation Ratios 12 10.4 - 31.8 %   UIBC 303 ug/dL    Comment: Performed at [facility], [address] [place], [place] [address]  CBC with Differential/Platelet     Status: Abnormal   Collection Time: 08/05/17  1:54 PM  Result Value Ref Range   WBC 5.9 3.6 - 11.0 K/uL   RBC 3.88 3.80 - 5.20 MIL/uL    Hemoglobin 11.6 (L) 12.0 - 16.0 g/dL   HCT 34.3 (L) 35.0 - 47.0 %   MCV 88.5 80.0 - 100.0 fL   MCH 30.0 26.0 - 34.0 pg   MCHC 33.9 32.0 - 36.0 g/dL   RDW 13.1 11.5 - 14.5 %   Platelets 211 150 - 440 K/uL   Neutrophils Relative % 49 %   Neutro Abs 2.9 1.4 - 6.5 K/uL   Lymphocytes Relative 37 %   Lymphs Abs 2.2 1.0 - 3.6 K/uL   Monocytes Relative 9 %   Monocytes Absolute 0.5 0.2 - 0.9 K/uL   Eosinophils Relative 4 %   Eosinophils Absolute 0.2 0 - 0.7 K/uL   Basophils Relative 1 %   Basophils Absolute 0.1 0 - 0.1 K/uL    Comment: Performed at [facility], [address] [place], [place] [address]  Folate     Status: None   Collection Time: 08/05/17  1:54 PM  Result Value Ref Range   Folate 11.0 >5.9 ng/mL    Comment: Performed at [facility], [address] [place], [place] [address]  Basic metabolic panel     Status: Abnormal   Collection Time: 08/05/17  1:54 PM  Result Value Ref Range   Sodium 139 135 - 145 mmol/L   Potassium 3.3 (L) 3.5 - 5.1 mmol/L   Chloride 103 101 - 111 mmol/L   CO2 30 22 - 32 mmol/L   Glucose, Bld 75 65 - 99 mg/dL   BUN <5 (L) 6 - 20 mg/dL   Creatinine, Ser 0.77 0.44 - 1.00 mg/dL   Calcium 8.7 (L) 8.9 - 10.3 mg/dL   GFR calc non Af Amer >60 >60 mL/min   GFR calc Af Amer >60 >60 mL/min    Comment: (NOTE) The eGFR has been calculated using the CKD EPI equation. This calculation has not been validated in all clinical situations. eGFR's persistently <60 mL/min signify possible Chronic Kidney Disease.    Anion gap 6 5 - 15    Comment: Performed at [facility] [address] [place], [place] [address]   Objective  Body mass index is 37.8 kg/m. Wt Readings from Last 3 Encounters:  08/18/17 234 lb 3.2 oz (106.2 kg)  08/12/17 233 lb (105.7 kg)  08/05/17 234 lb (106.1 kg)   Temp Readings from Last 3 Encounters:  08/18/17 97.9 F (36.6 C) (Oral)  08/14/17 98.2 F (36.8 C) (Oral)  08/05/17 97.7 F (36.5 C) (Tympanic)    BP Readings from Last 3 Encounters:  08/18/17 118/78  08/14/17 111/77  08/12/17 120/70   Pulse Readings from Last 3 Encounters:  08/18/17 (!) 54  08/14/17 Marland Kitchen)  54  08/12/17 (!) 54    Physical Exam  Constitutional: She is oriented to person, place, and time. Vital signs are normal. She appears well-developed and well-nourished. She is cooperative.  HENT:  Head: Normocephalic and atraumatic.  Mouth/Throat: Oropharynx is clear and moist and mucous membranes are normal.  Eyes: Pupils are equal, round, and reactive to light. Conjunctivae are normal.  Cardiovascular: Normal rate, regular rhythm and normal heart sounds.  Pulmonary/Chest: Effort normal and breath sounds normal.  Neurological: She is alert and oriented to person, place, and time. Gait normal.  Skin: Skin is warm, dry and intact.  Psychiatric: She has a normal mood and affect. Her speech is normal and behavior is normal. Judgment and thought content normal. Cognition and memory are normal.  Nursing note and vitals reviewed.   Assessment   1. Anxiety and depression, insomnia, memory problems and attention problems ? Etiology she is on chronic pain medications which may effect memory  2. Diarrhea ? Etiology and need for colonoscopy 3. H/o syncope ? Etiology h/o CAD, NSTEMI, long QT HFpEF f/u Dr. Rockey Situ  4. HM  5. Daytime fatigue and sleepiness c/w OSA  6. H/o elevated lfts  Plan  1.  Refer to CBC psych  2. Refer to Dr. Allen Norris or [facility] prefers woman  3.  Est cardiology  Refer to neurology also to w/u #1 [facility] referred  4.  Never gets flu shot  Consider Tdap ? Had 2009/2010 and shingrix in future   Labs today check K at f/u   Pap pt has not had in a while wants to think about it if needed consider OB/GYN Colonoscopy referred today  Mammogram referred today  DEXA referred today h/o bone fractures and osteoporosis  Will need to check lipid in future   5. Refer to pulm c/w osa  6. Labs today order US abdomen    Also f/u Dr. [facility]  Former PCP Dr. Clide Deutscher  Provider: Dr. Olivia Mackie McLean-Scocuzza-Internal Medicine

## 2017-08-19 LAB — URINALYSIS, ROUTINE W REFLEX MICROSCOPIC
Bilirubin, UA: NEGATIVE
GLUCOSE, UA: NEGATIVE
KETONES UA: NEGATIVE
NITRITE UA: NEGATIVE
PROTEIN UA: NEGATIVE
Specific Gravity, UA: 1.007 (ref 1.005–1.030)
UUROB: 1 mg/dL (ref 0.2–1.0)
pH, UA: 5.5 (ref 5.0–7.5)

## 2017-08-19 LAB — MICROSCOPIC EXAMINATION: CASTS: NONE SEEN /LPF

## 2017-08-20 ENCOUNTER — Other Ambulatory Visit: Payer: Self-pay | Admitting: Internal Medicine

## 2017-08-20 ENCOUNTER — Inpatient Hospital Stay: Payer: Medicare PPO | Attending: Oncology

## 2017-08-20 ENCOUNTER — Encounter: Payer: Self-pay | Admitting: Gastroenterology

## 2017-08-20 VITALS — BP 107/72 | HR 60 | Resp 20

## 2017-08-20 DIAGNOSIS — E876 Hypokalemia: Secondary | ICD-10-CM

## 2017-08-20 DIAGNOSIS — D649 Anemia, unspecified: Secondary | ICD-10-CM | POA: Diagnosis present

## 2017-08-20 DIAGNOSIS — D508 Other iron deficiency anemias: Secondary | ICD-10-CM

## 2017-08-20 MED ORDER — SODIUM CHLORIDE 0.9 % IV SOLN
Freq: Once | INTRAVENOUS | Status: AC
  Administered 2017-08-20: 12:00:00 via INTRAVENOUS
  Filled 2017-08-20: qty 1000

## 2017-08-20 MED ORDER — IRON SUCROSE 20 MG/ML IV SOLN
200.0000 mg | Freq: Once | INTRAVENOUS | Status: AC
  Administered 2017-08-20: 200 mg via INTRAVENOUS
  Filled 2017-08-20: qty 10

## 2017-08-21 ENCOUNTER — Ambulatory Visit: Payer: Medicare PPO | Attending: Internal Medicine

## 2017-08-21 ENCOUNTER — Telehealth: Payer: Self-pay

## 2017-08-21 ENCOUNTER — Encounter: Payer: Self-pay | Admitting: Internal Medicine

## 2017-08-21 LAB — HEPATITIS B SURFACE ANTIBODY, QUANTITATIVE: Hepatitis B-Post: 6 m[IU]/mL — ABNORMAL LOW (ref >=10)

## 2017-08-21 LAB — BASIC METABOLIC PANEL WITH GFR
BUN / CREAT RATIO: 6 (calc) (ref 6–22)
BUN: 5 mg/dL — ABNORMAL LOW (ref 7–25)
CALCIUM: 9.2 mg/dL (ref 8.6–10.2)
CO2: 26 mmol/L (ref 20–32)
Chloride: 106 mmol/L (ref 98–110)
Creat: 0.86 mg/dL (ref 0.50–1.10)
GFR, EST AFRICAN AMERICAN: 92 mL/min/{1.73_m2} (ref >=60)
GFR, EST NON AFRICAN AMERICAN: 79 mL/min/{1.73_m2} (ref >=60)
Glucose, Bld: 108 mg/dL — ABNORMAL HIGH (ref 65–99)
Potassium: 3.6 mmol/L (ref 3.5–5.3)
SODIUM: 146 mmol/L (ref 135–146)

## 2017-08-21 LAB — HEPATITIS B SURFACE ANTIGEN: Hepatitis B Surface Ag: NONREACTIVE

## 2017-08-21 LAB — HEPATITIS C ANTIBODY
Hepatitis C Ab: NONREACTIVE
SIGNAL TO CUT-OFF: 0.14 (ref <1.00)

## 2017-08-21 LAB — TEST AUTHORIZATION 2

## 2017-08-21 NOTE — Addendum Note (Signed)
Addended by: Warden FillersWRIGHT, [name] S on: 08/21/2017 08:25 AM   Modules accepted: Orders

## 2017-08-21 NOTE — Telephone Encounter (Signed)
Copied from CRM [phone]#[id number]. Topic: General - Other >> Aug 21, 2017 10:29 AM Leafy Roobinson, Norma J wrote: Reason for CRM: julie from [facility] is calling the patient did not show up for her ultrasound today. If patient would like to rsc patient can call [phone]

## 2017-08-21 NOTE — Telephone Encounter (Signed)
FYI

## 2017-08-21 NOTE — Telephone Encounter (Signed)
Call pt and give info to resch

## 2017-08-24 ENCOUNTER — Encounter: Payer: Self-pay | Admitting: Gastroenterology

## 2017-08-25 ENCOUNTER — Other Ambulatory Visit: Payer: Self-pay

## 2017-08-25 ENCOUNTER — Ambulatory Visit: Payer: Medicare PPO | Admitting: Gastroenterology

## 2017-08-25 ENCOUNTER — Other Ambulatory Visit: Payer: Self-pay | Admitting: Oncology

## 2017-08-25 ENCOUNTER — Encounter: Payer: Self-pay | Admitting: Gastroenterology

## 2017-08-25 VITALS — BP 121/75 | HR 61 | Temp 97.8°F | Ht 66.0 in | Wt 234.6 lb

## 2017-08-25 DIAGNOSIS — R1310 Dysphagia, unspecified: Secondary | ICD-10-CM

## 2017-08-25 DIAGNOSIS — D508 Other iron deficiency anemias: Secondary | ICD-10-CM | POA: Diagnosis not present

## 2017-08-25 DIAGNOSIS — R748 Abnormal levels of other serum enzymes: Secondary | ICD-10-CM | POA: Diagnosis not present

## 2017-08-25 DIAGNOSIS — Z1211 Encounter for screening for malignant neoplasm of colon: Secondary | ICD-10-CM

## 2017-08-25 DIAGNOSIS — R1319 Other dysphagia: Secondary | ICD-10-CM

## 2017-08-25 NOTE — Patient Instructions (Signed)
F/U 3 months Call # [phone] to schedule your ultrasound.

## 2017-08-25 NOTE — Addendum Note (Signed)
Addended by: Jackquline DenmarkIDGEWAY, Cordon Gassett W on: 08/25/2017 01:46 PM   Modules accepted: Orders

## 2017-08-25 NOTE — Addendum Note (Signed)
Addended by: Jackquline DenmarkIDGEWAY, Maren Wiesen W on: 08/25/2017 01:51 PM   Modules accepted: Orders

## 2017-08-25 NOTE — Progress Notes (Signed)
Cheyenne Gray [address]  [place]  [place], [place] [address]  Main: [phone]  Fax: [phone]   Gastroenterology Consultation  Referring Provider:     McLean-Scocuzza, Olivia Mackie * Primary Care Physician:  McLean-Scocuzza, Nino Glow, MD Primary Gastroenterologist:  Dr. Vonda Gray Reason for Consultation:     Elevated liver enzymes        HPI:    Chief Complaint  Patient presents with  . Establish Care    referral from Orland Mustard, MD for elevated LFT's, diarrhea. Pt states BM's vary from diarrhea-constipation, no n/v, probable ext. hemorroid.     Cheyenne Gray is a 52 y.o. y/o female referred for consultation & management  by Dr. Terese Door, Nino Glow, MD.  Patient with history of gastric bypass surgery 15-16 years ago for weight loss.  Denies any history of cirrhosis.  Denies any alcohol use.  Mildly elevated AST noted on previous labs, with most recent hepatic function panel showing normal liver enzymes.  Denies any herbal products, weight loss products, or green tea.  Denies any confusion, episodes of bleeding.  No weight loss no nausea or vomiting, no heartburn.  Patient reports dysphagia with solids and liquids, ongoing for the last 1 to 2 months.  States she chews her food well, but needs to have her dentures changed.  Reports an ER visit last year due to food impaction.  ER notes reviewed, did reveal the patient was given glucagon, and felt better, and was discharged from the ER.  She had an EGD in 2015 due to melena, anemia and abdominal pain.  This was done by Cheyenne Gray.  Stable and ulceration was noted at the gastro jejunal anastomosis.  Patient was given sucralfate.  Stomach biopsies did not reveal H. Pylori.  Reports history of colonoscopy prior to her gastric bypass 15 or 16 years ago as well.  No recent colonoscopies.  No immediate family history of colon cancer.  Reports alternating constipation and diarrhea.  States has some  days with hard balls of stool, and others with loose stools.  Past Medical History:  Diagnosis Date  . (HFpEF) heart failure with preserved ejection fraction ([name])    a. Echo 2014: EF 65-70%, nl WM, mildly dilated LA, PASP nl; b. 12/2014 Echo: EF 65-70%, no rwma, mod septal hypertrophy w/o LVOT gradient or SAM; c. 07/2017 Echo: EF 55-60%, no rwma, mildly dil RV w/ nl syst fxn. Mildly dil RA. Dilated IVC w/ elevated CVP. Triv post effusion.  . (HFpEF) heart failure with preserved ejection fraction ([name])   . Anxiety   . Arthritis   . Asthma   . B12 deficiency   . Chronic back pain   . Chronic headaches   . Chronic pain    a. on methadone  . Coronary artery disease, non-occlusive    a. LHC 1/18: proximal to mid LAD 40% stenosed, mid LAD 30% stenosed, mid RCA 20% stenosed, distal RCA 20% stenosed, EF 55-65%, LVEDP normal  . Depression   . DJD (degenerative joint disease), multiple sites   . Frequent headaches   . Gallstone   . H/O non-ST elevation myocardial infarction (NSTEMI)   . Hand, foot and mouth disease 2016  . History of shingles   . Hypertension   . Iron deficiency anemia   . Long QT interval   . Long QT interval   . Methadone use ([name])    managed by Dr. Primus Bravo  . Migraine   . Obesity   .  Palpitations    a. 24 hour Holter: NSR, sinus brady down to 48, occasional PVCs & couplets, 8 beats NSVT; b. 30 day event monitor 2015: NSR with rare PVC.  Marland Kitchen Psoriasis   . Syncope and collapse   . Vitamin D deficiency     Past Surgical History:  Procedure Laterality Date  . ABDOMINOPLASTY     tummy tuck ? year   . [place] SURGERY  2001  . CARDIAC CATHETERIZATION Left 04/16/2016   Procedure: Left Heart Cath and Coronary Angiography;  Surgeon: [name], MD;  Location: [facility];  Service: Cardiovascular;  Laterality: Left;  . CHOLECYSTECTOMY  2001  . GALLBLADDER SURGERY    . GASTRIC BYPASS    . GASTROPLASTY      Prior to Admission medications   Medication Sig  Start Date End Date Taking? Authorizing Provider  albuterol (PROVENTIL HFA;VENTOLIN HFA) 108 (90 BASE) MCG/ACT inhaler Inhale 2 puffs into the lungs as needed for wheezing.   Yes [provider]  Black Cohosh 175 MG CAPS Take 1 capsule by mouth daily.   Yes [provider]  busPIRone (BUSPAR) 7.5 MG tablet Take 7.5 mg by mouth 2 (two) times daily.   Yes [provider]  Cholecalciferol 50000 units capsule Take 1 capsule (50,000 Units total) by mouth once a week. 08/18/17  Yes McLean-Scocuzza, Nino Glow, MD  diazepam (VALIUM) 5 MG tablet Take 5 mg by mouth. 07/19/17  Yes [provider]  folic acid (FOLVITE) 1 MG tablet TAKE 1 TABLET BY MOUTH EVERY DAY 07/27/17  Yes Earlie Server, MD  furosemide (LASIX) 20 MG tablet Take 2 tablets (40 mg) by mouth once daily x 3 days, then take 1 tablet (20 mg) by mouth once daily 07/22/17  Yes Theora Gianotti, NP  gabapentin (NEURONTIN) 400 MG capsule Limit 2 tablets in the a.m. and midday and 3 tablets each evening    Please dispense a three-month supply 11/15/15  Yes Mohammed Kindle, MD  isosorbide mononitrate (IMDUR) 30 MG 24 hr tablet Take 1 tablet (30 mg total) by mouth daily. 04/03/17  Yes [name], MD  methadone (DOLOPHINE) 10 MG tablet Limit 1-2 tablets by mouth 2-3 times per day if tolerated 11/15/15  Yes Mohammed Kindle, MD  PARoxetine (PAXIL) 20 MG tablet Take 20 mg by mouth daily.    Yes [provider]  potassium chloride (K-DUR) 10 MEQ tablet Take 4 tablets (40 meq) by mouth twice daily 08/12/17  Yes Theora Gianotti, NP  rOPINIRole (REQUIP) 0.25 MG tablet Take 0.5-0.75 mg by mouth at bedtime.    Yes [provider]  rosuvastatin (CRESTOR) 10 MG tablet Take 1 tablet (10 mg total) by mouth daily. 04/03/17  Yes Gollan, Kathlene November, MD  nitroGLYCERIN (NITROSTAT) 0.4 MG SL tablet Place 1 tablet (0.4 mg total) under the tongue every 5 (five) minutes as needed for chest pain. 12/07/15 08/12/17  [name], MD    Family History  Problem Relation Age of Onset  . Heart attack Mother   . Cancer Mother        pancreatitic   . Early death Mother   . Heart attack Father 24       MI  . Early death Father   . Heart disease Father   . Heart attack Brother   . Heart disease Brother   . Arthritis Brother   . Depression Brother   . Diabetes Brother   . Heart attack Maternal  Grandmother   . Cancer Maternal Grandmother        pancreatitic   . Heart disease Maternal Grandmother   . Cancer Maternal Uncle        pancreatitic   . Cancer Paternal Grandmother        ? type   . Diabetes Paternal Grandmother   . Cancer Maternal Uncle        pancreatitic      Social History   Tobacco Use  . Smoking status: Never Smoker  . Smokeless tobacco: Never Used  Substance Use Topics  . Alcohol use: No    Alcohol/week: 0.0 oz  . Drug use: No    Allergies as of 08/25/2017 - Review Complete 08/25/2017  Allergen Reaction Noted  . Penicillins Hives, Shortness Of Breath, and Rash 07/20/2009    Review of Systems:    All systems reviewed and negative except where noted in HPI.   Physical Exam:  BP 121/75   Pulse 61   Temp 97.8 F (36.6 C) (Oral)   Ht 5' 6"  (1.676 m)   Wt 234 lb 9.6 oz (106.4 kg)   BMI 37.87 kg/m  No LMP recorded. Patient is perimenopausal. Psych:  Alert and cooperative. Normal mood and affect. General:   Alert,  Well-developed, well-nourished, pleasant and cooperative in NAD Head:  Normocephalic and atraumatic. Eyes:  Sclera clear, no icterus.   Conjunctiva pink. Ears:  Normal auditory acuity. Nose:  No deformity, discharge, or lesions. Mouth:  No deformity or lesions,oropharynx pink & moist. Neck:  Supple; no masses or thyromegaly. Lungs:  Respirations even and unlabored.  Clear throughout to auscultation.   No wheezes, crackles, or rhonchi. No acute distress. Heart:  Regular rate and rhythm; no murmurs, clicks, rubs, or gallops. Abdomen:  Normal bowel sounds.   No bruits.  Soft, non-tender and non-distended without masses, hepatosplenomegaly or hernias noted.  No guarding or rebound tenderness.    Msk:  Symmetrical without gross deformities. Good, equal movement & strength bilaterally. Pulses:  Normal pulses noted. Extremities:  No clubbing or edema.  No cyanosis. Neurologic:  Alert and oriented x3;  grossly normal neurologically. Skin:  Intact without significant lesions or rashes. No jaundice. Lymph Nodes:  No significant cervical adenopathy. Psych:  Alert and cooperative. Normal mood and affect.   Labs: CBC    Component Value Date/Time   WBC 5.9 08/05/2017 1354   RBC 3.88 08/05/2017 1354   HGB 11.6 (L) 08/05/2017 1354   HGB 12.8 03/28/2016 1519   HCT 34.3 (L) 08/05/2017 1354   HCT 37.8 03/28/2016 1519   PLT 211 08/05/2017 1354   PLT 272 03/28/2016 1519   MCV 88.5 08/05/2017 1354   MCV 88 03/28/2016 1519   MCV 91 01/02/2014 1156   MCH 30.0 08/05/2017 1354   MCHC 33.9 08/05/2017 1354   RDW 13.1 08/05/2017 1354   RDW 13.7 03/28/2016 1519   RDW 12.8 01/02/2014 1156   LYMPHSABS 2.2 08/05/2017 1354   LYMPHSABS 2.9 03/28/2016 1519   LYMPHSABS 2.3 01/02/2014 1156   MONOABS 0.5 08/05/2017 1354   MONOABS 0.5 01/02/2014 1156   EOSABS 0.2 08/05/2017 1354   EOSABS 0.4 03/28/2016 1519   EOSABS 0.2 01/02/2014 1156   BASOSABS 0.1 08/05/2017 1354   BASOSABS 0.0 03/28/2016 1519   BASOSABS 0.1 01/02/2014 1156   CMP     Component Value Date/Time   NA 146 08/18/2017 0954   NA 142 03/28/2016 1519   K 3.6 08/18/2017 0954   CL 106 08/18/2017  0954   CO2 26 08/18/2017 0954   GLUCOSE 108 (H) 08/18/2017 0954   BUN 5 (L) 08/18/2017 0954   BUN 5 (L) 03/28/2016 1519   CREATININE 0.86 08/18/2017 0954   CALCIUM 9.2 08/18/2017 0954   PROT 6.7 08/18/2017 0954   PROT 6.6 10/02/2016 1037   PROT 6.4 05/04/2013 1556   ALBUMIN 3.7 08/18/2017 0954   ALBUMIN 3.7 10/02/2016 1037   ALBUMIN 3.1 (L) 05/04/2013 1556   AST 20 08/18/2017 0954   AST 38 (H)  05/04/2013 1556   ALT 11 08/18/2017 0954   ALT 39 05/04/2013 1556   ALKPHOS 99 08/18/2017 0954   ALKPHOS 84 05/04/2013 1556   BILITOT 0.4 08/18/2017 0954   BILITOT 0.3 10/02/2016 1037   BILITOT 0.3 05/04/2013 1556   GFRNONAA 79 08/18/2017 0954   GFRAA 92 08/18/2017 0954    Imaging Studies: No results found.  Assessment and Plan:   ARLYN BUMPUS is a 52 y.o. y/o female has been referred for elevated liver enzymes  AST noted to be mildly elevated chronically, but liver enzymes normal last week Likely due to fatty liver Normal total bilirubin, alk phos  Patient is being seen by hematology for iron deficiency anemia as well.  Therefore ferritin is low normal.  Iron deficiency anemia is likely due to her history of gastric bypass.  She is receiving IV iron.  To complete work-up: Hep C antibody, hep B surface antigen and hep B surface antibody were negative. We will check hep A total antibody, antimitochondrial and anti-smooth muscle antibody, and ceruloplasmin.  Serum copper was normal.  Patient asked to avoid hepatotoxic drugs, and encouraged to lose weight and follow a healthy diet We will also obtain right upper quadrant ultrasound to evaluate the liver  Colonoscopy indicated for both screening, and history of iron deficiency anemia EGD indicated for iron deficiency anemia and dysphagia  Patient asked to get her dentures change, and see her at her dentist for this soon.  Asked to chew food well followed by liquids.  Her diarrhea is likely postobstructive, as she reports constipation as well.  Asked to start taking Metamucil daily to help bulk loose stool, and help with constipation.  Drinks a lot of soda, and was asked to cut this down as well. Asked to eat a healthy regular diet, as she skips meals and drinks soda instead.  I have discussed alternative options, risks & benefits,  which include, but are not limited to, bleeding, infection, perforation,respiratory  complication & drug reaction.  The patient agrees with this plan & written consent will be obtained.     Dr Cheyenne Gray

## 2017-08-26 ENCOUNTER — Ambulatory Visit (INDEPENDENT_AMBULATORY_CARE_PROVIDER_SITE_OTHER): Payer: Medicare PPO | Admitting: Internal Medicine

## 2017-08-26 ENCOUNTER — Encounter: Payer: Self-pay | Admitting: Internal Medicine

## 2017-08-26 VITALS — BP 140/80 | HR 72 | Ht 66.0 in | Wt 235.0 lb

## 2017-08-26 DIAGNOSIS — G4719 Other hypersomnia: Secondary | ICD-10-CM

## 2017-08-26 NOTE — Patient Instructions (Addendum)

## 2017-08-26 NOTE — Progress Notes (Signed)
[facility]      Assessment and Plan:  Excessive daytime sleepiness -Symptoms and signs of obstructive sleep apnea -We will send for sleep apnea study, start on CPAP as indicated.  Essential hypertension, palpitations, anxiety, diastolic heart failure. -Above conditions can be contributed to by obstructive sleep apnea, therefore treatment of sleep apnea is important part of their management.  Orders Placed This Encounter  Procedures  . Split night study   Return in about 3 months (around 11/26/2017).   Date: 08/26/2017  MRN# [id number] Cheyenne DillingJodie M Niebla 07/07/1965  Referring Physician: Dr. Judie GrieveMcLean-Scocuzza  Cheyenne Viann FishM Hepp is a 52 y.o. old female seen in consultation for chief complaint of:    Chief Complaint  Patient presents with  . Consult    sleep issues: daytime tiredness: snoring: gasp for air    HPI:  Patient is here with her friend who notes that she stops breathing at night. She falls asleep in the living romo around 10:30, wakes at MN and goes to bed. Then she sleep for 2 hours is woken up by leg pain of uncertain etiology.  She has never been diagnosed with OSA in the past. She is sleepy during the day.    PMHX:   Past Medical History:  Diagnosis Date  . (HFpEF) heart failure with preserved ejection fraction ([facility])    a. Echo 2014: EF 65-70%, nl WM, mildly dilated LA, PASP nl; b. 12/2014 Echo: EF 65-70%, no rwma, mod septal hypertrophy w/o LVOT gradient or SAM; c. 07/2017 Echo: EF 55-60%, no rwma, mildly dil RV w/ nl syst fxn. Mildly dil RA. Dilated IVC w/ elevated CVP. Triv post effusion.  . (HFpEF) heart failure with preserved ejection fraction ([facility])   . Anxiety   . Arthritis   . Asthma   . B12 deficiency   . Chronic back pain   . Chronic headaches   . Chronic pain    a. on methadone  . Coronary artery disease, non-occlusive    a. LHC 1/18: proximal to mid LAD 40% stenosed, mid LAD 30% stenosed, mid RCA 20% stenosed, distal  RCA 20% stenosed, EF 55-65%, LVEDP normal  . Depression   . DJD (degenerative joint disease), multiple sites   . Frequent headaches   . Gallstone   . H/O non-ST elevation myocardial infarction (NSTEMI)   . Hand, foot and mouth disease 2016  . History of shingles   . Hypertension   . Iron deficiency anemia   . Long QT interval   . Long QT interval   . Methadone use ([facility])    managed by Dr. Metta Clinesrisp  . Migraine   . Obesity   . Palpitations    a. 24 hour Holter: NSR, sinus brady down to 48, occasional PVCs & couplets, 8 beats NSVT; b. 30 day event monitor 2015: NSR with rare PVC.  Marland Kitchen. Psoriasis   . Syncope and collapse   . Vitamin D deficiency    Surgical Hx:  Past Surgical History:  Procedure Laterality Date  . ABDOMINOPLASTY     tummy tuck ? year   . BARIATRIC SURGERY  2001  . CARDIAC CATHETERIZATION Left 04/16/2016   Procedure: Left Heart Cath and Coronary Angiography;  Surgeon: Antonieta Ibaimothy J Gollan, MD;  Location: [facility];  Service: Cardiovascular;  Laterality: Left;  . CHOLECYSTECTOMY  2001  . GALLBLADDER SURGERY    . GASTRIC BYPASS    . GASTROPLASTY     Family Hx:  Family History  Problem  Relation Age of Onset  . Heart attack Mother   . Cancer Mother        pancreatitic   . Early death Mother   . Heart attack Father 82       MI  . Early death Father   . Heart disease Father   . Heart attack Brother   . Heart disease Brother   . Arthritis Brother   . Depression Brother   . Diabetes Brother   . Heart attack Maternal Grandmother   . Cancer Maternal Grandmother        pancreatitic   . Heart disease Maternal Grandmother   . Cancer Maternal Uncle        pancreatitic   . Cancer Paternal Grandmother        ? type   . Diabetes Paternal Grandmother   . Cancer Maternal Uncle        pancreatitic    Social Hx:   Social History   Tobacco Use  . Smoking status: Never Smoker  . Smokeless tobacco: Never Used  Substance Use Topics  . Alcohol use: No     Alcohol/week: 0.0 oz  . Drug use: No   Medication:    Current Outpatient Medications:  .  albuterol (PROVENTIL HFA;VENTOLIN HFA) 108 (90 BASE) MCG/ACT inhaler, Inhale 2 puffs into the lungs as needed for wheezing., Disp: , Rfl:  .  Black Cohosh 175 MG CAPS, Take 1 capsule by mouth daily., Disp: , Rfl:  .  busPIRone (BUSPAR) 7.5 MG tablet, Take 7.5 mg by mouth 2 (two) times daily., Disp: , Rfl:  .  Cholecalciferol 50000 units capsule, Take 1 capsule (50,000 Units total) by mouth once a week., Disp: 13 capsule, Rfl: 1 .  folic acid (FOLVITE) 1 MG tablet, TAKE 1 TABLET BY MOUTH EVERY DAY, Disp: 30 tablet, Rfl: 0 .  furosemide (LASIX) 20 MG tablet, Take 2 tablets (40 mg) by mouth once daily x 3 days, then take 1 tablet (20 mg) by mouth once daily, Disp: 35 tablet, Rfl: 2 .  gabapentin (NEURONTIN) 400 MG capsule, Limit 2 tablets in the a.m. and midday and 3 tablets each evening    Please dispense a three-month supply, Disp: 630 capsule, Rfl: 0 .  isosorbide mononitrate (IMDUR) 30 MG 24 hr tablet, Take 1 tablet (30 mg total) by mouth daily., Disp: 90 tablet, Rfl: 4 .  methadone (DOLOPHINE) 10 MG tablet, Limit 1-2 tablets by mouth 2-3 times per day if tolerated, Disp: 180 tablet, Rfl: 0 .  PARoxetine (PAXIL) 20 MG tablet, Take 20 mg by mouth daily. , Disp: , Rfl:  .  potassium chloride (K-DUR) 10 MEQ tablet, Take 4 tablets (40 meq) by mouth twice daily, Disp: 720 tablet, Rfl: 3 .  rOPINIRole (REQUIP) 0.25 MG tablet, Take 0.5-0.75 mg by mouth at bedtime. , Disp: , Rfl:  .  rosuvastatin (CRESTOR) 10 MG tablet, Take 1 tablet (10 mg total) by mouth daily., Disp: 90 tablet, Rfl: 4 .  nitroGLYCERIN (NITROSTAT) 0.4 MG SL tablet, Place 1 tablet (0.4 mg total) under the tongue every 5 (five) minutes as needed for chest pain., Disp: 25 tablet, Rfl: 3  Current Facility-Administered Medications:  .  bupivacaine (PF) (MARCAINE) 0.25 % injection 30 mL, 30 mL, Other, Once, Ewing Schlein, MD .  bupivacaine (PF)  (MARCAINE) 0.25 % injection 30 mL, 30 mL, Other, Once, Ewing Schlein, MD .  fentaNYL (SUBLIMAZE) injection 100 mcg, 100 mcg, Intravenous, Once, Ewing Schlein, MD .  fentaNYL (SUBLIMAZE)  injection 100 mcg, 100 mcg, Intravenous, Once, Ewing Schlein, MD .  lactated ringers infusion 1,000 mL, 1,000 mL, Intravenous, Continuous, Ewing Schlein, MD .  lactated ringers infusion 1,000 mL, 1,000 mL, Intravenous, Continuous, Ewing Schlein, MD .  lactated ringers infusion 1,000 mL, 1,000 mL, Intravenous, Continuous, Ewing Schlein, MD, Last Rate: 125 mL/hr at 08/06/15 1002, 1,000 mL at 08/06/15 1002 .  lidocaine (PF) (XYLOCAINE) 1 % injection 10 mL, 10 mL, Subcutaneous, Once, Ewing Schlein, MD .  midazolam (VERSED) 5 MG/5ML injection 5 mg, 5 mg, Intravenous, Once, Ewing Schlein, MD .  orphenadrine (NORFLEX) injection 60 mg, 60 mg, Intramuscular, Once, Ewing Schlein, MD .  orphenadrine (NORFLEX) injection 60 mg, 60 mg, Intramuscular, Once, Ewing Schlein, MD .  triamcinolone acetonide (KENALOG-40) injection 40 mg, 40 mg, Other, Once, Ewing Schlein, MD .  triamcinolone acetonide (KENALOG-40) injection 40 mg, 40 mg, Other, Once, Ewing Schlein, MD   Allergies:  Penicillins  Review of Systems: Gen:  Denies  fever, sweats, chills HEENT: Denies blurred vision, double vision. bleeds, sore throat Cvc:  No dizziness, chest pain. Resp:   Denies cough or sputum production, shortness of breath Gi: Denies swallowing difficulty, stomach pain. Gu:  Denies bladder incontinence, burning urine Ext:   No Joint pain, stiffness. Skin: No skin rash,  hives  Endoc:  No polyuria, polydipsia. Psych: No depression, insomnia. Other:  All other systems were reviewed with the patient and were negative other that what is mentioned in the HPI.   Physical Examination:   VS: BP 140/80 (BP Location: Left Arm, Cuff Size: Normal)   Pulse 72   Ht 5\' 6"  (1.676 m)   Wt 235 lb (106.6 kg)   SpO2 98%   BMI 37.93 kg/m     General Appearance: No distress  Neuro:without focal findings,  speech normal,  HEENT: PERRLA, EOM intact.   Pulmonary: normal breath sounds, No wheezing.  CardiovascularNormal S1,S2.  No m/r/g.   Abdomen: Benign, Soft, non-tender. Renal:  No costovertebral tenderness  GU:  No performed at this time. Endoc: No evident thyromegaly, no signs of acromegaly. Skin:   warm, no rashes, no ecchymosis  Extremities: normal, no cyanosis, clubbing.  Other findings:    LABORATORY PANEL:   CBC No results for input(s): WBC, HGB, HCT, PLT in the last 168 hours. ------------------------------------------------------------------------------------------------------------------  Chemistries  No results for input(s): NA, K, CL, CO2, GLUCOSE, BUN, CREATININE, CALCIUM, MG, AST, ALT, ALKPHOS, BILITOT in the last 168 hours.  Invalid input(s): GFRCGP ------------------------------------------------------------------------------------------------------------------  Cardiac Enzymes No results for input(s): TROPONINI in the last 168 hours. ------------------------------------------------------------  RADIOLOGY:  No results found.     Thank  you for the consultation and for allowing [facility] to assist in the care of your patient. Our recommendations are noted above.  Please contact us if we can be of further service.   Wells Guiles, MD.  Board Certified in Internal Medicine, Pulmonary Medicine, Critical Care Medicine, and Sleep Medicine.  [facility] Office Number: [phone]  Santiago Glad, M.D.  Billy Fischer, M.D  08/26/2017

## 2017-08-26 NOTE — Progress Notes (Signed)
Pharmacy is faxing what patient has been taking and picking up for the last six months.

## 2017-08-27 ENCOUNTER — Encounter: Payer: Self-pay | Admitting: Internal Medicine

## 2017-08-27 ENCOUNTER — Inpatient Hospital Stay: Payer: Medicare PPO

## 2017-08-27 VITALS — BP 130/80 | HR 60 | Temp 97.8°F | Resp 18

## 2017-08-27 DIAGNOSIS — D649 Anemia, unspecified: Secondary | ICD-10-CM | POA: Diagnosis not present

## 2017-08-27 DIAGNOSIS — D508 Other iron deficiency anemias: Secondary | ICD-10-CM

## 2017-08-27 MED ORDER — SODIUM CHLORIDE 0.9 % IV SOLN
Freq: Once | INTRAVENOUS | Status: AC
  Administered 2017-08-27: 12:00:00 via INTRAVENOUS
  Filled 2017-08-27: qty 1000

## 2017-08-27 MED ORDER — IRON SUCROSE 20 MG/ML IV SOLN
200.0000 mg | Freq: Once | INTRAVENOUS | Status: AC
  Administered 2017-08-27: 200 mg via INTRAVENOUS
  Filled 2017-08-27: qty 10

## 2017-09-03 ENCOUNTER — Inpatient Hospital Stay: Payer: Medicare PPO

## 2017-09-03 VITALS — BP 108/72 | HR 90 | Temp 97.0°F | Resp 18

## 2017-09-03 DIAGNOSIS — D508 Other iron deficiency anemias: Secondary | ICD-10-CM

## 2017-09-03 DIAGNOSIS — D649 Anemia, unspecified: Secondary | ICD-10-CM | POA: Diagnosis not present

## 2017-09-03 MED ORDER — IRON SUCROSE 20 MG/ML IV SOLN
200.0000 mg | Freq: Once | INTRAVENOUS | Status: AC
  Administered 2017-09-03: 200 mg via INTRAVENOUS
  Filled 2017-09-03: qty 10

## 2017-09-03 MED ORDER — SODIUM CHLORIDE 0.9 % IV SOLN
Freq: Once | INTRAVENOUS | Status: AC
  Administered 2017-09-03: 12:00:00 via INTRAVENOUS
  Filled 2017-09-03: qty 1000

## 2017-09-08 ENCOUNTER — Telehealth: Payer: Self-pay | Admitting: Internal Medicine

## 2017-09-08 NOTE — Telephone Encounter (Signed)
Patient wants to cancel sleep study this week and do a home sleep test instead please call.

## 2017-09-09 ENCOUNTER — Other Ambulatory Visit: Payer: Self-pay | Admitting: Internal Medicine

## 2017-09-09 DIAGNOSIS — G4719 Other hypersomnia: Secondary | ICD-10-CM

## 2017-09-09 NOTE — Telephone Encounter (Signed)
ATC patient. No answer. LMOVM for pt to return my call in regards to in lab study. [name]

## 2017-09-11 ENCOUNTER — Ambulatory Visit: Admission: RE | Admit: 2017-09-11 | Payer: Medicare PPO | Source: Ambulatory Visit | Admitting: Gastroenterology

## 2017-09-11 ENCOUNTER — Encounter: Admission: RE | Payer: Self-pay | Source: Ambulatory Visit

## 2017-09-11 SURGERY — COLONOSCOPY WITH PROPOFOL
Anesthesia: General

## 2017-09-11 NOTE — Telephone Encounter (Signed)
LMOVM for pt to return call to schedule HST. Pt's insurance does not require PA.  [name]

## 2017-09-15 NOTE — Telephone Encounter (Signed)
Contacted patient and scheduled HST for Monday 09/28/17 to pick up device between 3:30 - 4:30 pm.  Pt voiced understanding and nothing else needed at this time.  [name]

## 2017-09-23 ENCOUNTER — Ambulatory Visit: Payer: Medicare PPO | Admitting: Gastroenterology

## 2017-09-28 ENCOUNTER — Encounter: Payer: Self-pay | Admitting: Internal Medicine

## 2017-09-28 ENCOUNTER — Ambulatory Visit (INDEPENDENT_AMBULATORY_CARE_PROVIDER_SITE_OTHER): Payer: Medicare PPO | Admitting: Internal Medicine

## 2017-09-28 VITALS — BP 124/74 | HR 63 | Temp 98.2°F | Resp 16 | Ht 66.0 in | Wt 224.5 lb

## 2017-09-28 DIAGNOSIS — M7050 Other bursitis of knee, unspecified knee: Secondary | ICD-10-CM

## 2017-09-28 DIAGNOSIS — F329 Major depressive disorder, single episode, unspecified: Secondary | ICD-10-CM

## 2017-09-28 DIAGNOSIS — F32A Depression, unspecified: Secondary | ICD-10-CM | POA: Insufficient documentation

## 2017-09-28 DIAGNOSIS — G4700 Insomnia, unspecified: Secondary | ICD-10-CM | POA: Diagnosis not present

## 2017-09-28 DIAGNOSIS — E559 Vitamin D deficiency, unspecified: Secondary | ICD-10-CM | POA: Diagnosis not present

## 2017-09-28 DIAGNOSIS — R609 Edema, unspecified: Secondary | ICD-10-CM

## 2017-09-28 DIAGNOSIS — F419 Anxiety disorder, unspecified: Secondary | ICD-10-CM | POA: Diagnosis not present

## 2017-09-28 MED ORDER — FUROSEMIDE 40 MG PO TABS: 40.0000 mg | ORAL_TABLET | Freq: Every day | ORAL | 1 refills | Status: DC

## 2017-09-28 MED ORDER — DULOXETINE HCL 30 MG PO CPEP
30.0000 mg | ORAL_CAPSULE | Freq: Every day | ORAL | 5 refills | Status: DC
Start: 2017-09-28 — End: 2017-10-26

## 2017-09-28 NOTE — Patient Instructions (Addendum)
Consider hepatitis B vaccine  Stop Paxil  Start Cymbalta 30 mg daily in the am  Consider female therapist   Anserine bursitis I believe you have with lower leg swelling   F/u in 2-3 months   Take lasix 40 mg daily    Hepatitis B Vaccine, Recombinant injection What is this medicine? HEPATITIS B VACCINE (hep uh TAHY tis B VAK seen) is a vaccine. It is used to prevent an infection with the hepatitis B virus. This medicine may be used for other purposes; ask your health care provider or pharmacist if you have questions. COMMON BRAND NAME(S): Engerix-B, Recombivax HB What should I tell my health care provider before I take this medicine? They need to know if you have any of these conditions: -fever, infection -heart disease -hepatitis B infection -immune system problems -kidney disease -an unusual or allergic reaction to vaccines, yeast, other medicines, foods, dyes, or preservatives -pregnant or trying to get pregnant -breast-feeding How should I use this medicine? This vaccine is for injection into a muscle. It is given by a health care professional. A copy of Vaccine Information Statements will be given before each vaccination. Read this sheet carefully each time. The sheet may change frequently. Talk to your pediatrician regarding the use of this medicine in children. While this drug may be prescribed for children as young as newborn for selected conditions, precautions do apply. Overdosage: If you think you have taken too much of this medicine contact a poison control center or emergency room at once. NOTE: This medicine is only for you. Do not share this medicine with others. What if I miss a dose? It is important not to miss your dose. Call your doctor or health care professional if you are unable to keep an appointment. What may interact with this medicine? -medicines that suppress your immune function like adalimumab, anakinra, infliximab -medicines to treat cancer -steroid  medicines like prednisone or cortisone This list may not describe all possible interactions. Give your health care provider a list of all the medicines, herbs, non-prescription drugs, or dietary supplements you use. Also tell them if you smoke, drink alcohol, or use illegal drugs. Some items may interact with your medicine. What should I watch for while using this medicine? See your health care provider for all shots of this vaccine as directed. You must have 3 shots of this vaccine for protection from hepatitis B infection. Tell your doctor right away if you have any serious or unusual side effects after getting this vaccine. What side effects may I notice from receiving this medicine? Side effects that you should report to your doctor or health care professional as soon as possible: -allergic reactions like skin rash, itching or hives, swelling of the face, lips, or tongue -breathing problems -confused, irritated -fast, irregular heartbeat -flu-like syndrome -numb, tingling pain -seizures -unusually weak or tired Side effects that usually do not require medical attention (report to your doctor or health care professional if they continue or are bothersome): -diarrhea -fever -headache -loss of appetite -muscle pain -nausea -pain, redness, swelling, or irritation at site where injected -tiredness This list may not describe all possible side effects. Call your doctor for medical advice about side effects. You may report side effects to FDA at 1-800-FDA-1088. Where should I keep my medicine? This drug is given in a hospital or clinic and will not be stored at home. NOTE: This sheet is a summary. It may not cover all possible information. If you have questions about this  medicine, talk to your doctor, pharmacist, or health care provider.  2018 Elsevier/Gold Standard (2013-07-04 13:26:01)

## 2017-09-28 NOTE — Progress Notes (Signed)
Chief Complaint  Patient presents with  . Follow-up  . Depression   F/u with daughter [name] 1. C/o depression>anxiety, insomnia, memory loss wants to see psychiatry and now since daughter going to [facility] will be home alone  2. C/o proximal leg/knee swelling and pain  3. EGD/colonoscopy needs to be sch with GI per pt resch'ed.   Review of Systems  Constitutional: Positive for weight loss.       11 lbs   HENT: Negative for hearing loss.   Eyes: Negative for blurred vision.  Respiratory: Negative for shortness of breath.   Cardiovascular: Negative for chest pain.  Gastrointestinal: Negative for abdominal pain.  Musculoskeletal: Positive for joint pain.  Skin: Negative for rash.  Neurological: Negative for headaches.  Psychiatric/Behavioral: Negative for depression.   Past Medical History:  Diagnosis Date  . (HFpEF) heart failure with preserved ejection fraction ([place])    a. Echo 2014: EF 65-70%, nl WM, mildly dilated LA, PASP nl; b. 12/2014 Echo: EF 65-70%, no rwma, mod septal hypertrophy w/o LVOT gradient or SAM; c. 07/2017 Echo: EF 55-60%, no rwma, mildly dil RV w/ nl syst fxn. Mildly dil RA. Dilated IVC w/ elevated CVP. Triv post effusion.  . (HFpEF) heart failure with preserved ejection fraction ([place])   . Anxiety   . Arthritis   . Asthma   . B12 deficiency   . Chronic back pain   . Chronic headaches   . Chronic pain    a. on methadone  . Coronary artery disease, non-occlusive    a. LHC 1/18: proximal to mid LAD 40% stenosed, mid LAD 30% stenosed, mid RCA 20% stenosed, distal RCA 20% stenosed, EF 55-65%, LVEDP normal  . Depression   . DJD (degenerative joint disease), multiple sites   . Frequent headaches   . Gallstone   . H/O non-ST elevation myocardial infarction (NSTEMI)   . Hand, foot and mouth disease 2016  . History of shingles   . Hypertension   . Iron deficiency anemia   . Long QT interval   . Long QT interval   . Methadone use ([place])    managed by Dr. Primus Bravo   . Migraine   . Obesity   . Palpitations    a. 24 hour Holter: NSR, sinus brady down to 48, occasional PVCs & couplets, 8 beats NSVT; b. 30 day event monitor 2015: NSR with rare PVC.  Marland Kitchen Psoriasis   . Syncope and collapse   . Vitamin D deficiency    Past Surgical History:  Procedure Laterality Date  . ABDOMINOPLASTY     tummy tuck ? year   . [place] SURGERY  2001  . CARDIAC CATHETERIZATION Left 04/16/2016   Procedure: Left Heart Cath and Coronary Angiography;  Surgeon: [name], MD;  Location: [facility];  Service: Cardiovascular;  Laterality: Left;  . CHOLECYSTECTOMY  2001  . GALLBLADDER SURGERY    . GASTRIC BYPASS    . GASTROPLASTY     Family History  Problem Relation Age of Onset  . Heart attack Mother   . Cancer Mother        pancreatitic   . Early death Mother   . Heart attack Father 1       MI  . Early death Father   . Heart disease Father   . Heart attack Brother   . Heart disease Brother   . Arthritis Brother   . Depression Brother   . Diabetes Brother   . Heart attack  Maternal Grandmother   . Cancer Maternal Grandmother        pancreatitic   . Heart disease Maternal Grandmother   . Cancer Maternal Uncle        pancreatitic   . Cancer Paternal Grandmother        ? type   . Diabetes Paternal Grandmother   . Cancer Maternal Uncle        pancreatitic    Social History   Socioeconomic History  . Marital status: Widowed    Spouse name: Not on file  . Number of children: Not on file  . Years of education: Not on file  . Highest education level: Not on file  Occupational History  . Not on file  Social Needs  . Financial resource strain: Not on file  . Food insecurity:    Worry: Not on file    Inability: Not on file  . Transportation needs:    Medical: Not on file    Non-medical: Not on file  Tobacco Use  . Smoking status: Never Smoker  . Smokeless tobacco: Never Used  Substance and Sexual Activity  . Alcohol use: No     Alcohol/week: 0.0 oz  . Drug use: No  . Sexual activity: Not Currently  Lifestyle  . Physical activity:    Days per week: Not on file    Minutes per session: Not on file  . Stress: Not on file  Relationships  . Social connections:    Talks on phone: Not on file    Gets together: Not on file    Attends religious service: Not on file    Active member of club or organization: Not on file    Attends meetings of clubs or organizations: Not on file    Relationship status: Not on file  . Intimate partner violence:    Fear of current or ex partner: Not on file    Emotionally abused: Not on file    Physically abused: Not on file    Forced sexual activity: Not on file  Other Topics Concern  . 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 albuterol (PROVENTIL HFA;VENTOLIN HFA) 108 (90 BASE) MCG/ACT inhaler Inhale 2 puffs into the lungs as needed for wheezing.  . Black Cohosh 175 MG CAPS Take 1 capsule by mouth daily.  . busPIRone (BUSPAR) 7.5 MG tablet Take 7.5 mg by mouth 2 (two) times daily.  . Cholecalciferol 50000 units capsule Take 1 capsule (50,000 Units total) by mouth once a week.  . folic acid (FOLVITE) 1 MG tablet TAKE 1 TABLET BY MOUTH EVERY DAY  . furosemide (LASIX) 20 MG tablet Take 2 tablets (40 mg) by mouth once daily x 3 days, then take 1 tablet (20 mg) by mouth once daily  . gabapentin (NEURONTIN) 400 MG capsule Limit 2 tablets in the a.m. and midday and 3 tablets each evening    Please dispense a three-month supply  . isosorbide mononitrate (IMDUR) 30 MG 24 hr tablet Take 1 tablet (30 mg total) by mouth daily.  . methadone (DOLOPHINE) 10 MG tablet Limit 1-2 tablets by mouth 2-3 times per day if tolerated  . PARoxetine (PAXIL) 20 MG tablet Take 20  mg by mouth daily.   Marland Kitchen  rOPINIRole (REQUIP) 0.25 MG tablet Take 0.5-0.75 mg by mouth at bedtime.   . rosuvastatin (CRESTOR) 10 MG tablet Take 1 tablet (10 mg total) by mouth daily.   Current Facility-Administered Medications for the 09/28/17 encounter (Office Visit) with McLean-Scocuzza, Nino Glow, MD  Medication  . bupivacaine (PF) (MARCAINE) 0.25 % injection 30 mL  . bupivacaine (PF) (MARCAINE) 0.25 % injection 30 mL  . fentaNYL (SUBLIMAZE) injection 100 mcg  . fentaNYL (SUBLIMAZE) injection 100 mcg  . lactated ringers infusion 1,000 mL  . lactated ringers infusion 1,000 mL  . lactated ringers infusion 1,000 mL  . lidocaine (PF) (XYLOCAINE) 1 % injection 10 mL  . midazolam (VERSED) 5 MG/5ML injection 5 mg  . orphenadrine (NORFLEX) injection 60 mg  . orphenadrine (NORFLEX) injection 60 mg  . triamcinolone acetonide (KENALOG-40) injection 40 mg  . triamcinolone acetonide (KENALOG-40) injection 40 mg   Allergies  Allergen Reactions  . Penicillins Hives, Shortness Of Breath and Rash    Has patient had a PCN reaction causing immediate rash, facial/tongue/throat swelling, SOB or lightheadedness with hypotension: Yes Has patient had a PCN reaction causing severe rash involving mucus membranes or skin necrosis: No Has patient had a PCN reaction that required hospitalization No Has patient had a PCN reaction occurring within the last 10 years: No If all of the above answers are "NO", then may proceed with Cephalosporin use.    Recent Results (from the past 2160 hour(s))  Basic metabolic panel     Status: Abnormal   Collection Time: 07/26/17  2:47 PM  Result Value Ref Range   Sodium 139 135 - 145 mmol/L   Potassium 3.3 (L) 3.5 - 5.1 mmol/L   Chloride 103 101 - 111 mmol/L   CO2 30 22 - 32 mmol/L   Glucose, Bld 90 65 - 99 mg/dL   BUN <5 (L) 6 - 20 mg/dL   Creatinine, Ser 0.77 0.44 - 1.00 mg/dL   Calcium 8.4 (L) 8.9 - 10.3 mg/dL   GFR calc non Af Amer >60 >60 mL/min   GFR calc Af Amer >60 >60 mL/min    Comment:  (NOTE) The eGFR has been calculated using the CKD EPI equation. This calculation has not been validated in all clinical situations. eGFR's persistently <60 mL/min signify possible Chronic Kidney Disease.    Anion gap 6 5 - 15    Comment: Performed at [facility], [address] [place], [place] [address]  TSH     Status: None   Collection Time: 07/26/17  2:47 PM  Result Value Ref Range   TSH 3.037 0.350 - 4.500 uIU/mL    Comment: Performed by a 3rd Generation assay with a functional sensitivity of <=0.01 uIU/mL. Performed at [facility], [address] [place], [place] [address]   CBC w/Diff     Status: Abnormal   Collection Time: 07/26/17  2:47 PM  Result Value Ref Range   WBC 5.6 3.6 - 11.0 K/uL   RBC 3.77 (L) 3.80 - 5.20 MIL/uL   Hemoglobin 11.5 (L) 12.0 - 16.0 g/dL   HCT 33.6 (L) 35.0 - 47.0 %   MCV 89.0 80.0 - 100.0 fL   MCH 30.4 26.0 - 34.0 pg   MCHC 34.2 32.0 - 36.0 g/dL   RDW 13.5 11.5 - 14.5 %   Platelets 165 150 - 440 K/uL   Neutrophils Relative % 59 %   Neutro Abs 3.3 1.4 - 6.5 K/uL  Lymphocytes Relative 29 %   Lymphs Abs 1.6 1.0 - 3.6 K/uL   Monocytes Relative 8 %   Monocytes Absolute 0.4 0.2 - 0.9 K/uL   Eosinophils Relative 3 %   Eosinophils Absolute 0.2 0 - 0.7 K/uL   Basophils Relative 1 %   Basophils Absolute 0.0 0 - 0.1 K/uL    Comment: Performed at [facility], [address] [place], [place] [address]  Ferritin     Status: None   Collection Time: 08/05/17  1:54 PM  Result Value Ref Range   Ferritin 18 11 - 307 ng/mL    Comment: Performed at [facility], [address] [place], [place] [address]  Iron and TIBC     Status: None   Collection Time: 08/05/17  1:54 PM  Result Value Ref Range   Iron 42 28 - 170 ug/dL   TIBC 345 250 - 450 ug/dL   Saturation Ratios 12 10.4 - 31.8 %   UIBC 303 ug/dL    Comment: Performed at [facility], [address] [place], [place] [address]  CBC with  Differential/Platelet     Status: Abnormal   Collection Time: 08/05/17  1:54 PM  Result Value Ref Range   WBC 5.9 3.6 - 11.0 K/uL   RBC 3.88 3.80 - 5.20 MIL/uL   Hemoglobin 11.6 (L) 12.0 - 16.0 g/dL   HCT 34.3 (L) 35.0 - 47.0 %   MCV 88.5 80.0 - 100.0 fL   MCH 30.0 26.0 - 34.0 pg   MCHC 33.9 32.0 - 36.0 g/dL   RDW 13.1 11.5 - 14.5 %   Platelets 211 150 - 440 K/uL   Neutrophils Relative % 49 %   Neutro Abs 2.9 1.4 - 6.5 K/uL   Lymphocytes Relative 37 %   Lymphs Abs 2.2 1.0 - 3.6 K/uL   Monocytes Relative 9 %   Monocytes Absolute 0.5 0.2 - 0.9 K/uL   Eosinophils Relative 4 %   Eosinophils Absolute 0.2 0 - 0.7 K/uL   Basophils Relative 1 %   Basophils Absolute 0.1 0 - 0.1 K/uL    Comment: Performed at [facility], [address] [place], [place] [address]  Folate     Status: None   Collection Time: 08/05/17  1:54 PM  Result Value Ref Range   Folate 11.0 >5.9 ng/mL    Comment: Performed at [facility], [address] [place], [place] [address]  Basic metabolic panel     Status: Abnormal   Collection Time: 08/05/17  1:54 PM  Result Value Ref Range   Sodium 139 135 - 145 mmol/L   Potassium 3.3 (L) 3.5 - 5.1 mmol/L   Chloride 103 101 - 111 mmol/L   CO2 30 22 - 32 mmol/L   Glucose, Bld 75 65 - 99 mg/dL   BUN <5 (L) 6 - 20 mg/dL   Creatinine, Ser 0.77 0.44 - 1.00 mg/dL   Calcium 8.7 (L) 8.9 - 10.3 mg/dL   GFR calc non Af Amer >60 >60 mL/min   GFR calc Af Amer >60 >60 mL/min    Comment: (NOTE) The eGFR has been calculated using the CKD EPI equation. This calculation has not been validated in all clinical situations. eGFR's persistently <60 mL/min signify possible Chronic Kidney Disease.    Anion gap 6 5 - 15    Comment: Performed at [facility], [address], [place], [place] [address]  Hepatic function panel     Status:  None   Collection Time: 08/18/17  9:54 AM  Result Value Ref Range   Total Bilirubin 0.4 0.2 - 1.2 mg/dL   Bilirubin, Direct  0.1 0.0 - 0.3 mg/dL   Alkaline Phosphatase 99 39 - 117 U/L   AST 20 0 - 37 U/L   ALT 11 0 - 35 U/L   Total Protein 6.7 6.0 - 8.3 g/dL   Albumin 3.7 3.5 - 5.2 g/dL  Hepatitis B surface antibody     Status: Abnormal   Collection Time: 08/18/17  9:54 AM  Result Value Ref Range   Hepatitis B-Post 6 (L) > OR = 10 mIU/mL    Comment: Verified by repeat analysis. . . Patient does not have immunity to hepatitis B virus. . For additional information, please refer to http://education.[facility].com/faq/FAQ105 (This link is being provided for informational/ educational purposes only).   Hepatitis B surface antigen     Status: None   Collection Time: 08/18/17  9:54 AM  Result Value Ref Range   Hepatitis B Surface Ag NON-REACTIVE NON-REACTI  Hepatitis C antibody     Status: None   Collection Time: 08/18/17  9:54 AM  Result Value Ref Range   Hepatitis C Ab NON-REACTIVE NON-REACTI   SIGNAL TO CUT-OFF 0.14 <1.00    Comment: . HCV antibody was non-reactive. There is no laboratory  evidence of HCV infection. . In most cases, no further action is required. However, if recent HCV exposure is suspected, a test for HCV RNA (test code [phone]) is suggested. . For additional information please refer to http://education.[facility].com/faq/FAQ22v1 (This link is being provided for informational/ educational purposes only.) .   Magnesium     Status: None   Collection Time: 08/18/17  9:54 AM  Result Value Ref Range   Magnesium 1.6 1.5 - 2.5 mg/dL  Urinalysis, Routine w reflex microscopic     Status: Abnormal   Collection Time: 08/18/17  9:54 AM  Result Value Ref Range   Specific Gravity, UA 1.007 1.005 - 1.030   pH, UA 5.5 5.0 - 7.5   Color, UA Yellow Yellow   Appearance Ur Clear Clear   Leukocytes, UA Trace (A) Negative   Protein, UA Negative Negative/Trace   Glucose, UA Negative Negative   Ketones, UA Negative Negative   RBC, UA Trace (A) Negative   Bilirubin, UA Negative  Negative   Urobilinogen, Ur 1.0 0.2 - 1.0 mg/dL   Nitrite, UA Negative Negative   Microscopic Examination See below:     Comment: Microscopic was indicated and was performed.  Vitamin D (25 hydroxy)     Status: Abnormal   Collection Time: 08/18/17  9:54 AM  Result Value Ref Range   VITD 19.48 (L) 30.00 - 100.00 ng/mL  Microscopic Examination     Status: None   Collection Time: 08/18/17  9:54 AM  Result Value Ref Range   WBC, UA 0-5 0 - 5 /hpf   RBC, UA 0-2 0 - 2 /hpf   Epithelial Cells (non renal) 0-10 0 - 10 /hpf   Casts None seen None seen /lpf   Mucus, UA Present Not Estab.   Bacteria, UA Few None seen/Few  BASIC METABOLIC PANEL WITH GFR     Status: Abnormal   Collection Time: 08/18/17  9:54 AM  Result Value Ref Range   Glucose, Bld 108 (H) 65 - 99 mg/dL    Comment: .            Fasting reference interval . For someone without known  diabetes, a glucose value between 100 and 125 mg/dL is consistent with prediabetes and should be confirmed with a follow-up test. .    BUN 5 (L) 7 - 25 mg/dL   Creat 0.86 0.50 - 1.10 mg/dL   GFR, Est Non African American 79 > OR = 60 mL/min/1.54m   GFR, Est African American 92 > OR = 60 mL/min/1.755m  BUN/Creatinine Ratio 6 6 - 22 (calc)   Sodium 146 135 - 146 mmol/L   Potassium 3.6 3.5 - 5.3 mmol/L   Chloride 106 98 - 110 mmol/L   CO2 26 20 - 32 mmol/L   Calcium 9.2 8.6 - 10.2 mg/dL  TEST AUTHORIZATION 2     Status: None   Collection Time: 08/18/17  9:54 AM  Result Value Ref Range   TEST NAME: BASIC METABOLIC PANEL    TEST CODE: [id number]    CLIENT CONTACT: LATOYA WRIGHT    REPORT ALWAYS MESSAGE SIGNATURE      Comment: . The laboratory testing on this patient was verbally requested or confirmed by the ordering physician or his or her authorized representative after contact with an employee of [employer] regulations require that we maintain on file written authorization for all laboratory testing.  Accordingly we  are asking that the ordering physician or his or her authorized representative sign a copy of this report and promptly return it to the client service representative. . . Signature:____________________________________________________ . Please fax this signed page to [phone] return it via your [employer].    Objective  Body mass index is 36.24 kg/m. Wt Readings from Last 3 Encounters:  09/28/17 224 lb 8 oz (101.8 kg)  08/26/17 235 lb (106.6 kg)  08/25/17 234 lb 9.6 oz (106.4 kg)   Temp Readings from Last 3 Encounters:  09/28/17 98.2 F (36.8 C) (Oral)  09/03/17 (!) 97 F (36.1 C) (Tympanic)  08/27/17 97.8 F (36.6 C) (Tympanic)   BP Readings from Last 3 Encounters:  09/28/17 124/74  09/03/17 108/72  08/27/17 130/80   Pulse Readings from Last 3 Encounters:  09/28/17 63  09/03/17 90  08/27/17 60    Physical Exam  Constitutional: She is oriented to person, place, and time. Vital signs are normal. She appears well-developed and well-nourished. She is cooperative.  HENT:  Head: Normocephalic and atraumatic.  Mouth/Throat: Oropharynx is clear and moist and mucous membranes are normal.  Eyes: Pupils are equal, round, and reactive to light. Conjunctivae are normal.  Cardiovascular: Normal rate, regular rhythm and normal heart sounds.  Pulmonary/Chest: Effort normal and breath sounds normal.  Musculoskeletal:  C/o anserine bursitis b/l    Neurological: She is alert and oriented to person, place, and time. Gait normal.  Skin: Skin is warm, dry and intact.  Psychiatric: She has a normal mood and affect. Her speech is normal and behavior is normal. Judgment and thought content normal. Cognition and memory are normal.  Nursing note and vitals reviewed.   Assessment   1. Depression>anxiety, insomnia and memory loss  2. C/w anserine bursitis with leg edema  3. HM Plan   1. Given lists of female therapist  Refer to RHLashmeetPt stopped paxil so  will start cymbalta 30 mg qd  2. [facility] lasix 40 mg qd prn  3.  Never gets flu shot  Consider Tdap ? Had 2009/2010 and shingrix in future  rec hep B vaccine  Consider check MMR in future  Pap pt has not had in a while wants to think about it if needed consider OB/GYN pt wants to wait  Colonoscopy referred today and consider EGD not sch yet  Mammogram 09/29/17 normal  DEXA 09/29/17 normal  Will need to check lipid in future  Pending home sleep study    Provider: Dr. Olivia Mackie McLean-Scocuzza-Internal Medicine

## 2017-09-29 ENCOUNTER — Ambulatory Visit
Admission: RE | Admit: 2017-09-29 | Discharge: 2017-09-29 | Disposition: A | Discharge status: Home / Self Care | Payer: Medicare PPO | Source: Ambulatory Visit | Attending: Internal Medicine | Admitting: Internal Medicine

## 2017-09-29 DIAGNOSIS — M810 Age-related osteoporosis without current pathological fracture: Secondary | ICD-10-CM | POA: Diagnosis not present

## 2017-09-29 DIAGNOSIS — Z1231 Encounter for screening mammogram for malignant neoplasm of breast: Secondary | ICD-10-CM

## 2017-09-30 ENCOUNTER — Encounter: Payer: Self-pay | Admitting: *Deleted

## 2017-09-30 ENCOUNTER — Ambulatory Visit
Admission: RE | Admit: 2017-09-30 | Discharge: 2017-09-30 | Disposition: A | Discharge status: Home / Self Care | Payer: Medicare PPO | Source: Ambulatory Visit | Attending: Gastroenterology | Admitting: Gastroenterology

## 2017-09-30 DIAGNOSIS — Z9049 Acquired absence of other specified parts of digestive tract: Secondary | ICD-10-CM | POA: Insufficient documentation

## 2017-09-30 DIAGNOSIS — R748 Abnormal levels of other serum enzymes: Secondary | ICD-10-CM | POA: Diagnosis not present

## 2017-10-01 DIAGNOSIS — R609 Edema, unspecified: Secondary | ICD-10-CM | POA: Insufficient documentation

## 2017-10-02 NOTE — Progress Notes (Signed)
Pt. Was scheduled for EGD/Colonoscopy on 09/11/17 but this was canceled. I do not know why. Pt has not contacted the office to reschedule. We will contact her and get her scheduled again.

## 2017-10-06 ENCOUNTER — Other Ambulatory Visit: Payer: Self-pay

## 2017-10-06 DIAGNOSIS — R1319 Other dysphagia: Secondary | ICD-10-CM

## 2017-10-06 DIAGNOSIS — Z1211 Encounter for screening for malignant neoplasm of colon: Secondary | ICD-10-CM

## 2017-10-06 DIAGNOSIS — R1310 Dysphagia, unspecified: Secondary | ICD-10-CM

## 2017-10-06 MED ORDER — PEG 3350-KCL-NA BICARB-NACL 420 G PO SOLR: 4000.0000 mL | Freq: Once | ORAL | 0 refills | Status: AC

## 2017-10-06 MED ORDER — BISACODYL 5 MG PO TBEC: 10.0000 mg | DELAYED_RELEASE_TABLET | Freq: Once | ORAL | 0 refills | Status: AC

## 2017-10-20 ENCOUNTER — Telehealth: Payer: Self-pay | Admitting: Gastroenterology

## 2017-10-20 ENCOUNTER — Encounter: Payer: Self-pay | Admitting: Internal Medicine

## 2017-10-20 NOTE — Telephone Encounter (Signed)
Patient states due to her daughter about to leave for college [facility] and would like a call back to reschedule.

## 2017-10-21 NOTE — Telephone Encounter (Signed)
Left message for pt to contact office

## 2017-10-22 ENCOUNTER — Ambulatory Visit: Admission: RE | Admit: 2017-10-22 | Payer: Medicare PPO | Source: Ambulatory Visit | Admitting: Gastroenterology

## 2017-10-22 ENCOUNTER — Encounter: Admission: RE | Payer: Self-pay | Source: Ambulatory Visit

## 2017-10-22 SURGERY — COLONOSCOPY WITH PROPOFOL
Anesthesia: General

## 2017-10-26 ENCOUNTER — Other Ambulatory Visit: Payer: Self-pay | Admitting: Internal Medicine

## 2017-10-26 DIAGNOSIS — F419 Anxiety disorder, unspecified: Secondary | ICD-10-CM

## 2017-10-26 DIAGNOSIS — F329 Major depressive disorder, single episode, unspecified: Secondary | ICD-10-CM

## 2017-10-26 DIAGNOSIS — F32A Depression, unspecified: Secondary | ICD-10-CM

## 2017-10-28 ENCOUNTER — Telehealth: Payer: Self-pay | Admitting: Cardiovascular Disease

## 2017-10-28 NOTE — Telephone Encounter (Signed)
Received records request Disability Determination Services forwarded to [employer] for processing.  

## 2017-10-30 ENCOUNTER — Encounter: Payer: Self-pay | Admitting: Diagnostic Neuroimaging

## 2017-10-30 ENCOUNTER — Ambulatory Visit (INDEPENDENT_AMBULATORY_CARE_PROVIDER_SITE_OTHER): Payer: Medicare Other | Admitting: Diagnostic Neuroimaging

## 2017-10-30 VITALS — BP 103/68 | HR 60 | Ht 66.0 in | Wt 229.0 lb

## 2017-10-30 DIAGNOSIS — I2510 Atherosclerotic heart disease of native coronary artery without angina pectoris: Secondary | ICD-10-CM | POA: Diagnosis not present

## 2017-10-30 DIAGNOSIS — R413 Other amnesia: Secondary | ICD-10-CM

## 2017-10-30 NOTE — Progress Notes (Signed)
[facility]  PATIENT: Cheyenne Gray DOB: 1966/02/03  REFERRING CLINICIAN: Mclean-Scocuzza HISTORY FROM: patient and friend  REASON FOR VISIT: new consult    HISTORICAL  CHIEF COMPLAINT:  Chief Complaint  Patient presents with  . Memory Loss    rm 6, New Pt , friend, Warner Mccreedy , "I can't remember stuff"  MMSE 27    HISTORY OF PRESENT ILLNESS:   52 year old female here for evaluation of memory loss.  Patient reports 2-year progressive short-term memory loss and confusion.  She may have had some memory loss even before this.  She had MRI of the brain in 2017 during the beginning of her memory loss and this was unremarkable.  Patient has significant insomnia, averaging 3 hours of sleep per night.  She also has depression, anxiety, pain, stress factors.  Patient having trouble remembering recent events, conversations, points.  She has a lot of Post-it notes around her home.  She tries to use her phone to set alarms but does not always use this correctly.  Her daughter who has recently gone to college used to help patient with these functions.  Now patient is having to rely on herself more.    REVIEW OF SYSTEMS: Full 14 system review of systems performed and negative with exception of: Fatigue swelling in legs wheezing constipation pain feeling hot increased thirst dizziness memory loss confusion snoring sleepiness restless legs shiftwork depression anxiety racing thoughts.  ALLERGIES: Allergies  Allergen Reactions  . Penicillins Hives, Shortness Of Breath and Rash    Has patient had a PCN reaction causing immediate rash, facial/tongue/throat swelling, SOB or lightheadedness with hypotension: Yes Has patient had a PCN reaction causing severe rash involving mucus membranes or skin necrosis: No Has patient had a PCN reaction that required hospitalization No Has patient had a PCN reaction occurring within the last 10 years: No If all of the above answers are "NO", then  may proceed with Cephalosporin use.     HOME MEDICATIONS: Outpatient Medications Prior to Visit  Medication Sig Dispense Refill  . albuterol (PROVENTIL HFA;VENTOLIN HFA) 108 (90 BASE) MCG/ACT inhaler Inhale 2 puffs into the lungs as needed for wheezing.    . Black Cohosh 175 MG CAPS Take 1 capsule by mouth daily.    . busPIRone (BUSPAR) 15 MG tablet 15 mg 2 (two) times daily.  1  . Cholecalciferol 50000 units capsule Take 1 capsule (50,000 Units total) by mouth once a week. 13 capsule 1  . diazepam (VALIUM) 5 MG tablet TAKE 1 TABLET BY MOUTH EVERY 12 HOURS AS NEEDED FOR MUSCLE SPASMS  1  . DULoxetine (CYMBALTA) 30 MG capsule TAKE 1 CAPSULE BY MOUTH EVERY DAY 90 capsule 2  . folic acid (FOLVITE) 1 MG tablet TAKE 1 TABLET BY MOUTH EVERY DAY 30 tablet 0  . furosemide (LASIX) 40 MG tablet Take 1 tablet (40 mg total) by mouth daily. In am 90 tablet 1  . gabapentin (NEURONTIN) 400 MG capsule Limit 2 tablets in the a.m. and midday and 3 tablets each evening    Please dispense a three-month supply 630 capsule 0  . isosorbide mononitrate (IMDUR) 30 MG 24 hr tablet Take 1 tablet (30 mg total) by mouth daily. 90 tablet 4  . methadone (DOLOPHINE) 10 MG tablet Limit 1-2 tablets by mouth 2-3 times per day if tolerated 180 tablet 0  . potassium chloride (K-DUR) 10 MEQ tablet Take by mouth. 10/30/17 not taking    . rOPINIRole (REQUIP) 0.25 MG tablet Take  0.5-0.75 mg by mouth at bedtime.     . rosuvastatin (CRESTOR) 10 MG tablet Take 1 tablet (10 mg total) by mouth daily. 90 tablet 4  . SUMAtriptan (IMITREX) 100 MG tablet TAKE 1 TABLET BY MOUTH AS NEEDED FOR HEADACHE. MAY REPEAT IN 2 HRS IF NEEDED. MAX 200MG/DAY  5  . SUMAtriptan (IMITREX) 50 MG tablet TAKE 1 TABLET BY MOUTH STAT, MAY REPEAT IN 2 HOURS, MAX 200MG PER DAY    . busPIRone (BUSPAR) 7.5 MG tablet Take 7.5 mg by mouth 2 (two) times daily.    . nitroGLYCERIN (NITROSTAT) 0.4 MG SL tablet Place 1 tablet (0.4 mg total) under the tongue every 5 (five)  minutes as needed for chest pain. 25 tablet 3   Facility-Administered Medications Prior to Visit  Medication Dose Route Frequency Provider Last Rate Last Dose  . bupivacaine (PF) (MARCAINE) 0.25 % injection 30 mL  30 mL Other Once Mohammed Kindle, MD      . bupivacaine (PF) (MARCAINE) 0.25 % injection 30 mL  30 mL Other Once Mohammed Kindle, MD      . fentaNYL (SUBLIMAZE) injection 100 mcg  100 mcg Intravenous Once Mohammed Kindle, MD      . fentaNYL (SUBLIMAZE) injection 100 mcg  100 mcg Intravenous Once Mohammed Kindle, MD      . lactated ringers infusion 1,000 mL  1,000 mL Intravenous Continuous Mohammed Kindle, MD      . lactated ringers infusion 1,000 mL  1,000 mL Intravenous Continuous Mohammed Kindle, MD      . lactated ringers infusion 1,000 mL  1,000 mL Intravenous Continuous Mohammed Kindle, MD 125 mL/hr at 08/06/15 1002 1,000 mL at 08/06/15 1002  . lidocaine (PF) (XYLOCAINE) 1 % injection 10 mL  10 mL Subcutaneous Once Mohammed Kindle, MD      . midazolam (VERSED) 5 MG/5ML injection 5 mg  5 mg Intravenous Once Mohammed Kindle, MD      . orphenadrine (NORFLEX) injection 60 mg  60 mg Intramuscular Once Mohammed Kindle, MD      . orphenadrine (NORFLEX) injection 60 mg  60 mg Intramuscular Once Mohammed Kindle, MD      . triamcinolone acetonide (KENALOG-40) injection 40 mg  40 mg Other Once Mohammed Kindle, MD      . triamcinolone acetonide (KENALOG-40) injection 40 mg  40 mg Other Once Mohammed Kindle, MD        PAST MEDICAL HISTORY: Past Medical History:  Diagnosis Date  . (HFpEF) heart failure with preserved ejection fraction ([place])    a. Echo 2014: EF 65-70%, nl WM, mildly dilated LA, PASP nl; b. 12/2014 Echo: EF 65-70%, no rwma, mod septal hypertrophy w/o LVOT gradient or SAM; c. 07/2017 Echo: EF 55-60%, no rwma, mildly dil RV w/ nl syst fxn. Mildly dil RA. Dilated IVC w/ elevated CVP. Triv post effusion.  . (HFpEF) heart failure with preserved ejection fraction ([place])   . Anxiety   . Arthritis    . Asthma   . B12 deficiency   . Chronic back pain   . Chronic headaches   . Chronic pain    a. on methadone  . Coronary artery disease, non-occlusive    a. LHC 1/18: proximal to mid LAD 40% stenosed, mid LAD 30% stenosed, mid RCA 20% stenosed, distal RCA 20% stenosed, EF 55-65%, LVEDP normal  . Depression   . DJD (degenerative joint disease), multiple sites   . Frequent headaches   . Gallstone   . H/O non-ST elevation myocardial infarction (  NSTEMI)   . Hand, foot and mouth disease 2016  . History of shingles   . Hypertension   . Iron deficiency anemia   . Long QT interval   . Long QT interval   . Methadone use ([place])    managed by Dr. Primus Bravo  . Migraine   . Obesity   . Palpitations    a. 24 hour Holter: NSR, sinus brady down to 48, occasional PVCs & couplets, 8 beats NSVT; b. 30 day event monitor 2015: NSR with rare PVC.  Marland Kitchen Psoriasis   . Syncope and collapse   . Vitamin D deficiency     PAST SURGICAL HISTORY: Past Surgical History:  Procedure Laterality Date  . ABDOMINOPLASTY     tummy tuck ? year   . [place] SURGERY  2001  . CARDIAC CATHETERIZATION Left 04/16/2016   Procedure: Left Heart Cath and Coronary Angiography;  Surgeon: [name], MD;  Location: [facility];  Service: Cardiovascular;  Laterality: Left;  . CHOLECYSTECTOMY  2001  . GALLBLADDER SURGERY    . GASTRIC BYPASS  2001  . GASTROPLASTY      FAMILY HISTORY: Family History  Problem Relation Age of Onset  . Heart attack Mother   . Cancer Mother        pancreatitic   . Early death Mother   . Heart attack Father 59       MI  . Early death Father   . Heart disease Father   . Heart attack Brother   . Heart disease Brother   . Arthritis Brother   . Depression Brother   . Diabetes Brother   . Heart attack Maternal Grandmother   . Cancer Maternal Grandmother        pancreatitic   . Heart disease Maternal Grandmother   . Cancer Maternal Uncle        pancreatitic   . Cancer  Paternal Grandmother        ? type   . Diabetes Paternal Grandmother   . Cancer Maternal Uncle        pancreatitic   . Breast cancer Maternal Aunt     SOCIAL HISTORY: Social History   Socioeconomic History  . Marital status: Widowed    Spouse name: Not on file  . Number of children: 1  . Years of education: assoc degree  . Highest education level: Not on file  Occupational History  . Not on file  Social Needs  . Financial resource strain: Not on file  . Food insecurity:    Worry: Not on file    Inability: Not on file  . Transportation needs:    Medical: Not on file    Non-medical: Not on file  Tobacco Use  . Smoking status: Never Smoker  . Smokeless tobacco: Never Used  Substance and Sexual Activity  . Alcohol use: No    Alcohol/week: 0.0 standard drinks    Comment: holidays  . Drug use: No  . Sexual activity: Not Currently  Lifestyle  . Physical activity:    Days per week: Not on file    Minutes per session: Not on file  . Stress: Not on file  Relationships  . Social connections:    Talks on phone: Not on file    Gets together: Not on file    Attends religious service: Not on file    Active member of club or organization: Not on file    Attends meetings of clubs or organizations: Not  on file    Relationship status: Not on file  . Intimate partner violence:    Fear of current or ex partner: Not on file    Emotionally abused: Not on file    Physically abused: Not on file    Forced sexual activity: Not on file  Other Topics Concern  . 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 Wt Readings from Last 3 Encounters:  10/30/17 229 lb (103.9 kg)  09/28/17 224 lb 8 oz (101.8 kg)  08/26/17 235 lb (106.6 kg)     Patient is in no distress; well developed, nourished and groomed; neck is supple  CARDIOVASCULAR:  Examination of carotid arteries is normal; no carotid bruits  Regular rate and rhythm, no murmurs  Examination of peripheral vascular system by observation and palpation is normal  EYES:  Ophthalmoscopic exam of optic discs and posterior segments is normal; no papilledema or hemorrhages  No exam data present  MUSCULOSKELETAL:  Gait, strength, tone, movements noted in Neurologic exam below  NEUROLOGIC: MENTAL STATUS:  MMSE - Mini Mental State Exam 10/30/2017  Orientation to time 5  Orientation to Place 5  Registration 3  Attention/ Calculation 4  Recall 3  Language- name 2 objects 2  Language- repeat 0  Language- follow 3 step command 3  Language- read & follow direction 1  Write a sentence 1  Copy design 0  Total score 27    awake, alert, oriented to person, place and time  recent and remote memory intact  normal attention and concentration  language fluent, comprehension intact, naming intact  fund of knowledge appropriate  CRANIAL NERVE:   2nd - no papilledema on fundoscopic exam  2nd, 3rd, 4th, 6th - pupils equal and reactive to light, visual fields full to confrontation, extraocular muscles intact, no nystagmus  5th - facial sensation symmetric  7th - facial strength symmetric  8th - hearing intact  9th - palate elevates symmetrically, uvula midline  11th - shoulder shrug symmetric  12th - tongue protrusion midline  MOTOR:   normal bulk and tone, full strength in the BUE, BLE  SENSORY:   normal and symmetric to light touch, temperature, vibration  COORDINATION:   finger-nose-finger, fine finger movements normal  REFLEXES:   deep tendon reflexes present and symmetric  GAIT/STATION:   narrow  based gait; WADDLING, ANTALGIC GAIT     DIAGNOSTIC DATA (LABS, IMAGING, TESTING) - I reviewed patient records, labs, notes, testing and imaging myself where available.  Lab Results  Component Value Date   WBC 5.9 08/05/2017   HGB 11.6 (L) 08/05/2017   HCT 34.3 (L) 08/05/2017   MCV 88.5 08/05/2017   PLT 211 08/05/2017      Component Value Date/Time   NA 146 08/18/2017 0954   NA 142 03/28/2016 1519   K 3.6 08/18/2017 0954   CL 106 08/18/2017 0954   CO2 26 08/18/2017 0954   GLUCOSE 108 (H) 08/18/2017 0954   BUN 5 (  L) 08/18/2017 0954   BUN 5 (L) 03/28/2016 1519   CREATININE 0.86 08/18/2017 0954   CALCIUM 9.2 08/18/2017 0954   PROT 6.7 08/18/2017 0954   PROT 6.6 10/02/2016 1037   PROT 6.4 05/04/2013 1556   ALBUMIN 3.7 08/18/2017 0954   ALBUMIN 3.7 10/02/2016 1037   ALBUMIN 3.1 (L) 05/04/2013 1556   AST 20 08/18/2017 0954   AST 38 (H) 05/04/2013 1556   ALT 11 08/18/2017 0954   ALT 39 05/04/2013 1556   ALKPHOS 99 08/18/2017 0954   ALKPHOS 84 05/04/2013 1556   BILITOT 0.4 08/18/2017 0954   BILITOT 0.3 10/02/2016 1037   BILITOT 0.3 05/04/2013 1556   GFRNONAA 79 08/18/2017 0954   GFRAA 92 08/18/2017 0954   Lab Results  Component Value Date   CHOL 114 10/02/2016   HDL 55 10/02/2016   LDLCALC 47 10/02/2016   TRIG 59 10/02/2016   CHOLHDL 2.1 10/02/2016   No results found for: HGBA1C Lab Results  Component Value Date   QIWLNLGX21 194 04/21/2017   Lab Results  Component Value Date   TSH 3.037 07/26/2017    07/26/15 MRI brain [I reviewed images myself and agree with interpretation. -VRP]  - unremarkable   ASSESSMENT AND PLAN  52 y.o. year old female here with:  Dx:  1. Memory loss      PLAN:  MEMORY LOSS (due to insomnia, depression, anxiety) - follow up with psychiatry (depression, anxiety) - safety / supervision issues reviewed - caution with driving and finances - optimize nutrition and exercises  Return if symptoms worsen or fail to improve,  for return to PCP.    Penni Bombard, MD 1/74/0814, 48:18 AM Certified in Neurology, Neurophysiology and Neuroimaging  [facility] [address], [place], [place] [address] [phone]

## 2017-10-30 NOTE — Patient Instructions (Signed)
Thank you for coming to see [place] at [facility]. I hope we have been able to provide you high quality care today.  You may receive a patient satisfaction survey over the next few weeks. We would appreciate your feedback and comments so that we may continue to improve ourselves and the health of our patients.  - follow up with psychiatry (depression, anxiety) - safety / supervision issues reviewed - caution with driving and finances - optimize nutrition and exercises   ~~~~~~~~~~~~~~~~~~~~~~~~~~~~~~~~~~~~~~~~~~~~~~~~~~~~~~~~~~~~~~~~~  DR. PENUMALLI'S GUIDE TO HAPPY AND HEALTHY LIVING These are some of my general health and wellness recommendations. Some of them may apply to you better than others. Please use common sense as you try these suggestions and feel free to ask me any questions.   ACTIVITY/FITNESS Mental, social, emotional and physical stimulation are very important for brain and body health. Try learning a new activity (arts, music, language, sports, games).  Keep moving your body to the best of your abilities. You can do this at home, inside or outside, the park, community center, gym or anywhere you like. Consider a physical therapist or personal trainer to get started. Fitness trackers, smart-watches or  smart-phones can help as well.   NUTRITION Eat more plants: colorful vegetables, nuts, seeds and berries.  Eat less sugar, salt, preservatives and processed foods.  Avoid toxins such as cigarettes and alcohol.  Drink water when you are thirsty. Warm water with a slice of lemon is an excellent morning drink to start the day.  Consider these websites for more information The Nutrition [employer] (https://www.henry-hernandez.biz/) [employer] Nutrition (WindowBlog.ch)   RELAXATION Consider practicing mindfulness meditation or other relaxation techniques such as deep breathing, prayer, yoga, tai chi, massage. See  website mindful.org or the apps Headspace or Calm to help get started.   SLEEP Try to get at least 7-8+ hours sleep per day. Regular exercise and reduced caffeine will help you sleep better. Practice good sleep hygeine techniques. See website sleep.org for more information.   PLANNING Prepare estate planning, living will, healthcare POA documents. Sometimes this is best planned with the help of an attorney. Theconversationproject.org and agingwithdignity.org are excellent resources.

## 2017-11-02 ENCOUNTER — Other Ambulatory Visit: Payer: Self-pay

## 2017-11-02 ENCOUNTER — Inpatient Hospital Stay: Payer: Medicare PPO

## 2017-11-02 DIAGNOSIS — D529 Folate deficiency anemia, unspecified: Secondary | ICD-10-CM | POA: Diagnosis not present

## 2017-11-02 DIAGNOSIS — D508 Other iron deficiency anemias: Secondary | ICD-10-CM

## 2017-11-02 DIAGNOSIS — R5383 Other fatigue: Secondary | ICD-10-CM | POA: Insufficient documentation

## 2017-11-02 DIAGNOSIS — Z9884 Bariatric surgery status: Secondary | ICD-10-CM | POA: Insufficient documentation

## 2017-11-02 LAB — COMPREHENSIVE METABOLIC PANEL
ALT: 32 U/L (ref 0–44)
AST: 50 U/L — AB (ref 15–41)
Albumin: 3.7 g/dL (ref 3.5–5.0)
Alkaline Phosphatase: 118 U/L (ref 38–126)
Anion gap: 11 (ref 5–15)
BUN: 7 mg/dL (ref 6–20)
CO2: 28 mmol/L (ref 22–32)
CREATININE: 0.86 mg/dL (ref 0.44–1.00)
Calcium: 9 mg/dL (ref 8.9–10.3)
Chloride: 100 mmol/L (ref 98–111)
GFR calc non Af Amer: >60 mL/min (ref >60)
Glucose, Bld: 125 mg/dL — ABNORMAL HIGH (ref 70–99)
Potassium: 3.4 mmol/L — ABNORMAL LOW (ref 3.5–5.1)
SODIUM: 139 mmol/L (ref 135–145)
Total Bilirubin: 0.7 mg/dL (ref 0.3–1.2)
Total Protein: 7.1 g/dL (ref 6.5–8.1)

## 2017-11-02 LAB — CBC WITH DIFFERENTIAL/PLATELET
Basophils Absolute: 0 10*3/uL (ref 0–0.1)
Basophils Relative: 1 %
Eosinophils Absolute: 0.2 10*3/uL (ref 0–0.7)
Eosinophils Relative: 3 %
HEMATOCRIT: 38.4 % (ref 35.0–47.0)
HEMOGLOBIN: 13 g/dL (ref 12.0–16.0)
LYMPHS PCT: 40 %
Lymphs Abs: 2.7 10*3/uL (ref 1.0–3.6)
MCH: 30.7 pg (ref 26.0–34.0)
MCHC: 34 g/dL (ref 32.0–36.0)
MCV: 90.4 fL (ref 80.0–100.0)
Monocytes Absolute: 0.5 10*3/uL (ref 0.2–0.9)
Monocytes Relative: 7 %
NEUTROS ABS: 3.3 10*3/uL (ref 1.4–6.5)
NEUTROS PCT: 49 %
Platelets: 191 10*3/uL (ref 150–440)
RBC: 4.24 MIL/uL (ref 3.80–5.20)
RDW: 14.4 % (ref 11.5–14.5)
WBC: 6.8 10*3/uL (ref 3.6–11.0)

## 2017-11-02 LAB — IRON AND TIBC
Iron: 47 ug/dL (ref 28–170)
Saturation Ratios: 17 % (ref 10.4–31.8)
TIBC: 285 ug/dL (ref 250–450)
UIBC: 239 ug/dL

## 2017-11-02 LAB — VITAMIN B12: Vitamin B-12: 457 pg/mL (ref 180–914)

## 2017-11-02 LAB — FERRITIN: Ferritin: 177 ng/mL (ref 11–307)

## 2017-11-02 LAB — FOLATE: Folate: 17.5 ng/mL (ref >5.9)

## 2017-11-03 ENCOUNTER — Encounter: Payer: Self-pay | Admitting: Oncology

## 2017-11-03 ENCOUNTER — Inpatient Hospital Stay: Payer: Medicare PPO | Attending: Oncology | Admitting: Oncology

## 2017-11-03 ENCOUNTER — Encounter: Payer: Self-pay | Admitting: Internal Medicine

## 2017-11-03 ENCOUNTER — Encounter (INDEPENDENT_AMBULATORY_CARE_PROVIDER_SITE_OTHER): Payer: Self-pay

## 2017-11-03 ENCOUNTER — Other Ambulatory Visit: Payer: Self-pay

## 2017-11-03 ENCOUNTER — Inpatient Hospital Stay: Payer: Medicare PPO

## 2017-11-03 VITALS — BP 101/67 | HR 68 | Temp 96.6°F | Resp 18 | Wt 228.0 lb

## 2017-11-03 DIAGNOSIS — D508 Other iron deficiency anemias: Secondary | ICD-10-CM

## 2017-11-03 DIAGNOSIS — Z9884 Bariatric surgery status: Secondary | ICD-10-CM

## 2017-11-03 DIAGNOSIS — D529 Folate deficiency anemia, unspecified: Secondary | ICD-10-CM

## 2017-11-03 DIAGNOSIS — R5383 Other fatigue: Secondary | ICD-10-CM | POA: Diagnosis not present

## 2017-11-03 MED ORDER — CYANOCOBALAMIN 500 MCG PO TABS: 500.0000 ug | ORAL_TABLET | Freq: Every day | ORAL | 6 refills | Status: DC

## 2017-11-03 MED ORDER — FOLIC ACID 1 MG PO TABS: 1.0000 mg | ORAL_TABLET | Freq: Every day | ORAL | 6 refills | Status: DC

## 2017-11-03 NOTE — Progress Notes (Signed)
Patient here for follow up. Pt feeling tired, with no energy.

## 2017-11-03 NOTE — Progress Notes (Signed)
Hematology/Oncology  Follow up note [facility] [phone] [phone]   Patient Care Team: McLean-Scocuzza, Nino Glow, MD as PCP - General (Internal Medicine) [name], MD as PCP - Cardiology (Cardiology) Donnie Coffin, MD as Referring Physician (Family Medicine) [name], MD as Consulting Physician (Cardiology)  REASON FOR VISIT Follow up for treatment of anemia. HISTORY OF PRESENTING ILLNESS:  This is a patient who used to follow up with Dr. Ma Hillock presents for follow-up of management of her anemia.   History of gastric bypass in 2001 Patient was last seen by Dr. Ma Hillock in July 2016 for iron deficiency.  She was again being seen by nurse practitioner on August 21, 2015 for follow-up of her iron deficiency anemia.  She has a history of iron deficiency anemia remote gastric bypass history in 2001.  She used to IV iron infusion with venofer 187m Every 3 months but she [place].  Patient reports feeling fatigued.  She did not have much of appetite.  Denies any nausea vomiting diarrhea or abdominal pain.  She has no unintentional weight loss, instead she feels that she has gained a few pounds.  # . She follows up with cardiology for CAD, long QT syndrome, chest pain, lower extremity edema.  INTERVAL HISTORY [place] MWANZA SZUMSKIis a 52y.o. female who has above history reviewed by me today presents for follow up visit for management of chronic anemia iron deficiency.  During interval she has received IV Venofer.  #Patient reports continued feeling fatigued.  She plans to establish care with new PCP. Denies weight loss, fever or chills or night sweats. Review of Systems  Constitutional: Positive for malaise/fatigue. Negative for chills, fever and weight loss.  HENT: Negative for hearing loss and nosebleeds.   Eyes: Negative for photophobia and pain.  Respiratory: Negative for cough and sputum production.     Cardiovascular: Negative for chest pain and palpitations.  Gastrointestinal: Negative for abdominal pain, heartburn, nausea and vomiting.  Genitourinary: Negative for dysuria.  Musculoskeletal: Negative for myalgias.  Skin: Negative for rash.  Neurological: Negative for dizziness.  Endo/Heme/Allergies: Does not bruise/bleed easily.  Psychiatric/Behavioral: Negative for depression.    MEDICAL HISTORY:  Past Medical History:  Diagnosis Date  . (HFpEF) heart failure with preserved ejection fraction ([place]    a. Echo 2014: EF 65-70%, nl WM, mildly dilated LA, PASP nl; b. 12/2014 Echo: EF 65-70%, no rwma, mod septal hypertrophy w/o LVOT gradient or SAM; c. 07/2017 Echo: EF 55-60%, no rwma, mildly dil RV w/ nl syst fxn. Mildly dil RA. Dilated IVC w/ elevated CVP. Triv post effusion.  . (HFpEF) heart failure with preserved ejection fraction ([place]   . Anxiety   . Arthritis   . Asthma   . B12 deficiency   . Chronic back pain   . Chronic headaches   . Chronic pain    a. on methadone  . Concussion    hx of 4  . Coronary artery disease, non-occlusive    a. LHC 1/18: proximal to mid LAD 40% stenosed, mid LAD 30% stenosed, mid RCA 20% stenosed, distal RCA 20% stenosed, EF 55-65%, LVEDP normal  . Depression   . DJD (degenerative joint disease), multiple sites   . Frequent headaches   . Gallstone   . H/O non-ST elevation myocardial infarction (NSTEMI)   . Hand, foot and mouth disease 2016  . History of shingles   . Hypertension   .  Iron deficiency anemia   . Long QT interval   . Long QT interval   . Methadone use ([place])    managed by Dr. Primus Bravo  . Migraine   . Obesity   . Palpitations    a. 24 hour Holter: NSR, sinus brady down to 48, occasional PVCs & couplets, 8 beats NSVT; b. 30 day event monitor 2015: NSR with rare PVC.  Marland Kitchen Psoriasis   . Syncope and collapse   . Vitamin D deficiency     SURGICAL HISTORY: Past Surgical History:  Procedure Laterality Date  . ABDOMINOPLASTY      tummy tuck ? year   . [place] SURGERY  2001  . CARDIAC CATHETERIZATION Left 04/16/2016   Procedure: Left Heart Cath and Coronary Angiography;  Surgeon: [name], MD;  Location: [facility];  Service: Cardiovascular;  Laterality: Left;  . CHOLECYSTECTOMY  2001  . GALLBLADDER SURGERY    . GASTRIC BYPASS  2001  . GASTROPLASTY      SOCIAL HISTORY: Social History   Socioeconomic History  . Marital status: Widowed    Spouse name: Not on file  . Number of children: 1  . Years of education: assoc degree  . Highest education level: Not on file  Occupational History  . Not on file  Social Needs  . Financial resource strain: Not on file  . Food insecurity:    Worry: Not on file    Inability: Not on file  . Transportation needs:    Medical: Not on file    Non-medical: Not on file  Tobacco Use  . Smoking status: Never Smoker  . Smokeless tobacco: Never Used  Substance and Sexual Activity  . Alcohol use: No    Alcohol/week: 0.0 standard drinks    Comment: holidays  . Drug use: No  . Sexual activity: Not Currently  Lifestyle  . Physical activity:    Days per week: Not on file    Minutes per session: Not on file  . Stress: Not on file  Relationships  . Social connections:    Talks on phone: Not on file    Gets together: Not on file    Attends religious service: Not on file    Active member of club or organization: Not on file    Attends meetings of clubs or organizations: Not on file    Relationship status: Not on file  . Intimate partner violence:    Fear of current or ex partner: Not on file    Emotionally abused: Not on file    Physically abused: Not on file    Forced sexual activity: Not on file  Other Topics Concern  . 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 Heart attack Mother   . Cancer Mother        pancreatitic   . Early death Mother   . Heart attack Father 36       MI  . Early death Father   . Heart disease Father   . Heart attack Brother   . Heart disease Brother   . Arthritis Brother   . Depression Brother   .  Diabetes Brother   . Heart attack Maternal Grandmother   . Cancer Maternal Grandmother        pancreatitic   . Heart disease Maternal Grandmother   . Cancer Maternal Uncle        pancreatitic   . Cancer Paternal Grandmother        ? type   . Diabetes Paternal Grandmother   . Cancer Maternal Uncle        pancreatitic   . Breast cancer Maternal Aunt     ALLERGIES:  is allergic to penicillins.  MEDICATIONS:  Current Outpatient Medications  Medication Sig Dispense Refill  . albuterol (PROVENTIL HFA;VENTOLIN HFA) 108 (90 BASE) MCG/ACT inhaler Inhale 2 puffs into the lungs as needed for wheezing.    . Black Cohosh 175 MG CAPS Take 1 capsule by mouth daily.    . busPIRone (BUSPAR) 15 MG tablet 15 mg 2 (two) times daily.  1  . Cholecalciferol 50000 units capsule Take 1 capsule (50,000 Units total) by mouth once a week. 13 capsule 1  . diazepam (VALIUM) 5 MG tablet TAKE 1 TABLET BY MOUTH EVERY 12 HOURS AS NEEDED FOR MUSCLE SPASMS  1  . DULoxetine (CYMBALTA) 30 MG capsule TAKE 1 CAPSULE BY MOUTH EVERY DAY 90 capsule 2  . folic acid (FOLVITE) 1 MG tablet Take 1 tablet (1 mg total) by mouth daily. 30 tablet 6  . furosemide (LASIX) 40 MG tablet Take 1 tablet (40 mg total) by mouth daily. In am 90 tablet 1  . gabapentin (NEURONTIN) 400 MG capsule Limit 2 tablets in the a.m. and midday and 3 tablets each evening    Please dispense a three-month supply 630 capsule 0  . isosorbide mononitrate (IMDUR) 30 MG 24 hr tablet Take 1 tablet (30 mg total) by mouth daily. 90 tablet 4  . methadone (DOLOPHINE) 10 MG tablet Limit 1-2 tablets by mouth 2-3 times per day if  tolerated 180 tablet 0  . potassium chloride (K-DUR) 10 MEQ tablet Take by mouth. 10/30/17 not taking    . rOPINIRole (REQUIP) 0.25 MG tablet Take 0.5-0.75 mg by mouth at bedtime.     . rosuvastatin (CRESTOR) 10 MG tablet Take 1 tablet (10 mg total) by mouth daily. 90 tablet 4  . SUMAtriptan (IMITREX) 100 MG tablet TAKE 1 TABLET BY MOUTH AS NEEDED FOR HEADACHE. MAY REPEAT IN 2 HRS IF NEEDED. MAX 200MG/DAY  5  . SUMAtriptan (IMITREX) 50 MG tablet TAKE 1 TABLET BY MOUTH STAT, MAY REPEAT IN 2 HOURS, MAX 200MG PER DAY    . nitroGLYCERIN (NITROSTAT) 0.4 MG SL tablet Place 1 tablet (0.4 mg total) under the tongue every 5 (five) minutes as needed for chest pain. 25 tablet 3  . vitamin B-12 (CYANOCOBALAMIN) 500 MCG tablet Take 1 tablet (500 mcg total) by mouth daily. 30 tablet 6   Current Facility-Administered Medications  Medication Dose Route Frequency Provider Last Rate Last Dose  . bupivacaine (PF) (MARCAINE) 0.25 % injection 30 mL  30 mL Other Once Mohammed Kindle, MD      . bupivacaine (PF) (MARCAINE) 0.25 % injection 30 mL  30 mL Other Once Mohammed Kindle, MD      . fentaNYL (SUBLIMAZE) injection 100 mcg  100 mcg Intravenous Once Mohammed Kindle, MD      . fentaNYL (SUBLIMAZE) injection 100 mcg  100 mcg Intravenous Once Mohammed Kindle, MD      . lactated ringers infusion 1,000 mL  1,000 mL Intravenous Continuous Mohammed Kindle, MD      .  lactated ringers infusion 1,000 mL  1,000 mL Intravenous Continuous Mohammed Kindle, MD      . lactated ringers infusion 1,000 mL  1,000 mL Intravenous Continuous Mohammed Kindle, MD 125 mL/hr at 08/06/15 1002 1,000 mL at 08/06/15 1002  . lidocaine (PF) (XYLOCAINE) 1 % injection 10 mL  10 mL Subcutaneous Once Mohammed Kindle, MD      . midazolam (VERSED) 5 MG/5ML injection 5 mg  5 mg Intravenous Once Mohammed Kindle, MD      . orphenadrine (NORFLEX) injection 60 mg  60 mg Intramuscular Once Mohammed Kindle, MD      . orphenadrine (NORFLEX) injection 60 mg  60 mg  Intramuscular Once Mohammed Kindle, MD      . triamcinolone acetonide (KENALOG-40) injection 40 mg  40 mg Other Once Mohammed Kindle, MD      . triamcinolone acetonide (KENALOG-40) injection 40 mg  40 mg Other Once Mohammed Kindle, MD         PHYSICAL EXAMINATION: ECOG PERFORMANCE STATUS: 1 - Symptomatic but completely ambulatory Vitals:   11/03/17 1003  BP: 101/67  Pulse: 68  Resp: 18  Temp: (!) 96.6 F (35.9 C)  SpO2: 95%   Filed Weights   11/03/17 1003  Weight: 228 lb (103.4 kg)    Physical Exam  Constitutional: She is oriented to person, place, and time and well-developed, well-nourished, and in no distress. No distress.  Obese  HENT:  Head: Normocephalic and atraumatic.  Nose: Nose normal.  Mouth/Throat: Oropharynx is clear and moist. No oropharyngeal exudate.  Eyes: Pupils are equal, round, and reactive to light. Conjunctivae and EOM are normal. Left eye exhibits no discharge. No scleral icterus.  Neck: Normal range of motion. Neck supple. No JVD present.  Cardiovascular: Normal rate, regular rhythm and normal heart sounds.  No murmur heard. Pulmonary/Chest: Effort normal and breath sounds normal. No respiratory distress. She has no wheezes. She has no rales. She exhibits no tenderness.  Abdominal: Soft. Bowel sounds are normal. She exhibits no distension and no mass. There is no tenderness. There is no rebound.  Musculoskeletal: Normal range of motion. She exhibits no edema, tenderness or deformity.  Lymphadenopathy:    She has no cervical adenopathy.  Neurological: She is alert and oriented to person, place, and time. No cranial nerve deficit. She exhibits normal muscle tone. Coordination normal.  Skin: Skin is warm and dry. No rash noted. She is not diaphoretic. No erythema.  Psychiatric: Affect and judgment normal.     LABORATORY DATA:  I have reviewed the data as listed Lab Results  Component Value Date   WBC 6.8 11/02/2017   HGB 13.0 11/02/2017   HCT 38.4  11/02/2017   MCV 90.4 11/02/2017   PLT 191 11/02/2017   Recent Labs    04/21/17 1118  08/05/17 1354 08/18/17 0954 11/02/17 1029  NA 139   < > 139 146 139  K 3.0*   < > 3.3* 3.6 3.4*  CL 102   < > 103 106 100  CO2 30   < > _0 GLUCOSE 106*   < > 75 108* 125*  BUN 5*   < > <5* 5* 7  CREATININE 0.94   < > 0.77 0.86 0.86  CALCIUM 8.7*   < > 8.7* 9.2 9.0  GFRNONAA >60   < > >60 79 >60  GFRAA >60   < > >60 92 >60  PROT 6.8  --   --  6.7 7.1  ALBUMIN 3.4*  --   --  3.7 3.7  AST 46*  --   --  20 50*  ALT 25  --   --  11 32  ALKPHOS 109  --   --  99 118  BILITOT 0.4  --   --  0.4 0.7  BILIDIR  --   --   --  0.1  --    < > = values in this interval not displayed.       ASSESSMENT & PLAN:  1. Other iron deficiency anemia   2. Anemia due to folic acid deficiency, unspecified deficiency type   3. Gastric bypass status for obesity   4. Other fatigue    #Labs reviewed and discussed with patient.  Hemoglobin 13 improved.  Iron panel consistent with adequate iron store.  Hold additional IV Venofer. Recommend patient to continue folic acid 85m daily.  Given her history of gastric bypass, I also recommend patient to take vitamin B12 500 MCG daily. #Fatigue, discussed with patient that fatigue may not be related to her previous anemia/iron deficiency as she does not feel any improvement after iron deficiency anemia was corrected.  Recommend patient to continue follow-up with her primary care physician for further evaluation of her multiple chronic problems as well as fatigue.  All questions were answered. The patient knows to call the clinic with any problems questions or concerns.  Return of visit: 3 months.  repeat labs prior to visit, and possible Venofer.  Orders Placed This Encounter  Procedures  . CBC with Differential/Platelet    Standing Status:   Future    Standing Expiration Date:   11/04/2018  . Iron and TIBC    Standing Status:   Future    Standing Expiration Date:    11/04/2018  . Folate    Standing Status:   Future    Standing Expiration Date:   11/04/2018  . Ferritin    Standing Status:   Future    Standing Expiration Date:   11/04/2018  . Vitamin B12    Standing Status:   Future    Standing Expiration Date:   06/04/2018   Total face to face encounter time for this patient visit was 15 min. >50% of the time was  spent in counseling and coordination of care.  ZEarlie Server MD, PhD Hematology Oncology [facility] Centerat AUnity Lind[facility]aks Surgery Center LLCPager- [phone]/20/2019

## 2017-11-06 ENCOUNTER — Ambulatory Visit (INDEPENDENT_AMBULATORY_CARE_PROVIDER_SITE_OTHER): Payer: Medicare Other | Admitting: Psychiatry

## 2017-11-06 ENCOUNTER — Encounter: Payer: Self-pay | Admitting: Psychiatry

## 2017-11-06 ENCOUNTER — Other Ambulatory Visit: Payer: Self-pay

## 2017-11-06 VITALS — BP 113/76 | HR 69 | Temp 98.0°F | Wt 226.2 lb

## 2017-11-06 DIAGNOSIS — G4700 Insomnia, unspecified: Secondary | ICD-10-CM

## 2017-11-06 DIAGNOSIS — F411 Generalized anxiety disorder: Secondary | ICD-10-CM | POA: Diagnosis not present

## 2017-11-06 DIAGNOSIS — F4310 Post-traumatic stress disorder, unspecified: Secondary | ICD-10-CM

## 2017-11-06 DIAGNOSIS — R41840 Attention and concentration deficit: Secondary | ICD-10-CM

## 2017-11-06 DIAGNOSIS — I2510 Atherosclerotic heart disease of native coronary artery without angina pectoris: Secondary | ICD-10-CM | POA: Diagnosis not present

## 2017-11-06 DIAGNOSIS — R413 Other amnesia: Secondary | ICD-10-CM

## 2017-11-06 DIAGNOSIS — F331 Major depressive disorder, recurrent, moderate: Secondary | ICD-10-CM

## 2017-11-06 DIAGNOSIS — F1120 Opioid dependence, uncomplicated: Secondary | ICD-10-CM

## 2017-11-06 MED ORDER — MIRTAZAPINE 15 MG PO TABS: 7.5000 mg | ORAL_TABLET | Freq: Every day | ORAL | 0 refills | Status: DC

## 2017-11-06 MED ORDER — BUSPIRONE HCL 15 MG PO TABS: 15.0000 mg | ORAL_TABLET | Freq: Two times a day (BID) | ORAL | 0 refills | Status: DC

## 2017-11-06 NOTE — Progress Notes (Addendum)
Erroneous encounter

## 2017-11-06 NOTE — Patient Instructions (Signed)
Mirtazapine tablets What is this medicine? MIRTAZAPINE (mir TAZ a peen) is used to treat depression. This medicine may be used for other purposes; ask your health care provider or pharmacist if you have questions. COMMON BRAND NAME(S): Remeron What should I tell my health care provider before I take this medicine? They need to know if you have any of these conditions: -bipolar disorder -glaucoma -kidney disease -liver disease -suicidal thoughts -an unusual or allergic reaction to mirtazapine, other medicines, foods, dyes, or preservatives -pregnant or trying to get pregnant -breast-feeding How should I use this medicine? Take this medicine by mouth with a glass of water. Follow the directions on the prescription label. Take your medicine at regular intervals. Do not take your medicine more often than directed. Do not stop taking this medicine suddenly except upon the advice of your doctor. Stopping this medicine too quickly may cause serious side effects or your condition may worsen. A special MedGuide will be given to you by the pharmacist with each prescription and refill. Be sure to read this information carefully each time. Talk to your pediatrician regarding the use of this medicine in children. Special care may be needed. Overdosage: If you think you have taken too much of this medicine contact a poison control center or emergency room at once. NOTE: This medicine is only for you. Do not share this medicine with others. What if I miss a dose? If you miss a dose, take it as soon as you can. If it is almost time for your next dose, take only that dose. Do not take double or extra doses. What may interact with this medicine? Do not take this medicine with any of the following medications: -linezolid -MAOIs like Carbex, Eldepryl, Marplan, Nardil, and Parnate -methylene blue (injected into a vein) This medicine may also interact with the following medications: -alcohol -antiviral  medicines for HIV or AIDS -certain medicines that treat or prevent blood clots like warfarin -certain medicines for depression, anxiety, or psychotic disturbances -certain medicines for fungal infections like ketoconazole and itraconazole -certain medicines for migraine headache like almotriptan, eletriptan, frovatriptan, naratriptan, rizatriptan, sumatriptan, zolmitriptan -certain medicines for seizures like carbamazepine or phenytoin -certain medicines for sleep -cimetidine -erythromycin -fentanyl -lithium -medicines for blood pressure -nefazodone -rasagiline -rifampin -supplements like St. John's wort, kava kava, valerian -tramadol -tryptophan This list may not describe all possible interactions. Give your health care provider a list of all the medicines, herbs, non-prescription drugs, or dietary supplements you use. Also tell them if you smoke, drink alcohol, or use illegal drugs. Some items may interact with your medicine. What should I watch for while using this medicine? Tell your doctor if your symptoms do not get better or if they get worse. Visit your doctor or health care professional for regular checks on your progress. Because it may take several weeks to see the full effects of this medicine, it is important to continue your treatment as prescribed by your doctor. Patients and their families should watch out for new or worsening thoughts of suicide or depression. Also watch out for sudden changes in feelings such as feeling anxious, agitated, panicky, irritable, hostile, aggressive, impulsive, severely restless, overly excited and hyperactive, or not being able to sleep. If this happens, especially at the beginning of treatment or after a change in dose, call your health care professional. You may get drowsy or dizzy. Do not drive, use machinery, or do anything that needs mental alertness until you know how this medicine affects you. Do not   stand or sit up quickly, especially if  you are an older patient. This reduces the risk of dizzy or fainting spells. Alcohol may interfere with the effect of this medicine. Avoid alcoholic drinks. This medicine may cause dry eyes and blurred vision. If you wear contact lenses you may feel some discomfort. Lubricating drops may help. See your eye doctor if the problem does not go away or is severe. Your mouth may get dry. Chewing sugarless gum or sucking hard candy, and drinking plenty of water may help. Contact your doctor if the problem does not go away or is severe. What side effects may I notice from receiving this medicine? Side effects that you should report to your doctor or health care professional as soon as possible: -allergic reactions like skin rash, itching or hives, swelling of the face, lips, or tongue -anxious -changes in vision -chest pain -confusion -elevated mood, decreased need for sleep, racing thoughts, impulsive behavior -eye pain -fast, irregular heartbeat -feeling faint or lightheaded, falls -feeling agitated, angry, or irritable -fever or chills, sore throat -hallucination, loss of contact with reality -loss of balance or coordination -mouth sores -redness, blistering, peeling or loosening of the skin, including inside the mouth -restlessness, pacing, inability to keep still -seizures -stiff muscles -suicidal thoughts or other mood changes -trouble passing urine or change in the amount of urine -trouble sleeping -unusual bleeding or bruising -unusually weak or tired -vomiting Side effects that usually do not require medical attention (report to your doctor or health care professional if they continue or are bothersome): -change in appetite -constipation -dizziness -dry mouth -muscle aches or pains -nausea -tired -weight gain This list may not describe all possible side effects. Call your doctor for medical advice about side effects. You may report side effects to FDA at 1-800-FDA-1088. Where  should I keep my medicine? Keep out of the reach of children. Store at room temperature between 15 and 30 degrees C (59 and 86 degrees F) Protect from light and moisture. Throw away any unused medicine after the expiration date. NOTE: This sheet is a summary. It may not cover all possible information. If you have questions about this medicine, talk to your doctor, pharmacist, or health care provider.  2018 Elsevier/Gold Standard (2015-08-02 17:30:45)  
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
## 2017-11-09 ENCOUNTER — Encounter: Payer: Medicare Other | Admitting: Cardiovascular Disease

## 2017-11-09 ENCOUNTER — Encounter: Payer: Self-pay | Admitting: Cardiovascular Disease
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
## 2017-11-10 ENCOUNTER — Ambulatory Visit (INDEPENDENT_AMBULATORY_CARE_PROVIDER_SITE_OTHER): Payer: Medicare Other | Admitting: Cardiovascular Disease

## 2017-11-10 ENCOUNTER — Encounter: Payer: Self-pay | Admitting: Cardiovascular Disease

## 2017-11-10 VITALS — BP 122/78 | HR 69 | Ht 66.0 in | Wt 222.2 lb

## 2017-11-10 DIAGNOSIS — I2510 Atherosclerotic heart disease of native coronary artery without angina pectoris: Secondary | ICD-10-CM | POA: Diagnosis not present

## 2017-11-10 DIAGNOSIS — I5032 Chronic diastolic (congestive) heart failure: Secondary | ICD-10-CM | POA: Diagnosis not present

## 2017-11-10 DIAGNOSIS — I739 Peripheral vascular disease, unspecified: Secondary | ICD-10-CM | POA: Diagnosis not present

## 2017-11-10 DIAGNOSIS — I25118 Atherosclerotic heart disease of native coronary artery with other forms of angina pectoris: Secondary | ICD-10-CM

## 2017-11-10 DIAGNOSIS — I472 Ventricular tachycardia: Secondary | ICD-10-CM

## 2017-11-10 DIAGNOSIS — I4729 Other ventricular tachycardia: Secondary | ICD-10-CM

## 2017-11-10 DIAGNOSIS — E782 Mixed hyperlipidemia: Secondary | ICD-10-CM

## 2017-11-10 DIAGNOSIS — I10 Essential (primary) hypertension: Secondary | ICD-10-CM

## 2017-11-10 DIAGNOSIS — I5031 Acute diastolic (congestive) heart failure: Secondary | ICD-10-CM

## 2017-11-10 NOTE — Patient Instructions (Addendum)
Medication Instructions:   Hold the crestor for one month to see if legs get better  For diarrhea or lasix take potassium   Labwork:  No new labs needed  Testing/Procedures:  No further testing at this time   Follow-Up: It was a pleasure seeing you in the office today. Please call us if you have new issues that need to be addressed before your next appt.  [phone]  Your physician wants you to follow-up in: 12 months.  You will receive a reminder letter in the mail two months in advance. If you don't receive a letter, please call our office to schedule the follow-up appointment.  If you need a refill on your cardiac medications before your next appointment, please call your pharmacy.  For educational health videos Log in to : [email or web address] Or : [email or web address], password : [email or web address]

## 2017-11-18 ENCOUNTER — Ambulatory Visit: Payer: Medicare Other | Admitting: Licensed Clinical Social Worker

## 2017-11-23 ENCOUNTER — Ambulatory Visit (INDEPENDENT_AMBULATORY_CARE_PROVIDER_SITE_OTHER): Payer: Medicare Other | Admitting: Licensed Clinical Social Worker

## 2017-11-23 ENCOUNTER — Other Ambulatory Visit: Payer: Self-pay

## 2017-11-23 ENCOUNTER — Encounter: Payer: Self-pay | Admitting: Psychiatry

## 2017-11-23 ENCOUNTER — Ambulatory Visit (INDEPENDENT_AMBULATORY_CARE_PROVIDER_SITE_OTHER): Payer: Medicare Other | Admitting: Psychiatry

## 2017-11-23 VITALS — BP 128/76 | HR 65 | Temp 97.9°F | Wt 225.8 lb

## 2017-11-23 DIAGNOSIS — F4310 Post-traumatic stress disorder, unspecified: Secondary | ICD-10-CM

## 2017-11-23 DIAGNOSIS — F331 Major depressive disorder, recurrent, moderate: Secondary | ICD-10-CM

## 2017-11-23 DIAGNOSIS — R41840 Attention and concentration deficit: Secondary | ICD-10-CM

## 2017-11-23 DIAGNOSIS — F411 Generalized anxiety disorder: Secondary | ICD-10-CM

## 2017-11-23 DIAGNOSIS — F1120 Opioid dependence, uncomplicated: Secondary | ICD-10-CM

## 2017-11-23 DIAGNOSIS — G4700 Insomnia, unspecified: Secondary | ICD-10-CM

## 2017-11-23 DIAGNOSIS — I2510 Atherosclerotic heart disease of native coronary artery without angina pectoris: Secondary | ICD-10-CM

## 2017-11-23 MED ORDER — MIRTAZAPINE 15 MG PO TABS: 15.0000 mg | ORAL_TABLET | Freq: Every day | ORAL | 0 refills | Status: DC

## 2017-11-23 NOTE — Progress Notes (Signed)
[facility] MD OP Progress Note  11/23/2017 12:53 PM Cheyenne Gray  MRN:  [id number]  Chief Complaint: 'I am here for follow up visits." Chief Complaint    Follow-up; Medication Problem; Fatigue; Stress; Agitation; Anxiety; Depression     HPI: Cheyenne Gray is a 52 year old Caucasian female, widowed, on disability, has a history of depression, anxiety, multiple medical problems like heart failure, vitamin B12 deficiency, history of prolonged QT, chronic back pain on methadone, vitamin D deficiency, iron deficiency, history of gastric bypass, non-ST elevation MI, hypertension, presented to the clinic today for a follow-up visit.  Patient today presented along with her sister who also provided collateral information.  Patient today reports she started taking the mirtazapine 7.5 mg at bedtime.  She reports sleep is improved.  She however reports she would like the dosage increased today to see if that will help her sleep better.  She sleeps 2-4 hours at night now.  She reports that  when she has a  restless  night the next day she sleeps during the day.  She continues to take the Cymbalta as prescribed.  She is also on BuSpar however she has not been taking it on a regular basis.  This was discussed with patient last visit that she can start taking the BuSpar daily however she has not followed through with instructions.  Patient denies any suicidality.  Patient reports she could not get the ADHD testing done .  She will start seeing therapist for psychotherapy session today.  Patient denies any side effects to medications.  She continues to be on multiple medications as well as has multiple medical problems going on which makes her tired as well as gives her cognitive problems, memory issues so on and so on.  This was discussed in depth with patient and she is aware of the same. Visit Diagnosis:    ICD-10-CM   1. PTSD (post-traumatic stress disorder) F43.10 mirtazapine (REMERON) 15 MG tablet  2. GAD  (generalized anxiety disorder) F41.1 mirtazapine (REMERON) 15 MG tablet  3. MDD (major depressive disorder), recurrent episode, moderate (HCC) F33.1   4. Insomnia, unspecified type G47.00 mirtazapine (REMERON) 15 MG tablet  5. Opioid dependence in controlled environment ([place]) F11.20   6. Attention and concentration deficit R41.840     Past Psychiatric History: I have reviewed past psychiatric history from my progress note on 11/06/2017.  Past trials of Paxil, Cymbalta, Valium, melatonin  Past Medical History:  Past Medical History:  Diagnosis Date  . (HFpEF) heart failure with preserved ejection fraction ([place])    a. Echo 2014: EF 65-70%, nl WM, mildly dilated LA, PASP nl; b. 12/2014 Echo: EF 65-70%, no rwma, mod septal hypertrophy w/o LVOT gradient or SAM; c. 07/2017 Echo: EF 55-60%, no rwma, mildly dil RV w/ nl syst fxn. Mildly dil RA. Dilated IVC w/ elevated CVP. Triv post effusion.  . (HFpEF) heart failure with preserved ejection fraction ([place])   . Anxiety   . Arthritis   . Asthma   . B12 deficiency   . Chronic back pain   . Chronic headaches   . Chronic pain    a. on methadone  . Concussion    hx of 4  . Coronary artery disease, non-occlusive    a. LHC 1/18: proximal to mid LAD 40% stenosed, mid LAD 30% stenosed, mid RCA 20% stenosed, distal RCA 20% stenosed, EF 55-65%, LVEDP normal  . Depression   . DJD (degenerative joint disease), multiple sites   . Frequent headaches   .  Gallstone   . H/O non-ST elevation myocardial infarction (NSTEMI)   . Hand, foot and mouth disease 2016  . History of shingles   . Hypertension   . Iron deficiency anemia   . Long QT interval   . Long QT interval   . Methadone use ([place])    managed by Dr. Primus Bravo  . Migraine   . Obesity   . Palpitations    a. 24 hour Holter: NSR, sinus brady down to 48, occasional PVCs & couplets, 8 beats NSVT; b. 30 day event monitor 2015: NSR with rare PVC.  Marland Kitchen Psoriasis   . Syncope and collapse   . Vitamin D  deficiency     Past Surgical History:  Procedure Laterality Date  . ABDOMINOPLASTY     tummy tuck ? year   . [place] SURGERY  2001  . CARDIAC CATHETERIZATION Left 04/16/2016   Procedure: Left Heart Cath and Coronary Angiography;  Surgeon: [name], MD;  Location: [facility];  Service: Cardiovascular;  Laterality: Left;  . CHOLECYSTECTOMY  2001  . GALLBLADDER SURGERY    . GASTRIC BYPASS  2001  . GASTROPLASTY      Family Psychiatric History: Reviewed family psychiatric history from my progress note on 11/06/2017  Family History:  Family History  Problem Relation Age of Onset  . Heart attack Mother   . Cancer Mother        pancreatitic   . Early death Mother   . Heart attack Father 70       MI  . Early death Father   . Heart disease Father   . Heart attack Brother   . Heart disease Brother   . Arthritis Brother   . Depression Brother   . Diabetes Brother   . Heart attack Maternal Grandmother   . Cancer Maternal Grandmother        pancreatitic   . Heart disease Maternal Grandmother   . Cancer Maternal Uncle        pancreatitic   . Cancer Paternal Grandmother        ? type   . Diabetes Paternal Grandmother   . Cancer Maternal Uncle        pancreatitic   . Breast cancer Maternal Aunt     Social History: Reviewed social history from my progress note on 11/06/2017 Social History   Socioeconomic History  . Marital status: Widowed    Spouse name: Not on file  . Number of children: 1  . Years of education: assoc degree  . Highest education level: Associate degree: occupational, Hotel manager, or vocational program  Occupational History  . Not on file  Social Needs  . Financial resource strain: Very hard  . Food insecurity:    Worry: Never true    Inability: Never true  . Transportation needs:    Medical: No    Non-medical: No  Tobacco Use  . Smoking status: Never Smoker  . Smokeless tobacco: Never Used  Substance and Sexual Activity  . Alcohol  use: No    Alcohol/week: 0.0 standard drinks    Comment: holidays  . Drug use: No  . Sexual activity: Not Currently  Lifestyle  . Physical activity:    Days per week: 0 days    Minutes per session: 0 min  . Stress: Very much  Relationships  . Social connections:    Talks on phone: Once a week    Gets together: Never    Attends religious service: Never  Active member of club or organization: No    Attends meetings of clubs or organizations: Never    Relationship status: Widowed  Other Topics Concern  . 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 Penicillins Hives, Shortness Of Breath and Rash    Has patient had a PCN reaction causing immediate rash, facial/tongue/throat swelling, SOB or lightheadedness with hypotension: Yes Has patient had a PCN reaction causing severe rash involving mucus membranes or skin necrosis: No Has patient had a PCN reaction that required hospitalization No Has patient had a PCN reaction occurring within the last 10 years: No If all of the above answers are "NO", then may proceed with Cephalosporin use.     Metabolic Disorder Labs: No results found for: HGBA1C, MPG No results found for: PROLACTIN Lab Results  Component Value Date   CHOL 114 10/02/2016   TRIG 59 10/02/2016   HDL 55 10/02/2016   CHOLHDL 2.1 10/02/2016   VLDL 14 01/30/2015   LDLCALC 47 10/02/2016   LDLCALC 94 01/30/2015   Lab Results  Component Value Date   TSH 3.037 07/26/2017    Therapeutic Level Labs: No results found for: LITHIUM No results found for: VALPROATE No components found for:  CBMZ  Current Medications: Current Outpatient Medications  Medication Sig Dispense Refill  . albuterol (PROVENTIL HFA;VENTOLIN HFA) 108 (90 BASE)  MCG/ACT inhaler Inhale 2 puffs into the lungs as needed for wheezing.    . Black Cohosh 175 MG CAPS Take 1 capsule by mouth daily.    . busPIRone (BUSPAR) 15 MG tablet Take 1 tablet (15 mg total) by mouth 2 (two) times daily. 180 tablet 0  . Cholecalciferol 50000 units capsule Take 1 capsule (50,000 Units total) by mouth once a week. 13 capsule 1  . DULoxetine (CYMBALTA) 30 MG capsule TAKE 1 CAPSULE BY MOUTH EVERY DAY 90 capsule 2  . folic acid (FOLVITE) 1 MG tablet Take 1 tablet (1 mg total) by mouth daily. 30 tablet 6  . furosemide (LASIX) 40 MG tablet Take 1 tablet (40 mg total) by mouth daily. In am 90 tablet 1  . gabapentin (NEURONTIN) 400 MG capsule Limit 2 tablets in the a.m. and midday and 3 tablets each evening    Please dispense a three-month supply 630 capsule 0  . isosorbide mononitrate (IMDUR) 30 MG 24 hr tablet Take 1 tablet (30 mg total) by mouth daily. 90 tablet 4  . methadone (DOLOPHINE) 10 MG tablet Limit 1-2 tablets by mouth 2-3 times per day if tolerated 180 tablet 0  . mirtazapine (REMERON) 15 MG tablet Take 1 tablet (15 mg total) by mouth at bedtime. For sleep, mood 90 tablet 0  . potassium chloride (K-DUR) 10 MEQ tablet Take by mouth. 10/30/17 not taking    . rOPINIRole (REQUIP) 0.25 MG tablet Take 0.5-0.75 mg by mouth at bedtime.     . rosuvastatin (CRESTOR) 10 MG tablet Take 1 tablet (10 mg total) by mouth daily. 90 tablet 4  . SUMAtriptan (IMITREX) 100 MG tablet TAKE 1 TABLET BY MOUTH AS NEEDED FOR HEADACHE. MAY REPEAT IN 2 HRS IF NEEDED. MAX 200MG/DAY  5  . SUMAtriptan (IMITREX) 50 MG  tablet TAKE 1 TABLET BY MOUTH STAT, MAY REPEAT IN 2 HOURS, MAX 200MG PER DAY    . vitamin B-12 (CYANOCOBALAMIN) 500 MCG tablet Take 1 tablet (500 mcg total) by mouth daily. 30 tablet 6  . nitroGLYCERIN (NITROSTAT) 0.4 MG SL tablet Place 1 tablet (0.4 mg total) under the tongue every 5 (five) minutes as needed for chest pain. 25 tablet 3   Current Facility-Administered Medications   Medication Dose Route Frequency Provider Last Rate Last Dose  . bupivacaine (PF) (MARCAINE) 0.25 % injection 30 mL  30 mL Other Once Mohammed Kindle, MD      . bupivacaine (PF) (MARCAINE) 0.25 % injection 30 mL  30 mL Other Once Mohammed Kindle, MD      . fentaNYL (SUBLIMAZE) injection 100 mcg  100 mcg Intravenous Once Mohammed Kindle, MD      . fentaNYL (SUBLIMAZE) injection 100 mcg  100 mcg Intravenous Once Mohammed Kindle, MD      . lactated ringers infusion 1,000 mL  1,000 mL Intravenous Continuous Mohammed Kindle, MD      . lactated ringers infusion 1,000 mL  1,000 mL Intravenous Continuous Mohammed Kindle, MD      . lactated ringers infusion 1,000 mL  1,000 mL Intravenous Continuous Mohammed Kindle, MD 125 mL/hr at 08/06/15 1002 1,000 mL at 08/06/15 1002  . lidocaine (PF) (XYLOCAINE) 1 % injection 10 mL  10 mL Subcutaneous Once Mohammed Kindle, MD      . midazolam (VERSED) 5 MG/5ML injection 5 mg  5 mg Intravenous Once Mohammed Kindle, MD      . orphenadrine (NORFLEX) injection 60 mg  60 mg Intramuscular Once Mohammed Kindle, MD      . orphenadrine (NORFLEX) injection 60 mg  60 mg Intramuscular Once Mohammed Kindle, MD      . triamcinolone acetonide (KENALOG-40) injection 40 mg  40 mg Other Once Mohammed Kindle, MD      . triamcinolone acetonide (KENALOG-40) injection 40 mg  40 mg Other Once Mohammed Kindle, MD         Musculoskeletal: Strength & Muscle Tone: within normal limits Gait & Station: normal Patient leans: N/A  Psychiatric Specialty Exam: Review of Systems  Psychiatric/Behavioral: Positive for depression. The patient is nervous/anxious and has insomnia.   All other systems reviewed and are negative.   Blood pressure 128/76, pulse 65, temperature 97.9 F (36.6 C), temperature source Oral, weight 225 lb 12.8 oz (102.4 kg), last menstrual period 02/18/2016.Body mass index is 36.45 kg/m.  General Appearance: Casual  Eye Contact:  Fair  Speech:  Clear and Coherent  Volume:  Normal   Mood:  Anxious  Affect:  Congruent  Thought Process:  Goal Directed and Descriptions of Associations: Intact  Orientation:  Full (Time, Place, and Person)  Thought Content: WDL   Suicidal Thoughts:  No  Homicidal Thoughts:  No  Memory:  Immediate;   Fair Recent;   Fair Remote;   Fair  Judgement:  Fair  Insight:  Fair  Psychomotor Activity:  Increased and Restlessness  Concentration:  Concentration: Fair and Attention Span: Fair  Recall:  [employer] of Knowledge: Fair  Language: Fair  Akathisia:  No  Handed:  Right  AIMS (if indicated): na  Assets:  Communication Skills Desire for Improvement Social Support  ADL's:  Intact  Cognition: WNL  Sleep:  restless   Screenings: GAD-7     Office Visit from 09/21/2015 in [facility]  Total GAD-7 Score  4  Mini-Mental     Office Visit from 10/30/2017 in [facility]  Total Score (max 30 points )  27    PHQ2-9     Office Visit from 08/18/2017 in [facility] Office Visit from 11/15/2015 in [facility] Office Visit from 10/18/2015 in [facility] Office Visit from 09/21/2015 in [facility] Office Visit from 07/25/2015 in [facility]  PHQ-2 Total Score  0  0  0  0  0       Assessment and Plan: Cheyenne Gray is a 52 year old Caucasian female, widowed, has a history of depression, anxiety as well as multiple medical problems including coronary artery disease, history of NSTEMI, prolonged QT per hx , gastric bypass, vitamin D, vitamin B12 deficiency, chronic pain on methadone, presented to the clinic today for a follow-up visit.  Patient is biologically predisposed given her history of medical problems, trauma as well as family history of mental health problems.  Patient also has psychosocial stressors of  being widowed, several deaths in the family as well as multiple health problems.  Patient will benefit from medication management as noted below as well as psychotherapy.  Plan For PTSD Continue Cymbalta 30 mg p.o. daily Continue BuSpar 15 mg p.o. twice daily.  Discussed with patient to start taking it daily since she has been noncompliant with it. She will start psychotherapy with Ms. Alden Hipp today.  MDD Cymbalta 30 mg p.o. daily Increase mirtazapine to 15 mg p.o. nightly  For GAD Cymbalta 30 mg p.o. daily Mirtazapine 15 mg p.o. nightly BuSpar 15 twice a day  Memory problems MMSE was completed on 11/06/2017-30 out of 30 Patient also has vitamin B12 deficiency which is currently being managed by her PMD. Also discussed with her the effect of medications like methadone which can have an impact on her cognitive function.  For history of attention and concentration problem Patient has been referred for ADHD testing  For insomnia Remeron 15 mg p.o. nightly  Opioid dependence in controlled environment on methadone Patient will continue to follow-up with her pain provider.  Patient reports she is interested in stimulant medications for ADHD if her ADHD testing comes back positive.  Discussed with patient that she needs to have a cardiology clearance as well as a written letter from her cardiologist stating that she is cleared to be started on stimulant medication.  She also needs regular follow-up EKGs to rule out cardiac issues, also given her history of prolonged QT.  Discussed with her she is on multiple medications that can have an impact on her QT.  Patient is aware about the risk of having prolonged QT including cardiac arrest and death.  Follow-up in clinic in 4 weeks or sooner if needed.  More than 50 % of the time was spent for psychoeducation and supportive psychotherapy and care coordination.  This note was generated in part or whole with voice recognition software.  Voice recognition is usually quite accurate but there are transcription errors that can and very often do occur. I apologize for any typographical errors that were not detected and corrected.         Ursula Alert, MD 11/23/2017, 12:53 PM
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
## 2017-11-24 ENCOUNTER — Encounter: Payer: Self-pay | Admitting: Emergency Medicine

## 2017-11-24 ENCOUNTER — Other Ambulatory Visit: Payer: Self-pay

## 2017-11-24 ENCOUNTER — Encounter: Payer: Self-pay | Admitting: Internal Medicine

## 2017-11-24 ENCOUNTER — Emergency Department: Payer: Medicare PPO

## 2017-11-24 DIAGNOSIS — Z79899 Other long term (current) drug therapy: Secondary | ICD-10-CM | POA: Insufficient documentation

## 2017-11-24 DIAGNOSIS — S60212A Contusion of left wrist, initial encounter: Secondary | ICD-10-CM | POA: Diagnosis not present

## 2017-11-24 DIAGNOSIS — Y929 Unspecified place or not applicable: Secondary | ICD-10-CM | POA: Insufficient documentation

## 2017-11-24 DIAGNOSIS — Y939 Activity, unspecified: Secondary | ICD-10-CM | POA: Insufficient documentation

## 2017-11-24 DIAGNOSIS — S6992XA Unspecified injury of left wrist, hand and finger(s), initial encounter: Secondary | ICD-10-CM | POA: Diagnosis present

## 2017-11-24 DIAGNOSIS — J45909 Unspecified asthma, uncomplicated: Secondary | ICD-10-CM | POA: Diagnosis not present

## 2017-11-24 DIAGNOSIS — W208XXA Other cause of strike by thrown, projected or falling object, initial encounter: Secondary | ICD-10-CM | POA: Diagnosis not present

## 2017-11-24 DIAGNOSIS — I2510 Atherosclerotic heart disease of native coronary artery without angina pectoris: Secondary | ICD-10-CM | POA: Diagnosis not present

## 2017-11-24 DIAGNOSIS — Y999 Unspecified external cause status: Secondary | ICD-10-CM | POA: Insufficient documentation

## 2017-11-24 NOTE — ED Triage Notes (Signed)
Patient ambulatory to triage with steady gait, without difficulty or distress noted; pt reports while cleaning, pulled up a heavy duty blind and it fell; she put her hand up and it hit her hand up injuring left wrist

## 2017-11-25 ENCOUNTER — Emergency Department: Payer: Medicare PPO

## 2017-11-25 ENCOUNTER — Emergency Department
Admission: EM | Admit: 2017-11-25 | Discharge: 2017-11-25 | Disposition: A | Discharge status: Home / Self Care | Payer: Medicare PPO | Attending: Emergency Medicine | Admitting: Emergency Medicine

## 2017-11-25 DIAGNOSIS — S60212A Contusion of left wrist, initial encounter: Secondary | ICD-10-CM

## 2017-11-25 DIAGNOSIS — T1490XA Injury, unspecified, initial encounter: Secondary | ICD-10-CM

## 2017-11-25 MED ORDER — HYDROCODONE-ACETAMINOPHEN 5-325 MG PO TABS
1.0000 | ORAL_TABLET | Freq: Once | ORAL | Status: AC
  Administered 2017-11-25: 1 via ORAL
  Filled 2017-11-25: qty 1

## 2017-11-25 MED ORDER — IBUPROFEN 600 MG PO TABS: 600.0000 mg | ORAL_TABLET | Freq: Four times a day (QID) | ORAL | 0 refills | Status: DC | PRN

## 2017-11-25 NOTE — ED Notes (Signed)
Pt refuses vital signs.

## 2017-11-25 NOTE — ED Notes (Signed)
Pt. Verbalizes understanding of d/c instructions, medications, and follow-up.  Pt. In NAD at time of d/c and denies further concerns regarding this visit. Pt. Stable at the time of departure from the unit, departing unit by the safest and most appropriate manner per that pt condition and limitations with all belongings accounted for. Pt advised to return to the ED at any time for emergent concerns, or for new/worsening symptoms.   

## 2017-11-25 NOTE — Discharge Instructions (Signed)
Make an appointment to follow-up with the orthopedist in approximately 1 week.  You should keep the splint on until that time.  You likely have a contusion of the wrist, but because you have tenderness at the base of the thumb we cannot completely rule out a scaphoid fracture.  This is why you need to follow-up with the specialist.  Return to the ER for new, worsening, persistent severe pain, weakness or numbness, or any other new or worsening symptoms that concern you.

## 2017-11-25 NOTE — ED Provider Notes (Signed)
[facility] Emergency Department Provider Note ____________________________________________   First MD Initiated Contact with Patient 11/25/17 [phone]     (approximate)  I have reviewed the triage vital signs and the nursing notes.   HISTORY  Chief Complaint Wrist Pain    HPI Cheyenne Gray is a 52 y.o. female with PMH as noted below who presents with left wrist and hand injury, acute onset when a set of blinds fell onto her hand, and not associated with other injuries.  The patient denies numbness but states it hurts to move the hand.  Past Medical History:  Diagnosis Date  . (HFpEF) heart failure with preserved ejection fraction (HCC)    a. Echo 2014: EF 65-70%, nl WM, mildly dilated LA, PASP nl; b. 12/2014 Echo: EF 65-70%, no rwma, mod septal hypertrophy w/o LVOT gradient or SAM; c. 07/2017 Echo: EF 55-60%, no rwma, mildly dil RV w/ nl syst fxn. Mildly dil RA. Dilated IVC w/ elevated CVP. Triv post effusion.  . (HFpEF) heart failure with preserved ejection fraction (HCC)   . Anxiety   . Arthritis   . Asthma   . B12 deficiency   . Chronic back pain   . Chronic headaches   . Chronic pain    a. on methadone  . Concussion    hx of 4  . Coronary artery disease, non-occlusive    a. LHC 1/18: proximal to mid LAD 40% stenosed, mid LAD 30% stenosed, mid RCA 20% stenosed, distal RCA 20% stenosed, EF 55-65%, LVEDP normal  . Depression   . DJD (degenerative joint disease), multiple sites   . Frequent headaches   . Gallstone   . H/O non-ST elevation myocardial infarction (NSTEMI)   . Hand, foot and mouth disease 2016  . History of shingles   . Hypertension   . Iron deficiency anemia   . Long QT interval   . Long QT interval   . Methadone use (HCC)    managed by Dr. Metta Clines  . Migraine   . Obesity   . Palpitations    a. 24 hour Holter: NSR, sinus brady down to 48, occasional PVCs & couplets, 8 beats NSVT; b. 30 day event monitor 2015: NSR with rare PVC.    Marland Kitchen Psoriasis   . Syncope and collapse   . Vitamin D deficiency     Patient Active Problem List   Diagnosis Date Noted  . Insomnia 10/01/2017  . Edema 10/01/2017  . Anxiety and depression 09/28/2017  . Vitamin D deficiency 09/28/2017  . (HFpEF) heart failure with preserved ejection fraction (HCC) 08/18/2017  . Chest pain   . Positive cardiac stress test   . Spinal stenosis, lumbar region, with neurogenic claudication 10/18/2015  . Lumbar radiculopathy 10/18/2015  . Migraine 09/21/2015  . Chronic tension-type headache, intractable 08/08/2015  . Medication overuse headache 08/08/2015  . Bilateral occipital neuralgia 06/04/2015  . Migraine headache 06/04/2015  . Angina pectoris (HCC) 02/07/2015  . IDA (iron deficiency anemia) 09/19/2014  . 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 DDD (degenerative disc disease), lumbar 09/14/2014  . Sacroiliac joint dysfunction 09/14/2014  . Facet syndrome, lumbar 09/14/2014  . Mixed hyperlipidemia 01/25/2014  . PVC (premature ventricular contraction) 01/10/2014  . Fatigue 05/25/2013  . CAD (coronary artery disease) 04/22/2013  . PVD (peripheral vascular disease) (HCC) 04/22/2013  . Long Q-T syndrome 07/09/2012  . Chest pressure 07/09/2012  . NSVT (nonsustained ventricular tachycardia) (HCC) 07/09/2012  . Syncope 07/09/2012  . MUSCLE STRAIN 07/20/2009  Past Surgical History:  Procedure Laterality Date  . ABDOMINOPLASTY     tummy tuck ? year   . BARIATRIC SURGERY  2001  . CARDIAC CATHETERIZATION Left 04/16/2016   Procedure: Left Heart Cath and Coronary Angiography;  Surgeon: Antonieta Iba, MD;  Location: [facility] INVASIVE CV LAB;  Service: Cardiovascular;  Laterality: Left;  . CHOLECYSTECTOMY  2001  . GALLBLADDER SURGERY    . GASTRIC BYPASS  2001  . GASTROPLASTY      Prior to Admission medications   Medication Sig Start Date End Date Taking? Authorizing Provider  albuterol (PROVENTIL HFA;VENTOLIN HFA) 108 (90 BASE)  MCG/ACT inhaler Inhale 2 puffs into the lungs as needed for wheezing.    [provider]  Black Cohosh 175 MG CAPS Take 1 capsule by mouth daily.    [provider]  busPIRone (BUSPAR) 15 MG tablet Take 1 tablet (15 mg total) by mouth 2 (two) times daily. 11/06/17   Jomarie Longs, MD  Cholecalciferol 50000 units capsule Take 1 capsule (50,000 Units total) by mouth once a week. 08/18/17   McLean-Scocuzza, Pasty Spillers, MD  DULoxetine (CYMBALTA) 30 MG capsule TAKE 1 CAPSULE BY MOUTH EVERY DAY 10/26/17   McLean-Scocuzza, Pasty Spillers, MD  folic acid (FOLVITE) 1 MG tablet Take 1 tablet (1 mg total) by mouth daily. 11/03/17   Rickard Patience, MD  furosemide (LASIX) 40 MG tablet Take 1 tablet (40 mg total) by mouth daily. In am 09/28/17   McLean-Scocuzza, Pasty Spillers, MD  gabapentin (NEURONTIN) 400 MG capsule Limit 2 tablets in the a.m. and midday and 3 tablets each evening    Please dispense a three-month supply 11/15/15   Ewing Schlein, MD  ibuprofen (ADVIL,MOTRIN) 600 MG tablet Take 1 tablet (600 mg total) by mouth every 6 (six) hours as needed. 11/25/17   Dionne Bucy, MD  isosorbide mononitrate (IMDUR) 30 MG 24 hr tablet Take 1 tablet (30 mg total) by mouth daily. 04/03/17   Antonieta Iba, MD  methadone (DOLOPHINE) 10 MG tablet Limit 1-2 tablets by mouth 2-3 times per day if tolerated 11/15/15   Ewing Schlein, MD  mirtazapine (REMERON) 15 MG tablet Take 1 tablet (15 mg total) by mouth at bedtime. For sleep, mood 11/23/17   Jomarie Longs, MD  nitroGLYCERIN (NITROSTAT) 0.4 MG SL tablet Place 1 tablet (0.4 mg total) under the tongue every 5 (five) minutes as needed for chest pain. 12/07/15 11/10/17  Antonieta Iba, MD  potassium chloride (K-DUR) 10 MEQ tablet Take by mouth. 10/30/17 not taking 02/07/15   [provider]  rOPINIRole (REQUIP) 0.25 MG tablet Take 0.5-0.75 mg by mouth at bedtime.     [provider]  rosuvastatin (CRESTOR) 10 MG tablet Take 1 tablet (10 mg total) by mouth  daily. 04/03/17   Antonieta Iba, MD  SUMAtriptan (IMITREX) 100 MG tablet TAKE 1 TABLET BY MOUTH AS NEEDED FOR HEADACHE. MAY REPEAT IN 2 HRS IF NEEDED. MAX 200MG /DAY 10/09/17   [provider]  SUMAtriptan (IMITREX) 50 MG tablet TAKE 1 TABLET BY MOUTH STAT, MAY REPEAT IN 2 HOURS, MAX 200MG  PER DAY 08/05/14   [provider]  vitamin B-12 (CYANOCOBALAMIN) 500 MCG tablet Take 1 tablet (500 mcg total) by mouth daily. 11/03/17   Rickard Patience, MD    Allergies Penicillins  Family History  Problem Relation Age of Onset  . Heart attack Mother   . Cancer Mother        pancreatitic   . Early death Mother   .  Heart attack Father 85       MI  . Early death Father   . Heart disease Father   . Heart attack Brother   . Heart disease Brother   . Arthritis Brother   . Depression Brother   . Diabetes Brother   . Heart attack Maternal Grandmother   . Cancer Maternal Grandmother        pancreatitic   . Heart disease Maternal Grandmother   . Cancer Maternal Uncle        pancreatitic   . Cancer Paternal Grandmother        ? type   . Diabetes Paternal Grandmother   . Cancer Maternal Uncle        pancreatitic   . Breast cancer Maternal Aunt     Social History Social History   Tobacco Use  . Smoking status: Never Smoker  . Smokeless tobacco: Never Used  Substance Use Topics  . Alcohol use: No    Alcohol/week: 0.0 standard drinks    Comment: holidays  . Drug use: No    Review of Systems   ENT: No neck pain. Gastrointestinal: No vomiting. Musculoskeletal: Positive for left wrist injury. Neurological: Negative for weakness or numbness.   ____________________________________________   PHYSICAL EXAM:  VITAL SIGNS: ED Triage Vitals [11/24/17 2253]  Enc Vitals Group     BP (!) 142/95     Pulse Rate 64     Resp      Temp 98.1 F (36.7 C)     Temp Source Oral     SpO2 97 %     Weight 225 lb (102.1 kg)     Height 5\' 4"  (1.626 m)     Head Circumference      Peak  Flow      Pain Score 10     Pain Loc      Pain Edu?      Excl. in GC?     Constitutional: Alert and oriented. Well appearing and in no acute distress. Eyes: Conjunctivae are normal.  Head: Atraumatic. Nose: No congestion/rhinnorhea. Mouth/Throat: Mucous membranes are moist.   Neck: Normal range of motion.  Cardiovascular: Good peripheral circulation. Respiratory: Normal respiratory effort.   Gastrointestinal: No distention.  Musculoskeletal: Extremities warm and well perfused.  Tenderness to dorsal radial aspect of left hand and wrist with no deformity.  Mild snuffbox tenderness.  Mild swelling.  2+ radial pulse. Neurologic:  Normal speech and language.  Motor and sensory intact in median, ulnar, and radial distributions of left hand.   Skin:  Skin is warm and dry. No rash noted. Psychiatric: Mood and affect are normal. Speech and behavior are normal.  ____________________________________________   LABS (all labs ordered are listed, but only abnormal results are displayed)  Labs Reviewed - No data to display ____________________________________________  EKG   ____________________________________________  RADIOLOGY  XR L wrist: No acute fracture XR L hand: No acute fracture  ____________________________________________   PROCEDURES  Procedure(s) performed: No  Procedures  Critical Care performed: No ____________________________________________   INITIAL IMPRESSION / ASSESSMENT AND PLAN / ED COURSE  Pertinent labs & imaging results that were available during my care of the patient were reviewed by me and considered in my medical decision making (see chart for details).  51 year old female with PMH as noted above presents with left hand and wrist injury after a set of blinds fell onto the hand.  There is no deformity.  The hand is neuro/vascular intact.  Overall I  suspect most likely contusion, but we will obtain x-rays to rule out fracture.  Given the snuffbox  tenderness I will send the patient home with a volar splint even if negative x-ray.  ----------------------------------------- 6:30 AM on 11/25/2017 -----------------------------------------  X-rays are negative.  Splint has been applied.  Return precautions given, and the patient expresses understanding.  ____________________________________________   FINAL CLINICAL IMPRESSION(S) / ED DIAGNOSES  Final diagnoses:  Contusion of left wrist, initial encounter      NEW MEDICATIONS STARTED DURING THIS VISIT:  New Prescriptions   IBUPROFEN (ADVIL,MOTRIN) 600 MG TABLET    Take 1 tablet (600 mg total) by mouth every 6 (six) hours as needed.     Note:  This document was prepared using Dragon voice recognition software and may include unintentional dictation errors.     Dionne Bucy, MD 11/25/17 [phone]

## 2017-11-30 ENCOUNTER — Ambulatory Visit (INDEPENDENT_AMBULATORY_CARE_PROVIDER_SITE_OTHER): Payer: Medicare Other | Admitting: Gastroenterology

## 2017-11-30 ENCOUNTER — Ambulatory Visit: Payer: Medicare PPO | Admitting: Gastroenterology

## 2017-11-30 ENCOUNTER — Encounter: Payer: Self-pay | Admitting: Gastroenterology

## 2017-11-30 ENCOUNTER — Ambulatory Visit (INDEPENDENT_AMBULATORY_CARE_PROVIDER_SITE_OTHER): Payer: Medicare Other | Admitting: Licensed Clinical Social Worker

## 2017-11-30 ENCOUNTER — Encounter: Payer: Self-pay | Admitting: Licensed Clinical Social Worker

## 2017-11-30 VITALS — BP 117/75 | HR 61 | Ht 66.0 in | Wt 224.8 lb

## 2017-11-30 DIAGNOSIS — R748 Abnormal levels of other serum enzymes: Secondary | ICD-10-CM

## 2017-11-30 DIAGNOSIS — F411 Generalized anxiety disorder: Secondary | ICD-10-CM

## 2017-11-30 NOTE — Patient Instructions (Signed)
F/U  3 months 

## 2017-11-30 NOTE — Progress Notes (Signed)
   THERAPIST PROGRESS NOTE  Session Time: 10:00-10:30  Participation Level: Active  Behavioral Response: CasualAlertDepressed  Type of Therapy: Individual Therapy  Treatment Goals addressed: Anxiety  Interventions: DBT  Summary: Cheyenne DillingJodie M Gray is a 52 y.o. female who presents with symptoms related to her diagnosis. [name] arrived ten minutes late for her appointment, and reported she needed to leave at 10:30 to make it to a medical appointment. [name] spoke about her daughter, and how she feels she failed her by allowing a man to be in her life that, "was clearly obsessed with her." [name] also reports she confronted her boyfriend about the photos she found on his phone, and reports feeling, "a lot of guilt and shame about that." [name] also reported a recent conflict with her best friend. [name] was tearful and visibly anxious during her session. She is in agreement with continuing weekly therapy, and also asked if I could provide her with [facility] resources.  I will have those resources for her at her next session.   Suicidal/Homicidal: NAwithout intent/plan  Therapist Response: [name] will continue working on her emotional regulation, and processing her feelings of guilt and shame as they relate to her current symptoms.   Plan: Return again in 1 weeks.  Diagnosis: Axis I: Generalized Anxiety Disorder and Post Traumatic Stress Disorder    Axis II: No diagnosis    Heidi DachKelsey Emmilyn Crooke, LCSW 11/30/2017

## 2017-11-30 NOTE — Progress Notes (Signed)
Melodie BouillonVarnita Tahiliani, MD [address]  [address]  [place], [place] [address]  Main: [phone]  Fax: [phone]   Primary Care Physician: McLean-Scocuzza, Pasty Spillersracy N, MD  Primary Gastroenterologist:  Dr. Melodie BouillonVarnita Tahiliani  Chief Complaint  Patient presents with  . Follow-up    Elevated Liver Enzymes, IDA, Dysphagia    HPI: Cheyenne Gray is a 52 y.o. female here for follow-up of iron deficiency anemia.  Patient was scheduled for EGD and colonoscopy, but has canceled her appointment twice.  She continues to complain of intermittent dysphagia, both with liquids and solids.  However, reporting that it is happening less frequently, about once a month.  No weight loss.  Still has not gone to the dentist to get new dentures, however, reports that she is chewing her food well.  Reports history of dysphagia for years.  no hematochezia or melena.  No abdominal pain.  Denies any hepatotoxic drugs including alcohol or herbal products or over-the-counter products.  Previous history: Reports an ER visit last year due to food impaction.  ER notes reviewed, did reveal the patient was given glucagon, and felt better, and was discharged from the ER.  She had an EGD in 2015 due to melena, anemia and abdominal pain.  This was done by Dr. Dow AdolphMatthew Rein.  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  Patient was given sucralfate.  Stomach biopsies did not reveal H. Pylori.  Reports history of colonoscopy prior to her gastric bypass 15 or 16 years ago as well.  No recent colonoscopies.  No immediate family history of colon cancer.  Reports alternating constipation and diarrhea.  States has some days with hard balls of stool, and others with loose stools.  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  albuterol (PROVENTIL HFA;VENTOLIN HFA) 108 (90 BASE) MCG/ACT inhaler Inhale 2 puffs into the lungs as needed for wheezing.    . Black Cohosh 175 MG CAPS Take 1 capsule by mouth daily.    . busPIRone (BUSPAR) 15 MG tablet Take 1 tablet (15 mg total) by mouth 2 (two) times daily. 180 tablet 0  . DULoxetine (CYMBALTA) 30 MG capsule TAKE 1 CAPSULE BY MOUTH EVERY DAY 90 capsule 2  . gabapentin (NEURONTIN) 400 MG capsule Limit 2 tablets in the a.m. and midday and 3 tablets each evening    Please dispense a three-month supply 630 capsule 0  . ibuprofen (ADVIL,MOTRIN) 600 MG tablet Take 1 tablet (600 mg total) by mouth every 6 (six) hours as needed. 30 tablet 0  . isosorbide mononitrate (IMDUR) 30 MG 24 hr tablet Take 1 tablet (30 mg total) by mouth daily. 90 tablet 4  . methadone (DOLOPHINE) 10 MG tablet Limit 1-2 tablets by mouth 2-3 times per day if tolerated 180 tablet 0  . mirtazapine (REMERON) 15 MG tablet Take 1 tablet (15 mg total) by mouth at bedtime. For sleep, mood 90 tablet 0  . rOPINIRole (REQUIP) 0.25 MG tablet Take 0.5-0.75 mg by mouth at bedtime.     . rosuvastatin (CRESTOR) 10 MG tablet Take 1 tablet (10 mg total) by mouth daily. 90 tablet 4  . SUMAtriptan (IMITREX) 100 MG tablet TAKE 1 TABLET BY MOUTH AS NEEDED FOR HEADACHE. MAY REPEAT IN 2 HRS IF NEEDED. MAX 200MG /DAY  5  . SUMAtriptan (IMITREX) 50  MG tablet TAKE 1 TABLET BY MOUTH STAT, MAY REPEAT IN 2 HOURS, MAX 200MG  PER DAY    . vitamin B-12 (CYANOCOBALAMIN) 500 MCG tablet Take 1 tablet (500 mcg total) by mouth daily. 30 tablet 6  . nitroGLYCERIN (NITROSTAT) 0.4 MG SL tablet Place 1 tablet (0.4 mg total) under the tongue every 5 (five) minutes as needed for chest pain. 25 tablet 3   Current Facility-Administered Medications  Medication Dose Route Frequency Provider Last Rate Last Dose  . bupivacaine (PF) (MARCAINE) 0.25 % injection 30 mL  30 mL Other Once Ewing Schlein, MD      . bupivacaine (PF) (MARCAINE) 0.25 % injection 30 mL   30 mL Other Once Ewing Schlein, MD      . fentaNYL (SUBLIMAZE) injection 100 mcg  100 mcg Intravenous Once Ewing Schlein, MD      . fentaNYL (SUBLIMAZE) injection 100 mcg  100 mcg Intravenous Once Ewing Schlein, MD      . lactated ringers infusion 1,000 mL  1,000 mL Intravenous Continuous Ewing Schlein, MD      . lactated ringers infusion 1,000 mL  1,000 mL Intravenous Continuous Ewing Schlein, MD      . lactated ringers infusion 1,000 mL  1,000 mL Intravenous Continuous Ewing Schlein, MD 125 mL/hr at 08/06/15 1002 1,000 mL at 08/06/15 1002  . lidocaine (PF) (XYLOCAINE) 1 % injection 10 mL  10 mL Subcutaneous Once Ewing Schlein, MD      . midazolam (VERSED) 5 MG/5ML injection 5 mg  5 mg Intravenous Once Ewing Schlein, MD      . orphenadrine (NORFLEX) injection 60 mg  60 mg Intramuscular Once Ewing Schlein, MD      . orphenadrine (NORFLEX) injection 60 mg  60 mg Intramuscular Once Ewing Schlein, MD      . triamcinolone acetonide (KENALOG-40) injection 40 mg  40 mg Other Once Ewing Schlein, MD      . triamcinolone acetonide [facility]) injection 40 mg  40 mg Other Once Ewing Schlein, MD        Allergies as of 11/30/2017 - Review Complete 11/30/2017  Allergen Reaction Noted  . Penicillins Hives, Shortness Of Breath, and Rash 07/20/2009    ROS:  General: Negative for anorexia, weight loss, fever, chills, fatigue, weakness. ENT: Negative for hoarseness, difficulty swallowing , nasal congestion. CV: Negative for chest pain, angina, palpitations, dyspnea on exertion, peripheral edema.  Respiratory: Negative for dyspnea at rest, dyspnea on exertion, cough, sputum, wheezing.  GI: See history of present illness. GU:  Negative for dysuria, hematuria, urinary incontinence, urinary frequency, nocturnal urination.  Endo: Negative for unusual weight change.    Physical Examination:   BP 117/75   Pulse 61   Ht 5\' 6"  (1.676 m)   Wt 224 lb 12.8 oz (102 kg)   LMP 02/18/2016 (Approximate)  Comment: neg HCG-03/26/2016  BMI 36.28 kg/m   General: Well-nourished, well-developed in no acute distress.  Eyes: No icterus. Conjunctivae pink. Mouth: Oropharyngeal mucosa moist and pink , no lesions erythema or exudate. Neck: Supple, Trachea midline Abdomen: Bowel sounds are normal, nontender, nondistended, no hepatosplenomegaly or masses, no abdominal bruits or hernia , no rebound or guarding.   Extremities: No lower extremity edema. No clubbing or deformities. Neuro: Alert and oriented x 3.  Grossly intact. Skin: Warm and dry, no jaundice.   Psych: Alert and cooperative, normal mood and affect.   Labs: CMP     Component Value Date/Time   NA 139 11/02/2017 1029   NA  142 03/28/2016 1519   K 3.4 (L) 11/02/2017 1029   CL 100 11/02/2017 1029   CO2 28 11/02/2017 1029   GLUCOSE 125 (H) 11/02/2017 1029   BUN 7 11/02/2017 1029   BUN 5 (L) 03/28/2016 1519   CREATININE 0.86 11/02/2017 1029   CREATININE 0.86 08/18/2017 0954   CALCIUM 9.0 11/02/2017 1029   PROT 7.1 11/02/2017 1029   PROT 6.6 10/02/2016 1037   PROT 6.4 05/04/2013 1556   ALBUMIN 3.7 11/02/2017 1029   ALBUMIN 3.7 10/02/2016 1037   ALBUMIN 3.1 (L) 05/04/2013 1556   AST 50 (H) 11/02/2017 1029   AST 38 (H) 05/04/2013 1556   ALT 32 11/02/2017 1029   ALT 39 05/04/2013 1556   ALKPHOS 118 11/02/2017 1029   ALKPHOS 84 05/04/2013 1556   BILITOT 0.7 11/02/2017 1029   BILITOT 0.3 10/02/2016 1037   BILITOT 0.3 05/04/2013 1556   GFRNONAA >60 11/02/2017 1029   GFRNONAA 79 08/18/2017 0954   GFRAA >60 11/02/2017 1029   GFRAA 92 08/18/2017 0954   Lab Results  Component Value Date   WBC 6.8 11/02/2017   HGB 13.0 11/02/2017   HCT 38.4 11/02/2017   MCV 90.4 11/02/2017   PLT 191 11/02/2017    Imaging Studies: Dg Wrist Complete Left  Result Date: 11/24/2017 CLINICAL DATA:  Injury to the wrist EXAM: LEFT WRIST - COMPLETE 3+ VIEW COMPARISON:  None. FINDINGS: There is no evidence of fracture or dislocation. There is no  evidence of arthropathy or other focal bone abnormality. Soft tissues are unremarkable. IMPRESSION: Negative. Electronically Signed   By: Jasmine Pang M.D.   On: 11/24/2017 23:26   Dg Hand Complete Left  Result Date: 11/25/2017 CLINICAL DATA:  Left hand pain after being hit by blinds that fell down. Metacarpal tenderness. EXAM: LEFT HAND - COMPLETE 3+ VIEW COMPARISON:  Wrist radiograph yesterday FINDINGS: There is no evidence of fracture or dislocation. There is no evidence of arthropathy or other focal bone abnormality. Soft tissues are unremarkable. IMPRESSION: Negative radiographs of the left hand.  No fracture. Electronically Signed   By: Narda Rutherford M.D.   On: 11/25/2017 04:44    Assessment and Plan:   JAZMIN LEY is a 52 y.o. y/o female here for history of iron deficiency anemia, and has canceled her appointments for endoscopy twice  Importance of getting her EGD and colonoscopy scheduled and done discussed in detail and she verbalized understanding Colonoscopies both for screening and iron deficiency anemia EGDs for iron deficiency anemia and dysphagia However, her iron deficiency is likely due to underlying gastric bypass  Patient is receiving IV iron transfusions  On previous visit hepatic blood work was ordered and patient did not get this done.  I have asked her to get this done at this time. Previously, hep C antibody, hep B surface antigen, hep B surface antibody were negative, serum copper is normal  Continue to avoid hepatotoxic drugs Diet, weight loss, and exercise encouraged as elevated liver enzymes are likely due to underlying fatty liver. ultrasound was normal in July 2019.  Patient also asked to get her dentures changed and see her dentist soon for this, as it would allow her to chew her food well and she verbalized understanding.  Continues to drink a lot of soda and was asked to cut this down a second need to loose stools as well that she reports  intermittently.  Can obtain biopsies to rule out microscopic colitis at the time of the colonoscopy.  If  EGD is unrevealing of any causes for her dysphagia, manometry would be indicated at that time and this was discussed with her as well.  I have discussed alternative options, risks & benefits,  which include, but are not limited to, bleeding, infection, perforation,respiratory complication & drug reaction.  The patient agrees with this plan & written consent will be obtained.     Dr Melodie Bouillon

## 2017-12-01 ENCOUNTER — Other Ambulatory Visit: Payer: Self-pay

## 2017-12-01 ENCOUNTER — Telehealth: Payer: Self-pay

## 2017-12-01 MED ORDER — PEG-KCL-NACL-NASULF-NA ASC-C 140 G PO SOLR: 1.0000 | Freq: Once | ORAL | 0 refills | Status: AC

## 2017-12-01 NOTE — Telephone Encounter (Signed)
[place]  [phone]

## 2017-12-01 NOTE — Telephone Encounter (Signed)
received fax that they have called patient twice and left wvoicemails with no response. they states they will attempt one more time

## 2017-12-01 NOTE — Telephone Encounter (Signed)
EGD/Colonoscopy scheduled 12/23/2017.

## 2017-12-01 NOTE — Telephone Encounter (Signed)
Thanks

## 2017-12-02 ENCOUNTER — Other Ambulatory Visit: Payer: Self-pay

## 2017-12-02 DIAGNOSIS — R1319 Other dysphagia: Secondary | ICD-10-CM

## 2017-12-02 DIAGNOSIS — Z1211 Encounter for screening for malignant neoplasm of colon: Secondary | ICD-10-CM

## 2017-12-02 DIAGNOSIS — R1310 Dysphagia, unspecified: Secondary | ICD-10-CM

## 2017-12-03 ENCOUNTER — Other Ambulatory Visit: Payer: Self-pay | Admitting: Cardiovascular Disease

## 2017-12-10 ENCOUNTER — Ambulatory Visit: Payer: Medicare Other | Admitting: Licensed Clinical Social Worker

## 2017-12-15 DIAGNOSIS — Z0271 Encounter for disability determination: Secondary | ICD-10-CM

## 2017-12-21 ENCOUNTER — Encounter: Payer: Self-pay | Admitting: Psychiatry

## 2017-12-21 ENCOUNTER — Ambulatory Visit (INDEPENDENT_AMBULATORY_CARE_PROVIDER_SITE_OTHER): Payer: Medicare Other | Admitting: Psychiatry

## 2017-12-21 VITALS — BP 125/80 | HR 76 | Temp 98.2°F | Wt 206.4 lb

## 2017-12-21 DIAGNOSIS — G4700 Insomnia, unspecified: Secondary | ICD-10-CM

## 2017-12-21 DIAGNOSIS — I2510 Atherosclerotic heart disease of native coronary artery without angina pectoris: Secondary | ICD-10-CM

## 2017-12-21 DIAGNOSIS — F1120 Opioid dependence, uncomplicated: Secondary | ICD-10-CM

## 2017-12-21 DIAGNOSIS — F1590 Other stimulant use, unspecified, uncomplicated: Secondary | ICD-10-CM

## 2017-12-21 DIAGNOSIS — F411 Generalized anxiety disorder: Secondary | ICD-10-CM

## 2017-12-21 DIAGNOSIS — F4310 Post-traumatic stress disorder, unspecified: Secondary | ICD-10-CM

## 2017-12-21 DIAGNOSIS — F331 Major depressive disorder, recurrent, moderate: Secondary | ICD-10-CM | POA: Diagnosis not present

## 2017-12-21 MED ORDER — TRAZODONE HCL 100 MG PO TABS: 50.0000 mg | ORAL_TABLET | Freq: Every evening | ORAL | 0 refills | Status: DC | PRN

## 2017-12-21 MED ORDER — DULOXETINE HCL 60 MG PO CPEP: 60.0000 mg | ORAL_CAPSULE | Freq: Every day | ORAL | 0 refills | Status: DC

## 2017-12-21 NOTE — Patient Instructions (Signed)
Trazodone tablets What is this medicine? TRAZODONE (TRAZ oh done) is used to treat depression. This medicine may be used for other purposes; ask your health care provider or pharmacist if you have questions. COMMON BRAND NAME(S): Desyrel What should I tell my health care provider before I take this medicine? They need to know if you have any of these conditions: -attempted suicide or thinking about it -bipolar disorder -bleeding problems -glaucoma -heart disease, or previous heart attack -irregular heart beat -kidney or liver disease -low levels of sodium in the blood -an unusual or allergic reaction to trazodone, other medicines, foods, dyes or preservatives -pregnant or trying to get pregnant -breast-feeding How should I use this medicine? Take this medicine by mouth with a glass of water. Follow the directions on the prescription label. Take this medicine shortly after a meal or a light snack. Take your medicine at regular intervals. Do not take your medicine more often than directed. Do not stop taking this medicine suddenly except upon the advice of your doctor. Stopping this medicine too quickly may cause serious side effects or your condition may worsen. A special MedGuide will be given to you by the pharmacist with each prescription and refill. Be sure to read this information carefully each time. Talk to your pediatrician regarding the use of this medicine in children. Special care may be needed. Overdosage: If you think you have taken too much of this medicine contact a poison control center or emergency room at once. NOTE: This medicine is only for you. Do not share this medicine with others. What if I miss a dose? If you miss a dose, take it as soon as you can. If it is almost time for your next dose, take only that dose. Do not take double or extra doses. What may interact with this medicine? Do not take this medicine with any of the following medications: -certain medicines  for fungal infections like fluconazole, itraconazole, ketoconazole, posaconazole, voriconazole -cisapride -dofetilide -dronedarone -linezolid -MAOIs like Carbex, Eldepryl, Marplan, Nardil, and Parnate -mesoridazine -methylene blue (injected into a vein) -pimozide -saquinavir -thioridazine -ziprasidone This medicine may also interact with the following medications: -alcohol -antiviral medicines for HIV or AIDS -aspirin and aspirin-like medicines -barbiturates like phenobarbital -certain medicines for blood pressure, heart disease, irregular heart beat -certain medicines for depression, anxiety, or psychotic disturbances -certain medicines for migraine headache like almotriptan, eletriptan, frovatriptan, naratriptan, rizatriptan, sumatriptan, zolmitriptan -certain medicines for seizures like carbamazepine and phenytoin -certain medicines for sleep -certain medicines that treat or prevent blood clots like dalteparin, enoxaparin, warfarin -digoxin -fentanyl -lithium -NSAIDS, medicines for pain and inflammation, like ibuprofen or naproxen -other medicines that prolong the QT interval (cause an abnormal heart rhythm) -rasagiline -supplements like St. John's wort, kava kava, valerian -tramadol -tryptophan This list may not describe all possible interactions. Give your health care provider a list of all the medicines, herbs, non-prescription drugs, or dietary supplements you use. Also tell them if you smoke, drink alcohol, or use illegal drugs. Some items may interact with your medicine. What should I watch for while using this medicine? Tell your doctor if your symptoms do not get better or if they get worse. Visit your doctor or health care professional for regular checks on your progress. Because it may take several weeks to see the full effects of this medicine, it is important to continue your treatment as prescribed by your doctor. Patients and their families should watch out for new  or worsening thoughts of suicide or depression. Also   watch out for sudden changes in feelings such as feeling anxious, agitated, panicky, irritable, hostile, aggressive, impulsive, severely restless, overly excited and hyperactive, or not being able to sleep. If this happens, especially at the beginning of treatment or after a change in dose, call your health care professional. You may get drowsy or dizzy. Do not drive, use machinery, or do anything that needs mental alertness until you know how this medicine affects you. Do not stand or sit up quickly, especially if you are an older patient. This reduces the risk of dizzy or fainting spells. Alcohol may interfere with the effect of this medicine. Avoid alcoholic drinks. This medicine may cause dry eyes and blurred vision. If you wear contact lenses you may feel some discomfort. Lubricating drops may help. See your eye doctor if the problem does not go away or is severe. Your mouth may get dry. Chewing sugarless gum, sucking hard candy and drinking plenty of water may help. Contact your doctor if the problem does not go away or is severe. What side effects may I notice from receiving this medicine? Side effects that you should report to your doctor or health care professional as soon as possible: -allergic reactions like skin rash, itching or hives, swelling of the face, lips, or tongue -elevated mood, decreased need for sleep, racing thoughts, impulsive behavior -confusion -fast, irregular heartbeat -feeling faint or lightheaded, falls -feeling agitated, angry, or irritable -loss of balance or coordination -painful or prolonged erections -restlessness, pacing, inability to keep still -suicidal thoughts or other mood changes -tremors -trouble sleeping -seizures -unusual bleeding or bruising Side effects that usually do not require medical attention (report to your doctor or health care professional if they continue or are bothersome): -change in  sex drive or performance -change in appetite or weight -constipation -headache -muscle aches or pains -nausea This list may not describe all possible side effects. Call your doctor for medical advice about side effects. You may report side effects to FDA at 1-800-FDA-1088. Where should I keep my medicine? Keep out of the reach of children. Store at room temperature between 15 and 30 degrees C (59 to 86 degrees F). Protect from light. Keep container tightly closed. Throw away any unused medicine after the expiration date. NOTE: This sheet is a summary. It may not cover all possible information. If you have questions about this medicine, talk to your doctor, pharmacist, or health care provider.  2018 Elsevier/Gold Standard (2015-08-02 16:57:05)  

## 2017-12-21 NOTE — Progress Notes (Signed)
[facility] MD  OP Progress Note  12/21/2017 1:47 PM Cheyenne Gray  MRN:  [id number]  Chief Complaint: ' I am here for follow up." Chief Complaint    Follow-up; Medication Refill     HPI: Cheyenne Gray is a 52 year old Caucasian female, widowed, on disability, has a history of depression, anxiety, multiple medical problems like heart failure, vitamin B12 deficiency, history of prolonged QT, chronic back pain on methadone, vitamin D deficiency, iron deficiency, history of gastric bypass, non-ST elevation MI, hypertension, presented to the clinic today for a follow-up visit.  Patient today reports that she stopped taking the mirtazapine since she had some side effects.  She reports she had some increased dizziness and falls after starting the mirtazapine.   She is interested in another sleep aid today.  Discussed trazodone.  She agrees with plan.  Patient continues to take the Cymbalta as prescribed.  She however continues to struggle with some anxiety and depressive symptoms.  She denies any suicidality or homicidality.  Patient is tolerating the BuSpar well.  Patient continues to have several psychosocial stressors including relationship stressors, being widowed, financial problems, recent relocation to a new home and so on.  Patient has been referred for psychotherapy and advised to follow-up with her therapist on a regular basis.  Patient also reports taking a lot of caffeine on a daily basis.  She reports she drinks 6-7 cans of Coca[employer] daily.  Discussed at length about the risk of taking more caffeine and how it can have an impact on her sleep as well as anxiety.  Patient agrees to cut down.  Patient will continue to follow-up with her primary medical doctor as well as pain provider.  She has not been able to get her ADHD testing done.  Discussed with patient to contact her health insurance to get a list of places/psychologist who can do testing since the one that we referred her to , she has some trouble  paying the co-pay. Visit Diagnosis:    ICD-10-CM   1. PTSD (post-traumatic stress disorder) F43.10   2. GAD (generalized anxiety disorder) F41.1 DULoxetine (CYMBALTA) 60 MG capsule  3. MDD (major depressive disorder), recurrent episode, moderate (HCC) F33.1   4. Insomnia, unspecified type G47.00 traZODone (DESYREL) 100 MG tablet  5. 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 Opioid dependence in controlled environment [facility]) F11.20     Past Psychiatric History: Reviewed past psychiatric history from my progress note on 11/06/2017.  Past trials of Paxil, Cymbalta, Valium, melatonin  Past Medical History:  Past Medical History:  Diagnosis Date  . (HFpEF) heart failure with preserved ejection fraction ([place])    a. Echo 2014: EF 65-70%, nl WM, mildly dilated LA, PASP nl; b. 12/2014 Echo: EF 65-70%, no rwma, mod septal hypertrophy w/o LVOT gradient or SAM; c. 07/2017 Echo: EF 55-60%, no rwma, mildly dil RV w/ nl syst fxn. Mildly dil RA. Dilated IVC w/ elevated CVP. Triv post effusion.  . (HFpEF) heart failure with preserved ejection fraction ([place])   . Anxiety   . Arthritis   . Asthma   . B12 deficiency   . Chronic back pain   . Chronic headaches   . Chronic pain    a. on methadone  . Concussion    hx of 4  . Coronary artery disease, non-occlusive    a. LHC 1/18: proximal to mid LAD 40% stenosed, mid LAD 30% stenosed, mid RCA 20% stenosed, distal RCA 20% stenosed, EF 55-65%, LVEDP normal  . Depression   .  DJD (degenerative joint disease), multiple sites   . Frequent headaches   . Gallstone   . H/O non-ST elevation myocardial infarction (NSTEMI)   . Hand, foot and mouth disease 2016  . History of shingles   . Hypertension   . Iron deficiency anemia   . Long QT interval   . Long QT interval   . Methadone use ([place])    managed by Dr. Primus Bravo  . Migraine   . Obesity   . Palpitations    a. 24 hour Holter: NSR, sinus brady down to 48, occasional PVCs & couplets, 8 beats NSVT; b. 30 day event  monitor 2015: NSR with rare PVC.  Marland Kitchen Psoriasis   . Syncope and collapse   . Vitamin D deficiency     Past Surgical History:  Procedure Laterality Date  . ABDOMINOPLASTY     tummy tuck ? year   . [place] SURGERY  2001  . CARDIAC CATHETERIZATION Left 04/16/2016   Procedure: Left Heart Cath and Coronary Angiography;  Surgeon: [name], MD;  Location: [facility];  Service: Cardiovascular;  Laterality: Left;  . CHOLECYSTECTOMY  2001  . GALLBLADDER SURGERY    . GASTRIC BYPASS  2001  . GASTROPLASTY      Family Psychiatric History: Reviewed family psychiatric history from my progress note on 11/06/2017  Family History:  Family History  Problem Relation Age of Onset  . Heart attack Mother   . Cancer Mother        pancreatitic   . Early death Mother   . Heart attack Father 72       MI  . Early death Father   . Heart disease Father   . Heart attack Brother   . Heart disease Brother   . Arthritis Brother   . Depression Brother   . Diabetes Brother   . Heart attack Maternal Grandmother   . Cancer Maternal Grandmother        pancreatitic   . Heart disease Maternal Grandmother   . Cancer Maternal Uncle        pancreatitic   . Cancer Paternal Grandmother        ? type   . Diabetes Paternal Grandmother   . Cancer Maternal Uncle        pancreatitic   . Breast cancer Maternal Aunt     Social History: Reviewed social history from my progress note on 11/06/2017 Social History   Socioeconomic History  . Marital status: Widowed    Spouse name: Not on file  . Number of children: 1  . Years of education: assoc degree  . Highest education level: Associate degree: occupational, Hotel manager, or vocational program  Occupational History  . Not on file  Social Needs  . Financial resource strain: Very hard  . Food insecurity:    Worry: Never true    Inability: Never true  . Transportation needs:    Medical: No    Non-medical: No  Tobacco Use  . Smoking status:  Never Smoker  . Smokeless tobacco: Never Used  Substance and Sexual Activity  . Alcohol use: No    Alcohol/week: 0.0 standard drinks    Comment: holidays  . Drug use: No  . Sexual activity: Not Currently  Lifestyle  . Physical activity:    Days per week: 0 days    Minutes per session: 0 min  . Stress: Very much  Relationships  . Social connections:    Talks on phone: Once a week  Gets together: Never    Attends religious service: Never    Active member of club or organization: No    Attends meetings of clubs or organizations: Never    Relationship status: Widowed  Other Topics Concern  . 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 Penicillins Hives, Shortness Of Breath and Rash    Has patient had a PCN reaction causing immediate rash, facial/tongue/throat swelling, SOB or lightheadedness with hypotension: Yes Has patient had a PCN reaction causing severe rash involving mucus membranes or skin necrosis: No Has patient had a PCN reaction that required hospitalization No Has patient had a PCN reaction occurring within the last 10 years: No If all of the above answers are "NO", then may proceed with Cephalosporin use.     Metabolic Disorder Labs: No results found for: HGBA1C, MPG No results found for: PROLACTIN Lab Results  Component Value Date   CHOL 114 10/02/2016   TRIG 59 10/02/2016   HDL 55 10/02/2016   CHOLHDL 2.1 10/02/2016   VLDL 14 01/30/2015   LDLCALC 47 10/02/2016   LDLCALC 94 01/30/2015   Lab Results  Component Value Date   TSH 3.037 07/26/2017    Therapeutic Level Labs: No results found for: LITHIUM No results found for: VALPROATE No components found for:  CBMZ  Current Medications: Current Outpatient Medications   Medication Sig Dispense Refill  . albuterol (PROVENTIL HFA;VENTOLIN HFA) 108 (90 BASE) MCG/ACT inhaler Inhale 2 puffs into the lungs as needed for wheezing.    . Black Cohosh 175 MG CAPS Take 1 capsule by mouth daily.    . busPIRone (BUSPAR) 15 MG tablet Take 1 tablet (15 mg total) by mouth 2 (two) times daily. 180 tablet 0  . gabapentin (NEURONTIN) 400 MG capsule Limit 2 tablets in the a.m. and midday and 3 tablets each evening    Please dispense a three-month supply 630 capsule 0  . ibuprofen (ADVIL,MOTRIN) 600 MG tablet Take 1 tablet (600 mg total) by mouth every 6 (six) hours as needed. 30 tablet 0  . isosorbide mononitrate (IMDUR) 30 MG 24 hr tablet Take 1 tablet (30 mg total) by mouth daily. 90 tablet 4  . methadone (DOLOPHINE) 10 MG tablet Limit 1-2 tablets by mouth 2-3 times per day if tolerated 180 tablet 0  . rOPINIRole (REQUIP) 0.25 MG tablet Take 0.5-0.75 mg by mouth at bedtime.     . rosuvastatin (CRESTOR) 10 MG tablet TAKE 1 TABLET BY MOUTH EVERY DAY 90 tablet 1  . SUMAtriptan (IMITREX) 100 MG tablet TAKE 1 TABLET BY MOUTH AS NEEDED FOR HEADACHE. MAY REPEAT IN 2 HRS IF NEEDED. MAX 200MG/DAY  5  . SUMAtriptan (IMITREX) 50 MG tablet TAKE 1 TABLET BY MOUTH STAT, MAY REPEAT IN 2 HOURS, MAX 200MG PER DAY    . vitamin B-12 (CYANOCOBALAMIN) 500 MCG tablet Take 1 tablet (500 mcg total) by mouth daily. 30 tablet 6  . DULoxetine (CYMBALTA) 60 MG capsule Take 1 capsule (60 mg total) by mouth daily. 90 capsule 0  . nitroGLYCERIN (NITROSTAT) 0.4 MG SL tablet Place 1 tablet (0.4 mg total) under the tongue every 5 (five) minutes as needed for chest  pain. 25 tablet 3  . traZODone (DESYREL) 100 MG tablet Take 0.5-1 tablets (50-100 mg total) by mouth at bedtime as needed for sleep. 30 tablet 0   Current Facility-Administered Medications  Medication Dose Route Frequency Provider Last Rate Last Dose  . bupivacaine (PF) (MARCAINE) 0.25 % injection 30 mL  30 mL Other Once Mohammed Kindle, MD      .  bupivacaine (PF) (MARCAINE) 0.25 % injection 30 mL  30 mL Other Once Mohammed Kindle, MD      . fentaNYL (SUBLIMAZE) injection 100 mcg  100 mcg Intravenous Once Mohammed Kindle, MD      . fentaNYL (SUBLIMAZE) injection 100 mcg  100 mcg Intravenous Once Mohammed Kindle, MD      . lactated ringers infusion 1,000 mL  1,000 mL Intravenous Continuous Mohammed Kindle, MD      . lactated ringers infusion 1,000 mL  1,000 mL Intravenous Continuous Mohammed Kindle, MD      . lactated ringers infusion 1,000 mL  1,000 mL Intravenous Continuous Mohammed Kindle, MD 125 mL/hr at 08/06/15 1002 1,000 mL at 08/06/15 1002  . lidocaine (PF) (XYLOCAINE) 1 % injection 10 mL  10 mL Subcutaneous Once Mohammed Kindle, MD      . midazolam (VERSED) 5 MG/5ML injection 5 mg  5 mg Intravenous Once Mohammed Kindle, MD      . orphenadrine (NORFLEX) injection 60 mg  60 mg Intramuscular Once Mohammed Kindle, MD      . orphenadrine (NORFLEX) injection 60 mg  60 mg Intramuscular Once Mohammed Kindle, MD      . triamcinolone acetonide (KENALOG-40) injection 40 mg  40 mg Other Once Mohammed Kindle, MD      . triamcinolone acetonide (KENALOG-40) injection 40 mg  40 mg Other Once Mohammed Kindle, MD         Musculoskeletal: Strength & Muscle Tone: within normal limits Gait & Station: normal Patient leans: N/A  Psychiatric Specialty Exam: Review of Systems  Psychiatric/Behavioral: The patient is nervous/anxious and has insomnia.   All other systems reviewed and are negative.   Blood pressure 125/80, pulse 76, temperature 98.2 F (36.8 C), temperature source Oral, weight 206 lb 6.4 oz (93.6 kg), last menstrual period 02/18/2016.Body mass index is 33.31 kg/m.  General Appearance: Casual  Eye Contact:  Fair  Speech:  Clear and Coherent  Volume:  Normal  Mood:  Anxious  Affect:  Congruent  Thought Process:  Goal Directed and Descriptions of Associations: Intact  Orientation:  Full (Time, Place, and Person)  Thought Content: Logical    Suicidal Thoughts:  No  Homicidal Thoughts:  No  Memory:  Immediate;   Fair Recent;   Fair Remote;   Fair  Judgement:  Fair  Insight:  Fair  Psychomotor Activity:  Restlessness  Concentration:  Concentration: Fair and Attention Span: Fair  Recall:  [employer] of Knowledge: Fair  Language: Fair  Akathisia:  No  Handed:  Right  AIMS (if indicated): na  Assets:  Communication Skills Desire for Improvement Social Support  ADL's:  Intact  Cognition: WNL  Sleep:  Poor   Screenings: GAD-7     Office Visit from 09/21/2015 in [facility]  Total GAD-7 Score  4    Mini-Mental     Office Visit from 10/30/2017 in [facility]  Total Score (max 30 points )  27    PHQ2-9     Office Visit from 08/18/2017 in [facility] Office Visit  from 11/15/2015 in [place] Office Visit from 10/18/2015 in [place] Office Visit from 09/21/2015 in [place] Office Visit from 07/25/2015 in [facility]  PHQ-2 Total Score  0  0  0  0  0       Assessment and Plan: Cheyenne Gray is a 52 year old Caucasian female, widowed, has a history of depression, anxiety, multiple medical problems including coronary artery disease, history of NSTEMI, long QT per history, gastric bypass, vitamin D, vitamin B12 deficiency, chronic pain on methadone, presented to the clinic today for a follow-up visit.  Patient is biologically predisposed given her history of medical problems, trauma as well as family history of mental health problems.  She also has psychosocial stressors of being widowed, several deaths in the family, multiple health problems, relationship stressors, recent relocation and so on.  Patient will benefit from continued medication management as well as  psychotherapy.  Plan For PTSD Increase Cymbalta to 60 mg p.o. daily Continue BuSpar 15 mg p.o. twice daily Continue psychotherapy with our therapist Ms. Alden Hipp.  For MDD Cymbalta 60 mg p.o. Daily Discontinue Remeron for side effects.  For GAD Cymbalta 60 mg p.o. daily BuSpar 15 mg p.o. twice daily  For insomnia Start trazodone 50-100 mg p.o. nightly. Discussed sleep hygiene  For caffeine use disorder Discussed with patient to cut down.  Memory problems MMSE was completed on 11/06/2017-30 out of 30. Patient also has vitamin B12 deficiency which is currently being managed by her primary medical doctor.  We will continue to monitor closely.  Attention and concentration problems Referred patient for ADHD testing.  Opiate dependence in controlled environment on methadone Patient will continue to follow-up with her pain provider  Follow up in clinic in 2 weeks or sooner if needed.  More than 50 % of the time was spent for psychoeducation and supportive psychotherapy and care coordination. This note was generated in part or whole with voice recognition software. Voice recognition is usually quite accurate but there are transcription errors that can and very often do occur. I apologize for any typographical errors that were not detected and corrected.        Ursula Alert, MD 12/21/2017, 1:47 PM

## 2017-12-23 ENCOUNTER — Encounter: Payer: Self-pay | Admitting: Anesthesiology

## 2017-12-23 ENCOUNTER — Encounter: Admission: RE | Payer: Self-pay | Source: Ambulatory Visit

## 2017-12-23 ENCOUNTER — Ambulatory Visit: Admission: RE | Admit: 2017-12-23 | Payer: Medicare Other | Source: Ambulatory Visit | Admitting: Gastroenterology

## 2017-12-23 DIAGNOSIS — Z1211 Encounter for screening for malignant neoplasm of colon: Secondary | ICD-10-CM

## 2017-12-23 SURGERY — COLONOSCOPY WITH PROPOFOL
Anesthesia: General

## 2017-12-25 NOTE — Progress Notes (Signed)
This encounter was created in error - please disregard.

## 2017-12-28 ENCOUNTER — Telehealth: Payer: Self-pay | Admitting: Licensed Clinical Social Worker

## 2017-12-28 ENCOUNTER — Ambulatory Visit: Payer: Medicare Other | Admitting: Licensed Clinical Social Worker

## 2017-12-28 NOTE — Telephone Encounter (Signed)
Haidynn called and left a voicemail asking me to call her back. I called her at 12PM and spoke with her. She reported being in pain and not being able to to make it to her 10AM appointment. She asked to reschedule, so I directed her to contact Le[facility] to schedule her next appointment.   Heidi Dach, MSW, LCSW Clinical Social Worker 12/28/2017 12:20 PM

## 2017-12-29 ENCOUNTER — Ambulatory Visit: Payer: Medicare PPO | Admitting: Internal Medicine

## 2017-12-29 DIAGNOSIS — Z0289 Encounter for other administrative examinations: Secondary | ICD-10-CM

## 2018-01-04 ENCOUNTER — Ambulatory Visit: Payer: Medicare Other | Admitting: Psychiatry

## 2018-01-04 ENCOUNTER — Ambulatory Visit: Payer: Medicare Other | Admitting: Licensed Clinical Social Worker

## 2018-01-13 ENCOUNTER — Encounter: Payer: Self-pay | Admitting: Licensed Clinical Social Worker

## 2018-01-13 ENCOUNTER — Ambulatory Visit (INDEPENDENT_AMBULATORY_CARE_PROVIDER_SITE_OTHER): Payer: Medicare Other | Admitting: Licensed Clinical Social Worker

## 2018-01-13 ENCOUNTER — Ambulatory Visit: Payer: Medicare Other | Admitting: Psychiatry

## 2018-01-13 DIAGNOSIS — F4310 Post-traumatic stress disorder, unspecified: Secondary | ICD-10-CM

## 2018-01-13 NOTE — Progress Notes (Signed)
   THERAPIST PROGRESS NOTE  Session Time: 1100  Participation Level: Active  Behavioral Response: CasualAlertDepressed  Type of Therapy: Individual Therapy  Treatment Goals addressed: Coping  Interventions: CBT  Summary: Cheyenne Gray is a 52 y.o. female who presents with depression and PTSD. Cheyenne Gray reports feeling frustrated as she was not able to see the doctor today as she had planned. She was able to recognize that her next appointment was next week, and she was able to wait until then to see the doctor. Cheyenne Gray reported things are going well with her daughter, and they were able to spend time together this past weekend. She reports continuing to set boundaries with her now ex-boyfriend, and made him move out "a while ago now." She reported setting verbal boundaries and asserting herself appropriately as they continue to have some interactions regarding bills, etc. Additionally, she reports she reached out to her friend, Cheyenne Gray, in a moment of depression in an effort to seek companionship. She reported feeling disappointed when she did not receive a response. We discussed the idea of standards in friendships, and basic expectations for any type of relationship. Cheyenne Gray reported feeling excited as she met a new man at the fair with her daughter, and has been speaking to him via text over the last two weeks. She has a date planned with him this weekend, and was feeling excited about that event. We discussed the notion of trusting her gut, and being assertive with both physical and emotional boundaries as she felt they were appropriate. Cheyenne Gray was in agreement with this idea.   Suicidal/Homicidal: No  Therapist Response: Cheyenne Gray was able to speak openly about her feelings since her last session. She reported feeling depressed and we spent significant time reframing many thoughts in order to improve the emotion associated with them. Cheyenne Gray was able to understand this idea and expressed agreement with the  reframed thought. We will continue to utilize CBT to manage depression and PTSD symptoms. We discussed the idea of adherence and how often she felt she would likely be able to attend therapy.   Plan: Return again in 2 weeks.  Diagnosis: Axis I: Post Traumatic Stress Disorder    Axis II: No diagnosis    Alden Hipp, LCSW 01/13/2018

## 2018-01-15 ENCOUNTER — Other Ambulatory Visit: Payer: Self-pay | Admitting: Psychiatry

## 2018-01-15 DIAGNOSIS — G4700 Insomnia, unspecified: Secondary | ICD-10-CM

## 2018-01-19 ENCOUNTER — Ambulatory Visit: Payer: Medicare Other | Admitting: Psychiatry

## 2018-01-27 ENCOUNTER — Ambulatory Visit: Payer: Medicare Other | Admitting: Licensed Clinical Social Worker

## 2018-02-03 ENCOUNTER — Other Ambulatory Visit: Payer: Self-pay | Admitting: Psychiatry

## 2018-02-03 DIAGNOSIS — F411 Generalized anxiety disorder: Secondary | ICD-10-CM

## 2018-02-03 DIAGNOSIS — F4310 Post-traumatic stress disorder, unspecified: Secondary | ICD-10-CM

## 2018-03-02 ENCOUNTER — Ambulatory Visit: Payer: Medicare Other | Admitting: Internal Medicine

## 2018-03-02 ENCOUNTER — Ambulatory Visit: Payer: Medicare Other | Admitting: Gastroenterology

## 2018-03-02 DIAGNOSIS — Z0289 Encounter for other administrative examinations: Secondary | ICD-10-CM

## 2018-03-03 ENCOUNTER — Telehealth: Payer: Self-pay | Admitting: Cardiovascular Disease

## 2018-03-03 NOTE — Telephone Encounter (Signed)
I spoke with the patient.  She states that she has been having chest pain/ pressure since Sunday. On Sunday she was having chest pain with associated left arm pain.  She took 2 NTG on Sunday with some relief, maybe 1 yesterday, and 1 today. She states she does feel better today with no left arm pain. She can't tell if she is having chest pain or chest pressure. Symptoms are intermittent. Unclear if this is worse with deep breathing. She has not pushed on her chest to see if the pain is reproducible. She does feel like she is under some stress with the holidays and unsure if her symptoms are due to this or not.  I have offered her the ER if symptoms are presently concerning to her or if she is taking 3 NTG every 5 minutes with no relief.  The patient was cathed in 03/2016 Conclusion    Prox LAD to Mid LAD lesion, 40 %stenosed.  Mid LAD lesion, 30 %stenosed.  Mid RCA lesion, 20 %stenosed.  Dist RCA lesion, 20 %stenosed.  The left ventricular systolic function is normal.  LV end diastolic pressure is normal.  The left ventricular ejection fraction is 55-65% by visual estimate.  There is no mitral valve regurgitation.   I have advised the patient at this time it is difficult to evaluate her symptoms over the phone.  She is agreeable with an appointment tomorrow with Ward Givenshris Berge, NP. We will evaluate in clinic tomorrow unless she proceeds to the ER this evening.  The patient is agreeable with the above and voices understanding.

## 2018-03-03 NOTE — Telephone Encounter (Signed)
Patient calling  States that she has been having heart issues Has noticed pain down through arm and has been having to take nitros Patient scheduled to see Dr Mariah MillingGollan on 1/9 but would like to know what to do in the meantime Please call to discuss

## 2018-03-04 ENCOUNTER — Ambulatory Visit (INDEPENDENT_AMBULATORY_CARE_PROVIDER_SITE_OTHER): Payer: Medicare Other | Admitting: Nurse Practitioner

## 2018-03-04 ENCOUNTER — Encounter: Payer: Self-pay | Admitting: Nurse Practitioner

## 2018-03-04 ENCOUNTER — Telehealth: Payer: Self-pay | Admitting: *Deleted

## 2018-03-04 ENCOUNTER — Other Ambulatory Visit
Admission: RE | Admit: 2018-03-04 | Discharge: 2018-03-04 | Disposition: A | Discharge status: Home / Self Care | Payer: Medicare PPO | Source: Ambulatory Visit | Attending: Nurse Practitioner | Admitting: Nurse Practitioner

## 2018-03-04 VITALS — BP 128/90 | HR 62 | Ht 65.0 in | Wt 220.8 lb

## 2018-03-04 DIAGNOSIS — I25119 Atherosclerotic heart disease of native coronary artery with unspecified angina pectoris: Secondary | ICD-10-CM | POA: Diagnosis not present

## 2018-03-04 DIAGNOSIS — I10 Essential (primary) hypertension: Secondary | ICD-10-CM | POA: Diagnosis present

## 2018-03-04 DIAGNOSIS — E785 Hyperlipidemia, unspecified: Secondary | ICD-10-CM

## 2018-03-04 DIAGNOSIS — R002 Palpitations: Secondary | ICD-10-CM | POA: Diagnosis not present

## 2018-03-04 DIAGNOSIS — R079 Chest pain, unspecified: Secondary | ICD-10-CM | POA: Diagnosis present

## 2018-03-04 DIAGNOSIS — E876 Hypokalemia: Secondary | ICD-10-CM

## 2018-03-04 DIAGNOSIS — I5032 Chronic diastolic (congestive) heart failure: Secondary | ICD-10-CM

## 2018-03-04 LAB — CBC WITH DIFFERENTIAL/PLATELET
Abs Immature Granulocytes: 0.08 10*3/uL — ABNORMAL HIGH (ref 0.00–0.07)
BASOS ABS: 0.1 10*3/uL (ref 0.0–0.1)
Basophils Relative: 1 %
Eosinophils Absolute: 0.2 10*3/uL (ref 0.0–0.5)
Eosinophils Relative: 3 %
HEMATOCRIT: 37.4 % (ref 36.0–46.0)
HEMOGLOBIN: 12.3 g/dL (ref 12.0–15.0)
IMMATURE GRANULOCYTES: 1 %
LYMPHS PCT: 43 %
Lymphs Abs: 3.5 10*3/uL (ref 0.7–4.0)
MCH: 31.3 pg (ref 26.0–34.0)
MCHC: 32.9 g/dL (ref 30.0–36.0)
MCV: 95.2 fL (ref 80.0–100.0)
Monocytes Absolute: 0.6 10*3/uL (ref 0.1–1.0)
Monocytes Relative: 8 %
NEUTROS PCT: 44 %
Neutro Abs: 3.6 10*3/uL (ref 1.7–7.7)
Platelets: 204 10*3/uL (ref 150–400)
RBC: 3.93 MIL/uL (ref 3.87–5.11)
RDW: 12.5 % (ref 11.5–15.5)
WBC: 8.1 10*3/uL (ref 4.0–10.5)
nRBC: 0 % (ref 0.0–0.2)

## 2018-03-04 LAB — BASIC METABOLIC PANEL
ANION GAP: 6 (ref 5–15)
BUN: 6 mg/dL (ref 6–20)
CALCIUM: 8.7 mg/dL — AB (ref 8.9–10.3)
CHLORIDE: 107 mmol/L (ref 98–111)
CO2: 27 mmol/L (ref 22–32)
Creatinine, Ser: 0.95 mg/dL (ref 0.44–1.00)
GFR calc Af Amer: >60 mL/min (ref >60)
GFR calc non Af Amer: >60 mL/min (ref >60)
Glucose, Bld: 108 mg/dL — ABNORMAL HIGH (ref 70–99)
Potassium: 3.3 mmol/L — ABNORMAL LOW (ref 3.5–5.1)
Sodium: 140 mmol/L (ref 135–145)

## 2018-03-04 LAB — TROPONIN I: Troponin I: <0.03 ng/mL (ref <0.03)

## 2018-03-04 MED ORDER — ISOSORBIDE MONONITRATE ER 60 MG PO TB24: 60.0000 mg | ORAL_TABLET | Freq: Every day | ORAL | 3 refills | Status: DC

## 2018-03-04 MED ORDER — POTASSIUM CHLORIDE CRYS ER 20 MEQ PO TBCR: 40.0000 meq | EXTENDED_RELEASE_TABLET | Freq: Every day | ORAL | 3 refills | Status: DC

## 2018-03-04 MED ORDER — NITROGLYCERIN 0.4 MG SL SUBL: 0.4000 mg | SUBLINGUAL_TABLET | SUBLINGUAL | 3 refills | Status: DC | PRN

## 2018-03-04 NOTE — Patient Instructions (Signed)
Medication Instructions:  Your physician has recommended you make the following change in your medication:  1- INCREASE Isosorbide to 60 mg by mouth once a day.  If you need a refill on your cardiac medications before your next appointment, please call your pharmacy.   Lab work: 1.   Your physician recommends that you return for lab work in: TODAY at [employer].  CBC, BMET, TROPONIN STAT. - Please go to the [facility]. You will check in at the front desk to the right as you walk into the atrium. Valet Parking is offered if needed.  2. Your physician recommends that you return for URINE PREGNANCY TEST EARLY THE MORNING OF THE STRESS TEST. PLEASE LET THEM KNOW YOU ARE THERE FOR STRESS TEST AND TO GO TO THE LAB FOR URINE PREGNANCY TEST. - You may need to arrive about 45 minutes prior to stress test instead of the 15 minutes.     If you have labs (blood work) drawn today and your tests are completely normal, you will receive your results only by: Marland Kitchen MyChart Message (if you have MyChart) OR . A paper copy in the mail If you have any lab test that is abnormal or we need to change your treatment, we will call you to review the results.  Testing/Procedures: 1. Your physician has recommended that you wear an 14 DAY ZIO event monitor. Event monitors are medical devices that record the heart's electrical activity. Doctors most often [place] these monitors to diagnose arrhythmias. Arrhythmias are problems with the speed or rhythm of the heartbeat. The monitor is a small, portable device. You can wear one while you do your normal daily activities. This is usually used to diagnose what is causing palpitations/syncope (passing out). A Zio Patch Event Heart monitor will be applied to your chest today.  You will wear the patch for 14 days. After 24 hours, you may shower with the heart monitor on. If you feel any SYMPTOMS SUCH AS PALPITATIONS, you may press and release the button in the middle of the  monitor.   2. Your physician has requested that you have a lexiscan myoview. For further information please visit [email or web address] Please follow instruction sheet, as given.  [facility] MYOVIEW  Your caregiver has ordered a Stress Test with nuclear imaging. The purpose of this test is to evaluate the blood supply to your heart muscle. This procedure is referred to as a "Non-Invasive Stress Test." This is because other than having an IV started in your vein, nothing is inserted or "invades" your body. Cardiac stress tests are done to find areas of poor blood flow to the heart by determining the extent of coronary artery disease (CAD). Some patients exercise on a treadmill, which naturally increases the blood flow to your heart, while others who are  unable to walk on a treadmill due to physical limitations have a pharmacologic/chemical stress agent called Lexiscan . This medicine will mimic walking on a treadmill by temporarily increasing your coronary blood flow.   Please note: these test may take anywhere between 2-4 hours to complete  PLEASE REPORT TO [facility] ENTR[facility]  THE VOLUNTEERS AT THE FIRST DESK WILL DIRECT YOU WHERE TO GO  Date of Procedure:_____________________________________  Arrival Time for Procedure:______________________________   PLEASE NOTIFY THE OFFICE AT LEAST 24 HOURS IN ADVANCE IF YOU ARE UNABLE TO KEEP YOUR APPOINTMENT.  [phone] AND  PLEASE NOTIFY NUCLEAR MEDICINE AT [facility] AT LEAST 24 HOURS IN ADVANCE IF YOU ARE UNABLE  TO KEEP YOUR APPOINTMENT. [phone]  How to prepare for your Myoview test:  1. Do not eat or drink after midnight 2. No caffeine for 24 hours prior to test 3. No smoking 24 hours prior to test. 4. Your medication may be taken with water.  If your doctor stopped a medication because of this test, do not take that medication. 5. Ladies, please do not wear dresses.  Skirts or pants are appropriate. Please wear a short sleeve shirt. 6. No  perfume, cologne or lotion. 7. Wear comfortable walking shoes. No heels!    Follow-Up: At [facility], you and your health needs are our priority.  As part of our continuing mission to provide you with exceptional heart care, we have created designated Provider Care Teams.  These Care Teams include your primary Cardiologist (physician) and Advanced Practice Providers (APPs -  Physician Assistants and Nurse Practitioners) who all work together to provide you with the care you need, when you need it. You will need a follow up appointment in 6 weeks. You may see Julien Nordmannimothy Gollan, MD or one of the following Advanced Practice Providers on your designated Care Team:   Nicolasa Duckinghristopher Berge, NP Eula Listenyan Dunn, PA-C . Marisue IvanJacquelyn Visser, PA-C      Cardiac Nuclear Scan A cardiac nuclear scan is a test that measures blood flow to the heart when a person is resting and when he or she is exercising. The test looks for problems such as:  Not enough blood reaching a portion of the heart.  The heart muscle not working normally. You may need this test if:  You have heart disease.  You have had abnormal lab results.  You have had heart surgery or a balloon procedure to open up blocked arteries (angioplasty).  You have chest pain.  You have shortness of breath. In this test, a radioactive dye (tracer) is injected into your bloodstream. After the tracer has traveled to your heart, an imaging device is used to measure how much of the tracer is absorbed by or distributed to various areas of your heart. This procedure is usually done at a hospital and takes 2-4 hours. Tell a health care provider about:  Any allergies you have.  All medicines you are taking, including vitamins, herbs, eye drops, creams, and over-the-counter medicines.  Any problems you or family members have had with anesthetic medicines.  Any blood disorders you have.  Any surgeries you have had.  Any medical conditions you  have.  Whether you are pregnant or may be pregnant. What are the risks? Generally, this is a safe procedure. However, problems may occur, including:  Serious chest pain and heart attack. This is only a risk if the stress portion of the test is done.  Rapid heartbeat.  Sensation of warmth in your chest. This usually passes quickly.  Allergic reaction to the tracer. What happens before the procedure?  Ask your health care provider about changing or stopping your regular medicines. This is especially important if you are taking diabetes medicines or blood thinners.  Follow instructions from your health care provider about eating or drinking restrictions.  Remove your jewelry on the day of the procedure. What happens during the procedure?  An IV will be inserted into one of your veins.  Your health care provider will inject a small amount of radioactive tracer through the IV.  You will wait for 20-40 minutes while the tracer travels through your bloodstream.  Your heart activity will be monitored with an electrocardiogram (  ECG).  You will lie down on an exam table.  Images of your heart will be taken for about 15-20 minutes.  You may also have a stress test. For this test, one of the following may be done: ? You will exercise on a treadmill or stationary bike. While you exercise, your heart's activity will be monitored with an ECG, and your blood pressure will be checked. ? You will be given medicines that will increase blood flow to parts of your heart. This is done if you are unable to exercise.  When blood flow to your heart has peaked, a tracer will again be injected through the IV.  After 20-40 minutes, you will get back on the exam table and have more images taken of your heart.  Depending on the type of tracer used, scans may need to be repeated 3-4 hours later.  Your IV line will be removed when the procedure is over. The procedure may vary among health care providers  and hospitals. What happens after the procedure?  Unless your health care provider tells you otherwise, you may return to your normal schedule, including diet, activities, and medicines.  Unless your health care provider tells you otherwise, you may increase your fluid intake. This will help to flush the contrast dye from your body. Drink enough fluid to keep your urine pale yellow.  Ask your health care provider, or the department that is doing the test: ? When will my results be ready? ? How will I get my results? Summary  A cardiac nuclear scan measures the blood flow to the heart when a person is resting and when he or she is exercising.  Tell your health care provider if you are pregnant.  Before the procedure, ask your health care provider about changing or stopping your regular medicines. This is especially important if you are taking diabetes medicines or blood thinners.  After the procedure, unless your health care provider tells you otherwise, increase your fluid intake. This will help flush the contrast dye from your body.  After the procedure, unless your health care provider tells you otherwise, you may return to your normal schedule, including diet, activities, and medicines. This information is not intended to replace advice given to you by your health care provider. Make sure you discuss any questions you have with your health care provider. Document Released: 03/28/2004 Document Revised: 08/17/2017 Document Reviewed: 08/17/2017 Elsevier Interactive Patient Education  2019 [employer]

## 2018-03-04 NOTE — Progress Notes (Signed)
Office Visit    Patient Name: Cheyenne Gray Date of Encounter: 03/04/2018  Primary Care Provider:  McLean-Scocuzza, Pasty Spillersracy N, MD Primary Cardiologist:  Julien Nordmannimothy Gollan, MD  Chief Complaint    52 year old female with a history of chest pain and nonobstructive CAD, hypertension, palpitations, obesity, long QT syndrome, and family history of premature CAD, who presents for follow-up related to chest pain.  Past Medical History    Past Medical History:  Diagnosis Date  . (HFpEF) heart failure with preserved ejection fraction (HCC)    a. Echo 2014: EF 65-70%, nl WM, mildly dilated LA, PASP nl; b. 12/2014 Echo: EF 65-70%, no rwma, mod septal hypertrophy w/o LVOT gradient or SAM; c. 07/2017 Echo: EF 55-60%, no rwma, mildly dil RV w/ nl syst fxn. Mildly dil RA. Dilated IVC w/ elevated CVP. Triv post effusion.  Marland Kitchen. Anxiety   . Arthritis   . Asthma   . B12 deficiency   . Chronic back pain   . Chronic headaches   . Chronic pain    a. on methadone  . Concussion    hx of 4  . Coronary artery disease, non-occlusive    a. LHC 1/18: proximal to mid LAD 40% stenosed, mid LAD 30% stenosed, mid RCA 20% stenosed, distal RCA 20% stenosed, EF 55-65%, LVEDP normal  . Depression   . DJD (degenerative joint disease), multiple sites   . Frequent headaches   . Gallstone   . H/O non-ST elevation myocardial infarction (NSTEMI)   . Hand, foot and mouth disease 2016  . History of shingles   . Hypertension   . Iron deficiency anemia   . Long QT interval   . Methadone use (HCC)    managed by Dr. Metta Clinesrisp  . Migraine   . Obesity   . Palpitations    a. 24 hour Holter: NSR, sinus brady down to 48, occasional PVCs & couplets, 8 beats NSVT; b. 30 day event monitor 2015: NSR with rare PVC.  Marland Kitchen. Psoriasis   . Syncope and collapse   . Vitamin D deficiency    Past Surgical History:  Procedure Laterality Date  . ABDOMINOPLASTY     tummy tuck ? year   . BARIATRIC SURGERY  2001  . CARDIAC CATHETERIZATION  Left 04/16/2016   Procedure: Left Heart Cath and Coronary Angiography;  Surgeon: Antonieta Ibaimothy J Gollan, MD;  Location: [facility] INVASIVE CV LAB;  Service: Cardiovascular;  Laterality: Left;  . CHOLECYSTECTOMY  2001  . GALLBLADDER SURGERY    . GASTRIC BYPASS  2001  . GASTROPLASTY      Allergies  Allergies  Allergen Reactions  . Penicillins Hives, Shortness Of Breath and Rash    Has patient had a PCN reaction causing immediate rash, facial/tongue/throat swelling, SOB or lightheadedness with hypotension: Yes Has patient had a PCN reaction causing severe rash involving mucus membranes or skin necrosis: No Has patient had a PCN reaction that required hospitalization No Has patient had a PCN reaction occurring within the last 10 years: No If all of the above answers are "NO", then may proceed with Cephalosporin use.     History of Present Illness    52 year old female with the above complex past medical history including family history premature CAD, long QT syndrome, chest pain with nonobstructive CAD by catheterization early 2018, hypertension, obesity status post bariatric surgery, and palpitations with prior finding of PVCs on Holter monitoring but without any significant findings on 30-day event monitoring.  Prior echocardiogram October 2016 showed normal  LV function.  Echo in May 2019 showed normal LV function.  This was performed in the setting of lower extremity swelling and weight gain.  She had significant improvement in lower extremity swelling with 15 pound weight loss following initiation of low-dose Lasix.  When she was last seen in clinic in August, she was stable without chest pain.  Since her last visit, she has noted intermittent chest discomfort and says that this might occur with rest or with exertion and she takes 2-3 nitroglycerin a month.  Symptoms can last a few hours at a time.  On December 15, she had a prolonged episode of midsternal chest discomfort radiating through to her back  associated with left arm pain and dyspnea that lasted about 3-1/2 hours.  She took a total of 3 sublingual nitroglycerin tablets over that period of time.  She had 2 separate episodes of chest discomfort yesterday.  She took nitro for both of those and the longest lasted about 30 minutes.  Due to symptoms, she contacted our office yesterday and was added onto my schedule today.  She has not had chest pain this morning.  She does have chronic lower back pain and now bilateral hip and leg pain that occurs with rest and activity.  She also reports today that during the second episode of chest pain yesterday, she was having a racing heart for about 5 minutes.  She denies PND, orthopnea, dizziness, syncope, or early satiety.  She does sometimes note lower extremity swelling above her sock line.  Home Medications    Prior to Admission medications   Medication Sig Start Date End Date Taking? Authorizing Provider  albuterol (PROVENTIL HFA;VENTOLIN HFA) 108 (90 BASE) MCG/ACT inhaler Inhale 2 puffs into the lungs as needed for wheezing.   Yes [provider]  busPIRone (BUSPAR) 15 MG tablet TAKE 1 TABLET (15 MG TOTAL) BY MOUTH 2 (TWO) TIMES DAILY. 02/03/18  Yes Jomarie Longs, MD  DULoxetine (CYMBALTA) 60 MG capsule Take 1 capsule (60 mg total) by mouth daily. 12/21/17  Yes Jomarie Longs, MD  gabapentin (NEURONTIN) 400 MG capsule Limit 2 tablets in the a.m. and midday and 3 tablets each evening    Please dispense a three-month supply 11/15/15  Yes Ewing Schlein, MD  isosorbide mononitrate (IMDUR) 30 MG 24 hr tablet Take 1 tablet (30 mg total) by mouth daily. 04/03/17  Yes Antonieta Iba, MD  methadone (DOLOPHINE) 10 MG tablet Limit 1-2 tablets by mouth 2-3 times per day if tolerated 11/15/15  Yes Ewing Schlein, MD  nitroGLYCERIN (NITROSTAT) 0.4 MG SL tablet Place 1 tablet (0.4 mg total) under the tongue every 5 (five) minutes as needed for chest pain. 12/07/15 03/04/18 Yes Gollan, Tollie Pizza, MD    rOPINIRole (REQUIP) 0.25 MG tablet Take 0.5-0.75 mg by mouth at bedtime.    Yes [provider]  rosuvastatin (CRESTOR) 10 MG tablet TAKE 1 TABLET BY MOUTH EVERY DAY 12/03/17  Yes Gollan, Tollie Pizza, MD  SUMAtriptan (IMITREX) 100 MG tablet TAKE 1 TABLET BY MOUTH AS NEEDED FOR HEADACHE. MAY REPEAT IN 2 HRS IF NEEDED. MAX 200MG /DAY 10/09/17  Yes [provider]  SUMAtriptan (IMITREX) 50 MG tablet TAKE 1 TABLET BY MOUTH STAT, MAY REPEAT IN 2 HOURS, MAX 200MG  PER DAY 08/05/14  Yes [provider]  traZODone (DESYREL) 100 MG tablet TAKE 0.5-1 TABLETS (50-100 MG TOTAL) BY MOUTH AT BEDTIME AS NEEDED FOR SLEEP. 01/15/18  Yes Jomarie Longs, MD  vitamin B-12 (CYANOCOBALAMIN) 500 MCG tablet Take  1 tablet (500 mcg total) by mouth daily. 11/03/17  Yes Rickard Patience, MD    Review of Systems    Chest pain, dyspnea, palpitations, chronic back and leg pain, and mild lower extremity swelling as outlined above.  She denies PND, orthopnea, dizziness, syncope, or early satiety.  All other systems reviewed and are otherwise negative except as noted above.  Physical Exam    VS:  BP 128/90 (BP Location: Left Arm, Patient Position: Sitting, Cuff Size: Normal)   Pulse 62   Ht 5\' 5"  (1.651 m)   Wt 220 lb 12 oz (100.1 kg)   LMP 02/18/2016 (Approximate) Comment: neg HCG-03/26/2016  BMI 36.73 kg/m  , BMI Body mass index is 36.73 kg/m. GEN: Well nourished, well developed, in no acute distress. HEENT: normal. Neck: Supple, no JVD, carotid bruits, or masses. Cardiac: RRR, no murmurs, rubs, or gallops. No clubbing, cyanosis, edema.  Radials/DP/PT 2+ and equal bilaterally.  Respiratory:  Respirations regular and unlabored, clear to auscultation bilaterally. GI: Soft, nontender, nondistended, BS + x 4. MS: no deformity or atrophy. Skin: warm and dry, no rash. Neuro:  Strength and sensation are intact. Psych: Normal affect.  Accessory Clinical Findings    ECG personally reviewed by me today  -regular sinus rhythm, 62, left atrial enlargement, lateral infarct- no acute changes.  Lab Results  Component Value Date   TROPONINI <0.03 03/04/2018    Lab Results  Component Value Date   WBC 8.1 03/04/2018   HGB 12.3 03/04/2018   HCT 37.4 03/04/2018   MCV 95.2 03/04/2018   PLT 204 03/04/2018   Lab Results  Component Value Date   CREATININE 0.95 03/04/2018   BUN 6 03/04/2018   NA 140 03/04/2018   K 3.3 (L) 03/04/2018   CL 107 03/04/2018   CO2 27 03/04/2018    Assessment & Plan    1.  Chest pain/nonobstructive CAD: Patient reports intermittent chest discomfort requiring nitrates about 2-3 times per month however, since December 15, she has had more frequent episodes including a 3-1/2-hour episode on December 15 and to 30-minute episode yesterday.  Symptoms are improved with nitrates but not necessarily totally resolved.  On Sunday, symptoms radiated down her arm as well and into her shoulder blades.  ECG is unchanged today.  She has not had chest pain today.  I sent her over to the lab for a stat troponin and this returned normal, which is quite reassuring.  Given a history of nonobstructive CAD by catheterization in January 2018, I will arrange for a Lexiscan Myoview to rule out ischemia.  I have also asked her to increase her isosorbide mononitrate to 60 mg daily.  She otherwise remains on aspirin and statin therapy.  2.  HFpEF: Volume stable today.  Heart rate and blood pressure stable.  3.  Hyperlipidemia: LDL was 47 in July 2018.  She remains on statin therapy.  4.  Hypokalemia: Potassium 3.3 today.  I have asked her to take potassium chloride 40 mill equivalents daily.  It appears that she was previously on 40 twice daily however this ended up being discontinued.  Follow-up bmet in 1 week.  5.  Palpitations: Patient reported tachypalpitations associated with chest pain yesterday.  Will place a ZIO monitor to reevaluate.  6.  Long QT syndrome: QTc stable at 472 today.   Supplement potassium.  7.  Essential HTN:  Stable.  8.  Disposition: Patient will have follow-up Lexiscan Myoview.  Follow-up in clinic in 1 month or sooner if  necessary.   Nicolasa Duckinghristopher Berge, NP 03/04/2018, 3:52 PM

## 2018-03-04 NOTE — Telephone Encounter (Signed)
Patient verbalized understanding of results and plan of care. She is aware to go to the pharmacy to pick up Potassium 40 meQ by mouth once a day and [facility] 1 week for lab work. She was appreciative. Rx sent to pharmacy and BMET order entered.

## 2018-03-04 NOTE — Telephone Encounter (Signed)
-----   Message from Creig Hineshristopher Ronald Berge, NP sent at 03/04/2018 12:01 PM EST ----- Trop is normal, making her chest pain since Sunday very unlikely to be related to CAD. No anemia.  WBC wnl. Kidney fxn wnl. K is low @ 3.3 - please add kdur 40meq daily. F/u bmet in a week (can have when she comes in for stress test) to f/u K.

## 2018-03-08 ENCOUNTER — Other Ambulatory Visit: Payer: Self-pay

## 2018-03-08 ENCOUNTER — Encounter: Payer: Self-pay | Admitting: Psychiatry

## 2018-03-08 ENCOUNTER — Ambulatory Visit (INDEPENDENT_AMBULATORY_CARE_PROVIDER_SITE_OTHER): Payer: Medicare Other | Admitting: Psychiatry

## 2018-03-08 VITALS — BP 128/85 | HR 58 | Temp 98.1°F | Wt 218.2 lb

## 2018-03-08 DIAGNOSIS — F1120 Opioid dependence, uncomplicated: Secondary | ICD-10-CM

## 2018-03-08 DIAGNOSIS — G4700 Insomnia, unspecified: Secondary | ICD-10-CM | POA: Diagnosis not present

## 2018-03-08 DIAGNOSIS — F411 Generalized anxiety disorder: Secondary | ICD-10-CM | POA: Diagnosis not present

## 2018-03-08 DIAGNOSIS — I25119 Atherosclerotic heart disease of native coronary artery with unspecified angina pectoris: Secondary | ICD-10-CM

## 2018-03-08 DIAGNOSIS — F331 Major depressive disorder, recurrent, moderate: Secondary | ICD-10-CM | POA: Diagnosis not present

## 2018-03-08 DIAGNOSIS — F4310 Post-traumatic stress disorder, unspecified: Secondary | ICD-10-CM

## 2018-03-08 DIAGNOSIS — R41840 Attention and concentration deficit: Secondary | ICD-10-CM

## 2018-03-08 DIAGNOSIS — F1590 Other stimulant use, unspecified, uncomplicated: Secondary | ICD-10-CM

## 2018-03-08 MED ORDER — DULOXETINE HCL 60 MG PO CPEP: 60.0000 mg | ORAL_CAPSULE | Freq: Every day | ORAL | 0 refills | Status: DC

## 2018-03-08 MED ORDER — ZOLPIDEM TARTRATE 5 MG PO TABS: 5.0000 mg | ORAL_TABLET | Freq: Every evening | ORAL | 0 refills | Status: DC | PRN

## 2018-03-08 MED ORDER — DULOXETINE HCL 30 MG PO CPEP: 30.0000 mg | ORAL_CAPSULE | Freq: Every day | ORAL | 0 refills | Status: DC
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
## 2018-03-08 NOTE — Patient Instructions (Signed)
Zolpidem tablets  What is this medicine?  ZOLPIDEM (zole PI dem) is used to treat insomnia. This medicine helps you to fall asleep and sleep through the night.  This medicine may be used for other purposes; ask your health care provider or pharmacist if you have questions.  COMMON BRAND NAME(S): Ambien  What should I tell my health care provider before I take this medicine?  They need to know if you have any of these conditions:  -depression  -history of drug abuse or addiction  -if you often drink alcohol  -liver disease  -lung or breathing disease  -myasthenia gravis  -sleep apnea  -sleep-walking, driving, eating or other activity while not fully awake after taking a sleep medicine  -suicidal thoughts, plans, or attempt; a previous suicide attempt by you or a family member  -an unusual or allergic reaction to zolpidem, other medicines, foods, dyes, or preservatives  -pregnant or trying to get pregnant  -breast-feeding  How should I use this medicine?  Take this medicine by mouth with a glass of water. Follow the directions on the prescription label. It is better to take this medicine on an empty stomach and only when you are ready for bed. Do not take your medicine more often than directed. If you have been taking this medicine for several weeks and suddenly stop taking it, you may get unpleasant withdrawal symptoms. Your doctor or health care professional may want to gradually reduce the dose. Do not stop taking this medicine on your own. Always follow your doctor or health care professional's advice.  A special MedGuide will be given to you by the pharmacist with each prescription and refill. Be sure to read this information carefully each time.  Talk to your pediatrician regarding the use of this medicine in children. Special care may be needed.  Overdosage: If you think you have taken too much of this medicine contact a poison control center or emergency room at once.  NOTE: This medicine is only for  you. Do not share this medicine with others.  What if I miss a dose?  This does not apply. This medicine should only be taken immediately before going to sleep. Do not take double or extra doses.  What may interact with this medicine?  -alcohol  -antihistamines for allergy, cough and cold  -certain medicines for anxiety or sleep  -certain medicines for depression, like amitriptyline, fluoxetine, sertraline  -certain medicines for fungal infections like ketoconazole and itraconazole  -certain medicines for seizures like phenobarbital, primidone  -ciprofloxacin  -dietary supplements for sleep, like valerian or kava kava  -general anesthetics like halothane, isoflurane, methoxyflurane, propofol  -local anesthetics like lidocaine, pramoxine, tetracaine  -medicines that relax muscles for surgery  -narcotic medicines for pain  -phenothiazines like chlorpromazine, mesoridazine, prochlorperazine, thioridazine  -rifampin  This list may not describe all possible interactions. Give your health care provider a list of all the medicines, herbs, non-prescription drugs, or dietary supplements you use. Also tell them if you smoke, drink alcohol, or use illegal drugs. Some items may interact with your medicine.  What should I watch for while using this medicine?  Visit your doctor or health care professional for regular checks on your progress. Keep a regular sleep schedule by going to bed at about the same time each night. Avoid caffeine-containing drinks in the evening hours. When sleep medicines are used every night for more than a few weeks, they may stop working. Talk to your doctor if you still have trouble   sleeping.  After taking this medicine, you may get up out of bed and do an activity that you do not know you are doing. The next morning, you may have no memory of this. Activities include driving a car ("sleep-driving"), making and eating food, talking on the phone, sexual activity, and sleep-walking. Serious injuries have  occurred. Stop the medicine and call your doctor right away if you find out you have done any of these activities. Do not take this medicine if you have used alcohol that evening. Do not take it if you have taken another medicine for sleep. The risk of doing these sleep-related activities is higher.  Wait for at least 8 hours after you take a dose before driving or doing other activities that require full mental alertness. Do not take this medicine unless you are able to stay in bed for a full night (7 to 8 hours) before you must be active again. You may have a decrease in mental alertness the day after use, even if you feel that you are fully awake. Tell your doctor if you will need to perform activities requiring full alertness, such as driving, the next day. Do not stand or sit up quickly after taking this medicine, especially if you are an older patient. This reduces the risk of dizzy or fainting spells.  If you or your family notice any changes in your behavior, such as new or worsening depression, thoughts of harming yourself, anxiety, other unusual or disturbing thoughts, or memory loss, call your doctor right away.  After you stop taking this medicine, you may have trouble falling asleep. This is called rebound insomnia. This problem usually goes away on its own after 1 or 2 nights.  What side effects may I notice from receiving this medicine?  Side effects that you should report to your doctor or health care professional as soon as possible:  -allergic reactions like skin rash, itching or hives, swelling of the face, lips, or tongue  -breathing problems  -changes in vision  -confusion  -depressed mood or other changes in moods or emotions  -feeling faint or lightheaded, falls  -hallucinations  -loss of balance or coordination  -loss of memory  -numbness or tingling of the tongue  -restlessness, excitability, or feelings of anxiety or agitation  -signs and symptoms of liver injury like dark yellow or brown  urine; general ill feeling or flu-like symptoms; light-colored stools; loss of appetite; nausea; right upper belly pain; unusually weak or tired; yellowing of the eyes or skin  -suicidal thoughts  -unusual activities while not fully awake like driving, eating, making phone calls, or sexual activity  Side effects that usually do not require medical attention (report to your doctor or health care professional if they continue or are bothersome):  -dizziness  -drowsiness the day after you take this medicine  -headache  This list may not describe all possible side effects. Call your doctor for medical advice about side effects. You may report side effects to FDA at 1-800-FDA-1088.  Where should I keep my medicine?  Keep out of the reach of children. This medicine can be abused. Keep your medicine in a safe place to protect it from theft. Do not share this medicine with anyone. Selling or giving away this medicine is dangerous and against the law.  This medicine may cause accidental overdose and death if taken by other adults, children, or pets. Mix any unused medicine with a substance like cat litter or coffee grounds. Then throw   the medicine away in a sealed container like a sealed bag or a coffee can with a lid. Do not use the medicine after the expiration date.  Store at room temperature between 20 and 25 degrees C (68 and 77 degrees F).  NOTE: This sheet is a summary. It may not cover all possible information. If you have questions about this medicine, talk to your doctor, pharmacist, or health care provider.   2019 Elsevier/Gold Standard (2017-11-20 11:51:08)

## 2018-03-09 ENCOUNTER — Encounter: Payer: Self-pay | Admitting: Licensed Clinical Social Worker

## 2018-03-09 ENCOUNTER — Ambulatory Visit (INDEPENDENT_AMBULATORY_CARE_PROVIDER_SITE_OTHER): Payer: Medicare Other | Admitting: Licensed Clinical Social Worker

## 2018-03-09 DIAGNOSIS — F4310 Post-traumatic stress disorder, unspecified: Secondary | ICD-10-CM

## 2018-03-09 NOTE — Progress Notes (Signed)
   THERAPIST PROGRESS NOTE  Session Time: 1100  Participation Level: Active  Behavioral Response: NeatAlertDepressed  Type of Therapy: Individual Therapy  Treatment Goals addressed: Coping  Interventions: Supportive  Summary: Altamese DillingJodie M Collymore is a 52 y.o. female who presents with symptoms related to her diagnosis. Sharrie was joined by her daughter, Lars MageKria, for her session. 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 He just wanted a piece of ass and didn't want to see me again." LCSW encouraged Phoenyx to reframe her thoughts around the situation in order to feel more positively about the encounter. Wende was able to do this in the moment. Bridgit reported feeling like she has been able to set more firm boundaries with her ex-boyfriend and her ex-best friend over the last month. She reports also encouraging her daughter to utilize assertive communication to set appropriate boundaries with others as well. Andalyn reports wanting to attend therapy more regularly in order to manage her depression more appropriately.   Suicidal/Homicidal: No  Therapist Response: Jalaysha presented with a flat, depressed affect but was able to speak openly and honestly about her symptoms. Edwina was able to accept input from LCSW and recognize ways she can improve her emotional regulation skills moving forward. We will continue to utilize CBT to assist Audelia in managing her PTSD symptoms.   Plan: Return again in 2 weeks.  Diagnosis: Axis I: Post Traumatic Stress Disorder    Axis II: No diagnosis    Heidi DachKelsey Deadrick Stidd, LCSW 03/09/2018

## 2018-03-11 ENCOUNTER — Ambulatory Visit: Admission: RE | Admit: 2018-03-11 | Payer: Medicare Other | Source: Ambulatory Visit

## 2018-03-11 ENCOUNTER — Telehealth: Payer: Self-pay | Admitting: Nurse Practitioner

## 2018-03-11 NOTE — Telephone Encounter (Signed)
Left voicemail message for patient to call back when ready to reschedule stress test.

## 2018-03-11 NOTE — Telephone Encounter (Signed)
Pt did not show for stress test today.

## 2018-03-15 ENCOUNTER — Ambulatory Visit (INDEPENDENT_AMBULATORY_CARE_PROVIDER_SITE_OTHER): Payer: Medicare Other | Admitting: Psychiatry

## 2018-03-15 ENCOUNTER — Ambulatory Visit: Payer: Medicare Other

## 2018-03-15 ENCOUNTER — Encounter: Payer: Self-pay | Admitting: Psychiatry

## 2018-03-15 ENCOUNTER — Other Ambulatory Visit: Payer: Self-pay

## 2018-03-15 VITALS — BP 126/82 | HR 61 | Wt 221.6 lb

## 2018-03-15 DIAGNOSIS — F1120 Opioid dependence, uncomplicated: Secondary | ICD-10-CM

## 2018-03-15 DIAGNOSIS — F411 Generalized anxiety disorder: Secondary | ICD-10-CM

## 2018-03-15 DIAGNOSIS — I25119 Atherosclerotic heart disease of native coronary artery with unspecified angina pectoris: Secondary | ICD-10-CM

## 2018-03-15 DIAGNOSIS — F4310 Post-traumatic stress disorder, unspecified: Secondary | ICD-10-CM

## 2018-03-15 DIAGNOSIS — F331 Major depressive disorder, recurrent, moderate: Secondary | ICD-10-CM

## 2018-03-15 DIAGNOSIS — R41840 Attention and concentration deficit: Secondary | ICD-10-CM

## 2018-03-15 DIAGNOSIS — F1590 Other stimulant use, unspecified, uncomplicated: Secondary | ICD-10-CM

## 2018-03-15 DIAGNOSIS — G4700 Insomnia, unspecified: Secondary | ICD-10-CM

## 2018-03-15 NOTE — Progress Notes (Signed)
[facility] MD OP Progress Note  03/15/2018 4:36 PM Cheyenne Gray  MRN:  [id number]  Chief Complaint: ' I am here for follow up." Chief Complaint    Follow-up; Medication Refill     HPI: Cheyenne Gray is a 52 yr old Caucasian female, widowed, on disability, has a history of depression, anxiety, multiple medical problems like heart failure, vitamin B12 deficiency, history of prolonged QT, chronic back pain on methadone, vitamin D deficiency, iron deficiency, history of gastric bypass, non-ST elevation MI, hypertension, presented to the clinic today for a follow-up visit.  Patient today reports she was sick with flulike symptoms the past few days.  She is currently feeling better.  She however has not started the Ambien yet since she was feeling congested and had cough at night to begin with.  She reports she can start tonight.  Her daughter is currently at home and will be able to monitor her on the new medication.  Patient continues to take the Cymbalta at 90 mg, which was increased last visit.  She denies any significant side effects.  She continues to struggle with mood symptoms however would like to give medications more time.  Patient denies any suicidality.  Patient denies any perceptual disturbances.  Patient reports she could not do a lot of activities during Christmas day since she was sick.  She and her daughter are planning to spend some time with family this week.  She looks forward to the same.  She will continue psychotherapy sessions.   Visit Diagnosis:    ICD-10-CM   1. PTSD (post-traumatic stress disorder) F43.10   2. GAD (generalized anxiety disorder) F41.1   3. MDD (major depressive disorder), recurrent episode, moderate (HCC) F33.1   4. Insomnia, unspecified type G47.00   5. 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 Opioid dependence in controlled environment ([place]) F11.20   7. Attention and concentration deficit R41.840     Past Psychiatric History: Reviewed past psychiatric history  from my progress note on 11/06/2017.  Past trials of Paxil, Cymbalta, Valium, melatonin, trazodone  Past Medical History:  Past Medical History:  Diagnosis Date  . (HFpEF) heart failure with preserved ejection fraction ([place])    a. Echo 2014: EF 65-70%, nl WM, mildly dilated LA, PASP nl; b. 12/2014 Echo: EF 65-70%, no rwma, mod septal hypertrophy w/o LVOT gradient or SAM; c. 07/2017 Echo: EF 55-60%, no rwma, mildly dil RV w/ nl syst fxn. Mildly dil RA. Dilated IVC w/ elevated CVP. Triv post effusion.  Marland Kitchen Anxiety   . Arthritis   . Asthma   . B12 deficiency   . Chronic back pain   . Chronic headaches   . Chronic pain    a. on methadone  . Concussion    hx of 4  . Coronary artery disease, non-occlusive    a. LHC 1/18: proximal to mid LAD 40% stenosed, mid LAD 30% stenosed, mid RCA 20% stenosed, distal RCA 20% stenosed, EF 55-65%, LVEDP normal  . Depression   . DJD (degenerative joint disease), multiple sites   . Frequent headaches   . Gallstone   . H/O non-ST elevation myocardial infarction (NSTEMI)   . Hand, foot and mouth disease 2016  . History of shingles   . Hypertension   . Iron deficiency anemia   . Long QT interval   . Methadone use ([place])    managed by Dr. Primus Bravo  . Migraine   . Obesity   . Palpitations    a. 24 hour  Holter: NSR, sinus brady down to 48, occasional PVCs & couplets, 8 beats NSVT; b. 30 day event monitor 2015: NSR with rare PVC.  Marland Kitchen Psoriasis   . Syncope and collapse   . Vitamin D deficiency     Past Surgical History:  Procedure Laterality Date  . ABDOMINOPLASTY     tummy tuck ? year   . [place] SURGERY  2001  . CARDIAC CATHETERIZATION Left 04/16/2016   Procedure: Left Heart Cath and Coronary Angiography;  Surgeon: [name], MD;  Location: [facility];  Service: Cardiovascular;  Laterality: Left;  . CHOLECYSTECTOMY  2001  . GALLBLADDER SURGERY    . GASTRIC BYPASS  2001  . GASTROPLASTY      Family Psychiatric History: I have reviewed  family psychiatric history from my progress note on 11/06/2017.  Family History:  Family History  Problem Relation Age of Onset  . Heart attack Mother   . Cancer Mother        pancreatitic   . Early death Mother   . Heart attack Father 26       MI  . Early death Father   . Heart disease Father   . Heart attack Brother   . Heart disease Brother   . Arthritis Brother   . Depression Brother   . Diabetes Brother   . Heart attack Maternal Grandmother   . Cancer Maternal Grandmother        pancreatitic   . Heart disease Maternal Grandmother   . Cancer Maternal Uncle        pancreatitic   . Cancer Paternal Grandmother        ? type   . Diabetes Paternal Grandmother   . Cancer Maternal Uncle        pancreatitic   . Breast cancer Maternal Aunt     Social History: Reviewed social history from my progress note on 11/06/2017 Social History   Socioeconomic History  . Marital status: Widowed    Spouse name: Not on file  . Number of children: 1  . Years of education: assoc degree  . Highest education level: Associate degree: occupational, Hotel manager, or vocational program  Occupational History  . Not on file  Social Needs  . Financial resource strain: Very hard  . Food insecurity:    Worry: Never true    Inability: Never true  . Transportation needs:    Medical: No    Non-medical: No  Tobacco Use  . Smoking status: Never Smoker  . Smokeless tobacco: Never Used  Substance and Sexual Activity  . Alcohol use: No    Alcohol/week: 0.0 standard drinks    Comment: holidays  . Drug use: No  . Sexual activity: Not Currently  Lifestyle  . Physical activity:    Days per week: 0 days    Minutes per session: 0 min  . Stress: Very much  Relationships  . Social connections:    Talks on phone: Once a week    Gets together: Never    Attends religious service: Never    Active member of club or organization: No    Attends meetings of clubs or organizations: Never    Relationship  status: Widowed  Other Topics Concern  . 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 Penicillins Hives, Shortness Of Breath and Rash    Has patient had a PCN reaction causing immediate rash, facial/tongue/throat swelling, SOB or lightheadedness with hypotension: Yes Has patient had a PCN reaction causing severe rash involving mucus membranes or skin necrosis: No Has patient had a PCN reaction that required hospitalization No Has patient had a PCN reaction occurring within the last 10 years: No If all of the above answers are "NO", then may proceed with Cephalosporin use.     Metabolic Disorder Labs: No results found for: HGBA1C, MPG No results found for: PROLACTIN Lab Results  Component Value Date   CHOL 114 10/02/2016   TRIG 59 10/02/2016   HDL 55 10/02/2016   CHOLHDL 2.1 10/02/2016   VLDL 14 01/30/2015   LDLCALC 47 10/02/2016   LDLCALC 94 01/30/2015   Lab Results  Component Value Date   TSH 3.037 07/26/2017    Therapeutic Level Labs: No results found for: LITHIUM No results found for: VALPROATE No components found for:  CBMZ  Current Medications: Current Outpatient Medications  Medication Sig Dispense Refill  . albuterol (PROVENTIL HFA;VENTOLIN HFA) 108 (90 BASE) MCG/ACT inhaler Inhale 2 puffs into the lungs as needed for wheezing.    . busPIRone (BUSPAR) 15 MG tablet TAKE 1 TABLET (15 MG TOTAL) BY MOUTH 2 (TWO) TIMES DAILY. 180 tablet 0  . DULoxetine (CYMBALTA) 30 MG capsule Take 1 capsule (30 mg total) by mouth daily. To be combined with 60 mg 90 capsule 0  . DULoxetine (CYMBALTA) 60 MG capsule Take 1 capsule (60 mg total) by mouth daily. 90 capsule 0  . furosemide (LASIX) 40 MG tablet     . gabapentin (NEURONTIN) 400 MG capsule Limit 2  tablets in the a.m. and midday and 3 tablets each evening    Please dispense a three-month supply 630 capsule 0  . isosorbide mononitrate (IMDUR) 60 MG 24 hr tablet Take 1 tablet (60 mg total) by mouth daily. 90 tablet 3  . methadone (DOLOPHINE) 10 MG tablet Limit 1-2 tablets by mouth 2-3 times per day if tolerated 180 tablet 0  . nitroGLYCERIN (NITROSTAT) 0.4 MG SL tablet Place 1 tablet (0.4 mg total) under the tongue every 5 (five) minutes as needed for chest pain. 25 tablet 3  . potassium chloride SA (K-DUR,KLOR-CON) 20 MEQ tablet Take 2 tablets (40 mEq total) by mouth daily. 60 tablet 3  . rOPINIRole (REQUIP) 0.25 MG tablet Take 0.5-0.75 mg by mouth at bedtime.     . rosuvastatin (CRESTOR) 10 MG tablet TAKE 1 TABLET BY MOUTH EVERY DAY 90 tablet 1  . SUMAtriptan (IMITREX) 100 MG tablet TAKE 1 TABLET BY MOUTH AS NEEDED FOR HEADACHE. MAY REPEAT IN 2 HRS IF NEEDED. MAX 200MG/DAY  5  . SUMAtriptan (IMITREX) 50 MG tablet TAKE 1 TABLET BY MOUTH STAT, MAY REPEAT IN 2 HOURS, MAX 200MG PER DAY    . tiZANidine (ZANAFLEX) 2 MG tablet Take 2 mg by mouth 2 (two) times daily.  1  . traZODone (DESYREL) 100 MG tablet TAKE 0.5-1 TABLETS (50-100 MG TOTAL) BY MOUTH AT BEDTIME AS NEEDED FOR SLEEP. 90 tablet 1  . vitamin B-12 (CYANOCOBALAMIN) 500 MCG tablet Take 1 tablet (500 mcg total) by mouth daily. 30 tablet 6  . zolpidem (AMBIEN) 5 MG tablet Take 1 tablet (5 mg total) by mouth at bedtime as needed for sleep. 15  tablet 0   Current Facility-Administered Medications  Medication Dose Route Frequency Provider Last Rate Last Dose  . bupivacaine (PF) (MARCAINE) 0.25 % injection 30 mL  30 mL Other Once Mohammed Kindle, MD      . bupivacaine (PF) (MARCAINE) 0.25 % injection 30 mL  30 mL Other Once Mohammed Kindle, MD      . fentaNYL (SUBLIMAZE) injection 100 mcg  100 mcg Intravenous Once Mohammed Kindle, MD      . fentaNYL (SUBLIMAZE) injection 100 mcg  100 mcg Intravenous Once Mohammed Kindle, MD      . lactated ringers  infusion 1,000 mL  1,000 mL Intravenous Continuous Mohammed Kindle, MD      . lactated ringers infusion 1,000 mL  1,000 mL Intravenous Continuous Mohammed Kindle, MD      . lactated ringers infusion 1,000 mL  1,000 mL Intravenous Continuous Mohammed Kindle, MD 125 mL/hr at 08/06/15 1002 1,000 mL at 08/06/15 1002  . lidocaine (PF) (XYLOCAINE) 1 % injection 10 mL  10 mL Subcutaneous Once Mohammed Kindle, MD      . midazolam (VERSED) 5 MG/5ML injection 5 mg  5 mg Intravenous Once Mohammed Kindle, MD      . orphenadrine (NORFLEX) injection 60 mg  60 mg Intramuscular Once Mohammed Kindle, MD      . orphenadrine (NORFLEX) injection 60 mg  60 mg Intramuscular Once Mohammed Kindle, MD      . triamcinolone acetonide (KENALOG-40) injection 40 mg  40 mg Other Once Mohammed Kindle, MD      . triamcinolone acetonide (KENALOG-40) injection 40 mg  40 mg Other Once Mohammed Kindle, MD         Musculoskeletal: Strength & Muscle Tone: within normal limits Gait & Station: normal Patient leans: N/A  Psychiatric Specialty Exam: Review of Systems  Psychiatric/Behavioral: Positive for depression. The patient is nervous/anxious and has insomnia.   All other systems reviewed and are negative.   Blood pressure 126/82, pulse 61, weight 221 lb 9.6 oz (100.5 kg), last menstrual period 02/18/2016.Body mass index is 36.88 kg/m.  General Appearance: Casual  Eye Contact:  Fair  Speech:  Clear and Coherent  Volume:  Normal  Mood:  Dysphoric  Affect:  Congruent  Thought Process:  Goal Directed and Descriptions of Associations: Intact  Orientation:  Full (Time, Place, and Person)  Thought Content: Logical   Suicidal Thoughts:  No  Homicidal Thoughts:  No  Memory:  Immediate;   Fair Recent;   Fair Remote;   Fair  Judgement:  Fair  Insight:  Fair  Psychomotor Activity:  Normal  Concentration:  Concentration: Fair and Attention Span: Fair  Recall:  [employer] of Knowledge: Fair  Language: Fair  Akathisia:  No  Handed:   Right  AIMS (if indicated):denies tremors, rigidity,stiffness  Assets:  Communication Skills Desire for Improvement Housing  ADL's:  Intact  Cognition: WNL  Sleep:  restless   Screenings: GAD-7     Office Visit from 09/21/2015 in [facility]  Total GAD-7 Score  4    Mini-Mental     Office Visit from 10/30/2017 in [facility]  Total Score (max 30 points )  27    PHQ2-9     Office Visit from 08/18/2017 in [facility] Office Visit from 11/15/2015 in [place] Office Visit from 10/18/2015 in [place] Office Visit from 09/21/2015 in [place]  [place] Office Visit from 07/25/2015 in [facility]  PHQ-2 Total Score  0  0  0  0  0       Assessment and Plan: Cheyenne Gray is a 52 year old Caucasian female, widowed, has a history of depression, anxiety, multiple medical problems including coronary artery disease, history of NSTEMI, long QT per history, gastric bypass, vitamin D, vitamin B12 deficiency, chronic pain on methadone, presented to the clinic today for a follow-up visit.  Patient is biologically predisposed given her history of medical problems, trauma as well as family history of mental health problems.  She also has psychosocial stressors of being widowed, several deaths in the family, multiple health problems, relationship struggles, recent relocation and so on.  Patient continues to struggle with mood symptoms and sleep problems.  She will continue to benefit from medication readjustment and psychotherapy.  Plan PTSD Cymbalta 90 mg p.o. daily BuSpar 15 mg p.o. twice daily Continue psychotherapy with therapist Ms. Alden Hipp.  For MDD Cymbalta 90 mg p.o. daily Continue psychotherapy  For GAD BuSpar and Cymbalta as prescribed  For her  insomnia Ambien 5 mg p.o. nightly as needed.  Patient has not started it yet. This with patient to stop the trazodone when she starts the Ambien.  Caffeine use disorder Provided counseling.  Memory problems MMSE was completed on 11/06/2017-30 out of 30 Patient will continue vitamin B12 and vitamin D management with her primary medical doctor  For attention and concentration problems Discussed with her to get QB check and provided her information.  Opiate dependence in controlled environment on methadone She will continue to follow-up with her pain provider  Follow-up in clinic in 2 weeks or sooner if needed.  More than 50 % of the time was spent for psychoeducation and supportive psychotherapy and care coordination.  This note was generated in part or whole with voice recognition software. Voice recognition is usually quite accurate but there are transcription errors that can and very often do occur. I apologize for any typographical errors that were not detected and corrected.       Ursula Alert, MD 03/15/2018, 4:36 PM

## 2018-03-25 ENCOUNTER — Ambulatory Visit: Payer: Medicare Other | Admitting: Cardiovascular Disease

## 2018-03-29 ENCOUNTER — Other Ambulatory Visit: Payer: Self-pay | Admitting: Internal Medicine

## 2018-03-29 ENCOUNTER — Other Ambulatory Visit: Payer: Self-pay

## 2018-03-29 ENCOUNTER — Encounter: Payer: Self-pay | Admitting: Psychiatry

## 2018-03-29 ENCOUNTER — Ambulatory Visit (INDEPENDENT_AMBULATORY_CARE_PROVIDER_SITE_OTHER): Payer: Medicare Other | Admitting: Psychiatry

## 2018-03-29 VITALS — BP 139/72 | HR 64 | Temp 97.6°F | Wt 222.2 lb

## 2018-03-29 DIAGNOSIS — R41840 Attention and concentration deficit: Secondary | ICD-10-CM

## 2018-03-29 DIAGNOSIS — F411 Generalized anxiety disorder: Secondary | ICD-10-CM

## 2018-03-29 DIAGNOSIS — F4310 Post-traumatic stress disorder, unspecified: Secondary | ICD-10-CM | POA: Diagnosis not present

## 2018-03-29 DIAGNOSIS — F1590 Other stimulant use, unspecified, uncomplicated: Secondary | ICD-10-CM

## 2018-03-29 DIAGNOSIS — F331 Major depressive disorder, recurrent, moderate: Secondary | ICD-10-CM | POA: Diagnosis not present

## 2018-03-29 DIAGNOSIS — G4700 Insomnia, unspecified: Secondary | ICD-10-CM | POA: Diagnosis not present

## 2018-03-29 DIAGNOSIS — F1120 Opioid dependence, uncomplicated: Secondary | ICD-10-CM

## 2018-03-29 NOTE — Telephone Encounter (Signed)
Requested medication (s) are due for refill today: yes  Requested medication (s) are on the active medication list: yes  Last refill:  Last filled by historical provider  Future visit scheduled: yes, 03/30/18  Notes to clinic:  Last refilled by historical provider    Requested Prescriptions  Pending Prescriptions Disp Refills   rOPINIRole (REQUIP) 0.25 MG tablet      Sig: Take 2-3 tablets (0.5-0.75 mg total) by mouth at bedtime.     Neurology:  Parkinsonian Agents Passed - 03/29/2018  3:34 PM      Passed - Last BP in normal range    BP Readings from Last 1 Encounters:  03/04/18 128/90         Passed - Valid encounter within last 12 months    Recent Outpatient Visits          6 months ago Edema, unspecified type   [facility] [place] McLean-Scocuzza, Pasty Spillers, MD   7 months ago Elevated LFTs   [facility] [place] McLean-Scocuzza, Pasty Spillers, MD

## 2018-03-29 NOTE — Progress Notes (Signed)
[facility] OP Progress Note  03/29/2018 5:49 PM Cheyenne Gray  MRN:  [id number]  Chief Complaint: ' I am here for follow up." Chief Complaint    Follow-up; Medication Refill     HPI: Cheyenne Gray is a 53 year old Caucasian female, widowed, on disability, has a history of depression, anxiety, multiple medical problems like heart failure, vitamin B12 deficiency, history of prolonged QT, chronic back pain on methadone, vitamin D deficiency, iron deficiency, history of gastric bypass, non-ST elevation MI, hypertension, presented to the clinic today for a follow-up visit.  Patient today reports she continues to struggle with sleep.  She reports the Ambien helps her to get a good night sleep.  She however has not been taking it on a regular basis.  She reports that she takes it only when her daughter's home.  She reports she does not take it if she is alone since she wants to be aware of her surroundings.  She also reports some nausea on and off and wonders whether it is due to the higher dosage of Cymbalta.  Discussed with patient that she could go down on the Cymbalta for a few days to see if that helps if her nausea continues to be a problem.  She agrees with plan.  Patient denies any other concerns today.  She denies any suicidality, perceptual disturbances. Visit Diagnosis:    ICD-10-CM   1. PTSD (post-traumatic stress disorder) F43.10   2. GAD (generalized anxiety disorder) F41.1   3. MDD (major depressive disorder), recurrent episode, moderate (HCC) F33.1   4. Insomnia, unspecified type G47.00   5. 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 Opioid dependence in controlled environment ([place]) F11.20   7. Attention and concentration deficit R41.840     Past Psychiatric History: I have reviewed past psychiatric history from my progress note on 11/06/2017.  Past trials of Paxil, Cymbalta, Valium, melatonin, trazodone  Past Medical History:  Past Medical History:  Diagnosis Date  . (HFpEF) heart failure  with preserved ejection fraction ([place])    a. Echo 2014: EF 65-70%, nl WM, mildly dilated LA, PASP nl; b. 12/2014 Echo: EF 65-70%, no rwma, mod septal hypertrophy w/o LVOT gradient or SAM; c. 07/2017 Echo: EF 55-60%, no rwma, mildly dil RV w/ nl syst fxn. Mildly dil RA. Dilated IVC w/ elevated CVP. Triv post effusion.  Marland Kitchen Anxiety   . Arthritis   . Asthma   . B12 deficiency   . Chronic back pain   . Chronic headaches   . Chronic pain    a. on methadone  . Concussion    hx of 4  . Coronary artery disease, non-occlusive    a. LHC 1/18: proximal to mid LAD 40% stenosed, mid LAD 30% stenosed, mid RCA 20% stenosed, distal RCA 20% stenosed, EF 55-65%, LVEDP normal  . Depression   . DJD (degenerative joint disease), multiple sites   . Frequent headaches   . Gallstone   . H/O non-ST elevation myocardial infarction (NSTEMI)   . Hand, foot and mouth disease 2016  . History of shingles   . Hypertension   . Iron deficiency anemia   . Long QT interval   . Methadone use ([place])    managed by Dr. Primus Bravo  . Migraine   . Obesity   . Palpitations    a. 24 hour Holter: NSR, sinus brady down to 48, occasional PVCs & couplets, 8 beats NSVT; b. 30 day event monitor 2015: NSR with rare PVC.  Marland Kitchen Psoriasis   .  Syncope and collapse   . Vitamin D deficiency     Past Surgical History:  Procedure Laterality Date  . ABDOMINOPLASTY     tummy tuck ? year   . Ernest SURGERY  2001  . CARDIAC CATHETERIZATION Left 04/16/2016   Procedure: Left Heart Cath and Coronary Angiography;  Surgeon: [name], MD;  Location: [facility];  Service: Cardiovascular;  Laterality: Left;  . CHOLECYSTECTOMY  2001  . GALLBLADDER SURGERY    . GASTRIC BYPASS  2001  . GASTROPLASTY      Family Psychiatric History: Reviewed family psychiatric history from my progress note on 11/06/2017.  Family History:  Family History  Problem Relation Age of Onset  . Heart attack Mother   . Cancer Mother        pancreatitic    . Early death Mother   . Heart attack Father 81       MI  . Early death Father   . Heart disease Father   . Heart attack Brother   . Heart disease Brother   . Arthritis Brother   . Depression Brother   . Diabetes Brother   . Heart attack Maternal Grandmother   . Cancer Maternal Grandmother        pancreatitic   . Heart disease Maternal Grandmother   . Cancer Maternal Uncle        pancreatitic   . Cancer Paternal Grandmother        ? type   . Diabetes Paternal Grandmother   . Cancer Maternal Uncle        pancreatitic   . Breast cancer Maternal Aunt     Social History: Reviewed social history from my progress note on 11/06/2017. Social History   Socioeconomic History  . Marital status: Widowed    Spouse name: Not on file  . Number of children: 1  . Years of education: assoc degree  . Highest education level: Associate degree: occupational, Hotel manager, or vocational program  Occupational History  . Not on file  Social Needs  . Financial resource strain: Very hard  . Food insecurity:    Worry: Never true    Inability: Never true  . Transportation needs:    Medical: No    Non-medical: No  Tobacco Use  . Smoking status: Never Smoker  . Smokeless tobacco: Never Used  Substance and Sexual Activity  . Alcohol use: No    Alcohol/week: 0.0 standard drinks    Comment: holidays  . Drug use: No  . Sexual activity: Not Currently  Lifestyle  . Physical activity:    Days per week: 0 days    Minutes per session: 0 min  . Stress: Very much  Relationships  . Social connections:    Talks on phone: Once a week    Gets together: Never    Attends religious service: Never    Active member of club or organization: No    Attends meetings of clubs or organizations: Never    Relationship status: Widowed  Other Topics Concern  . 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 Penicillins Hives, Shortness Of Breath and Rash  Has patient had a PCN reaction causing immediate rash, facial/tongue/throat swelling, SOB or lightheadedness with hypotension: Yes Has patient had a PCN reaction causing severe rash involving mucus membranes or skin necrosis: No Has patient had a PCN reaction that required hospitalization No Has patient had a PCN reaction occurring within the last 10 years: No If all of the above answers are "NO", then may proceed with Cephalosporin use.     Metabolic Disorder Labs: No results found for: HGBA1C, MPG No results found for: PROLACTIN Lab Results  Component Value Date   CHOL 114 10/02/2016   TRIG 59 10/02/2016   HDL 55 10/02/2016   CHOLHDL 2.1 10/02/2016   VLDL 14 01/30/2015   LDLCALC 47 10/02/2016   LDLCALC 94 01/30/2015   Lab Results  Component Value Date   TSH 3.037 07/26/2017    Therapeutic Level Labs: No results found for: LITHIUM No results found for: VALPROATE No components found for:  CBMZ  Current Medications: Current Outpatient Medications  Medication Sig Dispense Refill  . albuterol (PROVENTIL HFA;VENTOLIN HFA) 108 (90 BASE) MCG/ACT inhaler Inhale 2 puffs into the lungs as needed for wheezing.    . busPIRone (BUSPAR) 15 MG tablet TAKE 1 TABLET (15 MG TOTAL) BY MOUTH 2 (TWO) TIMES DAILY. 180 tablet 0  . DULoxetine (CYMBALTA) 30 MG capsule Take 1 capsule (30 mg total) by mouth daily. To be combined with 60 mg 90 capsule 0  . DULoxetine (CYMBALTA) 60 MG capsule Take 1 capsule (60 mg total) by mouth daily. 90 capsule 0  . furosemide (LASIX) 40 MG tablet     . gabapentin (NEURONTIN) 400 MG capsule Limit 2 tablets in the a.m. and midday and 3 tablets each evening    Please dispense a three-month supply 630 capsule 0  . isosorbide mononitrate (IMDUR) 60 MG 24 hr tablet Take 1 tablet (60 mg total) by  mouth daily. 90 tablet 3  . methadone (DOLOPHINE) 10 MG tablet Limit 1-2 tablets by mouth 2-3 times per day if tolerated 180 tablet 0  . nitroGLYCERIN (NITROSTAT) 0.4 MG SL tablet Place 1 tablet (0.4 mg total) under the tongue every 5 (five) minutes as needed for chest pain. 25 tablet 3  . potassium chloride SA (K-DUR,KLOR-CON) 20 MEQ tablet Take 2 tablets (40 mEq total) by mouth daily. 60 tablet 3  . rOPINIRole (REQUIP) 0.25 MG tablet Take 0.5-0.75 mg by mouth at bedtime.     . rosuvastatin (CRESTOR) 10 MG tablet TAKE 1 TABLET BY MOUTH EVERY DAY 90 tablet 1  . SUMAtriptan (IMITREX) 100 MG tablet TAKE 1 TABLET BY MOUTH AS NEEDED FOR HEADACHE. MAY REPEAT IN 2 HRS IF NEEDED. MAX 200MG/DAY  5  . SUMAtriptan (IMITREX) 50 MG tablet TAKE 1 TABLET BY MOUTH STAT, MAY REPEAT IN 2 HOURS, MAX 200MG PER DAY    . tiZANidine (ZANAFLEX) 2 MG tablet Take 2 mg by mouth 2 (two) times daily.  1  . traZODone (DESYREL) 100 MG tablet TAKE 0.5-1 TABLETS (50-100 MG TOTAL) BY MOUTH AT BEDTIME AS NEEDED FOR SLEEP. 90 tablet 1  . vitamin B-12 (CYANOCOBALAMIN) 500 MCG tablet Take 1 tablet (500 mcg total) by mouth daily. 30 tablet 6  . zolpidem (AMBIEN) 5 MG tablet Take 1 tablet (5 mg total) by mouth at bedtime as needed for sleep. 15 tablet 0   Current Facility-Administered Medications  Medication Dose Route Frequency Provider Last Rate Last Dose  . bupivacaine (PF) (MARCAINE) 0.25 % injection 30 mL  30 mL Other Once  Mohammed Kindle, MD      . bupivacaine (PF) (MARCAINE) 0.25 % injection 30 mL  30 mL Other Once Mohammed Kindle, MD      . fentaNYL (SUBLIMAZE) injection 100 mcg  100 mcg Intravenous Once Mohammed Kindle, MD      . fentaNYL (SUBLIMAZE) injection 100 mcg  100 mcg Intravenous Once Mohammed Kindle, MD      . lactated ringers infusion 1,000 mL  1,000 mL Intravenous Continuous Mohammed Kindle, MD      . lactated ringers infusion 1,000 mL  1,000 mL Intravenous Continuous Mohammed Kindle, MD      . lactated ringers  infusion 1,000 mL  1,000 mL Intravenous Continuous Mohammed Kindle, MD 125 mL/hr at 08/06/15 1002 1,000 mL at 08/06/15 1002  . lidocaine (PF) (XYLOCAINE) 1 % injection 10 mL  10 mL Subcutaneous Once Mohammed Kindle, MD      . midazolam (VERSED) 5 MG/5ML injection 5 mg  5 mg Intravenous Once Mohammed Kindle, MD      . orphenadrine (NORFLEX) injection 60 mg  60 mg Intramuscular Once Mohammed Kindle, MD      . orphenadrine (NORFLEX) injection 60 mg  60 mg Intramuscular Once Mohammed Kindle, MD      . triamcinolone acetonide (KENALOG-40) injection 40 mg  40 mg Other Once Mohammed Kindle, MD      . triamcinolone acetonide (KENALOG-40) injection 40 mg  40 mg Other Once Mohammed Kindle, MD         Musculoskeletal: Strength & Muscle Tone: within normal limits Gait & Station: normal Patient leans: N/A  Psychiatric Specialty Exam: Review of Systems  Gastrointestinal: Positive for nausea.  Psychiatric/Behavioral: The patient has insomnia.   All other systems reviewed and are negative.   Blood pressure 139/72, pulse 64, temperature 97.6 F (36.4 C), temperature source Oral, weight 222 lb 3.2 oz (100.8 kg), last menstrual period 02/18/2016.Body mass index is 36.98 kg/m.  General Appearance: Casual  Eye Contact:  Fair  Speech:  Clear and Coherent  Volume:  Normal  Mood:  Euthymic  Affect:  Congruent  Thought Process:  Goal Directed and Descriptions of Associations: Intact  Orientation:  Full (Time, Place, and Person)  Thought Content: Logical   Suicidal Thoughts:  No  Homicidal Thoughts:  No  Memory:  Immediate;   Fair Recent;   Fair Remote;   Fair  Judgement:  Fair  Insight:  Fair  Psychomotor Activity:  Normal  Concentration:  Concentration: Fair and Attention Span: Fair  Recall:  [employer] of Knowledge: Fair  Language: Fair  Akathisia:  No  Handed:  Right  AIMS (if indicated): Denies tremors, rigidity, stiffness  Assets:  Communication Skills Desire for Improvement Social Support   ADL's:  Intact  Cognition: WNL  Sleep:  restless   Screenings: GAD-7     Office Visit from 09/21/2015 in [place]  Total GAD-7 Score  4    Mini-Mental     Office Visit from 10/30/2017 in [facility]  Total Score (max 30 points )  27    PHQ2-9     Office Visit from 08/18/2017 in [facility] Office Visit from 11/15/2015 in [place] Office Visit from 10/18/2015 in [place] Office Visit from 09/21/2015 in [facility] Office Visit from 07/25/2015 in [facility]  PHQ-2 Total Score  0  0  0  0  0       Assessment and Plan: Cheyenne Gray is a 53 year old Caucasian female, widowed, has a history of depression, anxiety, multiple medical problems including coronary artery disease, history of NSTEMI, long QT per history, gastric bypass, vitamin D, vitamin B12 deficiency, chronic pain on methadone, presented to the clinic today for a follow-up visit.  Patient is biologically predisposed given her history of medical problems, trauma as well as family history of mental health issues.  She also has psychosocial stressors of being widowed, several deaths in the family, relationship struggles and recent relocation.  Patient continues to struggle with sleep.  Discussed plan as noted below.  Plan PTSD-unstable Cymbalta 90 mg p.o. daily.  Discussed with her to reduce the dosage to 60 mg for a few days since she has nausea. BuSpar 15 mg p.o. twice daily Continue psychotherapy with therapist here in clinic.  For MDD-unstable Cymbalta 90 mg p.o. daily.  Discussed plan to reduce dosage to see if it will help with adverse side effects. Continue psychotherapy.  GAD-improving BuSpar and Cymbalta as prescribed  For insomnia-unstable Ambien as prescribed.  Patient  has been noncompliant with medication.  Provided education and encourage compliance.  Caffeine use disorder-unstable Provided counseling.  For memory problems Patient will continue B12 and vitamin D management with her primary medical doctor.  MMSE was completed on 11/06/2017-30 out of 30.  For attention and concentration problems She has been referred for ADHD testing-pending  Opioid dependence on methadone She will continue to follow-up with her pain provider.  Follow-up in clinic in 3 to 4 weeks or sooner if needed.  I have spent atleast 15 minutes face to face with patient today. More than 50 % of the time was spent for psychoeducation and supportive psychotherapy and care coordination.  This note was generated in part or whole with voice recognition software. Voice recognition is usually quite accurate but there are transcription errors that can and very often do occur. I apologize for any typographical errors that were not detected and corrected.         Ursula Alert, MD 03/29/2018, 5:49 PM

## 2018-03-29 NOTE — Telephone Encounter (Signed)
Copied from CRM [id number]. Topic: Quick Communication - Rx Refill/Question >> Mar 29, 2018  3:12 PM Percival Spanish wrote: Medication rOPINIRole (REQUIP) 0.25 MG tablet   Has the patient contacted their pharmacy yes   (Preferred Pharmacy    [facility]   Agent: Please be advised that RX refills may take up to 3 business days. We ask that you follow-up with your pharmacy.

## 2018-03-30 ENCOUNTER — Ambulatory Visit (INDEPENDENT_AMBULATORY_CARE_PROVIDER_SITE_OTHER): Payer: Medicare Other | Admitting: Internal Medicine

## 2018-03-30 ENCOUNTER — Ambulatory Visit (INDEPENDENT_AMBULATORY_CARE_PROVIDER_SITE_OTHER): Payer: Medicare Other

## 2018-03-30 ENCOUNTER — Encounter: Payer: Self-pay | Admitting: Internal Medicine

## 2018-03-30 VITALS — BP 120/76 | HR 76 | Temp 98.1°F | Ht 66.0 in | Wt 223.2 lb

## 2018-03-30 DIAGNOSIS — R937 Abnormal findings on diagnostic imaging of other parts of musculoskeletal system: Secondary | ICD-10-CM

## 2018-03-30 DIAGNOSIS — Z9884 Bariatric surgery status: Secondary | ICD-10-CM

## 2018-03-30 DIAGNOSIS — R748 Abnormal levels of other serum enzymes: Secondary | ICD-10-CM

## 2018-03-30 DIAGNOSIS — M25562 Pain in left knee: Secondary | ICD-10-CM

## 2018-03-30 DIAGNOSIS — M25561 Pain in right knee: Secondary | ICD-10-CM | POA: Diagnosis not present

## 2018-03-30 DIAGNOSIS — E538 Deficiency of other specified B group vitamins: Secondary | ICD-10-CM | POA: Diagnosis not present

## 2018-03-30 DIAGNOSIS — M5134 Other intervertebral disc degeneration, thoracic region: Secondary | ICD-10-CM

## 2018-03-30 DIAGNOSIS — M25552 Pain in left hip: Secondary | ICD-10-CM

## 2018-03-30 DIAGNOSIS — M5410 Radiculopathy, site unspecified: Secondary | ICD-10-CM

## 2018-03-30 DIAGNOSIS — M5135 Other intervertebral disc degeneration, thoracolumbar region: Secondary | ICD-10-CM

## 2018-03-30 DIAGNOSIS — F40240 Claustrophobia: Secondary | ICD-10-CM

## 2018-03-30 DIAGNOSIS — F32A Depression, unspecified: Secondary | ICD-10-CM

## 2018-03-30 DIAGNOSIS — E876 Hypokalemia: Secondary | ICD-10-CM

## 2018-03-30 DIAGNOSIS — M25551 Pain in right hip: Secondary | ICD-10-CM | POA: Diagnosis not present

## 2018-03-30 DIAGNOSIS — G8929 Other chronic pain: Secondary | ICD-10-CM

## 2018-03-30 DIAGNOSIS — F419 Anxiety disorder, unspecified: Secondary | ICD-10-CM

## 2018-03-30 DIAGNOSIS — F329 Major depressive disorder, single episode, unspecified: Secondary | ICD-10-CM

## 2018-03-30 MED ORDER — PREDNISONE 20 MG PO TABS: 20.0000 mg | ORAL_TABLET | Freq: Every day | ORAL | 0 refills | Status: DC

## 2018-03-30 MED ORDER — CYANOCOBALAMIN 1000 MCG/ML IJ SOLN
1000.0000 ug | Freq: Once | INTRAMUSCULAR | Status: AC
  Administered 2018-03-30: 1000 ug via INTRAMUSCULAR

## 2018-03-30 MED ORDER — DIAZEPAM 5 MG PO TABS: 5.0000 mg | ORAL_TABLET | Freq: Once | ORAL | 0 refills | Status: DC | PRN

## 2018-03-30 NOTE — Patient Instructions (Signed)
Lumbosacral Radiculopathy  Lumbosacral radiculopathy is a condition that involves the spinal nerves and nerve roots in the low back and bottom of the spine. The condition develops when these nerves and nerve roots move out of place or become inflamed and cause symptoms.  What are the causes?  This condition may be caused by:  · Pressure from a disk that bulges out of place (herniated disk). A disk is a plate of soft cartilage that separates bones in the spine.  · Disk changes that occur with age (disk degeneration).  · A narrowing of the bones of the lower back (spinal stenosis).  · A tumor.  · An infection.  · An injury that places sudden pressure on the disks that cushion the bones of your lower spine.  What increases the risk?  You are more likely to develop this condition if:  · You are a female who is 30-50 years old.  · You are a female who is 50-60 years old.  · You use improper technique when lifting things.  · You are overweight or live a sedentary lifestyle.  · Your work requires frequent lifting.  · You smoke.  · You do repetitive activities that strain the spine.  What are the signs or symptoms?  Symptoms of this condition include:  · Pain that goes down from your back into your legs (sciatica), usually on one side of the body. This is the most common symptom. The pain may be worse with sitting, coughing, or sneezing.  · Pain and numbness in your legs.  · Muscle weakness.  · Tingling.  · Loss of bladder control or bowel control.  How is this diagnosed?  This condition may be diagnosed based on:  · Your symptoms and medical history.  · A physical exam.  If the pain is lasting, you may have tests, such as:  · MRI scan.  · X-ray.  · CT scan.  · A type of X-ray used to examine the spinal canal after injecting a dye into your spine (myelogram).  · A test to measure how electrical impulses move through a nerve (nerve conduction study).  How is this treated?  Treatment may depend on the cause of the condition and  may include:  · Working with a physical therapist.  · Taking pain medicine.  · Applying heat and ice to affected areas.  · Doing stretches to improve flexibility.  · Doing exercises to strengthen back muscles.  · Having chiropractic spinal manipulation.  · Using transcutaneous electrical nerve stimulation (TENS) therapy.  · Getting a steroid injection in the spine.  In some cases, no treatment is needed. If the condition is long-lasting (chronic), or if symptoms are severe, treatment may involve surgery or lifestyle changes, such as following a weight-loss plan.  Follow these instructions at home:  Activity  · Avoid bending and other activities that make the problem worse.  · Maintain a proper position when standing or sitting:  ? When standing, keep your upper back and neck straight, with your shoulders pulled back. Avoid slouching.  ? When sitting, keep your back straight and relax your shoulders. Do not round your shoulders or pull them backward.  · Do not sit or stand in one place for long periods of time.  · Take brief periods of rest throughout the day. This will reduce your pain. It is usually better to rest by lying down or standing, not sitting.  · When you are resting for longer periods, mix   in some mild activity or stretching between periods of rest. This will help to prevent stiffness and pain.  · Get regular exercise. Ask your health care provider what activities are safe for you. If you were shown how to do any exercises or stretches, do them as directed by your health care provider.  · Do not lift anything that is heavier than 10 lb (4.5 kg) or the limit that you are told by your health care provider. Always use proper lifting technique, which includes:  ? Bending your knees.  ? Keeping the load close to your body.  ? Avoiding twisting.  Managing pain  · If directed, put ice on the affected area:  ? Put ice in a plastic bag.  ? Place a towel between your skin and the bag.  ? Leave the ice on for 20  minutes, 2-3 times a day.  · If directed, apply heat to the affected area as often as told by your health care provider. Use the heat source that your health care provider recommends, such as a moist heat pack or a heating pad.  ? Place a towel between your skin and the heat source.  ? Leave the heat on for 20-30 minutes.  ? Remove the heat if your skin turns bright red. This is especially important if you are unable to feel pain, heat, or cold. You may have a greater risk of getting burned.  · Take over-the-counter and prescription medicines only as told by your health care provider.  General instructions  · Sleep on a firm mattress in a comfortable position. Try lying on your side with your knees slightly bent. If you lie on your back, put a pillow under your knees.  · Do not drive or use heavy machinery while taking prescription pain medicine.  · If your health care provider prescribed a diet or exercise program, follow it as directed.  · Keep all follow-up visits as told by your health care provider. This is important.  Contact a health care provider if:  · Your pain does not improve over time, even when taking pain medicines.  Get help right away if:  · You develop severe pain.  · Your pain suddenly gets worse.  · You develop increasing weakness in your legs.  · You lose the ability to control your bladder or bowel.  · You have difficulty walking or balancing.  · You have a fever.  Summary  · Lumbosacral radiculopathy is a condition that occurs when the spinal nerves and nerve roots in the lower part of the spine move out of place or become inflamed and cause symptoms.  · Symptoms include pain, numbness, and tingling that go down from your back into your legs (sciatica), muscle weakness, and loss of bladder control or bowel control.  · If directed, apply ice or heat to the affected area as told by your health care provider.  · Follow instructions about activity, rest, and proper lifting technique.  This  information is not intended to replace advice given to you by your health care provider. Make sure you discuss any questions you have with your health care provider.  Document Released: 03/03/2005 Document Revised: 02/19/2017 Document Reviewed: 02/19/2017  [employer]vier Interactive Patient Education © 2019 [employer]

## 2018-03-30 NOTE — Progress Notes (Signed)
Chief Complaint  Patient presents with  . Acute Visit   Acute visit  1. C/o knee pain b/l, b/l hip pain and pain "Sciatic" running down legs R>L all the way down right leg and left leg to the knee. Reviewed MRI L spine 10/2016 abnormal with DDD, stenosis, herniated discs and abnormal T spine MRI had steroid shots in the past w/o relief with Dr. Primus Bravo now in [place]. ON Gabapentin 400 mg 2 pills in am 2 pm and 3 mg qhs on cymbalta 90 mg qd and does not help, on methadone as well with pain clinic Dr. Stephanie Coup. She has had back pain since 20 years when deck collapsed on her after she fell 10 fts.  Pain is 50/10 worse with walking and standing and activities limited   2. hypoK given high K food list and rec take kdur 40 mg qd per cardiology  3. S/p gastric bypass due b12 shot q6 months wants today  4. Anxiety/depression uncontrolled rec f/u with psych she is having crying spells on current medications  5. Elevated lfts never did US abdomen w/u fatty liver will re-order   Review of Systems  Constitutional: Negative for weight loss.  HENT: Negative for hearing loss.   Eyes: Negative for blurred vision.  Respiratory: Negative for shortness of breath.   Cardiovascular: Negative for chest pain.  Gastrointestinal: Negative for abdominal pain.  Musculoskeletal: Positive for back pain and joint pain. Negative for falls.  Skin: Negative for rash.  Neurological: Negative for headaches.  Psychiatric/Behavioral: Positive for depression. The patient is nervous/anxious.    Past Medical History:  Diagnosis Date  . (HFpEF) heart failure with preserved ejection fraction ([place])    a. Echo 2014: EF 65-70%, nl WM, mildly dilated LA, PASP nl; b. 12/2014 Echo: EF 65-70%, no rwma, mod septal hypertrophy w/o LVOT gradient or SAM; c. 07/2017 Echo: EF 55-60%, no rwma, mildly dil RV w/ nl syst fxn. Mildly dil RA. Dilated IVC w/ elevated CVP. Triv post effusion.  Marland Kitchen Anxiety   . Arthritis   . Asthma   . B12 deficiency   .  Chronic back pain   . Chronic headaches   . Chronic pain    a. on methadone  . Concussion    hx of 4  . Coronary artery disease, non-occlusive    a. LHC 1/18: proximal to mid LAD 40% stenosed, mid LAD 30% stenosed, mid RCA 20% stenosed, distal RCA 20% stenosed, EF 55-65%, LVEDP normal  . Depression   . DJD (degenerative joint disease), multiple sites   . Frequent headaches   . Gallstone   . H/O non-ST elevation myocardial infarction (NSTEMI)   . Hand, foot and mouth disease 2016  . History of shingles   . Hypertension   . Iron deficiency anemia   . Long QT interval   . Methadone use ([place])    managed by Dr. Primus Bravo  . Migraine   . Obesity   . Palpitations    a. 24 hour Holter: NSR, sinus brady down to 48, occasional PVCs & couplets, 8 beats NSVT; b. 30 day event monitor 2015: NSR with rare PVC.  Marland Kitchen Psoriasis   . Syncope and collapse   . Vitamin D deficiency    Past Surgical History:  Procedure Laterality Date  . ABDOMINOPLASTY     tummy tuck ? year   . Accident SURGERY  2001  . CARDIAC CATHETERIZATION Left 04/16/2016   Procedure: Left Heart Cath and Coronary Angiography;  Surgeon: Christia Reading  Gloriajean Dell, MD;  Location: [facility];  Service: Cardiovascular;  Laterality: Left;  . CHOLECYSTECTOMY  2001  . GALLBLADDER SURGERY    . GASTRIC BYPASS  2001  . GASTROPLASTY     Family History  Problem Relation Age of Onset  . Heart attack Mother   . Cancer Mother        pancreatitic   . Early death Mother   . Heart attack Father 29       MI  . Early death Father   . Heart disease Father   . Heart attack Brother   . Heart disease Brother   . Arthritis Brother   . Depression Brother   . Diabetes Brother   . Heart attack Maternal Grandmother   . Cancer Maternal Grandmother        pancreatitic   . Heart disease Maternal Grandmother   . Cancer Maternal Uncle        pancreatitic   . Cancer Paternal Grandmother        ? type   . Diabetes Paternal Grandmother   . Cancer  Maternal Uncle        pancreatitic   . Breast cancer Maternal Aunt    Social History   Socioeconomic History  . Marital status: Widowed    Spouse name: Not on file  . Number of children: 1  . Years of education: assoc degree  . Highest education level: Associate degree: occupational, Hotel manager, or vocational program  Occupational History  . Not on file  Social Needs  . Financial resource strain: Very hard  . Food insecurity:    Worry: Never true    Inability: Never true  . Transportation needs:    Medical: No    Non-medical: No  Tobacco Use  . Smoking status: Never Smoker  . Smokeless tobacco: Never Used  Substance and Sexual Activity  . Alcohol use: No    Alcohol/week: 0.0 standard drinks    Comment: holidays  . Drug use: No  . Sexual activity: Not Currently  Lifestyle  . Physical activity:    Days per week: 0 days    Minutes per session: 0 min  . Stress: Very much  Relationships  . Social connections:    Talks on phone: Once a week    Gets together: Never    Attends religious service: Never    Active member of club or organization: No    Attends meetings of clubs or organizations: Never    Relationship status: Widowed  . Intimate partner violence:    Fear of current or ex partner: No    Emotionally abused: No    Physically abused: No    Forced sexual activity: No  Other Topics Concern  . 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 albuterol (PROVENTIL HFA;VENTOLIN HFA) 108 (90 BASE) MCG/ACT inhaler Inhale 2 puffs into the lungs as needed for wheezing.  . busPIRone (BUSPAR) 15 MG tablet TAKE 1 TABLET (15 MG TOTAL) BY MOUTH 2 (TWO) TIMES DAILY.  . DULoxetine (CYMBALTA) 30 MG capsule Take 1 capsule (30 mg total) by mouth daily. To be combined with  60 mg  . DULoxetine (CYMBALTA)  60 MG capsule Take 1 capsule (60 mg total) by mouth daily.  . furosemide (LASIX) 40 MG tablet   . gabapentin (NEURONTIN) 400 MG capsule Limit 2 tablets in the a.m. and midday and 3 tablets each evening    Please dispense a three-month supply  . isosorbide mononitrate (IMDUR) 60 MG 24 hr tablet Take 1 tablet (60 mg total) by mouth daily.  . methadone (DOLOPHINE) 10 MG tablet Limit 1-2 tablets by mouth 2-3 times per day if tolerated  . nitroGLYCERIN (NITROSTAT) 0.4 MG SL tablet Place 1 tablet (0.4 mg total) under the tongue every 5 (five) minutes as needed for chest pain.  . potassium chloride SA (K-DUR,KLOR-CON) 20 MEQ tablet Take 2 tablets (40 mEq total) by mouth daily.  Marland Kitchen rOPINIRole (REQUIP) 0.25 MG tablet Take 0.5-0.75 mg by mouth at bedtime.   . rosuvastatin (CRESTOR) 10 MG tablet TAKE 1 TABLET BY MOUTH EVERY DAY  . SUMAtriptan (IMITREX) 100 MG tablet TAKE 1 TABLET BY MOUTH AS NEEDED FOR HEADACHE. MAY REPEAT IN 2 HRS IF NEEDED. MAX 200MG/DAY  . SUMAtriptan (IMITREX) 50 MG tablet TAKE 1 TABLET BY MOUTH STAT, MAY REPEAT IN 2 HOURS, MAX 200MG PER DAY  . traZODone (DESYREL) 100 MG tablet TAKE 0.5-1 TABLETS (50-100 MG TOTAL) BY MOUTH AT BEDTIME AS NEEDED FOR SLEEP.  Marland Kitchen vitamin B-12 (CYANOCOBALAMIN) 500 MCG tablet Take 1 tablet (500 mcg total) by mouth daily.  Marland Kitchen zolpidem (AMBIEN) 5 MG tablet Take 1 tablet (5 mg total) by mouth at bedtime as needed for sleep.   Current Facility-Administered Medications for the 03/30/18 encounter (Office Visit) with McLean-Scocuzza, Nino Glow, MD  Medication  . bupivacaine (PF) (MARCAINE) 0.25 % injection 30 mL  . bupivacaine (PF) (MARCAINE) 0.25 % injection 30 mL  . fentaNYL (SUBLIMAZE) injection 100 mcg  . fentaNYL (SUBLIMAZE) injection 100 mcg  . lactated ringers infusion 1,000 mL  . lactated ringers infusion 1,000 mL  . lactated ringers infusion 1,000 mL  . lidocaine (PF) (XYLOCAINE) 1 % injection 10 mL  . midazolam (VERSED) 5  MG/5ML injection 5 mg  . orphenadrine (NORFLEX) injection 60 mg  . orphenadrine (NORFLEX) injection 60 mg  . triamcinolone acetonide (KENALOG-40) injection 40 mg  . triamcinolone acetonide (KENALOG-40) injection 40 mg   Allergies  Allergen Reactions  . Penicillins Hives, Shortness Of Breath and Rash    Has patient had a PCN reaction causing immediate rash, facial/tongue/throat swelling, SOB or lightheadedness with hypotension: Yes Has patient had a PCN reaction causing severe rash involving mucus membranes or skin necrosis: No Has patient had a PCN reaction that required hospitalization No Has patient had a PCN reaction occurring within the last 10 years: No If all of the above answers are "NO", then may proceed with Cephalosporin use.    Recent Results (from the past 2160 hour(s))  CBC with Differential/Platelet     Status: Abnormal   Collection Time: 03/04/18 10:10 AM  Result Value Ref Range   WBC 8.1 4.0 - 10.5 K/uL   RBC 3.93 3.87 - 5.11 MIL/uL   Hemoglobin 12.3 12.0 - 15.0 g/dL   HCT 37.4 36.0 - 46.0 %   MCV 95.2 80.0 - 100.0 fL   MCH 31.3 26.0 - 34.0 pg   MCHC 32.9 30.0 - 36.0 g/dL   RDW 12.5 11.5 - 15.5 %   Platelets 204 150 - 400 K/uL   nRBC 0.0 0.0 - 0.2 %   Neutrophils Relative % 44 %   Neutro Abs 3.6 1.7 -  7.7 K/uL   Lymphocytes Relative 43 %   Lymphs Abs 3.5 0.7 - 4.0 K/uL   Monocytes Relative 8 %   Monocytes Absolute 0.6 0.1 - 1.0 K/uL   Eosinophils Relative 3 %   Eosinophils Absolute 0.2 0.0 - 0.5 K/uL   Basophils Relative 1 %   Basophils Absolute 0.1 0.0 - 0.1 K/uL   Immature Granulocytes 1 %   Abs Immature Granulocytes 0.08 (H) 0.00 - 0.07 K/uL    Comment: Performed at [facility], [address] [place], [place] [address]  Basic metabolic panel     Status: Abnormal   Collection Time: 03/04/18 10:10 AM  Result Value Ref Range   Sodium 140 135 - 145 mmol/L   Potassium 3.3 (L) 3.5 - 5.1 mmol/L   Chloride 107 98 - 111 mmol/L   CO2 27 22 - 32  mmol/L   Glucose, Bld 108 (H) 70 - 99 mg/dL   BUN 6 6 - 20 mg/dL   Creatinine, Ser 0.95 0.44 - 1.00 mg/dL   Calcium 8.7 (L) 8.9 - 10.3 mg/dL   GFR calc non Af Amer >60 >60 mL/min   GFR calc Af Amer >60 >60 mL/min   Anion gap 6 5 - 15    Comment: Performed at [facility], [address] [place], [place] [address]  Troponin I - PRN     Status: None   Collection Time: 03/04/18 10:10 AM  Result Value Ref Range   Troponin I <0.03 <0.03 ng/mL    Comment: Performed at [facility], [address] [place], [place] [address]   Objective  Body mass index is 37.14 kg/m. Wt Readings from Last 3 Encounters:  03/30/18 223 lb 3.2 oz (101.2 kg)  03/04/18 220 lb 12 oz (100.1 kg)  11/30/17 224 lb 12.8 oz (102 kg)   Temp Readings from Last 3 Encounters:  03/30/18 98.1 F (36.7 C)  11/03/17 (!) 96.6 F (35.9 C)   BP Readings from Last 3 Encounters:  03/30/18 120/76  03/04/18 128/90  11/30/17 117/75   Pulse Readings from Last 3 Encounters:  03/30/18 76  03/04/18 62  11/30/17 61    Physical Exam Vitals signs and nursing note reviewed.  Constitutional:      Appearance: Normal appearance. She is well-developed. She is obese.  HENT:     Head: Normocephalic and atraumatic.     Nose: Nose normal.     Mouth/Throat:     Mouth: Mucous membranes are moist.     Pharynx: Oropharynx is clear.  Eyes:     Conjunctiva/sclera: Conjunctivae normal.     Pupils: Pupils are equal, round, and reactive to light.  Cardiovascular:     Rate and Rhythm: Normal rate and regular rhythm.     Heart sounds: Normal heart sounds.  Pulmonary:     Effort: Pulmonary effort is normal.     Breath sounds: Normal breath sounds.  Musculoskeletal:     Right hip: She exhibits tenderness.     Left hip: She exhibits tenderness.     Right knee: Tenderness found. Medial joint line and lateral joint line tenderness noted. No MCL, no LCL and no patellar tendon tenderness noted.     Left knee:  Tenderness found. Medial joint line and lateral joint line tenderness noted. No MCL, no LCL and no patellar tendon tenderness noted.     Thoracic back: She exhibits tenderness.     Lumbar back: She exhibits tenderness.     Comments:  Neg str8 leg test b/l   Skin:    General: Skin is warm and dry.  Neurological:     General: No focal deficit present.     Mental Status: She is alert and oriented to person, place, and time.     Gait: Gait normal.  Psychiatric:        Attention and Perception: Attention and perception normal.        Mood and Affect: Mood and affect normal.        Speech: Speech normal.        Behavior: Behavior normal. Behavior is cooperative.        Thought Content: Thought content normal.        Cognition and Memory: Cognition and memory normal.        Judgment: Judgment normal.     Assessment   1. Chronic back pain with radiculopathy and abnormal T and L MRI, b/l hip pain and b/l knee pain  2. hypoK  3. S/p gastric bypass  4. Anxiety/depression  5. Elevated lfts  6. HM Plan   1.  MRI T and L spine with valium 5 mg x 1 before  Xray b/l hips and knees  Prednisone 20 mg qd  Refer to Dr. Daun Peacock F/u pain clinic  2.  rec take Rx K  Given K list  3.  B12 shot today and q6 months 4.  F/u psych  5.  US abdomen ordered  6.  Never gets flu shot  Consider Tdap ? Had 2009/2010 and shingrix in future  rec hep B vaccine consider hep A Consider check MMR in future   Pap pt has not had in a while wants to think about it if needed consider OB/GYN pt wants to wait  Colonoscopy referred  Still not had consider EGD not sch yet, needs colonoscopy as well  Mammogram 09/29/17 normal  DEXA 09/29/17 normal  Will need to check lipid in future  Pending home sleep study    Provider: Dr. Olivia Mackie McLean-Scocuzza-Internal Medicine

## 2018-03-31 MED ORDER — ROPINIROLE HCL 0.25 MG PO TABS: 0.5000 mg | ORAL_TABLET | Freq: Every day | ORAL | 11 refills | Status: DC

## 2018-04-01 ENCOUNTER — Telehealth: Payer: Self-pay | Admitting: *Deleted

## 2018-04-01 NOTE — Telephone Encounter (Signed)
I left a message for the patient to please call back to try to reschedule her testing prior to her follow up appointment 04/15/18.

## 2018-04-01 NOTE — Telephone Encounter (Signed)
-----   Message from Cheyenne Gray, [place] sent at 04/01/2018  1:55 PM EST ----- Pt was a no show for a zio and a myoview, has a follow up appointment on 1/30 with Gollan. Please advise. Does patient need to reschedule these tests or keep the appointment as scheduled.

## 2018-04-05 ENCOUNTER — Ambulatory Visit: Payer: Medicare Other | Admitting: Licensed Clinical Social Worker

## 2018-04-07 NOTE — Telephone Encounter (Signed)
Left voicemail message to call back because we have her scheduled to come in for follow up and she has not had her testing done with instructions to call back to discuss rescheduling.

## 2018-04-08 NOTE — Telephone Encounter (Signed)
Left detailed voicemail message that we would like to reschedule her testing before her upcoming appointment and/or reschedule the upcoming appointment so that we can get the testing done with instructions to call back for any questions and mychart message sent.  After leaving message I do see where her appointment was canceled. Sent mychart message and also called back and left another voicemail with this information.

## 2018-04-14 ENCOUNTER — Ambulatory Visit
Admission: RE | Admit: 2018-04-14 | Discharge: 2018-04-14 | Disposition: A | Discharge status: Home / Self Care | Payer: Medicare Other | Source: Ambulatory Visit | Attending: Internal Medicine | Admitting: Internal Medicine

## 2018-04-14 ENCOUNTER — Other Ambulatory Visit: Payer: Self-pay | Admitting: Internal Medicine

## 2018-04-14 DIAGNOSIS — Z87898 Personal history of other specified conditions: Secondary | ICD-10-CM

## 2018-04-14 DIAGNOSIS — R748 Abnormal levels of other serum enzymes: Secondary | ICD-10-CM | POA: Insufficient documentation

## 2018-04-14 DIAGNOSIS — R1907 Generalized intra-abdominal and pelvic swelling, mass and lump: Secondary | ICD-10-CM

## 2018-04-14 DIAGNOSIS — M5410 Radiculopathy, site unspecified: Secondary | ICD-10-CM

## 2018-04-14 DIAGNOSIS — R1906 Epigastric swelling, mass or lump: Secondary | ICD-10-CM

## 2018-04-14 DIAGNOSIS — K760 Fatty (change of) liver, not elsewhere classified: Secondary | ICD-10-CM

## 2018-04-14 DIAGNOSIS — R937 Abnormal findings on diagnostic imaging of other parts of musculoskeletal system: Secondary | ICD-10-CM

## 2018-04-15 ENCOUNTER — Ambulatory Visit: Payer: Medicare Other | Admitting: Cardiovascular Disease

## 2018-04-16 ENCOUNTER — Telehealth: Payer: Self-pay | Admitting: Internal Medicine

## 2018-04-16 NOTE — Telephone Encounter (Unsigned)
Copied from CRM [id number]. Topic: Quick Communication - Lab Results (Clinic Use ONLY) >> Apr 16, 2018  3:15 PM Jilda Roche wrote: Lab results  Best call back is [phone]

## 2018-04-16 NOTE — Telephone Encounter (Signed)
Left message to call back  

## 2018-04-27 ENCOUNTER — Ambulatory Visit: Admission: RE | Admit: 2018-04-27 | Payer: Medicare Other | Source: Ambulatory Visit

## 2018-04-29 ENCOUNTER — Ambulatory Visit (INDEPENDENT_AMBULATORY_CARE_PROVIDER_SITE_OTHER): Payer: Medicare Other | Admitting: Psychiatry

## 2018-04-29 ENCOUNTER — Encounter: Payer: Self-pay | Admitting: Psychiatry

## 2018-04-29 ENCOUNTER — Encounter: Payer: Self-pay | Admitting: Licensed Clinical Social Worker

## 2018-04-29 ENCOUNTER — Encounter: Payer: Self-pay | Admitting: Internal Medicine

## 2018-04-29 ENCOUNTER — Ambulatory Visit (INDEPENDENT_AMBULATORY_CARE_PROVIDER_SITE_OTHER): Payer: Medicare Other | Admitting: Licensed Clinical Social Worker

## 2018-04-29 ENCOUNTER — Other Ambulatory Visit: Payer: Self-pay

## 2018-04-29 VITALS — BP 114/80 | HR 69 | Temp 97.9°F | Wt 224.4 lb

## 2018-04-29 DIAGNOSIS — F411 Generalized anxiety disorder: Secondary | ICD-10-CM

## 2018-04-29 DIAGNOSIS — F331 Major depressive disorder, recurrent, moderate: Secondary | ICD-10-CM | POA: Diagnosis not present

## 2018-04-29 DIAGNOSIS — F4310 Post-traumatic stress disorder, unspecified: Secondary | ICD-10-CM

## 2018-04-29 DIAGNOSIS — G4700 Insomnia, unspecified: Secondary | ICD-10-CM

## 2018-04-29 DIAGNOSIS — F1120 Opioid dependence, uncomplicated: Secondary | ICD-10-CM

## 2018-04-29 DIAGNOSIS — R41840 Attention and concentration deficit: Secondary | ICD-10-CM

## 2018-04-29 DIAGNOSIS — F1590 Other stimulant use, unspecified, uncomplicated: Secondary | ICD-10-CM

## 2018-04-29 MED ORDER — BUSPIRONE HCL 15 MG PO TABS: 15.0000 mg | ORAL_TABLET | Freq: Two times a day (BID) | ORAL | 0 refills | Status: DC

## 2018-04-29 MED ORDER — ZOLPIDEM TARTRATE 5 MG PO TABS: 5.0000 mg | ORAL_TABLET | Freq: Every evening | ORAL | 2 refills | Status: DC | PRN

## 2018-04-29 NOTE — Progress Notes (Signed)
[place] MD OP Progress Note  04/29/2018 1:01 PM Cheyenne Gray  MRN:  [id number]  Chief Complaint: ' I am here for follow up.' Chief Complaint    Follow-up; Medication Refill     HPI: Cheyenne Gray is a 53 year old Caucasian female, widowed, on disability, has a history of depression, anxiety, multiple medical problems like heart failure, vitamin B12 deficiency, history of prolonged QT, chronic back pain on methadone, vitamin D deficiency, iron deficiency, history of gastric bypass, non-ST elevation MI, hypertension, presented to clinic today for a follow-up visit.  Patient today reports she has been struggling with a lot of health problems.  She reports she recently was told she has epigastric fluid and has to go for CT scan.  She however reports she missed her appointment which was scheduled for 2/11 since she felt she saw another date on her my chart.  Patient reports she is going to talk to them to reschedule soon.  Patient also reports she has back problems and currently undergoing physical therapy.  Patient hence has been very busy taking care of her medical issues.  Patient reports she is taking Cymbalta 60 mg since the 90 mg gave her nausea.  She however reports she has been trying to take it on and off trying to get used to the 90 mg.  Patient reports she is feeling a little bit better with her mood on the current dosage.  Patient reports sleep is improved on the Ambien.  She however does not take it every night since she does not like to sleep too deep.  Patient reports ever since her husband passed away she feels like she is the protector of her house and hence cannot go into deep sleep and does not want to do it.  Patient reports she met someone online who works in [place] now.  She reports she is planning to meet him soon.  She reports she is excited about that.  Patient reports her daughter has seizure disorder and had cluster seizures these past few days.  Patient reports it was a stressor  for her.  Patient reports she has upcoming appointment with [name] today , therapist. Visit Diagnosis:    ICD-10-CM   1. PTSD (post-traumatic stress disorder) F43.10 busPIRone (BUSPAR) 15 MG tablet  2. GAD (generalized anxiety disorder) F41.1 busPIRone (BUSPAR) 15 MG tablet  3. MDD (major depressive disorder), recurrent episode, moderate (HCC) F33.1   4. Insomnia, unspecified type G47.00 zolpidem (AMBIEN) 5 MG tablet  5. 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 Attention and concentration deficit R41.840   7. Opioid dependence in controlled environment [facility]) F11.20     Past Psychiatric History: Reviewed past psychiatric history from my progress note on 11/06/2017.  Past trials of Paxil, Cymbalta, Valium, melatonin, trazodone.  Past Medical History:  Past Medical History:  Diagnosis Date  . (HFpEF) heart failure with preserved ejection fraction ([place])    a. Echo 2014: EF 65-70%, nl WM, mildly dilated LA, PASP nl; b. 12/2014 Echo: EF 65-70%, no rwma, mod septal hypertrophy w/o LVOT gradient or SAM; c. 07/2017 Echo: EF 55-60%, no rwma, mildly dil RV w/ nl syst fxn. Mildly dil RA. Dilated IVC w/ elevated CVP. Triv post effusion.  Marland Kitchen Anxiety   . Arthritis   . Asthma   . B12 deficiency   . Chronic back pain   . Chronic headaches   . Chronic pain    a. on methadone  . Concussion    hx of 4  .  Coronary artery disease, non-occlusive    a. LHC 1/18: proximal to mid LAD 40% stenosed, mid LAD 30% stenosed, mid RCA 20% stenosed, distal RCA 20% stenosed, EF 55-65%, LVEDP normal  . Depression   . DJD (degenerative joint disease), multiple sites   . Frequent headaches   . Gallstone   . H/O non-ST elevation myocardial infarction (NSTEMI)   . Hand, foot and mouth disease 2016  . History of shingles   . Hypertension   . Iron deficiency anemia   . Long QT interval   . Methadone use ([place])    managed by Dr. Primus Bravo  . Migraine   . Obesity   . Palpitations    a. 24 hour Holter: NSR, sinus brady  down to 48, occasional PVCs & couplets, 8 beats NSVT; b. 30 day event monitor 2015: NSR with rare PVC.  Marland Kitchen Psoriasis   . Syncope and collapse   . Vitamin D deficiency     Past Surgical History:  Procedure Laterality Date  . ABDOMINOPLASTY     tummy tuck ? year   . [place] SURGERY  2001  . CARDIAC CATHETERIZATION Left 04/16/2016   Procedure: Left Heart Cath and Coronary Angiography;  Surgeon: [name], MD;  Location: [facility];  Service: Cardiovascular;  Laterality: Left;  . CHOLECYSTECTOMY  2001  . GALLBLADDER SURGERY    . GASTRIC BYPASS  2001  . GASTROPLASTY      Family Psychiatric History: Reviewed family psychiatric history from my progress note on 11/06/2017.  Family History:  Family History  Problem Relation Age of Onset  . Heart attack Mother   . Cancer Mother        pancreatitic   . Early death Mother   . Heart attack Father 55       MI  . Early death Father   . Heart disease Father   . Heart attack Brother   . Heart disease Brother   . Arthritis Brother   . Depression Brother   . Diabetes Brother   . Heart attack Maternal Grandmother   . Cancer Maternal Grandmother        pancreatitic   . Heart disease Maternal Grandmother   . Cancer Maternal Uncle        pancreatitic   . Cancer Paternal Grandmother        ? type   . Diabetes Paternal Grandmother   . Cancer Maternal Uncle        pancreatitic   . Breast cancer Maternal Aunt     Social History: Reviewed social history from my progress note on 11/06/2017. Social History   Socioeconomic History  . Marital status: Widowed    Spouse name: Not on file  . Number of children: 1  . Years of education: assoc degree  . Highest education level: Associate degree: occupational, Hotel manager, or vocational program  Occupational History  . Not on file  Social Needs  . Financial resource strain: Very hard  . Food insecurity:    Worry: Never true    Inability: Never true  . Transportation needs:     Medical: No    Non-medical: No  Tobacco Use  . Smoking status: Never Smoker  . Smokeless tobacco: Never Used  Substance and Sexual Activity  . Alcohol use: No    Alcohol/week: 0.0 standard drinks    Comment: holidays  . Drug use: No  . Sexual activity: Not Currently  Lifestyle  . Physical activity:    Days  per week: 0 days    Minutes per session: 0 min  . Stress: Very much  Relationships  . Social connections:    Talks on phone: Once a week    Gets together: Never    Attends religious service: Never    Active member of club or organization: No    Attends meetings of clubs or organizations: Never    Relationship status: Widowed  Other Topics Concern  . 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 Penicillins Hives, Shortness Of Breath and Rash    Has patient had a PCN reaction causing immediate rash, facial/tongue/throat swelling, SOB or lightheadedness with hypotension: Yes Has patient had a PCN reaction causing severe rash involving mucus membranes or skin necrosis: No Has patient had a PCN reaction that required hospitalization No Has patient had a PCN reaction occurring within the last 10 years: No If all of the above answers are "NO", then may proceed with Cephalosporin use.     Metabolic Disorder Labs: No results found for: HGBA1C, MPG No results found for: PROLACTIN Lab Results  Component Value Date   CHOL 114 10/02/2016   TRIG 59 10/02/2016   HDL 55 10/02/2016   CHOLHDL 2.1 10/02/2016   VLDL 14 01/30/2015   LDLCALC 47 10/02/2016   LDLCALC 94 01/30/2015   Lab Results  Component Value Date   TSH 3.037 07/26/2017    Therapeutic Level Labs: No results found for: LITHIUM No results found for: VALPROATE No components found for:   CBMZ  Current Medications: Current Outpatient Medications  Medication Sig Dispense Refill  . albuterol (PROVENTIL HFA;VENTOLIN HFA) 108 (90 BASE) MCG/ACT inhaler Inhale 2 puffs into the lungs as needed for wheezing.    . busPIRone (BUSPAR) 15 MG tablet Take 1 tablet (15 mg total) by mouth 2 (two) times daily. 180 tablet 0  . Cholecalciferol (VITAMIN D3) 1.25 MG (50000 UT) CAPS TAKE ONE CAPSULE BY MOUTH ONE TIME PER WEEK    . cyclobenzaprine (FLEXERIL) 5 MG tablet     . diazepam (VALIUM) 5 MG tablet Take 1 tablet (5 mg total) by mouth once as needed for up to 1 dose for anxiety. 15 min before MRI 1 tablet 0  . DULoxetine (CYMBALTA) 30 MG capsule Take 1 capsule (30 mg total) by mouth daily. To be combined with 60 mg 90 capsule 0  . DULoxetine (CYMBALTA) 60 MG capsule Take 1 capsule (60 mg total) by mouth daily. 90 capsule 0  . furosemide (LASIX) 40 MG tablet     . gabapentin (NEURONTIN) 400 MG capsule Limit 2 tablets in the a.m. and midday and 3 tablets each evening    Please dispense a three-month supply 630 capsule 0  . isosorbide mononitrate (IMDUR) 60 MG 24 hr tablet Take 1 tablet (60 mg total) by mouth daily. 90 tablet 3  . methadone (DOLOPHINE) 10 MG tablet Limit 1-2 tablets by mouth 2-3 times per day if tolerated 180 tablet 0  . nitroGLYCERIN (NITROSTAT) 0.4 MG SL tablet Place 1 tablet (0.4 mg total) under the tongue every 5 (five) minutes as needed for chest pain. 25 tablet 3  . potassium chloride SA (K-DUR,KLOR-CON) 20  MEQ tablet Take 2 tablets (40 mEq total) by mouth daily. 60 tablet 3  . rOPINIRole (REQUIP) 0.25 MG tablet Take 2-3 tablets (0.5-0.75 mg total) by mouth at bedtime. 90 tablet 11  . rosuvastatin (CRESTOR) 10 MG tablet TAKE 1 TABLET BY MOUTH EVERY DAY 90 tablet 1  . SUMAtriptan (IMITREX) 100 MG tablet TAKE 1 TABLET BY MOUTH AS NEEDED FOR HEADACHE. MAY REPEAT IN 2 HRS IF NEEDED. MAX 200MG/DAY  5  . tiZANidine (ZANAFLEX) 2 MG tablet     . vitamin B-12 (CYANOCOBALAMIN) 500 MCG  tablet Take 1 tablet (500 mcg total) by mouth daily. 30 tablet 6  . vitamin B-12 (CYANOCOBALAMIN) 500 MCG tablet Take by mouth.    . zolpidem (AMBIEN) 5 MG tablet Take 1 tablet (5 mg total) by mouth at bedtime as needed for sleep. 30 tablet 2   Current Facility-Administered Medications  Medication Dose Route Frequency Provider Last Rate Last Dose  . bupivacaine (PF) (MARCAINE) 0.25 % injection 30 mL  30 mL Other Once Mohammed Kindle, MD      . bupivacaine (PF) (MARCAINE) 0.25 % injection 30 mL  30 mL Other Once Mohammed Kindle, MD      . fentaNYL (SUBLIMAZE) injection 100 mcg  100 mcg Intravenous Once Mohammed Kindle, MD      . fentaNYL (SUBLIMAZE) injection 100 mcg  100 mcg Intravenous Once Mohammed Kindle, MD      . lactated ringers infusion 1,000 mL  1,000 mL Intravenous Continuous Mohammed Kindle, MD      . lactated ringers infusion 1,000 mL  1,000 mL Intravenous Continuous Mohammed Kindle, MD      . lactated ringers infusion 1,000 mL  1,000 mL Intravenous Continuous Mohammed Kindle, MD 125 mL/hr at 08/06/15 1002 1,000 mL at 08/06/15 1002  . lidocaine (PF) (XYLOCAINE) 1 % injection 10 mL  10 mL Subcutaneous Once Mohammed Kindle, MD      . midazolam (VERSED) 5 MG/5ML injection 5 mg  5 mg Intravenous Once Mohammed Kindle, MD      . orphenadrine (NORFLEX) injection 60 mg  60 mg Intramuscular Once Mohammed Kindle, MD      . orphenadrine (NORFLEX) injection 60 mg  60 mg Intramuscular Once Mohammed Kindle, MD      . triamcinolone acetonide (KENALOG-40) injection 40 mg  40 mg Other Once Mohammed Kindle, MD      . triamcinolone acetonide (KENALOG-40) injection 40 mg  40 mg Other Once Mohammed Kindle, MD         Musculoskeletal: Strength & Muscle Tone: within normal limits Gait & Station: normal Patient leans: N/A  Psychiatric Specialty Exam: Review of Systems  Psychiatric/Behavioral: The patient is nervous/anxious.   All other systems reviewed and are negative.   Blood pressure 114/80, pulse 69,  temperature 97.9 F (36.6 C), temperature source Oral, weight 224 lb 6.4 oz (101.8 kg), last menstrual period 02/18/2016.Body mass index is 36.22 kg/m.  General Appearance: Casual  Eye Contact:  Fair  Speech:  Normal Rate  Volume:  Normal  Mood:  Anxious  Affect:  Congruent  Thought Process:  Goal Directed and Descriptions of Associations: Intact  Orientation:  Full (Time, Place, and Person)  Thought Content: Logical   Suicidal Thoughts:  No  Homicidal Thoughts:  No  Memory:  Immediate;   Fair Recent;   Fair Remote;   Fair  Judgement:  Fair  Insight:  Fair  Psychomotor Activity:  Normal  Concentration:  Concentration: Fair and Attention Span: Fair  Recall:  [place] of Knowledge: Fair  Language: Fair  Akathisia:  No  Handed:  Right  AIMS (if indicated): denies tremors, rigidity  Assets:  Communication Skills Desire for Improvement Social Support  ADL's:  Intact  Cognition: WNL  Sleep:  improving   Screenings: GAD-7     Office Visit from 09/21/2015 in [employer] [facility]  Total GAD-7 Score  4    Mini-Mental     Office Visit from 10/30/2017 in [facility]  Total Score (max 30 points )  27    PHQ2-9     Office Visit from 08/18/2017 in [facility] Office Visit from 11/15/2015 in [place] Office Visit from 10/18/2015 in [facility] Office Visit from 09/21/2015 in [facility] Office Visit from 07/25/2015 in [facility]  PHQ-2 Total Score  0  0  0  0  0       Assessment and Plan: Mylee is a 53 year old Caucasian female, widowed, has a history of depression, anxiety, multiple medical problems including coronary artery disease, history of N STEMI, long QT per history, gastric bypass, vitamin D, vitamin B12 deficiency, chronic pain on  methadone, presented to clinic today for a follow-up visit.  Patient is biologically predisposed given her history of multiple medical problems, trauma as well as family history of mental health issues.  Patient currently also has psychosocial stressors of current health problems as well as history of being widowed, several deaths in the family, relationship struggles and recent relocation.  Patient however currently reports making progress on the current medication regimen.  She will continue psychotherapy sessions.  Plan PTSD-improving Cymbalta 90 mg p.o. daily. BuSpar 15 mg p.o. twice daily Continue psychotherapy with therapist here in clinic  For MDD-improving Cymbalta as prescribed Continue CBT  For GAD-improving BuSpar and Cymbalta as prescribed  Insomnia-improving She reports Ambien is effective when she takes it.  Continue Ambien 5 mg p.o. nightly as needed  For caffeine use disorder-improving Patient reports she is trying to cut down.  For memory problems-we will continue to monitor closely.  Patient will continue B12 and vitamin D management per PMD.  MMSE completed on 11/06/2017-30 out of 30.  For attention and concentration problems-patient was referred for ADHD testing-pending   Follow up in clinic in 6 weeks or sooner if needed.  I have spent atleast 15 minutes  face to face with patient today. More than 50 % of the time was spent for psychoeducation and supportive psychotherapy and care coordination.  This note was generated in part or whole with voice recognition software. Voice recognition is usually quite accurate but there are transcription errors that can and very often do occur. I apologize for any typographical errors that were not detected and corrected.       Ursula Alert, MD 04/29/2018, 1:01 PM

## 2018-04-29 NOTE — Progress Notes (Signed)
   THERAPIST PROGRESS NOTE  Session Time: 7619-5093  Participation Level: Active  Behavioral Response: NeatAlertAnxious  Type of Therapy: Individual Therapy  Treatment Goals addressed: Coping  Interventions: CBT  Summary: Cheyenne Gray is a 53 y.o. female who presents with continued symptoms of her diagnosis. Milah reports things have been going well since our last session. She states she has a few new medical problems that she has been addressing with her doctors, but she is trying to stay positive. She reports, "I'm just trying to cross the bridges as I get to them instead of getting overwhelmed." We discussed the importance of positivity in those moments, and how she can utilize mindfulness to stay in the moment when she feels herself becoming overwhelmed. Malinda expressed understanding and agreement. Kateria reports meeting a new man online, and reports "falling absolutely in love with him." She reports she has spoken to him daily and they have not yet met in person. LCSW validated Lyn's feelings and her new found happiness. LCSW encouraged Helene to recognize what aspects of the relationship she is getting the most joy from, and attempt to find that satisfaction in other ways as well. Arneisha expressed agreement with this idea. She reports she has attempted to get out of the house more by joining a senior center activities club--she plans to start going to walk in the woods as part of this program.   Suicidal/Homicidal: No  Therapist Response: Triana continues to work towards her tx goals but has not yet reached them. Nadiah was able to speak openly about her symptoms and how she has been able to utilize emotional regulation and CBT skills. We will continue to utilize CBT moving forward to continue assisting [place] in managing her emotions.   Plan: Return again in 2 weeks.  Diagnosis: Axis I: Post Traumatic Stress Disorder    Axis II: No diagnosis    Alden Hipp, LCSW 04/29/2018

## 2018-05-03 ENCOUNTER — Encounter: Payer: Self-pay | Admitting: Internal Medicine

## 2018-05-03 ENCOUNTER — Inpatient Hospital Stay: Payer: Medicare Other | Attending: Oncology

## 2018-05-03 DIAGNOSIS — Z9884 Bariatric surgery status: Secondary | ICD-10-CM | POA: Insufficient documentation

## 2018-05-03 DIAGNOSIS — R5383 Other fatigue: Secondary | ICD-10-CM | POA: Insufficient documentation

## 2018-05-03 DIAGNOSIS — D529 Folate deficiency anemia, unspecified: Secondary | ICD-10-CM | POA: Insufficient documentation

## 2018-05-03 DIAGNOSIS — D508 Other iron deficiency anemias: Secondary | ICD-10-CM | POA: Insufficient documentation

## 2018-05-05 ENCOUNTER — Encounter: Payer: Self-pay | Admitting: Oncology

## 2018-05-05 ENCOUNTER — Other Ambulatory Visit: Payer: Self-pay | Admitting: Internal Medicine

## 2018-05-05 ENCOUNTER — Inpatient Hospital Stay: Payer: Medicare Other

## 2018-05-05 ENCOUNTER — Telehealth: Payer: Self-pay | Admitting: Internal Medicine

## 2018-05-05 ENCOUNTER — Other Ambulatory Visit: Payer: Self-pay

## 2018-05-05 ENCOUNTER — Inpatient Hospital Stay (HOSPITAL_BASED_OUTPATIENT_CLINIC_OR_DEPARTMENT_OTHER): Payer: Medicare Other | Admitting: Oncology

## 2018-05-05 VITALS — BP 132/90 | HR 67 | Temp 95.8°F | Ht 66.0 in | Wt 218.4 lb

## 2018-05-05 DIAGNOSIS — D529 Folate deficiency anemia, unspecified: Secondary | ICD-10-CM | POA: Diagnosis not present

## 2018-05-05 DIAGNOSIS — D508 Other iron deficiency anemias: Secondary | ICD-10-CM

## 2018-05-05 DIAGNOSIS — R5383 Other fatigue: Secondary | ICD-10-CM

## 2018-05-05 DIAGNOSIS — M1990 Unspecified osteoarthritis, unspecified site: Secondary | ICD-10-CM

## 2018-05-05 DIAGNOSIS — Z9884 Bariatric surgery status: Secondary | ICD-10-CM | POA: Diagnosis not present

## 2018-05-05 LAB — CBC WITH DIFFERENTIAL/PLATELET
Abs Immature Granulocytes: 0.01 10*3/uL (ref 0.00–0.07)
Basophils Absolute: 0.1 10*3/uL (ref 0.0–0.1)
Basophils Relative: 1 %
Eosinophils Absolute: 0.2 10*3/uL (ref 0.0–0.5)
Eosinophils Relative: 2 %
HEMATOCRIT: 37.2 % (ref 36.0–46.0)
HEMOGLOBIN: 12.8 g/dL (ref 12.0–15.0)
Immature Granulocytes: 0 %
LYMPHS PCT: 30 %
Lymphs Abs: 2.1 10*3/uL (ref 0.7–4.0)
MCH: 31.4 pg (ref 26.0–34.0)
MCHC: 34.4 g/dL (ref 30.0–36.0)
MCV: 91.2 fL (ref 80.0–100.0)
Monocytes Absolute: 0.6 10*3/uL (ref 0.1–1.0)
Monocytes Relative: 8 %
NEUTROS PCT: 59 %
Neutro Abs: 4.2 10*3/uL (ref 1.7–7.7)
Platelets: 225 10*3/uL (ref 150–400)
RBC: 4.08 MIL/uL (ref 3.87–5.11)
RDW: 12.1 % (ref 11.5–15.5)
WBC: 7.1 10*3/uL (ref 4.0–10.5)
nRBC: 0 % (ref 0.0–0.2)

## 2018-05-05 LAB — FOLATE: Folate: 8 ng/mL (ref >5.9)

## 2018-05-05 LAB — IRON AND TIBC
Iron: 44 ug/dL (ref 28–170)
Saturation Ratios: 15 % (ref 10.4–31.8)
TIBC: 304 ug/dL (ref 250–450)
UIBC: 260 ug/dL

## 2018-05-05 LAB — VITAMIN B12: Vitamin B-12: 988 pg/mL — ABNORMAL HIGH (ref 180–914)

## 2018-05-05 LAB — FERRITIN: FERRITIN: 99 ng/mL (ref 11–307)

## 2018-05-05 MED ORDER — DICLOFENAC SODIUM 1 % TD GEL: 4.0000 g | Freq: Four times a day (QID) | TRANSDERMAL | 11 refills | Status: DC

## 2018-05-05 NOTE — Progress Notes (Signed)
Patient here today for follow up and possible iron infusion.  Patient states that she has been fatigued for the past 2 weeks.

## 2018-05-05 NOTE — Telephone Encounter (Signed)
Please check on ortho referral for OA knees and hand pain Pt asking about it referral was placed 09/2017  Call pt   Thanks TMS

## 2018-05-06 ENCOUNTER — Ambulatory Visit: Admission: RE | Admit: 2018-05-06 | Payer: Medicare Other | Source: Ambulatory Visit

## 2018-05-06 NOTE — Telephone Encounter (Signed)
Resubmitted to [facility] through [facility]

## 2018-05-06 NOTE — Progress Notes (Signed)
Hematology/Oncology  Follow up note [facility] Telephone:([phone] Fax:([phone]) [phone]   Patient Care Team: McLean-Scocuzza, Nino Glow, MD as PCP - General (Internal Medicine) [name], MD as PCP - Cardiology (Cardiology) Donnie Coffin, MD as Referring Physician (Family Medicine) [name], MD as Consulting Physician (Cardiology)  REASON FOR VISIT Follow up for treatment of anemia.  HISTORY OF PRESENTING ILLNESS:  This is a patient who used to follow up with Dr. Ma Hillock presents for follow-up of management of her anemia.   History of gastric bypass in 2001 Patient was last seen by Dr. Ma Hillock in July 2016 for iron deficiency.  She was again being seen by nurse practitioner on August 21, 2015 for follow-up of her iron deficiency anemia.  She has a history of iron deficiency anemia remote gastric bypass history in 2001.  She used to IV iron infusion with venofer 14m Every 3 months but she has a [place].  Patient reports feeling fatigued.  She did not have much of appetite.  Denies any nausea vomiting diarrhea or abdominal pain.  She has no unintentional weight loss, instead she feels that she has gained a few pounds.  # . She follows up with cardiology for CAD, long QT syndrome, chest pain, lower extremity edema.  INTERVAL HISTORY Cheyenne MKOURTNEI RAUBERis a 53y.o. female who has above history reviewed by me today presents for follow up visit for management of chronic anemia iron deficiency.  She has received IV Venofer in the past.  Reports the she feels fatigued for the past 2 weeks.  Denies any weight loss, fever, chills or night sweating. She did not get labs done prior to the visit.  Today's labs are still pending when she was in the clinic.    Review of Systems  Constitutional: Positive for malaise/fatigue. Negative for chills, fever and weight loss.  HENT: Negative for hearing loss and nosebleeds.   Eyes: Negative for  photophobia and pain.  Respiratory: Negative for cough and sputum production.   Cardiovascular: Negative for chest pain and palpitations.  Gastrointestinal: Negative for abdominal pain, heartburn, nausea and vomiting.  Genitourinary: Negative for dysuria.  Musculoskeletal: Negative for myalgias.  Skin: Negative for rash.  Neurological: Negative for dizziness.  Endo/Heme/Allergies: Does not bruise/bleed easily.  Psychiatric/Behavioral: Negative for depression.    MEDICAL HISTORY:  Past Medical History:  Diagnosis Date  . (HFpEF) heart failure with preserved ejection fraction ([place]    a. Echo 2014: EF 65-70%, nl WM, mildly dilated LA, PASP nl; b. 12/2014 Echo: EF 65-70%, no rwma, mod septal hypertrophy w/o LVOT gradient or SAM; c. 07/2017 Echo: EF 55-60%, no rwma, mildly dil RV w/ nl syst fxn. Mildly dil RA. Dilated IVC w/ elevated CVP. Triv post effusion.  .Marland KitchenAnxiety   . Arthritis   . Asthma   . B12 deficiency   . Chronic back pain   . Chronic headaches   . Chronic pain    a. on methadone  . Concussion    hx of 4  . Coronary artery disease, non-occlusive    a. LHC 1/18: proximal to mid LAD 40% stenosed, mid LAD 30% stenosed, mid RCA 20% stenosed, distal RCA 20% stenosed, EF 55-65%, LVEDP normal  . Depression   . DJD (degenerative joint disease), multiple sites   . Frequent headaches   . Gallstone   . H/O non-ST elevation myocardial infarction (NSTEMI)   . Hand, foot and mouth disease 2016  .  History of shingles   . Hypertension   . Iron deficiency anemia   . Long QT interval   . Methadone use ([place])    managed by Dr. Primus Bravo  . Migraine   . Obesity   . Palpitations    a. 24 hour Holter: NSR, sinus brady down to 48, occasional PVCs & couplets, 8 beats NSVT; b. 30 day event monitor 2015: NSR with rare PVC.  Marland Kitchen Psoriasis   . Syncope and collapse   . Vitamin D deficiency     SURGICAL HISTORY: Past Surgical History:  Procedure Laterality Date  . ABDOMINOPLASTY     tummy tuck  ? year   . [place] SURGERY  2001  . CARDIAC CATHETERIZATION Left 04/16/2016   Procedure: Left Heart Cath and Coronary Angiography;  Surgeon: [name], MD;  Location: [facility];  Service: Cardiovascular;  Laterality: Left;  . CHOLECYSTECTOMY  2001  . GALLBLADDER SURGERY    . GASTRIC BYPASS  2001  . GASTROPLASTY      SOCIAL HISTORY: Social History   Socioeconomic History  . Marital status: Widowed    Spouse name: Not on file  . Number of children: 1  . Years of education: assoc degree  . Highest education level: Associate degree: occupational, Hotel manager, or vocational program  Occupational History  . Not on file  Social Needs  . Financial resource strain: Very hard  . Food insecurity:    Worry: Never true    Inability: Never true  . Transportation needs:    Medical: No    Non-medical: No  Tobacco Use  . Smoking status: Never Smoker  . Smokeless tobacco: Never Used  Substance and Sexual Activity  . Alcohol use: No    Alcohol/week: 0.0 standard drinks    Comment: holidays  . Drug use: No  . Sexual activity: Not Currently  Lifestyle  . Physical activity:    Days per week: 0 days    Minutes per session: 0 min  . Stress: Very much  Relationships  . Social connections:    Talks on phone: Once a week    Gets together: Never    Attends religious service: Never    Active member of club or organization: No    Attends meetings of clubs or organizations: Never    Relationship status: Widowed  . Intimate partner violence:    Fear of current or ex partner: No    Emotionally abused: No    Physically abused: No    Forced sexual activity: No  Other Topics Concern  . 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 Heart attack Mother   . Cancer Mother        pancreatitic   . Early death Mother   . Heart attack Father 53       MI  . Early death Father   . Heart disease Father   . Heart attack Brother   . Heart disease Brother   . Arthritis Brother   . Depression Brother   . Diabetes Brother   . Heart attack Maternal Grandmother   . Cancer Maternal Grandmother  pancreatitic   . Heart disease Maternal Grandmother   . Cancer Maternal Uncle        pancreatitic   . Cancer Paternal Grandmother        ? type   . Diabetes Paternal Grandmother   . Cancer Maternal Uncle        pancreatitic   . Breast cancer Maternal Aunt     ALLERGIES:  is allergic to penicillins.  MEDICATIONS:  Current Outpatient Medications  Medication Sig Dispense Refill  . albuterol (PROVENTIL HFA;VENTOLIN HFA) 108 (90 BASE) MCG/ACT inhaler Inhale 2 puffs into the lungs as needed for wheezing.    . busPIRone (BUSPAR) 15 MG tablet Take 1 tablet (15 mg total) by mouth 2 (two) times daily. 180 tablet 0  . Cholecalciferol (VITAMIN D3) 1.25 MG (50000 UT) CAPS TAKE ONE CAPSULE BY MOUTH ONE TIME PER WEEK    . DULoxetine (CYMBALTA) 60 MG capsule Take 1 capsule (60 mg total) by mouth daily. 90 capsule 0  . furosemide (LASIX) 40 MG tablet     . gabapentin (NEURONTIN) 400 MG capsule Limit 2 tablets in the a.m. and midday and 3 tablets each evening    Please dispense a three-month supply 630 capsule 0  . isosorbide mononitrate (IMDUR) 60 MG 24 hr tablet Take 1 tablet (60 mg total) by mouth daily. 90 tablet 3  . methadone (DOLOPHINE) 10 MG tablet Limit 1-2 tablets by mouth 2-3 times per day if tolerated 180 tablet 0  . nitroGLYCERIN (NITROSTAT) 0.4 MG SL tablet Place 1 tablet (0.4 mg total) under the tongue every 5 (five) minutes as needed for chest pain. 25 tablet 3  . rOPINIRole (REQUIP) 0.25 MG tablet Take 2-3 tablets (0.5-0.75 mg total) by mouth at bedtime. 90 tablet 11  . rosuvastatin (CRESTOR) 10 MG tablet TAKE 1 TABLET  BY MOUTH EVERY DAY 90 tablet 1  . SUMAtriptan (IMITREX) 100 MG tablet TAKE 1 TABLET BY MOUTH AS NEEDED FOR HEADACHE. MAY REPEAT IN 2 HRS IF NEEDED. MAX 200MG/DAY  5  . tiZANidine (ZANAFLEX) 2 MG tablet     . vitamin B-12 (CYANOCOBALAMIN) 500 MCG tablet Take 1 tablet (500 mcg total) by mouth daily. 30 tablet 6  . zolpidem (AMBIEN) 5 MG tablet Take 1 tablet (5 mg total) by mouth at bedtime as needed for sleep. 30 tablet 2  . diclofenac sodium (VOLTAREN) 1 % GEL Apply 4 g topically 4 (four) times daily. Prn knees and 2 grams qid prn hands arthritis 100 g 11   Current Facility-Administered Medications  Medication Dose Route Frequency Provider Last Rate Last Dose  . bupivacaine (PF) (MARCAINE) 0.25 % injection 30 mL  30 mL Other Once Mohammed Kindle, MD      . bupivacaine (PF) (MARCAINE) 0.25 % injection 30 mL  30 mL Other Once Mohammed Kindle, MD      . fentaNYL (SUBLIMAZE) injection 100 mcg  100 mcg Intravenous Once Mohammed Kindle, MD      . fentaNYL (SUBLIMAZE) injection 100 mcg  100 mcg Intravenous Once Mohammed Kindle, MD      . lactated ringers infusion 1,000 mL  1,000 mL Intravenous Continuous Mohammed Kindle, MD      . lactated ringers infusion 1,000 mL  1,000 mL Intravenous Continuous Mohammed Kindle, MD      . lactated ringers infusion 1,000 mL  1,000 mL Intravenous Continuous Mohammed Kindle, MD 125 mL/hr at 08/06/15 1002 1,000 mL at 08/06/15 1002  . lidocaine (PF) (XYLOCAINE) 1 % injection 10 mL  10 mL Subcutaneous Once Mohammed Kindle, MD      . midazolam (VERSED) 5 MG/5ML injection 5 mg  5 mg Intravenous Once Mohammed Kindle, MD      . orphenadrine (NORFLEX) injection 60 mg  60 mg Intramuscular Once Mohammed Kindle, MD      . orphenadrine (NORFLEX) injection 60 mg  60 mg Intramuscular Once Mohammed Kindle, MD      . triamcinolone acetonide (KENALOG-40) injection 40 mg  40 mg Other Once Mohammed Kindle, MD      . triamcinolone acetonide (KENALOG-40) injection 40 mg  40 mg Other Once Mohammed Kindle,  MD         PHYSICAL EXAMINATION: ECOG PERFORMANCE STATUS: 1 - Symptomatic but completely ambulatory Vitals:   05/05/18 1316  BP: 132/90  Pulse: 67  Temp: (!) 95.8 F (35.4 C)   Filed Weights   05/05/18 1316  Weight: 218 lb 6 oz (99.1 kg)    Physical Exam  Constitutional: She is oriented to person, place, and time and well-developed, well-nourished, and in no distress. No distress.  Obese  HENT:  Head: Normocephalic and atraumatic.  Nose: Nose normal.  Mouth/Throat: Oropharynx is clear and moist. No oropharyngeal exudate.  Eyes: Pupils are equal, round, and reactive to light. Conjunctivae and EOM are normal. Left eye exhibits no discharge. No scleral icterus.  Neck: Normal range of motion. Neck supple. No JVD present.  Cardiovascular: Normal rate, regular rhythm and normal heart sounds.  No murmur heard. Pulmonary/Chest: Effort normal and breath sounds normal. No respiratory distress. She has no wheezes. She has no rales. She exhibits no tenderness.  Abdominal: Soft. Bowel sounds are normal. She exhibits no distension and no mass. There is no abdominal tenderness. There is no rebound.  Musculoskeletal: Normal range of motion.        General: No tenderness, deformity or edema.  Lymphadenopathy:    She has no cervical adenopathy.  Neurological: She is alert and oriented to person, place, and time. No cranial nerve deficit. She exhibits normal muscle tone. Coordination normal.  Skin: Skin is warm and dry. No rash noted. She is not diaphoretic. No erythema.  Psychiatric: Affect and judgment normal.     LABORATORY DATA:  I have reviewed the data as listed Lab Results  Component Value Date   WBC 7.1 05/05/2018   HGB 12.8 05/05/2018   HCT 37.2 05/05/2018   MCV 91.2 05/05/2018   PLT 225 05/05/2018   Recent Labs    08/18/17 0954 11/02/17 1029 03/04/18 1010  NA 146 139 140  K 3.6 3.4* 3.3*  CL 106 100 107  CO2 26 28 27   GLUCOSE 108* 125* 108*  BUN 5* 7 6  CREATININE  0.86 0.86 0.95  CALCIUM 9.2 9.0 8.7*  GFRNONAA 79 >60 >60  GFRAA 92 >60 >60  PROT 6.7 7.1  --   ALBUMIN 3.7 3.7  --   AST 20 50*  --   ALT 11 32  --   ALKPHOS 99 118  --   BILITOT 0.4 0.7  --   BILIDIR 0.1  --   --        ASSESSMENT & PLAN:  1. Other iron deficiency anemia   2. Anemia due to folic acid deficiency, unspecified deficiency type   3. Hx of gastric bypass    #Labs reviewed.  CBC showed normal hemoglobin. Hold IV iron for now. Discussed with patient that I will wait for her iron panel and other labs to come back.  If she needs IV iron, we will call her and arrange IV iron infusions. Recommend patient to continue take folic acid 1 mg daily and she get vitamin B12 injections at PCP's office.   Labs came back, reviewed.  Normal hemoglobin, ferritin is decreased from last visit, 99 iron saturation 15, slightly decreased from last visit. Given her history of gastric bypass, lack of oral iron absorption insufficiency, recommend patient to proceed with 1 dose of IV Venofer. B12 level came back elevated 998.  #Fatigue, discussed with patient that fatigue may not be related to her anemia.  She has normal hemoglobin.  Fatigue can be nonspecific and be secondary to many other conditions.  Advised patient to continue follow-up with primary care physician for further evaluation. TSH was previously checked on 07/26/2017 and was normal.   # epigastric fluid collection, she has CT scan scheduled for further evaluation.   All questions were answered. The patient knows to call the clinic with any problems questions or concerns.  Return of visit: 6 months.  repeat labs prior to visit, and possible Venofer.  Orders Placed This Encounter  Procedures  . CBC with Differential/Platelet    Standing Status:   Future    Standing Expiration Date:   05/07/2019  . Iron and TIBC    Standing Status:   Future    Standing Expiration Date:   05/07/2019  . Ferritin    Standing Status:   Future     Standing Expiration Date:   05/07/2019  . Vitamin B12    Standing Status:   Future    Standing Expiration Date:   05/07/2019    Earlie Server, MD, PhD Hematology Oncology [facility] Pager- [phone] 05/06/2018

## 2018-05-07 ENCOUNTER — Telehealth: Payer: Self-pay | Admitting: *Deleted

## 2018-05-07 NOTE — Telephone Encounter (Signed)
One dose of Venofer next week per Dr. Cathie Hoops. Appt was scheduled as requested. I called patient and left a detailed message  on her vmail making her aware of the date and time of her scheduled appt.

## 2018-05-12 ENCOUNTER — Inpatient Hospital Stay: Payer: Medicare Other

## 2018-05-17 ENCOUNTER — Encounter: Payer: Self-pay | Admitting: Internal Medicine

## 2018-05-17 ENCOUNTER — Ambulatory Visit (INDEPENDENT_AMBULATORY_CARE_PROVIDER_SITE_OTHER): Payer: Medicare Other | Admitting: Licensed Clinical Social Worker

## 2018-05-17 ENCOUNTER — Ambulatory Visit
Admission: RE | Admit: 2018-05-17 | Discharge: 2018-05-17 | Disposition: A | Discharge status: Home / Self Care | Payer: Medicare Other | Source: Ambulatory Visit | Attending: Internal Medicine | Admitting: Internal Medicine

## 2018-05-17 ENCOUNTER — Encounter: Payer: Self-pay | Admitting: Licensed Clinical Social Worker

## 2018-05-17 DIAGNOSIS — F411 Generalized anxiety disorder: Secondary | ICD-10-CM

## 2018-05-17 DIAGNOSIS — F4310 Post-traumatic stress disorder, unspecified: Secondary | ICD-10-CM

## 2018-05-17 DIAGNOSIS — R1906 Epigastric swelling, mass or lump: Secondary | ICD-10-CM | POA: Insufficient documentation

## 2018-05-17 DIAGNOSIS — K760 Fatty (change of) liver, not elsewhere classified: Secondary | ICD-10-CM | POA: Insufficient documentation

## 2018-05-17 DIAGNOSIS — R1907 Generalized intra-abdominal and pelvic swelling, mass and lump: Secondary | ICD-10-CM | POA: Insufficient documentation

## 2018-05-17 DIAGNOSIS — Z87898 Personal history of other specified conditions: Secondary | ICD-10-CM | POA: Insufficient documentation

## 2018-05-17 MED ORDER — IOHEXOL 300 MG/ML  SOLN
100.0000 mL | Freq: Once | INTRAMUSCULAR | Status: AC | PRN
  Administered 2018-05-17: 100 mL via INTRAVENOUS

## 2018-05-17 NOTE — Progress Notes (Signed)
   THERAPIST PROGRESS NOTE  Session Time: 6045-4098  Participation Level: Active  Behavioral Response: NeatAlertIrritable  Type of Therapy: Individual Therapy  Treatment Goals addressed: Coping  Interventions: Supportive  Summary: Cheyenne Gray is a 53 y.o. female who presents with continued symptoms of her diagnosis. [name] reports doing well since our last session but added she is feeling angry today. She stated, "I've just had enough of everyone." [name] went on to state her ex-boyfriend, Mac, was helping her with something at her house and sent himself two photos of her daughter off her phone. She reported feeling angry about that and that she confronted him. LCSW encouraged [name] to examine how continuing to allow him in her life looks and feels to her daughter. Additionally, LCSW encouraged [name] to recognize the pattern of behavior he has presented. [name] expressed understanding and agreement with this information. [name] reported feeling conflicted about her father passing away recently. She reported having a complicated relationship with him, and feeling complex emotions around his death. LCSW validated those feelings and highlighted the normalcy around feeling a variety of emotions around the death of a family member. [name] expressed understanding and agreement with this idea as well. [name] stated continued frustration within her friendship with [name]--"she's constantly cross boundaries and doing stuff that's just not okay with me. She expects me to drive her places." LCSW validated [name]'s feelings and encouraged her to set more firm boundaries with Jenny Reichmann and utilize more assertive communication to do so. [name] and LCSW discussed ways to do that and [name] expressed understanding.   Suicidal/Homicidal: No Therapist Response: Lilas continues to work towards her tx goals but has not yet reached them. She is able to regulate her emotions more effectively than she was previously, but still  needs to practice skills to improve emotional regulation moving forward. We will continue to work on managing feelings in the moment utilizing CBT.    Plan: Return again in 2 weeks.  Diagnosis: Axis I: Generalized Anxiety Disorder    Axis II: No diagnosis    Alden Hipp, LCSW 05/17/2018

## 2018-05-18 ENCOUNTER — Inpatient Hospital Stay: Payer: Medicare Other | Attending: Oncology

## 2018-05-18 VITALS — BP 124/71 | HR 60 | Temp 96.0°F | Resp 18

## 2018-05-18 DIAGNOSIS — D508 Other iron deficiency anemias: Secondary | ICD-10-CM | POA: Insufficient documentation

## 2018-05-18 MED ORDER — IRON SUCROSE 20 MG/ML IV SOLN
200.0000 mg | Freq: Once | INTRAVENOUS | Status: AC
  Administered 2018-05-18: 200 mg via INTRAVENOUS
  Filled 2018-05-18: qty 10

## 2018-05-18 MED ORDER — SODIUM CHLORIDE 0.9 % IV SOLN
Freq: Once | INTRAVENOUS | Status: AC
  Administered 2018-05-18: 12:00:00 via INTRAVENOUS
  Filled 2018-05-18: qty 250

## 2018-05-19 ENCOUNTER — Encounter: Payer: Self-pay | Admitting: Internal Medicine

## 2018-05-20 ENCOUNTER — Telehealth: Payer: Self-pay | Admitting: Cardiovascular Disease

## 2018-05-20 NOTE — Telephone Encounter (Signed)
Patient calling  States that she received results of an abnormal CT and would like to discuss with nurse - patient scheduled to see Dr Mariah Milling on 3/20 Please call to discuss

## 2018-05-20 NOTE — Telephone Encounter (Signed)
Returned call to patient. She had abd CT ordered from PCP for c/o abd pain. Incidental finding of R CAD noted.   Pt wanting more information r/t results from a cardiac perspective.   I let patient know that further testing is likely needed to assess. At last Elliot Gault, NP ordered [name] scan and Zio which pt declined due to "symptoms were caused by stress and it is better now".    I suggested that since patient has active order and will be seeing Dr Mariah Milling on  06/04/2018 it may be helpful to plan of care to go ahead and complete this prior to appt.   Pt agreable and order still active.  I will forward message to scheduling for lexi prior to next OV.   Forwarded to provider if further advice is warranted.

## 2018-05-21 NOTE — Telephone Encounter (Signed)
LMOV to schedule  

## 2018-05-24 ENCOUNTER — Ambulatory Visit (INDEPENDENT_AMBULATORY_CARE_PROVIDER_SITE_OTHER): Payer: Medicare Other | Admitting: Licensed Clinical Social Worker

## 2018-05-24 ENCOUNTER — Encounter: Payer: Self-pay | Admitting: Licensed Clinical Social Worker

## 2018-05-24 DIAGNOSIS — F4310 Post-traumatic stress disorder, unspecified: Secondary | ICD-10-CM | POA: Diagnosis not present

## 2018-05-24 NOTE — Progress Notes (Signed)
   THERAPIST PROGRESS NOTE  Session Time: 3435-6861  Participation Level: Active  Behavioral Response: Well GroomedAlertNA  Type of Therapy: Individual Therapy  Treatment Goals addressed: Anxiety  Interventions: Supportive  Summary: Cheyenne Gray is a 53 y.o. female who presents with continued symptoms of her diagnosis. [name] reports doing well since our last session. She reports her daughter is in town which has been good and has improved her mood. She reports continued problems within her friendship with her ex, Mac, who is "obsessed," with her daughter. After today's session, [name] were going to obtain a restraining order, as they've found out he is essentially stalking [name]. LCSW validated [name]'s support of her daughter through this interaction, and encouraged her to be vocal about her support to her daughter. [name] expressed understanding and agreement. [name] reports she recently started setting more appropriate boundaries with her friend, Jenny Reichmann, after Jenny Reichmann has continuously crossed boundaries. [name] reported, "she came over to the house and she drank wine without asking, she ate all this food without asking, and it was just enough already--so I told her she needed to cut it out." LCSW validated [name]'s expression and use of assertive communication in that instance--and encouraged [name] to utilize assertive communication in other areas of her life as well. [name] expressed understanding and agreement. [name] reported having some anxiety around medical problems she's had recently. She reported she found out the left side of her heart is enlarged, and added she is going on for a colonoscopy to determine if a mass on her colon is cancer or not. She reports feeling positive about the process, and stated, "I'm sure it'll be nothing." LCSW highlighted [name]'s ability to maintain positivity throughout the process and encouraged her to continue the same. [name] expressed  understanding and agreement with this idea.   Suicidal/Homicidal: No  Therapist Response: [name] continues to work towards her tx goals but has not yet reached them. She has improved her ability to manage her affect and regulate her emotions in the moment, and continues to work towards improving those skills further. We will continue to utilize CBT moving forward to assist in further managing mental health symptoms.   Plan: Return again in 2 weeks.  Diagnosis: Axis I: Post Traumatic Stress Disorder    Axis II: No diagnosis    Alden Hipp, LCSW 05/24/2018

## 2018-05-26 ENCOUNTER — Encounter
Admission: RE | Admit: 2018-05-26 | Discharge: 2018-05-26 | Disposition: A | Discharge status: Home / Self Care | Payer: Medicare Other | Source: Ambulatory Visit | Attending: Nurse Practitioner | Admitting: Nurse Practitioner

## 2018-05-26 DIAGNOSIS — R079 Chest pain, unspecified: Secondary | ICD-10-CM | POA: Insufficient documentation

## 2018-05-31 ENCOUNTER — Ambulatory Visit (INDEPENDENT_AMBULATORY_CARE_PROVIDER_SITE_OTHER): Payer: Medicare Other | Admitting: Gastroenterology

## 2018-05-31 ENCOUNTER — Other Ambulatory Visit: Payer: Self-pay

## 2018-05-31 ENCOUNTER — Encounter: Payer: Self-pay | Admitting: Gastroenterology

## 2018-05-31 VITALS — BP 113/77 | HR 63 | Ht 66.0 in | Wt 224.4 lb

## 2018-05-31 DIAGNOSIS — K639 Disease of intestine, unspecified: Secondary | ICD-10-CM

## 2018-05-31 DIAGNOSIS — Z1211 Encounter for screening for malignant neoplasm of colon: Secondary | ICD-10-CM | POA: Diagnosis not present

## 2018-05-31 DIAGNOSIS — D508 Other iron deficiency anemias: Secondary | ICD-10-CM

## 2018-05-31 DIAGNOSIS — R748 Abnormal levels of other serum enzymes: Secondary | ICD-10-CM

## 2018-05-31 NOTE — Progress Notes (Signed)
Cheyenne Bouillon, MD [address]  [address]  [place], [place] [address]  Main: [phone]  Fax: [phone]   Primary Care Physician: McLean-Scocuzza, Pasty Spillers, MD   Chief Complaint  Patient presents with  . Follow-up    elevated liver enzymes, IDA, dysphagia    HPI: Cheyenne Gray is a 53 y.o. female previously referred for elevated liver enzymes, and iron deficiency anemia, with follow-up blood work and endoscopy scheduled, but patient repeatedly fails to follow-up with the above appointments. The patient denies abdominal or flank pain, anorexia, nausea or vomiting, dysphagia, change in bowel habits or black or bloody stools or weight loss.  Patient seeing hematology for iron deficiency anemia and has been receiving IV iron transfusion.  She has a history of gastric bypass and her iron deficiency anemia is likely due to that.  Previous history: Reports an ER visit 2018 due to food impaction. ER notes reviewed, did reveal the patient was given glucagon, and felt better, and was discharged from the ER. She had an EGD in 2015 due to melena, anemia and abdominal pain. This was done by Dr. Dow Adolph.ulceration was noted at the gastro jejunal anastomosis. Patient was given sucralfate. Stomach biopsies did not reveal H. Pylori.  Reports history of colonoscopy prior to her gastric bypass 15 or 16 years ago as well. No recent colonoscopies. No immediate family history of colon cancer.  Reports alternating constipation and diarrhea. States has some days with hard balls of stool, and others with loose stools.  History of gastric bypass surgery 15-16 years ago for weight loss  Denies any alcohol use or history of cirrhosis Mildly elevated AST noted on previous labs  Current Outpatient Medications  Medication Sig Dispense Refill  . albuterol (PROVENTIL HFA;VENTOLIN HFA) 108 (90 BASE) MCG/ACT inhaler Inhale 2 puffs into the lungs as needed for wheezing.    .  busPIRone (BUSPAR) 15 MG tablet Take 1 tablet (15 mg total) by mouth 2 (two) times daily. 180 tablet 0  . Cholecalciferol (VITAMIN D3) 1.25 MG (50000 UT) CAPS TAKE ONE CAPSULE BY MOUTH ONE TIME PER WEEK    . diclofenac sodium (VOLTAREN) 1 % GEL Apply 4 g topically 4 (four) times daily. Prn knees and 2 grams qid prn hands arthritis 100 g 11  . DULoxetine (CYMBALTA) 60 MG capsule Take 1 capsule (60 mg total) by mouth daily. 90 capsule 0  . furosemide (LASIX) 40 MG tablet     . gabapentin (NEURONTIN) 400 MG capsule Limit 2 tablets in the a.m. and midday and 3 tablets each evening    Please dispense a three-month supply 630 capsule 0  . isosorbide mononitrate (IMDUR) 60 MG 24 hr tablet Take 1 tablet (60 mg total) by mouth daily. 90 tablet 3  . methadone (DOLOPHINE) 10 MG tablet Limit 1-2 tablets by mouth 2-3 times per day if tolerated 180 tablet 0  . nitroGLYCERIN (NITROSTAT) 0.4 MG SL tablet Place 1 tablet (0.4 mg total) under the tongue every 5 (five) minutes as needed for chest pain. 25 tablet 3  . rOPINIRole (REQUIP) 0.25 MG tablet Take 2-3 tablets (0.5-0.75 mg total) by mouth at bedtime. 90 tablet 11  . rosuvastatin (CRESTOR) 10 MG tablet TAKE 1 TABLET BY MOUTH EVERY DAY 90 tablet 1  . SUMAtriptan (IMITREX) 100 MG tablet TAKE 1 TABLET BY MOUTH AS NEEDED FOR HEADACHE. MAY REPEAT IN 2 HRS IF NEEDED. MAX 200MG /DAY  5  . tiZANidine (ZANAFLEX) 2 MG tablet     .  zolpidem (AMBIEN) 5 MG tablet Take 1 tablet (5 mg total) by mouth at bedtime as needed for sleep. 30 tablet 2   Current Facility-Administered Medications  Medication Dose Route Frequency Provider Last Rate Last Dose  . bupivacaine (PF) (MARCAINE) 0.25 % injection 30 mL  30 mL Other Once Ewing Schlein, MD      . bupivacaine (PF) (MARCAINE) 0.25 % injection 30 mL  30 mL Other Once Ewing Schlein, MD      . fentaNYL (SUBLIMAZE) injection 100 mcg  100 mcg Intravenous Once Ewing Schlein, MD      . fentaNYL (SUBLIMAZE) injection 100 mcg  100 mcg  Intravenous Once Ewing Schlein, MD      . lactated ringers infusion 1,000 mL  1,000 mL Intravenous Continuous Ewing Schlein, MD      . lactated ringers infusion 1,000 mL  1,000 mL Intravenous Continuous Ewing Schlein, MD      . lactated ringers infusion 1,000 mL  1,000 mL Intravenous Continuous Ewing Schlein, MD 125 mL/hr at 08/06/15 1002 1,000 mL at 08/06/15 1002  . lidocaine (PF) (XYLOCAINE) 1 % injection 10 mL  10 mL Subcutaneous Once Ewing Schlein, MD      . midazolam (VERSED) 5 MG/5ML injection 5 mg  5 mg Intravenous Once Ewing Schlein, MD      . orphenadrine (NORFLEX) injection 60 mg  60 mg Intramuscular Once Ewing Schlein, MD      . orphenadrine (NORFLEX) injection 60 mg  60 mg Intramuscular Once Ewing Schlein, MD      . triamcinolone acetonide (KENALOG-40) injection 40 mg  40 mg Other Once Ewing Schlein, MD      . triamcinolone acetonide [facility]) injection 40 mg  40 mg Other Once Ewing Schlein, MD        Allergies as of 05/31/2018 - Review Complete 05/31/2018  Allergen Reaction Noted  . Penicillins Hives, Shortness Of Breath, and Rash 07/20/2009    ROS:  General: Negative for anorexia, weight loss, fever, chills, fatigue, weakness. ENT: Negative for hoarseness, difficulty swallowing , nasal congestion. CV: Negative for chest pain, angina, palpitations, dyspnea on exertion, peripheral edema.  Respiratory: Negative for dyspnea at rest, dyspnea on exertion, cough, sputum, wheezing.  GI: See history of present illness. GU:  Negative for dysuria, hematuria, urinary incontinence, urinary frequency, nocturnal urination.  Endo: Negative for unusual weight change.    Physical Examination:   BP 113/77   Pulse 63   Ht  (1.676 m)   Wt 224 lb 6.4 oz (101.8 kg)   LMP 02/18/2016 (Approximate) Comment: neg HCG-03/26/2016  BMI 36.22 kg/m   General: Well-nourished, well-developed in no acute distress.  Eyes: No icterus. Conjunctivae pink. Mouth: Oropharyngeal mucosa  moist and pink , no lesions erythema or exudate. Neck: Supple, Trachea midline Abdomen: Bowel sounds are normal, nontender, nondistended, no hepatosplenomegaly or masses, no abdominal bruits or hernia , no rebound or guarding.   Extremities: No lower extremity edema. No clubbing or deformities. Neuro: Alert and oriented x 3.  Grossly intact. Skin: Warm and dry, no jaundice.   Psych: Alert and cooperative, normal mood and affect.   Labs: CMP     Component Value Date/Time   NA 140 03/04/2018 1010   NA 142 03/28/2016 1519   K 3.3 (L) 03/04/2018 1010   CL 107 03/04/2018 1010   CO2 27 03/04/2018 1010   GLUCOSE 108 (H) 03/04/2018 1010   BUN 6 03/04/2018 1010   BUN 5 (L) 03/28/2016 1519   CREATININE 0.95  03/04/2018 1010   CREATININE 0.86 08/18/2017 0954   CALCIUM 8.7 (L) 03/04/2018 1010   PROT 7.1 11/02/2017 1029   PROT 6.6 10/02/2016 1037   PROT 6.4 05/04/2013 1556   ALBUMIN 3.7 11/02/2017 1029   ALBUMIN 3.7 10/02/2016 1037   ALBUMIN 3.1 (L) 05/04/2013 1556   AST 50 (H) 11/02/2017 1029   AST 38 (H) 05/04/2013 1556   ALT 32 11/02/2017 1029   ALT 39 05/04/2013 1556   ALKPHOS 118 11/02/2017 1029   ALKPHOS 84 05/04/2013 1556   BILITOT 0.7 11/02/2017 1029   BILITOT 0.3 10/02/2016 1037   BILITOT 0.3 05/04/2013 1556   GFRNONAA >60 03/04/2018 1010   GFRNONAA 79 08/18/2017 0954   GFRAA >60 03/04/2018 1010   GFRAA 92 08/18/2017 0954   Lab Results  Component Value Date   WBC 7.1 05/05/2018   HGB 12.8 05/05/2018   HCT 37.2 05/05/2018   MCV 91.2 05/05/2018   PLT 225 05/05/2018    Imaging Studies: Ct Abdomen Pelvis W Contrast  Result Date: 05/17/2018 CLINICAL DATA:  53 year old female with history of fluid around the pancreas on prior ultrasound examination. Abdominal pain for the past 2 weeks. EXAM: CT ABDOMEN AND PELVIS WITH CONTRAST TECHNIQUE: Multidetector CT imaging of the abdomen and pelvis was performed using the standard protocol following bolus administration of  intravenous contrast. CONTRAST:  OMNIPAQUE IOHEXOL 300 MG/ML  SOLN COMPARISON:  CT the abdomen and pelvis 04/11/2013. FINDINGS: Lower chest: Atherosclerotic calcifications in the right coronary artery. Hepatobiliary: No suspicious cystic or solid hepatic lesions. Status post cholecystectomy. No intrahepatic biliary ductal dilatation. Common bile duct is mildly dilated measuring 10 mm in the porta hepatis, likely reflective of benign post cholecystectomy physiology. Pancreas: No pancreatic mass. No pancreatic ductal dilatation. No pancreatic or peripancreatic fluid or inflammatory changes. Spleen: Unremarkable. Adrenals/Urinary Tract: Extensive cortical thinning throughout the mid to lower left kidney. Right kidney and bilateral adrenal glands are normal in appearance. No hydroureteronephrosis. Urinary bladder is normal in appearance. Stomach/Bowel: Postoperative changes of gastric bypass are noted. No pathologic dilatation of small bowel or colon. Mild focal mural thickening in the distal transverse colon, best appreciated on axial image 22 of series 2 and coronal image 56 of series 4. Normal appendix. Vascular/Lymphatic: Aortic atherosclerosis, without evidence of aneurysm or dissection in the abdominal or pelvic vasculature. No lymphadenopathy noted in the abdomen or pelvis. Reproductive: Uterus and ovaries are unremarkable in appearance. Other: No significant volume of ascites.  No pneumoperitoneum. Musculoskeletal: There are no aggressive appearing lytic or blastic lesions noted in the visualized portions of the skeleton. IMPRESSION: 1. No acute findings are noted in the abdomen or pelvis to account for the patient's symptoms. 2. No fluid collections or inflammatory changes surrounding the pancreas on today's study. 3. Focal mural thickening in the distal transverse colon. This could be simply artifactual related to under distention of this portion of the colon during today's examination, however,  correlation with nonemergent colonoscopy is suggested in the near future to exclude the possibility of colonic neoplasm. 4. Aortic atherosclerosis, in addition to least right coronary artery disease. Please note that although the presence of coronary artery calcium documents the presence of coronary artery disease, the severity of this disease and any potential stenosis cannot be assessed on this non-gated CT examination. Assessment for potential risk factor modification, dietary therapy or pharmacologic therapy may be warranted, if clinically indicated. 5. Additional incidental findings, as above. Electronically Signed   By: Trudie Reed M.D.   On:  05/17/2018 14:21    Assessment and Plan:   Cheyenne Gray is a 53 y.o. y/o female here for follow-up of iron deficiency anemia, elevated AST  Patient's recent CT scan reports focal mural thickening in the distal transverse colon The importance of compliance with follow-up and scheduled procedures were discussed in detail.  We discussed that the transverse colon thickening may represent malignancy until this is further evaluated with colonoscopy She verbalized understanding and plans to follow-up with her schedule procedures this time She needs an EGD and colonoscopy for iron deficiency anemia as well However, her iron deficiency is likely due to her gastric bypass as well.  However, given ulceration noted at the anastomosis site in 2015, this needs further evaluation In addition, she had complained of dysphagia in the past which is resolved at this time, but EGD would also allow for biopsies for EOE and ruling out strictures  Previously elevated AST noted I have ordered further blood work that was previously ordered and patient never had it done. This is likely due to underlying fatty liver Recent CT scan did not report any hepatic abnormalities Avoid hepatotoxic drugs including alcohol Weight loss with diet and exercise encouraged as well     Dr Cheyenne BouillonVarnita

## 2018-05-31 NOTE — Addendum Note (Signed)
Addended by: Jackquline Denmark on: 05/31/2018 04:43 PM   Modules accepted: Orders

## 2018-05-31 NOTE — Addendum Note (Signed)
Addended by: Jackquline Denmark on: 05/31/2018 04:41 PM   Modules accepted: Orders, SmartSet

## 2018-06-01 ENCOUNTER — Other Ambulatory Visit: Payer: Self-pay | Admitting: Psychiatry

## 2018-06-01 DIAGNOSIS — F411 Generalized anxiety disorder: Secondary | ICD-10-CM

## 2018-06-01 LAB — HEPATITIS A ANTIBODY, TOTAL: Hep A Total Ab: POSITIVE — AB

## 2018-06-01 LAB — HEPATIC FUNCTION PANEL
ALT: 77 IU/L — AB (ref 0–32)
AST: 167 IU/L — ABNORMAL HIGH (ref 0–40)
Albumin: 4 g/dL (ref 3.8–4.8)
Alkaline Phosphatase: 142 IU/L — ABNORMAL HIGH (ref 39–117)
Bilirubin Total: 0.4 mg/dL (ref 0.0–1.2)
Bilirubin, Direct: 0.14 mg/dL (ref 0.00–0.40)
Total Protein: 6.3 g/dL (ref 6.0–8.5)

## 2018-06-01 LAB — MITOCHONDRIAL/SMOOTH MUSCLE AB PNL
Mitochondrial Ab: <20 Units (ref 0.0–20.0)
Smooth Muscle Ab: 8 Units (ref 0–19)

## 2018-06-01 LAB — CERULOPLASMIN: Ceruloplasmin: 31.2 mg/dL (ref 19.0–39.0)

## 2018-06-01 LAB — HEPATITIS B CORE ANTIBODY, TOTAL: Hep B Core Total Ab: NEGATIVE

## 2018-06-02 ENCOUNTER — Telehealth: Payer: Self-pay | Admitting: Cardiovascular Disease

## 2018-06-02 NOTE — Telephone Encounter (Signed)
Left voicemail message to call back regarding appointment and to review prescreening questions.

## 2018-06-03 ENCOUNTER — Telehealth: Payer: Self-pay

## 2018-06-03 ENCOUNTER — Ambulatory Visit: Payer: Self-pay | Admitting: Licensed Clinical Social Worker
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
## 2018-06-03 NOTE — Telephone Encounter (Signed)
      Primary Cardiologist:  Julien Nordmann, MD   Patient contacted.  History reviewed.  No symptoms to suggest any unstable cardiac conditions.  Based on discussion, with current pandemic situation, we will be postponing this appointment.  If symptoms change, she has been instructed to contact our office.   Routing to C19 CANCEL pool for tracking (P CV DIV CV19 CANCEL) and assigning priority (1 = 4-6 wks, 2 = 6-12 wks, 3 = >12 wks).  Bryna Colander, RN  06/03/2018 3:16 PM         .

## 2018-06-03 NOTE — Telephone Encounter (Signed)
Patient has been informed that we will reschedule her colonoscopy only to 4 weeks and if we are still having to cancel procedure we may have to put her on the urgent list.    Patient is really uncomfortable with this due to having such a strong family history of colon cancer.  I told her that I would make you aware.  Thanks [employer]

## 2018-06-04 ENCOUNTER — Telehealth: Payer: Self-pay

## 2018-06-04 ENCOUNTER — Ambulatory Visit: Payer: Medicare Other | Admitting: Cardiovascular Disease

## 2018-06-04 DIAGNOSIS — R748 Abnormal levels of other serum enzymes: Secondary | ICD-10-CM

## 2018-06-04 NOTE — Telephone Encounter (Signed)
-----   Message from Pasty Spillers, MD sent at 06/03/2018  1:05 PM EDT ----- Eunice Blase please let patient know, her liver enzymes are elevated than before.  Has she used any alcohol recently? If so she should stop all alcohol use.  Are any of her medications new?  She should avoid any herbal or OTC medications including tylenol.   Please order repeat hepatic function panel to be done in 1 week. Please also order Hep A Ab IgM to be done with that lab.

## 2018-06-04 NOTE — Telephone Encounter (Signed)
Pt notified. She has not had any alcohol recently, no new medications. Will have labs done at [facility] lab.

## 2018-06-08 ENCOUNTER — Telehealth: Payer: Medicare Other | Admitting: Cardiovascular Disease

## 2018-06-08 ENCOUNTER — Telehealth: Payer: Self-pay | Admitting: *Deleted

## 2018-06-08 NOTE — Telephone Encounter (Signed)
Patient scheduled for evisit for 06/09/2018

## 2018-06-08 NOTE — Telephone Encounter (Signed)
Spoke with patient and reviewed that provider would like to do a virtual visit to review her questions. She was agreeable to virtual visit and sent her consent via Mychart and instructed her to review and then call us with consent approval. She verbalized understanding with no further questions at this time.

## 2018-06-09 ENCOUNTER — Telehealth (INDEPENDENT_AMBULATORY_CARE_PROVIDER_SITE_OTHER): Payer: Medicare Other | Admitting: Cardiovascular Disease

## 2018-06-09 ENCOUNTER — Other Ambulatory Visit: Payer: Self-pay

## 2018-06-09 DIAGNOSIS — E782 Mixed hyperlipidemia: Secondary | ICD-10-CM

## 2018-06-09 DIAGNOSIS — I5032 Chronic diastolic (congestive) heart failure: Secondary | ICD-10-CM

## 2018-06-09 DIAGNOSIS — I739 Peripheral vascular disease, unspecified: Secondary | ICD-10-CM | POA: Diagnosis not present

## 2018-06-09 DIAGNOSIS — I25118 Atherosclerotic heart disease of native coronary artery with other forms of angina pectoris: Secondary | ICD-10-CM | POA: Diagnosis not present
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
## 2018-06-09 NOTE — Patient Instructions (Addendum)
Medication Instructions:  Please stop Crestor while liver enzymes are elevated We can restart this at a later date and will leave on her list.   If you need a refill on your cardiac medications before your next appointment, please call your pharmacy.     Lab work: No new labs needed   If you have labs (blood work) drawn today and your tests are completely normal, you will receive your results only by: Marland Kitchen MyChart Message (if you have MyChart) OR . A paper copy in the mail If you have any lab test that is abnormal or we need to change your treatment, we will call you to review the results.   Testing/Procedures: No new testing needed   Follow-Up: At [facility], you and your health needs are our priority.  As part of our continuing mission to provide you with exceptional heart care, we have created designated Provider Care Teams.  These Care Teams include your primary Cardiologist (physician) and Advanced Practice Providers (APPs -  Physician Assistants and Nurse Practitioners) who all work together to provide you with the care you need, when you need it.  . You will need a follow up appointment in 12 months .   Please call our office 2 months in advance to schedule this appointment.    . Providers on your designated Care Team:   . Nicolasa Ducking, NP . Eula Listen, PA-C . Marisue Ivan, PA-C  Any Other Special Instructions Will Be Listed Below (If Applicable).  For educational health videos Log in to : [email or web address] Or : [email or web address], password : [email or web address]

## 2018-06-10 ENCOUNTER — Ambulatory Visit: Admission: RE | Admit: 2018-06-10 | Payer: Medicare Other | Source: Home / Self Care | Admitting: Gastroenterology

## 2018-06-10 ENCOUNTER — Ambulatory Visit: Payer: Medicare Other | Admitting: Psychiatry

## 2018-06-10 ENCOUNTER — Encounter: Admission: RE | Payer: Self-pay | Source: Home / Self Care

## 2018-06-10 SURGERY — COLONOSCOPY WITH PROPOFOL
Anesthesia: General

## 2018-06-14 ENCOUNTER — Other Ambulatory Visit: Payer: Self-pay

## 2018-06-14 ENCOUNTER — Encounter: Payer: Self-pay | Admitting: Psychiatry

## 2018-06-14 ENCOUNTER — Ambulatory Visit: Payer: Self-pay | Admitting: Licensed Clinical Social Worker

## 2018-06-14 ENCOUNTER — Ambulatory Visit (INDEPENDENT_AMBULATORY_CARE_PROVIDER_SITE_OTHER): Payer: Medicare Other | Admitting: Psychiatry

## 2018-06-14 DIAGNOSIS — R41840 Attention and concentration deficit: Secondary | ICD-10-CM

## 2018-06-14 DIAGNOSIS — F331 Major depressive disorder, recurrent, moderate: Secondary | ICD-10-CM

## 2018-06-14 DIAGNOSIS — F4310 Post-traumatic stress disorder, unspecified: Secondary | ICD-10-CM | POA: Diagnosis not present

## 2018-06-14 DIAGNOSIS — G4700 Insomnia, unspecified: Secondary | ICD-10-CM | POA: Diagnosis not present

## 2018-06-14 DIAGNOSIS — F411 Generalized anxiety disorder: Secondary | ICD-10-CM | POA: Diagnosis not present

## 2018-06-14 DIAGNOSIS — F1590 Other stimulant use, unspecified, uncomplicated: Secondary | ICD-10-CM

## 2018-06-14 DIAGNOSIS — I25118 Atherosclerotic heart disease of native coronary artery with other forms of angina pectoris: Secondary | ICD-10-CM | POA: Diagnosis not present

## 2018-06-14 NOTE — Progress Notes (Signed)
Virtual Visit via Telephone Note  I connected with Cheyenne Gray on 06/14/18 at 11:15 AM EDT by telephone and verified that I am speaking with the correct person using two identifiers.   I discussed the limitations, risks, security and privacy concerns of performing an evaluation and management service by telephone and the availability of in person appointments. I also discussed with the patient that there may be a patient responsible charge related to this service. The patient expressed understanding and agreed to proceed.   I discussed the assessment and treatment plan with the patient. The patient was provided an opportunity to ask questions and all were answered. The patient agreed with the plan and demonstrated an understanding of the instructions.   The patient was advised to call back or seek an in-person evaluation if the symptoms worsen or if the condition fails to improve as anticipated.  I provided 15 minutes of non-face-to-face time during this encounter.   Cheyenne Alert, MD  [facility] MD OP Progress Note  06/14/2018 1:59 PM Cheyenne Gray  MRN:  [id number]  Chief Complaint:  Chief Complaint    Follow-up     HPI: Cheyenne Gray is a 53 year old Caucasian female, widowed, on disability, has a history of PTSD, GAD, MDD, insomnia, caffeine use disorder, history of prolonged QT, chronic back pain on methadone, vitamin B12 deficiency, heart failure, iron deficiency, history of gastric bypass, non-ST elevation MI, hypertension, was evaluated by phone today.  Patient today reports because of the COVID-19 outbreak she has been staying indoors more.  She reports a lot of her appointments including her endoscopy got rescheduled due to the same.  She is a bit anxious about that.  She however reports she has good social support from her daughter who is currently home.  She has been coping okay.  Patient reports she is tolerating her medications well.  She denies any side effects.  She reports  sleep is good.  She denies any suicidality, homicidality or perceptual disturbances.  She denies any other concerns today. Visit Diagnosis:    ICD-10-CM   1. PTSD (post-traumatic stress disorder) F43.10   2. GAD (generalized anxiety disorder) F41.1   3. MDD (major depressive disorder), recurrent episode, moderate (HCC) F33.1   4. Insomnia, unspecified type G47.00   5. 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 Attention and concentration deficit R41.840     Past Psychiatric History: I have reviewed past psychiatric history from my progress note on 11/06/2017.  Past trials of Paxil, Cymbalta, Valium, melatonin, trazodone.  Past Medical History:  Past Medical History:  Diagnosis Date  . (HFpEF) heart failure with preserved ejection fraction ([place])    a. Echo 2014: EF 65-70%, nl WM, mildly dilated LA, PASP nl; b. 12/2014 Echo: EF 65-70%, no rwma, mod septal hypertrophy w/o LVOT gradient or SAM; c. 07/2017 Echo: EF 55-60%, no rwma, mildly dil RV w/ nl syst fxn. Mildly dil RA. Dilated IVC w/ elevated CVP. Triv post effusion.  Marland Kitchen Anxiety   . Arthritis   . Asthma   . B12 deficiency   . Chronic back pain   . Chronic headaches   . Chronic pain    a. on methadone  . Concussion    hx of 4  . Coronary artery disease, non-occlusive    a. LHC 1/18: proximal to mid LAD 40% stenosed, mid LAD 30% stenosed, mid RCA 20% stenosed, distal RCA 20% stenosed, EF 55-65%, LVEDP normal  . Depression   . DJD (degenerative joint  disease), multiple sites   . Frequent headaches   . Gallstone   . H/O non-ST elevation myocardial infarction (NSTEMI)   . Hand, foot and mouth disease 2016  . History of shingles   . Hypertension   . Iron deficiency anemia   . Long QT interval   . Methadone use ([place])    managed by Dr. Primus Bravo  . Migraine   . Obesity   . Palpitations    a. 24 hour Holter: NSR, sinus brady down to 48, occasional PVCs & couplets, 8 beats NSVT; b. 30 day event monitor 2015: NSR with rare PVC.  Marland Kitchen Psoriasis    . Syncope and collapse   . Vitamin D deficiency     Past Surgical History:  Procedure Laterality Date  . ABDOMINOPLASTY     tummy tuck ? year   . [place] SURGERY  2001  . CARDIAC CATHETERIZATION Left 04/16/2016   Procedure: Left Heart Cath and Coronary Angiography;  Surgeon: [name], MD;  Location: [facility];  Service: Cardiovascular;  Laterality: Left;  . CHOLECYSTECTOMY  2001  . GALLBLADDER SURGERY    . GASTRIC BYPASS  2001  . GASTROPLASTY      Family Psychiatric History: I have reviewed family psychiatric history from my progress note on 11/06/2017.  Family History:  Family History  Problem Relation Age of Onset  . Heart attack Mother   . Cancer Mother        pancreatitic   . Early death Mother   . Heart attack Father 39       MI  . Early death Father   . Heart disease Father   . Heart attack Brother   . Heart disease Brother   . Arthritis Brother   . Depression Brother   . Diabetes Brother   . Heart attack Maternal Grandmother   . Cancer Maternal Grandmother        pancreatitic   . Heart disease Maternal Grandmother   . Cancer Maternal Uncle        pancreatitic   . Cancer Paternal Grandmother        ? type   . Diabetes Paternal Grandmother   . Cancer Maternal Uncle        pancreatitic   . Breast cancer Maternal Aunt     Social History: Reviewed social history from my progress note on 11/06/2017. Social History   Socioeconomic History  . Marital status: Widowed    Spouse name: Not on file  . Number of children: 1  . Years of education: assoc degree  . Highest education level: Associate degree: occupational, Hotel manager, or vocational program  Occupational History  . Not on file  Social Needs  . Financial resource strain: Very hard  . Food insecurity:    Worry: Never true    Inability: Never true  . Transportation needs:    Medical: No    Non-medical: No  Tobacco Use  . Smoking status: Never Smoker  . Smokeless tobacco: Never  Used  Substance and Sexual Activity  . Alcohol use: No    Alcohol/week: 0.0 standard drinks    Comment: holidays  . Drug use: No  . Sexual activity: Not Currently  Lifestyle  . Physical activity:    Days per week: 0 days    Minutes per session: 0 min  . Stress: Very much  Relationships  . Social connections:    Talks on phone: Once a week    Gets together: Never  Attends religious service: Never    Active member of club or organization: No    Attends meetings of clubs or organizations: Never    Relationship status: Widowed  Other Topics Concern  . 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 Penicillins Hives, Shortness Of Breath and Rash    Has patient had a PCN reaction causing immediate rash, facial/tongue/throat swelling, SOB or lightheadedness with hypotension: Yes Has patient had a PCN reaction causing severe rash involving mucus membranes or skin necrosis: No Has patient had a PCN reaction that required hospitalization No Has patient had a PCN reaction occurring within the last 10 years: No If all of the above answers are "NO", then may proceed with Cephalosporin use.     Metabolic Disorder Labs: No results found for: HGBA1C, MPG No results found for: PROLACTIN Lab Results  Component Value Date   CHOL 114 10/02/2016   TRIG 59 10/02/2016   HDL 55 10/02/2016   CHOLHDL 2.1 10/02/2016   VLDL 14 01/30/2015   LDLCALC 47 10/02/2016   LDLCALC 94 01/30/2015   Lab Results  Component Value Date   TSH 3.037 07/26/2017    Therapeutic Level Labs: No results found for: LITHIUM No results found for: VALPROATE No components found for:  CBMZ  Current Medications: Current Outpatient Medications  Medication Sig Dispense Refill  . albuterol  (PROVENTIL HFA;VENTOLIN HFA) 108 (90 BASE) MCG/ACT inhaler Inhale 2 puffs into the lungs as needed for wheezing.    . busPIRone (BUSPAR) 15 MG tablet Take 1 tablet (15 mg total) by mouth 2 (two) times daily. 180 tablet 0  . Cholecalciferol (VITAMIN D3) 1.25 MG (50000 UT) CAPS TAKE ONE CAPSULE BY MOUTH ONE TIME PER WEEK    . diclofenac sodium (VOLTAREN) 1 % GEL Apply 4 g topically 4 (four) times daily. Prn knees and 2 grams qid prn hands arthritis 100 g 11  . DULoxetine (CYMBALTA) 60 MG capsule TAKE 1 CAPSULE BY MOUTH EVERY DAY 90 capsule 0  . furosemide (LASIX) 40 MG tablet     . gabapentin (NEURONTIN) 400 MG capsule Limit 2 tablets in the a.m. and midday and 3 tablets each evening    Please dispense a three-month supply 630 capsule 0  . isosorbide mononitrate (IMDUR) 60 MG 24 hr tablet Take 1 tablet (60 mg total) by mouth daily. 90 tablet 3  . methadone (DOLOPHINE) 10 MG tablet Limit 1-2 tablets by mouth 2-3 times per day if tolerated 180 tablet 0  . nitroGLYCERIN (NITROSTAT) 0.4 MG SL tablet Place 1 tablet (0.4 mg total) under the tongue every 5 (five) minutes as needed for chest pain. 25 tablet 3  . rOPINIRole (REQUIP) 0.25 MG tablet Take 2-3 tablets (0.5-0.75 mg total) by mouth at bedtime. 90 tablet 11  . rosuvastatin (CRESTOR) 10 MG tablet TAKE 1 TABLET BY MOUTH EVERY DAY 90 tablet 1  . SUMAtriptan (IMITREX) 100 MG tablet TAKE 1 TABLET BY MOUTH AS NEEDED FOR HEADACHE. MAY REPEAT IN 2 HRS IF NEEDED. MAX 200MG/DAY  5  . tiZANidine (ZANAFLEX) 2 MG tablet     . zolpidem (AMBIEN) 5 MG tablet Take 1 tablet (5 mg total) by mouth  at bedtime as needed for sleep. 30 tablet 2   Current Facility-Administered Medications  Medication Dose Route Frequency Provider Last Rate Last Dose  . bupivacaine (PF) (MARCAINE) 0.25 % injection 30 mL  30 mL Other Once Mohammed Kindle, MD      . bupivacaine (PF) (MARCAINE) 0.25 % injection 30 mL  30 mL Other Once Mohammed Kindle, MD      . fentaNYL (SUBLIMAZE) injection  100 mcg  100 mcg Intravenous Once Mohammed Kindle, MD      . fentaNYL (SUBLIMAZE) injection 100 mcg  100 mcg Intravenous Once Mohammed Kindle, MD      . lactated ringers infusion 1,000 mL  1,000 mL Intravenous Continuous Mohammed Kindle, MD      . lactated ringers infusion 1,000 mL  1,000 mL Intravenous Continuous Mohammed Kindle, MD      . lactated ringers infusion 1,000 mL  1,000 mL Intravenous Continuous Mohammed Kindle, MD 125 mL/hr at 08/06/15 1002 1,000 mL at 08/06/15 1002  . lidocaine (PF) (XYLOCAINE) 1 % injection 10 mL  10 mL Subcutaneous Once Mohammed Kindle, MD      . midazolam (VERSED) 5 MG/5ML injection 5 mg  5 mg Intravenous Once Mohammed Kindle, MD      . orphenadrine (NORFLEX) injection 60 mg  60 mg Intramuscular Once Mohammed Kindle, MD      . orphenadrine (NORFLEX) injection 60 mg  60 mg Intramuscular Once Mohammed Kindle, MD      . triamcinolone acetonide (KENALOG-40) injection 40 mg  40 mg Other Once Mohammed Kindle, MD      . triamcinolone acetonide (KENALOG-40) injection 40 mg  40 mg Other Once Mohammed Kindle, MD         Musculoskeletal: Strength & Muscle Tone: [place]: UTA Patient leans: N/A  Psychiatric Specialty Exam: Review of Systems  Psychiatric/Behavioral: The patient is nervous/anxious (improving).   All other systems reviewed and are negative.   Last menstrual period 02/18/2016.There is no height or weight on file to calculate BMI.  General Appearance: UTA  Eye Contact:  UTA  Speech:  Clear and Coherent  Volume:  Normal  Mood:  Anxious  Affect:  NA  Thought Process:  Goal Directed and Descriptions of Associations: Intact  Orientation:  Full (Time, Place, and Person)  Thought Content: Logical   Suicidal Thoughts:  No  Homicidal Thoughts:  No  Memory:  Immediate;   Fair Recent;   Fair Remote;   Fair  Judgement:  Fair  Insight:  Fair  Psychomotor Activity:  UTA  Concentration:  Concentration: Fair and Attention Span: Fair  Recall:  [employer]  of Knowledge: Fair  Language: Fair  Akathisia:  No  Handed:  Right  AIMS (if indicated): na  Assets:  Communication Skills Desire for Improvement Housing Social Support  ADL's:  Intact  Cognition: WNL  Sleep:  improving   Screenings: GAD-7     Office Visit from 09/21/2015 in [facility]  Total GAD-7 Score  4    Mini-Mental     Office Visit from 10/30/2017 in [facility]  Total Score (max 30 points )  27    PHQ2-9     Office Visit from 08/18/2017 in [facility] Office Visit from 11/15/2015 in [facility] Office Visit from 10/18/2015 in [facility] Office Visit from 09/21/2015 in [facility] Office Visit from  07/25/2015 in [facility]  PHQ-2 Total Score  0  0  0  0  0       Assessment and Plan: Layla is a 53 yr old Caucasian female, widowed, has a history of PTSD, anxiety, depression, multiple medical problems including coronary artery disease, history of NSTEMI, long QT per history, gastric bypass, vitamin D and vitamin B12 deficiency, chronic pain on methadone, was evaluated by phone today.  Patient is biologically predisposed given her history of multiple medical problems, trauma as well as family history of mental health issues.  Patient currently is anxious about the COVID-19 outbreak.  She however has good social support system and is compliant on medications.  Plan as noted below  Plan PTSD-improving Cymbalta 60 mg p.o. daily.  Patient is on reduced dosage due to nausea on higher dosage. BuSpar 15 mg p.o. twice daily Continue psychotherapy with therapist here in clinic.  For MDD-improving Cymbalta as prescribed Continue CBT  For GAD-improving BuSpar and Cymbalta as prescribed.  For insomnia-improving Ambien 5 mg p.o.  nightly.  Caffeine use disorder-improving Patient is trying to cut down.  For memory problems-we will continue to monitor closely.  MMSE completed on 11/06/2017-30 out of 30.  She will continue B12 and vitamin D management per PMD.  For attention and concentration problems-pending ADHD testing-was referred for the same.  Follow-up in clinic in 1 month  or sooner if needed.  I have spent atleast 15 minutes non face to face with patient today. More than 50 % of the time was spent for psychoeducation and supportive psychotherapy and care coordination.  This note was generated in part or whole with voice recognition software. Voice recognition is usually quite accurate but there are transcription errors that can and very often do occur. I apologize for any typographical errors that were not detected and corrected.      Cheyenne Alert, MD 06/14/2018, 1:59 PM

## 2018-06-17 ENCOUNTER — Ambulatory Visit: Payer: Medicare Other | Admitting: Licensed Clinical Social Worker

## 2018-06-17 ENCOUNTER — Other Ambulatory Visit: Payer: Self-pay

## 2018-06-17 ENCOUNTER — Telehealth: Payer: Self-pay

## 2018-06-17 DIAGNOSIS — D508 Other iron deficiency anemias: Secondary | ICD-10-CM

## 2018-06-17 DIAGNOSIS — Z1211 Encounter for screening for malignant neoplasm of colon: Secondary | ICD-10-CM

## 2018-06-17 NOTE — Telephone Encounter (Signed)
Patient has been rescheduled her screening colonoscopy w/EGD for May 1st with Dr. Maximino Greenland.  Thanks [employer]

## 2018-06-22 ENCOUNTER — Other Ambulatory Visit: Payer: Self-pay | Admitting: Gastroenterology

## 2018-06-22 ENCOUNTER — Telehealth: Payer: Self-pay

## 2018-06-22 DIAGNOSIS — R1084 Generalized abdominal pain: Secondary | ICD-10-CM

## 2018-06-22 DIAGNOSIS — R748 Abnormal levels of other serum enzymes: Secondary | ICD-10-CM

## 2018-06-22 DIAGNOSIS — D508 Other iron deficiency anemias: Secondary | ICD-10-CM

## 2018-06-22 NOTE — Telephone Encounter (Signed)
(  Tati please schedule televisit with Dr. Sharilyn Sites 2-3 weeks)  LVM informing patient her procedures scheduled for May 1st have been canceled due to COVID19.  We are not rescheduling per Dr.Tahiliani's message, however Dr. Maximino Greenland has requested pt to be scheduled for televisit in 2-3 weeks.  Thanks [employer]

## 2018-06-24 ENCOUNTER — Encounter: Payer: Self-pay | Admitting: Internal Medicine

## 2018-06-24 ENCOUNTER — Telehealth: Payer: Self-pay

## 2018-06-24 NOTE — Telephone Encounter (Signed)
-----   Message from Pasty Spillers, MD sent at 06/22/2018 12:05 PM EDT ----- Cheyenne Gray, on my last result note I had requested Hepatic function panel and Hep A IgM to be ordered. I do not see these ordered. I just ordered them today and a CT scan. Please ask pt to get the labs done before her CT scan. Please tell her that since we need to reschedule her procedure, the CT will help assess if the thickening seen on her colon is still present and allow [place] to see if a colonoscopy can be done at a later time when the Coronavirus situation improves.

## 2018-06-24 NOTE — Telephone Encounter (Signed)
Pt aware of need to have blood work done and will try to do today at [facility]. Also CT scheduled for 07/08/2018 at the [facility] at 8:30 am, arrival time is 8:15 am. To pick up prep at [facility] and instructions. Nothing to eat or drink 4 hrs prior to CT. Pt is okay with My Chart message for this appt. Will send.

## 2018-06-24 NOTE — Telephone Encounter (Deleted)
-----   Message from Varnita B Tahiliani, MD sent at 06/22/2018 12:05 PM EDT ----- Cheyenne Gray, on my last result note I had requested Hepatic function panel and Hep A IgM to be ordered. I do not see these ordered. I just ordered them today and a CT scan. Please ask pt to get the labs done before her CT scan. Please tell her that since we need to reschedule her procedure, the CT will help assess if the thickening seen on her colon is still present and allow us to see if a colonoscopy can be done at a later time when the Coronavirus situation improves.   

## 2018-06-25 ENCOUNTER — Ambulatory Visit: Payer: Self-pay

## 2018-06-25 NOTE — Telephone Encounter (Signed)
Incoming call from Patient who complains of feeling achy no energy, sweaty, diarrhea and no appetite for 2 days.  SOB Dry cough.  Temp 97.5  Sweaty.  Patient has a history of Asthma an a heart condition.  Reports that she feels worse. Than yesterday.  Also has headaches.  Provided care advice . Provided contact number for  [place]  [facility].  Voiced understanding. Encourage Patient to self quarantine and monitor temperature .  If Sx. Worsen go to ED .      Reason for Disposition . COVID-19 Testing, questions about  Answer Assessment - Initial Assessment Questions 1. COVID-19 DIAGNOSIS: "Who made your Coronavirus (COVID-19) diagnosis?" "Was it confirmed by a positive lab test?" If not diagnosed by a HCP, ask "Are there lots of cases (community spread) where you live?" (See public health department website, if unsure)   * MAJOR community spread: high number of cases; numbers of cases are increasing; many people hospitalized.   * MINOR community spread: low number of cases; not increasing; few or no people hospitalized     *No Answer* 2. ONSET: "When did the COVID-19 symptoms start?"      2 days ago 3. WORST SYMPTOM: "What is your worst symptom?" (e.g., cough, fever, shortness of breath, muscle aches)    Shortness  4. COUGH: "How bad is the cough?"       Dry cough 5. FEVER: "Do you have a fever?" If so, ask: "What is your temperature, how was it measured, and when did it start?"     97.5 6. RESPIRATORY STATUS: "Describe your breathing?" (e.g., shortness of breath, wheezing, unable to speak)      SOB occasionall 7. BETTER-SAME-WORSE: "Are you getting better, staying the same or getting worse compared to yesterday?"  If getting worse, ask, "In what way?"     Feel worse  8. HIGH RISK DISEASE: "Do you have any chronic medical problems?" (e.g., asthma, heart or lung disease, weak immune system, etc.)     A hma,  Heart condition 9. PREGNANCY: "Is there any chance you are pregnant?" "When  was your last menstrual period?"      10. OTHER SYMPTOMS: "Do you have any other symptoms?"  (e.g., runny nose, headache, sore throat, loss of smell)       Headache no appetite  Protocols used: CORONAVIRUS (COVID-19) DIAGNOSED OR SUSPECTED-A-AH

## 2018-06-28 ENCOUNTER — Encounter: Payer: Self-pay | Admitting: Internal Medicine

## 2018-06-28 ENCOUNTER — Telehealth: Payer: Self-pay | Admitting: Gastroenterology

## 2018-06-28 NOTE — Telephone Encounter (Signed)
Left vm for pt to call office and schedule 2-3 week Video apt with Dr. Maximino Greenland per Marcelino Duster Note

## 2018-06-28 NOTE — Telephone Encounter (Signed)
Agree advise ED if worse  Thanks TMS

## 2018-06-29 ENCOUNTER — Telehealth: Payer: Self-pay | Admitting: Internal Medicine

## 2018-06-29 NOTE — Telephone Encounter (Signed)
Pt can take vitamin D3 5000 IU daily otc stop weekly 50K vitamin D3    TMS

## 2018-06-30 NOTE — Telephone Encounter (Signed)
Left detailed VM to inform patient

## 2018-07-01 ENCOUNTER — Ambulatory Visit: Payer: Medicare Other | Admitting: Internal Medicine

## 2018-07-01 ENCOUNTER — Other Ambulatory Visit: Payer: Self-pay

## 2018-07-06 ENCOUNTER — Other Ambulatory Visit
Admission: RE | Admit: 2018-07-06 | Discharge: 2018-07-06 | Disposition: A | Discharge status: Home / Self Care | Payer: Medicare Other | Source: Ambulatory Visit | Attending: Gastroenterology | Admitting: Gastroenterology

## 2018-07-06 ENCOUNTER — Other Ambulatory Visit: Payer: Self-pay

## 2018-07-06 DIAGNOSIS — D508 Other iron deficiency anemias: Secondary | ICD-10-CM | POA: Insufficient documentation

## 2018-07-06 DIAGNOSIS — R748 Abnormal levels of other serum enzymes: Secondary | ICD-10-CM | POA: Insufficient documentation

## 2018-07-06 DIAGNOSIS — R1084 Generalized abdominal pain: Secondary | ICD-10-CM | POA: Insufficient documentation

## 2018-07-06 LAB — HEPATIC FUNCTION PANEL
ALT: 16 U/L (ref 0–44)
AST: 30 U/L (ref 15–41)
Albumin: 4 g/dL (ref 3.5–5.0)
Alkaline Phosphatase: 94 U/L (ref 38–126)
Bilirubin, Direct: <0.1 mg/dL (ref 0.0–0.2)
Total Bilirubin: 0.5 mg/dL (ref 0.3–1.2)
Total Protein: 7.6 g/dL (ref 6.5–8.1)

## 2018-07-06 LAB — CREATININE, SERUM
Creatinine, Ser: 0.97 mg/dL (ref 0.44–1.00)
GFR calc Af Amer: >60 mL/min (ref >60)
GFR calc non Af Amer: >60 mL/min (ref >60)

## 2018-07-06 LAB — PREGNANCY, URINE: Preg Test, Ur: NEGATIVE

## 2018-07-07 ENCOUNTER — Telehealth: Payer: Self-pay | Admitting: Gastroenterology

## 2018-07-07 ENCOUNTER — Telehealth: Payer: Self-pay

## 2018-07-07 LAB — HEPATITIS A ANTIBODY, IGM: Hep A IgM: NEGATIVE

## 2018-07-07 NOTE — Telephone Encounter (Signed)
Pt is calling for Cheyenne Gray she would like to speak with her regarding her CT for tomorrow she feels she does not need it if she has to do it again before her procedure please call pt

## 2018-07-07 NOTE — Telephone Encounter (Signed)
Left vm for pt to call office and schedule video apt with Dr. Maximino Greenland

## 2018-07-07 NOTE — Telephone Encounter (Signed)
Left detailed message on voice and will send via My Chart that labs were now normal and to proceed with CT tomorrow. This will be done at [facility] instead of [facility].  CT-07/08/18 at [facility] Arrival time: 8:15 am, CT 8:30 am. If not already picked up prep, please do so today prior to your appt at the hospital. Check at registration desk as to where to get your prep. Nothing to eat or drink 4 hrs prior to procedure. If you need to reschedule please contact central scheduling at [phone].

## 2018-07-07 NOTE — Telephone Encounter (Signed)
-----   Message from Pasty Spillers, MD sent at 07/07/2018 10:00 AM EDT ----- Eunice Blase please let patient know, her liver enzymes are now normal. Proceed with CT scan as ordered

## 2018-07-07 NOTE — Telephone Encounter (Signed)
I spoke with pt and she has canceled her CT scan for tomorrow. Her colonoscopy was canceled x2, the last one being for 5/1 due to the covid 19 virus. She prefers to have one or both if necessary but is confused. She thought she was supposed to have the colonoscopy first then CT (she had one in March). The prep makes her sick for both but she will do what ever she needs to do. Please clarify as to whether the colonoscopy is first, and the urgency or CT scan. Maybe she needs a telemed visit. Thanks!

## 2018-07-08 ENCOUNTER — Ambulatory Visit: Admission: RE | Admit: 2018-07-08 | Payer: Medicare Other | Source: Ambulatory Visit

## 2018-07-14 ENCOUNTER — Ambulatory Visit (INDEPENDENT_AMBULATORY_CARE_PROVIDER_SITE_OTHER): Payer: Medicare Other | Admitting: Internal Medicine

## 2018-07-14 DIAGNOSIS — E559 Vitamin D deficiency, unspecified: Secondary | ICD-10-CM

## 2018-07-14 DIAGNOSIS — R5382 Chronic fatigue, unspecified: Secondary | ICD-10-CM | POA: Diagnosis not present

## 2018-07-14 DIAGNOSIS — G4700 Insomnia, unspecified: Secondary | ICD-10-CM

## 2018-07-14 DIAGNOSIS — R935 Abnormal findings on diagnostic imaging of other abdominal regions, including retroperitoneum: Secondary | ICD-10-CM | POA: Diagnosis not present

## 2018-07-14 DIAGNOSIS — Z1231 Encounter for screening mammogram for malignant neoplasm of breast: Secondary | ICD-10-CM

## 2018-07-14 DIAGNOSIS — Z780 Asymptomatic menopausal state: Secondary | ICD-10-CM

## 2018-07-14 DIAGNOSIS — R5383 Other fatigue: Secondary | ICD-10-CM | POA: Insufficient documentation

## 2018-07-14 DIAGNOSIS — Z113 Encounter for screening for infections with a predominantly sexual mode of transmission: Secondary | ICD-10-CM

## 2018-07-14 DIAGNOSIS — R739 Hyperglycemia, unspecified: Secondary | ICD-10-CM

## 2018-07-14 DIAGNOSIS — R945 Abnormal results of liver function studies: Secondary | ICD-10-CM

## 2018-07-14 DIAGNOSIS — Z13220 Encounter for screening for lipoid disorders: Secondary | ICD-10-CM

## 2018-07-14 DIAGNOSIS — F5105 Insomnia due to other mental disorder: Secondary | ICD-10-CM | POA: Insufficient documentation

## 2018-07-14 DIAGNOSIS — I2510 Atherosclerotic heart disease of native coronary artery without angina pectoris: Secondary | ICD-10-CM

## 2018-07-14 DIAGNOSIS — R7989 Other specified abnormal findings of blood chemistry: Secondary | ICD-10-CM

## 2018-07-14 NOTE — Progress Notes (Signed)
Virtual Visit via Video Note  I connected with Cheyenne Gray  on 07/14/18 at  3:40 PM EDT by a video enabled telemedicine application and verified that I am speaking with the correct person using two identifiers.  Location patient: home Location provider:work Persons participating in the virtual visit: patient, provider, Pts daughter Cheyenne Gray  I discussed the limitations of evaluation and management by telemedicine and the availability of in person appointments. The patient expressed understanding and agreed to proceed.   HPI: 1. Abnormal CT abdomen pelvis 05/17/2018-pt wants to know when EGD colonoscopy could be scheduled to work this up by GI   IMPRESSION: 1. No acute findings are noted in the abdomen or pelvis to account for the patient's symptoms. 2. No fluid collections or inflammatory changes surrounding the pancreas on today's study. 3. Focal mural thickening in the distal transverse colon. This could be simply artifactual related to under distention of this portion of the colon during today's examination, however, correlation with nonemergent colonoscopy is suggested in the near future to exclude the possibility of colonic neoplasm. 4. Aortic atherosclerosis, in addition to least right coronary artery disease. Please note that although the presence of coronary artery calcium documents the presence of coronary artery disease, the severity of this disease and any potential stenosis cannot be assessed on this non-gated CT examination. Assessment for potential risk factor modification, dietary therapy or pharmacologic therapy may be warranted, if clinically indicated. 5. Additional incidental findings, as above.  2. Fatigue x 2 weeks to 1 month ? If menopause related. Weight is stable  3. C/o insomnia sleeping 3-4 hrs at night unsure if has been on trazadone before for sleep with dis with psychiatry sleep as needs to get more she has been unable to sleep since her husband died years  ago. Mood is ok not depressed.  ROS: See pertinent positives and negatives per HPI.  Past Medical History:  Diagnosis Date  . (HFpEF) heart failure with preserved ejection fraction ([place])    a. Echo 2014: EF 65-70%, nl WM, mildly dilated LA, PASP nl; b. 12/2014 Echo: EF 65-70%, no rwma, mod septal hypertrophy w/o LVOT gradient or SAM; c. 07/2017 Echo: EF 55-60%, no rwma, mildly dil RV w/ nl syst fxn. Mildly dil RA. Dilated IVC w/ elevated CVP. Triv post effusion.  Marland Kitchen Anxiety   . Arthritis   . Asthma   . B12 deficiency   . Chronic back pain   . Chronic headaches   . Chronic pain    a. on methadone  . Concussion    hx of 4  . Coronary artery disease, non-occlusive    a. LHC 1/18: proximal to mid LAD 40% stenosed, mid LAD 30% stenosed, mid RCA 20% stenosed, distal RCA 20% stenosed, EF 55-65%, LVEDP normal  . Depression   . DJD (degenerative joint disease), multiple sites   . Frequent headaches   . Gallstone   . H/O non-ST elevation myocardial infarction (NSTEMI)   . Hand, foot and mouth disease 2016  . History of shingles   . Hypertension   . Iron deficiency anemia   . Long QT interval   . Methadone use ([place])    managed by Dr. Primus Bravo  . Migraine   . Obesity   . Palpitations    a. 24 hour Holter: NSR, sinus brady down to 48, occasional PVCs & couplets, 8 beats NSVT; b. 30 day event monitor 2015: NSR with rare PVC.  Marland Kitchen Psoriasis   . Syncope and collapse   .  Vitamin D deficiency     Past Surgical History:  Procedure Laterality Date  . ABDOMINOPLASTY     tummy tuck ? year   . [place] SURGERY  2001  . CARDIAC CATHETERIZATION Left 04/16/2016   Procedure: Left Heart Cath and Coronary Angiography;  Surgeon: [name], MD;  Location: [facility];  Service: Cardiovascular;  Laterality: Left;  . CHOLECYSTECTOMY  2001  . GALLBLADDER SURGERY    . GASTRIC BYPASS  2001  . GASTROPLASTY      Family History  Problem Relation Age of Onset  . Heart attack Mother   .  Cancer Mother        pancreatitic   . Early death Mother   . Heart attack Father 51       MI  . Early death Father   . Heart disease Father   . Heart attack Brother   . Heart disease Brother   . Arthritis Brother   . Depression Brother   . Diabetes Brother   . Heart attack Maternal Grandmother   . Cancer Maternal Grandmother        pancreatitic   . Heart disease Maternal Grandmother   . Cancer Maternal Uncle        pancreatitic   . Cancer Paternal Grandmother        ? type   . Diabetes Paternal Grandmother   . Cancer Maternal Uncle        pancreatitic   . Breast cancer Maternal Aunt     SOCIAL HX: lives at home with college aged daughter Cheyenne Gray    Current Outpatient Medications:  .  albuterol (PROVENTIL HFA;VENTOLIN HFA) 108 (90 BASE) MCG/ACT inhaler, Inhale 2 puffs into the lungs as needed for wheezing., Disp: , Rfl:  .  busPIRone (BUSPAR) 15 MG tablet, Take 1 tablet (15 mg total) by mouth 2 (two) times daily., Disp: 180 tablet, Rfl: 0 .  Cholecalciferol (VITAMIN D3) 1.25 MG (50000 UT) CAPS, TAKE ONE CAPSULE BY MOUTH ONE TIME PER WEEK, Disp: , Rfl:  .  diclofenac sodium (VOLTAREN) 1 % GEL, Apply 4 g topically 4 (four) times daily. Prn knees and 2 grams qid prn hands arthritis, Disp: 100 g, Rfl: 11 .  DULoxetine (CYMBALTA) 60 MG capsule, TAKE 1 CAPSULE BY MOUTH EVERY DAY, Disp: 90 capsule, Rfl: 0 .  furosemide (LASIX) 40 MG tablet, , Disp: , Rfl:  .  gabapentin (NEURONTIN) 400 MG capsule, Limit 2 tablets in the a.m. and midday and 3 tablets each evening    Please dispense a three-month supply, Disp: 630 capsule, Rfl: 0 .  isosorbide mononitrate (IMDUR) 60 MG 24 hr tablet, Take 1 tablet (60 mg total) by mouth daily., Disp: 90 tablet, Rfl: 3 .  methadone (DOLOPHINE) 10 MG tablet, Limit 1-2 tablets by mouth 2-3 times per day if tolerated, Disp: 180 tablet, Rfl: 0 .  nitroGLYCERIN (NITROSTAT) 0.4 MG SL tablet, Place 1 tablet (0.4 mg total) under the tongue every 5 (five) minutes as  needed for chest pain., Disp: 25 tablet, Rfl: 3 .  rOPINIRole (REQUIP) 0.25 MG tablet, Take 2-3 tablets (0.5-0.75 mg total) by mouth at bedtime., Disp: 90 tablet, Rfl: 11 .  rosuvastatin (CRESTOR) 10 MG tablet, TAKE 1 TABLET BY MOUTH EVERY DAY, Disp: 90 tablet, Rfl: 1 .  SUMAtriptan (IMITREX) 100 MG tablet, TAKE 1 TABLET BY MOUTH AS NEEDED FOR HEADACHE. MAY REPEAT IN 2 HRS IF NEEDED. MAX 200MG/DAY, Disp: , Rfl: 5 .  Suvorexant (BELSOMRA) 5  MG TABS, Take 5 mg by mouth at bedtime., Disp: 15 tablet, Rfl: 0 .  tiZANidine (ZANAFLEX) 2 MG tablet, , Disp: , Rfl:   Current Facility-Administered Medications:  .  bupivacaine (PF) (MARCAINE) 0.25 % injection 30 mL, 30 mL, Other, Once, Mohammed Kindle, MD .  bupivacaine (PF) (MARCAINE) 0.25 % injection 30 mL, 30 mL, Other, Once, Mohammed Kindle, MD .  fentaNYL (SUBLIMAZE) injection 100 mcg, 100 mcg, Intravenous, Once, Mohammed Kindle, MD .  fentaNYL (SUBLIMAZE) injection 100 mcg, 100 mcg, Intravenous, Once, Mohammed Kindle, MD .  lactated ringers infusion 1,000 mL, 1,000 mL, Intravenous, Continuous, Mohammed Kindle, MD .  lactated ringers infusion 1,000 mL, 1,000 mL, Intravenous, Continuous, Mohammed Kindle, MD .  lactated ringers infusion 1,000 mL, 1,000 mL, Intravenous, Continuous, Mohammed Kindle, MD, Last Rate: 125 mL/hr at 08/06/15 1002, 1,000 mL at 08/06/15 1002 .  lidocaine (PF) (XYLOCAINE) 1 % injection 10 mL, 10 mL, Subcutaneous, Once, Mohammed Kindle, MD .  midazolam (VERSED) 5 MG/5ML injection 5 mg, 5 mg, Intravenous, Once, Mohammed Kindle, MD .  orphenadrine (NORFLEX) injection 60 mg, 60 mg, Intramuscular, Once, Mohammed Kindle, MD .  orphenadrine (NORFLEX) injection 60 mg, 60 mg, Intramuscular, Once, Mohammed Kindle, MD .  triamcinolone acetonide (KENALOG-40) injection 40 mg, 40 mg, Other, Once, Mohammed Kindle, MD .  triamcinolone acetonide (KENALOG-40) injection 40 mg, 40 mg, Other, Once, Mohammed Kindle, MD  EXAMTonette Bihari per patient if  applicable:  GENERAL: alert, oriented, appears well and in no acute distress  HEENT: atraumatic, conjunttiva clear, no obvious abnormalities on inspection of external nose and ears  NECK: normal movements of the head and neck  LUNGS: on inspection no signs of respiratory distress, breathing rate appears normal, no obvious gross SOB, gasping or wheezing  CV: no obvious cyanosis  MS: moves all visible extremities without noticeable abnormality  PSYCH/NEURO: pleasant and cooperative, no obvious depression or anxiety, speech and thought processing grossly intact  ASSESSMENT AND PLAN:  Discussed the following assessment and plan:  Insomnia, unspecified type-disc sleep with psych needs to get more I.e trazadone or other sleep meds likely needed   Abnormal CT of the abdomen EGD/colonscopy due with GI pt is anxious to get scheduled  Chronic fatigue -check fasting labs as ordered check [place] r/o menopause   Elevated lfts resolved per pt she was taking black cohosh and stopped from 05/31/2018 to 07/06/2018 lab draw -rec do not take black cohosh  HM Never gets flu shot  Consider Tdap ? Had 2009/2010 and shingrix in future rec hep B vaccine consider hep A Consider check MMR in future  Pap pt has not had in a while wants to think about it if needed consider OB/GYNpt wants to wait Colonoscopy referred  Still not had consider EGD not sch yet, needs colonoscopy as well  Mammogram7/16/19 normalordered [place]  DEXA7/16/19 normal Will need to check lipid in future Pending home sleep study    I discussed the assessment and treatment plan with the patient. The patient was provided an opportunity to ask questions and all were answered. The patient agreed with the plan and demonstrated an understanding of the instructions.   The patient was advised to call back or seek an in-person evaluation if the symptoms worsen or if the condition fails to improve as anticipated.  Time spent 25  minutes Delorise Jackson, MD

## 2018-07-15 ENCOUNTER — Encounter: Payer: Self-pay | Admitting: Psychiatry

## 2018-07-15 ENCOUNTER — Ambulatory Visit (INDEPENDENT_AMBULATORY_CARE_PROVIDER_SITE_OTHER): Payer: Medicare Other | Admitting: Psychiatry

## 2018-07-15 ENCOUNTER — Other Ambulatory Visit: Payer: Self-pay

## 2018-07-15 DIAGNOSIS — G4700 Insomnia, unspecified: Secondary | ICD-10-CM

## 2018-07-15 DIAGNOSIS — F4310 Post-traumatic stress disorder, unspecified: Secondary | ICD-10-CM

## 2018-07-15 DIAGNOSIS — F1590 Other stimulant use, unspecified, uncomplicated: Secondary | ICD-10-CM

## 2018-07-15 DIAGNOSIS — R41840 Attention and concentration deficit: Secondary | ICD-10-CM

## 2018-07-15 DIAGNOSIS — I25118 Atherosclerotic heart disease of native coronary artery with other forms of angina pectoris: Secondary | ICD-10-CM

## 2018-07-15 DIAGNOSIS — F331 Major depressive disorder, recurrent, moderate: Secondary | ICD-10-CM

## 2018-07-15 DIAGNOSIS — F411 Generalized anxiety disorder: Secondary | ICD-10-CM | POA: Diagnosis not present

## 2018-07-15 MED ORDER — SUVOREXANT 5 MG PO TABS: 5.0000 mg | ORAL_TABLET | Freq: Every day | ORAL | 0 refills | Status: DC

## 2018-07-15 NOTE — Progress Notes (Signed)
Virtual Visit via Video Note  I connected with Cheyenne Gray on 07/15/18 at  1:00 PM EDT by a video enabled telemedicine application and verified that I am speaking with the correct person using two identifiers.   I discussed the limitations of evaluation and management by telemedicine and the availability of in person appointments. The patient expressed understanding and agreed to proceed. I discussed the assessment and treatment plan with the patient. The patient was provided an opportunity to ask questions and all were answered. The patient agreed with the plan and demonstrated an understanding of the instructions.   The patient was advised to call back or seek an in-person evaluation if the symptoms worsen or if the condition fails to improve as anticipated.   BH MD/PA/NP OP Progress Note  07/15/2018 5:56 PM Cheyenne Gray  MRN:  [id number]  Chief Complaint:  Chief Complaint    Follow-up     HPI: Cheyenne Gray is a 53 year old Caucasian female, widowed, on disability, has a history of PTSD, GAD, MDD, insomnia, caffeine use disorder, history of prolonged QT, chronic back pain on methadone, vitamin B12 deficiency, heart failure, iron deficiency, vitamin D deficiency, history of gastric bypass, non-ST elevation MI, hypertension was evaluated by telemedicine today.  Patient today reports that her daughter currently stays with her and is currently being homeschooled.  She reports that helps her since she has been following the social distancing due to the COVID-19 outbreak and is not able to get out so much.  She reports she has been taking walks around the neighborhood.  Patient reports she continues to feel tired and fatigue some days.  She reports she has a lot of medical problems going on as well as is in a lot of pain.  Patient reports she continues to struggle with sleep.  The Ambien 5 mg does not help.  She reports when she tried to increase the dosage it made her groggy in the morning.   Patient does report that she is a light sleeper and has been so since the past several years.  She reports she wants to make sure she knows what is going on around her house at night.  Provided education about working on her sleep hygiene.  She uses an energy pill that she is buying over-the-counter as well as drinks a lot of soda which has caffeine in it.  Discussed with patient about cutting back on the same.  Discussed starting Belsomra.  She agrees with plan.  Patient denies any suicidality, homicidality or perceptual disturbances.  Patient denies any other concerns today. Visit Diagnosis:    ICD-10-CM   1. PTSD (post-traumatic stress disorder) F43.10   2. GAD (generalized anxiety disorder) F41.1   3. MDD (major depressive disorder), recurrent episode, moderate (HCC) F33.1   4. Insomnia, unspecified type G47.00 Suvorexant (BELSOMRA) 5 MG TABS  5. 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 Attention and concentration deficit R41.840     Past Psychiatric History: Reviewed past psychiatric history from my progress note on 11/06/2017.  Past trials of Paxil, Cymbalta, Valium, melatonin, trazodone, Ambien  Past Medical History:  Past Medical History:  Diagnosis Date  . (HFpEF) heart failure with preserved ejection fraction ([place])    a. Echo 2014: EF 65-70%, nl WM, mildly dilated LA, PASP nl; b. 12/2014 Echo: EF 65-70%, no rwma, mod septal hypertrophy w/o LVOT gradient or SAM; c. 07/2017 Echo: EF 55-60%, no rwma, mildly dil RV w/ nl syst fxn. Mildly dil RA. Dilated IVC w/  elevated CVP. Triv post effusion.  Marland Kitchen Anxiety   . Arthritis   . Asthma   . B12 deficiency   . Chronic back pain   . Chronic headaches   . Chronic pain    a. on methadone  . Concussion    hx of 4  . Coronary artery disease, non-occlusive    a. LHC 1/18: proximal to mid LAD 40% stenosed, mid LAD 30% stenosed, mid RCA 20% stenosed, distal RCA 20% stenosed, EF 55-65%, LVEDP normal  . Depression   . DJD (degenerative joint disease),  multiple sites   . Frequent headaches   . Gallstone   . H/O non-ST elevation myocardial infarction (NSTEMI)   . Hand, foot and mouth disease 2016  . History of shingles   . Hypertension   . Iron deficiency anemia   . Long QT interval   . Methadone use ([place])    managed by Dr. Primus Bravo  . Migraine   . Obesity   . Palpitations    a. 24 hour Holter: NSR, sinus brady down to 48, occasional PVCs & couplets, 8 beats NSVT; b. 30 day event monitor 2015: NSR with rare PVC.  Marland Kitchen Psoriasis   . Syncope and collapse   . Vitamin D deficiency     Past Surgical History:  Procedure Laterality Date  . ABDOMINOPLASTY     tummy tuck ? year   . [place] SURGERY  2001  . CARDIAC CATHETERIZATION Left 04/16/2016   Procedure: Left Heart Cath and Coronary Angiography;  Surgeon: [name], MD;  Location: [place] [facility];  Service: Cardiovascular;  Laterality: Left;  . CHOLECYSTECTOMY  2001  . GALLBLADDER SURGERY    . GASTRIC BYPASS  2001  . GASTROPLASTY      Family Psychiatric History: Reviewed family psychiatric history from my progress note on 11/06/2017  Family History:  Family History  Problem Relation Age of Onset  . Heart attack Mother   . Cancer Mother        pancreatitic   . Early death Mother   . Heart attack Father 60       MI  . Early death Father   . Heart disease Father   . Heart attack Brother   . Heart disease Brother   . Arthritis Brother   . Depression Brother   . Diabetes Brother   . Heart attack Maternal Grandmother   . Cancer Maternal Grandmother        pancreatitic   . Heart disease Maternal Grandmother   . Cancer Maternal Uncle        pancreatitic   . Cancer Paternal Grandmother        ? type   . Diabetes Paternal Grandmother   . Cancer Maternal Uncle        pancreatitic   . Breast cancer Maternal Aunt     Social History: Reviewed social history from my progress note on 11/06/2017. Social History   Socioeconomic History  . Marital status: Widowed     Spouse name: Not on file  . Number of children: 1  . Years of education: assoc degree  . Highest education level: Associate degree: occupational, Hotel manager, or vocational program  Occupational History  . Not on file  Social Needs  . Financial resource strain: Very hard  . Food insecurity:    Worry: Never true    Inability: Never true  . Transportation needs:    Medical: No    Non-medical: No  Tobacco Use  .  Smoking status: Never Smoker  . Smokeless tobacco: Never Used  Substance and Sexual Activity  . Alcohol use: No    Alcohol/week: 0.0 standard drinks    Comment: holidays  . Drug use: No  . Sexual activity: Not Currently  Lifestyle  . Physical activity:    Days per week: 0 days    Minutes per session: 0 min  . Stress: Very much  Relationships  . Social connections:    Talks on phone: Once a week    Gets together: Never    Attends religious service: Never    Active member of club or organization: No    Attends meetings of clubs or organizations: Never    Relationship status: Widowed  Other Topics Concern  . 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 Penicillins Hives, Shortness Of Breath and Rash    Has patient had a PCN reaction causing immediate rash, facial/tongue/throat swelling, SOB or lightheadedness with hypotension: Yes Has patient had a PCN reaction causing severe rash involving mucus membranes or skin necrosis: No Has patient had a PCN reaction that required hospitalization No Has patient had a PCN reaction occurring within the last 10 years: No If all of the above answers are "NO", then may proceed with Cephalosporin use.     Metabolic Disorder Labs: No results found for: HGBA1C, MPG No results found for: PROLACTIN Lab  Results  Component Value Date   CHOL 114 10/02/2016   TRIG 59 10/02/2016   HDL 55 10/02/2016   CHOLHDL 2.1 10/02/2016   VLDL 14 01/30/2015   LDLCALC 47 10/02/2016   LDLCALC 94 01/30/2015   Lab Results  Component Value Date   TSH 3.037 07/26/2017    Therapeutic Level Labs: No results found for: LITHIUM No results found for: VALPROATE No components found for:  CBMZ  Current Medications: Current Outpatient Medications  Medication Sig Dispense Refill  . albuterol (PROVENTIL HFA;VENTOLIN HFA) 108 (90 BASE) MCG/ACT inhaler Inhale 2 puffs into the lungs as needed for wheezing.    . busPIRone (BUSPAR) 15 MG tablet Take 1 tablet (15 mg total) by mouth 2 (two) times daily. 180 tablet 0  . Cholecalciferol (VITAMIN D3) 1.25 MG (50000 UT) CAPS TAKE ONE CAPSULE BY MOUTH ONE TIME PER WEEK    . diclofenac sodium (VOLTAREN) 1 % GEL Apply 4 g topically 4 (four) times daily. Prn knees and 2 grams qid prn hands arthritis 100 g 11  . DULoxetine (CYMBALTA) 60 MG capsule TAKE 1 CAPSULE BY MOUTH EVERY DAY 90 capsule 0  . furosemide (LASIX) 40 MG tablet     . gabapentin (NEURONTIN) 400 MG capsule Limit 2 tablets in the a.m. and midday and 3 tablets each evening    Please dispense a three-month supply 630 capsule 0  . isosorbide mononitrate (IMDUR) 60 MG 24 hr tablet Take 1 tablet (60 mg total) by mouth daily. 90 tablet 3  . methadone (DOLOPHINE) 10 MG tablet Limit 1-2 tablets by mouth 2-3 times per day if tolerated 180 tablet 0  . nitroGLYCERIN (NITROSTAT) 0.4 MG SL tablet Place 1 tablet (0.4 mg total) under the tongue every 5 (five) minutes as needed for  chest pain. 25 tablet 3  . rOPINIRole (REQUIP) 0.25 MG tablet Take 2-3 tablets (0.5-0.75 mg total) by mouth at bedtime. 90 tablet 11  . rosuvastatin (CRESTOR) 10 MG tablet TAKE 1 TABLET BY MOUTH EVERY DAY 90 tablet 1  . SUMAtriptan (IMITREX) 100 MG tablet TAKE 1 TABLET BY MOUTH AS NEEDED FOR HEADACHE. MAY REPEAT IN 2 HRS IF NEEDED. MAX 200MG/DAY  5  .  Suvorexant (BELSOMRA) 5 MG TABS Take 5 mg by mouth at bedtime. 15 tablet 0  . tiZANidine (ZANAFLEX) 2 MG tablet      Current Facility-Administered Medications  Medication Dose Route Frequency Provider Last Rate Last Dose  . bupivacaine (PF) (MARCAINE) 0.25 % injection 30 mL  30 mL Other Once Mohammed Kindle, MD      . bupivacaine (PF) (MARCAINE) 0.25 % injection 30 mL  30 mL Other Once Mohammed Kindle, MD      . fentaNYL (SUBLIMAZE) injection 100 mcg  100 mcg Intravenous Once Mohammed Kindle, MD      . fentaNYL (SUBLIMAZE) injection 100 mcg  100 mcg Intravenous Once Mohammed Kindle, MD      . lactated ringers infusion 1,000 mL  1,000 mL Intravenous Continuous Mohammed Kindle, MD      . lactated ringers infusion 1,000 mL  1,000 mL Intravenous Continuous Mohammed Kindle, MD      . lactated ringers infusion 1,000 mL  1,000 mL Intravenous Continuous Mohammed Kindle, MD 125 mL/hr at 08/06/15 1002 1,000 mL at 08/06/15 1002  . lidocaine (PF) (XYLOCAINE) 1 % injection 10 mL  10 mL Subcutaneous Once Mohammed Kindle, MD      . midazolam (VERSED) 5 MG/5ML injection 5 mg  5 mg Intravenous Once Mohammed Kindle, MD      . orphenadrine (NORFLEX) injection 60 mg  60 mg Intramuscular Once Mohammed Kindle, MD      . orphenadrine (NORFLEX) injection 60 mg  60 mg Intramuscular Once Mohammed Kindle, MD      . triamcinolone acetonide (KENALOG-40) injection 40 mg  40 mg Other Once Mohammed Kindle, MD      . triamcinolone acetonide (KENALOG-40) injection 40 mg  40 mg Other Once Mohammed Kindle, MD         Musculoskeletal: Strength & Muscle Tone: [place]: normal Patient leans: N/A  Psychiatric Specialty Exam: Review of Systems  Musculoskeletal: Positive for back pain.  Psychiatric/Behavioral: Positive for depression. The patient has insomnia.   All other systems reviewed and are negative.   Last menstrual period 02/18/2016.There is no height or weight on file to calculate BMI.  General Appearance: Casual  Eye  Contact:  Good  Speech:  Clear and Coherent  Volume:  Normal  Mood:  Depressed  Affect:  Congruent  Thought Process:  Goal Directed and Descriptions of Associations: Intact  Orientation:  Full (Time, Place, and Person)  Thought Content: Logical   Suicidal Thoughts:  No  Homicidal Thoughts:  No  Memory:  Immediate;   Fair Recent;   Fair Remote;   Fair  Judgement:  Fair  Insight:  Fair  Psychomotor Activity:  Normal  Concentration:  Concentration: Fair and Attention Span: Fair  Recall:  [employer] of Knowledge: Fair  Language: Fair  Akathisia:  No  Handed:  Right  AIMS (if indicated): denies tremors, rigidity  Assets:  Communication Skills Desire for Improvement Social Support  ADL's:  Intact  Cognition: WNL  Sleep:  Poor   Screenings: GAD-7     Office Visit from  09/21/2015 in [place]  Total GAD-7 Score  4    Mini-Mental     Office Visit from 10/30/2017 in [facility]  Total Score (max 30 points )  27    PHQ2-9     Office Visit from 08/18/2017 in [facility] Office Visit from 11/15/2015 in [place] Office Visit from 10/18/2015 in [place] Office Visit from 09/21/2015 in [place] Office Visit from 07/25/2015 in [place]  PHQ-2 Total Score  0  0  0  0  0       Assessment and Plan: Lorely is a 53 year old Caucasian female, widowed, has a history of PTSD, anxiety, depression, multiple medical problems including coronary artery disease, history of NSTEMI, long QT per history, gastric bypass, vitamin D and vitamin B12 deficiency, chronic pain on methadone, was evaluated by telemedicine today.  Patient is biologically predisposed given her history of multiple medical problems, trauma as well as family history of mental  health problems.  She continues to struggle with sleep problems as well as fatigue.  Plan as noted below.  Plan PTSD--improving Cymbalta 60 mg p.o. daily.  Patient is on reduced dosage due to nausea on higher dosage. BuSpar 15 mg p.o. twice daily Continue psychotherapy with therapist here in clinic.  For MDD- improving Cymbalta as prescribed Continue CBT  For GAD-improving BuSpar and Cymbalta as prescribed  For insomnia- unstable Discontinue Ambien Start Belsomra 5 mg p.o. nightly Discussed sleep hygiene techniques including avoiding caffeine.  Caffeine use disorder-some progress Patient is trying to cut down.  For memory problems-we will continue to monitor closely.  MMSE done on 11/06/2017-30 out of 30.  She will continue B12 and vitamin D management per PMD.  Follow-up in clinic in 2 weeks or sooner if needed.  I have spent atleast 25 minutes non face to face with patient today. More than 50 % of the time was spent for psychoeducation and supportive psychotherapy and care coordination.  This note was generated in part or whole with voice recognition software. Voice recognition is usually quite accurate but there are transcription errors that can and very often do occur. I apologize for any typographical errors that were not detected and corrected.      Ursula Alert, MD 07/15/2018, 5:56 PM

## 2018-07-16 ENCOUNTER — Encounter: Admission: RE | Payer: Self-pay | Source: Home / Self Care

## 2018-07-16 ENCOUNTER — Ambulatory Visit: Admission: RE | Admit: 2018-07-16 | Payer: Medicare Other | Source: Home / Self Care | Admitting: Gastroenterology

## 2018-07-16 SURGERY — COLONOSCOPY WITH PROPOFOL
Anesthesia: General

## 2018-07-27 ENCOUNTER — Other Ambulatory Visit: Payer: Medicare Other

## 2018-07-27 ENCOUNTER — Encounter: Payer: Self-pay | Admitting: Internal Medicine

## 2018-07-29 ENCOUNTER — Other Ambulatory Visit: Payer: Medicare Other

## 2018-07-30 ENCOUNTER — Encounter: Payer: Self-pay | Admitting: Psychiatry

## 2018-07-30 ENCOUNTER — Ambulatory Visit (INDEPENDENT_AMBULATORY_CARE_PROVIDER_SITE_OTHER): Payer: Medicare Other | Admitting: Psychiatry

## 2018-07-30 ENCOUNTER — Other Ambulatory Visit: Payer: Self-pay

## 2018-07-30 DIAGNOSIS — F331 Major depressive disorder, recurrent, moderate: Secondary | ICD-10-CM | POA: Diagnosis not present

## 2018-07-30 DIAGNOSIS — F411 Generalized anxiety disorder: Secondary | ICD-10-CM

## 2018-07-30 DIAGNOSIS — I2510 Atherosclerotic heart disease of native coronary artery without angina pectoris: Secondary | ICD-10-CM

## 2018-07-30 DIAGNOSIS — G4700 Insomnia, unspecified: Secondary | ICD-10-CM | POA: Diagnosis not present

## 2018-07-30 DIAGNOSIS — F4310 Post-traumatic stress disorder, unspecified: Secondary | ICD-10-CM

## 2018-07-30 DIAGNOSIS — F1590 Other stimulant use, unspecified, uncomplicated: Secondary | ICD-10-CM

## 2018-07-30 MED ORDER — BUSPIRONE HCL 15 MG PO TABS: 15.0000 mg | ORAL_TABLET | Freq: Two times a day (BID) | ORAL | 0 refills | Status: DC

## 2018-07-30 MED ORDER — DULOXETINE HCL 60 MG PO CPEP: ORAL_CAPSULE | ORAL | 0 refills | Status: DC

## 2018-07-30 MED ORDER — SUVOREXANT 5 MG PO TABS: 5.0000 mg | ORAL_TABLET | Freq: Every day | ORAL | 0 refills | Status: DC

## 2018-07-30 NOTE — Progress Notes (Signed)
Virtual Visit via Video Note  I connected with Cheyenne Gray on 07/30/18 at 11:00 AM EDT by a video enabled telemedicine application and verified that I am speaking with the correct person using two identifiers.   I discussed the limitations of evaluation and management by telemedicine and the availability of in person appointments. The patient expressed understanding and agreed to proceed.    I discussed the assessment and treatment plan with the patient. The patient was provided an opportunity to ask questions and all were answered. The patient agreed with the plan and demonstrated an understanding of the instructions.   The patient was advised to call back or seek an in-person evaluation if the symptoms worsen or if the condition fails to improve as anticipated.  [place] MD OP Progress Note  07/30/2018 11:02 AM [name]  MRN:  [id number]  Chief Complaint:  Chief Complaint    Follow-up     HPI: Cheyenne Gray is a 53 year old Caucasian female, widowed, on disability, has a history of PTSD, GAD, MDD, insomnia, caffeine use disorder, history of prolonged QT, chronic back pain on methadone, vitamin B12 deficiency, heart failure, iron deficiency, vitamin D deficiency, history of gastric bypass, non-ST elevation MI, hypertension was evaluated by telemedicine today.  Patient today reports she has started sleeping better with the Belsomra.  The medication does make her groggy when she wakes up in the morning she hence is trying to cut it into half to see if that will help.  Discussed with patient that the grogginess could be a combination of Belsomra with all her other medications.  Also discussed with her to give it more time and she may get used to it.  She otherwise is doing okay so far on the other medications.  She reports her mood as fair.  Patient denies any suicidality, homicidality or perceptual disturbances.  Patient continues to have good support system from her daughter.  Patient  denies any other concerns today.   Visit Diagnosis:    ICD-10-CM   1. PTSD (post-traumatic stress disorder) F43.10 busPIRone (BUSPAR) 15 MG tablet  2. GAD (generalized anxiety disorder) F41.1 busPIRone (BUSPAR) 15 MG tablet    DULoxetine (CYMBALTA) 60 MG capsule  3. MDD (major depressive disorder), recurrent episode, moderate (HCC) F33.1   4. Insomnia, unspecified type G47.00 Suvorexant (BELSOMRA) 5 MG TABS  5. 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  Past trials of Paxil, Cymbalta, Valium, melatonin, trazodone, Ambien  Past Medical History:  Past Medical History:  Diagnosis Date  . (HFpEF) heart failure with preserved ejection fraction ([place])    a. Echo 2014: EF 65-70%, nl WM, mildly dilated LA, PASP nl; b. 12/2014 Echo: EF 65-70%, no rwma, mod septal hypertrophy w/o LVOT gradient or SAM; c. 07/2017 Echo: EF 55-60%, no rwma, mildly dil RV w/ nl syst fxn. Mildly dil RA. Dilated IVC w/ elevated CVP. Triv post effusion.  Marland Kitchen Anxiety   . Arthritis   . Asthma   . B12 deficiency   . Chronic back pain   . Chronic headaches   . Chronic pain    a. on methadone  . Concussion    hx of 4  . Coronary artery disease, non-occlusive    a. LHC 1/18: proximal to mid LAD 40% stenosed, mid LAD 30% stenosed, mid RCA 20% stenosed, distal RCA 20% stenosed, EF 55-65%, LVEDP normal  . Depression   . DJD (  degenerative joint disease), multiple sites   . Frequent headaches   . Gallstone   . H/O non-ST elevation myocardial infarction (NSTEMI)   . Hand, foot and mouth disease 2016  . History of shingles   . Hypertension   . Iron deficiency anemia   . Long QT interval   . Methadone use ([place])    managed by Dr. Primus Bravo  . Migraine   . Obesity   . Palpitations    a. 24 hour Holter: NSR, sinus brady down to 48, occasional PVCs & couplets, 8 beats NSVT; b. 30 day event monitor 2015: NSR with rare PVC.  Marland Kitchen Psoriasis    . Syncope and collapse   . Vitamin D deficiency     Past Surgical History:  Procedure Laterality Date  . ABDOMINOPLASTY     tummy tuck ? year   . [place] SURGERY  2001  . CARDIAC CATHETERIZATION Left 04/16/2016   Procedure: Left Heart Cath and Coronary Angiography;  Surgeon: [name], MD;  Location: [facility];  Service: Cardiovascular;  Laterality: Left;  . CHOLECYSTECTOMY  2001  . GALLBLADDER SURGERY    . GASTRIC BYPASS  2001  . GASTROPLASTY      Family Psychiatric History: I have reviewed family psychiatric history from my progress note on 11/06/2017  Family History:  Family History  Problem Relation Age of Onset  . Heart attack Mother   . Cancer Mother        pancreatitic   . Early death Mother   . Heart attack Father 60       MI  . Early death Father   . Heart disease Father   . Heart attack Brother   . Heart disease Brother   . Arthritis Brother   . Depression Brother   . Diabetes Brother   . Heart attack Maternal Grandmother   . Cancer Maternal Grandmother        pancreatitic   . Heart disease Maternal Grandmother   . Cancer Maternal Uncle        pancreatitic   . Cancer Paternal Grandmother        ? type   . Diabetes Paternal Grandmother   . Cancer Maternal Uncle        pancreatitic   . Breast cancer Maternal Aunt     Social History: Reviewed social history from my progress note on 11/06/2017. Social History   Socioeconomic History  . Marital status: Widowed    Spouse name: Not on file  . Number of children: 1  . Years of education: assoc degree  . Highest education level: Associate degree: occupational, Hotel manager, or vocational program  Occupational History  . Not on file  Social Needs  . Financial resource strain: Very hard  . Food insecurity:    Worry: Never true    Inability: Never true  . Transportation needs:    Medical: No    Non-medical: No  Tobacco Use  . Smoking status: Never Smoker  . Smokeless tobacco: Never  Used  Substance and Sexual Activity  . Alcohol use: No    Alcohol/week: 0.0 standard drinks    Comment: holidays  . Drug use: No  . Sexual activity: Not Currently  Lifestyle  . Physical activity:    Days per week: 0 days    Minutes per session: 0 min  . Stress: Very much  Relationships  . Social connections:    Talks on phone: Once a week    Gets together:  Never    Attends religious service: Never    Active member of club or organization: No    Attends meetings of clubs or organizations: Never    Relationship status: Widowed  Other Topics Concern  . 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 Penicillins Hives, Shortness Of Breath and Rash    Has patient had a PCN reaction causing immediate rash, facial/tongue/throat swelling, SOB or lightheadedness with hypotension: Yes Has patient had a PCN reaction causing severe rash involving mucus membranes or skin necrosis: No Has patient had a PCN reaction that required hospitalization No Has patient had a PCN reaction occurring within the last 10 years: No If all of the above answers are "NO", then may proceed with Cephalosporin use.     Metabolic Disorder Labs: No results found for: HGBA1C, MPG No results found for: PROLACTIN Lab Results  Component Value Date   CHOL 114 10/02/2016   TRIG 59 10/02/2016   HDL 55 10/02/2016   CHOLHDL 2.1 10/02/2016   VLDL 14 01/30/2015   LDLCALC 47 10/02/2016   LDLCALC 94 01/30/2015   Lab Results  Component Value Date   TSH 3.037 07/26/2017    Therapeutic Level Labs: No results found for: LITHIUM No results found for: VALPROATE No components found for:  CBMZ  Current Medications: Current Outpatient Medications  Medication Sig Dispense Refill  . albuterol  (PROVENTIL HFA;VENTOLIN HFA) 108 (90 BASE) MCG/ACT inhaler Inhale 2 puffs into the lungs as needed for wheezing.    . busPIRone (BUSPAR) 15 MG tablet Take 1 tablet (15 mg total) by mouth 2 (two) times daily. 180 tablet 0  . Cholecalciferol (VITAMIN D3) 1.25 MG (50000 UT) CAPS TAKE ONE CAPSULE BY MOUTH ONE TIME PER WEEK    . diclofenac sodium (VOLTAREN) 1 % GEL Apply 4 g topically 4 (four) times daily. Prn knees and 2 grams qid prn hands arthritis 100 g 11  . DULoxetine (CYMBALTA) 60 MG capsule TAKE 1 CAPSULE BY MOUTH EVERY DAY 90 capsule 0  . furosemide (LASIX) 40 MG tablet     . gabapentin (NEURONTIN) 400 MG capsule Limit 2 tablets in the a.m. and midday and 3 tablets each evening    Please dispense a three-month supply 630 capsule 0  . isosorbide mononitrate (IMDUR) 60 MG 24 hr tablet Take 1 tablet (60 mg total) by mouth daily. 90 tablet 3  . methadone (DOLOPHINE) 10 MG tablet Limit 1-2 tablets by mouth 2-3 times per day if tolerated 180 tablet 0  . nitroGLYCERIN (NITROSTAT) 0.4 MG SL tablet Place 1 tablet (0.4 mg total) under the tongue every 5 (five) minutes as needed for chest pain. 25 tablet 3  . rOPINIRole (REQUIP) 0.25 MG tablet Take 2-3 tablets (0.5-0.75 mg total) by mouth at bedtime. 90 tablet 11  . rosuvastatin (CRESTOR) 10 MG tablet TAKE 1 TABLET BY MOUTH EVERY DAY 90 tablet 1  . SUMAtriptan (IMITREX) 100 MG tablet TAKE 1 TABLET BY MOUTH AS NEEDED FOR HEADACHE. MAY REPEAT IN 2 HRS IF NEEDED. MAX 200MG/DAY  5  . Suvorexant (BELSOMRA) 5 MG TABS Take 5 mg by mouth at bedtime. 30 tablet 0  . tiZANidine (ZANAFLEX)  2 MG tablet      Current Facility-Administered Medications  Medication Dose Route Frequency Provider Last Rate Last Dose  . bupivacaine (PF) (MARCAINE) 0.25 % injection 30 mL  30 mL Other Once Mohammed Kindle, MD      . bupivacaine (PF) (MARCAINE) 0.25 % injection 30 mL  30 mL Other Once Mohammed Kindle, MD      . fentaNYL (SUBLIMAZE) injection 100 mcg  100 mcg Intravenous Once  Mohammed Kindle, MD      . fentaNYL (SUBLIMAZE) injection 100 mcg  100 mcg Intravenous Once Mohammed Kindle, MD      . lactated ringers infusion 1,000 mL  1,000 mL Intravenous Continuous Mohammed Kindle, MD      . lactated ringers infusion 1,000 mL  1,000 mL Intravenous Continuous Mohammed Kindle, MD      . lactated ringers infusion 1,000 mL  1,000 mL Intravenous Continuous Mohammed Kindle, MD 125 mL/hr at 08/06/15 1002 1,000 mL at 08/06/15 1002  . lidocaine (PF) (XYLOCAINE) 1 % injection 10 mL  10 mL Subcutaneous Once Mohammed Kindle, MD      . midazolam (VERSED) 5 MG/5ML injection 5 mg  5 mg Intravenous Once Mohammed Kindle, MD      . orphenadrine (NORFLEX) injection 60 mg  60 mg Intramuscular Once Mohammed Kindle, MD      . orphenadrine (NORFLEX) injection 60 mg  60 mg Intramuscular Once Mohammed Kindle, MD      . triamcinolone acetonide (KENALOG-40) injection 40 mg  40 mg Other Once Mohammed Kindle, MD      . triamcinolone acetonide (KENALOG-40) injection 40 mg  40 mg Other Once Mohammed Kindle, MD         Musculoskeletal: Strength & Muscle Tone: [place]: UTA Patient leans: N/A  Psychiatric Specialty Exam: Review of Systems  Psychiatric/Behavioral: The patient is nervous/anxious.   All other systems reviewed and are negative.   Last menstrual period 02/18/2016.There is no height or weight on file to calculate BMI.  General Appearance: Casual  Eye Contact:  Fair  Speech:  Clear and Coherent  Volume:  Normal  Mood:  Anxious  Affect:  Congruent  Thought Process:  Goal Directed and Descriptions of Associations: Intact  Orientation:  Full (Time, Place, and Person)  Thought Content: Logical   Suicidal Thoughts:  No  Homicidal Thoughts:  No  Memory:  Immediate;   Fair Recent;   Fair Remote;   Fair  Judgement:  Fair  Insight:  Fair  Psychomotor Activity:  Normal  Concentration:  Concentration: Fair and Attention Span: Fair  Recall:  [employer] of Knowledge: Fair  Language:  Fair  Akathisia:  No  Handed:  Right  AIMS (if indicated): Denies tremors, rigidity,stiffness  Assets:  Communication Skills Desire for Improvement Social Support  ADL's:  Intact  Cognition: WNL  Sleep:  Fair   Screenings: GAD-7     Office Visit from 09/21/2015 in [facility]  Total GAD-7 Score  4    Mini-Mental     Office Visit from 10/30/2017 in [facility]  Total Score (max 30 points )  27    PHQ2-9     Office Visit from 08/18/2017 in [facility] Office Visit from 11/15/2015 in [facility] Office Visit from 10/18/2015 in [facility] Office Visit from 09/21/2015 in [facility] Office Visit from 07/25/2015 in [facility]  PHQ-2 Total Score  0  0  0  0  0       Assessment and Plan: Stellar is a 53 year old Caucasian female, widowed, has a history of PTSD, anxiety, depression, multiple medical problems including coronary artery disease, history of NSTEMI, long QT per history, gastric bypass, vitamin D and vitamin B12 deficiency, chronic pain on methadone was evaluated by telemedicine today.  Patient is biologically predisposed given her history of multiple medical problems, trauma as well as family history of mental health problems.  She is currently making progress with regards to her anxiety as well as sleep.  Plan as noted below.  Plan PTSD-improving Cymbalta 60 mg p.o. daily.  Patient is on reduced dosage due to nausea on higher dosage. BuSpar 15 mg p.o. twice daily. Continue psychotherapy with therapist here in clinic.  MDD-improving Cymbalta as prescribed Continue CBT  For GAD-improving BuSpar and Cymbalta as prescribed  For insomnia-improving Belsomra 5 mg p.o. nightly I have reviewed [place] controlled substance database.  For  caffeine use disorder-some progress Patient is cutting back.  Follow-up in clinic in 1 to 2 months or sooner if needed.  I have spent atleast 15 minutes non face to face with patient today. More than 50 % of the time was spent for psychoeducation and supportive psychotherapy and care coordination.  This note was generated in part or whole with voice recognition software. Voice recognition is usually quite accurate but there are transcription errors that can and very often do occur. I apologize for any typographical errors that were not detected and corrected.        Ursula Alert, MD 07/30/2018, 11:02 AM

## 2018-08-03 ENCOUNTER — Encounter: Payer: Self-pay | Admitting: Internal Medicine

## 2018-08-17 ENCOUNTER — Other Ambulatory Visit: Payer: Self-pay

## 2018-08-17 DIAGNOSIS — D508 Other iron deficiency anemias: Secondary | ICD-10-CM

## 2018-08-17 DIAGNOSIS — Z1211 Encounter for screening for malignant neoplasm of colon: Secondary | ICD-10-CM

## 2018-08-17 MED ORDER — NA SULFATE-K SULFATE-MG SULF 17.5-3.13-1.6 GM/177ML PO SOLN: 1.0000 | Freq: Once | ORAL | 0 refills | Status: AC

## 2018-09-01 ENCOUNTER — Encounter: Payer: Self-pay | Admitting: Anesthesiology

## 2018-09-01 ENCOUNTER — Encounter: Payer: Self-pay | Admitting: *Deleted

## 2018-09-01 ENCOUNTER — Other Ambulatory Visit (INDEPENDENT_AMBULATORY_CARE_PROVIDER_SITE_OTHER): Payer: Medicare Other

## 2018-09-01 ENCOUNTER — Other Ambulatory Visit: Payer: Self-pay

## 2018-09-01 DIAGNOSIS — Z13220 Encounter for screening for lipoid disorders: Secondary | ICD-10-CM | POA: Diagnosis not present

## 2018-09-01 DIAGNOSIS — Z780 Asymptomatic menopausal state: Secondary | ICD-10-CM | POA: Diagnosis not present

## 2018-09-01 DIAGNOSIS — E559 Vitamin D deficiency, unspecified: Secondary | ICD-10-CM | POA: Diagnosis not present

## 2018-09-01 DIAGNOSIS — R5382 Chronic fatigue, unspecified: Secondary | ICD-10-CM | POA: Diagnosis not present

## 2018-09-01 DIAGNOSIS — R739 Hyperglycemia, unspecified: Secondary | ICD-10-CM | POA: Diagnosis not present

## 2018-09-01 DIAGNOSIS — I2510 Atherosclerotic heart disease of native coronary artery without angina pectoris: Secondary | ICD-10-CM

## 2018-09-01 LAB — LIPID PANEL
Cholesterol: 176 mg/dL (ref 0–200)
HDL: 57.9 mg/dL (ref >39.00)
LDL Cholesterol: 95 mg/dL (ref 0–99)
NonHDL: 118.05
Total CHOL/HDL Ratio: 3
Triglycerides: 113 mg/dL (ref 0.0–149.0)
VLDL: 22.6 mg/dL (ref 0.0–40.0)

## 2018-09-01 LAB — BASIC METABOLIC PANEL
BUN: 8 mg/dL (ref 6–23)
CO2: 32 mEq/L (ref 19–32)
Calcium: 8.9 mg/dL (ref 8.4–10.5)
Chloride: 104 mEq/L (ref 96–112)
Creatinine, Ser: 0.87 mg/dL (ref 0.40–1.20)
GFR: 68.64 mL/min (ref >60.00)
Glucose, Bld: 81 mg/dL (ref 70–99)
Potassium: 4.1 mEq/L (ref 3.5–5.1)
Sodium: 141 mEq/L (ref 135–145)

## 2018-09-01 LAB — T4, FREE: Free T4: 0.78 ng/dL (ref 0.60–1.60)

## 2018-09-01 LAB — VITAMIN D 25 HYDROXY (VIT D DEFICIENCY, FRACTURES): VITD: 43.38 ng/mL (ref 30.00–100.00)

## 2018-09-01 LAB — HEMOGLOBIN A1C: Hgb A1c MFr Bld: 5.3 % (ref 4.6–6.5)

## 2018-09-01 LAB — FOLLICLE STIMULATING HORMONE: FSH: 29.1 m[IU]/mL

## 2018-09-01 LAB — TSH: TSH: 2.63 u[IU]/mL (ref 0.35–4.50)

## 2018-09-03 ENCOUNTER — Other Ambulatory Visit: Payer: Self-pay

## 2018-09-03 ENCOUNTER — Other Ambulatory Visit
Admission: RE | Admit: 2018-09-03 | Discharge: 2018-09-03 | Disposition: A | Discharge status: Home / Self Care | Payer: Medicare Other | Source: Ambulatory Visit | Attending: Gastroenterology | Admitting: Gastroenterology

## 2018-09-03 DIAGNOSIS — Z1159 Encounter for screening for other viral diseases: Secondary | ICD-10-CM | POA: Insufficient documentation

## 2018-09-04 LAB — NOVEL CORONAVIRUS, NAA (HOSP ORDER, SEND-OUT TO REF LAB; TAT 18-24 HRS): SARS-CoV-2, NAA: NOT DETECTED

## 2018-09-06 ENCOUNTER — Telehealth: Payer: Self-pay | Admitting: Gastroenterology

## 2018-09-06 NOTE — Discharge Instructions (Signed)
General Anesthesia, Adult, Care After  This sheet gives you information about how to care for yourself after your procedure. Your health care provider may also give you more specific instructions. If you have problems or questions, contact your health care provider.  What can I expect after the procedure?  After the procedure, the following side effects are common:  Pain or discomfort at the IV site.  Nausea.  Vomiting.  Sore throat.  Trouble concentrating.  Feeling cold or chills.  Weak or tired.  Sleepiness and fatigue.  Soreness and body aches. These side effects can affect parts of the body that were not involved in surgery.  Follow these instructions at home:    For at least 24 hours after the procedure:  Have a responsible adult stay with you. It is important to have someone help care for you until you are awake and alert.  Rest as needed.  Do not:  Participate in activities in which you could fall or become injured.  Drive.  Use heavy machinery.  Drink alcohol.  Take sleeping pills or medicines that cause drowsiness.  Make important decisions or sign legal documents.  Take care of children on your own.  Eating and drinking  Follow any instructions from your health care provider about eating or drinking restrictions.  When you feel hungry, start by eating small amounts of foods that are soft and easy to digest (bland), such as toast. Gradually return to your regular diet.  Drink enough fluid to keep your urine pale yellow.  If you vomit, rehydrate by drinking water, juice, or clear broth.  General instructions  If you have sleep apnea, surgery and certain medicines can increase your risk for breathing problems. Follow instructions from your health care provider about wearing your sleep device:  Anytime you are sleeping, including during daytime naps.  While taking prescription pain medicines, sleeping medicines, or medicines that make you drowsy.  Return to your normal activities as told by your health care  provider. Ask your health care provider what activities are safe for you.  Take over-the-counter and prescription medicines only as told by your health care provider.  If you smoke, do not smoke without supervision.  Keep all follow-up visits as told by your health care provider. This is important.  Contact a health care provider if:  You have nausea or vomiting that does not get better with medicine.  You cannot eat or drink without vomiting.  You have pain that does not get better with medicine.  You are unable to pass urine.  You develop a skin rash.  You have a fever.  You have redness around your IV site that gets worse.  Get help right away if:  You have difficulty breathing.  You have chest pain.  You have blood in your urine or stool, or you vomit blood.  Summary  After the procedure, it is common to have a sore throat or nausea. It is also common to feel tired.  Have a responsible adult stay with you for the first 24 hours after general anesthesia. It is important to have someone help care for you until you are awake and alert.  When you feel hungry, start by eating small amounts of foods that are soft and easy to digest (bland), such as toast. Gradually return to your regular diet.  Drink enough fluid to keep your urine pale yellow.  Return to your normal activities as told by your health care provider. Ask your health care   provider what activities are safe for you.  This information is not intended to replace advice given to you by your health care provider. Make sure you discuss any questions you have with your health care provider.  Document Released: 06/09/2000 Document Revised: 10/17/2016 Document Reviewed: 10/17/2016  Elsevier Interactive Patient Education  2019 [employer]

## 2018-09-06 NOTE — Telephone Encounter (Signed)
[facility] notified Cheyenne Gray) that pt has canceled her procedure (colonoscopy) for tomorrow (6/23) due to not feeling well. Will make note in referral.

## 2018-09-06 NOTE — Telephone Encounter (Signed)
Patient called to cancel colonoscopy because she is not feeling well for 09-07-18.

## 2018-09-07 ENCOUNTER — Ambulatory Visit: Admission: RE | Admit: 2018-09-07 | Payer: Medicare Other | Source: Home / Self Care | Admitting: Gastroenterology

## 2018-09-07 HISTORY — DX: Presence of dental prosthetic device (complete) (partial): Z97.2

## 2018-09-07 HISTORY — DX: Motion sickness, initial encounter: T75.3XXA

## 2018-09-07 SURGERY — COLONOSCOPY WITH PROPOFOL
Anesthesia: Choice

## 2018-09-09 ENCOUNTER — Ambulatory Visit (INDEPENDENT_AMBULATORY_CARE_PROVIDER_SITE_OTHER): Payer: Medicare Other | Admitting: Internal Medicine

## 2018-09-09 ENCOUNTER — Other Ambulatory Visit: Payer: Self-pay

## 2018-09-09 ENCOUNTER — Ambulatory Visit: Payer: Medicare Other | Admitting: Gastroenterology

## 2018-09-09 DIAGNOSIS — L987 Excessive and redundant skin and subcutaneous tissue: Secondary | ICD-10-CM | POA: Diagnosis not present

## 2018-09-09 DIAGNOSIS — R413 Other amnesia: Secondary | ICD-10-CM | POA: Diagnosis not present

## 2018-09-09 DIAGNOSIS — L304 Erythema intertrigo: Secondary | ICD-10-CM | POA: Diagnosis not present

## 2018-09-09 DIAGNOSIS — N951 Menopausal and female climacteric states: Secondary | ICD-10-CM | POA: Diagnosis not present

## 2018-09-09 NOTE — Progress Notes (Signed)
Virtual Visit via Video Note  I connected with Cheyenne Gray  on 09/09/18 at  3:30 PM EDT by a video enabled telemedicine application and verified that I am speaking with the correct person using two identifiers.  Location patient: home Location provider:work  Persons participating in the virtual visit: patient, provider, daughter Kira  I discussed the limitations of evaluation and management by telemedicine and the availability of in person appointments. The patient expressed understanding and agreed to proceed.   HPI: 1. C/o short term memory loss has disc'ed with her psychiatrist in the past and thought it was 2/2 to stress, medications, mood. Also MRI reviewed from 2017 negative  2. C/o excess skin to upper thighs with walking thighs rubbing and with sweating she gets a yeast rash in the skin folds of the upper thighs. She has tried otc powders to keep her dry but daughter reports self esteem low due to this and she wants to see someone about this   ROS: See pertinent positives and negatives per HPI.  Past Medical History:  Diagnosis Date  . (HFpEF) heart failure with preserved ejection fraction (HCC)    a. Echo 2014: EF 65-70%, nl WM, mildly dilated LA, PASP nl; b. 12/2014 Echo: EF 65-70%, no rwma, mod septal hypertrophy w/o LVOT gradient or SAM; c. 07/2017 Echo: EF 55-60%, no rwma, mildly dil RV w/ nl syst fxn. Mildly dil RA. Dilated IVC w/ elevated CVP. Triv post effusion.  . Anxiety   . Arthritis   . Asthma   . B12 deficiency   . Chronic back pain   . Chronic headaches   . Chronic pain    a. on methadone  . Concussion    hx of 4  . Coronary artery disease, non-occlusive    a. LHC 1/18: proximal to mid LAD 40% stenosed, mid LAD 30% stenosed, mid RCA 20% stenosed, distal RCA 20% stenosed, EF 55-65%, LVEDP normal  . Depression   . DJD (degenerative joint disease), multiple sites   . Frequent headaches   . Gallstone   . H/O non-ST elevation myocardial infarction (NSTEMI)   .  Hand, foot and mouth disease 2016  . History of shingles   . Hypertension   . Iron deficiency anemia   . Long QT interval   . Methadone use (HCC)    managed by Dr. Crisp  . Migraine    1x/mo  . Motion sickness    cars  . Obesity   . Palpitations    a. 24 hour Holter: NSR, sinus brady down to 48, occasional PVCs & couplets, 8 beats NSVT; b. 30 day event monitor 2015: NSR with rare PVC.  . Psoriasis   . Syncope and collapse   . Vitamin D deficiency   . Wears dentures    full upper and lower    Past Surgical History:  Procedure Laterality Date  . ABDOMINOPLASTY     tummy tuck ? year   . BARIATRIC SURGERY  2001  . CARDIAC CATHETERIZATION Left 04/16/2016   Procedure: Left Heart Cath and Coronary Angiography;  Surgeon: Timothy J Gollan, MD;  Location: [facility];  Service: Cardiovascular;  Laterality: Left;  . CHOLECYSTECTOMY  2001  . GALLBLADDER SURGERY    . GASTRIC BYPASS  2001  . GASTROPLASTY      Family History  Problem Relation Age of Onset  . Heart attack Mother   . Cancer Mother        pancreatitic   . Early death   Mother   . Heart attack Father 63       MI  . Early death Father   . Heart disease Father   . Heart attack Brother   . Heart disease Brother   . Arthritis Brother   . Depression Brother   . Diabetes Brother   . Heart attack Maternal Grandmother   . Cancer Maternal Grandmother        pancreatitic   . Heart disease Maternal Grandmother   . Cancer Maternal Uncle        pancreatitic   . Cancer Paternal Grandmother        ? type   . Diabetes Paternal Grandmother   . Cancer Maternal Uncle        pancreatitic   . 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  albuterol (PROVENTIL HFA;VENTOLIN HFA) 108 (90 BASE) MCG/ACT inhaler, Inhale 2 puffs into the lungs as needed for wheezing., Disp: , Rfl:  .  BLACK  COHOSH PO, Take by mouth daily., Disp: , Rfl:  .  busPIRone (BUSPAR) 15 MG tablet, Take 1 tablet (15 mg total) by mouth 2 (two) times daily., Disp: 180 tablet, Rfl: 0 .  Cholecalciferol (VITAMIN D3) 125 MCG (5000 UT) TABS, Take by mouth daily., Disp: , Rfl:  .  diclofenac sodium (VOLTAREN) 1 % GEL, Apply 4 g topically 4 (four) times daily. Prn knees and 2 grams qid prn hands arthritis, Disp: 100 g, Rfl: 11 .  DULoxetine (CYMBALTA) 60 MG capsule, TAKE 1 CAPSULE BY MOUTH EVERY DAY, Disp: 90 capsule, Rfl: 0 .  furosemide (LASIX) 40 MG tablet, , Disp: , Rfl:  .  gabapentin (NEURONTIN) 400 MG capsule, Limit 2 tablets in the a.m. and midday and 3 tablets each evening    Please dispense a three-month supply, Disp: 630 capsule, Rfl: 0 .  methadone (DOLOPHINE) 10 MG tablet, Limit 1-2 tablets by mouth 2-3 times per day if tolerated, Disp: 180 tablet, Rfl: 0 .  nitroGLYCERIN (NITROSTAT) 0.4 MG SL tablet, Place 1 tablet (0.4 mg total) under the tongue every 5 (five) minutes as needed for chest pain., Disp: 25 tablet, Rfl: 3 .  rOPINIRole (REQUIP) 0.25 MG tablet, Take 2-3 tablets (0.5-0.75 mg total) by mouth at bedtime., Disp: 90 tablet, Rfl: 11 .  rosuvastatin (CRESTOR) 10 MG tablet, TAKE 1 TABLET BY MOUTH EVERY DAY, Disp: 90 tablet, Rfl: 1 .  SUMAtriptan (IMITREX) 100 MG tablet, TAKE 1 TABLET BY MOUTH AS NEEDED FOR HEADACHE. MAY REPEAT IN 2 HRS IF NEEDED. MAX 200MG/DAY, Disp: , Rfl: 5 .  Suvorexant (BELSOMRA) 5 MG TABS, Take 5 mg by mouth at bedtime., Disp: 30 tablet, Rfl: 0 .  tiZANidine (ZANAFLEX) 2 MG tablet, , Disp: , Rfl:  .  isosorbide mononitrate (IMDUR) 60 MG 24 hr tablet, Take 1 tablet (60 mg total) by mouth daily., Disp: 90 tablet, Rfl: 3  EXAM:  VITALS per patient if applicable:  GENERAL: alert, oriented, appears well and in no acute distress  HEENT: atraumatic, conjunttiva clear, no obvious abnormalities on inspection of external nose and ears  NECK: normal movements of the head and  neck  LUNGS: on inspection no signs of respiratory distress, breathing rate appears normal, no obvious gross SOB, gasping or wheezing  CV: no obvious cyanosis  MS: moves all visible extremities without noticeable abnormality  PSYCH/NEURO:  pleasant and cooperative, no obvious depression or anxiety, speech and thought processing grossly intact  SKIN; no obvious intertrigo on exam today but excessive skin in upper thighs   ASSESSMENT AND PLAN:  Discussed the following assessment and plan:  Excess skin of thigh - Plan: Ambulatory referral to Plastic Surgery Dr. Claire Sanger Dillingham to disc options for tx   Intertrigo - disc otc antifungal creams and drying powders like goldbond talc free   Memory loss especially short term ? Etiology meds, psychiatric vs other Plan: Ambulatory referral to Neurology Dr. Ahern also disc with patient to disc memory with psychiatry  Hot flashes likely related menopause  -rec stop black cohosh with h/o elevated lfts  She is already on gabapentin, disc other options estrogen vs clonidine but will not rx after reviewed side effects  -disc fans, cool compress   HM Never gets flu shot  Consider Tdap ? Had 2009/2010 likely due for repeat consider shingrix in future rec hep B vaccinelow titer 6 08/18/17  Consider check MMR in futuretiter if insurance will cover   Pap pt has not had in a while wants to think about it if needed consider OB/GYNpt wants to wait Colonoscopy referredStill not hadconsider EGD not sch yet, needs colonoscopy as wellwas schedule but pt canceled appt and has f/u with Dr. T (GI) 09/29/2018   Mammogram7/16/19 normalordered [place] repeat  DEXA7/16/19 normal Of note home sleep studyordered by lung MD Dr. Ram never done from 08/2017  Of note pt getting iron infusions with h/o next sch 11/03/18      I discussed the assessment and treatment plan with the patient. The patient was provided an opportunity to ask questions  and all were answered. The patient agreed with the plan and demonstrated an understanding of the instructions.   The patient was advised to call back or seek an in-person evaluation if the symptoms worsen or if the condition fails to improve as anticipated.  Time spent 15 minutes  N McLean-Scocuzza, MD  

## 2018-09-09 NOTE — Patient Instructions (Addendum)
B12 shot scheduled 09/28/2018 11:30 am  Intertrigo Intertrigo is skin irritation or inflammation (dermatitis) that occurs when folds of skin rub together. The irritation can cause a rash and make skin raw and itchy. This condition most commonly occurs in the skin folds of these areas:  Toes.  Armpits.  Groin.  Under the belly.  Under the breasts.  Buttocks. Intertrigo is not passed from person to person (is not contagious). What are the causes? This condition is caused by heat, moisture, rubbing (friction), and not enough air circulation. The condition can be made worse by:  Sweat.  Bacteria.  A fungus, such as yeast. What increases the risk? This condition is more likely to occur if you have moisture in your skin folds. You are more likely to develop this condition if you:  Have diabetes.  Are overweight.  Are not able to move around or are not active.  Live in a warm and moist climate.  Wear splints, braces, or other medical devices.  Are not able to control your bowels or bladder (have incontinence). What are the signs or symptoms? Symptoms of this condition include:  A pink or red skin rash in the skin fold or near the skin fold.  Raw or scaly skin.  Itchiness.  A burning feeling.  Bleeding.  Leaking fluid.  A bad smell. How is this diagnosed? This condition is diagnosed with a medical history and physical exam. You may also have a skin swab to test for bacteria or a fungus. How is this treated? This condition may be treated by:  Cleaning and drying your skin.  Taking an antibiotic medicine or using an antibiotic skin cream for a bacterial infection.  Using an antifungal cream on your skin or taking pills for an infection that was caused by a fungus, such as yeast.  Using a steroid ointment to relieve itchiness and irritation.  Separating the skin fold with a clean cotton cloth to absorb moisture and allow air to flow into the area. Follow these  instructions at home:  Keep the affected area clean and dry.  Do not scratch your skin.  Stay in a cool environment as much as possible. Use an air conditioner or fan, if available.  Apply over-the-counter and prescription medicines only as told by your health care provider.  If you were prescribed an antibiotic medicine, use it as told by your health care provider. Do not stop using the antibiotic even if your condition improves.  Keep all follow-up visits as told by your health care provider. This is important. How is this prevented?   Maintain a healthy weight.  Take care of your feet, especially if you have diabetes. Foot care includes: ? Wearing shoes that fit well. ? Keeping your feet dry. ? Wearing clean, breathable socks.  Protect the skin around your groin and buttocks, especially if you have incontinence. Skin protection includes: ? Following a regular cleaning routine. ? Using skin protectant creams, powders, or ointments. ? Changing protection pads frequently.  Do not wear tight clothes. Wear clothes that are loose, absorbent, and made of cotton.  Wear a bra that gives good support, if needed.  Shower and dry yourself well after activity or exercise. Use a hair dryer on a cool setting to dry between skin folds, especially after you bathe.  If you have diabetes, keep your blood sugar under control. Contact a health care provider if:  Your symptoms do not improve with treatment.  Your symptoms get worse or they  spread.  You notice increased redness and warmth.  You have a fever. Summary  Intertrigo is skin irritation or inflammation (dermatitis) that occurs when folds of skin rub together.  This condition is caused by heat, moisture, rubbing (friction), and not enough air circulation.  This condition may be treated by cleaning and drying your skin and with medicines.  Apply over-the-counter and prescription medicines only as told by your health care  provider.  Keep all follow-up visits as told by your health care provider. This is important. This information is not intended to replace advice given to you by your health care provider. Make sure you discuss any questions you have with your health care provider. Document Released: 03/03/2005 Document Revised: 08/03/2017 Document Reviewed: 08/03/2017 Elsevier Interactive Patient Education  2019 Elsevier Inc.   Menopause Menopause is the normal time of life when menstrual periods stop completely. It is usually confirmed by 12 months without a menstrual period. The transition to menopause (perimenopause) most often happens between the ages of 6645 and 6255. During perimenopause, hormone levels change in your body, which can cause symptoms and affect your health. Menopause may increase your risk for:  Loss of bone (osteoporosis), which causes bone breaks (fractures).  Depression.  Hardening and narrowing of the arteries (atherosclerosis), which can cause heart attacks and strokes. What are the causes? This condition is usually caused by a natural change in hormone levels that happens as you get older. The condition may also be caused by surgery to remove both ovaries (bilateral oophorectomy). What increases the risk? This condition is more likely to start at an earlier age if you have certain medical conditions or treatments, including:  A tumor of the pituitary gland in the brain.  A disease that affects the ovaries and hormone production.  Radiation treatment for cancer.  Certain cancer treatments, such as chemotherapy or hormone (anti-estrogen) therapy.  Heavy smoking and excessive alcohol use.  Family history of early menopause. This condition is also more likely to develop earlier in women who are very thin. What are the signs or symptoms? Symptoms of this condition include:  Hot flashes.  Irregular menstrual periods.  Night sweats.  Changes in feelings about sex. This could  be a decrease in sex drive or an increased comfort around your sexuality.  Vaginal dryness and thinning of the vaginal walls. This may cause painful intercourse.  Dryness of the skin and development of wrinkles.  Headaches.  Problems sleeping (insomnia).  Mood swings or irritability.  Memory problems.  Weight gain.  Hair growth on the face and chest.  Bladder infections or problems with urinating. How is this diagnosed? This condition is diagnosed based on your medical history, a physical exam, your age, your menstrual history, and your symptoms. Hormone tests may also be done. How is this treated? In some cases, no treatment is needed. You and your health care provider should make a decision together about whether treatment is necessary. Treatment will be based on your individual condition and preferences. Treatment for this condition focuses on managing symptoms. Treatment may include:  Menopausal hormone therapy (MHT).  Medicines to treat specific symptoms or complications.  Acupuncture.  Vitamin or herbal supplements. Before starting treatment, make sure to let your health care provider know if you have a personal or family history of:  Heart disease.  Breast cancer.  Blood clots.  Diabetes.  Osteoporosis. Follow these instructions at home: Lifestyle  Do not use any products that contain nicotine or tobacco, such as cigarettes  and e-cigarettes. If you need help quitting, ask your health care provider.  Get at least 30 minutes of physical activity on 5 or more days each week.  Avoid alcoholic and caffeinated beverages, as well as spicy foods. This may help prevent hot flashes.  Get 7-8 hours of sleep each night.  If you have hot flashes, try: ? Dressing in layers. ? Avoiding things that may trigger hot flashes, such as spicy food, warm places, or stress. ? Taking slow, deep breaths when a hot flash starts. ? Keeping a fan in your home and office.  Find  ways to manage stress, such as deep breathing, meditation, or journaling.  Consider going to group therapy with other women who are having menopause symptoms. Ask your health care provider about recommended group therapy meetings. Eating and drinking  Eat a healthy, balanced diet that contains whole grains, lean protein, low-fat dairy, and plenty of fruits and vegetables.  Your health care provider may recommend adding more soy to your diet. Foods that contain soy include tofu, tempeh, and soy milk.  Eat plenty of foods that contain calcium and vitamin D for bone health. Items that are rich in calcium include low-fat milk, yogurt, beans, almonds, sardines, broccoli, and kale. Medicines  Take over-the-counter and prescription medicines only as told by your health care provider.  Talk with your health care provider before starting any herbal supplements. If prescribed, take vitamins and supplements as told by your health care provider. These may include: ? Calcium. Women age 32 and older should get 1,200 mg (milligrams) of calcium every day. ? Vitamin D. Women need 600-800 International Units of vitamin D each day. ? Vitamins B12 and B6. Aim for 50 micrograms of B12 and 1.5 mg of B6 each day. General instructions  Keep track of your menstrual periods, including: ? When they occur. ? How heavy they are and how long they last. ? How much time passes between periods.  Keep track of your symptoms, noting when they start, how often you have them, and how long they last.  Use vaginal lubricants or moisturizers to help with vaginal dryness and improve comfort during sex.  Keep all follow-up visits as told by your health care provider. This is important. This includes any group therapy or counseling. Contact a health care provider if:  You are still having menstrual periods after age 10.  You have pain during sex.  You have not had a period for 12 months and you develop vaginal bleeding.  Get help right away if:  You have: ? Severe depression. ? Excessive vaginal bleeding. ? Pain when you urinate. ? A fast or irregular heart beat (palpitations). ? Severe headaches. ? Abdomen (abdominal) pain or severe indigestion.  You fell and you think you have a broken bone.  You develop leg or chest pain.  You develop vision problems.  You feel a lump in your breast. Summary  Menopause is the normal time of life when menstrual periods stop completely. It is usually confirmed by 12 months without a menstrual period.  The transition to menopause (perimenopause) most often happens between the ages of 100 and 31.  Symptoms can be managed through medicines, lifestyle changes, and complementary therapies such as acupuncture.  Eat a balanced diet that is rich in nutrients to promote bone health and heart health and to manage symptoms during menopause. This information is not intended to replace advice given to you by your health care provider. Make sure you discuss any questions  you have with your health care provider. Document Released: 05/24/2003 Document Revised: 04/05/2016 Document Reviewed: 04/05/2016 Elsevier Interactive Patient Education  2019 [employer]

## 2018-09-13 ENCOUNTER — Telehealth: Payer: Self-pay | Admitting: Gastroenterology

## 2018-09-13 NOTE — Telephone Encounter (Signed)
Patient called l/m on v/m to r/s colonoscopy.

## 2018-09-14 ENCOUNTER — Other Ambulatory Visit: Payer: Self-pay

## 2018-09-14 DIAGNOSIS — D508 Other iron deficiency anemias: Secondary | ICD-10-CM

## 2018-09-14 DIAGNOSIS — Z1211 Encounter for screening for malignant neoplasm of colon: Secondary | ICD-10-CM

## 2018-09-14 NOTE — Telephone Encounter (Signed)
Pt scheduled for 09/28/2018 at the [facility] surgical center. Laressa Bolinger at [facility] aware that Dr. Bonna Gains will start at 8 am.

## 2018-09-14 NOTE — Telephone Encounter (Signed)
Also sent message via mychart

## 2018-09-14 NOTE — Telephone Encounter (Signed)
Left message for pt to contact office to reschedule her colonoscopy/EGD.

## 2018-09-16 ENCOUNTER — Other Ambulatory Visit: Payer: Self-pay

## 2018-09-16 ENCOUNTER — Ambulatory Visit (INDEPENDENT_AMBULATORY_CARE_PROVIDER_SITE_OTHER): Payer: Medicare Other | Admitting: Psychiatry

## 2018-09-16 ENCOUNTER — Encounter: Payer: Self-pay | Admitting: Psychiatry

## 2018-09-16 DIAGNOSIS — F4310 Post-traumatic stress disorder, unspecified: Secondary | ICD-10-CM | POA: Diagnosis not present

## 2018-09-16 DIAGNOSIS — I2510 Atherosclerotic heart disease of native coronary artery without angina pectoris: Secondary | ICD-10-CM | POA: Diagnosis not present

## 2018-09-16 DIAGNOSIS — F5105 Insomnia due to other mental disorder: Secondary | ICD-10-CM | POA: Diagnosis not present

## 2018-09-16 DIAGNOSIS — F331 Major depressive disorder, recurrent, moderate: Secondary | ICD-10-CM | POA: Diagnosis not present

## 2018-09-16 DIAGNOSIS — F1590 Other stimulant use, unspecified, uncomplicated: Secondary | ICD-10-CM | POA: Insufficient documentation

## 2018-09-16 DIAGNOSIS — F411 Generalized anxiety disorder: Secondary | ICD-10-CM | POA: Diagnosis not present

## 2018-09-16 MED ORDER — BELSOMRA 5 MG PO TABS: 5.0000 mg | ORAL_TABLET | Freq: Every day | ORAL | 1 refills | Status: DC

## 2018-09-16 NOTE — Progress Notes (Signed)
Virtual Visit via Telephone Note  I connected with Cheyenne Gray on 09/16/18 at  9:30 AM EDT by telephone and verified that I am speaking with the correct person using two identifiers.   I discussed the limitations, risks, security and privacy concerns of performing an evaluation and management service by telephone and the availability of in person appointments. I also discussed with the patient that there may be a patient responsible charge related to this service. The patient expressed understanding and agreed to proceed.    I discussed the assessment and treatment plan with the patient. The patient was provided an opportunity to ask questions and all were answered. The patient agreed with the plan and demonstrated an understanding of the instructions.   The patient was advised to call back or seek an in-person evaluation if the symptoms worsen or if the condition fails to improve as anticipated.  [facility] MD OP Progress Note  09/16/2018 9:44 AM Cheyenne Gray  MRN:  [id number]  Chief Complaint:  Chief Complaint    Follow-up     HPI: Cheyenne Gray is a 53 year old Caucasian female, widowed, on disability, has a history of PTSD, MDD, GAD, insomnia, caffeine use disorder, history of prolonged QT, chronic back pain on methadone, vitamin B12 deficiency, heart failure, iron deficiency, vitamin D deficiency, history of gastric bypass, non-ST elevation MI, hypertension was evaluated by phone today.  Patient was offered a video call however declined.  Patient today reports she is currently doing well on the current medication regimen.  Her daughter is currently back home.  They have been spending a lot of time together.  She reports she feels better with her mood lability and anxiety symptoms.  She reports sleep is good on the Belsomra.  She has been taking it more often now.  She denies side effects.  Patient reports she has been cutting back on the caffeine.  Patient denies any suicidality,  homicidality or perceptual disturbances.  She is motivated to continue to work with her therapist.  She denies any other concerns today. Visit Diagnosis:    ICD-10-CM   1. PTSD (post-traumatic stress disorder)  F43.10   2. GAD (generalized anxiety disorder)  F41.1   3. MDD (major depressive disorder), recurrent episode, moderate (HCC)  F33.1   4. Insomnia due to mental condition  F51.05 Suvorexant (BELSOMRA) 5 MG TABS  5. 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  Past trials of Paxil, Cymbalta, Valium, melatonin, trazodone, Ambien.  Past Medical History:  Past Medical History:  Diagnosis Date  . (HFpEF) heart failure with preserved ejection fraction ([place])    a. Echo 2014: EF 65-70%, nl WM, mildly dilated LA, PASP nl; b. 12/2014 Echo: EF 65-70%, no rwma, mod septal hypertrophy w/o LVOT gradient or SAM; c. 07/2017 Echo: EF 55-60%, no rwma, mildly dil RV w/ nl syst fxn. Mildly dil RA. Dilated IVC w/ elevated CVP. Triv post effusion.  Marland Kitchen Anxiety   . Arthritis   . Asthma   . B12 deficiency   . Chronic back pain   . Chronic headaches   . Chronic pain    a. on methadone  . Concussion    hx of 4  . Coronary artery disease, non-occlusive    a. LHC 1/18: proximal to mid LAD 40% stenosed, mid LAD 30% stenosed, mid RCA 20% stenosed, distal RCA 20% stenosed, EF 55-65%, LVEDP normal  . Depression   .  DJD (degenerative joint disease), multiple sites   . Frequent headaches   . Gallstone   . H/O non-ST elevation myocardial infarction (NSTEMI)   . Hand, foot and mouth disease 2016  . History of shingles   . Hypertension   . Iron deficiency anemia   . Long QT interval   . Methadone use ([place])    managed by Dr. Primus Bravo  . Migraine    1x/mo  . Motion sickness    cars  . Obesity   . Palpitations    a. 24 hour Holter: NSR, sinus brady down to 48, occasional PVCs & couplets, 8 beats NSVT; b. 30 day  event monitor 2015: NSR with rare PVC.  Marland Kitchen Psoriasis   . Syncope and collapse   . Vitamin D deficiency   . Wears dentures    full upper and lower    Past Surgical History:  Procedure Laterality Date  . ABDOMINOPLASTY     tummy tuck ? year   . [place] SURGERY  2001  . CARDIAC CATHETERIZATION Left 04/16/2016   Procedure: Left Heart Cath and Coronary Angiography;  Surgeon: [name], MD;  Location: [facility];  Service: Cardiovascular;  Laterality: Left;  . CHOLECYSTECTOMY  2001  . GALLBLADDER SURGERY    . GASTRIC BYPASS  2001  . GASTROPLASTY      Family Psychiatric History: I have reviewed family psychiatric history from my progress note on 11/06/2017.  Family History:  Family History  Problem Relation Age of Onset  . Heart attack Mother   . Cancer Mother        pancreatitic   . Early death Mother   . Heart attack Father 98       MI  . Early death Father   . Heart disease Father   . Heart attack Brother   . Heart disease Brother   . Arthritis Brother   . Depression Brother   . Diabetes Brother   . Heart attack Maternal Grandmother   . Cancer Maternal Grandmother        pancreatitic   . Heart disease Maternal Grandmother   . Cancer Maternal Uncle        pancreatitic   . Cancer Paternal Grandmother        ? type   . Diabetes Paternal Grandmother   . Cancer Maternal Uncle        pancreatitic   . Breast cancer Maternal Aunt     Social History: I have reviewed social history from my progress note on 11/06/2017. Social History   Socioeconomic History  . Marital status: Widowed    Spouse name: Not on file  . Number of children: 1  . Years of education: assoc degree  . Highest education level: Associate degree: occupational, Hotel manager, or vocational program  Occupational History  . Not on file  Social Needs  . Financial resource strain: Very hard  . Food insecurity    Worry: Never true    Inability: Never true  . Transportation needs     Medical: No    Non-medical: No  Tobacco Use  . Smoking status: Never Smoker  . Smokeless tobacco: Never Used  Substance and Sexual Activity  . Alcohol use: No    Alcohol/week: 0.0 standard drinks    Comment: holidays  . Drug use: No  . Sexual activity: Not Currently  Lifestyle  . Physical activity    Days per week: 0 days    Minutes per session: 0 min  .  Stress: Very much  Relationships  . Social Herbalist on phone: Once a week    Gets together: Never    Attends religious service: Never    Active member of club or organization: No    Attends meetings of clubs or organizations: Never    Relationship status: Widowed  Other Topics Concern  . 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 Penicillins Hives, Shortness Of Breath and Rash    Has patient had a PCN reaction causing immediate rash, facial/tongue/throat swelling, SOB or lightheadedness with hypotension: Yes Has patient had a PCN reaction causing severe rash involving mucus membranes or skin necrosis: No Has patient had a PCN reaction that required hospitalization No Has patient had a PCN reaction occurring within the last 10 years: No If all of the above answers are "NO", then may proceed with Cephalosporin use.     Metabolic Disorder Labs: Lab Results  Component Value Date   HGBA1C 5.3 09/01/2018   No results found for: PROLACTIN Lab Results  Component Value Date   CHOL 176 09/01/2018   TRIG 113.0 09/01/2018   HDL 57.90 09/01/2018   CHOLHDL 3 09/01/2018   VLDL 22.6 09/01/2018   [place] 95 09/01/2018   LDLCALC 47 10/02/2016   Lab Results  Component Value Date   TSH 2.63 09/01/2018   TSH 3.037 07/26/2017    Therapeutic Level Labs: No results found for: LITHIUM No  results found for: VALPROATE No components found for:  CBMZ  Current Medications: Current Outpatient Medications  Medication Sig Dispense Refill  . albuterol (PROVENTIL HFA;VENTOLIN HFA) 108 (90 BASE) MCG/ACT inhaler Inhale 2 puffs into the lungs as needed for wheezing.    Marland Kitchen BLACK COHOSH PO Take by mouth daily.    . busPIRone (BUSPAR) 15 MG tablet Take 1 tablet (15 mg total) by mouth 2 (two) times daily. 180 tablet 0  . Cholecalciferol (VITAMIN D3) 125 MCG (5000 UT) TABS Take by mouth daily.    . diclofenac sodium (VOLTAREN) 1 % GEL Apply 4 g topically 4 (four) times daily. Prn knees and 2 grams qid prn hands arthritis 100 g 11  . DULoxetine (CYMBALTA) 60 MG capsule TAKE 1 CAPSULE BY MOUTH EVERY DAY 90 capsule 0  . furosemide (LASIX) 40 MG tablet     . gabapentin (NEURONTIN) 400 MG capsule Limit 2 tablets in the a.m. and midday and 3 tablets each evening    Please dispense a three-month supply 630 capsule 0  . isosorbide mononitrate (IMDUR) 60 MG 24 hr tablet Take 1 tablet (60 mg total) by mouth daily. 90 tablet 3  . methadone (DOLOPHINE) 10 MG tablet Limit 1-2 tablets by mouth 2-3 times per day if tolerated 180 tablet 0  . nitroGLYCERIN (NITROSTAT) 0.4 MG SL tablet Place 1 tablet (0.4 mg total) under the tongue every 5 (five) minutes as needed for chest pain. 25 tablet 3  . rOPINIRole (REQUIP) 0.25 MG tablet Take 2-3 tablets (0.5-0.75 mg total) by mouth at bedtime. 90 tablet 11  . rosuvastatin (CRESTOR) 10 MG tablet TAKE 1 TABLET BY MOUTH EVERY DAY 90 tablet 1  . SUMAtriptan (IMITREX) 100 MG tablet  TAKE 1 TABLET BY MOUTH AS NEEDED FOR HEADACHE. MAY REPEAT IN 2 HRS IF NEEDED. MAX 200MG/DAY  5  . Suvorexant (BELSOMRA) 5 MG TABS Take 5 mg by mouth at bedtime. 30 tablet 1  . tiZANidine (ZANAFLEX) 2 MG tablet      No current facility-administered medications for this visit.      Musculoskeletal: Strength & Muscle Tone: UTA Gait & Station: Reports as wnl Patient leans: N/A  Psychiatric  Specialty Exam: Review of Systems  Psychiatric/Behavioral: Negative for depression. The patient is not nervous/anxious.   All other systems reviewed and are negative.   Last menstrual period 02/18/2016.There is no height or weight on file to calculate BMI.  General Appearance: UTA  Eye Contact:  UTA  Speech:  Clear and Coherent  Volume:  Normal  Mood:  Euthymic  Affect:  UTA  Thought Process:  Goal Directed and Descriptions of Associations: Intact  Orientation:  Full (Time, Place, and Person)  Thought Content: Logical   Suicidal Thoughts:  No  Homicidal Thoughts:  No  Memory:  Immediate;   Fair Recent;   Fair Remote;   Fair  Judgement:  Fair  Insight:  Fair  Psychomotor Activity:  UTA  Concentration:  Concentration: Fair and Attention Span: Fair  Recall:  [employer] of Knowledge: Fair  Language: Fair  Akathisia:  No  Handed:  Right  AIMS (if indicated): Denies tremors, rigidity  Assets:  Communication Skills Desire for Improvement Social Support  ADL's:  Intact  Cognition: WNL  Sleep:  Fair   Screenings: GAD-7     Office Visit from 09/21/2015 in [facility]  Total GAD-7 Score  4    Mini-Mental     Office Visit from 10/30/2017 in [facility]  Total Score (max 30 points )  27    PHQ2-9     Office Visit from 08/18/2017 in [facility] Office Visit from 11/15/2015 in [facility] Office Visit from 10/18/2015 in [facility] Office Visit from 09/21/2015 in [facility] Office Visit from 07/25/2015 in [facility]  PHQ-2 Total Score  0  0  0  0  0       Assessment and Plan: Brigitta is a 53 year old Caucasian female, widowed, has a history of PTSD, depression, anxiety, multiple medical problems including coronary artery disease,  history of an STEMI, long QT per history, gastric bypass, vitamin D, vitamin B12 deficiency, chronic pain on methadone was evaluated by telemedicine today.  Patient is biologically predisposed given her multiple medical problems, trauma, family history of mental health problems.  She however is currently making progress on the current medication regimen.  Plan as noted below.  Plan PTSD- stable Cymbalta 60 mg p.o. daily.  Patient is on reduced dosage due to nausea on higher dosage. BuSpar 15 mg p.o. 3 times daily Continue psychotherapy with therapist here in clinic.  For MDD-improving Cymbalta as prescribed Continue CBT  For GAD-improving BuSpar and Cymbalta as prescribed  For insomnia-improving Belsomra 5 mg p.o. nightly I have reviewed [place] controlled substance database.  Caffeine use disorder- improving Patient is cutting back.  Follow-up in clinic in 2 to 3 months or sooner if needed.  September 9 at 10 AM.  I have spent atleast 15 minutes non face to face with patient today. More than 50 % of the time was  spent for psychoeducation and supportive psychotherapy and care coordination.  This note was generated in part or whole with voice recognition software. Voice recognition is usually quite accurate but there are transcription errors that can and very often do occur. I apologize for any typographical errors that were not detected and corrected.      Ursula Alert, MD 09/16/2018, 9:44 AM

## 2018-09-23 ENCOUNTER — Encounter: Payer: Self-pay | Admitting: Internal Medicine

## 2018-09-24 ENCOUNTER — Other Ambulatory Visit: Payer: Self-pay

## 2018-09-24 ENCOUNTER — Other Ambulatory Visit
Admission: RE | Admit: 2018-09-24 | Discharge: 2018-09-24 | Disposition: A | Discharge status: Home / Self Care | Payer: Medicare Other | Source: Ambulatory Visit | Attending: Gastroenterology | Admitting: Gastroenterology

## 2018-09-24 DIAGNOSIS — Z1159 Encounter for screening for other viral diseases: Secondary | ICD-10-CM | POA: Insufficient documentation

## 2018-09-24 DIAGNOSIS — Z01812 Encounter for preprocedural laboratory examination: Secondary | ICD-10-CM | POA: Insufficient documentation

## 2018-09-25 LAB — SARS CORONAVIRUS 2 (TAT 6-24 HRS): SARS Coronavirus 2: NEGATIVE

## 2018-09-26 NOTE — Anesthesia Preprocedure Evaluation (Addendum)
Anesthesia Evaluation  Patient identified by MRN, date of birth, ID band Patient awake    Reviewed: Allergy & Precautions, NPO status , Patient's Chart, lab work & pertinent test results  History of Anesthesia Complications Negative for: history of anesthetic complications  Airway Mallampati: I   Neck ROM: Full    Dental  (+) Edentulous Upper, Edentulous Lower   Pulmonary asthma ,    Pulmonary exam normal breath sounds clear to auscultation       Cardiovascular hypertension, + CAD and + Peripheral Vascular Disease  Normal cardiovascular exam Rhythm:Regular Rate:Normal  HFpEF; palpitations/PVCs; long QT syndrome  ECG 03/04/18: NSR; possible left atrial enlargement; possible lateral infarct, age undetermined  Cardiac cath 03/2016:  Cardiac cath 03/2016  Prox LAD to Mid LAD lesion, 40 %stenosed.  Mid LAD lesion, 30 %stenosed.  Mid RCA lesion, 20 %stenosed.  Dist RCA lesion, 20 %stenosed.  The left ventricular systolic function is normal.  LV end diastolic pressure is normal.  The left ventricular ejection fraction is 55-65% by visual estimate.  Myocardial perfusion 03/27/16:  Pharmacological myocardial perfusion imaging study with small to moderate sized region of predominantly fixed infarct in the mid to distal anteroseptal, anteroseptal apical wall with mild peri-infarct ischemia Normal wall motion, EF estimated at 51% No EKG changes concerning for ischemia at peak stress or in recovery. Baseline nonspecific ST abnormality Low risk scan   Neuro/Psych  Headaches, PSYCHIATRIC DISORDERS Anxiety Depression Chronic pain on methadone    GI/Hepatic S/p gastric bypass   Endo/Other  Obesity s/p gastric bypass  Renal/GU negative Renal ROS     Musculoskeletal  (+) Arthritis ,   Abdominal   Peds  Hematology  (+) Blood dyscrasia, anemia ,   Anesthesia Other Findings Cardiology note 06/09/18:  ASSESSMENT & PLAN:    PVD (peripheral vascular disease) (Cheyenne Gray) CT scan images pulled up showing mild aortic atherosclerosis, mild coronary calcification This was previously known Cholesterol well controlled, non-smoker,  Borderline elevated glucose levels, we recommended that she cut back on her soda  Chronic heart failure with preserved ejection fraction (Cheyenne Gray) Reports she is euvolemic, Denies shortness of breath No medication changes made  Coronary artery disease of native artery of native heart with stable angina pectoris (Cheyenne Gray) Discussed previous cardiac catheterization results Discussed CT scan findings and how they correlate  Mixed hyperlipidemia Discussed previous lipid panel done 1 year ago Previous lipids at goal Given her markedly elevated LFTs recommend she stop the Crestor for now  Transaminitis Etiology unclear, will stop Crestor, work-up per primary care Otherwise reports she feels well   Reproductive/Obstetrics                            Anesthesia Physical Anesthesia Plan  ASA: IV  Anesthesia Plan: General   Post-op Pain Management:    Induction: Intravenous  PONV Risk Score and Plan: 3 and Propofol infusion and TIVA  Airway Management Planned: Natural Airway  Additional Equipment:   Intra-op Plan:   Post-operative Plan:   Informed Consent: I have reviewed the patients History and Physical, chart, labs and discussed the procedure including the risks, benefits and alternatives for the proposed anesthesia with the patient or authorized representative who has indicated his/her understanding and acceptance.       Plan Discussed with: CRNA  Anesthesia Plan Comments:        Anesthesia Quick Evaluation

## 2018-09-27 ENCOUNTER — Encounter: Payer: Self-pay | Admitting: *Deleted

## 2018-09-27 ENCOUNTER — Other Ambulatory Visit: Payer: Self-pay

## 2018-09-27 NOTE — Discharge Instructions (Signed)

## 2018-09-28 ENCOUNTER — Ambulatory Visit
Admission: RE | Admit: 2018-09-28 | Discharge: 2018-09-28 | Disposition: A | Discharge status: Home / Self Care | Payer: Medicare Other | Attending: Gastroenterology | Admitting: Gastroenterology

## 2018-09-28 ENCOUNTER — Ambulatory Visit: Payer: Medicare Other | Admitting: Anesthesiology

## 2018-09-28 ENCOUNTER — Ambulatory Visit: Payer: Medicare Other

## 2018-09-28 ENCOUNTER — Encounter
Admission: RE | Disposition: A | Discharge status: Home / Self Care | Payer: Self-pay | Source: Home / Self Care | Attending: Gastroenterology

## 2018-09-28 DIAGNOSIS — D508 Other iron deficiency anemias: Secondary | ICD-10-CM

## 2018-09-28 DIAGNOSIS — R933 Abnormal findings on diagnostic imaging of other parts of digestive tract: Secondary | ICD-10-CM | POA: Diagnosis not present

## 2018-09-28 DIAGNOSIS — I110 Hypertensive heart disease with heart failure: Secondary | ICD-10-CM | POA: Insufficient documentation

## 2018-09-28 DIAGNOSIS — I5032 Chronic diastolic (congestive) heart failure: Secondary | ICD-10-CM | POA: Insufficient documentation

## 2018-09-28 DIAGNOSIS — K228 Other specified diseases of esophagus: Secondary | ICD-10-CM

## 2018-09-28 DIAGNOSIS — Z6837 Body mass index (BMI) 37.0-37.9, adult: Secondary | ICD-10-CM | POA: Insufficient documentation

## 2018-09-28 DIAGNOSIS — I252 Old myocardial infarction: Secondary | ICD-10-CM | POA: Insufficient documentation

## 2018-09-28 DIAGNOSIS — Z9884 Bariatric surgery status: Secondary | ICD-10-CM | POA: Diagnosis not present

## 2018-09-28 DIAGNOSIS — D509 Iron deficiency anemia, unspecified: Secondary | ICD-10-CM | POA: Insufficient documentation

## 2018-09-28 DIAGNOSIS — Z79899 Other long term (current) drug therapy: Secondary | ICD-10-CM | POA: Insufficient documentation

## 2018-09-28 DIAGNOSIS — E669 Obesity, unspecified: Secondary | ICD-10-CM | POA: Insufficient documentation

## 2018-09-28 DIAGNOSIS — F419 Anxiety disorder, unspecified: Secondary | ICD-10-CM | POA: Insufficient documentation

## 2018-09-28 DIAGNOSIS — I2510 Atherosclerotic heart disease of native coronary artery without angina pectoris: Secondary | ICD-10-CM | POA: Insufficient documentation

## 2018-09-28 DIAGNOSIS — Z1211 Encounter for screening for malignant neoplasm of colon: Secondary | ICD-10-CM

## 2018-09-28 DIAGNOSIS — M1990 Unspecified osteoarthritis, unspecified site: Secondary | ICD-10-CM | POA: Insufficient documentation

## 2018-09-28 HISTORY — PX: COLONOSCOPY WITH PROPOFOL: SHX5780

## 2018-09-28 HISTORY — PX: ESOPHAGOGASTRODUODENOSCOPY (EGD) WITH PROPOFOL: SHX5813

## 2018-09-28 SURGERY — COLONOSCOPY WITH PROPOFOL
Anesthesia: General | Site: Rectum

## 2018-09-28 MED ORDER — ACETAMINOPHEN 325 MG PO TABS: 650.0000 mg | ORAL_TABLET | Freq: Once | ORAL | Status: DC | PRN

## 2018-09-28 MED ORDER — ACETAMINOPHEN 160 MG/5ML PO SOLN: 325.0000 mg | ORAL | Status: DC | PRN

## 2018-09-28 MED ORDER — STERILE WATER FOR IRRIGATION IR SOLN
Status: DC | PRN
  Administered 2018-09-28: 12:00:00 30 mL
  Administered 2018-09-28: 15 mL

## 2018-09-28 MED ORDER — LIDOCAINE HCL (CARDIAC) PF 100 MG/5ML IV SOSY
PREFILLED_SYRINGE | INTRAVENOUS | Status: DC | PRN
  Administered 2018-09-28: 50 mg via INTRAVENOUS

## 2018-09-28 MED ORDER — PROPOFOL 10 MG/ML IV BOLUS
INTRAVENOUS | Status: DC | PRN
  Administered 2018-09-28: 100 mg via INTRAVENOUS
  Administered 2018-09-28 (×2): 50 mg via INTRAVENOUS
  Administered 2018-09-28: 100 mg via INTRAVENOUS
  Administered 2018-09-28: 50 mg via INTRAVENOUS
  Administered 2018-09-28: 100 mg via INTRAVENOUS

## 2018-09-28 MED ORDER — GLYCOPYRROLATE 0.2 MG/ML IJ SOLN
INTRAMUSCULAR | Status: DC | PRN
  Administered 2018-09-28: 0.2 mg via INTRAVENOUS

## 2018-09-28 MED ORDER — LACTATED RINGERS IV SOLN
INTRAVENOUS | Status: DC
  Administered 2018-09-28: 11:00:00 via INTRAVENOUS

## 2018-09-28 MED ORDER — SODIUM CHLORIDE 0.9 % IV SOLN: INTRAVENOUS | Status: DC

## 2018-09-28 SURGICAL SUPPLY — 19 items
BLOCK BITE 60FR ADLT L/F GRN (MISCELLANEOUS) ×3 IMPLANT
CANISTER SUCT 1200ML W/VALVE (MISCELLANEOUS) ×3 IMPLANT
CLIP HMST 235XBRD CATH ROT (MISCELLANEOUS) IMPLANT
CLIP RESOLUTION 360 11X235 (MISCELLANEOUS)
ELECT REM PT RETURN 9FT ADLT (ELECTROSURGICAL)
ELECTRODE REM PT RTRN 9FT ADLT (ELECTROSURGICAL) IMPLANT
FORCEPS BIOP RAD 4 LRG CAP 4 (CUTTING FORCEPS) ×1 IMPLANT
GOWN CVR UNV OPN BCK APRN NK (MISCELLANEOUS) ×4 IMPLANT
GOWN ISOL THUMB LOOP REG UNIV (MISCELLANEOUS) ×2
INJECTOR VARIJECT VIN23 (MISCELLANEOUS) IMPLANT
KIT ENDO PROCEDURE OLY (KITS) ×3 IMPLANT
MARKER SPOT ENDO TATTOO 5ML (MISCELLANEOUS) IMPLANT
SNARE SHORT THROW 13M SML OVAL (MISCELLANEOUS) IMPLANT
SNARE SHORT THROW 30M LRG OVAL (MISCELLANEOUS) IMPLANT
SNARE SNG USE RND 15MM (INSTRUMENTS) IMPLANT
SPOT EX ENDOSCOPIC TATTOO (MISCELLANEOUS)
TRAP ETRAP POLY (MISCELLANEOUS) IMPLANT
VARIJECT INJECTOR VIN23 (MISCELLANEOUS)
WATER STERILE IRR 500ML POUR (IV SOLUTION) ×1 IMPLANT

## 2018-09-28 NOTE — H&P (Signed)
Cheyenne Antigua, MD [address], [place], [place], [place], [id number] [id number], [place], [place], [place], [address] Phone: [phone]  Fax: [phone]  Primary Care Physician:  McLean-Scocuzza, Nino Glow, MD   Pre-Procedure History & Physical: HPI:  Cheyenne Gray is a 53 y.o. female is here for a colonoscopy and EGD.   Past Medical History:  Diagnosis Date   (HFpEF) heart failure with preserved ejection fraction ([place])    a. Echo 2014: EF 65-70%, nl WM, mildly dilated LA, PASP nl; b. 12/2014 Echo: EF 65-70%, no rwma, mod septal hypertrophy w/o LVOT gradient or SAM; c. 07/2017 Echo: EF 55-60%, no rwma, mildly dil RV w/ nl syst fxn. Mildly dil RA. Dilated IVC w/ elevated CVP. Triv post effusion.   Anxiety    Arthritis    Asthma    B12 deficiency    Chronic back pain    Chronic headaches    Chronic pain    a. on methadone   Concussion    hx of 4   Coronary artery disease, non-occlusive    a. LHC 1/18: proximal to mid LAD 40% stenosed, mid LAD 30% stenosed, mid RCA 20% stenosed, distal RCA 20% stenosed, EF 55-65%, LVEDP normal   Depression    DJD (degenerative joint disease), multiple sites    Frequent headaches    Gallstone    H/O non-ST elevation myocardial infarction (NSTEMI)    Hand, foot and mouth disease 2016   History of shingles    Hypertension    Iron deficiency anemia    Long QT interval    Methadone use ([place])    managed by Dr. Primus Bravo   Migraine    1x/mo   Motion sickness    cars   Obesity    Palpitations    a. 24 hour Holter: NSR, sinus brady down to 48, occasional PVCs & couplets, 8 beats NSVT; b. 30 day event monitor 2015: NSR with rare PVC.   Psoriasis    Syncope and collapse    Vitamin D deficiency    Wears dentures    full upper and lower    Past Surgical History:  Procedure Laterality Date   ABDOMINOPLASTY     tummy tuck ? year    BARIATRIC SURGERY  2001   CARDIAC CATHETERIZATION Left 04/16/2016   Procedure: Left Heart Cath and Coronary Angiography;  Surgeon: [name], MD;  Location: [facility];  Service: Cardiovascular;  Laterality: Left;   CHOLECYSTECTOMY  2001   GALLBLADDER SURGERY     GASTRIC BYPASS  2001   GASTROPLASTY      Prior to Admission medications   Medication Sig Start Date End Date Taking? Authorizing Provider  albuterol (PROVENTIL HFA;VENTOLIN HFA) 108 (90 BASE) MCG/ACT inhaler Inhale 2 puffs into the lungs as needed for wheezing.   Yes [provider]  BLACK COHOSH PO Take by mouth daily.   Yes [provider]  busPIRone (BUSPAR) 15 MG tablet Take 1 tablet (15 mg total) by mouth 2 (two) times daily. 07/30/18  Yes Ursula Alert, MD  Cholecalciferol (VITAMIN D3) 125 MCG (5000 UT) TABS Take by mouth daily.   Yes [provider]  diclofenac sodium (VOLTAREN) 1 % GEL Apply 4 g topically 4 (four) times daily. Prn knees and 2 grams qid prn hands arthritis 05/05/18  Yes McLean-Scocuzza, Nino Glow, MD  DULoxetine (CYMBALTA) 60 MG capsule TAKE 1 CAPSULE BY MOUTH EVERY DAY 07/30/18  Yes Ursula Alert, MD  furosemide (LASIX)  40 MG tablet  12/30/17  Yes [provider]  gabapentin (NEURONTIN) 400 MG capsule Limit 2 tablets in the a.m. and midday and 3 tablets each evening    Please dispense a three-month supply 11/15/15  Yes Mohammed Kindle, MD  isosorbide mononitrate (IMDUR) 60 MG 24 hr tablet Take 1 tablet (60 mg total) by mouth daily. 03/04/18 09/27/18 Yes Theora Gianotti, NP  methadone (DOLOPHINE) 10 MG tablet Limit 1-2 tablets by mouth 2-3 times per day if tolerated 11/15/15  Yes Mohammed Kindle, MD  nitroGLYCERIN (NITROSTAT) 0.4 MG SL tablet Place 1 tablet (0.4 mg total) under the tongue every 5 (five) minutes as needed for chest pain. 03/04/18 03/04/19 Yes Theora Gianotti, NP  rOPINIRole (REQUIP) 0.25 MG tablet Take 2-3 tablets (0.5-0.75 mg total) by mouth at bedtime. 03/31/18  Yes McLean-Scocuzza, Nino Glow, MD   rosuvastatin (CRESTOR) 10 MG tablet TAKE 1 TABLET BY MOUTH EVERY DAY 12/03/17  Yes Gollan, Kathlene November, MD  SUMAtriptan (IMITREX) 100 MG tablet TAKE 1 TABLET BY MOUTH AS NEEDED FOR HEADACHE. MAY REPEAT IN 2 HRS IF NEEDED. MAX 200MG/DAY 10/09/17  Yes [provider]  Suvorexant (BELSOMRA) 5 MG TABS Take 5 mg by mouth at bedtime. 09/16/18  Yes Ursula Alert, MD  tiZANidine (ZANAFLEX) 2 MG tablet  04/08/18  Yes [provider]    Allergies as of 09/14/2018 - Review Complete 09/09/2018  Allergen Reaction Noted   Penicillins Hives, Shortness Of Breath, and Rash 07/20/2009    Family History  Problem Relation Age of Onset   Heart attack Mother    Cancer Mother        pancreatitic    Early death Mother    Heart attack Father 15       MI   Early death Father    Heart disease Father    Heart attack Brother    Heart disease Brother    Arthritis Brother    Depression Brother    Diabetes Brother    Heart attack Maternal Grandmother    Cancer Maternal Grandmother        pancreatitic    Heart disease Maternal Grandmother    Cancer Maternal Uncle        pancreatitic    Cancer Paternal Grandmother        ? type    Diabetes Paternal Grandmother    Cancer Maternal Uncle        pancreatitic    Breast cancer Maternal Aunt     Social History   Socioeconomic History   Marital status: Widowed    Spouse name: Not on file   Number of children: 1   Years of education: assoc degree   Highest education level: Associate degree: occupational, Hotel manager, or vocational program  Occupational History   Not on file  Social Needs   Financial resource strain: Very hard   Food insecurity    Worry: Never true    Inability: Never true   Transportation needs    Medical: No    Non-medical: No  Tobacco Use   Smoking status: Never Smoker   Smokeless tobacco: Never Used  Substance and Sexual Activity   Alcohol use: No    Alcohol/week: 0.0 standard drinks     Comment: holidays   Drug use: No   Sexual activity: Not Currently  Lifestyle   Physical activity    Days per week: 0 days    Minutes per session: 0 min   Stress: Very much  Relationships  Social Herbalist on phone: Once a week    Gets together: Never    Attends religious service: Never    Active member of club or organization: No    Attends meetings of clubs or organizations: Never    Relationship status: Widowed   Intimate partner violence    Fear of current or ex partner: No    Emotionally abused: No    Physically abused: No    Forced sexual activity: No  Other Topics Concern   Not on file  Social History Narrative   Adopted daughter [name] [phone]    Significant other mac [phone], former husband died    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 Neck:  Supple; no masses or thyromegaly. Lungs:  Clear throughout to auscultation, normal respiratory effort.    Heart:  +S1, +S2, Regular rate and rhythm, No edema. Abdomen:  Soft, nontender and nondistended. Normal bowel sounds, without guarding, and without rebound.   Neurologic:  Alert and  oriented x4;  grossly normal neurologically.  Impression/Plan: Cheyenne Gray is here for a colonoscopy and EGD for Iron deficiency anemia, Colon thickening, history of food impaction.  Risks, benefits, limitations, and alternatives regarding the procedures have been reviewed with the patient.  Questions have been answered.  All parties agreeable.   Virgel Manifold, MD  09/28/2018, 11:11 AM

## 2018-09-28 NOTE — Anesthesia Postprocedure Evaluation (Signed)
Anesthesia Post Note  Patient: Cheyenne Gray  Procedure(s) Performed: COLONOSCOPY WITH PROPOFOL (N/A Rectum) ESOPHAGOGASTRODUODENOSCOPY (EGD) WITH BIOPSY (N/A Esophagus)  Patient location during evaluation: PACU Anesthesia Type: General Level of consciousness: awake and alert, oriented and patient cooperative Pain management: pain level controlled Vital Signs Assessment: post-procedure vital signs reviewed and stable Respiratory status: spontaneous breathing, nonlabored ventilation and respiratory function stable Cardiovascular status: blood pressure returned to baseline and stable Postop Assessment: adequate PO intake Anesthetic complications: no    Darrin Nipper

## 2018-09-28 NOTE — Op Note (Signed)
[facility] Gastroenterology Patient Name: Cheyenne Gray Procedure Date: 09/28/2018 11:57 AM MRN: [id number] Account #: [id number] Date of Birth: 23-Jul-1965 Admit Type: Outpatient Age: [age] Room: [facility] ROOM 01 Gender: Female Note Status: Finalized Procedure:            Colonoscopy Indications:          Iron deficiency anemia, Abnormal CT of the GI tract Providers:            Chalisa Kobler B. Maximino Greenlandahiliani MD, MD Medicines:            Monitored Anesthesia Care Complications:        No immediate complications. Procedure:            Pre-Anesthesia Assessment:                       - Prior to the procedure, a History and Physical was                        performed, and patient medications, allergies and                        sensitivities were reviewed. The patient's tolerance of                        previous anesthesia was reviewed.                       - The risks and benefits of the procedure and the                        sedation options and risks were discussed with the                        patient. All questions were answered and informed                        consent was obtained.                       - Patient identification and proposed procedure were                        verified prior to the procedure by the physician, the                        nurse, the anesthetist and the technician. The                        procedure was verified in the pre-procedure area in the                        procedure room in the endoscopy suite.                       - ASA Grade Assessment: II - A patient with mild                        systemic disease.                       - After reviewing the risks  and benefits, the patient                        was deemed in satisfactory condition to undergo the                        procedure.                       After obtaining informed consent, the colonoscope was                        passed under direct vision.  Throughout the procedure,                        the patient's blood pressure, pulse, and oxygen                        saturations were monitored continuously. The                        Colonoscope was introduced through the anus and                        advanced to the the cecum, identified by appendiceal                        orifice and ileocecal valve. The colonoscopy was                        performed with ease. The patient tolerated the                        procedure well. The quality of the bowel preparation                        was poor. Findings:      The perianal and digital rectal examinations were normal.      A moderate amount of semi-liquid semi-solid stool was found in the       entire colon, interfering with visualization. Lavage of the area was       performed using normal saline. Over 1 L of stool and water suctioned.,       resulting in clearance with fair visualization.      There is no endoscopic evidence of mass in the entire colon.      The exam was otherwise normal throughout the examined colon.      The retroflexed view of the distal rectum and anal verge was normal and       showed no anal or rectal abnormalities. Impression:           - Preparation of the colon was poor.                       - Stool in the entire examined colon.                       - No specimens collected.                       - Due to the prep, this was not a satisfactory exam for  colorectal cancer screening, that is evaluation for                        small or flat lesions or polyps. Patient should follow                        up in clinic after discharge to discuss need for future                        colonoscopy and colorectal cancer screening. Recommendation:       - Discharge patient to home.                       - Resume previous diet.                       - Continue present medications.                       - Repeat colonoscopy within 3  months because the bowel                        preparation was poor.                       - Return to primary care physician as previously                        scheduled.                       - The findings and recommendations were discussed with                        the patient.                       - The findings and recommendations were discussed with                        the patient's family. Procedure Code(s):    --- Professional ---                       [phone], Colonoscopy, flexible; diagnostic, including                        collection of specimen(s) by brushing or washing, when                        performed (separate procedure) Diagnosis Code(s):    --- Professional ---                       D50.9, Iron deficiency anemia, unspecified                       R93.3, Abnormal findings on diagnostic imaging of other                        parts of digestive tract CPT copyright 2019 American Medical Association. All rights reserved. The codes documented in this report are preliminary and upon coder review may  be revised to meet current compliance  requirements.  Melodie BouillonVarnita Doral Digangi, MD Michel BickersVarnita B. Maximino Greenlandahiliani MD, MD 09/28/2018 12:51:01 PM This report has been signed electronically. Number of Addenda: 0 Note Initiated On: 09/28/2018 11:57 AM Scope Withdrawal Time: 0 hours 13 minutes 52 seconds  Total Procedure Duration: 0 hours 21 minutes 59 seconds       [facility]

## 2018-09-28 NOTE — Transfer of Care (Signed)
Immediate Anesthesia Transfer of Care Note  Patient: Cheyenne Gray  Procedure(s) Performed: COLONOSCOPY WITH PROPOFOL (N/A Rectum) ESOPHAGOGASTRODUODENOSCOPY (EGD) WITH BIOPSY (N/A Esophagus)  Patient Location: PACU  Anesthesia Type: General  Level of Consciousness: awake, alert  and patient cooperative  Airway and Oxygen Therapy: Patient Spontanous Breathing and Patient connected to supplemental oxygen  Post-op Assessment: Post-op Vital signs reviewed, Patient's Cardiovascular Status Stable, Respiratory Function Stable, Patent Airway and No signs of Nausea or vomiting  Post-op Vital Signs: Reviewed and stable  Complications: No apparent anesthesia complications

## 2018-09-28 NOTE — Op Note (Signed)
[facility] Gastroenterology Patient Name: Cheyenne Gray Procedure Date: 09/28/2018 11:58 AM MRN: [id number] Account #: [id number] Date of Birth: 1965/12/14 Admit Type: Outpatient Age: 53 Room: [facility] OR ROOM 01 Gender: Female Note Status: Finalized Procedure:            Upper GI endoscopy Indications:          Iron deficiency anemia Providers:            Ciana Simmon B. Bonna Gains MD, MD Referring MD:         Nino Glow Mclean-Scocuzza MD, MD (Referring MD) Medicines:            Monitored Anesthesia Care Complications:        No immediate complications. Procedure:            Pre-Anesthesia Assessment:                       - The risks and benefits of the procedure and the                        sedation options and risks were discussed with the                        patient. All questions were answered and informed                        consent was obtained.                       - Patient identification and proposed procedure were                        verified prior to the procedure.                       - ASA Grade Assessment: II - A patient with mild                        systemic disease.                       After obtaining informed consent, the endoscope was                        passed under direct vision. Throughout the procedure,                        the patient's blood pressure, pulse, and oxygen                        saturations were monitored continuously. The was                        introduced through the mouth, and advanced to the                        jejunum. The upper GI endoscopy was accomplished with                        ease. The patient tolerated the procedure well. 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  There is no endoscopic evidence of stenosis or stricture in the entire       esophagus.      There is no endoscopic evidence of stenosis or stricture in the entire       esophagus. Biopsies were  obtained from the proximal and distal esophagus       with cold forceps for histology of suspected eosinophilic esophagitis.       Traces of yellow material seen in the esophagus. Possible old food       material. No solid food pieces seen.      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 This was       characterized by healthy appearing mucosa. Old staples seen. Mildly       friable mucosa at the anatomosis. No ulcers.      The examined jejunum was normal. Impression:           - Hypertonic lower esophageal sphincter.                       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                       - Normal examined jejunum. Recommendation:       - Await pathology results.                       - Return to my office in 4 weeks.                       - Consider esophageal motility study                       - Continue present medications.                       - The findings and recommendations were discussed with                        the patient.                       - The findings and recommendations were discussed with                        the patient's family. Procedure Code(s):    --- Professional ---                       [phone], Esophagogastroduodenoscopy, flexible, transoral;                        with biopsy, single or multiple Diagnosis Code(s):    --- Professional ---                       K22.8, Other specified diseases of esophagus                       Z98.84, Bariatric surgery status  D50.9, Iron deficiency anemia, unspecified CPT copyright 2019 American Medical Association. All rights reserved. The codes documented in this report are preliminary and upon coder review may  be revised to meet current compliance requirements.  Melodie BouillonVarnita Lollie Gunner, MD Michel BickersVarnita B. Maximino Greenlandahiliani MD, MD 09/28/2018 12:23:46 PM This report has been signed electronically. Number of Addenda:  0 Note Initiated On: 09/28/2018 11:58 AM Total Procedure Duration: 0 hours 7 minutes 27 seconds  Estimated Blood Loss: Estimated blood loss: none.      [facility]

## 2018-09-29 ENCOUNTER — Ambulatory Visit: Payer: Medicare Other | Admitting: Gastroenterology

## 2018-09-29 ENCOUNTER — Encounter: Payer: Self-pay | Admitting: Gastroenterology

## 2018-09-30 ENCOUNTER — Telehealth: Payer: Self-pay

## 2018-09-30 NOTE — Telephone Encounter (Signed)
Do you have this?

## 2018-09-30 NOTE — Telephone Encounter (Signed)
No I dont have anything   [place]

## 2018-09-30 NOTE — Telephone Encounter (Signed)
Copied from [place] [id number]. Topic: General - Other >> Sep 30, 2018 11:17 AM Rayann Heman wrote: Reason for CRM:  Mallie Mussel calling from [place] called to see if we received a fax. Please advise

## 2018-10-01 NOTE — Telephone Encounter (Signed)
Faxed back to [facility].

## 2018-10-01 NOTE — Telephone Encounter (Signed)
Cheyenne Gray called to check if fax from [facility] was received. They faxed it over on 7.15.20  And again on 7.16.20/ please advise if it was received

## 2018-10-04 ENCOUNTER — Encounter: Payer: Self-pay | Admitting: Gastroenterology

## 2018-10-05 ENCOUNTER — Other Ambulatory Visit: Payer: Self-pay

## 2018-10-05 ENCOUNTER — Ambulatory Visit: Payer: Medicare Other | Admitting: Licensed Clinical Social Worker

## 2018-10-14 ENCOUNTER — Telehealth: Payer: Self-pay

## 2018-10-14 NOTE — Telephone Encounter (Signed)
-----   Message from Virgel Manifold, MD sent at 10/05/2018  2:17 PM EDT ----- Jackelyn Poling please let patient know, the biopsies from her esophagus were benign and did not show a reason for her difficulty swallowing.  I would recommend an esophageal manometry study.  Please go ahead and refer her for one if she is agreeable.  I also recommended repeat colonoscopy with a 2-day prep within 3 months due to the poor prep on her last procedure.

## 2018-10-14 NOTE — Telephone Encounter (Signed)
Pt notified via mychart regarding her procedure results.

## 2018-10-18 ENCOUNTER — Other Ambulatory Visit: Payer: Self-pay

## 2018-10-18 ENCOUNTER — Encounter: Payer: Self-pay | Admitting: Licensed Clinical Social Worker

## 2018-10-18 ENCOUNTER — Ambulatory Visit (INDEPENDENT_AMBULATORY_CARE_PROVIDER_SITE_OTHER): Payer: Medicare Other | Admitting: Licensed Clinical Social Worker

## 2018-10-18 DIAGNOSIS — F4310 Post-traumatic stress disorder, unspecified: Secondary | ICD-10-CM | POA: Diagnosis not present

## 2018-10-18 NOTE — Progress Notes (Signed)
Virtual Visit via Telephone Note  I connected with Cheyenne Gray on 10/18/18 at  1:30 PM EDT by telephone and verified that I am speaking with the correct person using two identifiers.   I discussed the limitations, risks, security and privacy concerns of performing an evaluation and management service by telephone and the availability of in person appointments. I also discussed with the patient that there may be a patient responsible charge related to this service. The patient expressed understanding and agreed to proceed.    I discussed the assessment and treatment plan with the patient. The patient was provided an opportunity to ask questions and all were answered. The patient agreed with the plan and demonstrated an understanding of the instructions.   The patient was advised to call back or seek an in-person evaluation if the symptoms worsen or if the condition fails to improve as anticipated.  I provided 35 minutes of non-face-to-face time during this encounter.   Alden Hipp, LCSW    THERAPIST PROGRESS NOTE  Session Time: 1310  Participation Level: Active  Behavioral Response: CasualAlertDepressed  Type of Therapy: Individual Therapy  Treatment Goals addressed: Coping  Interventions: Supportive  Summary: Cheyenne Gray is a 53 y.o. female who presents with continued symptoms related to her diagnosis. Cheyenne Gray reports doing well since our last session, but noted she is experiencing depressive symptoms currently. She reports, "I took my daughter back to college yesterday, and I was kind of happy to drop her off. She has been so awful to me since she's been home." Cheyenne Gray went on to discuss her daughter's behavior over the last few months, and how she has felt the need to walk on egg shells as to not upset her. LCSW validated feelings of frustration and relief around this situation and encouraged Cheyenne Gray to look at how she can improve the situation when [name] returns home. Cheyenne Gray  reported telling her daughter she would have rules next time she came home, "because I'm not going to have her treat me like shit in my own house." LCSW validated this idea and reviewed ways Cheyenne Gray could set boundaries with her daughter that may improve their relationship and communication. Cheyenne Gray expressed understanding and agreement. Cheyenne Gray went on to discuss her relationship with the man in [place], and how he disappeared for a month and a half and stated he was in the hospital. Cheyenne Gray reported feeling confused, "I just don't understand why he would lie, but he still won't facetime me or anything like that." LCSW encouraged Cheyenne Gray to review things/red flags she may have missed in the early stages of their communication, and encouraged her to trust her instinct.   Suicidal/Homicidal: No  Therapist Response: Cheyenne Gray continues to work towards her tx goals but has not yet reached them. We will continue to work on emotional regulation skills moving forward.   Plan: Return again in 4 weeks.  Diagnosis: Axis I: Post Traumatic Stress Disorder    Axis II: No diagnosis    Alden Hipp, LCSW 10/18/2018

## 2018-10-19 ENCOUNTER — Ambulatory Visit: Payer: Medicare Other | Admitting: Gastroenterology

## 2018-10-21 ENCOUNTER — Ambulatory Visit: Payer: Medicare Other | Admitting: Internal Medicine

## 2018-10-25 ENCOUNTER — Encounter: Payer: Self-pay | Admitting: *Deleted

## 2018-10-25 ENCOUNTER — Ambulatory Visit (INDEPENDENT_AMBULATORY_CARE_PROVIDER_SITE_OTHER): Payer: Medicare Other | Admitting: Gastroenterology

## 2018-10-25 ENCOUNTER — Other Ambulatory Visit: Payer: Self-pay

## 2018-10-25 DIAGNOSIS — D508 Other iron deficiency anemias: Secondary | ICD-10-CM

## 2018-10-26 ENCOUNTER — Institutional Professional Consult (permissible substitution): Payer: Medicare Other | Admitting: Plastic Surgery

## 2018-10-28 ENCOUNTER — Other Ambulatory Visit: Payer: Self-pay | Admitting: Oncology

## 2018-11-01 ENCOUNTER — Inpatient Hospital Stay: Payer: Medicare Other

## 2018-11-02 ENCOUNTER — Telehealth: Payer: Self-pay | Admitting: Gastroenterology

## 2018-11-02 NOTE — Telephone Encounter (Signed)
Patient called & states she missed appointment with Dr T. She would like to know does she need to schedule an appointment or can she just reschedule the colonoscopy? Please advise.

## 2018-11-03 ENCOUNTER — Ambulatory Visit: Payer: Medicare PPO | Admitting: Oncology

## 2018-11-03 ENCOUNTER — Inpatient Hospital Stay: Payer: Medicare Other

## 2018-11-03 ENCOUNTER — Encounter: Payer: Self-pay | Admitting: Oncology

## 2018-11-03 ENCOUNTER — Inpatient Hospital Stay: Payer: Medicare Other | Admitting: Oncology

## 2018-11-03 ENCOUNTER — Other Ambulatory Visit: Payer: Medicare PPO

## 2018-11-09 ENCOUNTER — Encounter: Payer: Self-pay | Admitting: Licensed Clinical Social Worker

## 2018-11-09 ENCOUNTER — Other Ambulatory Visit: Payer: Self-pay

## 2018-11-09 ENCOUNTER — Ambulatory Visit (INDEPENDENT_AMBULATORY_CARE_PROVIDER_SITE_OTHER): Payer: Medicare Other | Admitting: Licensed Clinical Social Worker

## 2018-11-09 DIAGNOSIS — F4310 Post-traumatic stress disorder, unspecified: Secondary | ICD-10-CM

## 2018-11-09 NOTE — Progress Notes (Signed)
Virtual Visit via Telephone Note  I connected with Cheyenne Gray on 11/09/18 at  1:30 PM EDT by telephone and verified that I am speaking with the correct person using two identifiers.   I discussed the limitations, risks, security and privacy concerns of performing an evaluation and management service by telephone and the availability of in person appointments. I also discussed with the patient that there may be a patient responsible charge related to this service. The patient expressed understanding and agreed to proceed.   I discussed the assessment and treatment plan with the patient. The patient was provided an opportunity to ask questions and all were answered. The patient agreed with the plan and demonstrated an understanding of the instructions.   The patient was advised to call back or seek an in-person evaluation if the symptoms worsen or if the condition fails to improve as anticipated.  I provided 40 minutes of non-face-to-face time during this encounter.   Alden Hipp, LCSW    THERAPIST PROGRESS NOTE  Session Time: 1300  Participation Level: Active  Behavioral Response: NAAlertDepressed  Type of Therapy: Individual Therapy  Treatment Goals addressed: Anxiety  Interventions: Supportive  Summary: Cheyenne Gray is a 53 y.o. female who presents with continued symptoms related to her diagnosis. Cheyenne Gray reports doing well since our last session, though noted her depression has increased. She reported she plans to speak with the MD in the clinic about adjusting medications. LCSW highlighted the way Cheyenne Gray is able to advocate for herself and encouraged her to be very open with MD. Cheyenne Gray expressed agreement and understanding. Cheyenne Gray stated she has ongoing tension with her daughter, though her  Daughter recently noted it was due to a man being at their house frequently that she does not like. LCSW encouraged Cheyenne Gray to discuss this with her daughter in more detail, and find out if  there is more going on regarding the person in the house. Cheyenne Gray expressed understanding and agreement. She also reported feeling very anxious about the man in [place] and how to handle that situation. LCSW encouraged Cheyenne Gray to trust her instincts. If she can't fully invest into the relationship, that is information. If she feels uneasy about this man for some reason, that is also information. Cheyenne Gray was able to accept this information from LCSW and expressed she would spend time thinking about how she feels.   Suicidal/Homicidal: No  Therapist Response: Cheyenne Gray continues to work towards her tx goals but has not yet reached them. We will continue to work on emotional regulation skills and improving communication moving forward.   Plan: Return again in 3 weeks.  Diagnosis: Axis I: Post Traumatic Stress Disorder    Axis II: No diagnosis    Alden Hipp, LCSW 11/09/2018

## 2018-11-11 ENCOUNTER — Encounter: Payer: Self-pay | Admitting: Plastic Surgery

## 2018-11-12 ENCOUNTER — Institutional Professional Consult (permissible substitution): Payer: Medicare Other | Admitting: Plastic Surgery

## 2018-11-15 NOTE — Telephone Encounter (Signed)
Appt scheduled for 11/25/2018.

## 2018-11-16 ENCOUNTER — Encounter: Payer: Self-pay | Admitting: Internal Medicine

## 2018-11-17 ENCOUNTER — Telehealth: Payer: Self-pay | Admitting: Internal Medicine

## 2018-11-17 NOTE — Telephone Encounter (Signed)
Left message for patient to return call back. [facility] may give and obtain information.  

## 2018-11-17 NOTE — Telephone Encounter (Signed)
Brock call pt to schedule B12 shot x 1 here  S/p gastric bypass and wants shot instead of pill   [place]

## 2018-11-17 NOTE — Telephone Encounter (Signed)
Called patient to schedule B12 injection.  No answer.  LMTCB.

## 2018-11-22 NOTE — Telephone Encounter (Signed)
Please let her know that Dr [place] is not in the office.  Dr Kelly Services had recommended oral B12.  Can start 1094mcg (B12) q day.  Will need to follow levels.

## 2018-11-24 ENCOUNTER — Other Ambulatory Visit: Payer: Self-pay

## 2018-11-24 ENCOUNTER — Encounter: Payer: Self-pay | Admitting: Psychiatry

## 2018-11-24 ENCOUNTER — Ambulatory Visit (INDEPENDENT_AMBULATORY_CARE_PROVIDER_SITE_OTHER): Payer: Medicare Other | Admitting: Psychiatry

## 2018-11-24 DIAGNOSIS — I2510 Atherosclerotic heart disease of native coronary artery without angina pectoris: Secondary | ICD-10-CM

## 2018-11-24 DIAGNOSIS — F331 Major depressive disorder, recurrent, moderate: Secondary | ICD-10-CM

## 2018-11-24 DIAGNOSIS — F411 Generalized anxiety disorder: Secondary | ICD-10-CM

## 2018-11-24 DIAGNOSIS — F5105 Insomnia due to other mental disorder: Secondary | ICD-10-CM

## 2018-11-24 DIAGNOSIS — F1590 Other stimulant use, unspecified, uncomplicated: Secondary | ICD-10-CM

## 2018-11-24 DIAGNOSIS — F4310 Post-traumatic stress disorder, unspecified: Secondary | ICD-10-CM

## 2018-11-24 DIAGNOSIS — Z9189 Other specified personal risk factors, not elsewhere classified: Secondary | ICD-10-CM

## 2018-11-24 MED ORDER — DULOXETINE HCL 30 MG PO CPEP: 90.0000 mg | ORAL_CAPSULE | Freq: Every day | ORAL | 0 refills | Status: DC

## 2018-11-24 MED ORDER — BUSPIRONE HCL 15 MG PO TABS: 15.0000 mg | ORAL_TABLET | Freq: Two times a day (BID) | ORAL | 0 refills | Status: DC

## 2018-11-24 MED ORDER — ARIPIPRAZOLE 2 MG PO TABS: 1.0000 mg | ORAL_TABLET | Freq: Every day | ORAL | 1 refills | Status: DC

## 2018-11-24 NOTE — Progress Notes (Signed)
Virtual Visit via Video Note  I connected with Cheyenne Gray on 11/24/18 at 10:00 AM EDT by a video enabled telemedicine application and verified that I am speaking with the correct person using two identifiers.   I discussed the limitations of evaluation and management by telemedicine and the availability of in person appointments. The patient expressed understanding and agreed to proceed.   I discussed the assessment and treatment plan with the patient. The patient was provided an opportunity to ask questions and all were answered. The patient agreed with the plan and demonstrated an understanding of the instructions.   The patient was advised to call back or seek an in-person evaluation if the symptoms worsen or if the condition fails to improve as anticipated.   [facility] MD OP Progress Note  11/24/2018 12:33 PM Monda MADELENE KAATZ  MRN:  [id number]  Chief Complaint:  Chief Complaint    Follow-up     HPI: Cheyenne Gray is a 53 year old Caucasian female, widowed, on disability, has a history of PTSD, MDD, GAD, insomnia, caffeine use disorder, history of prolonged QT, chronic back pain on methadone, vitamin B12 deficiency, heart failure, iron deficiency, vitamin D deficiency, history of gastric bypass, non-ST elevation MI, hypertension was evaluated by telemedicine today.  Patient today reports that her father passed away recently.  She reports he had underlying health problems as well as had COVID 19 infection.  She reports her daughter currently lives with her friends and roommates in an apartment.  She reports she hence feels extremely isolated and depressed.  Her depression has been getting worse since the past few weeks.  She reports feeling sad, having no energy as well as sleep problems.  She denies any suicidality, homicidality or perceptual disturbances.  Patient reports she tried going up on the Cymbalta to the 90 mg.  She reports it does not make her nauseous anymore and she was able to find out  that 1 of her other medication was doing it.  She reports in spite of doing so she continues to feel no effect from the Cymbalta at the higher dosage.  She reports she does have Belsomra for her sleep however she is worried since she is alone in her house to sleep through the night and take her medications as well.  She hence has not been taking the Belsomra regularly.  Patient continues to work on cutting back on caffeine.  Discussed adding Abilify to her Cymbalta.  She reports she wants to stay on the Cymbalta and does not want to change it to another antidepressant yet.  However discussed with her the history of QT prolongation in her medical history and to make sure she go to her cardiologist to get an EKG soon.  Discussed with her the effect of medications like Abilify on her QT syndrome.  Patient denies any other concerns today. Visit Diagnosis:    ICD-10-CM   1. PTSD (post-traumatic stress disorder)  F43.10 ARIPiprazole (ABILIFY) 2 MG tablet    DULoxetine (CYMBALTA) 30 MG capsule    busPIRone (BUSPAR) 15 MG tablet  2. GAD (generalized anxiety disorder)  F41.1 DULoxetine (CYMBALTA) 30 MG capsule    busPIRone (BUSPAR) 15 MG tablet  3. MDD (major depressive disorder), recurrent episode, moderate (HCC)  F33.1 ARIPiprazole (ABILIFY) 2 MG tablet    DULoxetine (CYMBALTA) 30 MG capsule  4. Insomnia due to mental condition  F51.05   5. 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 At risk for long QT syndrome  Z91.89 EKG  12-Lead    Past Psychiatric History: I have reviewed past psychiatric history from my progress note on 11/06/2017.  Past trials of Paxil, Cymbalta, Valium, melatonin, trazodone, Ambien, Wellbutrin  Past Medical History:  Past Medical History:  Diagnosis Date  . (HFpEF) heart failure with preserved ejection fraction ([place])    a. Echo 2014: EF 65-70%, nl WM, mildly dilated LA, PASP nl; b. 12/2014 Echo: EF 65-70%, no rwma, mod septal hypertrophy w/o LVOT gradient or SAM; c. 07/2017 Echo:  EF 55-60%, no rwma, mildly dil RV w/ nl syst fxn. Mildly dil RA. Dilated IVC w/ elevated CVP. Triv post effusion.  Marland Kitchen Anxiety   . Arthritis   . Asthma   . B12 deficiency   . Chronic back pain   . Chronic headaches   . Chronic pain    a. on methadone  . Concussion    hx of 4  . Coronary artery disease, non-occlusive    a. LHC 1/18: proximal to mid LAD 40% stenosed, mid LAD 30% stenosed, mid RCA 20% stenosed, distal RCA 20% stenosed, EF 55-65%, LVEDP normal  . Depression   . DJD (degenerative joint disease), multiple sites   . Frequent headaches   . Gallstone   . H/O non-ST elevation myocardial infarction (NSTEMI)   . Hand, foot and mouth disease 2016  . History of shingles   . Hypertension   . Iron deficiency anemia   . Long QT interval   . Methadone use ([place])    managed by Dr. Primus Bravo  . Migraine    1x/mo  . Motion sickness    cars  . Obesity   . Palpitations    a. 24 hour Holter: NSR, sinus brady down to 48, occasional PVCs & couplets, 8 beats NSVT; b. 30 day event monitor 2015: NSR with rare PVC.  Marland Kitchen Psoriasis   . Syncope and collapse   . Vitamin D deficiency   . Wears dentures    full upper and lower    Past Surgical History:  Procedure Laterality Date  . ABDOMINOPLASTY     tummy tuck ? year   . [place] SURGERY  2001  . CARDIAC CATHETERIZATION Left 04/16/2016   Procedure: Left Heart Cath and Coronary Angiography;  Surgeon: [name], MD;  Location: [facility];  Service: Cardiovascular;  Laterality: Left;  . CHOLECYSTECTOMY  2001  . COLONOSCOPY WITH PROPOFOL N/A 09/28/2018   Procedure: COLONOSCOPY WITH PROPOFOL;  Surgeon: Virgel Manifold, MD;  Location: [facility];  Service: Endoscopy;  Laterality: N/A;  . ESOPHAGOGASTRODUODENOSCOPY (EGD) WITH PROPOFOL N/A 09/28/2018   Procedure: ESOPHAGOGASTRODUODENOSCOPY (EGD) WITH BIOPSY;  Surgeon: Virgel Manifold, MD;  Location: [facility];  Service: Endoscopy;  Laterality: N/A;  .  GALLBLADDER SURGERY    . GASTRIC BYPASS  2001  . GASTROPLASTY      Family Psychiatric History: Reviewed family history from my progress note on 11/06/2017  Family History:  Family History  Problem Relation Age of Onset  . Heart attack Mother   . Cancer Mother        pancreatitic   . Early death Mother   . Heart attack Father 51       MI  . Early death Father   . Heart disease Father   . Heart attack Brother   . Heart disease Brother   . Arthritis Brother   . Depression Brother   . Diabetes Brother   . Heart attack Maternal Grandmother   . Cancer  Maternal Grandmother        pancreatitic   . Heart disease Maternal Grandmother   . Cancer Maternal Uncle        pancreatitic   . Cancer Paternal Grandmother        ? type   . Diabetes Paternal Grandmother   . Cancer Maternal Uncle        pancreatitic   . Breast cancer Maternal Aunt     Social History: Reviewed social history from my progress note on 11/06/2017 Social History   Socioeconomic History  . Marital status: Widowed    Spouse name: Not on file  . Number of children: 1  . Years of education: assoc degree  . Highest education level: Associate degree: occupational, Hotel manager, or vocational program  Occupational History  . Not on file  Social Needs  . Financial resource strain: Very hard  . Food insecurity    Worry: Never true    Inability: Never true  . Transportation needs    Medical: No    Non-medical: No  Tobacco Use  . Smoking status: Never Smoker  . Smokeless tobacco: Never Used  Substance and Sexual Activity  . Alcohol use: No    Alcohol/week: 0.0 standard drinks    Comment: holidays  . Drug use: No  . Sexual activity: Not Currently  Lifestyle  . Physical activity    Days per week: 0 days    Minutes per session: 0 min  . Stress: Very much  Relationships  . Social Herbalist on phone: Once a week    Gets together: Never    Attends religious service: Never    Active member of club  or organization: No    Attends meetings of clubs or organizations: Never    Relationship status: Widowed  Other Topics Concern  . 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 Penicillins Hives, Shortness Of Breath and Rash    Has patient had a PCN reaction causing immediate rash, facial/tongue/throat swelling, SOB or lightheadedness with hypotension: Yes Has patient had a PCN reaction causing severe rash involving mucus membranes or skin necrosis: No Has patient had a PCN reaction that required hospitalization No Has patient had a PCN reaction occurring within the last 10 years: No If all of the above answers are "NO", then may proceed with Cephalosporin use.     Metabolic Disorder Labs: Lab Results  Component Value Date   HGBA1C 5.3 09/01/2018   No results found for: PROLACTIN Lab Results  Component Value Date   CHOL 176 09/01/2018   TRIG 113.0 09/01/2018   HDL 57.90 09/01/2018   CHOLHDL 3 09/01/2018   VLDL 22.6 09/01/2018   [place] 95 09/01/2018   LDLCALC 47 10/02/2016   Lab Results  Component Value Date   TSH 2.63 09/01/2018   TSH 3.037 07/26/2017    Therapeutic Level Labs: No results found for: LITHIUM No results found for: VALPROATE No components found for:  CBMZ  Current Medications: Current Outpatient Medications  Medication Sig Dispense Refill  . albuterol (PROVENTIL HFA;VENTOLIN HFA) 108 (90 BASE) MCG/ACT inhaler Inhale 2 puffs into the lungs as needed for wheezing.    Marland Kitchen  ARIPiprazole (ABILIFY) 2 MG tablet Take 0.5 tablets (1 mg total) by mouth daily. 15 tablet 1  . BLACK COHOSH PO Take by mouth daily.    . busPIRone (BUSPAR) 15 MG tablet Take 1 tablet (15 mg total) by mouth 2 (two) times daily. 180 tablet 0  . Cholecalciferol  (VITAMIN D3) 125 MCG (5000 UT) TABS Take by mouth daily.    . diclofenac sodium (VOLTAREN) 1 % GEL Apply 4 g topically 4 (four) times daily. Prn knees and 2 grams qid prn hands arthritis 100 g 11  . DULoxetine (CYMBALTA) 30 MG capsule Take 3 capsules (90 mg total) by mouth daily. 270 capsule 0  . DULoxetine (CYMBALTA) 60 MG capsule TAKE 1 CAPSULE BY MOUTH EVERY DAY 90 capsule 0  . furosemide (LASIX) 40 MG tablet     . gabapentin (NEURONTIN) 400 MG capsule Limit 2 tablets in the a.m. and midday and 3 tablets each evening    Please dispense a three-month supply 630 capsule 0  . isosorbide mononitrate (IMDUR) 60 MG 24 hr tablet Take 1 tablet (60 mg total) by mouth daily. 90 tablet 3  . methadone (DOLOPHINE) 10 MG tablet Limit 1-2 tablets by mouth 2-3 times per day if tolerated 180 tablet 0  . nitroGLYCERIN (NITROSTAT) 0.4 MG SL tablet Place 1 tablet (0.4 mg total) under the tongue every 5 (five) minutes as needed for chest pain. 25 tablet 3  . nystatin cream (MYCOSTATIN) APPLY TO CONNER OF MOUTH 2 4 TIMES DAILY    . rOPINIRole (REQUIP) 0.25 MG tablet Take 2-3 tablets (0.5-0.75 mg total) by mouth at bedtime. 90 tablet 11  . rosuvastatin (CRESTOR) 10 MG tablet TAKE 1 TABLET BY MOUTH EVERY DAY 90 tablet 1  . SUMAtriptan (IMITREX) 100 MG tablet TAKE 1 TABLET BY MOUTH AS NEEDED FOR HEADACHE. MAY REPEAT IN 2 HRS IF NEEDED. MAX 200MG/DAY  5  . Suvorexant (BELSOMRA) 5 MG TABS Take 5 mg by mouth at bedtime. 30 tablet 1  . tiZANidine (ZANAFLEX) 2 MG tablet      No current facility-administered medications for this visit.      Musculoskeletal: Strength & Muscle Tone: UTA Gait & Station: normal Patient leans: N/A  Psychiatric Specialty Exam: Review of Systems  Psychiatric/Behavioral: Positive for depression. The patient is nervous/anxious and has insomnia.   All other systems reviewed and are negative.   Last menstrual period 02/18/2016.There is no height or weight on file to calculate BMI.  General  Appearance: Casual  Eye Contact:  Fair  Speech:  Normal Rate  Volume:  Normal  Mood:  Anxious and Depressed  Affect:  Appropriate  Thought Process:  Goal Directed and Descriptions of Associations: Intact  Orientation:  Full (Time, Place, and Person)  Thought Content: Logical   Suicidal Thoughts:  No  Homicidal Thoughts:  No  Memory:  Immediate;   Fair Recent;   Fair Remote;   Fair  Judgement:  Fair  Insight:  Fair  Psychomotor Activity:  Normal  Concentration:  Concentration: Good and Attention Span: Good  Recall:  [employer] of Knowledge: Fair  Language: Good  Akathisia:  No  Handed:  Right  AIMS (if indicated): Denies tremors, rigidity  Assets:  Communication Skills Desire for Improvement Social Support  ADL's:  Intact  Cognition: WNL  Sleep:  Poor   Screenings: GAD-7     Office Visit from 09/21/2015 in [facility]  Total GAD-7 Score  4    Mini-Mental  Office Visit from 10/30/2017 in [facility]  Total Score (max 30 points )  27    PHQ2-9     Office Visit from 08/18/2017 in [facility] Office Visit from 11/15/2015 in [facility] Office Visit from 10/18/2015 in [facility] Office Visit from 09/21/2015 in [facility] Office Visit from 07/25/2015 in [facility]  PHQ-2 Total Score  0  0  0  0  0       Assessment and Plan: Bular is a 53 year old Caucasian female, widowed, has a history of PTSD, depression, anxiety, multiple medical problems including coronary artery disease, history of STEMI, long QT per history, gastric bypass, vitamin D and vitamin B12 deficiency, chronic pain on methadone was evaluated by telemedicine today.  Patient is biologically predisposed given her multiple medical problems, trauma, family history of  mental health problems.  Patient reports she is currently struggling with depression.  She does have psychosocial stressors of the recent death of her father.  Patient will benefit from medication readjustment as well as psychotherapy sessions.  Plan PTSD- stable Cymbalta as prescribed BuSpar 15 mg p.o. 3 times daily Continue psychotherapy with therapist.  MDD-unstable Cymbalta increased to 90 mg p.o. daily. Add Abilify 1 mg p.o. daily Continue CBT  For GAD-improving BuSpar and Cymbalta as prescribed  Insomnia-unstable Belsomra 5 mg p.o. nightly. Patient however has been noncompliant since she is alone at home, reports she does not want to take anything that knocks her out. I have reviewed [place] controlled substance database  Caffeine use disorder-improving Patient is cutting back.  At risk for QT syndrome- discussed with patient to get an EKG since Abilify is started.  Follow-up in clinic in 3 to 4 weeks or sooner if needed.  October 8 at 10:30 AM  I have spent atleast  25 minutes non face to face with patient today. More than 50 % of the time was spent for psychoeducation and supportive psychotherapy and care coordination. This note was generated in part or whole with voice recognition software. Voice recognition is usually quite accurate but there are transcription errors that can and very often do occur. I apologize for any typographical errors that were not detected and corrected.       Ursula Alert, MD 11/24/2018, 12:33 PM

## 2018-11-25 ENCOUNTER — Inpatient Hospital Stay: Payer: Medicare Other | Attending: Oncology

## 2018-11-25 ENCOUNTER — Encounter: Payer: Self-pay | Admitting: Gastroenterology

## 2018-11-25 ENCOUNTER — Other Ambulatory Visit: Payer: Self-pay

## 2018-11-25 ENCOUNTER — Ambulatory Visit (INDEPENDENT_AMBULATORY_CARE_PROVIDER_SITE_OTHER): Payer: Medicare Other | Admitting: Gastroenterology

## 2018-11-25 VITALS — BP 139/88 | HR 66 | Temp 98.4°F | Wt 229.5 lb

## 2018-11-25 DIAGNOSIS — Z1212 Encounter for screening for malignant neoplasm of rectum: Secondary | ICD-10-CM

## 2018-11-25 DIAGNOSIS — K5900 Constipation, unspecified: Secondary | ICD-10-CM

## 2018-11-25 DIAGNOSIS — R1310 Dysphagia, unspecified: Secondary | ICD-10-CM

## 2018-11-25 DIAGNOSIS — Z9884 Bariatric surgery status: Secondary | ICD-10-CM | POA: Diagnosis not present

## 2018-11-25 DIAGNOSIS — Z1211 Encounter for screening for malignant neoplasm of colon: Secondary | ICD-10-CM | POA: Diagnosis not present

## 2018-11-25 DIAGNOSIS — D508 Other iron deficiency anemias: Secondary | ICD-10-CM | POA: Insufficient documentation

## 2018-11-25 DIAGNOSIS — R1319 Other dysphagia: Secondary | ICD-10-CM

## 2018-11-25 DIAGNOSIS — D529 Folate deficiency anemia, unspecified: Secondary | ICD-10-CM

## 2018-11-25 LAB — CBC WITH DIFFERENTIAL/PLATELET
Abs Immature Granulocytes: 0.02 10*3/uL (ref 0.00–0.07)
Basophils Absolute: 0.1 10*3/uL (ref 0.0–0.1)
Basophils Relative: 1 %
Eosinophils Absolute: 0.2 10*3/uL (ref 0.0–0.5)
Eosinophils Relative: 3 %
HCT: 41.6 % (ref 36.0–46.0)
Hemoglobin: 13.8 g/dL (ref 12.0–15.0)
Immature Granulocytes: 0 %
Lymphocytes Relative: 36 %
Lymphs Abs: 2.6 10*3/uL (ref 0.7–4.0)
MCH: 31.2 pg (ref 26.0–34.0)
MCHC: 33.2 g/dL (ref 30.0–36.0)
MCV: 93.9 fL (ref 80.0–100.0)
Monocytes Absolute: 0.6 10*3/uL (ref 0.1–1.0)
Monocytes Relative: 8 %
Neutro Abs: 3.8 10*3/uL (ref 1.7–7.7)
Neutrophils Relative %: 52 %
Platelets: 200 10*3/uL (ref 150–400)
RBC: 4.43 MIL/uL (ref 3.87–5.11)
RDW: 12.1 % (ref 11.5–15.5)
WBC: 7.2 10*3/uL (ref 4.0–10.5)
nRBC: 0 % (ref 0.0–0.2)

## 2018-11-25 LAB — VITAMIN B12: Vitamin B-12: 441 pg/mL (ref 180–914)

## 2018-11-25 LAB — IRON AND TIBC
Iron: 60 ug/dL (ref 28–170)
Saturation Ratios: 20 % (ref 10.4–31.8)
TIBC: 307 ug/dL (ref 250–450)
UIBC: 247 ug/dL

## 2018-11-25 LAB — FERRITIN: Ferritin: 98 ng/mL (ref 11–307)

## 2018-11-25 LAB — FOLATE: Folate: 13.3 ng/mL (ref >5.9)

## 2018-11-25 MED ORDER — BISACODYL EC 5 MG PO TBEC: DELAYED_RELEASE_TABLET | ORAL | 0 refills | Status: DC

## 2018-11-25 MED ORDER — NA SULFATE-K SULFATE-MG SULF 17.5-3.13-1.6 GM/177ML PO SOLN: 708.0000 mL | Freq: Once | ORAL | 0 refills | Status: AC

## 2018-11-25 MED ORDER — METAMUCIL 0.36 G PO CAPS: 1.0000 "application " | ORAL_CAPSULE | Freq: Every day | ORAL | 0 refills | Status: AC

## 2018-11-25 NOTE — Progress Notes (Signed)
Taelon Bendorf, MD [address]  [address]  [place], [place] Cheyenne [id number]  Main: [phone]  Fax: [phone]   Primary Care Physician: McLean-Scocuzza, Pasty Spillersracy N, MD   Chief Complaint  Patient presents with  . Anemia    Patient has been fatigue constantly. Patient states she has some abdominal pain with constipation and diarrhea. Patient has had nausea    HPI: Cheyenne Gray is a 53 y.o. female here for follow-up of iron deficiency anemia and dysphagia.  Also reports ongoing alternating constipation and diarrhea.  Goes weeks or days without bowel movements and then will have diarrhea.  No weight loss, no blood in stool.  No altered bowel habits.  Patient seeing hematology for iron deficiency anemia and anemia is now resolved with IV iron.  History of gastric bypass and iron deficiency anemia likely due to that.  EGD in July 2020 with hypotonic lower esophageal sphincter noted.  Anastomosis with healthy-appearing mucosa.  Mildly friable mucosa at anastomosis seen. Normal jejunum.  Pathology did not show EOE  Colonoscopy, July 2020 With poor prep.  Repeat recommended in 3 months.  However, no cancers, masses or any findings to explain the CT thickening noted in the transverse colon on the previous CT.  Therefore CT thickening likely nonspecific  Previous history: Reports an ER visit 2018 due to food impaction. ER notes reviewed, did reveal the patient was given glucagon, and felt better, and was discharged from the ER. She had an EGD in 2015 due to melena, anemia and abdominal pain. This was done by Dr. 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 Patient was given sucralfate. Stomach biopsies did not reveal H. Pylori.  Reports history of colonoscopy prior to her gastric bypass 15 or 16 years ago as well. No recent colonoscopies. No immediate family history of colon cancer.  Reports alternating constipation and diarrhea. States has some days  with hard balls of stool, and others with loose stools.  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 albuterol (PROVENTIL HFA;VENTOLIN HFA) 108 (90 BASE) MCG/ACT inhaler Inhale 2 puffs into the lungs as needed for wheezing.    . ARIPiprazole (ABILIFY) 2 MG tablet Take 0.5 tablets (1 mg total) by mouth daily. 15 tablet 1  . BLACK COHOSH PO Take by mouth daily.    . busPIRone (BUSPAR) 15 MG tablet Take 1 tablet (15 mg total) by mouth 2 (two) times daily. 180 tablet 0  . Cholecalciferol (VITAMIN D3) 125 MCG (5000 UT) TABS Take by mouth daily.    . diclofenac sodium (VOLTAREN) 1 % GEL Apply 4 g topically 4 (four) times daily. Prn knees and 2 grams qid prn hands arthritis 100 g 11  . DULoxetine (CYMBALTA) 30 MG capsule Take 3 capsules (90 mg total) by mouth daily. 270 capsule 0  . DULoxetine (CYMBALTA) 60 MG capsule TAKE 1 CAPSULE BY MOUTH EVERY DAY 90 capsule 0  . gabapentin (NEURONTIN) 400 MG capsule Limit 2 tablets in the a.m. and midday and 3 tablets each evening    Please dispense a three-month supply 630 capsule 0  . methadone (DOLOPHINE) 10 MG tablet Limit 1-2 tablets by mouth 2-3 times per day if tolerated 180 tablet 0  . nitroGLYCERIN (NITROSTAT) 0.4 MG SL tablet  Place 1 tablet (0.4 mg total) under the tongue every 5 (five) minutes as needed for chest pain. 25 tablet 3  . nystatin cream (MYCOSTATIN) APPLY TO CONNER OF MOUTH 2 4 TIMES DAILY    . rOPINIRole (REQUIP) 0.25 MG tablet Take 2-3 tablets (0.5-0.75 mg total) by mouth at bedtime. 90 tablet 11  . SUMAtriptan (IMITREX) 100 MG tablet TAKE 1 TABLET BY MOUTH AS NEEDED FOR HEADACHE. MAY REPEAT IN 2 HRS IF NEEDED. MAX 200MG /DAY  5  . Suvorexant (BELSOMRA) 5 MG TABS Take 5 mg by mouth at bedtime.  30 tablet 1  . furosemide (LASIX) 40 MG tablet     . isosorbide mononitrate (IMDUR) 60 MG 24 hr tablet Take 1 tablet (60 mg total) by mouth daily. 90 tablet 3  . rosuvastatin (CRESTOR) 10 MG tablet TAKE 1 TABLET BY MOUTH EVERY DAY (Patient not taking: Reported on 11/25/2018) 90 tablet 1  . tiZANidine (ZANAFLEX) 2 MG tablet      No current facility-administered medications for this visit.     Allergies as of 11/25/2018 - Review Complete 11/25/2018  Allergen Reaction Noted  . Penicillins Hives, Shortness Of Breath, and Rash 07/20/2009    ROS:  General: Negative for anorexia, weight loss, fever, chills, fatigue, weakness. ENT: Negative for hoarseness, difficulty swallowing , nasal congestion. CV: Negative for chest pain, angina, palpitations, dyspnea on exertion, peripheral edema.  Respiratory: Negative for dyspnea at rest, dyspnea on exertion, cough, sputum, wheezing.  GI: See history of present illness. GU:  Negative for dysuria, hematuria, urinary incontinence, urinary frequency, nocturnal urination.  Endo: Negative for unusual weight change.    Physical Examination:   BP 139/88 (BP Location: Left Arm, Patient Position: Sitting, Cuff Size: Large)   Pulse 66   Temp 98.4 F (36.9 C) (Oral)   Wt 229 lb 8 oz (104.1 kg)   LMP 02/18/2016 (Approximate) Comment: neg HCG-03/26/2016  BMI 38.19 kg/m   General: Well-nourished, well-developed in no acute distress.  Eyes: No icterus. Conjunctivae pink. Mouth: Oropharyngeal mucosa moist and pink , no lesions erythema or exudate. Neck: Supple, Trachea midline Abdomen: Bowel sounds are normal, nontender, nondistended, no hepatosplenomegaly or masses, no abdominal bruits or hernia , no rebound or guarding.   Extremities: No lower extremity edema. No clubbing or deformities. Neuro: Alert and oriented x 3.  Grossly intact. Skin: Warm and dry, no jaundice.   Psych: Alert and cooperative, normal mood and affect.   Labs: CMP     Component  Value Date/Time   NA 141 09/01/2018 0935   NA 142 03/28/2016 1519   K 4.1 09/01/2018 0935   CL 104 09/01/2018 0935   CO2 32 09/01/2018 0935   GLUCOSE 81 09/01/2018 0935   BUN 8 09/01/2018 0935   BUN 5 (L) 03/28/2016 1519   CREATININE 0.87 09/01/2018 0935   CREATININE 0.86 08/18/2017 0954   CALCIUM 8.9 09/01/2018 0935   PROT 7.6 07/06/2018 1423   PROT 6.3 05/31/2018 1144   PROT 6.4 05/04/2013 1556   ALBUMIN 4.0 07/06/2018 1423   ALBUMIN 4.0 05/31/2018 1144   ALBUMIN 3.1 (L) 05/04/2013 1556   AST 30 07/06/2018 1423   AST 38 (H) 05/04/2013 1556   ALT 16 07/06/2018 1423   ALT 39 05/04/2013 1556   ALKPHOS 94 07/06/2018 1423   ALKPHOS 84 05/04/2013 1556   BILITOT 0.5 07/06/2018 1423   BILITOT 0.4 05/31/2018 1144   BILITOT 0.3 05/04/2013 1556   GFRNONAA >60 07/06/2018 1423   GFRNONAA 79 08/18/2017 0954  GFRAA >60 07/06/2018 1423   GFRAA 92 08/18/2017 0954   Lab Results  Component Value Date   WBC 7.2 11/25/2018   HGB 13.8 11/25/2018   HCT 41.6 11/25/2018   MCV 93.9 11/25/2018   PLT 200 11/25/2018    Imaging Studies: No results found.  Assessment and Plan:   TAHIYA CARUSONE is a 53 y.o. y/o female here for follow-up of iron deficiency anemia and dysphagia  No etiology of iron deficiency anemia evident on EGD and colonoscopy  Likely etiology is her gastric bypass status  Focal mural thickening noted in the transverse colon on CT did not correlate with any findings of colonoscopy, therefore CT findings were likely nonspecific  However, patient needs repeat colonoscopy due to poor prep on last procedure to rule out any underlying polyps or small lesions  Patient is agreeable to scheduling this in October  Continue follow-up with hematology  EGD results explained and patient agreeable to manometry for evaluation of dysphagia and possible hypertensive LES noted on endoscopy  Liver enzymes with normal on repeat testing in April 2020  continue to avoid hepatotoxic  drugs  Start Metamucil daily due to constipation  Dr Cheyenne Gray

## 2018-11-25 NOTE — Progress Notes (Signed)
Patient is coming in for follow up, she is not doing well only eats once a week. She lives alone.  She feels very exhausted. Had no period for yers then just recently started having periods.

## 2018-11-26 ENCOUNTER — Inpatient Hospital Stay: Payer: Medicare Other | Admitting: Oncology

## 2018-11-26 ENCOUNTER — Inpatient Hospital Stay: Payer: Medicare Other

## 2018-12-01 ENCOUNTER — Encounter: Payer: Self-pay | Admitting: Oncology

## 2018-12-01 ENCOUNTER — Ambulatory Visit (INDEPENDENT_AMBULATORY_CARE_PROVIDER_SITE_OTHER): Payer: Medicare Other | Admitting: Licensed Clinical Social Worker

## 2018-12-01 ENCOUNTER — Other Ambulatory Visit: Payer: Self-pay

## 2018-12-01 ENCOUNTER — Encounter: Payer: Self-pay | Admitting: Licensed Clinical Social Worker

## 2018-12-01 DIAGNOSIS — F4310 Post-traumatic stress disorder, unspecified: Secondary | ICD-10-CM | POA: Diagnosis not present

## 2018-12-01 NOTE — Progress Notes (Signed)
Virtual Visit via Video Note  I connected with Cheyenne Gray on 12/01/18 at  2:30 PM EDT by a video enabled telemedicine application and verified that I am speaking with the correct person using two identifiers.   I discussed the limitations of evaluation and management by telemedicine and the availability of in person appointments. The patient expressed understanding and agreed to proceed.    I discussed the assessment and treatment plan with the patient. The patient was provided an opportunity to ask questions and all were answered. The patient agreed with the plan and demonstrated an understanding of the instructions.   The patient was advised to call back or seek an in-person evaluation if the symptoms worsen or if the condition fails to improve as anticipated.  I provided 47 minutes of non-face-to-face time during this encounter.   Alden Hipp, LCSW    THERAPIST PROGRESS NOTE  Session Time: 1430  Participation Level: Active  Behavioral Response: CasualAlertDepressed  Type of Therapy: Individual Therapy  Treatment Goals addressed: Coping  Interventions: Supportive  Summary: Cheyenne Gray is a 53 y.o. female who presents with continued symptoms related to her diagnosis. Cheyenne Gray reports doing not doing well since our last session, "everything kind of went to hell on Monday." Cheyenne Gray reports her boyfriend who lived in [place], "opened up a checking account in my name, because he said his boss couldn't wire the money to him directly for some reason. So, I was supposed to transfer the money to him from that account. Come to find out, it was fraud, and was someone else's bank account and the whole thing was bullshit." LCSW validated Cheyenne Gray's feelings of anger, hurt, and sadness around this situation. We discussed how Cheyenne Gray always gives everyone the benefit of the doubt, and how that is actually quite a beautiful quality. Cheyenne Gray was able to see this but was too upset to agree with LCSW  on this point. Cheyenne Gray went on to discuss her daughter. She stated the bank will be taking back the money she transferred, and if she does not have it in her account, they will take it from her daughter's because the two are linked. She reported talking to her daughter about this, who initially reacted kindly and offered to come home. However, her mood shifted when she wrote her mother a letter explaining she can no longer "worry about her all the time." Marnita was tearful while discussing this event with her daughter, and stated, "I just don't want her in my life anymore if she's going to treat me like this." LCSW validated Sanjana's feelings, but encouraged her to take a moment to process everything before making declarations about her daughter being a part of her life. Cheyenne Gray was able to understand this and stated she planned to do so. LCSW held space for Cheyenne Gray to discuss her feelings, her thoughts, and her plans around these two events.   Suicidal/Homicidal: No  Therapist Response: Cheyenne Gray continues to work towards her tx goals but has not yet reached them. We will continue to work on improving emotional regulation skills moving forward.   Plan: Return again in 4 weeks. (first available appointment)   Diagnosis: Axis I: Post Traumatic Stress Disorder    Axis II: No diagnosis    Alden Hipp, LCSW 12/01/2018

## 2018-12-02 ENCOUNTER — Other Ambulatory Visit: Payer: Self-pay

## 2018-12-02 ENCOUNTER — Telehealth: Payer: Self-pay

## 2018-12-02 DIAGNOSIS — D508 Other iron deficiency anemias: Secondary | ICD-10-CM

## 2018-12-02 DIAGNOSIS — D529 Folate deficiency anemia, unspecified: Secondary | ICD-10-CM

## 2018-12-02 NOTE — Telephone Encounter (Signed)
Called [facility] this morning to check on patient referral for the esophageal Manometry. They state they are ready to scheduled this procedure. They have tried to call her, sent a mychart message and letter. I sent patient a mychart message and left a message for patient to call them back at [phone] and option 2.

## 2018-12-05 ENCOUNTER — Other Ambulatory Visit: Payer: Self-pay | Admitting: Psychiatry

## 2018-12-05 DIAGNOSIS — F411 Generalized anxiety disorder: Secondary | ICD-10-CM

## 2018-12-07 ENCOUNTER — Ambulatory Visit: Payer: Medicare Other | Admitting: Oncology

## 2018-12-07 ENCOUNTER — Ambulatory Visit: Payer: Medicare Other

## 2018-12-14 ENCOUNTER — Telehealth: Payer: Self-pay

## 2018-12-14 NOTE — Telephone Encounter (Signed)
Pt needs refills on   DULoxetine (CYMBALTA) 60 MG capsule Medication Date: 07/30/2018 Department: [facility] Ordering/Authorizing: Ursula Alert, MD  Order Providers  Prescribing Provider Encounter Provider  Ursula Alert, MD Ursula Alert, MD  Outpatient Medication Detail   Disp Refills Start End   DULoxetine (CYMBALTA) 60 MG capsule 90 capsule 0 07/30/2018    Sig: TAKE 1 CAPSULE BY MOUTH EVERY DAY   Sent to pharmacy as: DULoxetine (CYMBALTA) 60 MG capsule   E-Prescribing Status: Receipt confirmed by pharmacy (07/30/2018 10:58 AM EDT)   Associated Diagnoses  GAD (generalized anxiety disorder)     Pharmacy  [id number] [address], [place]

## 2018-12-14 NOTE — Telephone Encounter (Signed)
Proceed treatment request for Cymbalta. However Cymbalta was sent out for 90-day supply on September 9 per review of her chart.

## 2018-12-20 ENCOUNTER — Other Ambulatory Visit: Payer: Self-pay | Admitting: Psychiatry

## 2018-12-20 DIAGNOSIS — F331 Major depressive disorder, recurrent, moderate: Secondary | ICD-10-CM

## 2018-12-20 DIAGNOSIS — F4310 Post-traumatic stress disorder, unspecified: Secondary | ICD-10-CM

## 2018-12-21 ENCOUNTER — Other Ambulatory Visit: Payer: Self-pay

## 2018-12-21 ENCOUNTER — Encounter: Payer: Self-pay | Admitting: Internal Medicine

## 2018-12-21 ENCOUNTER — Ambulatory Visit (INDEPENDENT_AMBULATORY_CARE_PROVIDER_SITE_OTHER): Payer: Medicare Other | Admitting: Internal Medicine

## 2018-12-21 VITALS — Ht 66.0 in | Wt 223.0 lb

## 2018-12-21 DIAGNOSIS — R413 Other amnesia: Secondary | ICD-10-CM

## 2018-12-21 DIAGNOSIS — E669 Obesity, unspecified: Secondary | ICD-10-CM | POA: Diagnosis not present

## 2018-12-21 DIAGNOSIS — L987 Excessive and redundant skin and subcutaneous tissue: Secondary | ICD-10-CM | POA: Diagnosis not present

## 2018-12-21 DIAGNOSIS — Z9884 Bariatric surgery status: Secondary | ICD-10-CM

## 2018-12-21 DIAGNOSIS — Z91030 Bee allergy status: Secondary | ICD-10-CM | POA: Insufficient documentation

## 2018-12-21 MED ORDER — EPINEPHRINE 0.3 MG/0.3ML IJ SOAJ: 0.3000 mg | INTRAMUSCULAR | 0 refills | Status: DC | PRN

## 2018-12-21 NOTE — Progress Notes (Signed)
Pre visit review using our clinic review tool, if applicable. No additional management support is needed unless otherwise documented below in the visit note. 

## 2018-12-21 NOTE — Progress Notes (Signed)
Telephone Note  I connected with Cheyenne Gray   on 12/21/18 at  3:30 PM EDT by telephone and verified that I am speaking with the correct person using two identifiers.  Location patient: home Location provider:work or home office Persons participating in the virtual visit: patient, provider  I discussed the limitations of evaluation and management by telemedicine and the availability of in person appointments. The patient expressed understanding and agreed to proceed.   HPI: 1. C/o depression and memory loss seeing psychiatry Dr. Shea Evans and added a medication Abilify 1 mg daily taking 1/2 pill of 2 mg. Could not sch appt with neurology due to bad debt at [facility] and wants another referral to another location  2. Chronic constipation GI advised she take metamucil daily s/p gastric bypass 19 years ago she gets full fast and wants to try a pill instead for constipation mentioned I will mention Linzess to GI to see if they rec this  3. S/p gastric bypass 19 years ago with obesity wt gain back and excess skin she wants to see Dr. Hinton Dyer Portenier at [facility]  -of note due to gastric bypass pt wants B12 injections Q6 months due not wanting to take pill and poor absorption orally per pt vitamin B12 11/25/18 441   4. Of note needs repeat colonoscopy due to not cleaned out well hard to take prep due to gets full fast s/p gastric bypass EGD 09/28/18 negative bxs for eosinophillic esophagitis.     ROS: See pertinent positives and negatives per HPI.  Past Medical History:  Diagnosis Date  . (HFpEF) heart failure with preserved ejection fraction ([place])    a. Echo 2014: EF 65-70%, nl WM, mildly dilated LA, PASP nl; b. 12/2014 Echo: EF 65-70%, no rwma, mod septal hypertrophy w/o LVOT gradient or SAM; c. 07/2017 Echo: EF 55-60%, no rwma, mildly dil RV w/ nl syst fxn. Mildly dil RA. Dilated IVC w/ elevated CVP. Triv post effusion.  Marland Kitchen Anxiety   . Arthritis   . Asthma   . B12 deficiency   . Chronic back pain   .  Chronic headaches   . Chronic pain    a. on methadone  . Concussion    hx of 4  . Coronary artery disease, non-occlusive    a. LHC 1/18: proximal to mid LAD 40% stenosed, mid LAD 30% stenosed, mid RCA 20% stenosed, distal RCA 20% stenosed, EF 55-65%, LVEDP normal  . Depression   . DJD (degenerative joint disease), multiple sites   . Frequent headaches   . Gallstone   . H/O non-ST elevation myocardial infarction (NSTEMI)   . Hand, foot and mouth disease 2016  . History of shingles   . Hypertension   . Iron deficiency anemia   . Long QT interval   . Methadone use ([facility])    managed by Dr. Primus Bravo  . Migraine    1x/mo  . Motion sickness    cars  . Obesity   . Palpitations    a. 24 hour Holter: NSR, sinus brady down to 48, occasional PVCs & couplets, 8 beats NSVT; b. 30 day event monitor 2015: NSR with rare PVC.  Marland Kitchen Psoriasis   . Syncope and collapse   . Vitamin D deficiency   . Wears dentures    full upper and lower    Past Surgical History:  Procedure Laterality Date  . ABDOMINOPLASTY     tummy tuck ? year   . [place] SURGERY  2001  . CARDIAC CATHETERIZATION  Left 04/16/2016   Procedure: Left Heart Cath and Coronary Angiography;  Surgeon: [name], MD;  Location: [facility];  Service: Cardiovascular;  Laterality: Left;  . CHOLECYSTECTOMY  2001  . COLONOSCOPY WITH PROPOFOL N/A 09/28/2018   Procedure: COLONOSCOPY WITH PROPOFOL;  Surgeon: Virgel Manifold, MD;  Location: [facility];  Service: Endoscopy;  Laterality: N/A;  . ESOPHAGOGASTRODUODENOSCOPY (EGD) WITH PROPOFOL N/A 09/28/2018   Procedure: ESOPHAGOGASTRODUODENOSCOPY (EGD) WITH BIOPSY;  Surgeon: Virgel Manifold, MD;  Location: [facility];  Service: Endoscopy;  Laterality: N/A;  . GALLBLADDER SURGERY    . GASTRIC BYPASS  2001  . GASTROPLASTY      Family History  Problem Relation Age of Onset  . Heart attack Mother   . Cancer Mother        pancreatitic   . Early death Mother    . Heart attack Father 57       MI  . Early death Father   . Heart disease Father   . Heart attack Brother   . Heart disease Brother   . Arthritis Brother   . Depression Brother   . Diabetes Brother   . Heart attack Maternal Grandmother   . Cancer Maternal Grandmother        pancreatitic   . Heart disease Maternal Grandmother   . Cancer Maternal Uncle        pancreatitic   . Cancer Paternal Grandmother        ? type   . Diabetes Paternal Grandmother   . Cancer Maternal Uncle        pancreatitic   . 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  albuterol (PROVENTIL HFA;VENTOLIN HFA) 108 (90 BASE) MCG/ACT inhaler, Inhale 2 puffs into the lungs as needed for wheezing., Disp: , Rfl:  .  ARIPiprazole (ABILIFY) 2 MG tablet, TAKE 0.5 TABLETS (1 MG TOTAL) BY MOUTH DAILY., Disp: 45 tablet, Rfl: 1 .  bisacodyl (BISACODYL) 5 MG EC tablet, Please take 2 tablets (53m) the day before procedure between 1pm and 3pm, Disp: 30 tablet, Rfl: 0 .  BLACK COHOSH PO, Take by mouth daily., Disp: , Rfl:  .  busPIRone (BUSPAR) 15 MG tablet, Take 1 tablet (15 mg total) by mouth 2 (two) times daily., Disp: 180 tablet, Rfl: 0 .  Cholecalciferol (VITAMIN D3) 125 MCG (5000 UT) TABS, Take by mouth daily., Disp: , Rfl:  .  diclofenac sodium (VOLTAREN) 1 % GEL, Apply 4 g topically 4 (four) times daily. Prn knees and 2 grams qid prn hands arthritis, Disp: 100 g, Rfl: 11 .  DULoxetine (CYMBALTA) 30 MG capsule, Take 3 capsules (90 mg total) by mouth daily., Disp: 270 capsule, Rfl: 0 .  furosemide (LASIX) 40 MG tablet, , Disp: , Rfl:  .  gabapentin (NEURONTIN) 400 MG capsule, Limit 2 tablets in the a.m. and midday and 3 tablets each evening    Please dispense a three-month supply, Disp: 630 capsule, Rfl: 0 .  methadone (DOLOPHINE) 10  MG tablet, Limit 1-2 tablets by mouth 2-3 times per day if tolerated, Disp: 180 tablet, Rfl: 0 .  nitroGLYCERIN (NITROSTAT) 0.4 MG SL tablet, Place 1 tablet (0.4 mg total) under the tongue every 5 (five) minutes as needed for chest pain.,  Disp: 25 tablet, Rfl: 3 .  nystatin cream (MYCOSTATIN), APPLY TO CONNER OF MOUTH 2 4 TIMES DAILY, Disp: , Rfl:  .  Psyllium (METAMUCIL) 0.36 g CAPS, Take 1 application by mouth daily., Disp: 90 capsule, Rfl: 0 .  rOPINIRole (REQUIP) 0.25 MG tablet, Take 2-3 tablets (0.5-0.75 mg total) by mouth at bedtime., Disp: 90 tablet, Rfl: 11 .  rosuvastatin (CRESTOR) 10 MG tablet, TAKE 1 TABLET BY MOUTH EVERY DAY, Disp: 90 tablet, Rfl: 1 .  SUMAtriptan (IMITREX) 100 MG tablet, TAKE 1 TABLET BY MOUTH AS NEEDED FOR HEADACHE. MAY REPEAT IN 2 HRS IF NEEDED. MAX 200MG/DAY, Disp: , Rfl: 5 .  Suvorexant (BELSOMRA) 5 MG TABS, Take 5 mg by mouth at bedtime., Disp: 30 tablet, Rfl: 1 .  tiZANidine (ZANAFLEX) 2 MG tablet, , Disp: , Rfl:  .  EPINEPHrine 0.3 mg/0.3 mL IJ SOAJ injection, Inject 0.3 mLs (0.3 mg total) into the muscle as needed for anaphylaxis., Disp: 2 each, Rfl: 0 .  isosorbide mononitrate (IMDUR) 60 MG 24 hr tablet, Take 1 tablet (60 mg total) by mouth daily., Disp: 90 tablet, Rfl: 3  EXAM:  VITALS per patient if applicable:  GENERAL: alert, oriented, appears well and in no acute distress  PSYCH/NEURO: pleasant and cooperative, no obvious depression or anxiety, speech and thought processing grossly intact  ASSESSMENT AND PLAN:  Discussed the following assessment and plan:  Memory loss - Plan: Ambulatory referral to Neurology Dr. Manuella Ghazi to further assess maybe related to uncontrolled depression as well f/u psych Dr. Shea Evans  S/P gastric bypass - Plan: Amb Referral to Bariatric Surgery Dr. Hinton Dyer Portenier wants to see to disc weight gain and excess skin s/p gastric bypass 19 years ago  -of note she would like B12 shots Q6 months in our clinic  -iron infusions with Dr.  Tasia Catchings H/o   Obesity (BMI 30-39.9) BMI 36.01 12/21/2018 - Plan: Amb Referral to Bariatric Surgery  Allergy to honey bee venom - Plan: EPINEPHrine 0.3 mg/0.3 mL IJ SOAJ injection Pt requests epi pen   HM Never gets flu shot  Tdap ? Had 2009/2010 likely due for repeat -pt wants to wait as of 12/21/18 disc again today  consider shingrix in future rec hep B vaccinelow titer 6 08/18/17  Hep A immune   Pap pt has not had in a while wants to think about it if needed consider OB/GYN vs PCPpt wants to [place] as of 12/21/18 and has not had a pap in > 19 years per pt h/o trauma and hard to do paps she will think about this and we will re discuss   Colonoscopy 09/28/18/EGD poor prep   needs repeat colonoscopy done 09/28/18 and repeat sch 01/2019 Dr. Earley Favor GI  EGD 09/28/18 neg bx for eos esophagitis   M[place]   DEXA7/16/19 normal  Of note home sleep studyordered by lung MD Dr. Juanell Fairly never done from 08/2017   consider HIV check in the future   -we discussed possible serious and likely etiologies, options for evaluation and workup, limitations of telemedicine visit vs in person visit, treatment, treatment risks and precautions. Pt prefers to treat via telemedicine empirically rather then risking or undertaking an in person visit at this moment. Patient agrees to seek prompt in person care if worsening, new symptoms arise, or if is not improving with treatment.   I discussed the assessment and treatment plan with the patient. The patient was provided an opportunity to ask questions and all  were answered. The patient agreed with the plan and demonstrated an understanding of the instructions.   The patient was advised to call back or seek an in-person evaluation if the symptoms worsen or if the condition fails to improve as anticipated.  Time spent 15 minutes  Delorise Jackson, MD

## 2018-12-23 ENCOUNTER — Other Ambulatory Visit: Payer: Self-pay

## 2018-12-23 ENCOUNTER — Ambulatory Visit (INDEPENDENT_AMBULATORY_CARE_PROVIDER_SITE_OTHER): Payer: Self-pay | Admitting: Psychiatry

## 2018-12-23 DIAGNOSIS — Z5329 Procedure and treatment not carried out because of patient's decision for other reasons: Secondary | ICD-10-CM

## 2018-12-23 NOTE — Progress Notes (Signed)
No response to call 

## 2018-12-27 ENCOUNTER — Encounter: Payer: Self-pay | Admitting: Psychiatry

## 2018-12-27 ENCOUNTER — Ambulatory Visit (INDEPENDENT_AMBULATORY_CARE_PROVIDER_SITE_OTHER): Payer: Medicare Other | Admitting: Psychiatry

## 2018-12-27 ENCOUNTER — Other Ambulatory Visit: Payer: Self-pay

## 2018-12-27 ENCOUNTER — Ambulatory Visit: Payer: Medicare Other | Admitting: Licensed Clinical Social Worker

## 2018-12-27 DIAGNOSIS — I2510 Atherosclerotic heart disease of native coronary artery without angina pectoris: Secondary | ICD-10-CM

## 2018-12-27 DIAGNOSIS — F411 Generalized anxiety disorder: Secondary | ICD-10-CM

## 2018-12-27 DIAGNOSIS — F4310 Post-traumatic stress disorder, unspecified: Secondary | ICD-10-CM

## 2018-12-27 DIAGNOSIS — F5105 Insomnia due to other mental disorder: Secondary | ICD-10-CM | POA: Diagnosis not present

## 2018-12-27 DIAGNOSIS — F1590 Other stimulant use, unspecified, uncomplicated: Secondary | ICD-10-CM

## 2018-12-27 DIAGNOSIS — Z9189 Other specified personal risk factors, not elsewhere classified: Secondary | ICD-10-CM

## 2018-12-27 DIAGNOSIS — F331 Major depressive disorder, recurrent, moderate: Secondary | ICD-10-CM

## 2018-12-27 MED ORDER — ARIPIPRAZOLE 2 MG PO TABS: 2.0000 mg | ORAL_TABLET | Freq: Every day | ORAL | 0 refills | Status: DC

## 2018-12-27 MED ORDER — DULOXETINE HCL 30 MG PO CPEP: 90.0000 mg | ORAL_CAPSULE | Freq: Every day | ORAL | 0 refills | Status: DC

## 2018-12-27 MED ORDER — BELSOMRA 10 MG PO TABS: 10.0000 mg | ORAL_TABLET | Freq: Every day | ORAL | 1 refills | Status: DC

## 2018-12-27 NOTE — Progress Notes (Signed)
Virtual Visit via Telephone Note  I connected with Cheyenne Gray on 12/27/18 at  4:15 PM EDT by telephone and verified that I am speaking with the correct person using two identifiers.   I discussed the limitations, risks, security and privacy concerns of performing an evaluation and management service by telephone and the availability of in person appointments. I also discussed with the patient that there may be a patient responsible charge related to this service. The patient expressed understanding and agreed to proceed.  I discussed the assessment and treatment plan with the patient. The patient was provided an opportunity to ask questions and all were answered. The patient agreed with the plan and demonstrated an understanding of the instructions.   The patient was advised to call back or seek an in-person evaluation if the symptoms worsen or if the condition fails to improve as anticipated.   McDonald MD OP Progress Note  12/27/2018 5:03 PM Cheyenne Gray  MRN:  [id number]  Chief Complaint:  Chief Complaint    Follow-up     HPI: Cheyenne Gray is a 53 year old Caucasian female, widowed, on disability, has a history of PTSD, MDD, GAD, insomnia, caffeine use disorder, history of prolonged QT, chronic back pain on methadone, vitamin B12 deficiency, heart failure, iron deficiency, vitamin D deficiency, history of gastric bypass, non-ST elevation MI, hypertension was evaluated by telemedicine today.  Patient preferred to do a phone call.  Patient today reports she continues to have a lot of psychosocial stressors.  She reports her daughter is moving into a new apartment in [place].  She reports that is kind of stressful.  She reports she has to watch her daughter's dog which needs medications at a certain time every day.  She reports she has cannot go out to spend time with her friends since she has to be back at a certain time.  She reports this has been stressful for her.     Patient reports she is  currently only taking Cymbalta 60 mg.  She is not on the 90 mg at this time.  She reports there was a problem at the pharmacy and they would not fill it for her.  Patient continues to take Abilify 1 mg daily which has been helpful to some extent.  She denies any side effects.  She reports sleep continues to be restless.  She takes Belsomra 5 mg which helps her to sleep for at least 3 hours.  She has been working on sleep hygiene and using meditation music to calm her down.  Patient denies any suicidality, homicidality or perceptual disturbances.  Patient denies any other concerns today.    Visit Diagnosis:    ICD-10-CM   1. PTSD (post-traumatic stress disorder)  F43.10 DULoxetine (CYMBALTA) 30 MG capsule    ARIPiprazole (ABILIFY) 2 MG tablet    Suvorexant (BELSOMRA) 10 MG TABS  2. GAD (generalized anxiety disorder)  F41.1 DULoxetine (CYMBALTA) 30 MG capsule    Suvorexant (BELSOMRA) 10 MG TABS  3. MDD (major depressive disorder), recurrent episode, moderate (HCC)  F33.1 DULoxetine (CYMBALTA) 30 MG capsule    ARIPiprazole (ABILIFY) 2 MG tablet    Suvorexant (BELSOMRA) 10 MG TABS  4. Insomnia due to mental condition  F51.05 Suvorexant (BELSOMRA) 10 MG TABS  5. 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 At risk for long QT syndrome  Z91.89     Past Psychiatric History: I have reviewed past psychiatric history from my progress note on 11/06/2017.  Past trials of  Paxil, Cymbalta, Valium, melatonin, trazodone, Ambien, Wellbutrin  Past Medical History:  Past Medical History:  Diagnosis Date  . (HFpEF) heart failure with preserved ejection fraction ([place])    a. Echo 2014: EF 65-70%, nl WM, mildly dilated LA, PASP nl; b. 12/2014 Echo: EF 65-70%, no rwma, mod septal hypertrophy w/o LVOT gradient or SAM; c. 07/2017 Echo: EF 55-60%, no rwma, mildly dil RV w/ nl syst fxn. Mildly dil RA. Dilated IVC w/ elevated CVP. Triv post effusion.  Marland Kitchen Anxiety   . Arthritis   . Asthma   . B12 deficiency   . Chronic back  pain   . Chronic headaches   . Chronic pain    a. on methadone  . Concussion    hx of 4  . Coronary artery disease, non-occlusive    a. LHC 1/18: proximal to mid LAD 40% stenosed, mid LAD 30% stenosed, mid RCA 20% stenosed, distal RCA 20% stenosed, EF 55-65%, LVEDP normal  . Depression   . DJD (degenerative joint disease), multiple sites   . Frequent headaches   . Gallstone   . H/O non-ST elevation myocardial infarction (NSTEMI)   . Hand, foot and mouth disease 2016  . History of shingles   . Hypertension   . Iron deficiency anemia   . Long QT interval   . Methadone use ([place])    managed by Dr. Primus Bravo  . Migraine    1x/mo  . Motion sickness    cars  . Obesity   . Palpitations    a. 24 hour Holter: NSR, sinus brady down to 48, occasional PVCs & couplets, 8 beats NSVT; b. 30 day event monitor 2015: NSR with rare PVC.  Marland Kitchen Psoriasis   . Syncope and collapse   . Vitamin D deficiency   . Wears dentures    full upper and lower    Past Surgical History:  Procedure Laterality Date  . ABDOMINOPLASTY     tummy tuck ? year   . [place] SURGERY  2001  . CARDIAC CATHETERIZATION Left 04/16/2016   Procedure: Left Heart Cath and Coronary Angiography;  Surgeon: [name], MD;  Location: [facility];  Service: Cardiovascular;  Laterality: Left;  . CHOLECYSTECTOMY  2001  . COLONOSCOPY WITH PROPOFOL N/A 09/28/2018   Procedure: COLONOSCOPY WITH PROPOFOL;  Surgeon: Virgel Manifold, MD;  Location: [facility];  Service: Endoscopy;  Laterality: N/A;  . ESOPHAGOGASTRODUODENOSCOPY (EGD) WITH PROPOFOL N/A 09/28/2018   Procedure: ESOPHAGOGASTRODUODENOSCOPY (EGD) WITH BIOPSY;  Surgeon: Virgel Manifold, MD;  Location: [facility];  Service: Endoscopy;  Laterality: N/A;  . GALLBLADDER SURGERY    . GASTRIC BYPASS  2001  . GASTROPLASTY      Family Psychiatric History: I have reviewed family psychiatric history from my progress note on 11/06/2017  Family History:   Family History  Problem Relation Age of Onset  . Heart attack Mother   . Cancer Mother        pancreatitic   . Early death Mother   . Heart attack Father 88       MI  . Early death Father   . Heart disease Father   . Heart attack Brother   . Heart disease Brother   . Arthritis Brother   . Depression Brother   . Diabetes Brother   . Heart attack Maternal Grandmother   . Cancer Maternal Grandmother        pancreatitic   . Heart disease Maternal Grandmother   .  Cancer Maternal Uncle        pancreatitic   . Cancer Paternal Grandmother        ? type   . Diabetes Paternal Grandmother   . Cancer Maternal Uncle        pancreatitic   . Breast cancer Maternal Aunt     Social History: Reviewed social history from my progress note on 11/06/2017 Social History   Socioeconomic History  . Marital status: Widowed    Spouse name: Not on file  . Number of children: 1  . Years of education: assoc degree  . Highest education level: Associate degree: occupational, Hotel manager, or vocational program  Occupational History  . Not on file  Social Needs  . Financial resource strain: Very hard  . Food insecurity    Worry: Never true    Inability: Never true  . Transportation needs    Medical: No    Non-medical: No  Tobacco Use  . Smoking status: Never Smoker  . Smokeless tobacco: Never Used  Substance and Sexual Activity  . Alcohol use: No    Alcohol/week: 0.0 standard drinks    Comment: holidays  . Drug use: No  . Sexual activity: Not Currently  Lifestyle  . Physical activity    Days per week: 0 days    Minutes per session: 0 min  . Stress: Very much  Relationships  . Social Herbalist on phone: Once a week    Gets together: Never    Attends religious service: Never    Active member of club or organization: No    Attends meetings of clubs or organizations: Never    Relationship status: Widowed  Other Topics Concern  . Not on file  Social History Narrative    Adopted daughter [name] [phone] (now lives in [place] going to [place] state has apt there)   Significant other mac [phone], former husband died    Teacher ages 8 and up    Never smoker    No guns   Wears seat belt    No caffeine    Allergies:  Allergies  Allergen Reactions  . Penicillins Hives, Shortness Of Breath and Rash    Has patient had a PCN reaction causing immediate rash, facial/tongue/throat swelling, SOB or lightheadedness with hypotension: Yes Has patient had a PCN reaction causing severe rash involving mucus membranes or skin necrosis: No Has patient had a PCN reaction that required hospitalization No Has patient had a PCN reaction occurring within the last 10 years: No If all of the above answers are "NO", then may proceed with Cephalosporin use.   . Bee Venom     Metabolic Disorder Labs: Lab Results  Component Value Date   HGBA1C 5.3 09/01/2018   No results found for: PROLACTIN Lab Results  Component Value Date   CHOL 176 09/01/2018   TRIG 113.0 09/01/2018   HDL 57.90 09/01/2018   CHOLHDL 3 09/01/2018   VLDL 22.6 09/01/2018   LDLCALC 95 09/01/2018   LDLCALC 47 10/02/2016   Lab Results  Component Value Date   TSH 2.63 09/01/2018   TSH 3.037 07/26/2017    Therapeutic Level Labs: No results found for: LITHIUM No results found for: VALPROATE No components found for:  CBMZ  Current Medications: Current Outpatient Medications  Medication Sig Dispense Refill  . albuterol (PROVENTIL HFA;VENTOLIN HFA) 108 (90 BASE) MCG/ACT inhaler Inhale 2 puffs into the lungs as needed for wheezing.    . ARIPiprazole (ABILIFY)  2 MG tablet Take 1 tablet (2 mg total) by mouth daily. 90 tablet 0  . bisacodyl (BISACODYL) 5 MG EC tablet Please take 2 tablets (99m) the day before procedure between 1pm and 3pm 30 tablet 0  . BLACK COHOSH PO Take by mouth daily.    . busPIRone (BUSPAR) 15 MG tablet Take 1 tablet (15 mg total) by mouth 2 (two) times daily. 180 tablet 0  .  Cholecalciferol (VITAMIN D3) 125 MCG (5000 UT) TABS Take by mouth daily.    . diclofenac sodium (VOLTAREN) 1 % GEL Apply 4 g topically 4 (four) times daily. Prn knees and 2 grams qid prn hands arthritis 100 g 11  . DULoxetine (CYMBALTA) 30 MG capsule Take 3 capsules (90 mg total) by mouth daily. 270 capsule 0  . EPINEPHrine 0.3 mg/0.3 mL IJ SOAJ injection Inject 0.3 mLs (0.3 mg total) into the muscle as needed for anaphylaxis. 2 each 0  . furosemide (LASIX) 40 MG tablet     . gabapentin (NEURONTIN) 400 MG capsule Limit 2 tablets in the a.m. and midday and 3 tablets each evening    Please dispense a three-month supply 630 capsule 0  . isosorbide mononitrate (IMDUR) 60 MG 24 hr tablet Take 1 tablet (60 mg total) by mouth daily. 90 tablet 3  . methadone (DOLOPHINE) 10 MG tablet Limit 1-2 tablets by mouth 2-3 times per day if tolerated 180 tablet 0  . nitroGLYCERIN (NITROSTAT) 0.4 MG SL tablet Place 1 tablet (0.4 mg total) under the tongue every 5 (five) minutes as needed for chest pain. 25 tablet 3  . nystatin cream (MYCOSTATIN) APPLY TO CONNER OF MOUTH 2 4 TIMES DAILY    . Psyllium (METAMUCIL) 0.36 g CAPS Take 1 application by mouth daily. 90 capsule 0  . rOPINIRole (REQUIP) 0.25 MG tablet Take 2-3 tablets (0.5-0.75 mg total) by mouth at bedtime. 90 tablet 11  . rosuvastatin (CRESTOR) 10 MG tablet TAKE 1 TABLET BY MOUTH EVERY DAY 90 tablet 1  . SUMAtriptan (IMITREX) 100 MG tablet TAKE 1 TABLET BY MOUTH AS NEEDED FOR HEADACHE. MAY REPEAT IN 2 HRS IF NEEDED. MAX 200MG/DAY  5  . Suvorexant (BELSOMRA) 10 MG TABS Take 10 mg by mouth at bedtime. 30 tablet 1  . tiZANidine (ZANAFLEX) 2 MG tablet      No current facility-administered medications for this visit.      Musculoskeletal: Strength & Muscle Tone: UTA Gait & Station: Reports as WNL Patient leans: N/A  Psychiatric Specialty Exam: Review of Systems  Psychiatric/Behavioral: Positive for depression. The patient is nervous/anxious and has  insomnia.   All other systems reviewed and are negative.   Last menstrual period 02/18/2016.There is no height or weight on file to calculate BMI.  General Appearance: UTA  Eye Contact:  UTA  Speech:  Clear and Coherent  Volume:  Normal  Mood:  Depressed  Affect:  UTA  Thought Process:  Goal Directed and Descriptions of Associations: Intact  Orientation:  Full (Time, Place, and Person)  Thought Content: Logical   Suicidal Thoughts:  No  Homicidal Thoughts:  No  Memory:  Immediate;   Fair Recent;   Fair Remote;   Fair  Judgement:  Fair  Insight:  Fair  Psychomotor Activity:  UTA  Concentration:  Concentration: Fair and Attention Span: Fair  Recall:  FAES Corporationof Knowledge: Fair  Language: Fair  Akathisia:  No  Handed:  Right  AIMS (if indicated): denies tremors, rigidity  Assets:  Communication  Skills Desire for Improvement Social Support  ADL's:  Intact  Cognition: WNL  Sleep:  Restless   Screenings: GAD-7     Office Visit from 09/21/2015 in [facility]  Total GAD-7 Score  4    Mini-Mental     Office Visit from 10/30/2017 in [facility]  Total Score (max 30 points )  27    PHQ2-9     Office Visit from 12/21/2018 in [facility] Office Visit from 08/18/2017 in [facility] Office Visit from 11/15/2015 in [facility] Office Visit from 10/18/2015 in [facility] Office Visit from 09/21/2015 in [facility]  PHQ-2 Total Score  0  0  0  0  0       Assessment and Plan: Jacquelene is a 53 year old Caucasian female, widowed, has a history of PTSD, depression, anxiety, multiple medical problems including coronary artery disease, history of STEMI, prolonged QT per history, gastric bypass, vitamin D and B12 deficiency, chronic pain on methadone was evaluated by  telemedicine today.  Patient is biologically predisposed given her multiple medical problems, trauma, family history of mental health problems.  Patient does have psychosocial stressors of recent death of her father as well as her daughter relocating.  Patient continues to struggle with depression and sleep problems.  She will benefit from more intensive psychotherapy sessions as well as medication management.  Plan as noted below.  Plan PTSD-stable Cymbalta as prescribed BuSpar 15 mg p.o. 3 times daily Continue psychotherapy with therapist  MDD-unstable Cymbalta 90 mg p.o. daily.  I have sent another prescription for Cymbalta 90 mg to her pharmacy today. Increase Abilify to 2 mg p.o. daily. Continue CBT  GAD-unstable BuSpar and Cymbalta as prescribed Discussed with patient to start seeing her therapist more frequently.  I have also communicated with staff to work her in for a sooner appointment if possible.  Insomnia-unstable Increase Belsomra to 10 mg p.o. nightly I have reviewed [place] controlled substance database. Patient with history of noncompliance to sleep medications since she is worried about taking sleep medications at night since she lives alone.  Caffeine use disorder-improving Patient is cutting back.  At risk for QT syndrome-pending EKG which was ordered last visit.  Follow-up in clinic in 2 to weeks or sooner if needed.  October 26 at 11 AM  I have spent atleast 15 minutes non- face to face with patient today. More than 50 % of the time was spent for psychoeducation and supportive psychotherapy and care coordination. This note was generated in part or whole with voice recognition software. Voice recognition is usually quite accurate but there are transcription errors that can and very often do occur. I apologize for any typographical errors that were not detected and corrected.       Ursula Alert, MD 12/27/2018, 5:03 PM

## 2018-12-29 ENCOUNTER — Ambulatory Visit (INDEPENDENT_AMBULATORY_CARE_PROVIDER_SITE_OTHER): Payer: Medicare Other | Admitting: Licensed Clinical Social Worker

## 2018-12-29 ENCOUNTER — Encounter: Payer: Self-pay | Admitting: Licensed Clinical Social Worker

## 2018-12-29 ENCOUNTER — Other Ambulatory Visit: Payer: Self-pay

## 2018-12-29 DIAGNOSIS — F4310 Post-traumatic stress disorder, unspecified: Secondary | ICD-10-CM | POA: Diagnosis not present

## 2018-12-29 NOTE — Progress Notes (Signed)
Virtual Visit via Video Note  I connected with Cheyenne Gray on 12/29/18 at 10:00 AM EDT by a video enabled telemedicine application and verified that I am speaking with the correct person using two identifiers.   I discussed the limitations of evaluation and management by telemedicine and the availability of in person appointments. The patient expressed understanding and agreed to proceed.  I discussed the assessment and treatment plan with the patient. The patient was provided an opportunity to ask questions and all were answered. The patient agreed with the plan and demonstrated an understanding of the instructions.   The patient was advised to call back or seek an in-person evaluation if the symptoms worsen or if the condition fails to improve as anticipated.  I provided 60 minutes of non-face-to-face time during this encounter.   Alden Hipp, LCSW    THERAPIST PROGRESS NOTE  Session Time: 1000  Participation Level: Active  Behavioral Response: CasualAlertDepressed  Type of Therapy: Individual Therapy  Treatment Goals addressed: Coping  Interventions: CBT  Summary: Cheyenne Gray is a 53 y.o. female who presents with continued symptoms related to her diagnosis. Jetaime reports not doing well since our last session. She reports ongoing relationship problems with her daughter. She reported her daughter seems to be distancing herself from [place] has taken that as a reflection on their relationship. LCSW validated Marjan's feelings and encouraged her to look at it from a more broad lens, and that perhaps her daughter is finally just being a selfish teenager, which is common for 37 year olds. Bernell was able to see this point and recognized that she often takes things very personally when they don't necessarily need to be taken that way. LCSW validated and normalized that idea, and encouraged her to attempt to challenge those thoughts when they come up by utilizing CBT  skills. Tammela expressed understanding and agreement. We discussed several examples of how Eunice could utilize those skills. Njeri reported she will attempt to also start getting out of the house more in an effort to improve her depression symptoms moving forward.   Suicidal/Homicidal: No  Therapist Response: Iyesha continues to work towards her tx goals but has not yet reached them. We will continue to work on emotional regulation skills and improving communication moving forward.   Plan: Return again in 2 weeks.  Diagnosis: Axis I: Post Traumatic Stress Disorder    Axis II: No diagnosis    Alden Hipp, LCSW 12/29/2018

## 2019-01-10 ENCOUNTER — Encounter: Payer: Self-pay | Admitting: Psychiatry

## 2019-01-10 ENCOUNTER — Ambulatory Visit (INDEPENDENT_AMBULATORY_CARE_PROVIDER_SITE_OTHER): Payer: Medicare Other | Admitting: Psychiatry

## 2019-01-10 ENCOUNTER — Other Ambulatory Visit: Payer: Self-pay

## 2019-01-10 DIAGNOSIS — F5105 Insomnia due to other mental disorder: Secondary | ICD-10-CM | POA: Diagnosis not present

## 2019-01-10 DIAGNOSIS — F4310 Post-traumatic stress disorder, unspecified: Secondary | ICD-10-CM

## 2019-01-10 DIAGNOSIS — F411 Generalized anxiety disorder: Secondary | ICD-10-CM | POA: Diagnosis not present

## 2019-01-10 DIAGNOSIS — F331 Major depressive disorder, recurrent, moderate: Secondary | ICD-10-CM

## 2019-01-10 DIAGNOSIS — I2510 Atherosclerotic heart disease of native coronary artery without angina pectoris: Secondary | ICD-10-CM

## 2019-01-10 DIAGNOSIS — F1590 Other stimulant use, unspecified, uncomplicated: Secondary | ICD-10-CM

## 2019-01-10 NOTE — Progress Notes (Signed)
Virtual Visit via Telephone Note  I connected with Cheyenne Gray on 01/10/19 at  9:00 AM EDT by telephone and verified that I am speaking with the correct person using two identifiers.   I discussed the limitations, risks, security and privacy concerns of performing an evaluation and management service by telephone and the availability of in person appointments. I also discussed with the patient that there may be a patient responsible charge related to this service. The patient expressed understanding and agreed to proceed.   I discussed the assessment and treatment plan with the patient. The patient was provided an opportunity to ask questions and all were answered. The patient agreed with the plan and demonstrated an understanding of the instructions.   The patient was advised to call back or seek an in-person evaluation if the symptoms worsen or if the condition fails to improve as anticipated.  [place] MD OP Progress Note  01/10/2019 12:54 PM Cheyenne Gray  MRN:  [id number]  Chief Complaint:  Chief Complaint    Follow-up     HPI: Cheyenne Gray is a 53 year old Caucasian female, widowed, on disability, has a history of a PTSD, MDD, GAD, insomnia, caffeine use disorder, history of prolonged QT, chronic back pain on methadone, vitamin B12 deficiency, heart failure, iron deficiency, vitamin D deficiency, history of gastric bypass, non-ST elevation, MI, hypertension was evaluated by phone today.  Patient preferred to do a phone call.  Patient today reports she continues to struggle with mood symptoms.  She however reports she has been making use of her coping techniques.  She is trying to change her perspective and her attitude towards her stressors.  She reports she is trying to give herself more time in doing things that she would enjoy.  She reports she was not able to pick up the medication from the pharmacy yet.  However she is going to pick them up today.  She reports she wants to try the higher  dosage of Abilify as well as the Belsomra higher dosage for sleep.  She will let writer know once she has tried the medication dosage changes, if she has anymore concerns .  She denies any suicidality.  She reports sleep continues to be restless however she would like to try the [place].  Patient denies any other concerns today. Visit Diagnosis:    ICD-10-CM   1. PTSD (post-traumatic stress disorder)  F43.10    stable  2. GAD (generalized anxiety disorder)  F41.1   3. MDD (major depressive disorder), recurrent episode, moderate (HCC)  F33.1   4. Insomnia due to mental condition  F51.05   5. 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  Past trials of Paxil, Cymbalta, Valium, melatonin, trazodone, Ambien, Wellbutrin.  Past Medical History:  Past Medical History:  Diagnosis Date  . (HFpEF) heart failure with preserved ejection fraction ([place])    a. Echo 2014: EF 65-70%, nl WM, mildly dilated LA, PASP nl; b. 12/2014 Echo: EF 65-70%, no rwma, mod septal hypertrophy w/o LVOT gradient or SAM; c. 07/2017 Echo: EF 55-60%, no rwma, mildly dil RV w/ nl syst fxn. Mildly dil RA. Dilated IVC w/ elevated CVP. Triv post effusion.  Marland Kitchen Anxiety   . Arthritis   . Asthma   . B12 deficiency   . Chronic back pain   . Chronic headaches   . Chronic pain    a. on methadone  .  Concussion    hx of 4  . Coronary artery disease, non-occlusive    a. LHC 1/18: proximal to mid LAD 40% stenosed, mid LAD 30% stenosed, mid RCA 20% stenosed, distal RCA 20% stenosed, EF 55-65%, LVEDP normal  . Depression   . DJD (degenerative joint disease), multiple sites   . Frequent headaches   . Gallstone   . H/O non-ST elevation myocardial infarction (NSTEMI)   . Hand, foot and mouth disease 2016  . History of shingles   . Hypertension   . Iron deficiency anemia   . Long QT interval   . Methadone use ([place])    managed by  Dr. Primus Bravo  . Migraine    1x/mo  . Motion sickness    cars  . Obesity   . Palpitations    a. 24 hour Holter: NSR, sinus brady down to 48, occasional PVCs & couplets, 8 beats NSVT; b. 30 day event monitor 2015: NSR with rare PVC.  Marland Kitchen Psoriasis   . Syncope and collapse   . Vitamin D deficiency   . Wears dentures    full upper and lower    Past Surgical History:  Procedure Laterality Date  . ABDOMINOPLASTY     tummy tuck ? year   . [place] SURGERY  2001  . CARDIAC CATHETERIZATION Left 04/16/2016   Procedure: Left Heart Cath and Coronary Angiography;  Surgeon: [name], MD;  Location: [facility];  Service: Cardiovascular;  Laterality: Left;  . CHOLECYSTECTOMY  2001  . COLONOSCOPY WITH PROPOFOL N/A 09/28/2018   Procedure: COLONOSCOPY WITH PROPOFOL;  Surgeon: Virgel Manifold, MD;  Location: [facility];  Service: Endoscopy;  Laterality: N/A;  . ESOPHAGOGASTRODUODENOSCOPY (EGD) WITH PROPOFOL N/A 09/28/2018   Procedure: ESOPHAGOGASTRODUODENOSCOPY (EGD) WITH BIOPSY;  Surgeon: Virgel Manifold, MD;  Location: [facility];  Service: Endoscopy;  Laterality: N/A;  . GALLBLADDER SURGERY    . GASTRIC BYPASS  2001  . GASTROPLASTY      Family Psychiatric History: I have reviewed family psychiatric history from my progress note on 11/06/2017.  Family History:  Family History  Problem Relation Age of Onset  . Heart attack Mother   . Cancer Mother        pancreatitic   . Early death Mother   . Heart attack Father 22       MI  . Early death Father   . Heart disease Father   . Heart attack Brother   . Heart disease Brother   . Arthritis Brother   . Depression Brother   . Diabetes Brother   . Heart attack Maternal Grandmother   . Cancer Maternal Grandmother        pancreatitic   . Heart disease Maternal Grandmother   . Cancer Maternal Uncle        pancreatitic   . Cancer Paternal Grandmother        ? type   . Diabetes Paternal Grandmother   .  Cancer Maternal Uncle        pancreatitic   . Breast cancer Maternal Aunt     Social History: Reviewed social history from my progress note on 11/06/2017. Social History   Socioeconomic History  . Marital status: Widowed    Spouse name: Not on file  . Number of children: 1  . Years of education: assoc degree  . Highest education level: Associate degree: occupational, Hotel manager, or vocational program  Occupational History  . Not on file  Social Needs  .  Financial resource strain: Very hard  . Food insecurity    Worry: Never true    Inability: Never true  . Transportation needs    Medical: No    Non-medical: No  Tobacco Use  . Smoking status: Never Smoker  . Smokeless tobacco: Never Used  Substance and Sexual Activity  . Alcohol use: No    Alcohol/week: 0.0 standard drinks    Comment: holidays  . Drug use: No  . Sexual activity: Not Currently  Lifestyle  . Physical activity    Days per week: 0 days    Minutes per session: 0 min  . Stress: Very much  Relationships  . Social Herbalist on phone: Once a week    Gets together: Never    Attends religious service: Never    Active member of club or organization: No    Attends meetings of clubs or organizations: Never    Relationship status: Widowed  Other Topics Concern  . 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 Penicillins Hives, Shortness Of Breath and Rash    Has patient had a PCN reaction causing immediate rash, facial/tongue/throat swelling, SOB or lightheadedness with hypotension: Yes Has patient had a PCN reaction causing severe rash involving mucus membranes or skin necrosis: No Has patient had a PCN reaction that  required hospitalization No Has patient had a PCN reaction occurring within the last 10 years: No If all of the above answers are "NO", then may proceed with Cephalosporin use.   . Bee Venom     Metabolic Disorder Labs: Lab Results  Component Value Date   HGBA1C 5.3 09/01/2018   No results found for: PROLACTIN Lab Results  Component Value Date   CHOL 176 09/01/2018   TRIG 113.0 09/01/2018   HDL 57.90 09/01/2018   CHOLHDL 3 09/01/2018   VLDL 22.6 09/01/2018   LDLCALC 95 09/01/2018   LDLCALC 47 10/02/2016   Lab Results  Component Value Date   TSH 2.63 09/01/2018   TSH 3.037 07/26/2017    Therapeutic Level Labs: No results found for: LITHIUM No results found for: VALPROATE No components found for:  CBMZ  Current Medications: Current Outpatient Medications  Medication Sig Dispense Refill  . albuterol (PROVENTIL HFA;VENTOLIN HFA) 108 (90 BASE) MCG/ACT inhaler Inhale 2 puffs into the lungs as needed for wheezing.    . ARIPiprazole (ABILIFY) 2 MG tablet Take 1 tablet (2 mg total) by mouth daily. 90 tablet 0  . bisacodyl (BISACODYL) 5 MG EC tablet Please take 2 tablets (83m) the day before procedure between 1pm and 3pm 30 tablet 0  . BLACK COHOSH PO Take by mouth daily.    . busPIRone (BUSPAR) 15 MG tablet Take 1 tablet (15 mg total) by mouth 2 (two) times daily. 180 tablet 0  . Cholecalciferol (VITAMIN D3) 125 MCG (5000 UT) TABS Take by mouth daily.    . diclofenac sodium (VOLTAREN) 1 % GEL Apply 4 g topically 4 (four) times daily. Prn knees and 2 grams qid prn hands arthritis 100 g 11  . DULoxetine (CYMBALTA) 30 MG capsule Take 3  capsules (90 mg total) by mouth daily. 270 capsule 0  . EPINEPHrine 0.3 mg/0.3 mL IJ SOAJ injection Inject 0.3 mLs (0.3 mg total) into the muscle as needed for anaphylaxis. 2 each 0  . furosemide (LASIX) 40 MG tablet     . gabapentin (NEURONTIN) 400 MG capsule Limit 2 tablets in the a.m. and midday and 3 tablets each evening    Please dispense a  three-month supply 630 capsule 0  . isosorbide mononitrate (IMDUR) 60 MG 24 hr tablet Take 1 tablet (60 mg total) by mouth daily. 90 tablet 3  . methadone (DOLOPHINE) 10 MG tablet Limit 1-2 tablets by mouth 2-3 times per day if tolerated 180 tablet 0  . nitroGLYCERIN (NITROSTAT) 0.4 MG SL tablet Place 1 tablet (0.4 mg total) under the tongue every 5 (five) minutes as needed for chest pain. 25 tablet 3  . nystatin cream (MYCOSTATIN) APPLY TO CONNER OF MOUTH 2 4 TIMES DAILY    . Psyllium (METAMUCIL) 0.36 g CAPS Take 1 application by mouth daily. 90 capsule 0  . rOPINIRole (REQUIP) 0.25 MG tablet Take 2-3 tablets (0.5-0.75 mg total) by mouth at bedtime. 90 tablet 11  . rosuvastatin (CRESTOR) 10 MG tablet TAKE 1 TABLET BY MOUTH EVERY DAY 90 tablet 1  . SUMAtriptan (IMITREX) 100 MG tablet TAKE 1 TABLET BY MOUTH AS NEEDED FOR HEADACHE. MAY REPEAT IN 2 HRS IF NEEDED. MAX 200MG/DAY  5  . Suvorexant (BELSOMRA) 10 MG TABS Take 10 mg by mouth at bedtime. 30 tablet 1  . tiZANidine (ZANAFLEX) 2 MG tablet      No current facility-administered medications for this visit.      Musculoskeletal: Strength & Muscle Tone: UTA Gait & Station: Reports as WNL Patient leans: N/A  Psychiatric Specialty Exam: Review of Systems  Psychiatric/Behavioral: Positive for depression. The patient has insomnia.   All other systems reviewed and are negative.   Last menstrual period 02/18/2016.There is no height or weight on file to calculate BMI.  General Appearance: UTA  Eye Contact:  UTA  Speech:  Clear and Coherent  Volume:  Normal  Mood:  Depressed  Affect:  UTA  Thought Process:  Goal Directed and Descriptions of Associations: Intact  Orientation:  Full (Time, Place, and Person)  Thought Content: Logical   Suicidal Thoughts:  No  Homicidal Thoughts:  No  Memory:  Immediate;   Fair Recent;   Fair Remote;   Fair  Judgement:  Fair  Insight:  Fair  Psychomotor Activity:  UTA  Concentration:  Concentration:  Fair and Attention Span: Fair  Recall:  [employer] of Knowledge: Fair  Language: Fair  Akathisia:  No  Handed:  Right  AIMS (if indicated): denies tremors, rigidity  Assets:  Communication Skills Desire for Improvement Housing Social Support  ADL's:  Intact  Cognition: WNL  Sleep:  Restless   Screenings: GAD-7     Office Visit from 09/21/2015 in [facility]  Total GAD-7 Score  4    Mini-Mental     Office Visit from 10/30/2017 in [facility]  Total Score (max 30 points )  27    PHQ2-9     Office Visit from 12/21/2018 in [facility] Office Visit from 08/18/2017 in [facility] Office Visit from 11/15/2015 in [facility] Office Visit from 10/18/2015 in [facility] Office Visit from 09/21/2015 in [facility]  PHQ-2 Total Score  0  0  0  0  0       Assessment and Plan: Cheyenne Gray is a 53 year old Caucasian female, widowed, has a history of PTSD, depression, anxiety, multiple medical problems including coronary artery disease, history of STEMI, prolonged QT per history, gastric bypass, vitamin D and B12 deficiency, chronic pain on methadone was evaluated by phone today.  Patient is biologically predisposed given her multiple health problems, trauma, family history of mental health problems.  Patient also has psychosocial stressors of recent death of her father as well as her daughter relocating.  Patient was provided medications last visit however she has not started the recent readjusted dosage yet.  She plans to start it soon.  Discussed plan as noted below.  Plan PTSD-stable Cymbalta as prescribed BuSpar 15 mg p.o. 3 times daily Continue psychotherapy with therapist.  MDD-unstable Cymbalta 90 mg p.o. daily. Increase Abilify to 2 mg p.o. daily.  Patient will start  the higher dosage today. Continue CBT  GAD-unstable BuSpar and Cymbalta as prescribed Patient advised to continue to work with her therapist.  Patient has been noncompliant with therapy visits.  Insomnia-unstable Belsomra increased to 10 mg p.o. nightly I have reviewed [place] controlled substance database. Patient with history of noncompliance to sleep medications and she is worried about taking sleep medications at night since she lives alone.  Caffeine use disorder-improving She is cutting back  At risk for QT syndrome-pending EKG-she will get it from her primary care provider.  Follow-up in clinic in 3 weeks or sooner if needed.  November 19th at 3 PM  I have spent atleast 15 minutes non face to face with patient today. More than 50 % of the time was spent for psychoeducation and supportive psychotherapy and care coordination. This note was generated in part or whole with voice recognition software. Voice recognition is usually quite accurate but there are transcription errors that can and very often do occur. I apologize for any typographical errors that were not detected and corrected.       Ursula Alert, MD 01/10/2019, 12:54 PM

## 2019-01-16 ENCOUNTER — Other Ambulatory Visit: Payer: Self-pay | Admitting: Emergency Medicine

## 2019-01-16 ENCOUNTER — Emergency Department: Payer: Medicare Other

## 2019-01-16 ENCOUNTER — Emergency Department
Admission: EM | Admit: 2019-01-16 | Discharge: 2019-01-17 | Disposition: A | Discharge status: Home / Self Care | Payer: Medicare Other | Attending: Emergency Medicine | Admitting: Emergency Medicine

## 2019-01-16 ENCOUNTER — Other Ambulatory Visit: Payer: Self-pay

## 2019-01-16 DIAGNOSIS — Z5321 Procedure and treatment not carried out due to patient leaving prior to being seen by health care provider: Secondary | ICD-10-CM | POA: Insufficient documentation

## 2019-01-16 DIAGNOSIS — N644 Mastodynia: Secondary | ICD-10-CM | POA: Insufficient documentation

## 2019-01-16 LAB — COMPREHENSIVE METABOLIC PANEL
ALT: 15 U/L (ref 0–44)
AST: 29 U/L (ref 15–41)
Albumin: 4.1 g/dL (ref 3.5–5.0)
Alkaline Phosphatase: 99 U/L (ref 38–126)
Anion gap: 10 (ref 5–15)
BUN: 9 mg/dL (ref 6–20)
CO2: 30 mmol/L (ref 22–32)
Calcium: 9.4 mg/dL (ref 8.9–10.3)
Chloride: 101 mmol/L (ref 98–111)
Creatinine, Ser: 0.92 mg/dL (ref 0.44–1.00)
GFR calc Af Amer: >60 mL/min (ref >60)
GFR calc non Af Amer: >60 mL/min (ref >60)
Glucose, Bld: 93 mg/dL (ref 70–99)
Potassium: 3.7 mmol/L (ref 3.5–5.1)
Sodium: 141 mmol/L (ref 135–145)
Total Bilirubin: 0.4 mg/dL (ref 0.3–1.2)
Total Protein: 7.9 g/dL (ref 6.5–8.1)

## 2019-01-16 LAB — TROPONIN I (HIGH SENSITIVITY): Troponin I (High Sensitivity): 9 ng/L (ref <18)

## 2019-01-16 LAB — CBC
HCT: 43.1 % (ref 36.0–46.0)
Hemoglobin: 14.2 g/dL (ref 12.0–15.0)
MCH: 30.9 pg (ref 26.0–34.0)
MCHC: 32.9 g/dL (ref 30.0–36.0)
MCV: 93.7 fL (ref 80.0–100.0)
Platelets: 248 10*3/uL (ref 150–400)
RBC: 4.6 MIL/uL (ref 3.87–5.11)
RDW: 12 % (ref 11.5–15.5)
WBC: 10.3 10*3/uL (ref 4.0–10.5)
nRBC: 0 % (ref 0.0–0.2)

## 2019-01-19 ENCOUNTER — Encounter: Payer: Self-pay | Admitting: Internal Medicine

## 2019-01-21 ENCOUNTER — Ambulatory Visit: Payer: Medicare Other | Admitting: Nurse Practitioner

## 2019-01-21 NOTE — Progress Notes (Deleted)
Office Visit    Patient Name: Cheyenne Gray Date of Encounter: 01/21/2019  Primary Care Provider:  McLean-Scocuzza, Pasty Spillersracy N, MD Primary Cardiologist:  Julien Nordmannimothy Gollan, MD  Chief Complaint    53 year old female with a history of chest pain and nonobstructive CAD, hypertension, palpitations, obesity, long QT syndrome, and family history of premature CAD, who presents for follow-up related to chest pain.  Past Medical History    Past Medical History:  Diagnosis Date  . (HFpEF) heart failure with preserved ejection fraction (HCC)    a. Echo 2014: EF 65-70%, nl WM, mildly dilated LA, PASP nl; b. 12/2014 Echo: EF 65-70%, no rwma, mod septal hypertrophy w/o LVOT gradient or SAM; c. 07/2017 Echo: EF 55-60%, no rwma, mildly dil RV w/ nl syst fxn. Mildly dil RA. Dilated IVC w/ elevated CVP. Triv post effusion.  Marland Kitchen. Anxiety   . Arthritis   . Asthma   . B12 deficiency   . Chronic back pain   . Chronic headaches   . Chronic pain    a. on methadone  . Concussion    hx of 4  . Coronary artery disease, non-occlusive    a. LHC 1/18: proximal to mid LAD 40% stenosed, mid LAD 30% stenosed, mid RCA 20% stenosed, distal RCA 20% stenosed, EF 55-65%, LVEDP normal  . Depression   . DJD (degenerative joint disease), multiple sites   . Frequent headaches   . Gallstone   . H/O non-ST elevation myocardial infarction (NSTEMI)   . Hand, foot and mouth disease 2016  . History of shingles   . Hypertension   . Iron deficiency anemia   . Long QT interval   . Methadone use (HCC)    managed by Dr. Metta Clinesrisp  . Migraine    1x/mo  . Motion sickness    cars  . Obesity   . Palpitations    a. 24 hour Holter: NSR, sinus brady down to 48, occasional PVCs & couplets, 8 beats NSVT; b. 30 day event monitor 2015: NSR with rare PVC.  Marland Kitchen. Psoriasis   . Syncope and collapse   . Vitamin D deficiency   . Wears dentures    full upper and lower   Past Surgical History:  Procedure Laterality Date  . ABDOMINOPLASTY      tummy tuck ? year   . BARIATRIC SURGERY  2001  . CARDIAC CATHETERIZATION Left 04/16/2016   Procedure: Left Heart Cath and Coronary Angiography;  Surgeon: Antonieta Ibaimothy J Gollan, MD;  Location: [facility];  Service: Cardiovascular;  Laterality: Left;  . CHOLECYSTECTOMY  2001  . COLONOSCOPY WITH PROPOFOL N/A 09/28/2018   Procedure: COLONOSCOPY WITH PROPOFOL;  Surgeon: Pasty Spillersahiliani, Varnita B, MD;  [facility];  Service: Endoscopy;  Laterality: N/A;  . ESOPHAGOGASTRODUODENOSCOPY (EGD) WITH PROPOFOL N/A 09/28/2018   Procedure: ESOPHAGOGASTRODUODENOSCOPY (EGD) WITH BIOPSY;  Surgeon: Pasty Spillersahiliani, Varnita B, MD;  [facility];  Service: Endoscopy;  Laterality: N/A;  . GALLBLADDER SURGERY    . GASTRIC BYPASS  2001  . GASTROPLASTY      Allergies  Allergies  Allergen Reactions  . Penicillins Hives, Shortness Of Breath and Rash    Has patient had a PCN reaction causing immediate rash, facial/tongue/throat swelling, SOB or lightheadedness with hypotension: Yes Has patient had a PCN reaction causing severe rash involving mucus membranes or skin necrosis: No Has patient had a PCN reaction that required hospitalization No Has patient had a PCN reaction occurring within the last 10 years:  No If all of the above answers are "NO", then may proceed with Cephalosporin use.   Alphonsus Sias Venom     History of Present Illness    53 year old female with the above complex past medical history including family history of premature CAD, long QT syndrome, chest pain with nonobstructive CAD by catheterization in early 2018, hypertension, obesity status post bariatric surgery, and palpitations with prior findings of PVCs on Holter monitoring but without any significant findings on 30-day event monitoring.  Prior echocardiogram in October 2016 showed normal LV function.  Follow-up echo in May 2019, which was performed in the setting of lower extremity swelling and weight gain, showed  normal LV function.  In December 2019, she reported intermittent chest pain and palpitations.  A Lexiscan Myoview as well as a ZIO monitor ordered however, patient ended up not undergoing testing as she felt symptoms were related to stress, which subsequently improved.  She was last seen via telemedicine visit in March of this year.  Prior to that visit she had undergone a CT of the abdomen and pelvis in the setting of abdominal discomfort.  This incidentally showed mild aortic atherosclerosis and mild coronary calcification.  Home Medications    Prior to Admission medications   Medication Sig Start Date End Date Taking? Authorizing Provider  albuterol (PROVENTIL HFA;VENTOLIN HFA) 108 (90 BASE) MCG/ACT inhaler Inhale 2 puffs into the lungs as needed for wheezing.    [provider]  ARIPiprazole (ABILIFY) 2 MG tablet Take 1 tablet (2 mg total) by mouth daily. 12/27/18   Jomarie Longs, MD  bisacodyl (BISACODYL) 5 MG EC tablet Please take 2 tablets (10mg ) the day before procedure between 1pm and 3pm 11/25/18   01/25/19, MD  BLACK COHOSH PO Take by mouth daily.    [provider]  busPIRone (BUSPAR) 15 MG tablet Take 1 tablet (15 mg total) by mouth 2 (two) times daily. 11/24/18   01/24/19, MD  Cholecalciferol (VITAMIN D3) 125 MCG (5000 UT) TABS Take by mouth daily.    [provider]  diclofenac sodium (VOLTAREN) 1 % GEL Apply 4 g topically 4 (four) times daily. Prn knees and 2 grams qid prn hands arthritis 05/05/18   McLean-Scocuzza, 05/07/18, MD  DULoxetine (CYMBALTA) 30 MG capsule Take 3 capsules (90 mg total) by mouth daily. 12/27/18   02/26/19, MD  EPINEPHrine 0.3 mg/0.3 mL IJ SOAJ injection Inject 0.3 mLs (0.3 mg total) into the muscle as needed for anaphylaxis. 12/21/18   McLean-Scocuzza, 02/20/19, MD  furosemide (LASIX) 40 MG tablet  12/30/17   [provider]  gabapentin (NEURONTIN) 400 MG capsule Limit 2 tablets in the a.m. and midday  and 3 tablets each evening    Please dispense a three-month supply 11/15/15   11/17/15, MD  isosorbide mononitrate (IMDUR) 60 MG 24 hr tablet Take 1 tablet (60 mg total) by mouth daily. 03/04/18 09/27/18  09/29/18, NP  methadone (DOLOPHINE) 10 MG tablet Limit 1-2 tablets by mouth 2-3 times per day if tolerated 11/15/15   11/17/15, MD  nitroGLYCERIN (NITROSTAT) 0.4 MG SL tablet Place 1 tablet (0.4 mg total) under the tongue every 5 (five) minutes as needed for chest pain. 03/04/18 03/04/19  03/06/19, NP  nystatin cream (MYCOSTATIN) APPLY TO CONNER OF MOUTH 2 4 TIMES DAILY 11/10/18   [provider]  Psyllium (METAMUCIL) 0.36 g CAPS Take 1 application by mouth daily. 11/25/18 02/23/19  14/9/20, MD  rOPINIRole (REQUIP) 0.25 MG tablet Take 2-3 tablets (0.5-0.75 mg total) by mouth at bedtime. 03/31/18   McLean-Scocuzza, Nino Glow, MD  rosuvastatin (CRESTOR) 10 MG tablet TAKE 1 TABLET BY MOUTH EVERY DAY 12/03/17   [name], MD  SUMAtriptan (IMITREX) 100 MG tablet TAKE 1 TABLET BY MOUTH AS NEEDED FOR HEADACHE. MAY REPEAT IN 2 HRS IF NEEDED. MAX 200MG /DAY 10/09/17   [provider]  Suvorexant (BELSOMRA) 10 MG TABS Take 10 mg by mouth at bedtime. 12/27/18   Ursula Alert, MD  tiZANidine (ZANAFLEX) 2 MG tablet  04/08/18   [provider]    Review of Systems    ***.  All other systems reviewed and are otherwise negative except as noted above.  Physical Exam    VS:  LMP 02/18/2016 (Approximate) Comment: neg HCG-03/26/2016 , BMI There is no height or weight on file to calculate BMI. GEN: Well nourished, well developed, in no acute distress. HEENT: normal. Neck: Supple, no JVD, carotid bruits, or masses. Cardiac: RRR, no murmurs, rubs, or gallops. No clubbing, cyanosis, edema.  Radials/DP/PT 2+ and equal bilaterally.  Respiratory:  Respirations regular and unlabored, clear to auscultation bilaterally. GI: Soft,  nontender, nondistended, BS + x 4. MS: no deformity or atrophy. Skin: warm and dry, no rash. Neuro:  Strength and sensation are intact. Psych: Normal affect.  Accessory Clinical Findings    ECG personally reviewed by me today - *** - no acute changes.  Lab Results  Component Value Date   WBC 10.3 01/16/2019   HGB 14.2 01/16/2019   HCT 43.1 01/16/2019   MCV 93.7 01/16/2019   PLT 248 01/16/2019   Lab Results  Component Value Date   CREATININE 0.92 01/16/2019   BUN 9 01/16/2019   NA 141 01/16/2019   K 3.7 01/16/2019   CL 101 01/16/2019   CO2 30 01/16/2019   Lab Results  Component Value Date   ALT 15 01/16/2019   AST 29 01/16/2019   ALKPHOS 99 01/16/2019   BILITOT 0.4 01/16/2019   Lab Results  Component Value Date   CHOL 176 09/01/2018   HDL 57.90 09/01/2018   LDLCALC 95 09/01/2018   TRIG 113.0 09/01/2018   CHOLHDL 3 09/01/2018     Assessment & Plan    1.  ***   Murray Hodgkins, NP 01/21/2019, 8:33 AM

## 2019-01-24 ENCOUNTER — Telehealth: Payer: Self-pay

## 2019-01-24 ENCOUNTER — Other Ambulatory Visit: Payer: Self-pay | Admitting: Internal Medicine

## 2019-01-24 DIAGNOSIS — M5416 Radiculopathy, lumbar region: Secondary | ICD-10-CM

## 2019-01-24 NOTE — Telephone Encounter (Signed)
-----   Message from Virgel Manifold, MD sent at 01/24/2019 12:04 PM EST ----- Yes, I can talk to her about it and change medication to pharmacologic therapy. Thanks  Caryl Pina please schedule follow up in 1-4 weeks ----- Message ----- From: McLean-Scocuzza, Nino Glow, MD Sent: 12/21/2018   4:05 PM EST To: Virgel Manifold, MD  Can pt try linzess instead of metamucil you get full fast since gastric bypass so pill may work better?

## 2019-01-24 NOTE — Telephone Encounter (Signed)
Made appointment 02/28/2019

## 2019-01-25 ENCOUNTER — Other Ambulatory Visit: Payer: Self-pay

## 2019-01-25 ENCOUNTER — Ambulatory Visit (INDEPENDENT_AMBULATORY_CARE_PROVIDER_SITE_OTHER): Payer: Medicare Other | Admitting: Licensed Clinical Social Worker

## 2019-01-25 ENCOUNTER — Encounter: Payer: Self-pay | Admitting: Licensed Clinical Social Worker

## 2019-01-25 DIAGNOSIS — F411 Generalized anxiety disorder: Secondary | ICD-10-CM

## 2019-01-25 DIAGNOSIS — F4310 Post-traumatic stress disorder, unspecified: Secondary | ICD-10-CM

## 2019-01-25 NOTE — Progress Notes (Signed)
Virtual Visit via Video Note  I connected with Cheyenne Gray on 01/25/19 at 10:00 AM EST by a video enabled telemedicine application and verified that I am speaking with the correct person using two identifiers.   I discussed the limitations of evaluation and management by telemedicine and the availability of in person appointments. The patient expressed understanding and agreed to proceed.  I discussed the assessment and treatment plan with the patient. The patient was provided an opportunity to ask questions and all were answered. The patient agreed with the plan and demonstrated an understanding of the instructions.   The patient was advised to call back or seek an in-person evaluation if the symptoms worsen or if the condition fails to improve as anticipated.  I provided 55 minutes of non-face-to-face time during this encounter.   Alden Hipp, LCSW    THERAPIST PROGRESS NOTE  Session Time: 1000  Participation Level: Active  Behavioral Response: CasualAlertDepressed  Type of Therapy: Individual Therapy  Treatment Goals addressed: Coping  Interventions: Supportive  Summary: Cheyenne Gray is a 53 y.o. female who presents with continued symptoms related to her diagnosis. Cheyenne Gray reports doing "okay," since our last session. She continues to report conflict in her relationship with her daughter. "She just doesn't give a shit anymore. I told her I had to move soon, and she just doesn't give a shit." LCSW validated Cheyenne Gray's feelings, and highlighted that her daughter is likely self-actualizing while in college, and is distancing herself from her "old life." While this is hurtful, it's totally normal in stages of development. Cheyenne Gray was able to understand this, but expressed hurt and disappointment. We discussed that Cheyenne Gray needs to seek happiness outside of her relationship with others--either family relationships or personal relationships in general. We discussed ways Cheyenne Gray could be  come more involved in the community, and spend time with herself but outside of the home. Cheyenne Gray reported she would like to start volunteering at the [employer]. LCSW validated this idea and encouraged Cheyenne Gray to follow through with it. Cheyenne Gray expressed agreement. LCSW also encouraged Cheyenne Gray to take an inventory of what those in her life are bringing to the table, and how she can make sure it is an even balance. Cheyenne Gray expressed understanding and agreement.   Suicidal/Homicidal: No  Therapist Response: Cheyenne Gray continues to work towards her tx goals but has not yet reached them. We will continue to work on emotional regulation skills and improving distress tolerance moving forward.   Plan: Return again in 4 weeks.  Diagnosis: Axis I: Post Traumatic Stress Disorder    Axis II: No diagnosis    Alden Hipp, LCSW 01/25/2019

## 2019-01-27 ENCOUNTER — Other Ambulatory Visit: Admission: RE | Admit: 2019-01-27 | Payer: Medicare Other | Source: Ambulatory Visit

## 2019-01-27 ENCOUNTER — Encounter: Payer: Self-pay | Admitting: Internal Medicine

## 2019-01-27 NOTE — Telephone Encounter (Signed)
Canceled procedure with Cheyenne Gray.

## 2019-01-31 ENCOUNTER — Ambulatory Visit: Admit: 2019-01-31 | Payer: Medicare Other | Admitting: Gastroenterology

## 2019-01-31 SURGERY — COLONOSCOPY WITH PROPOFOL
Anesthesia: Choice

## 2019-02-12 ENCOUNTER — Other Ambulatory Visit: Payer: Self-pay | Admitting: Psychiatry

## 2019-02-12 DIAGNOSIS — F4310 Post-traumatic stress disorder, unspecified: Secondary | ICD-10-CM

## 2019-02-12 DIAGNOSIS — F411 Generalized anxiety disorder: Secondary | ICD-10-CM

## 2019-02-12 DIAGNOSIS — F331 Major depressive disorder, recurrent, moderate: Secondary | ICD-10-CM

## 2019-02-17 ENCOUNTER — Ambulatory Visit: Payer: Medicare Other | Admitting: Licensed Clinical Social Worker

## 2019-02-22 ENCOUNTER — Ambulatory Visit (INDEPENDENT_AMBULATORY_CARE_PROVIDER_SITE_OTHER): Payer: Medicare Other | Admitting: Licensed Clinical Social Worker

## 2019-02-22 ENCOUNTER — Encounter: Payer: Self-pay | Admitting: Licensed Clinical Social Worker

## 2019-02-22 ENCOUNTER — Other Ambulatory Visit: Payer: Self-pay

## 2019-02-22 DIAGNOSIS — F4310 Post-traumatic stress disorder, unspecified: Secondary | ICD-10-CM | POA: Diagnosis not present

## 2019-02-22 NOTE — Progress Notes (Signed)
Virtual Visit via Video Note  I connected with Cheyenne Gray on 02/22/19 at 12:30 PM EST by a video enabled telemedicine application and verified that I am speaking with the correct person using two identifiers.   I discussed the limitations of evaluation and management by telemedicine and the availability of in person appointments. The patient expressed understanding and agreed to proceed.    I discussed the assessment and treatment plan with the patient. The patient was provided an opportunity to ask questions and all were answered. The patient agreed with the plan and demonstrated an understanding of the instructions.   The patient was advised to call back or seek an in-person evaluation if the symptoms worsen or if the condition fails to improve as anticipated.  I provided 45 minutes of non-face-to-face time during this encounter.   Alden Hipp, Cheyenne   THERAPIST PROGRESS NOTE  Session Time: 1230  Participation Level: Active  Behavioral Response: NeatAlertDepressed  Type of Therapy: Individual Therapy  Treatment Goals addressed: Coping  Interventions: Supportive  Summary: Cheyenne Gray is a 53 y.o. female who presents with continued symptoms related to her diagnosis. Cheyenne Gray reports doing "shitty," since our last session. Cheyenne Gray reported ongoing problems in her relationship with her daughter, [name]. Cheyenne Gray reports her daughter came home for Thanksgiving, "and it was really nice. It was just the two of [place] and it was really lovely." Cheyenne Gray reported after Thanksgiving, her daughter went back to school and their problems started again. Cheyenne Gray reported she is likely going to have surgery soon, and will need help caring for herself for a few days following the surgery. Cheyenne Gray stated her daughter's response was, "if I'm not in school I can help, but if I am then I can't help." Cheyenne Gray stated she read this as, "if it's convenient for me, sure. If it's not, screw you." Cheyenne validated Bryton's  feelings associated with this Gray, and held space for her to discuss her point of view further. Cheyenne encouraged Dilpreet to keep an open dialogue with her daughter in an effort to ensure they do not lose touch through this momentary lapse in their otherwise happy relationship. Giabella expressed understanding and agreement, and noted how difficult it is to forgive her daughter for the way she's acting, "she acts like I'm not her mom." Cheyenne validated this idea and encouraged Cheyenne Gray to express this to her daughter. Cheyenne Gray expressed understanding and agreement with this idea as well. We discussed how people's actions are reflections of them, not of [place]. Cheyenne Gray was able to hear this and was in agreement.   Suicidal/Homicidal: No  Therapist Response: Cheyenne Gray continues to work towards her tx goals but has not yet reached them. We will continue to work on improving emotional regulation skills and improving distress tolerance moving forward. We will also continue to work on improving communication in order to decrease conflict in her interpersonal relationships.   Plan: Return again in 4 weeks.  Diagnosis: Axis I: Post Traumatic Stress Disorder    Axis II: No diagnosis    Alden Hipp, Cheyenne 02/22/2019

## 2019-02-24 ENCOUNTER — Ambulatory Visit (INDEPENDENT_AMBULATORY_CARE_PROVIDER_SITE_OTHER): Payer: Medicare Other | Admitting: Gastroenterology

## 2019-02-24 ENCOUNTER — Encounter: Payer: Self-pay | Admitting: Gastroenterology

## 2019-02-24 DIAGNOSIS — R1310 Dysphagia, unspecified: Secondary | ICD-10-CM

## 2019-02-24 DIAGNOSIS — D509 Iron deficiency anemia, unspecified: Secondary | ICD-10-CM | POA: Diagnosis not present

## 2019-02-25 NOTE — Progress Notes (Signed)
Cheyenne BouillonVarnita Maisha Bogen, MD [address]  [address]  [place], [place] [address]  Main: [phone]  Fax: [phone]   Primary Care Physician: McLean-Scocuzza, Pasty Spillersracy N, MD  Virtual Visit via Video Note  I connected with patient on 02/25/19 at  1:30 PM EST by video (using [email or web address]) and verified that I am speaking with the correct person using two identifiers.   I discussed the limitations, risks, security and privacy concerns of performing an evaluation and management service by video and the availability of in person appointments. I also discussed with the patient that there may be a patient responsible charge related to this service. The patient expressed understanding and agreed to proceed.  Location of Patient: Home Location of Provider: Home Persons involved: Patient and provider only (Nursing staff checked in patient via phone but were not physically involved in the video interaction - see their notes)   History of Present Illness: Chief Complaint  Patient presents with  . Dysphagia    Patient is having trouble swallowing     HPI: Cheyenne Gray is a 53 y.o. female here for follow-up of iron deficiency anemia and dysphagia.  History of gastric bypass.  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  However, the referral is in place and [facility] month.  Reports intermittent dysphagia, with no episodes of food impaction.  No weight loss.  We also discussed that she is due for a repeat colonoscopy due to poor prep on last exam.  She would like to wait to schedule this until next year.  We discussed that the colonoscopy itself did not show any large masses or cancers, but small or flat polyps can be missed which can grow into cancer later and therefore she should have a colonoscopy within 1 year of her last exam at the latest.  Previous history:  EGD in July 2020 with  hypertonic lower esophageal sphincter noted.  Anastomosis with healthy-appearing mucosa.  Mildly friable mucosa at anastomosis seen. Normal jejunum.  Pathology did not show EOE  Colonoscopy, July 2020 With poor prep.  Repeat recommended in 3 months.  However, no cancers, masses or any findings to explain the CT thickening noted in the transverse colon on the previous CT.  Therefore CT thickening likely nonspecific  Previous history: Reports an ER visit2018due to food impaction. ER notes reviewed, did reveal the patient was given glucagon, and felt better, and was discharged from the ER. She had an EGD in 2015 due to melena, anemia and abdominal pain. This was done by Dr. 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 Patient was given sucralfate. Stomach biopsies did not reveal H. Pylori.  Reports history of colonoscopy prior to her gastric bypass 15 or 16 years ago as well. No recent colonoscopies. No immediate family history of colon cancer.  Reports alternating constipation and diarrhea. States has some days with hard balls of stool, and others with loose stools.  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 albuterol (PROVENTIL HFA;VENTOLIN HFA) 108 (90 BASE) MCG/ACT inhaler Inhale 2 puffs into the lungs as needed for wheezing.    . ARIPiprazole (ABILIFY) 2  MG tablet Take 1 tablet (2 mg total) by mouth daily. 90 tablet 0  . BLACK COHOSH PO Take by mouth daily.    . busPIRone (BUSPAR) 15 MG tablet Take 1 tablet (15 mg total) by mouth 2 (two) times daily. 180 tablet 0  . Cholecalciferol (VITAMIN D3) 125 MCG (5000 UT) TABS Take by mouth daily.    . diclofenac sodium (VOLTAREN) 1 % GEL Apply 4 g topically 4  (four) times daily. Prn knees and 2 grams qid prn hands arthritis 100 g 11  . DULoxetine (CYMBALTA) 30 MG capsule TAKE 3 CAPSULES BY MOUTH EVERY DAY 225 capsule 2  . EPINEPHrine 0.3 mg/0.3 mL IJ SOAJ injection Inject 0.3 mLs (0.3 mg total) into the muscle as needed for anaphylaxis. 2 each 0  . furosemide (LASIX) 40 MG tablet     . gabapentin (NEURONTIN) 400 MG capsule Limit 2 tablets in the a.m. and midday and 3 tablets each evening    Please dispense a three-month supply 630 capsule 0  . isosorbide mononitrate (IMDUR) 60 MG 24 hr tablet Take 1 tablet (60 mg total) by mouth daily. 90 tablet 3  . methadone (DOLOPHINE) 10 MG tablet Limit 1-2 tablets by mouth 2-3 times per day if tolerated 180 tablet 0  . nitroGLYCERIN (NITROSTAT) 0.4 MG SL tablet Place 1 tablet (0.4 mg total) under the tongue every 5 (five) minutes as needed for chest pain. 25 tablet 3  . nystatin cream (MYCOSTATIN) APPLY TO CONNER OF MOUTH 2 4 TIMES DAILY    . rOPINIRole (REQUIP) 0.25 MG tablet Take 2-3 tablets (0.5-0.75 mg total) by mouth at bedtime. 90 tablet 11  . rosuvastatin (CRESTOR) 10 MG tablet TAKE 1 TABLET BY MOUTH EVERY DAY 90 tablet 1  . SUMAtriptan (IMITREX) 100 MG tablet TAKE 1 TABLET BY MOUTH AS NEEDED FOR HEADACHE. MAY REPEAT IN 2 HRS IF NEEDED. MAX 200MG /DAY  5  . Suvorexant (BELSOMRA) 10 MG TABS Take 10 mg by mouth at bedtime. 30 tablet 1   No current facility-administered medications for this visit.    Allergies as of 02/24/2019 - Review Complete 02/24/2019  Allergen Reaction Noted  . Penicillins Hives, Shortness Of Breath, and Rash 07/20/2009  . Bee venom  12/21/2018    Review of Systems:    All systems reviewed and negative except where noted in HPI.   Observations/Objective:  Labs: CMP     Component Value Date/Time   NA 141 01/16/2019 0142   NA 142 03/28/2016 1519   K 3.7 01/16/2019 0142   CL 101 01/16/2019 0142   CO2 30 01/16/2019 0142   GLUCOSE 93 01/16/2019 0142   BUN 9 01/16/2019 0142    BUN 5 (L) 03/28/2016 1519   CREATININE 0.92 01/16/2019 0142   CREATININE 0.86 08/18/2017 0954   CALCIUM 9.4 01/16/2019 0142   PROT 7.9 01/16/2019 0142   PROT 6.3 05/31/2018 1144   PROT 6.4 05/04/2013 1556   ALBUMIN 4.1 01/16/2019 0142   ALBUMIN 4.0 05/31/2018 1144   ALBUMIN 3.1 (L) 05/04/2013 1556   AST 29 01/16/2019 0142   AST 38 (H) 05/04/2013 1556   ALT 15 01/16/2019 0142   ALT 39 05/04/2013 1556   ALKPHOS 99 01/16/2019 0142   ALKPHOS 84 05/04/2013 1556   BILITOT 0.4 01/16/2019 0142   BILITOT 0.4 05/31/2018 1144   BILITOT 0.3 05/04/2013 1556   GFRNONAA >60 01/16/2019 0142   GFRNONAA 79 08/18/2017 0954   GFRAA >60 01/16/2019 0142   GFRAA 92 08/18/2017 0954  Lab Results  Component Value Date   WBC 10.3 01/16/2019   HGB 14.2 01/16/2019   HCT 43.1 01/16/2019   MCV 93.7 01/16/2019   PLT 248 01/16/2019    Imaging Studies: No results found.  Assessment and Plan:   Cheyenne Gray is a 53 y.o. 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 The patient was provided an opportunity to ask questions and all were answered. The patient agreed with the plan and demonstrated an understanding of the instructions.   The patient was advised to call back or seek an in-person evaluation if the symptoms worsen or if the condition fails to improve as anticipated.  I provided 15 minutes of face-to-face time via video software during this encounter. Additional time was spent in reviewing patient's chart,  placing orders etc.   Pasty Spillers, MD  Speech recognition software was used to dictate this note.

## 2019-02-28 ENCOUNTER — Ambulatory Visit: Payer: Medicare Other | Admitting: Gastroenterology

## 2019-03-03 ENCOUNTER — Ambulatory Visit: Payer: Medicare Other | Attending: Internal Medicine

## 2019-03-03 ENCOUNTER — Other Ambulatory Visit: Payer: Self-pay

## 2019-03-03 DIAGNOSIS — Z20822 Contact with and (suspected) exposure to covid-19: Secondary | ICD-10-CM

## 2019-03-05 LAB — NOVEL CORONAVIRUS, NAA: SARS-CoV-2, NAA: NOT DETECTED

## 2019-03-08 ENCOUNTER — Other Ambulatory Visit: Payer: Self-pay

## 2019-03-08 MED ORDER — ISOSORBIDE MONONITRATE ER 60 MG PO TB24: 60.0000 mg | ORAL_TABLET | Freq: Every day | ORAL | 0 refills | Status: DC

## 2019-03-09 ENCOUNTER — Encounter: Payer: Self-pay | Admitting: Internal Medicine

## 2019-03-10 MED ORDER — GABAPENTIN 400 MG PO CAPS: ORAL_CAPSULE | ORAL | 0 refills | Status: DC

## 2019-03-23 ENCOUNTER — Other Ambulatory Visit: Payer: Self-pay

## 2019-03-23 ENCOUNTER — Ambulatory Visit (INDEPENDENT_AMBULATORY_CARE_PROVIDER_SITE_OTHER): Payer: Medicare Other | Admitting: Licensed Clinical Social Worker

## 2019-03-23 ENCOUNTER — Encounter: Payer: Self-pay | Admitting: Licensed Clinical Social Worker

## 2019-03-23 DIAGNOSIS — F4310 Post-traumatic stress disorder, unspecified: Secondary | ICD-10-CM | POA: Diagnosis not present

## 2019-03-23 NOTE — Progress Notes (Signed)
Virtual Visit via Video Note  I connected with Cheyenne Gray on 03/23/19 at 10:00 AM EST by a video enabled telemedicine application and verified that I am speaking with the correct person using two identifiers.   I discussed the limitations of evaluation and management by telemedicine and the availability of in person appointments. The patient expressed understanding and agreed to proceed.  I discussed the assessment and treatment plan with the patient. The patient was provided an opportunity to ask questions and all were answered. The patient agreed with the plan and demonstrated an understanding of the instructions.   The patient was advised to call back or seek an in-person evaluation if the symptoms worsen or if the condition fails to improve as anticipated.  I provided 45 minutes of non-face-to-face time during this encounter.   Heidi Dach, LCSW   THERAPIST PROGRESS NOTE  Session Time: 1000  Participation Level: Active  Behavioral Response: NeatAlertAnxious  Type of Therapy: Individual Therapy  Treatment Goals addressed: Coping  Interventions: Strength-based and Supportive  Summary: Cheyenne Gray is a 54 y.o. female who presents with continued symptoms related to her diagnosis. Yitzel reports doing better since our last session. She reports feeling much more like herself and was able to have a nice holiday season with her daughter. Jamilyn reports ongoing problems within her relationship with her daughter, but noted she is trying to change the way she approaches these problems. Blanca reported, "I bit my tongue a lot while she was home, she just wasn't acting like my kid at times. I didn't say anything until she kept calling me dummy and stupid, and I told her she was not allowed to call me that." LCSW validated Albina's feelings and highlighted how difficult it must have been to set that boundary appropriately. Mya expressed agreement and noted, "I'm just not going to have  her talking to me like that." LCSW held space for Paw to discuss other problems that came up over her holiday break. Blanchie noted she is trying to find ways to be herself again and to get back to doing things she enjoys, "I really listened to you last time we spoke and I know I need to do more of that." LCSW validated Leoda's feelings and efforts and encouraged her to recognize how much progress she has made in her mental health recently. Hetty was able to see this progress as well.   Suicidal/Homicidal: No  Therapist Response: Latresha continues to work towards her tx goals but has not yet reached them. We will continue to work on improving emotional regulation skills and distress tolerance moving forward.   Plan: Return again in 2 weeks.  Diagnosis: Axis I: Post Traumatic Stress Disorder    Axis II: No diagnosis    Heidi Dach, LCSW 03/23/2019

## 2019-03-30 ENCOUNTER — Encounter: Payer: Self-pay | Admitting: Psychiatry

## 2019-03-30 ENCOUNTER — Ambulatory Visit (INDEPENDENT_AMBULATORY_CARE_PROVIDER_SITE_OTHER): Payer: Medicare Other | Admitting: Psychiatry

## 2019-03-30 ENCOUNTER — Other Ambulatory Visit: Payer: Self-pay

## 2019-03-30 DIAGNOSIS — F411 Generalized anxiety disorder: Secondary | ICD-10-CM | POA: Diagnosis not present

## 2019-03-30 DIAGNOSIS — F5105 Insomnia due to other mental disorder: Secondary | ICD-10-CM | POA: Diagnosis not present

## 2019-03-30 DIAGNOSIS — F4310 Post-traumatic stress disorder, unspecified: Secondary | ICD-10-CM | POA: Diagnosis not present

## 2019-03-30 DIAGNOSIS — F331 Major depressive disorder, recurrent, moderate: Secondary | ICD-10-CM | POA: Diagnosis not present

## 2019-03-30 DIAGNOSIS — F1590 Other stimulant use, unspecified, uncomplicated: Secondary | ICD-10-CM

## 2019-03-30 MED ORDER — ARIPIPRAZOLE 2 MG PO TABS: 2.0000 mg | ORAL_TABLET | Freq: Every day | ORAL | 0 refills | Status: DC

## 2019-03-30 NOTE — Progress Notes (Signed)
Virtual Visit via Telephone Note  I connected with Cheyenne Gray on 03/30/19 at  9:45 AM EST by telephone and verified that I am speaking with the correct person using two identifiers.   I discussed the limitations, risks, security and privacy concerns of performing an evaluation and management service by telephone and the availability of in person appointments. I also discussed with the patient that there may be a patient responsible charge related to this service. The patient expressed understanding and agreed to proceed.      I discussed the assessment and treatment plan with the patient. The patient was provided an opportunity to ask questions and all were answered. The patient agreed with the plan and demonstrated an understanding of the instructions.   The patient was advised to call back or seek an in-person evaluation if the symptoms worsen or if the condition fails to improve as anticipated.   [place] MD OP Progress Note  03/30/2019 3:57 PM Cheyenne Gray  MRN:  [id number]  Chief Complaint:  Chief Complaint    Follow-up     HPI: Cheyenne Gray is a 54 year old Caucasian female, widowed, on disability, has a history of PTSD, MDD, GAD, insomnia, caffeine use disorder, history of prolonged QT, chronic back pain on methadone, vitamin B12 deficiency, heart failure, iron deficiency, vitamin D deficiency, history of gastric bypass, non-ST elevation, MI, hypertension was evaluated by phone today.  Patient preferred to do a phone call.  Patient today reports she and her family where in quarantine since they got exposed to COVID-19 during the holidays.  She reports she tested negative.  She is relieved about that.  She reports she continues to feel depressed more so because of the COVID-19 restrictions and inability to go places.  She reports she however has been making use of her support system, talking to her friends.  She reports her daughter was home for the holidays and just left 2 days ago.   Having her with her also helped a lot.  She reports she tried Belsomra 10 mg for sleep however she slept for 2 days straight after taking it.  She hence has been taking half tablet and that has been beneficial.  She is able to sleep for around 4 hours on the half tablet.  Discussed adding melatonin and she agrees with that.  Patient denies any suicidality, homicidality or perceptual disturbances.  Patient continues to work with her therapist and reports therapy sessions is beneficial. Visit Diagnosis:    ICD-10-CM   1. PTSD (post-traumatic stress disorder)  F43.10 ARIPiprazole (ABILIFY) 2 MG tablet  2. GAD (generalized anxiety disorder)  F41.1   3. MDD (major depressive disorder), recurrent episode, moderate (HCC)  F33.1 ARIPiprazole (ABILIFY) 2 MG tablet  4. Insomnia due to mental condition  F51.05   5. 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  Past trials of Paxil, Cymbalta, Valium, melatonin, trazodone, Ambien, Wellbutrin  Past Medical History:  Past Medical History:  Diagnosis Date  . (HFpEF) heart failure with preserved ejection fraction ([place])    a. Echo 2014: EF 65-70%, nl WM, mildly dilated LA, PASP nl; b. 12/2014 Echo: EF 65-70%, no rwma, mod septal hypertrophy w/o LVOT gradient or SAM; c. 07/2017 Echo: EF 55-60%, no rwma, mildly dil RV w/ nl syst fxn. Mildly dil RA. Dilated IVC w/ elevated CVP. Triv post effusion.  Marland Kitchen Anxiety   . Arthritis   .  Asthma   . B12 deficiency   . Chronic back pain   . Chronic headaches   . Chronic pain    a. on methadone  . Concussion    hx of 4  . Coronary artery disease, non-occlusive    a. LHC 1/18: proximal to mid LAD 40% stenosed, mid LAD 30% stenosed, mid RCA 20% stenosed, distal RCA 20% stenosed, EF 55-65%, LVEDP normal  . Depression   . DJD (degenerative joint disease), multiple sites   . Frequent headaches   . Gallstone   . H/O non-ST  elevation myocardial infarction (NSTEMI)   . Hand, foot and mouth disease 2016  . History of shingles   . Hypertension   . Iron deficiency anemia   . Long QT interval   . Methadone use ([place])    managed by Dr. Primus Bravo  . Migraine    1x/mo  . Motion sickness    cars  . Obesity   . Palpitations    a. 24 hour Holter: NSR, sinus brady down to 48, occasional PVCs & couplets, 8 beats NSVT; b. 30 day event monitor 2015: NSR with rare PVC.  Marland Kitchen Psoriasis   . Syncope and collapse   . Vitamin D deficiency   . Wears dentures    full upper and lower    Past Surgical History:  Procedure Laterality Date  . ABDOMINOPLASTY     tummy tuck ? year   . [place] SURGERY  2001  . CARDIAC CATHETERIZATION Left 04/16/2016   Procedure: Left Heart Cath and Coronary Angiography;  Surgeon: [name], MD;  Location: [facility];  Service: Cardiovascular;  Laterality: Left;  . CHOLECYSTECTOMY  2001  . COLONOSCOPY WITH PROPOFOL N/A 09/28/2018   Procedure: COLONOSCOPY WITH PROPOFOL;  Surgeon: Virgel Manifold, MD;  Location: [facility];  Service: Endoscopy;  Laterality: N/A;  . ESOPHAGOGASTRODUODENOSCOPY (EGD) WITH PROPOFOL N/A 09/28/2018   Procedure: ESOPHAGOGASTRODUODENOSCOPY (EGD) WITH BIOPSY;  Surgeon: Virgel Manifold, MD;  Location: [facility];  Service: Endoscopy;  Laterality: N/A;  . GALLBLADDER SURGERY    . GASTRIC BYPASS  2001  . GASTROPLASTY      Family Psychiatric History: I have reviewed family psychiatric history from my progress note on 11/06/2017.  Family History:  Family History  Problem Relation Age of Onset  . Heart attack Mother   . Cancer Mother        pancreatitic   . Early death Mother   . Heart attack Father 50       MI  . Early death Father   . Heart disease Father   . Heart attack Brother   . Heart disease Brother   . Arthritis Brother   . Depression Brother   . Diabetes Brother   . Heart attack Maternal Grandmother   . Cancer  Maternal Grandmother        pancreatitic   . Heart disease Maternal Grandmother   . Cancer Maternal Uncle        pancreatitic   . Cancer Paternal Grandmother        ? type   . Diabetes Paternal Grandmother   . Cancer Maternal Uncle        pancreatitic   . Breast cancer Maternal Aunt     Social History: Reviewed social history from my progress note on 11/06/2017. Social History   Socioeconomic History  . Marital status: Widowed    Spouse name: Not on file  . Number of children: 1  .  Years of education: assoc degree  . Highest education level: Associate degree: occupational, Hotel manager, or vocational program  Occupational History  . Not on file  Tobacco Use  . Smoking status: Never Smoker  . Smokeless tobacco: Never Used  Substance and Sexual Activity  . Alcohol use: No    Alcohol/week: 0.0 standard drinks    Comment: holidays  . Drug use: No  . Sexual activity: Not Currently  Other Topics Concern  . Not on file  Social History Narrative   Adopted daughter [name] [phone] (now lives in 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 Difficulty of Paying Living Expenses: Not on file  Food Insecurity:   . Worried About Charity fundraiser in the Last Year: Not on file  . Ran Out of Food in the Last Year: Not on file  Transportation Needs:   . Lack of Transportation (Medical): Not on file  . Lack of Transportation (Non-Medical): Not on file  Physical Activity:   . Days of Exercise per Week: Not on file  . Minutes of Exercise per Session: Not on file  Stress:   . Feeling of Stress : Not on file  Social Connections:   . Frequency of Communication with Friends and Family: Not on file  . Frequency of Social Gatherings with Friends and Family: Not on file  . Attends  Religious Services: Not on file  . Active Member of Clubs or Organizations: Not on file  . Attends Archivist Meetings: Not on file  . Marital Status: Not on file    Allergies:  Allergies  Allergen Reactions  . Penicillins Hives, Shortness Of Breath and Rash    Has patient had a PCN reaction causing immediate rash, facial/tongue/throat swelling, SOB or lightheadedness with hypotension: Yes Has patient had a PCN reaction causing severe rash involving mucus membranes or skin necrosis: No Has patient had a PCN reaction that required hospitalization No Has patient had a PCN reaction occurring within the last 10 years: No If all of the above answers are "NO", then may proceed with Cephalosporin use.   . Bee Venom     Metabolic Disorder Labs: Lab Results  Component Value Date   HGBA1C 5.3 09/01/2018   No results found for: PROLACTIN Lab Results  Component Value Date   CHOL 176 09/01/2018   TRIG 113.0 09/01/2018   HDL 57.90 09/01/2018   CHOLHDL 3 09/01/2018   VLDL 22.6 09/01/2018   LDLCALC 95 09/01/2018   LDLCALC 47 10/02/2016   Lab Results  Component Value Date   TSH 2.63 09/01/2018   TSH 3.037 07/26/2017    Therapeutic Level Labs: No results found for: LITHIUM No results found for: VALPROATE No components found for:  CBMZ  Current Medications: Current Outpatient Medications  Medication Sig Dispense Refill  . vitamin B-12 (CYANOCOBALAMIN) 500 MCG tablet Take by mouth.    Marland Kitchen albuterol (PROVENTIL HFA;VENTOLIN HFA) 108 (90 BASE) MCG/ACT inhaler Inhale 2 puffs into the lungs as needed for wheezing.    . ARIPiprazole (ABILIFY) 2 MG tablet Take 1 tablet (2 mg total) by mouth daily. 90 tablet 0  . BLACK COHOSH PO Take by mouth daily.    Marland Kitchen  busPIRone (BUSPAR) 15 MG tablet Take 1 tablet (15 mg total) by mouth 2 (two) times daily. 180 tablet 0  . Cholecalciferol (VITAMIN D3) 125 MCG (5000 UT) TABS Take by mouth daily.    . diclofenac sodium (VOLTAREN) 1 % GEL Apply 4 g  topically 4 (four) times daily. Prn knees and 2 grams qid prn hands arthritis 100 g 11  . diclofenac Sodium (VOLTAREN) 1 % GEL     . DULoxetine (CYMBALTA) 30 MG capsule TAKE 3 CAPSULES BY MOUTH EVERY DAY 225 capsule 2  . EPINEPHrine 0.3 mg/0.3 mL IJ SOAJ injection Inject 0.3 mLs (0.3 mg total) into the muscle as needed for anaphylaxis. 2 each 0  . furosemide (LASIX) 40 MG tablet     . gabapentin (NEURONTIN) 400 MG capsule Limit 2 tablets in the a.m. and midday and 3 tablets each evening    Please dispense a three-month supply 630 capsule 0  . isosorbide mononitrate (IMDUR) 60 MG 24 hr tablet Take 1 tablet (60 mg total) by mouth daily. 90 tablet 0  . methadone (DOLOPHINE) 10 MG tablet Limit 1-2 tablets by mouth 2-3 times per day if tolerated 180 tablet 0  . nitroGLYCERIN (NITROSTAT) 0.4 MG SL tablet Place 1 tablet (0.4 mg total) under the tongue every 5 (five) minutes as needed for chest pain. 25 tablet 3  . nystatin cream (MYCOSTATIN) APPLY TO CONNER OF MOUTH 2 4 TIMES DAILY    . rOPINIRole (REQUIP) 0.25 MG tablet Take 2-3 tablets (0.5-0.75 mg total) by mouth at bedtime. 90 tablet 11  . rosuvastatin (CRESTOR) 10 MG tablet TAKE 1 TABLET BY MOUTH EVERY DAY 90 tablet 1  . SUMAtriptan (IMITREX) 100 MG tablet TAKE 1 TABLET BY MOUTH AS NEEDED FOR HEADACHE. MAY REPEAT IN 2 HRS IF NEEDED. MAX 200MG/DAY  5  . Suvorexant (BELSOMRA) 10 MG TABS Take 10 mg by mouth at bedtime. 30 tablet 1   No current facility-administered medications for this visit.     Musculoskeletal: Strength & Muscle Tone: UTA Gait & Station: Reports as WNL Patient leans: N/A  Psychiatric Specialty Exam: Review of Systems  Psychiatric/Behavioral: Positive for dysphoric mood and sleep disturbance. The patient is nervous/anxious.   All other systems reviewed and are negative.   Last menstrual period 02/18/2016.There is no height or weight on file to calculate BMI.  General Appearance: UTA  Eye Contact:  UTA  Speech:  Clear and  Coherent  Volume:  Normal  Mood:  Anxious and Depressed improving  Affect:  UTA  Thought Process:  Goal Directed and Descriptions of Associations: Intact  Orientation:  Full (Time, Place, and Person)  Thought Content: Logical   Suicidal Thoughts:  No  Homicidal Thoughts:  No  Memory:  Immediate;   Fair Recent;   Fair Remote;   Fair  Judgement:  Fair  Insight:  Fair  Psychomotor Activity:  UTA  Concentration:  Concentration: Fair and Attention Span: Fair  Recall:  [employer] of Knowledge: Fair  Language: Fair  Akathisia:  No  Handed:  Right  AIMS (if indicated):UTA  Assets:  Communication Skills Desire for Improvement Housing Social Support  ADL's:  Intact  Cognition: WNL  Sleep:  Improved   Screenings: GAD-7     Office Visit from 09/21/2015 in [facility]  Total GAD-7 Score  4    Mini-Mental     Office Visit from 10/30/2017 in [facility]  Total Score (max 30 points )  27  PHQ2-9     Office Visit from 12/21/2018 in [facility] Office Visit from 08/18/2017 in [facility] Office Visit from 11/15/2015 in [facility] Office Visit from 10/18/2015 in [facility] Office Visit from 09/21/2015 in [facility]  PHQ-2 Total Score  0  0  0  0  0       Assessment and Plan: Siniyah is a 54 year old Caucasian female, widowed, has a history of PTSD, depression, anxiety, multiple medical problems including coronary artery disease, history of STEMI, prolonged QT per history, gastric bypass, vitamin D and B12 deficiency, chronic pain on methadone was evaluated by phone today.  She is biologically predisposed given her multiple health problems, trauma, family history of mental health problems.  Patient with psychosocial stressors of the current pandemic.  Patient however  is currently making progress.  Plan as noted below.  Plan PTSD-stable Cymbalta as prescribed BuSpar 15 mg p.o. 3 times daily Continue psychotherapy with therapist.  MDD-improving Cymbalta 90 mg p.o. daily Abilify 2 mg p.o. daily. Continue CBT  GAD-improving BuSpar and Cymbalta as prescribed   Insomnia-improving Belsomra-she takes 5 mg at bedtime.  She did not tolerate the 10 mg. Discussed adding melatonin 1 to 2 mg.  Caffeine use disorder-improving She is cutting back.  At risk for QT syndrome-pending EKG.  Follow-up in clinic in 2 months or sooner if needed.  March 1 at 10:20 AM  I have spent atleast 20 minutes non face to face with patient today. More than 50 % of the time was spent for ordering medications and test ,psychoeducation and supportive psychotherapy and care coordination,as well as documenting clinical information in electronic health record. This note was generated in part or whole with voice recognition software. Voice recognition is usually quite accurate but there are transcription errors that can and very often do occur. I apologize for any typographical errors that were not detected and corrected.       Cheyenne Alert, MD 03/30/2019, 3:57 PM

## 2019-04-02 ENCOUNTER — Encounter: Payer: Self-pay | Admitting: Internal Medicine

## 2019-04-04 ENCOUNTER — Other Ambulatory Visit: Payer: Self-pay | Admitting: Internal Medicine

## 2019-04-04 MED ORDER — ROPINIROLE HCL 0.25 MG PO TABS: 0.5000 mg | ORAL_TABLET | Freq: Every day | ORAL | 11 refills | Status: DC

## 2019-04-12 ENCOUNTER — Other Ambulatory Visit: Payer: Self-pay

## 2019-04-12 ENCOUNTER — Encounter: Payer: Self-pay | Admitting: Licensed Clinical Social Worker

## 2019-04-12 ENCOUNTER — Ambulatory Visit (INDEPENDENT_AMBULATORY_CARE_PROVIDER_SITE_OTHER): Payer: Medicare Other | Admitting: Licensed Clinical Social Worker

## 2019-04-12 DIAGNOSIS — F411 Generalized anxiety disorder: Secondary | ICD-10-CM | POA: Diagnosis not present

## 2019-04-12 DIAGNOSIS — F331 Major depressive disorder, recurrent, moderate: Secondary | ICD-10-CM

## 2019-04-12 DIAGNOSIS — F4310 Post-traumatic stress disorder, unspecified: Secondary | ICD-10-CM | POA: Diagnosis not present

## 2019-04-12 NOTE — Progress Notes (Signed)
Virtual Visit via Video Note  I connected with Cheyenne Gray on 04/12/19 at 10:00 AM EST by a video enabled telemedicine application and verified that I am speaking with the correct person using two identifiers.   I discussed the limitations of evaluation and management by telemedicine and the availability of in person appointments. The patient expressed understanding and agreed to proceed.  I discussed the assessment and treatment plan with the patient. The patient was provided an opportunity to ask questions and all were answered. The patient agreed with the plan and demonstrated an understanding of the instructions.   The patient was advised to call back or seek an in-person evaluation if the symptoms worsen or if the condition fails to improve as anticipated.  I provided 45 minutes of non-face-to-face time during this encounter.   Heidi Dach, LCSW    THERAPIST PROGRESS NOTE  Session Time: 1000  Participation Level: Active  Behavioral Response: NeatAlertAnxious  Type of Therapy: Individual Therapy  Treatment Goals addressed: Coping  Interventions: Supportive  Summary: KEARSTYN AVITIA is a 54 y.o. female who presents with continued symptoms related to her diagnosis. Shirleymae reports doing "okay," since our last session, but noted she was nearly scammed out of money again. Jasamine reports feeling, "I have the word idiot stamped on my forehead." LCSW validated Teresina's feelings, but encouraged her to recognize this happens frequently and to a variety of people, so it speaks more to the scammer's qualities rather than Kenadi's. Marwah was able to recognize this and expressed agreement. LCSW held space for Anadia to discuss the situation and where she felt she could have done things differently. We discussed a plan for the future, and how she could determine if the person on the phone was legitimate or was attempting to scam her. Jnya expressed frustration with herself, but noted feeling  better once she spoke to her bank. LCSW encouraged Lanika to challenge thoughts when she feels stupid, as this is something that is not true nor is it helpful for Taffany's mental health. Lissette expressed understanding and agreement with this information as well.  Kelsei reported things have improved with her daughter, but noted they are not as they have been in the past. LCSW validated this idea, and highlighted that relationships--especially mother/daughter relationships, will change and evolve over time--but it is our responsibility to ensure they evolve in a way we are comfortable with, and we are being assertive about our needs throughout. Jonaya expressed understanding and agreement with this information as well.  Suicidal/Homicidal: No  Therapist Response: Dung continues to work towards her tx goals but has not yet reached them. We will continue to work on improving emotional regulation and distress tolerance moving forward.   Plan: Return again in 2 weeks.  Diagnosis: Axis I: Post Traumatic Stress Disorder    Axis II: No diagnosis    Heidi Dach, LCSW 04/12/2019

## 2019-04-18 ENCOUNTER — Encounter: Payer: Self-pay | Admitting: Oncology

## 2019-04-19 NOTE — Telephone Encounter (Signed)
Done... Pts appt has been moved up. Per pt request to have labs and MyChart visit nx week

## 2019-04-25 ENCOUNTER — Inpatient Hospital Stay: Payer: Medicare Other

## 2019-04-26 ENCOUNTER — Inpatient Hospital Stay: Payer: Medicare Other | Attending: Oncology

## 2019-04-26 ENCOUNTER — Other Ambulatory Visit: Payer: Self-pay

## 2019-04-26 ENCOUNTER — Ambulatory Visit (INDEPENDENT_AMBULATORY_CARE_PROVIDER_SITE_OTHER): Payer: Medicare Other | Admitting: Internal Medicine

## 2019-04-26 VITALS — Ht 66.0 in | Wt 223.0 lb

## 2019-04-26 DIAGNOSIS — L987 Excessive and redundant skin and subcutaneous tissue: Secondary | ICD-10-CM

## 2019-04-26 DIAGNOSIS — L304 Erythema intertrigo: Secondary | ICD-10-CM

## 2019-04-26 DIAGNOSIS — M4807 Spinal stenosis, lumbosacral region: Secondary | ICD-10-CM

## 2019-04-26 DIAGNOSIS — Z791 Long term (current) use of non-steroidal anti-inflammatories (NSAID): Secondary | ICD-10-CM | POA: Diagnosis not present

## 2019-04-26 DIAGNOSIS — D508 Other iron deficiency anemias: Secondary | ICD-10-CM | POA: Diagnosis present

## 2019-04-26 DIAGNOSIS — I252 Old myocardial infarction: Secondary | ICD-10-CM | POA: Diagnosis not present

## 2019-04-26 DIAGNOSIS — Z79899 Other long term (current) drug therapy: Secondary | ICD-10-CM | POA: Diagnosis not present

## 2019-04-26 DIAGNOSIS — E538 Deficiency of other specified B group vitamins: Secondary | ICD-10-CM | POA: Insufficient documentation

## 2019-04-26 DIAGNOSIS — L309 Dermatitis, unspecified: Secondary | ICD-10-CM

## 2019-04-26 DIAGNOSIS — G43911 Migraine, unspecified, intractable, with status migrainosus: Secondary | ICD-10-CM

## 2019-04-26 DIAGNOSIS — J45909 Unspecified asthma, uncomplicated: Secondary | ICD-10-CM | POA: Insufficient documentation

## 2019-04-26 DIAGNOSIS — Z8249 Family history of ischemic heart disease and other diseases of the circulatory system: Secondary | ICD-10-CM | POA: Insufficient documentation

## 2019-04-26 DIAGNOSIS — Z9884 Bariatric surgery status: Secondary | ICD-10-CM | POA: Insufficient documentation

## 2019-04-26 DIAGNOSIS — Z8261 Family history of arthritis: Secondary | ICD-10-CM | POA: Insufficient documentation

## 2019-04-26 DIAGNOSIS — M545 Low back pain, unspecified: Secondary | ICD-10-CM

## 2019-04-26 DIAGNOSIS — R109 Unspecified abdominal pain: Secondary | ICD-10-CM | POA: Insufficient documentation

## 2019-04-26 DIAGNOSIS — Z833 Family history of diabetes mellitus: Secondary | ICD-10-CM | POA: Diagnosis not present

## 2019-04-26 DIAGNOSIS — D529 Folate deficiency anemia, unspecified: Secondary | ICD-10-CM

## 2019-04-26 DIAGNOSIS — G8929 Other chronic pain: Secondary | ICD-10-CM

## 2019-04-26 DIAGNOSIS — Z800 Family history of malignant neoplasm of digestive organs: Secondary | ICD-10-CM | POA: Diagnosis not present

## 2019-04-26 DIAGNOSIS — Z803 Family history of malignant neoplasm of breast: Secondary | ICD-10-CM | POA: Insufficient documentation

## 2019-04-26 DIAGNOSIS — R5383 Other fatigue: Secondary | ICD-10-CM | POA: Insufficient documentation

## 2019-04-26 LAB — CBC WITH DIFFERENTIAL/PLATELET
Abs Immature Granulocytes: 0.03 10*3/uL (ref 0.00–0.07)
Basophils Absolute: 0.1 10*3/uL (ref 0.0–0.1)
Basophils Relative: 1 %
Eosinophils Absolute: 0.3 10*3/uL (ref 0.0–0.5)
Eosinophils Relative: 4 %
HCT: 41.6 % (ref 36.0–46.0)
Hemoglobin: 13.1 g/dL (ref 12.0–15.0)
Immature Granulocytes: 0 %
Lymphocytes Relative: 33 %
Lymphs Abs: 2.8 10*3/uL (ref 0.7–4.0)
MCH: 29.7 pg (ref 26.0–34.0)
MCHC: 31.5 g/dL (ref 30.0–36.0)
MCV: 94.3 fL (ref 80.0–100.0)
Monocytes Absolute: 0.3 10*3/uL (ref 0.1–1.0)
Monocytes Relative: 4 %
Neutro Abs: 5 10*3/uL (ref 1.7–7.7)
Neutrophils Relative %: 58 %
Platelets: 215 10*3/uL (ref 150–400)
RBC: 4.41 MIL/uL (ref 3.87–5.11)
RDW: 12 % (ref 11.5–15.5)
WBC: 8.5 10*3/uL (ref 4.0–10.5)
nRBC: 0 % (ref 0.0–0.2)

## 2019-04-26 LAB — IRON AND TIBC
Iron: 80 ug/dL (ref 28–170)
Saturation Ratios: 23 % (ref 10.4–31.8)
TIBC: 344 ug/dL (ref 250–450)
UIBC: 264 ug/dL

## 2019-04-26 LAB — VITAMIN B12: Vitamin B-12: 908 pg/mL (ref 180–914)

## 2019-04-26 LAB — FERRITIN: Ferritin: 88 ng/mL (ref 11–307)

## 2019-04-26 MED ORDER — MUPIROCIN 2 % EX OINT: 1.0000 "application " | TOPICAL_OINTMENT | Freq: Three times a day (TID) | CUTANEOUS | 0 refills | Status: DC

## 2019-04-26 MED ORDER — TRIAMCINOLONE ACETONIDE 0.1 % EX CREA: 1.0000 "application " | TOPICAL_CREAM | Freq: Two times a day (BID) | CUTANEOUS | 0 refills | Status: DC

## 2019-04-26 NOTE — Progress Notes (Signed)
Virtual Visit via Video Note  I connected with Cheyenne Gray  on 04/26/19 at  3:20 PM EST by a video enabled telemedicine application and verified that I am speaking with the correct person using two identifiers.  Location patient: home Location provider:work or home office Persons participating in the virtual visit: patient, provider  I discussed the limitations of evaluation and management by telemedicine and the availability of in person appointments. The patient expressed understanding and agreed to proceed.   HPI: 1. Chronic back pain, Dr Humphrey Rolls is rec NS and Dr. Sharlet Salina has given her back injections 1 year ago and recently which 2nd round of back injections are helping but wants to f/u with Dr. Sharlet Salina.   04/14/18  IMPRESSION: MR THORACIC SPINE IMPRESSION  No change in mild spondylosis without central canal or foraminal narrowing as described above.  MR LUMBAR SPINE IMPRESSION  No change in spondylosis worst at L3-4 where there is moderately severe central canal stenosis due to a disc bulge, short pedicles and ligamentum flavum thickening.  No change in mild central canal and bilateral subarticular recess narrowing at L4-5 where there is a shallow broad-based central protrusion.  No change in mild bilateral foraminal narrowing at L5-S1.   2. C/o hand eczema with cracking tried cerave otc open cracks which are painful she is washing hands a lot   3. Increased frequency of migraines otc meds not helping last h/a lasted x 2 weeks and imitrex in the past has not helped wants to review other options with neurology    ROS: See pertinent positives and negatives per HPI.  Past Medical History:  Diagnosis Date  . (HFpEF) heart failure with preserved ejection fraction ([place])    a. Echo 2014: EF 65-70%, nl WM, mildly dilated LA, PASP nl; b. 12/2014 Echo: EF 65-70%, no rwma, mod septal hypertrophy w/o LVOT gradient or SAM; c. 07/2017 Echo: EF 55-60%, no rwma, mildly dil RV w/  nl syst fxn. Mildly dil RA. Dilated IVC w/ elevated CVP. Triv post effusion.  Marland Kitchen Anxiety   . Arthritis   . Asthma   . B12 deficiency   . Chronic back pain   . Chronic headaches   . Chronic pain    a. on methadone  . Concussion    hx of 4  . Coronary artery disease, non-occlusive    a. LHC 1/18: proximal to mid LAD 40% stenosed, mid LAD 30% stenosed, mid RCA 20% stenosed, distal RCA 20% stenosed, EF 55-65%, LVEDP normal  . Depression   . DJD (degenerative joint disease), multiple sites   . Frequent headaches   . Gallstone   . H/O non-ST elevation myocardial infarction (NSTEMI)   . Hand, foot and mouth disease 2016  . History of shingles   . Hypertension   . Iron deficiency anemia   . Long QT interval   . Methadone use ([place])    managed by Dr. Primus Bravo  . Migraine    1x/mo  . Motion sickness    cars  . Obesity   . Palpitations    a. 24 hour Holter: NSR, sinus brady down to 48, occasional PVCs & couplets, 8 beats NSVT; b. 30 day event monitor 2015: NSR with rare PVC.  Marland Kitchen Psoriasis   . Syncope and collapse   . Vitamin D deficiency   . Wears dentures    full upper and lower    Past Surgical History:  Procedure Laterality Date  . ABDOMINOPLASTY     tummy tuck ?  year   . BARIATRIC SURGERY  2001  . CARDIAC CATHETERIZATION Left 04/16/2016   Procedure: Left Heart Cath and Coronary Angiography;  Surgeon: [name], MD;  Location: [facility];  Service: Cardiovascular;  Laterality: Left;  . CHOLECYSTECTOMY  2001  . COLONOSCOPY WITH PROPOFOL N/A 09/28/2018   Procedure: COLONOSCOPY WITH PROPOFOL;  Surgeon: Virgel Manifold, MD;  Location: [facility];  Service: Endoscopy;  Laterality: N/A;  . ESOPHAGOGASTRODUODENOSCOPY (EGD) WITH PROPOFOL N/A 09/28/2018   Procedure: ESOPHAGOGASTRODUODENOSCOPY (EGD) WITH BIOPSY;  Surgeon: Virgel Manifold, MD;  Location: [facility];  Service: Endoscopy;  Laterality: N/A;  . GALLBLADDER SURGERY    . GASTRIC BYPASS   2001  . GASTROPLASTY      Family History  Problem Relation Age of Onset  . Heart attack Mother   . Cancer Mother        pancreatitic   . Early death Mother   . Heart attack Father 51       MI  . Early death Father   . Heart disease Father   . Heart attack Brother   . Heart disease Brother   . Arthritis Brother   . Depression Brother   . Diabetes Brother   . Heart attack Maternal Grandmother   . Cancer Maternal Grandmother        pancreatitic   . Heart disease Maternal Grandmother   . Cancer Maternal Uncle        pancreatitic   . Cancer Paternal Grandmother        ? type   . Diabetes Paternal Grandmother   . Cancer Maternal Uncle        pancreatitic   . 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  albuterol (PROVENTIL HFA;VENTOLIN HFA) 108 (90 BASE) MCG/ACT inhaler, Inhale 2 puffs into the lungs as needed for wheezing., Disp: , Rfl:  .  ARIPiprazole (ABILIFY) 2 MG tablet, Take 1 tablet (2 mg total) by mouth daily., Disp: 90 tablet, Rfl: 0 .  BLACK COHOSH PO, Take by mouth daily., Disp: , Rfl:  .  busPIRone (BUSPAR) 15 MG tablet, Take 1 tablet (15 mg total) by mouth 2 (two) times daily., Disp: 180 tablet, Rfl: 0 .  Cholecalciferol (VITAMIN D3) 125 MCG (5000 UT) TABS, Take by mouth daily., Disp: , Rfl:  .  diclofenac sodium (VOLTAREN) 1 % GEL, Apply 4 g topically 4 (four) times daily. Prn knees and 2 grams qid prn hands arthritis, Disp: 100 g, Rfl: 11 .  diclofenac Sodium (VOLTAREN) 1 % GEL, , Disp: , Rfl:  .  DULoxetine (CYMBALTA) 30 MG capsule, TAKE 3 CAPSULES BY MOUTH EVERY DAY, Disp: 225 capsule, Rfl: 2 .  EPINEPHrine 0.3 mg/0.3 mL IJ SOAJ injection, Inject 0.3 mLs (0.3 mg total) into the muscle as needed for  anaphylaxis., Disp: 2 each, Rfl: 0 .  furosemide (LASIX) 40 MG tablet, , Disp: , Rfl:  .  gabapentin (NEURONTIN) 400 MG capsule, Limit 2 tablets in the a.m. and midday and 3 tablets each evening    Please dispense a three-month supply, Disp: 630 capsule, Rfl: 0 .  isosorbide mononitrate (IMDUR) 60 MG 24 hr tablet, Take 1  tablet (60 mg total) by mouth daily., Disp: 90 tablet, Rfl: 0 .  methadone (DOLOPHINE) 10 MG tablet, Limit 1-2 tablets by mouth 2-3 times per day if tolerated, Disp: 180 tablet, Rfl: 0 .  nystatin cream (MYCOSTATIN), APPLY TO CONNER OF MOUTH 2 4 TIMES DAILY, Disp: , Rfl:  .  rOPINIRole (REQUIP) 0.25 MG tablet, Take 2-3 tablets (0.5-0.75 mg total) by mouth at bedtime., Disp: 90 tablet, Rfl: 11 .  rosuvastatin (CRESTOR) 10 MG tablet, TAKE 1 TABLET BY MOUTH EVERY DAY, Disp: 90 tablet, Rfl: 1 .  SUMAtriptan (IMITREX) 100 MG tablet, TAKE 1 TABLET BY MOUTH AS NEEDED FOR HEADACHE. MAY REPEAT IN 2 HRS IF NEEDED. MAX 200MG/DAY, Disp: , Rfl: 5 .  Suvorexant (BELSOMRA) 10 MG TABS, Take 10 mg by mouth at bedtime., Disp: 30 tablet, Rfl: 1 .  vitamin B-12 (CYANOCOBALAMIN) 500 MCG tablet, Take by mouth., Disp: , Rfl:  .  mupirocin ointment (BACTROBAN) 2 %, Apply 1 application topically 3 (three) times daily. Hands open wounds, Disp: 30 g, Rfl: 0 .  nitroGLYCERIN (NITROSTAT) 0.4 MG SL tablet, Place 1 tablet (0.4 mg total) under the tongue every 5 (five) minutes as needed for chest pain., Disp: 25 tablet, Rfl: 3 .  triamcinolone cream (KENALOG) 0.1 %, Apply 1 application topically 2 (two) times daily., Disp: 80 g, Rfl: 0  EXAM:  VITALS per patient if applicable:  GENERAL: alert, oriented, appears well and in no acute distress  HEENT: atraumatic, conjunttiva clear, no obvious abnormalities on inspection of external nose and ears  NECK: normal movements of the head and neck  LUNGS: on inspection no signs of respiratory distress, breathing rate appears normal, no obvious gross SOB, gasping or  wheezing  CV: no obvious cyanosis  MS: moves all visible extremities without noticeable abnormality  PSYCH/NEURO: pleasant and cooperative, no obvious depression or anxiety, speech and thought processing grossly intact  ASSESSMENT AND PLAN:  Discussed the following assessment and plan:  Intractable migraine with status migrainosus, unspecified migraine type - Plan: Ambulatory referral to Neurology  Chronic low back pain, unspecified back pain laterality, unspecified whether sciatica present - Plan: Ambulatory referral to Neurosurgery  Spinal stenosis of lumbosacral region - Plan: Ambulatory referral to Neurosurgery  Hand eczema - Plan: triamcinolone cream (KENALOG) 0.1 %, mupirocin ointment (BACTROBAN) 2 % vasoline with cotton gloves   HM Never gets flu shot  Tdap ? Had 2009/2010likely due for repeat -pt wants to wait as of 12/21/18 disc again today  considershingrix in future rec hep B vaccinelow titer 6 08/18/17 Hep A immune   Pap pt has not had in a while wants to think about it if needed consider OB/GYN vs PCPpt wants to [place] as of 12/21/18 and has not had a pap in > 19 years per pt h/o trauma and hard to do paps she will think about this and we will re discuss   Colonoscopy 09/28/18/EGD poor prep   needs repeat colonoscopy done 09/28/18 and repeat sch 01/2019 Dr. Earley Favor GI  EGD 09/28/18 neg bx for eos esophagitis   Mamm[place]7/16/19 [place]   DEXA7/16/19 normal  Of notehome sleep studyordered by lung MD Dr. Juanell Fairly never done from 08/2017   consider HIV check in the future   -we discussed possible serious and likely etiologies, options for evaluation and workup, limitations of telemedicine visit vs in person visit, treatment, treatment risks and precautions. Pt prefers to treat via telemedicine empirically rather then risking or undertaking an in person  visit at this moment. Patient agrees to seek prompt in  person care if worsening, new symptoms arise, or if is not improving with treatment.   I discussed the assessment and treatment plan with the patient. The patient was provided an opportunity to ask questions and all were answered. The patient agreed with the plan and demonstrated an understanding of the instructions.   The patient was advised to call back or seek an in-person evaluation if the symptoms worsen or if the condition fails to improve as anticipated.  Time spent 20-29 minutes Delorise Jackson, MD

## 2019-04-27 ENCOUNTER — Inpatient Hospital Stay (HOSPITAL_BASED_OUTPATIENT_CLINIC_OR_DEPARTMENT_OTHER): Payer: Medicare Other | Admitting: Oncology

## 2019-04-27 ENCOUNTER — Encounter: Payer: Self-pay | Admitting: Oncology

## 2019-04-27 ENCOUNTER — Ambulatory Visit: Payer: Medicare Other | Admitting: Licensed Clinical Social Worker

## 2019-04-27 DIAGNOSIS — Z9884 Bariatric surgery status: Secondary | ICD-10-CM | POA: Diagnosis not present

## 2019-04-27 DIAGNOSIS — D508 Other iron deficiency anemias: Secondary | ICD-10-CM

## 2019-04-27 DIAGNOSIS — R5383 Other fatigue: Secondary | ICD-10-CM | POA: Diagnosis not present

## 2019-04-27 NOTE — Progress Notes (Signed)
HEMATOLOGY-ONCOLOGY TeleHEALTH VISIT PROGRESS NOTE  I connected with Cheyenne Gray on 04/27/19 at  2:30 PM EST by video enabled telemedicine visit and verified that I am speaking with the correct person using two identifiers. I discussed the limitations, risks, security and privacy concerns of performing an evaluation and management service by telemedicine and the availability of in-person appointments. I also discussed with the patient that there may be a patient responsible charge related to this service. The patient expressed understanding and agreed to proceed.   Other persons participating in the visit and their role in the encounter:  None  Patient's location: Home  Provider's location: office Chief Complaint:    INTERVAL HISTORY Cheyenne Gray is a 54 y.o. female who has above history reviewed by me today presents for follow up visit for management of iron deficiency anemia, history of gastric bypass Problems and complaints are listed below:  Patient called and reported feeling fatigued and request appointment to be moved up. She had lab work done yesterday and presents virtually to discuss results Appetite is fair.  Weight is stable.  No intentional weight loss. She has intermittent abdominal pain, for about a year.  No exacerbation or alleviating factors.  Review of Systems  Constitutional: Positive for fatigue. Negative for appetite change, chills and fever.  HENT:   Negative for hearing loss and voice change.   Eyes: Negative for eye problems.  Respiratory: Negative for chest tightness and cough.   Cardiovascular: Negative for chest pain.  Gastrointestinal: Negative for abdominal distention and blood in stool.  Endocrine: Negative for hot flashes.  Genitourinary: Negative for difficulty urinating and frequency.   Musculoskeletal: Negative for arthralgias.  Skin: Negative for itching and rash.  Neurological: Negative for extremity weakness.  Hematological: Negative for  adenopathy.  Psychiatric/Behavioral: Negative for confusion.    Past Medical History:  Diagnosis Date  . (HFpEF) heart failure with preserved ejection fraction ([place])    a. Echo 2014: EF 65-70%, nl WM, mildly dilated LA, PASP nl; b. 12/2014 Echo: EF 65-70%, no rwma, mod septal hypertrophy w/o LVOT gradient or SAM; c. 07/2017 Echo: EF 55-60%, no rwma, mildly dil RV w/ nl syst fxn. Mildly dil RA. Dilated IVC w/ elevated CVP. Triv post effusion.  Marland Kitchen Anxiety   . Arthritis   . Asthma   . B12 deficiency   . Chronic back pain   . Chronic headaches   . Chronic pain    a. on methadone  . Concussion    hx of 4  . Coronary artery disease, non-occlusive    a. LHC 1/18: proximal to mid LAD 40% stenosed, mid LAD 30% stenosed, mid RCA 20% stenosed, distal RCA 20% stenosed, EF 55-65%, LVEDP normal  . Depression   . DJD (degenerative joint disease), multiple sites   . Frequent headaches   . Gallstone   . H/O non-ST elevation myocardial infarction (NSTEMI)   . Hand, foot and mouth disease 2016  . History of shingles   . Hypertension   . Iron deficiency anemia   . Long QT interval   . Methadone use ([place])    managed by Dr. Primus Bravo  . Migraine    1x/mo  . Motion sickness    cars  . Obesity   . Palpitations    a. 24 hour Holter: NSR, sinus brady down to 48, occasional PVCs & couplets, 8 beats NSVT; b. 30 day event monitor 2015: NSR with rare PVC.  Marland Kitchen Psoriasis   . Syncope and collapse   .  Vitamin D deficiency   . Wears dentures    full upper and lower   Past Surgical History:  Procedure Laterality Date  . ABDOMINOPLASTY     tummy tuck ? year   . [place] SURGERY  2001  . CARDIAC CATHETERIZATION Left 04/16/2016   Procedure: Left Heart Cath and Coronary Angiography;  Surgeon: [name], MD;  Location: [facility];  Service: Cardiovascular;  Laterality: Left;  . CHOLECYSTECTOMY  2001  . COLONOSCOPY WITH PROPOFOL N/A 09/28/2018   Procedure: COLONOSCOPY WITH PROPOFOL;  Surgeon:  Virgel Manifold, MD;  Location: [facility];  Service: Endoscopy;  Laterality: N/A;  . ESOPHAGOGASTRODUODENOSCOPY (EGD) WITH PROPOFOL N/A 09/28/2018   Procedure: ESOPHAGOGASTRODUODENOSCOPY (EGD) WITH BIOPSY;  Surgeon: Virgel Manifold, MD;  Location: [facility];  Service: Endoscopy;  Laterality: N/A;  . GALLBLADDER SURGERY    . GASTRIC BYPASS  2001  . GASTROPLASTY      Family History  Problem Relation Age of Onset  . Heart attack Mother   . Cancer Mother        pancreatitic   . Early death Mother   . Heart attack Father 8       MI  . Early death Father   . Heart disease Father   . Heart attack Brother   . Heart disease Brother   . Arthritis Brother   . Depression Brother   . Diabetes Brother   . Heart attack Maternal Grandmother   . Cancer Maternal Grandmother        pancreatitic   . Heart disease Maternal Grandmother   . Cancer Maternal Uncle        pancreatitic   . Cancer Paternal Grandmother        ? type   . Diabetes Paternal Grandmother   . Cancer Maternal Uncle        pancreatitic   . Breast cancer Maternal Aunt     Social History   Socioeconomic History  . Marital status: Widowed    Spouse name: Not on file  . Number of children: 1  . Years of education: assoc degree  . Highest education level: Associate degree: occupational, Hotel manager, or vocational program  Occupational History  . Not on file  Tobacco Use  . Smoking status: Never Smoker  . Smokeless tobacco: Never Used  Substance and Sexual Activity  . Alcohol use: No    Alcohol/week: 0.0 standard drinks    Comment: holidays  . Drug use: No  . Sexual activity: Not Currently  Other Topics Concern  . 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 Difficulty of Paying Living Expenses: Not on file  Food Insecurity:   . Worried About Charity fundraiser in the Last Year: Not on file  . Ran Out of Food in the Last Year: Not on file  Transportation Needs:   . Lack of Transportation (Medical): Not on file  . Lack of Transportation (Non-Medical): Not on file  Physical Activity:   . Days of Exercise per Week: Not on file  . Minutes of  Exercise per Session: Not on file  Stress:   . Feeling of Stress : Not on file  Social Connections:   . Frequency of Communication with Friends and Family: Not on file  . Frequency of Social Gatherings with Friends and Family: Not on file  . Attends Religious Services: Not on file  . Active Member of Clubs or Organizations: Not on file  . Attends Archivist Meetings: Not on file  . Marital Status: Not on file  Intimate Partner Violence:   . Fear of Current or Ex-Partner: Not on file  . Emotionally Abused: Not on file  . Physically Abused: Not on file  . Sexually Abused: Not on file    Current Outpatient Medications on File Prior to Visit  Medication Sig Dispense Refill  . albuterol (PROVENTIL HFA;VENTOLIN HFA) 108 (90 BASE) MCG/ACT inhaler Inhale 2 puffs into the lungs as needed for wheezing.    . ARIPiprazole (ABILIFY) 2 MG tablet Take 1 tablet (2 mg total) by mouth daily. 90 tablet 0  . busPIRone (BUSPAR) 15 MG tablet Take 1 tablet (15 mg total) by mouth 2 (two) times daily. 180 tablet 0  . Cholecalciferol (VITAMIN D3) 125 MCG (5000 UT) TABS Take by mouth daily.    . diclofenac sodium (VOLTAREN) 1 % GEL Apply 4 g topically 4 (four) times daily. Prn knees and 2 grams qid prn hands arthritis 100 g 11  . DULoxetine (CYMBALTA) 30 MG capsule TAKE 3 CAPSULES BY MOUTH EVERY DAY 225 capsule 2  . EPINEPHrine 0.3 mg/0.3 mL IJ SOAJ injection Inject 0.3 mLs (0.3 mg total) into the muscle as needed for anaphylaxis. 2 each 0  .  furosemide (LASIX) 40 MG tablet     . gabapentin (NEURONTIN) 400 MG capsule Limit 2 tablets in the a.m. and midday and 3 tablets each evening    Please dispense a three-month supply 630 capsule 0  . isosorbide mononitrate (IMDUR) 60 MG 24 hr tablet Take 1 tablet (60 mg total) by mouth daily. 90 tablet 0  . methadone (DOLOPHINE) 10 MG tablet Limit 1-2 tablets by mouth 2-3 times per day if tolerated 180 tablet 0  . mupirocin ointment (BACTROBAN) 2 % Apply 1 application topically 3 (three) times daily. Hands open wounds 30 g 0  . rOPINIRole (REQUIP) 0.25 MG tablet Take 2-3 tablets (0.5-0.75 mg total) by mouth at bedtime. 90 tablet 11  . rosuvastatin (CRESTOR) 10 MG tablet TAKE 1 TABLET BY MOUTH EVERY DAY 90 tablet 1  . Suvorexant (BELSOMRA) 10 MG TABS Take 10 mg by mouth at bedtime. 30 tablet 1  . triamcinolone cream (KENALOG) 0.1 % Apply 1 application topically 2 (two) times daily. 80 g 0  . vitamin B-12 (CYANOCOBALAMIN) 500 MCG tablet Take by mouth.    Marland Kitchen BLACK COHOSH PO Take by mouth daily.    . diclofenac Sodium (VOLTAREN) 1 % GEL     . nitroGLYCERIN (NITROSTAT) 0.4 MG SL tablet Place 1 tablet (0.4 mg total) under the tongue every 5 (five) minutes as needed for chest pain. 25 tablet 3  . nystatin cream (MYCOSTATIN) APPLY TO CONNER OF MOUTH 2 4 TIMES DAILY    . SUMAtriptan (IMITREX) 100 MG tablet TAKE 1 TABLET BY MOUTH AS NEEDED FOR HEADACHE. MAY REPEAT IN 2 HRS IF NEEDED. MAX 200MG/DAY  5   No current facility-administered medications on file prior to visit.    Allergies  Allergen Reactions  . Penicillins Hives, Shortness Of Breath and Rash  Has patient had a PCN reaction causing immediate rash, facial/tongue/throat swelling, SOB or lightheadedness with hypotension: Yes Has patient had a PCN reaction causing severe rash involving mucus membranes or skin necrosis: No Has patient had a PCN reaction that required hospitalization No Has patient had a PCN reaction occurring within the last 10  years: No If all of the above answers are "NO", then may proceed with Cephalosporin use.   . Bee Venom        Observations/Objective: Today's Vitals   04/27/19 1404  PainSc: 0-No pain   There is no height or weight on file to calculate BMI.  Physical Exam  Constitutional: No distress.  Neurological: She is alert.    CBC    Component Value Date/Time   WBC 8.5 04/26/2019 1310   RBC 4.41 04/26/2019 1310   HGB 13.1 04/26/2019 1310   HGB 12.8 03/28/2016 1519   HCT 41.6 04/26/2019 1310   HCT 37.8 03/28/2016 1519   PLT 215 04/26/2019 1310   PLT 272 03/28/2016 1519   MCV 94.3 04/26/2019 1310   MCV 88 03/28/2016 1519   MCV 91 01/02/2014 1156   MCH 29.7 04/26/2019 1310   MCHC 31.5 04/26/2019 1310   RDW 12.0 04/26/2019 1310   RDW 13.7 03/28/2016 1519   RDW 12.8 01/02/2014 1156   LYMPHSABS 2.8 04/26/2019 1310   LYMPHSABS 2.9 03/28/2016 1519   LYMPHSABS 2.3 01/02/2014 1156   MONOABS 0.3 04/26/2019 1310   MONOABS 0.5 01/02/2014 1156   EOSABS 0.3 04/26/2019 1310   EOSABS 0.4 03/28/2016 1519   EOSABS 0.2 01/02/2014 1156   BASOSABS 0.1 04/26/2019 1310   BASOSABS 0.0 03/28/2016 1519   BASOSABS 0.1 01/02/2014 1156    CMP     Component Value Date/Time   NA 141 01/16/2019 0142   NA 142 03/28/2016 1519   K 3.7 01/16/2019 0142   CL 101 01/16/2019 0142   CO2 30 01/16/2019 0142   GLUCOSE 93 01/16/2019 0142   BUN 9 01/16/2019 0142   BUN 5 (L) 03/28/2016 1519   CREATININE 0.92 01/16/2019 0142   CREATININE 0.86 08/18/2017 0954   CALCIUM 9.4 01/16/2019 0142   PROT 7.9 01/16/2019 0142   PROT 6.3 05/31/2018 1144   PROT 6.4 05/04/2013 1556   ALBUMIN 4.1 01/16/2019 0142   ALBUMIN 4.0 05/31/2018 1144   ALBUMIN 3.1 (L) 05/04/2013 1556   AST 29 01/16/2019 0142   AST 38 (H) 05/04/2013 1556   ALT 15 01/16/2019 0142   ALT 39 05/04/2013 1556   ALKPHOS 99 01/16/2019 0142   ALKPHOS 84 05/04/2013 1556   BILITOT 0.4 01/16/2019 0142   BILITOT 0.4 05/31/2018 1144   BILITOT 0.3  05/04/2013 1556   GFRNONAA >60 01/16/2019 0142   GFRNONAA 79 08/18/2017 0954   GFRAA >60 01/16/2019 0142   GFRAA 92 08/18/2017 0954     Assessment and Plan: 1. Other iron deficiency anemia   2. Other fatigue   3. Hx of gastric bypass     Labs reviewed and discussed with patient. Patient has normal hemoglobin level at 13.1, iron panel shows ferritin 88, saturation ratio 23. No need for IV iron at this point.  History of gastric bypass.  Continue vitamin B12 supplements She had endoscopy and colonoscopy done by Dr. Bonna Gains.  Colonoscopy prep was poor and patient was recommended to follow-up with gastroenterology and reschedule and have another colonoscopy this year.  Fatigue, etiology unknown.  Not due to anemia.  TSH was checked 6 months ago and  was normal.  Recommend patient to follow-up with primary care provider for further work-up if her symptoms persist.  Follow Up Instructions: 6 months   I discussed the assessment and treatment plan with the patient. The patient was provided an opportunity to ask questions and all were answered. The patient agreed with the plan and demonstrated an understanding of the instructions.  The patient was advised to call back or seek an in-person evaluation if the symptoms worsen or if the condition fails to improve as anticipated.    Earlie Server, MD 04/27/2019 8:30 PM

## 2019-04-27 NOTE — Progress Notes (Signed)
Patient reports random body jerking and feeling exhausted is wondering if related to low iron.

## 2019-05-03 ENCOUNTER — Encounter: Payer: Self-pay | Admitting: Internal Medicine

## 2019-05-04 ENCOUNTER — Ambulatory Visit (INDEPENDENT_AMBULATORY_CARE_PROVIDER_SITE_OTHER): Payer: Medicare Other | Admitting: Family Medicine

## 2019-05-04 ENCOUNTER — Encounter: Payer: Self-pay | Admitting: Family Medicine

## 2019-05-04 ENCOUNTER — Other Ambulatory Visit: Payer: Self-pay

## 2019-05-04 DIAGNOSIS — R42 Dizziness and giddiness: Secondary | ICD-10-CM

## 2019-05-04 DIAGNOSIS — R1010 Upper abdominal pain, unspecified: Secondary | ICD-10-CM

## 2019-05-04 MED ORDER — FAMOTIDINE 20 MG PO TABS: 20.0000 mg | ORAL_TABLET | Freq: Two times a day (BID) | ORAL | 1 refills | Status: DC

## 2019-05-04 NOTE — Progress Notes (Signed)
Virtual Visit via Video Note  I connected with Cheyenne Gray on 05/04/19 at 12:00 PM EST by a video enabled telemedicine application and verified that I am speaking with the correct person using two identifiers.  Location: Patient: home Provider: office   I discussed the limitations of evaluation and management by telemedicine and the availability of in person appointments. The patient expressed understanding and agreed to proceed.  Parties involved in encounter  Patient: Cheyenne Gray  Provider:  Roxy Manns MD    History of Present Illness: 54 yo pt of Cheyenne Gray presents with several c/o  Upper abdominal pan and dizziness   She does get dizzy with migraines  Noticed dizziness with driving - when she turned her head  Thinks it is from migraine and poor sleep  Today -improved   Last CT shows aortic atherosclerosis w/o aneurysm or dissection  3/20    Sunday she had episode of pain in her stomach area  By Monday pain was worse -has had it ever since   Right side- upper abdomen -and moves to the left side (now worse on the left) Worse when she moves around - ? 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 Upper abdominal pain  05/04/2019  . Dizziness 05/04/2019  . Hand eczema 04/26/2019  . At risk for long QT syndrome 12/27/2018  . Obesity (BMI 30-39.9) 12/21/2018  . Excess skin 12/21/2018  . Allergy to honey bee venom 12/21/2018  . Abnormal CT scan, colon   . PTSD (post-traumatic stress disorder) 09/16/2018  . GAD (generalized anxiety disorder) 09/16/2018  . MDD (major depressive disorder), recurrent episode, moderate (HCC) 09/16/2018  . 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  . Hot flashes due to menopause 09/09/2018  . Memory loss 09/09/2018  . Intertrigo 09/09/2018  . Fatigue 07/14/2018  . Insomnia due to mental condition 07/14/2018  . Abnormal CT of the abdomen 07/14/2018  . Coronary artery disease of native artery of native heart with stable angina pectoris (HCC) 06/09/2018  . Abnormal MRI, lumbar spine 03/30/2018  . Abnormal MRI, thoracic spine 03/30/2018  . 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/30/2018  . Hypokalemia 03/30/2018  . Edema 10/01/2017  . Anxiety and depression 09/28/2017  . Vitamin D deficiency 09/28/2017  . Chronic heart failure with preserved ejection fraction (HCC) 08/18/2017  . Chest pain   . Positive cardiac stress test   . Spinal stenosis, lumbar region, with neurogenic claudication 10/18/2015  . Lumbar radiculopathy 10/18/2015  . Migraine 09/21/2015  . Chronic tension-type headache, intractable 08/08/2015  . Medication overuse headache 08/08/2015  .  Bilateral occipital neuralgia 06/04/2015  . Migraine headache 06/04/2015  . Angina pectoris (HCC) 02/07/2015  . IDA (iron deficiency anemia) 09/19/2014  . 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 DDD (degenerative disc disease), thoracolumbar 09/14/2014  . Sacroiliac joint dysfunction 09/14/2014  . Facet syndrome, lumbar 09/14/2014  . Mixed hyperlipidemia 01/25/2014  . PVC (premature ventricular contraction) 01/10/2014  . CAD (coronary artery disease) 04/22/2013  . PVD (peripheral vascular disease) (HCC) 04/22/2013  . Long Q-T  syndrome 07/09/2012  . Chest pressure 07/09/2012  . NSVT (nonsustained ventricular tachycardia) (HCC) 07/09/2012  . Syncope 07/09/2012  . MUSCLE STRAIN 07/20/2009   Past Medical History:  Diagnosis Date  . (HFpEF) heart failure with preserved ejection fraction (HCC)    a. Echo 2014: EF 65-70%, nl WM, mildly dilated LA, PASP nl; b. 12/2014 Echo: EF 65-70%, no rwma, mod septal hypertrophy w/o LVOT gradient or SAM; c. 07/2017 Echo: EF 55-60%, no rwma, mildly dil RV w/ nl syst fxn. Mildly dil RA. Dilated IVC w/ elevated CVP. Triv post effusion.  Marland Kitchen Anxiety   . Arthritis   . Asthma   . B12 deficiency   . Chronic back pain   . Chronic headaches   . Chronic pain    a. on methadone  . Concussion    hx of 4  . Coronary artery disease, non-occlusive    a. LHC 1/18: proximal to mid LAD 40% stenosed, mid LAD 30% stenosed, mid RCA 20% stenosed, distal RCA 20% stenosed, EF 55-65%, LVEDP normal  . Depression   . DJD (degenerative joint disease), multiple sites   . Frequent headaches   . Gallstone   . H/O non-ST elevation myocardial infarction (NSTEMI)   . Hand, foot and mouth disease 2016  . History of shingles   . Hypertension   . Iron deficiency anemia   . Long QT interval   . Methadone use (HCC)    managed by Cheyenne. Metta Clines  . Migraine    1x/mo  . Motion sickness    cars  . Obesity   . Palpitations    a. 24 hour Holter: NSR, sinus brady down to 48, occasional PVCs & couplets, 8 beats NSVT; b. 30 day event monitor 2015: NSR with rare PVC.  Marland Kitchen Psoriasis   . Syncope and collapse   . Vitamin D deficiency   . Wears dentures    full upper and lower   Past Surgical History:  Procedure Laterality Date  . ABDOMINOPLASTY     tummy tuck ? year   . BARIATRIC SURGERY  2001  . CARDIAC CATHETERIZATION Left 04/16/2016   Procedure: Left Heart Cath and Coronary Angiography;  Surgeon: Antonieta Iba, MD;  Location: [facility];  Service: Cardiovascular;  Laterality: Left;  . CHOLECYSTECTOMY   2001  . COLONOSCOPY WITH PROPOFOL N/A 09/28/2018   Procedure: COLONOSCOPY WITH PROPOFOL;  Surgeon: Pasty Spillers, MD;  Location: [facility];  Service: Endoscopy;  Laterality: N/A;  . ESOPHAGOGASTRODUODENOSCOPY (EGD) WITH PROPOFOL N/A 09/28/2018   Procedure: ESOPHAGOGASTRODUODENOSCOPY (EGD) WITH BIOPSY;  Surgeon: Pasty Spillers, MD;  Location: [facility];  Service: Endoscopy;  Laterality: N/A;  . GALLBLADDER SURGERY    . GASTRIC BYPASS  2001  . GASTROPLASTY     Social History   Tobacco Use  . Smoking status: Never Smoker  . Smokeless tobacco: Never Used  Substance Use Topics  . Alcohol use: No    Alcohol/week: 0.0 standard drinks    Comment: holidays  .  Drug use: No   Family History  Problem Relation Age of Onset  . Heart attack Mother   . Cancer Mother        pancreatitic   . Early death Mother   . Heart attack Father 68       MI  . Early death Father   . Heart disease Father   . Heart attack Brother   . Heart disease Brother   . Arthritis Brother   . Depression Brother   . Diabetes Brother   . Heart attack Maternal Grandmother   . Cancer Maternal Grandmother        pancreatitic   . Heart disease Maternal Grandmother   . Cancer Maternal Uncle        pancreatitic   . Cancer Paternal Grandmother        ? type   . Diabetes Paternal Grandmother   . Cancer Maternal Uncle        pancreatitic   . Breast cancer Maternal Aunt    Allergies  Allergen Reactions  . Penicillins Hives, Shortness Of Breath and Rash    Has patient had a PCN reaction causing immediate rash, facial/tongue/throat swelling, SOB or lightheadedness with hypotension: Yes Has patient had a PCN reaction causing severe rash involving mucus membranes or skin necrosis: No Has patient had a PCN reaction that required hospitalization No Has patient had a PCN reaction occurring within the last 10 years: No If all of the above answers are "NO", then may proceed with Cephalosporin  use.   Alphonsus Sias Venom    Current Outpatient Medications on File Prior to Visit  Medication Sig Dispense Refill  . albuterol (PROVENTIL HFA;VENTOLIN HFA) 108 (90 BASE) MCG/ACT inhaler Inhale 2 puffs into the lungs as needed for wheezing.    . ARIPiprazole (ABILIFY) 2 MG tablet Take 1 tablet (2 mg total) by mouth daily. 90 tablet 0  . BLACK COHOSH PO Take by mouth daily.    . busPIRone (BUSPAR) 15 MG tablet Take 1 tablet (15 mg total) by mouth 2 (two) times daily. 180 tablet 0  . Cholecalciferol (VITAMIN D3) 125 MCG (5000 UT) TABS Take by mouth daily.    . diclofenac sodium (VOLTAREN) 1 % GEL Apply 4 g topically 4 (four) times daily. Prn knees and 2 grams qid prn hands arthritis 100 g 11  . diclofenac Sodium (VOLTAREN) 1 % GEL     . DULoxetine (CYMBALTA) 30 MG capsule TAKE 3 CAPSULES BY MOUTH EVERY DAY 225 capsule 2  . EPINEPHrine 0.3 mg/0.3 mL IJ SOAJ injection Inject 0.3 mLs (0.3 mg total) into the muscle as needed for anaphylaxis. 2 each 0  . furosemide (LASIX) 40 MG tablet     . gabapentin (NEURONTIN) 400 MG capsule Limit 2 tablets in the a.m. and midday and 3 tablets each evening    Please dispense a three-month supply 630 capsule 0  . isosorbide mononitrate (IMDUR) 60 MG 24 hr tablet Take 1 tablet (60 mg total) by mouth daily. 90 tablet 0  . methadone (DOLOPHINE) 10 MG tablet Limit 1-2 tablets by mouth 2-3 times per day if tolerated 180 tablet 0  . mupirocin ointment (BACTROBAN) 2 % Apply 1 application topically 3 (three) times daily. Hands open wounds 30 g 0  . nystatin cream (MYCOSTATIN) APPLY TO CONNER OF MOUTH 2 4 TIMES DAILY    . rOPINIRole (REQUIP) 0.25 MG tablet Take 2-3 tablets (0.5-0.75 mg total) by mouth at bedtime. 90 tablet 11  . rosuvastatin (  CRESTOR) 10 MG tablet TAKE 1 TABLET BY MOUTH EVERY DAY 90 tablet 1  . SUMAtriptan (IMITREX) 100 MG tablet TAKE 1 TABLET BY MOUTH AS NEEDED FOR HEADACHE. MAY REPEAT IN 2 HRS IF NEEDED. MAX 200MG /DAY  5  . Suvorexant (BELSOMRA) 10 MG TABS  Take 10 mg by mouth at bedtime. 30 tablet 1  . triamcinolone cream (KENALOG) 0.1 % Apply 1 application topically 2 (two) times daily. 80 g 0  . vitamin B-12 (CYANOCOBALAMIN) 500 MCG tablet Take by mouth.    . nitroGLYCERIN (NITROSTAT) 0.4 MG SL tablet Place 1 tablet (0.4 mg total) under the tongue every 5 (five) minutes as needed for chest pain. 25 tablet 3   No current facility-administered medications on file prior to visit.   Review of Systems  Constitutional: Negative for chills, fever and malaise/fatigue.  HENT: Negative for congestion, ear pain, sinus pain and sore throat.   Eyes: Negative for blurred vision, discharge and redness.  Respiratory: Negative for cough, shortness of breath and stridor.   Cardiovascular: Negative for chest pain, palpitations and leg swelling.  Gastrointestinal: Positive for abdominal pain. Negative for blood in stool, constipation, diarrhea, heartburn, melena, nausea and vomiting.  Musculoskeletal: Negative for myalgias.  Skin: Negative for rash.  Neurological: Positive for dizziness. Negative for weakness and headaches.      Observations/Objective: Patient appears well, in no distress Weight is baseline (obese) No facial swelling or asymmetry Normal voice-not hoarse and no slurred speech No obvious tremor or mobility impairment Moving neck and UEs normally Able to hear the call well  No cough or shortness of breath during interview  Pt notes pain when she presses on upper abdomen on the R and L sides -not in the middle  Talkative and mentally sharp with no cognitive changes No skin changes on face or neck , no rash or pallor Affect is normal    Assessment and Plan: Problem List Items Addressed This Visit      Other   Upper abdominal pain - Primary    Per pt starting on R side then moved to the L and now worse on the L with some tenderness Wondered if muscular since it worsens with movement  Also h/o gastric bypass (her last egd looked fairly  nl in 7/20)  She is taking ibuprofen regularly w/o food Suspect possible gastritis Disc less likely AAA- rev last CT and none seen  Will tx with pepcid 20 mg bid  inst to hold all nsaids  Keep diet bland F/u with pcp next week inst to call and go to ER if this becomes worse - she voiced understanding  eval is limited since virtual today      Dizziness    Pt thinks this is most likely from migraines and lack of sleep  Improved today (was primarily yesterday) Urged her to call /come in or go to ER if worse  F/u with PCP next week          Follow Up Instructions:  suspect your abdominal pain may be acid related and worsened by the motrin Hold that medication and any other anti inflammatory medicines (like aspirin or aleve)   I sent px pepcid to your pharmacy -take 20 mg twice daily  Follow up with your primary care provider next week  If symptoms worsen-please alert the office and go to the ER if needed  (especially if pain is severe)   I'm glad that dizziness is improved but if it worsens again please  alert the office and see your primary provider as well  Try and drink more water    I discussed the assessment and treatment plan with the patient. The patient was provided an opportunity to ask questions and all were answered. The patient agreed with the plan and demonstrated an understanding of the instructions.   The patient was advised to call back or seek an in-person evaluation if the symptoms worsen or if the condition fails to improve as anticipated.     Roxy Manns, MD

## 2019-05-04 NOTE — Assessment & Plan Note (Signed)
Per pt starting on R side then moved to the L and now worse on the L with some tenderness Wondered if muscular since it worsens with movement  Also h/o gastric bypass (her last egd looked fairly nl in 7/20)  She is taking ibuprofen regularly w/o food Suspect possible gastritis Disc less likely AAA- rev last CT and none seen  Will tx with pepcid 20 mg bid  inst to hold all nsaids  Keep diet bland F/u with pcp next week inst to call and go to ER if this becomes worse - she voiced understanding  eval is limited since virtual today

## 2019-05-04 NOTE — Assessment & Plan Note (Signed)
Pt thinks this is most likely from migraines and lack of sleep  Improved today (was primarily yesterday) Urged her to call /come in or go to ER if worse  F/u with PCP next week

## 2019-05-04 NOTE — Patient Instructions (Signed)
I suspect your abdominal pain may be acid related and worsened by the motrin Hold that medication and any other anti inflammatory medicines (like aspirin or aleve)   I sent px pepcid to your pharmacy -take 20 mg twice daily  Follow up with your primary care provider next week  If symptoms worsen-please alert the office and go to the ER if needed  (especially if pain is severe)   I'm glad that dizziness is improved but if it worsens again please alert the office and see your primary provider as well  Try and drink more water

## 2019-05-08 NOTE — Progress Notes (Deleted)
Cardiology Office Note    Date:  05/08/2019   ID:  Cheyenne Gray, DOB 07-23-1965, MRN [id number]  PCP:  McLean-Scocuzza, Nino Glow, MD  Cardiologist:  Ida Rogue, MD  Electrophysiologist:  None   Chief Complaint: ***  History of Present Illness:   Cheyenne Gray is a 54 y.o. 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   Prior 30-day event monitor, post bariatric surgery for palpitations showed PVCs without significant arrhythmia.  Echo from 2016 showed normal LVSF. She underwent LHC in 2018 for chest pain that showed nonobstructive CAD as detailed below. Echo from 07/2017, performed in the setting of weight gain and lower extremity edema, showed normal LVSF. With initiation of Lasix, she had significant improvement in her swelling and a 15 pound weight loss. She has stable chest pain and takes 2-3 SL NTG per month.  She was seen in 02/2018 noting an increase in chest pain and palpitations.  Lexiscan Myoview and Zio patch were recommended, though has not yet been completed.  She was last seen virtually in 05/2018, by Dr. Rockey Situ.  At that time, she had recently undergone a CT chest/abdomen/pelvis for abdominal pain, which was not acute and showed mild RCA calcification, mild descending aortic atherosclerosis, mild disease at the bifurcation of the iliacs, as well as mild disease of the common iliacs.  She was overall doing well.  Given elevated LFTs, her Crestor was stopped with subsequent normalization of LFT.   ***   Labs independently reviewed: 04/2019 - HGB 13.1, PLT 215 01/2019 - albumin 4.1, AST/ALT normal 08/2018 - TC 176, TG 113, HDL 57, LDL 95, A1c 5.3 05/2018 - AST 167, ALT 77  Past Medical History:  Diagnosis Date  . (HFpEF) heart failure with  preserved ejection fraction ([place])    a. Echo 2014: EF 65-70%, nl WM, mildly dilated LA, PASP nl; b. 12/2014 Echo: EF 65-70%, no rwma, mod septal hypertrophy w/o LVOT gradient or SAM; c. 07/2017 Echo: EF 55-60%, no rwma, mildly dil RV w/ nl syst fxn. Mildly dil RA. Dilated IVC w/ elevated CVP. Triv post effusion.  Marland Kitchen Anxiety   . Arthritis   . Asthma   . B12 deficiency   . Chronic back pain   . Chronic headaches   . Chronic pain    a. on methadone  . Concussion    hx of 4  . Coronary artery disease, non-occlusive    a. LHC 1/18: proximal to mid LAD 40% stenosed, mid LAD 30% stenosed, mid RCA 20% stenosed, distal RCA 20% stenosed, EF 55-65%, LVEDP normal  . Depression   . DJD (degenerative joint disease), multiple sites   . Frequent headaches   . Gallstone   . H/O non-ST elevation myocardial infarction (NSTEMI)   . Hand, foot and mouth disease 2016  . History of shingles   . Hypertension   . Iron deficiency anemia   . Long QT interval   . Methadone use ([place])    managed by Dr. Primus Bravo  . Migraine    1x/mo  . Motion sickness    cars  . Obesity   . Palpitations    a. 24 hour Holter: NSR, sinus brady down to 48, occasional PVCs & couplets, 8 beats NSVT; b. 30 day event monitor 2015: NSR with rare PVC.  Marland Kitchen  Psoriasis   . Syncope and collapse   . Vitamin D deficiency   . Wears dentures    full upper and lower    Past Surgical History:  Procedure Laterality Date  . ABDOMINOPLASTY     tummy tuck ? year   . [place] SURGERY  2001  . CARDIAC CATHETERIZATION Left 04/16/2016   Procedure: Left Heart Cath and Coronary Angiography;  Surgeon: [name], MD;  Location: [facility];  Service: Cardiovascular;  Laterality: Left;  . CHOLECYSTECTOMY  2001  . COLONOSCOPY WITH PROPOFOL N/A 09/28/2018   Procedure: COLONOSCOPY WITH PROPOFOL;  Surgeon: Virgel Manifold, MD;  Location: [facility];  Service: Endoscopy;  Laterality: N/A;  . ESOPHAGOGASTRODUODENOSCOPY (EGD) WITH  PROPOFOL N/A 09/28/2018   Procedure: ESOPHAGOGASTRODUODENOSCOPY (EGD) WITH BIOPSY;  Surgeon: Virgel Manifold, MD;  Location: [facility];  Service: Endoscopy;  Laterality: N/A;  . GALLBLADDER SURGERY    . GASTRIC BYPASS  2001  . GASTROPLASTY      Current Medications: No outpatient medications have been marked as taking for the 05/10/19 encounter (Appointment) with Rise Mu, PA-C.    Allergies:   Penicillins and Bee venom   Social History   Socioeconomic History  . Marital status: Widowed    Spouse name: Not on file  . Number of children: 1  . Years of education: assoc degree  . Highest education level: Associate degree: occupational, Hotel manager, or vocational program  Occupational History  . Not on file  Tobacco Use  . Smoking status: Never Smoker  . Smokeless tobacco: Never Used  Substance and Sexual Activity  . Alcohol use: No    Alcohol/week: 0.0 standard drinks    Comment: holidays  . Drug use: No  . Sexual activity: Not Currently  Other Topics Concern  . 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 Difficulty of Paying Living Expenses: Not on file  Food Insecurity:   . Worried About Charity fundraiser in the Last Year: Not on file  . Ran Out of Food in the Last Year: Not on file  Transportation Needs:   . Lack of Transportation (Medical): Not on file  . Lack of Transportation (Non-Medical): Not on file  Physical Activity:   . Days of Exercise per Week: Not on file  . Minutes of Exercise per Session: Not on file  Stress:   . Feeling of Stress : Not on file  Social Connections:   . Frequency of Communication with Friends and Family: Not on file  .  Frequency of Social Gatherings with Friends and Family: Not on file  . Attends Religious Services: Not on file  . Active Member of Clubs or Organizations: Not on file  . Attends Archivist Meetings: Not on file  . Marital Status: Not on file     Family History:  The patient's family history includes Arthritis in her brother; Breast cancer in her maternal aunt; Cancer in her maternal grandmother, maternal uncle, maternal uncle, mother, and paternal grandmother; Depression in her brother; Diabetes in her brother and paternal grandmother;  Early death in her father and mother; Heart attack in her brother, maternal grandmother, and mother; Heart attack (age of onset: 5) in her father; Heart disease in her brother, father, and maternal grandmother.  ROS:   ROS   EKGs/Labs/Other Studies Reviewed:    Studies reviewed were summarized above. The additional studies were reviewed today:  2D Echo 07/2017: - Left ventricle: The cavity size was normal. There was moderate  focal basal hypertrophy. Systolic function was normal. The  estimated ejection fraction was in the range of 55% to 60%. Wall  motion was normal; there were no regional wall motion  abnormalities. Left ventricular diastolic function parameters  were normal.  - Right ventricle: The cavity size was mildly dilated. Wall  thickness was normal. Systolic function was normal.  - Right atrium: The atrium was mildly dilated.  - Inferior vena cava: The vessel was dilated. The respirophasic  diameter changes were blunted (< 50%), consistent with elevated  central venous pressure.  - Pericardium, extracardiac: A trivial pericardial effusion was  identified posterior to the heart.  __________  [facility] 03/2016: Coronary dominance: Right  Left mainstem:   Large vessel that bifurcates into the LAD and left circumflex, no significant disease noted  Left anterior descending (LAD):   Large vessel that extends to the  apical region, diagonal branch 2 of moderate size, non obstructive  disease noted, estimated at 40% proximal LAD disease, 30% mid vessel disease.  Left circumflex (LCx):  Large vessel with OM branch 2, no significant disease noted  Right coronary artery (RCA):  Right dominant vessel with PL and PDA, mid and distal vessel with 20% lesions  Left ventriculography: Left ventricular systolic function is normal, LVEF is estimated at 55-65%, there is no significant mitral regurgitation , no significant aortic valve stenosis  Final Conclusions:   Nonobstructive disease, midl to moderate in LAD, mild in RCA, consistent with previous CT chest findings False positive stress test, possibly secondary to breast attenuation artifact She does have small vessels, unable to exclude small vessel disease and spasm.  Recommendations:  For possible small vessel disease and spasm Would continue aggressive lipid management Consider adding low dose isosorbide mononitrate (could add dinitrate low dose as needed if unable to tolerate imdur) Could even consider ranexa if sx get worse   EKG:  EKG is ordered today.  The EKG ordered today demonstrates ***  Recent Labs: 09/01/2018: TSH 2.63 01/16/2019: ALT 15; BUN 9; Creatinine, Ser 0.92; Potassium 3.7; Sodium 141 04/26/2019: Hemoglobin 13.1; Platelets 215  Recent Lipid Panel    Component Value Date/Time   CHOL 176 09/01/2018 0935   CHOL 114 10/02/2016 1037   CHOL 147 05/04/2013 1556   TRIG 113.0 09/01/2018 0935   TRIG 79 05/04/2013 1556   HDL 57.90 09/01/2018 0935   HDL 55 10/02/2016 1037   HDL 58 05/04/2013 1556   CHOLHDL 3 09/01/2018 0935   VLDL 22.6 09/01/2018 0935   VLDL 16 05/04/2013 1556   LDLCALC 95 09/01/2018 0935   LDLCALC 47 10/02/2016 1037   LDLCALC 73 05/04/2013 1556    PHYSICAL EXAM:    VS:  LMP 02/18/2016 (Approximate) Comment: neg HCG-03/26/2016  BMI: There is no height or weight on file to calculate BMI.  Physical Exam  Wt Readings  from Last 3 Encounters:  04/26/19 223 lb (101.2 kg)  12/21/18 223 lb (101.2 kg)  11/25/18 229 lb 8 oz (104.1 kg)     ASSESSMENT & PLAN:   1. ***  Disposition: F/u with  Dr. Rockey Situ or an APP in ***.   Medication Adjustments/Labs and Tests Ordered: Current medicines are reviewed at length with the patient today.  Concerns regarding medicines are outlined above. Medication changes, Labs and Tests ordered today are summarized above and listed in the Patient Instructions accessible in Encounters.   Signed, Christell Faith, PA-C 05/08/2019 8:02 AM     [address] [place], [place] [address] [phone]

## 2019-05-10 ENCOUNTER — Ambulatory Visit: Payer: Medicare Other | Admitting: Physician Assistant

## 2019-05-11 ENCOUNTER — Ambulatory Visit: Payer: Medicare Other | Admitting: Licensed Clinical Social Worker

## 2019-05-11 ENCOUNTER — Other Ambulatory Visit: Payer: Self-pay

## 2019-05-16 ENCOUNTER — Encounter: Payer: Self-pay | Admitting: Psychiatry

## 2019-05-16 ENCOUNTER — Ambulatory Visit (INDEPENDENT_AMBULATORY_CARE_PROVIDER_SITE_OTHER): Payer: Medicare Other | Admitting: Psychiatry

## 2019-05-16 ENCOUNTER — Other Ambulatory Visit: Payer: Self-pay

## 2019-05-16 DIAGNOSIS — F331 Major depressive disorder, recurrent, moderate: Secondary | ICD-10-CM | POA: Diagnosis not present

## 2019-05-16 DIAGNOSIS — F5105 Insomnia due to other mental disorder: Secondary | ICD-10-CM | POA: Diagnosis not present

## 2019-05-16 DIAGNOSIS — F411 Generalized anxiety disorder: Secondary | ICD-10-CM

## 2019-05-16 DIAGNOSIS — Z9119 Patient's noncompliance with other medical treatment and regimen: Secondary | ICD-10-CM

## 2019-05-16 DIAGNOSIS — Z91199 Patient's noncompliance with other medical treatment and regimen due to unspecified reason: Secondary | ICD-10-CM

## 2019-05-16 DIAGNOSIS — F1590 Other stimulant use, unspecified, uncomplicated: Secondary | ICD-10-CM

## 2019-05-16 DIAGNOSIS — F4310 Post-traumatic stress disorder, unspecified: Secondary | ICD-10-CM | POA: Diagnosis not present

## 2019-05-16 MED ORDER — ARIPIPRAZOLE 2 MG PO TABS: 3.0000 mg | ORAL_TABLET | Freq: Every day | ORAL | 0 refills | Status: DC

## 2019-05-16 MED ORDER — BELSOMRA 10 MG PO TABS: 10.0000 mg | ORAL_TABLET | Freq: Every day | ORAL | 1 refills | Status: DC

## 2019-05-16 MED ORDER — BUSPIRONE HCL 15 MG PO TABS: 15.0000 mg | ORAL_TABLET | Freq: Two times a day (BID) | ORAL | 0 refills | Status: DC

## 2019-05-16 NOTE — Progress Notes (Signed)
Provider Location : [facility] Patient Location : Home  Virtual Visit via Telephone Note  I connected with Cheyenne Gray on 05/16/19 at 10:20 AM EST by telephone and verified that I am speaking with the correct person using two identifiers.   I discussed the limitations, risks, security and privacy concerns of performing an evaluation and management service by telephone and the availability of in person appointments. I also discussed with the patient that there may be a patient responsible charge related to this service. The patient expressed understanding and agreed to proceed.  I discussed the assessment and treatment plan with the patient. The patient was provided an opportunity to ask questions and all were answered. The patient agreed with the plan and demonstrated an understanding of the instructions.   The patient was advised to call back or seek an in-person evaluation if the symptoms worsen or if the condition fails to improve as anticipated.  [facility] MD OP Progress Note  05/16/2019 12:30 PM Cheyenne Gray  MRN:  [id number]  Chief Complaint:  Chief Complaint    Follow-up     HPI: Cheyenne Gray is a 54 year old Caucasian female, widowed, on disability, has a history of PTSD, MDD, GAD, insomnia, caffeine use disorder, history of prolonged QT, chronic back pain on methadone, vitamin B12 deficiency, heart failure, iron deficiency, vitamin D deficiency, history of gastric bypass, non-ST elevation, MI, hypertension was evaluated by phone today.  Patient preferred to do a phone call.  Patient today reports she is currently struggling with worsening anxiety symptoms and irritability.  She reports her dog is dying and that could be contributing to this.  Patient however reports she also has not been compliant with some of her medications like the BuSpar.  She reports however she is motivated to start taking it every day.  She reports sleep is good when she takes the [place].  She however does not take  it every night since she is afraid to take it when she is alone at home.  Patient denies any suicidality, homicidality, perceptual disturbances.  Patient reports she continues to follow-up with Ms. Alden Hipp her therapist and reports therapy sessions is going well. Visit Diagnosis:    ICD-10-CM   1. PTSD (post-traumatic stress disorder)  F43.10 ARIPiprazole (ABILIFY) 2 MG tablet    busPIRone (BUSPAR) 15 MG tablet    Suvorexant (BELSOMRA) 10 MG TABS  2. GAD (generalized anxiety disorder)  F41.1 busPIRone (BUSPAR) 15 MG tablet    Suvorexant (BELSOMRA) 10 MG TABS  3. MDD (major depressive disorder), recurrent episode, moderate (HCC)  F33.1 ARIPiprazole (ABILIFY) 2 MG tablet    Suvorexant (BELSOMRA) 10 MG TABS  4. Insomnia due to mental condition  F51.05 Suvorexant (BELSOMRA) 10 MG TABS  5. 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 Noncompliance with treatment regimen  Z91.19     Past Psychiatric History: I have reviewed past psychiatric history from my progress note on 11/06/2017.  Past trials of Paxil, Cymbalta, Valium, melatonin, trazodone, Ambien, Wellbutrin.  Past Medical History:  Past Medical History:  Diagnosis Date  . (HFpEF) heart failure with preserved ejection fraction ([place])    a. Echo 2014: EF 65-70%, nl WM, mildly dilated LA, PASP nl; b. 12/2014 Echo: EF 65-70%, no rwma, mod septal hypertrophy w/o LVOT gradient or SAM; c. 07/2017 Echo: EF 55-60%, no rwma, mildly dil RV w/ nl syst fxn. Mildly dil RA. Dilated IVC w/ elevated CVP. Triv post effusion.  Marland Kitchen Anxiety   . Arthritis   .  Asthma   . B12 deficiency   . Chronic back pain   . Chronic headaches   . Chronic pain    a. on methadone  . Concussion    hx of 4  . Coronary artery disease, non-occlusive    a. LHC 1/18: proximal to mid LAD 40% stenosed, mid LAD 30% stenosed, mid RCA 20% stenosed, distal RCA 20% stenosed, EF 55-65%, LVEDP normal  . Depression   . DJD (degenerative joint disease), multiple sites   . Frequent  headaches   . Gallstone   . H/O non-ST elevation myocardial infarction (NSTEMI)   . Hand, foot and mouth disease 2016  . History of shingles   . Hypertension   . Iron deficiency anemia   . Long QT interval   . Methadone use ([place])    managed by Dr. Primus Bravo  . Migraine    1x/mo  . Motion sickness    cars  . Obesity   . Palpitations    a. 24 hour Holter: NSR, sinus brady down to 48, occasional PVCs & couplets, 8 beats NSVT; b. 30 day event monitor 2015: NSR with rare PVC.  Marland Kitchen Psoriasis   . Syncope and collapse   . Vitamin D deficiency   . Wears dentures    full upper and lower    Past Surgical History:  Procedure Laterality Date  . ABDOMINOPLASTY     tummy tuck ? year   . [place] SURGERY  2001  . CARDIAC CATHETERIZATION Left 04/16/2016   Procedure: Left Heart Cath and Coronary Angiography;  Surgeon: [name], MD;  Location: [facility];  Service: Cardiovascular;  Laterality: Left;  . CHOLECYSTECTOMY  2001  . COLONOSCOPY WITH PROPOFOL N/A 09/28/2018   Procedure: COLONOSCOPY WITH PROPOFOL;  Surgeon: Virgel Manifold, MD;  Location: [facility];  Service: Endoscopy;  Laterality: N/A;  . ESOPHAGOGASTRODUODENOSCOPY (EGD) WITH PROPOFOL N/A 09/28/2018   Procedure: ESOPHAGOGASTRODUODENOSCOPY (EGD) WITH BIOPSY;  Surgeon: Virgel Manifold, MD;  Location: [facility];  Service: Endoscopy;  Laterality: N/A;  . GALLBLADDER SURGERY    . GASTRIC BYPASS  2001  . GASTROPLASTY      Family Psychiatric History: I have reviewed family psychiatric history from my progress note on 11/06/2017.  Family History:  Family History  Problem Relation Age of Onset  . Heart attack Mother   . Cancer Mother        pancreatitic   . Early death Mother   . Heart attack Father 78       MI  . Early death Father   . Heart disease Father   . Heart attack Brother   . Heart disease Brother   . Arthritis Brother   . Depression Brother   . Diabetes Brother   . Heart  attack Maternal Grandmother   . Cancer Maternal Grandmother        pancreatitic   . Heart disease Maternal Grandmother   . Cancer Maternal Uncle        pancreatitic   . Cancer Paternal Grandmother        ? type   . Diabetes Paternal Grandmother   . Cancer Maternal Uncle        pancreatitic   . Breast cancer Maternal Aunt     Social History: I have reviewed social history from my progress note on 11/06/2017. Social History   Socioeconomic History  . Marital status: Widowed    Spouse name: Not on file  . Number of  children: 1  . Years of education: assoc degree  . Highest education level: Associate degree: occupational, Hotel manager, or vocational program  Occupational History  . Not on file  Tobacco Use  . Smoking status: Never Smoker  . Smokeless tobacco: Never Used  Substance and Sexual Activity  . Alcohol use: No    Alcohol/week: 0.0 standard drinks    Comment: holidays  . Drug use: No  . Sexual activity: Not Currently  Other Topics Concern  . 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 Difficulty of Paying Living Expenses: Not on file  Food Insecurity:   . Worried About Charity fundraiser in the Last Year: Not on file  . Ran Out of Food in the Last Year: Not on file  Transportation Needs:   . Lack of Transportation (Medical): Not on file  . Lack of Transportation (Non-Medical): Not on file  Physical Activity:   . Days of Exercise per Week: Not on file  . Minutes of Exercise per Session: Not on file  Stress:   . Feeling of Stress : Not on file  Social Connections:   . Frequency of Communication with Friends and Family: Not on file  . Frequency of Social Gatherings  with Friends and Family: Not on file  . Attends Religious Services: Not on file  . Active Member of Clubs or Organizations: Not on file  . Attends Archivist Meetings: Not on file  . Marital Status: Not on file    Allergies:  Allergies  Allergen Reactions  . Penicillins Hives, Shortness Of Breath and Rash    Has patient had a PCN reaction causing immediate rash, facial/tongue/throat swelling, SOB or lightheadedness with hypotension: Yes Has patient had a PCN reaction causing severe rash involving mucus membranes or skin necrosis: No Has patient had a PCN reaction that required hospitalization No Has patient had a PCN reaction occurring within the last 10 years: No If all of the above answers are "NO", then may proceed with Cephalosporin use.   . Bee Venom     Metabolic Disorder Labs: Lab Results  Component Value Date   HGBA1C 5.3 09/01/2018   No results found for: PROLACTIN Lab Results  Component Value Date   CHOL 176 09/01/2018   TRIG 113.0 09/01/2018   HDL 57.90 09/01/2018   CHOLHDL 3 09/01/2018   VLDL 22.6 09/01/2018   LDLCALC 95 09/01/2018   LDLCALC 47 10/02/2016   Lab Results  Component Value Date   TSH 2.63 09/01/2018   TSH 3.037 07/26/2017    Therapeutic Level Labs: No results found for: LITHIUM No results found for: VALPROATE No components found for:  CBMZ  Current Medications: Current Outpatient Medications  Medication Sig Dispense Refill  . albuterol (PROVENTIL HFA;VENTOLIN HFA) 108 (90 BASE) MCG/ACT inhaler Inhale 2 puffs into the lungs as needed for wheezing.    . ARIPiprazole (ABILIFY) 2 MG tablet Take 1.5 tablets (3 mg total) by mouth daily. 135 tablet 0  . BLACK COHOSH PO Take by mouth daily.    . busPIRone (BUSPAR) 15 MG tablet Take 1  tablet (15 mg total) by mouth 2 (two) times daily. 180 tablet 0  . Cholecalciferol (VITAMIN D3) 125 MCG (5000 UT) TABS Take by mouth daily.    . diclofenac sodium (VOLTAREN) 1 % GEL Apply 4 g topically 4  (four) times daily. Prn knees and 2 grams qid prn hands arthritis 100 g 11  . diclofenac Sodium (VOLTAREN) 1 % GEL     . DULoxetine (CYMBALTA) 30 MG capsule TAKE 3 CAPSULES BY MOUTH EVERY DAY 225 capsule 2  . EPINEPHrine 0.3 mg/0.3 mL IJ SOAJ injection Inject 0.3 mLs (0.3 mg total) into the muscle as needed for anaphylaxis. 2 each 0  . famotidine (PEPCID) 20 MG tablet Take 1 tablet (20 mg total) by mouth 2 (two) times daily. 60 tablet 1  . furosemide (LASIX) 40 MG tablet     . gabapentin (NEURONTIN) 400 MG capsule Limit 2 tablets in the a.m. and midday and 3 tablets each evening    Please dispense a three-month supply 630 capsule 0  . isosorbide mononitrate (IMDUR) 60 MG 24 hr tablet Take 1 tablet (60 mg total) by mouth daily. 90 tablet 0  . methadone (DOLOPHINE) 10 MG tablet Limit 1-2 tablets by mouth 2-3 times per day if tolerated 180 tablet 0  . mupirocin ointment (BACTROBAN) 2 % Apply 1 application topically 3 (three) times daily. Hands open wounds 30 g 0  . nitroGLYCERIN (NITROSTAT) 0.4 MG SL tablet Place 1 tablet (0.4 mg total) under the tongue every 5 (five) minutes as needed for chest pain. 25 tablet 3  . nystatin cream (MYCOSTATIN) APPLY TO CONNER OF MOUTH 2 4 TIMES DAILY    . rOPINIRole (REQUIP) 0.25 MG tablet Take 2-3 tablets (0.5-0.75 mg total) by mouth at bedtime. 90 tablet 11  . rosuvastatin (CRESTOR) 10 MG tablet TAKE 1 TABLET BY MOUTH EVERY DAY 90 tablet 1  . SUMAtriptan (IMITREX) 100 MG tablet TAKE 1 TABLET BY MOUTH AS NEEDED FOR HEADACHE. MAY REPEAT IN 2 HRS IF NEEDED. MAX 200MG/DAY  5  . Suvorexant (BELSOMRA) 10 MG TABS Take 10 mg by mouth at bedtime. 30 tablet 1  . triamcinolone cream (KENALOG) 0.1 % Apply 1 application topically 2 (two) times daily. 80 g 0  . vitamin B-12 (CYANOCOBALAMIN) 500 MCG tablet Take by mouth.     No current facility-administered medications for this visit.     Musculoskeletal: Strength & Muscle Tone: UTA Gait & Station: Reports as WNL Patient  leans: N/A  Psychiatric Specialty Exam: Review of Systems  Psychiatric/Behavioral: Positive for sleep disturbance. The patient is nervous/anxious.   All other systems reviewed and are negative.   Last menstrual period 02/18/2016.There is no height or weight on file to calculate BMI.  General Appearance: UTA  Eye Contact:  UTA  Speech:  Clear and Coherent  Volume:  Normal  Mood:  Irritable  Affect:  UTA  Thought Process:  Goal Directed and Descriptions of Associations: Intact  Orientation:  Full (Time, Place, and Person)  Thought Content: Logical   Suicidal Thoughts:  No  Homicidal Thoughts:  No  Memory:  Immediate;   Fair Recent;   Fair Remote;   Fair  Judgement:  Fair  Insight:  Fair  Psychomotor Activity:  UTA  Concentration:  Concentration: Fair and Attention Span: Fair  Recall:  [employer] of Knowledge: Fair  Language: Fair  Akathisia:  No  Handed:  Right  AIMS (if indicated): UTA  Assets:  Communication Skills Desire for [place]  ADL's:  Intact  Cognition: WNL  Sleep:  Restless   Screenings: GAD-7     Office Visit from 09/21/2015 in [facility]  Total GAD-7 Score  4    Mini-Mental     Office Visit from 10/30/2017 in [facility]  Total Score (max 30 points )  27    PHQ2-9     Office Visit from 12/21/2018 in [facility] Office Visit from 08/18/2017 in [facility] Office Visit from 11/15/2015 in [facility] Office Visit from 10/18/2015 in [facility] Office Visit from 09/21/2015 in [facility]  PHQ-2 Total Score  0  0  0  0  0       Assessment and Plan: Leone is a 54 year old Caucasian female, widowed, has a history of PTSD, depression, anxiety, multiple medical problems including coronary artery disease, history  of STEMI, prolonged QT per history, gastric bypass, vitamin D and vitamin B12 deficiency, chronic pain on methadone was evaluated by phone today.  She is biologically predisposed given her multiple health problems, trauma, family history of mental health problems.  Patient with psychosocial stressors of the current pandemic, her dog currently having health issues.  Patient will continue to benefit from medication readjustment, and psychotherapy sessions.  Plan as noted below.  Plan PTSD-stable Cymbalta as prescribed BuSpar 15 mg p.o. 2 times daily   MDD-unstable Cymbalta 90 mg p.o. daily Increase Abilify to 3 mg p.o. daily Continue CBT  GAD-unstable Encourage compliance on BuSpar.  She has been noncompliant Continue CBT. Continue Cymbalta as prescribed  Insomnia-improving Belsomra 5-10 mg at bedtime. Patient encouraged to take it every night.  Caffeine use disorder-improving She is cutting back  Noncompliance with treatment plan-encourage compliance.  Provided education.  I have reviewed EKG in [facility] dated 01/17/2019-within normal limits.  Follow-up in clinic in 4 weeks or sooner if needed.  March 29 at 9 AM  I have spent atleast 20 minutes non face to face with patient today. More than 50 % of the time was spent for obtaining and to review and separately obtained history , ordering medications and test ,psychoeducation and supportive psychotherapy and care coordination,as well as documenting clinical information in electronic health record,interpreting results of test and communication of results This note was generated in part or whole with voice recognition software. Voice recognition is usually quite accurate but there are transcription errors that can and very often do occur. I apologize for any typographical errors that were not detected and corrected.         Ursula Alert, MD 05/16/2019, 12:30 PM

## 2019-05-24 ENCOUNTER — Encounter: Payer: Self-pay | Admitting: Internal Medicine

## 2019-05-25 ENCOUNTER — Encounter: Payer: Self-pay | Admitting: Licensed Clinical Social Worker

## 2019-05-25 ENCOUNTER — Ambulatory Visit (INDEPENDENT_AMBULATORY_CARE_PROVIDER_SITE_OTHER): Payer: Medicare Other | Admitting: Licensed Clinical Social Worker

## 2019-05-25 ENCOUNTER — Other Ambulatory Visit: Payer: Self-pay

## 2019-05-25 DIAGNOSIS — F4310 Post-traumatic stress disorder, unspecified: Secondary | ICD-10-CM | POA: Diagnosis not present

## 2019-05-25 DIAGNOSIS — F411 Generalized anxiety disorder: Secondary | ICD-10-CM | POA: Diagnosis not present

## 2019-05-25 NOTE — Progress Notes (Signed)
Virtual Visit via Video Note  I connected with Cheyenne Gray on 05/25/19 at 10:00 AM EST by a video enabled telemedicine application and verified that I am speaking with the correct person using two identifiers.   I discussed the limitations of evaluation and management by telemedicine and the availability of in person appointments. The patient expressed understanding and agreed to proceed.   I discussed the assessment and treatment plan with the patient. The patient was provided an opportunity to ask questions and all were answered. The patient agreed with the plan and demonstrated an understanding of the instructions.   The patient was advised to call back or seek an in-person evaluation if the symptoms worsen or if the condition fails to improve as anticipated.  I provided 30 minutes of non-face-to-face time during this encounter.   Heidi Dach, LCSW    THERAPIST PROGRESS NOTE  Session Time: 1000  Participation Level: Active  Behavioral Response: CasualAlertAnxious  Type of Therapy: Individual Therapy  Treatment Goals addressed: Coping  Interventions: Solution Focused and Strength-based  Summary: Cheyenne Gray is a 54 y.o. female who presents with continued symptoms related to her diagnosis. Cheyenne Gray reports doing well since our last session, but notes she is currently in extreme pain. 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 LCSW validated Cheyenne Gray's feelings and held space for her to discuss her thoughts and feelings around the situation. Cheyenne Gray stated she felt like sh would not be able to do anything in the future that she'd planned to do--such as go on trips, tubing, etc, due to back pain. LCSW encouraged Cheyenne Gray to recognize we don't know if that's true, and to challenge negative thoughts that work to increase her anxiety. Cheyenne Gray expressed understanding and agreement with this information. Cheyenne Gray went on to note her  daughter was with her currently, as she was home from school for a doctor's appointment. She reported she and her daughter are getting along better, and stated that has been a large weight off her shoulders. LCSW validated Akeria's feelings around this situation as well, and recognized how comforting it can feel to have our closest relationships repaired. Cheyenne Gray expressed understanding and agreement with this information as well.   Suicidal/Homicidal: No  Therapist Response: Cheyenne Gray continues to work towards her tx goals but has not yet reached them. We will continue to work on improving emotional regulation skills and distress tolerance moving forward.   Plan: Return again in 2 weeks.  Diagnosis: Axis I: Post Traumatic Stress Disorder    Axis II: No diagnosis    Heidi Dach, LCSW 05/25/2019

## 2019-05-26 ENCOUNTER — Telehealth: Payer: Self-pay | Admitting: *Deleted

## 2019-05-26 ENCOUNTER — Ambulatory Visit: Payer: Medicare Other | Attending: Internal Medicine

## 2019-05-26 ENCOUNTER — Other Ambulatory Visit: Payer: Self-pay | Admitting: Family Medicine

## 2019-05-26 NOTE — Telephone Encounter (Signed)
Pt had appt for COVID vaccine and went to the old location instead of [facility], new appt made and address given, verbalized understanding.
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
## 2019-05-27 ENCOUNTER — Ambulatory Visit (INDEPENDENT_AMBULATORY_CARE_PROVIDER_SITE_OTHER): Payer: Medicare Other | Admitting: Cardiovascular Disease

## 2019-05-27 ENCOUNTER — Other Ambulatory Visit: Payer: Self-pay

## 2019-05-27 ENCOUNTER — Encounter: Payer: Self-pay | Admitting: Cardiovascular Disease

## 2019-05-27 VITALS — BP 140/98 | HR 73 | Ht 65.0 in | Wt 256.0 lb

## 2019-05-27 DIAGNOSIS — R079 Chest pain, unspecified: Secondary | ICD-10-CM

## 2019-05-27 DIAGNOSIS — I5032 Chronic diastolic (congestive) heart failure: Secondary | ICD-10-CM | POA: Diagnosis not present

## 2019-05-27 DIAGNOSIS — I25118 Atherosclerotic heart disease of native coronary artery with other forms of angina pectoris: Secondary | ICD-10-CM

## 2019-05-27 DIAGNOSIS — I739 Peripheral vascular disease, unspecified: Secondary | ICD-10-CM | POA: Diagnosis not present

## 2019-05-27 DIAGNOSIS — E782 Mixed hyperlipidemia: Secondary | ICD-10-CM

## 2019-05-27 DIAGNOSIS — I10 Essential (primary) hypertension: Secondary | ICD-10-CM

## 2019-05-27 DIAGNOSIS — R002 Palpitations: Secondary | ICD-10-CM

## 2019-05-27 MED ORDER — FUROSEMIDE 20 MG PO TABS: 20.0000 mg | ORAL_TABLET | Freq: Every day | ORAL | 0 refills | Status: DC | PRN

## 2019-05-27 MED ORDER — ROSUVASTATIN CALCIUM 10 MG PO TABS: 10.0000 mg | ORAL_TABLET | Freq: Every day | ORAL | 3 refills | Status: DC

## 2019-05-27 NOTE — Patient Instructions (Signed)
Cut the soda/gatoraide Go with no carb drinks  Medication Instructions:  Please take lasix as needed for leg swelling Restart crestor  If you need a refill on your cardiac medications before your next appointment, please call your pharmacy.    Lab work: No new labs needed   If you have labs (blood work) drawn today and your tests are completely normal, you will receive your results only by: Marland Kitchen MyChart Message (if you have MyChart) OR . A paper copy in the mail If you have any lab test that is abnormal or we need to change your treatment, we will call you to review the results.   Testing/Procedures: No new testing needed   Follow-Up: At [facility], you and your health needs are our priority.  As part of our continuing mission to provide you with exceptional heart care, we have created designated Provider Care Teams.  These Care Teams include your primary Cardiologist (physician) and Advanced Practice Providers (APPs -  Physician Assistants and Nurse Practitioners) who all work together to provide you with the care you need, when you need it.  . You will need a follow up appointment in 12 months  . Providers on your designated Care Team:   . Nicolasa Ducking, NP . Eula Listen, PA-C . Marisue Ivan, PA-C  Any Other Special Instructions Will Be Listed Below (If Applicable).  For educational health videos Log in to : [email or web address] Or : [email or web address], password : [email or web address]

## 2019-05-30 ENCOUNTER — Ambulatory Visit: Payer: Medicare Other | Admitting: Oncology

## 2019-05-30 ENCOUNTER — Other Ambulatory Visit: Payer: Self-pay | Admitting: Internal Medicine

## 2019-05-30 ENCOUNTER — Encounter: Payer: Self-pay | Admitting: Internal Medicine

## 2019-05-30 ENCOUNTER — Other Ambulatory Visit: Payer: Self-pay | Admitting: *Deleted

## 2019-05-30 ENCOUNTER — Other Ambulatory Visit: Payer: Medicare Other

## 2019-05-30 DIAGNOSIS — N3001 Acute cystitis with hematuria: Secondary | ICD-10-CM

## 2019-05-30 DIAGNOSIS — K219 Gastro-esophageal reflux disease without esophagitis: Secondary | ICD-10-CM

## 2019-05-30 MED ORDER — FAMOTIDINE 20 MG PO TABS: 20.0000 mg | ORAL_TABLET | Freq: Two times a day (BID) | ORAL | 3 refills | Status: DC

## 2019-05-30 MED ORDER — ISOSORBIDE MONONITRATE ER 60 MG PO TB24: 60.0000 mg | ORAL_TABLET | Freq: Every day | ORAL | 2 refills | Status: DC

## 2019-05-30 NOTE — Addendum Note (Signed)
Addended by: Quentin Ore on: 05/30/2019 05:56 PM   Modules accepted: Orders

## 2019-05-30 NOTE — Telephone Encounter (Signed)
Requested Prescriptions   Signed Prescriptions Disp Refills  . isosorbide mononitrate (IMDUR) 60 MG 24 hr tablet 90 tablet 2    Sig: Take 1 tablet (60 mg total) by mouth daily.    Authorizing Provider: Creig Hines    Ordering User: Kendrick Fries

## 2019-05-31 ENCOUNTER — Other Ambulatory Visit: Payer: Self-pay | Admitting: Internal Medicine

## 2019-05-31 DIAGNOSIS — M1990 Unspecified osteoarthritis, unspecified site: Secondary | ICD-10-CM

## 2019-05-31 MED ORDER — DICLOFENAC SODIUM 1 % EX GEL: 2.0000 g | Freq: Four times a day (QID) | CUTANEOUS | 11 refills | Status: DC

## 2019-05-31 NOTE — Addendum Note (Signed)
Addended by: Quentin Ore on: 05/31/2019 07:49 AM   Modules accepted: Orders

## 2019-06-02 ENCOUNTER — Ambulatory Visit: Payer: Medicare Other

## 2019-06-07 ENCOUNTER — Encounter: Payer: Self-pay | Admitting: Internal Medicine

## 2019-06-07 MED ORDER — GABAPENTIN 400 MG PO CAPS: ORAL_CAPSULE | ORAL | 3 refills | Status: DC

## 2019-06-08 ENCOUNTER — Ambulatory Visit: Payer: Medicare Other | Admitting: Licensed Clinical Social Worker

## 2019-06-13 ENCOUNTER — Other Ambulatory Visit: Payer: Self-pay

## 2019-06-13 ENCOUNTER — Ambulatory Visit (INDEPENDENT_AMBULATORY_CARE_PROVIDER_SITE_OTHER): Payer: Medicare Other | Admitting: Psychiatry

## 2019-06-13 ENCOUNTER — Encounter: Payer: Self-pay | Admitting: Psychiatry

## 2019-06-13 DIAGNOSIS — F411 Generalized anxiety disorder: Secondary | ICD-10-CM | POA: Diagnosis not present

## 2019-06-13 DIAGNOSIS — F4310 Post-traumatic stress disorder, unspecified: Secondary | ICD-10-CM | POA: Diagnosis not present

## 2019-06-13 DIAGNOSIS — Z91199 Patient's noncompliance with other medical treatment and regimen due to unspecified reason: Secondary | ICD-10-CM

## 2019-06-13 DIAGNOSIS — I25118 Atherosclerotic heart disease of native coronary artery with other forms of angina pectoris: Secondary | ICD-10-CM | POA: Diagnosis not present

## 2019-06-13 DIAGNOSIS — F1590 Other stimulant use, unspecified, uncomplicated: Secondary | ICD-10-CM

## 2019-06-13 DIAGNOSIS — F5105 Insomnia due to other mental disorder: Secondary | ICD-10-CM

## 2019-06-13 DIAGNOSIS — F331 Major depressive disorder, recurrent, moderate: Secondary | ICD-10-CM

## 2019-06-13 DIAGNOSIS — Z9119 Patient's noncompliance with other medical treatment and regimen: Secondary | ICD-10-CM

## 2019-06-13 NOTE — Progress Notes (Signed)
Provider Location : [facility] Patient Location : Home  Virtual Visit via Telephone Note  I connected with Cheyenne Gray on 06/13/19 at  9:00 AM EDT by telephone and verified that I am speaking with the correct person using two identifiers.   I discussed the limitations, risks, security and privacy concerns of performing an evaluation and management service by telephone and the availability of in person appointments. I also discussed with the patient that there may be a patient responsible charge related to this service. The patient expressed understanding and agreed to proceed.    I discussed the assessment and treatment plan with the patient. The patient was provided an opportunity to ask questions and all were answered. The patient agreed with the plan and demonstrated an understanding of the instructions.   The patient was advised to call back or seek an in-person evaluation if the symptoms worsen or if the condition fails to improve as anticipated.  [place] MD OP Progress Note  06/13/2019 5:47 PM Cheyenne Gray  MRN:  [id number]  Chief Complaint:  Chief Complaint    Follow-up     HPI: Cheyenne Gray is a 54 year old Caucasian female, widowed, on disability, has a history of PTSD, MDD, GAD, insomnia, caffeine use disorder, history of prolonged QT, chronic back pain on methadone, vitamin B12 deficiency, heart failure, iron deficiency, vitamin D deficiency, history of gastric bypass, MI, hypertension was evaluated by phone today.  Patient preferred to do a phone call.  Patient today reports she is currently struggling with irritability and anxiety.  She reports the medications may be helping to some extent but she continues to feel irritable.  She wonders whether it is due to her perimenopausal symptoms.  She reports she is coping with the loss of her dog . She continues to be in psychotherapy sessions.  She continues to not sleep well at night.  She reports she does not feel safe in her neighborhood  and does not take the sleep medication daily which does make her sleep to be interrupted since she is not compliant.  Patient reports she continues to struggle with back pain and has upcoming appointment with her providers.  She may need back surgery.  Patient had recent visit with her cardiologist.  She reports everything looks okay except for having leg swelling.  She is limiting fluids and cutting back on caffeine.  Patient denies any suicidality, homicidality or perceptual disturbances.  Patient denies any other concerns today. Visit Diagnosis:    ICD-10-CM   1. PTSD (post-traumatic stress disorder)  F43.10   2. GAD (generalized anxiety disorder)  F41.1   3. MDD (major depressive disorder), recurrent episode, moderate (HCC)  F33.1   4. Insomnia due to mental condition  F51.05   5. 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 Noncompliance with treatment regimen  Z91.19     Past Psychiatric History: I have reviewed past psychiatric history from my progress note on 11/06/2017.  Past trials of Paxil, Cymbalta, Valium, melatonin, trazodone, Ambien, Wellbutrin.  Past Medical History:  Past Medical History:  Diagnosis Date  . (HFpEF) heart failure with preserved ejection fraction ([place])    a. Echo 2014: EF 65-70%, nl WM, mildly dilated LA, PASP nl; b. 12/2014 Echo: EF 65-70%, no rwma, mod septal hypertrophy w/o LVOT gradient or SAM; c. 07/2017 Echo: EF 55-60%, no rwma, mildly dil RV w/ nl syst fxn. Mildly dil RA. Dilated IVC w/ elevated CVP. Triv post effusion.  Marland Kitchen Anxiety   . Arthritis   .  Asthma   . B12 deficiency   . Chronic back pain   . Chronic headaches   . Chronic pain    a. on methadone  . Concussion    hx of 4  . Coronary artery disease, non-occlusive    a. LHC 1/18: proximal to mid LAD 40% stenosed, mid LAD 30% stenosed, mid RCA 20% stenosed, distal RCA 20% stenosed, EF 55-65%, LVEDP normal  . Depression   . DJD (degenerative joint disease), multiple sites   . Frequent headaches    . Gallstone   . H/O non-ST elevation myocardial infarction (NSTEMI)   . Hand, foot and mouth disease 2016  . History of shingles   . Hypertension   . Iron deficiency anemia   . Long QT interval   . Methadone use ([place])    managed by Dr. Primus Bravo  . Migraine    1x/mo  . Motion sickness    cars  . Obesity   . Palpitations    a. 24 hour Holter: NSR, sinus brady down to 48, occasional PVCs & couplets, 8 beats NSVT; b. 30 day event monitor 2015: NSR with rare PVC.  Marland Kitchen Psoriasis   . Syncope and collapse   . Vitamin D deficiency   . Wears dentures    full upper and lower    Past Surgical History:  Procedure Laterality Date  . ABDOMINOPLASTY     tummy tuck ? year   . [place] SURGERY  2001  . CARDIAC CATHETERIZATION Left 04/16/2016   Procedure: Left Heart Cath and Coronary Angiography;  Surgeon: [name], MD;  Location: [facility];  Service: Cardiovascular;  Laterality: Left;  . CHOLECYSTECTOMY  2001  . COLONOSCOPY WITH PROPOFOL N/A 09/28/2018   Procedure: COLONOSCOPY WITH PROPOFOL;  Surgeon: Virgel Manifold, MD;  Location: [facility] [facility];  Service: Endoscopy;  Laterality: N/A;  . ESOPHAGOGASTRODUODENOSCOPY (EGD) WITH PROPOFOL N/A 09/28/2018   Procedure: ESOPHAGOGASTRODUODENOSCOPY (EGD) WITH BIOPSY;  Surgeon: Virgel Manifold, MD;  Location: [facility];  Service: Endoscopy;  Laterality: N/A;  . GALLBLADDER SURGERY    . GASTRIC BYPASS  2001  . GASTROPLASTY      Family Psychiatric History: I have reviewed family psychiatric history from my progress note on 11/06/2017.  Family History:  Family History  Problem Relation Age of Onset  . Heart attack Mother   . Cancer Mother        pancreatitic   . Early death Mother   . Heart attack Father 59       MI  . Early death Father   . Heart disease Father   . Heart attack Brother   . Heart disease Brother   . Arthritis Brother   . Depression Brother   . Diabetes Brother   . Heart attack Maternal  Grandmother   . Cancer Maternal Grandmother        pancreatitic   . Heart disease Maternal Grandmother   . Cancer Maternal Uncle        pancreatitic   . Cancer Paternal Grandmother        ? type   . Diabetes Paternal Grandmother   . Cancer Maternal Uncle        pancreatitic   . Breast cancer Maternal Aunt     Social History: I have reviewed social history from my progress note on 11/06/2017. Social History   Socioeconomic History  . Marital status: Widowed    Spouse name: Not on file  . Number of  children: 1  . Years of education: assoc degree  . Highest education level: Associate degree: occupational, Hotel manager, or vocational program  Occupational History  . Not on file  Tobacco Use  . Smoking status: Never Smoker  . Smokeless tobacco: Never Used  Substance and Sexual Activity  . Alcohol use: No    Alcohol/week: 0.0 standard drinks    Comment: holidays  . Drug use: No  . Sexual activity: Not Currently  Other Topics Concern  . 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 Difficulty of Paying Living Expenses:   Food Insecurity:   . Worried About Charity fundraiser in the Last Year:   . Arboriculturist in the Last Year:   Transportation Needs:   . Film/video editor (Medical):   Marland Kitchen Lack of Transportation (Non-Medical):   Physical Activity:   . Days of Exercise per Week:   . Minutes of Exercise per Session:   Stress:   . Feeling of Stress :   Social Connections:   . Frequency of Communication with Friends and Family:   . Frequency of Social Gatherings with Friends and Family:   . Attends Religious Services:   . Active Member of Clubs or Organizations:   . Attends  Archivist Meetings:   Marland Kitchen Marital Status:     Allergies:  Allergies  Allergen Reactions  . Penicillins Hives, Shortness Of Breath and Rash    Has patient had a PCN reaction causing immediate rash, facial/tongue/throat swelling, SOB or lightheadedness with hypotension: Yes Has patient had a PCN reaction causing severe rash involving mucus membranes or skin necrosis: No Has patient had a PCN reaction that required hospitalization No Has patient had a PCN reaction occurring within the last 10 years: No If all of the above answers are "NO", then may proceed with Cephalosporin use.   . Bee Venom     Metabolic Disorder Labs: Lab Results  Component Value Date   HGBA1C 5.3 09/01/2018   No results found for: PROLACTIN Lab Results  Component Value Date   CHOL 176 09/01/2018   TRIG 113.0 09/01/2018   HDL 57.90 09/01/2018   CHOLHDL 3 09/01/2018   VLDL 22.6 09/01/2018   LDLCALC 95 09/01/2018   LDLCALC 47 10/02/2016   Lab Results  Component Value Date   TSH 2.63 09/01/2018   TSH 3.037 07/26/2017    Therapeutic Level Labs: No results found for: LITHIUM No results found for: VALPROATE No components found for:  CBMZ  Current Medications: Current Outpatient Medications  Medication Sig Dispense Refill  . albuterol (PROVENTIL HFA;VENTOLIN HFA) 108 (90 BASE) MCG/ACT inhaler Inhale 2 puffs into the lungs as needed for wheezing.    . ARIPiprazole (ABILIFY) 2 MG tablet Take 1.5 tablets (3 mg total) by mouth daily. 135 tablet 0  . BLACK COHOSH PO Take by mouth daily.    . busPIRone (BUSPAR) 15 MG tablet Take 1 tablet (15 mg total) by mouth 2 (two) times daily. 180 tablet 0  . Cholecalciferol (VITAMIN D3) 125 MCG (5000 UT) TABS Take by mouth daily.    Marland Kitchen  diclofenac sodium (VOLTAREN) 1 % GEL Apply 4 g topically 4 (four) times daily. Prn knees and 2 grams qid prn hands arthritis 100 g 11  . diclofenac Sodium (VOLTAREN) 1 % GEL Apply 2-4 g topically 4 (four) times daily. 2 grams upper  extremities and 4 grams qid prn lower extremities 150 g 11  . DULoxetine (CYMBALTA) 30 MG capsule TAKE 3 CAPSULES BY MOUTH EVERY DAY 225 capsule 2  . EPINEPHrine 0.3 mg/0.3 mL IJ SOAJ injection Inject 0.3 mLs (0.3 mg total) into the muscle as needed for anaphylaxis. 2 each 0  . famotidine (PEPCID) 20 MG tablet Take 1 tablet (20 mg total) by mouth 2 (two) times daily. 180 tablet 3  . furosemide (LASIX) 20 MG tablet Take 1 tablet (20 mg total) by mouth daily as needed. 90 tablet 0  . gabapentin (NEURONTIN) 400 MG capsule Limit 2 tablets in the a.m. and midday and 3 tablets each evening 630 capsule 3  . isosorbide mononitrate (IMDUR) 60 MG 24 hr tablet Take 1 tablet (60 mg total) by mouth daily. 90 tablet 2  . methadone (DOLOPHINE) 10 MG tablet Limit 1-2 tablets by mouth 2-3 times per day if tolerated 180 tablet 0  . mupirocin ointment (BACTROBAN) 2 % Apply 1 application topically 3 (three) times daily. Hands open wounds 30 g 0  . nitroGLYCERIN (NITROSTAT) 0.4 MG SL tablet Place 1 tablet (0.4 mg total) under the tongue every 5 (five) minutes as needed for chest pain. 25 tablet 3  . nystatin cream (MYCOSTATIN) APPLY TO CONNER OF MOUTH 2 4 TIMES DAILY    . rOPINIRole (REQUIP) 0.25 MG tablet Take 2-3 tablets (0.5-0.75 mg total) by mouth at bedtime. 90 tablet 11  . rosuvastatin (CRESTOR) 10 MG tablet Take 1 tablet (10 mg total) by mouth daily. 90 tablet 3  . SUMAtriptan (IMITREX) 100 MG tablet TAKE 1 TABLET BY MOUTH AS NEEDED FOR HEADACHE. MAY REPEAT IN 2 HRS IF NEEDED. MAX 200MG/DAY  5  . Suvorexant (BELSOMRA) 10 MG TABS Take 10 mg by mouth at bedtime. 30 tablet 1  . triamcinolone cream (KENALOG) 0.1 % Apply 1 application topically 2 (two) times daily. 80 g 0  . vitamin B-12 (CYANOCOBALAMIN) 500 MCG tablet Take by mouth.     No current facility-administered medications for this visit.     Musculoskeletal: Strength & Muscle Tone: UTA Gait & Station: Reports as OK Patient leans: N/A  Psychiatric  Specialty Exam: Review of Systems  Cardiovascular: Positive for leg swelling.  Musculoskeletal: Positive for back pain.  Psychiatric/Behavioral: Positive for sleep disturbance. The patient is nervous/anxious.   All other systems reviewed and are negative.   Last menstrual period 02/18/2016.There is no height or weight on file to calculate BMI.  General Appearance: UTA  Eye Contact:  UTA  Speech:  Clear and Coherent  Volume:  Normal  Mood:  Anxious and on and off irritability  Affect:  UTA  Thought Process:  Goal Directed and Descriptions of Associations: Intact  Orientation:  Full (Time, Place, and Person)  Thought Content: Logical   Suicidal Thoughts:  No  Homicidal Thoughts:  No  Memory:  Immediate;   Fair Recent;   Fair Remote;   Fair  Judgement:  Fair  Insight:  Fair  Psychomotor Activity:  UTA  Concentration:  Concentration: Fair and Attention Span: Fair  Recall:  [employer] of Knowledge: Fair  Language: Fair  Akathisia:  No  Handed:  Right  AIMS (if indicated): UTA  Assets:  Communication Skills Desire for Improvement Housing Social Support  ADL's:  Intact  Cognition: WNL  Sleep:  Restless   Screenings: GAD-7     Office Visit from 09/21/2015 in [facility]  Total GAD-7 Score  4    Mini-Mental     Office Visit from 10/30/2017 in [facility]  Total Score (max 30 points )  27    PHQ2-9     Office Visit from 12/21/2018 in [facility] Office Visit from 08/18/2017 in [facility] Office Visit from 11/15/2015 in [facility] Office Visit from 10/18/2015 in [facility] Office Visit from 09/21/2015 in [facility]  PHQ-2 Total Score  0  0  0  0  0       Assessment and Plan: Tanaia is a 54 year old Caucasian female, widowed, has a history of PTSD,  depression, anxiety, multiple medical problems including coronary artery disease, prolonged QT per history, gastric bypass, vitamin B12 deficiency, vitamin D deficiency, chronic pain on methadone was evaluated by phone today.  Patient is biologically predisposed given her multiple health problems, trauma, family history of mental health problems.  Patient with psychosocial stressors of current pandemic, her dog having health issues currently making some progress.  However she continues to have noncompliance with medications.  Plan as noted below.  Plan PTSD-stable Cymbalta as prescribed BuSpar 15 mg p.o. twice daily.  MDD-improving Cymbalta 90 mg p.o. daily Abilify 3 mg p.o. daily Continue CBT  GAD-improving BuSpar as prescribed Continue CBT.  Insomnia-restless Belsomra 5 to 10 mg at bedtime-however she has been noncompliant. Encouraged patient to work on her sleep hygiene and to be compliant on medications.  Caffeine use disorder-improving Patient's withdrawal from caffeine could also be contributing to her mood symptoms. She will monitor closely.  Noncompliance with treatment recommendation-encouraged compliance.  Follow-up in clinic in 4 weeks or sooner if needed.  I have spent atleast 20 minutes non face to face with patient today. More than 50 % of the time was spent for preparing to see the patient ( e.g., review of test, records ), obtaining and to review and separately obtained history , ordering medications and test ,psychoeducation and supportive psychotherapy and care coordination,as well as documenting clinical information in electronic health record. This note was generated in part or whole with voice recognition software. Voice recognition is usually quite accurate but there are transcription errors that can and very often do occur. I apologize for any typographical errors that were not detected and corrected.       Ursula Alert, MD 06/13/2019, 5:47 PM

## 2019-06-14 ENCOUNTER — Telehealth: Payer: Self-pay | Admitting: Internal Medicine

## 2019-06-14 NOTE — Telephone Encounter (Signed)
Left message for patient to call back and schedule Medicare Annual Wellness Visit (AWV) either virtually or audio only.  No hx of AWV; please schedule at anytime with Denisa O'Brien-Blaney at [facility]   

## 2019-06-17 ENCOUNTER — Ambulatory Visit: Payer: Medicare Other | Attending: Internal Medicine

## 2019-06-17 ENCOUNTER — Other Ambulatory Visit: Payer: Self-pay

## 2019-06-17 DIAGNOSIS — Z23 Encounter for immunization: Secondary | ICD-10-CM

## 2019-06-17 NOTE — Progress Notes (Signed)
   Covid-19 Vaccination Clinic  Name:  Cheyenne Gray    MRN: [id number] DOB: [birth year]  06/17/2019  Cheyenne Gray was observed post Covid-19 immunization for 30 minutes based on pre-vaccination screening without incident. She was provided with Vaccine Information Sheet and instruction to access the V-Safe system.   Cheyenne Gray was instructed to call 911 with any severe reactions post vaccine: Marland Kitchen Difficulty breathing  . Swelling of face and throat  . A fast heartbeat  . A bad rash all over body  . Dizziness and weakness   Immunizations Administered    Name Date Dose VIS Date Route   Pfizer COVID-19 Vaccine 06/17/2019 11:39 AM 0.3 mL 02/25/2019 Intramuscular   Manufacturer: [employer], [employer]   Lot: [id number]   NDC: [address]-3

## 2019-06-17 NOTE — Progress Notes (Signed)
   Covid-19 Vaccination Clinic  Name:  Cheyenne Gray    MRN: [id number] DOB: 14-Sep-1965  06/17/2019  Cheyenne Gray was observed post Covid-19 immunization for 15 minutes .  During the observation period, she experienced an adverse reaction with the following symptoms:  urticaria.  Assessment : Time of assessment Alert and oriented.No visible rash observed.  Actions taken:  Vitals sign taken  VAERS form obtained and completed by RN.  BP- 1203- 141/95, P- 83, O2- 97%, 1218- BP- 132/79, P- 82, O2-97%, , 1233= BP- 138/75, P- 75, O2- 100%.    Medications administered: Loratadine (Claritin) 10 mg  by mouth. Time: 1210. Administered by RN. Famotidine (Pepcid) 10 mg by mouth. Time: 1215. Administered by RN. Diphenhydramine (Benadryl) 25 mg capsule, by mouth. Time: 1220. Administered by RN.  Disposition: Reports no further symptoms of adverse reaction after observation for 30 minutes. Discharged home. The Patient was provided with Vaccine Information Sheet and instruction to access the V-Safe system.    Immunizations Administered    Name Date Dose VIS Date Route   Pfizer COVID-19 Vaccine 06/17/2019 11:39 AM 0.3 mL 02/25/2019 Intramuscular   Manufacturer: [employer], [employer]   Lot: [id number]   NDC: [address]-5

## 2019-06-21 ENCOUNTER — Encounter: Payer: Self-pay | Admitting: *Deleted

## 2019-06-21 ENCOUNTER — Telehealth: Payer: Self-pay | Admitting: *Deleted

## 2019-06-21 NOTE — Telephone Encounter (Signed)
Called and left a voicemail for patient asking to return call to discuss her COVID vaccine that she received on 06/17/2019. Asked for patient to return call to discuss any worsening symptoms or side effects and with any questions or concerns. MyChart message sent. Will follow up.

## 2019-06-24 ENCOUNTER — Other Ambulatory Visit: Payer: Self-pay | Admitting: Neurosurgery

## 2019-06-24 DIAGNOSIS — G8929 Other chronic pain: Secondary | ICD-10-CM

## 2019-07-01 NOTE — Telephone Encounter (Signed)
Letter has been mailed to the patient's home.  

## 2019-07-11 ENCOUNTER — Telehealth (INDEPENDENT_AMBULATORY_CARE_PROVIDER_SITE_OTHER): Payer: Medicare Other | Admitting: Psychiatry

## 2019-07-11 ENCOUNTER — Encounter: Payer: Self-pay | Admitting: Psychiatry

## 2019-07-11 ENCOUNTER — Other Ambulatory Visit: Payer: Self-pay

## 2019-07-11 DIAGNOSIS — F4310 Post-traumatic stress disorder, unspecified: Secondary | ICD-10-CM | POA: Diagnosis not present

## 2019-07-11 DIAGNOSIS — F5105 Insomnia due to other mental disorder: Secondary | ICD-10-CM

## 2019-07-11 DIAGNOSIS — F3341 Major depressive disorder, recurrent, in partial remission: Secondary | ICD-10-CM | POA: Diagnosis not present

## 2019-07-11 DIAGNOSIS — I25118 Atherosclerotic heart disease of native coronary artery with other forms of angina pectoris: Secondary | ICD-10-CM | POA: Diagnosis not present

## 2019-07-11 DIAGNOSIS — F411 Generalized anxiety disorder: Secondary | ICD-10-CM | POA: Diagnosis not present

## 2019-07-11 DIAGNOSIS — F1590 Other stimulant use, unspecified, uncomplicated: Secondary | ICD-10-CM

## 2019-07-11 NOTE — Progress Notes (Signed)
Provider Location : [facility] Patient Location : Home  Virtual Visit via Video Note  I connected with Cheyenne Gray on 07/11/19 at 10:00 AM EDT by a video enabled telemedicine application and verified that I am speaking with the correct person using two identifiers.   I discussed the limitations of evaluation and management by telemedicine and the availability of in person appointments. The patient expressed understanding and agreed to proceed.   I discussed the assessment and treatment plan with the patient. The patient was provided an opportunity to ask questions and all were answered. The patient agreed with the plan and demonstrated an understanding of the instructions.   The patient was advised to call back or seek an in-person evaluation if the symptoms worsen or if the condition fails to improve as anticipated.   [facility] MD OP Progress Note  07/11/2019 10:30 AM Cheyenne Gray  MRN:  [id number]  Chief Complaint:  Chief Complaint    Follow-up     HPI: Cheyenne Gray is a 54 year old Caucasian female, widowed on disability, has a history of PTSD, MDD, GAD, insomnia, caffeine use disorder, history of prolonged QT, chronic back pain on methadone, vitamin B12 deficiency, heart failure, iron deficiency, vitamin D deficiency, history of gastric bypass, MI, hypertension was evaluated by telemedicine today.  Patient today reports she is currently struggling with back pain.  She had appointment with neurosurgeon Dr. Cari Caraway recently.  She is currently in physical therapy.  She reports if it does not get better she may need surgery.  She reports her mood symptoms as improving.  She does continue to struggle with the fact that her dog has health issues. She is trying to get him the help that he needs. Otherwise she is able to manage her mood symptoms on the current medication regimen.  She denies side effects.  Patient reports sleep as restless on and off since her dog may need her help in the middle of  the night.  She continues to use the Belsomra only as needed.  Patient denies any suicidality, homicidality or perceptual disturbances.  Patient reports she has not had her psychotherapy appointment with her therapist in a long time however she agrees to schedule an appointment soon.  She is cutting back on caffeine use.  She reports she drinks only couple of cans of soft drinks per day now.That is a tremendous improvement for her.  Patient denies any other concerns today. Visit Diagnosis:    ICD-10-CM   1. PTSD (post-traumatic stress disorder)  F43.10   2. GAD (generalized anxiety disorder)  F41.1   3. MDD (major depressive disorder), recurrent, in partial remission ([place])  F33.41   4. Insomnia due to mental condition  F51.05   5. 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  Past trials of Paxil, Cymbalta, Valium, melatonin, trazodone, Ambien, Wellbutrin.  Past Medical History:  Past Medical History:  Diagnosis Date  . (HFpEF) heart failure with preserved ejection fraction ([place])    a. Echo 2014: EF 65-70%, nl WM, mildly dilated LA, PASP nl; b. 12/2014 Echo: EF 65-70%, no rwma, mod septal hypertrophy w/o LVOT gradient or SAM; c. 07/2017 Echo: EF 55-60%, no rwma, mildly dil RV w/ nl syst fxn. Mildly dil RA. Dilated IVC w/ elevated CVP. Triv post effusion.  Marland Kitchen Anxiety   . Arthritis   . Asthma   . B12 deficiency   . Chronic back  pain   . Chronic headaches   . Chronic pain    a. on methadone  . Concussion    hx of 4  . Coronary artery disease, non-occlusive    a. LHC 1/18: proximal to mid LAD 40% stenosed, mid LAD 30% stenosed, mid RCA 20% stenosed, distal RCA 20% stenosed, EF 55-65%, LVEDP normal  . Depression   . DJD (degenerative joint disease), multiple sites   . Frequent headaches   . Gallstone   . H/O non-ST elevation myocardial infarction (NSTEMI)   . Hand, foot and mouth disease  2016  . History of shingles   . Hypertension   . Iron deficiency anemia   . Long QT interval   . Methadone use ([place])    managed by Dr. Primus Bravo  . Migraine    1x/mo  . Motion sickness    cars  . Obesity   . Palpitations    a. 24 hour Holter: NSR, sinus brady down to 48, occasional PVCs & couplets, 8 beats NSVT; b. 30 day event monitor 2015: NSR with rare PVC.  Marland Kitchen Psoriasis   . Syncope and collapse   . Vitamin D deficiency   . Wears dentures    full upper and lower    Past Surgical History:  Procedure Laterality Date  . ABDOMINOPLASTY     tummy tuck ? year   . [place] SURGERY  2001  . CARDIAC CATHETERIZATION Left 04/16/2016   Procedure: Left Heart Cath and Coronary Angiography;  Surgeon: [name], MD;  Location: [facility];  Service: Cardiovascular;  Laterality: Left;  . CHOLECYSTECTOMY  2001  . COLONOSCOPY WITH PROPOFOL N/A 09/28/2018   Procedure: COLONOSCOPY WITH PROPOFOL;  Surgeon: Virgel Manifold, MD;  Location: [facility];  Service: Endoscopy;  Laterality: N/A;  . ESOPHAGOGASTRODUODENOSCOPY (EGD) WITH PROPOFOL N/A 09/28/2018   Procedure: ESOPHAGOGASTRODUODENOSCOPY (EGD) WITH BIOPSY;  Surgeon: Virgel Manifold, MD;  Location: [facility];  Service: Endoscopy;  Laterality: N/A;  . GALLBLADDER SURGERY    . GASTRIC BYPASS  2001  . GASTROPLASTY      Family Psychiatric History: I have reviewed family psychiatric history from my progress note on 11/06/2017.  Family History:  Family History  Problem Relation Age of Onset  . Heart attack Mother   . Cancer Mother        pancreatitic   . Early death Mother   . Heart attack Father 49       MI  . Early death Father   . Heart disease Father   . Heart attack Brother   . Heart disease Brother   . Arthritis Brother   . Depression Brother   . Diabetes Brother   . Heart attack Maternal Grandmother   . Cancer Maternal Grandmother        pancreatitic   . Heart disease Maternal Grandmother    . Cancer Maternal Uncle        pancreatitic   . Cancer Paternal Grandmother        ? type   . Diabetes Paternal Grandmother   . Cancer Maternal Uncle        pancreatitic   . Breast cancer Maternal Aunt     Social History: I have reviewed social history from my progress note on 11/06/2017. Social History   Socioeconomic History  . Marital status: Widowed    Spouse name: Not on file  . Number of children: 1  . Years of education: assoc degree  .  Highest education level: Associate degree: occupational, Hotel manager, or vocational program  Occupational History  . Not on file  Tobacco Use  . Smoking status: Never Smoker  . Smokeless tobacco: Never Used  Substance and Sexual Activity  . Alcohol use: No    Alcohol/week: 0.0 standard drinks    Comment: holidays  . Drug use: No  . Sexual activity: Not Currently  Other Topics Concern  . 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 Difficulty of Paying Living Expenses:   Food Insecurity:   . Worried About Charity fundraiser in the Last Year:   . Arboriculturist in the Last Year:   Transportation Needs:   . Film/video editor (Medical):   Marland Kitchen Lack of Transportation (Non-Medical):   Physical Activity:   . Days of Exercise per Week:   . Minutes of Exercise per Session:   Stress:   . Feeling of Stress :   Social Connections:   . Frequency of Communication with Friends and Family:   . Frequency of Social Gatherings with Friends and Family:   . Attends Religious Services:   . Active Member of Clubs or Organizations:   . Attends Archivist Meetings:   Marland Kitchen Marital Status:     Allergies:  Allergies  Allergen Reactions  .  Penicillins Hives, Shortness Of Breath and Rash    Has patient had a PCN reaction causing immediate rash, facial/tongue/throat swelling, SOB or lightheadedness with hypotension: Yes Has patient had a PCN reaction causing severe rash involving mucus membranes or skin necrosis: No Has patient had a PCN reaction that required hospitalization No Has patient had a PCN reaction occurring within the last 10 years: No If all of the above answers are "NO", then may proceed with Cephalosporin use.   . Bee Venom     Metabolic Disorder Labs: Lab Results  Component Value Date   HGBA1C 5.3 09/01/2018   No results found for: PROLACTIN Lab Results  Component Value Date   CHOL 176 09/01/2018   TRIG 113.0 09/01/2018   HDL 57.90 09/01/2018   CHOLHDL 3 09/01/2018   VLDL 22.6 09/01/2018   LDLCALC 95 09/01/2018   LDLCALC 47 10/02/2016   Lab Results  Component Value Date   TSH 2.63 09/01/2018   TSH 3.037 07/26/2017    Therapeutic Level Labs: No results found for: LITHIUM No results found for: VALPROATE No components found for:  CBMZ  Current Medications: Current Outpatient Medications  Medication Sig Dispense Refill  . folic acid (FOLVITE) 1 MG tablet Take by mouth.    Marland Kitchen albuterol (PROVENTIL HFA;VENTOLIN HFA) 108 (90 BASE) MCG/ACT inhaler Inhale 2 puffs into the lungs as needed for wheezing.    . ARIPiprazole (ABILIFY) 2 MG tablet Take 1.5 tablets (3 mg total) by mouth daily. 135 tablet 0  . BLACK COHOSH PO Take by mouth daily.    . busPIRone (BUSPAR) 15 MG tablet Take 1 tablet (15 mg total) by mouth 2 (two) times daily. 180 tablet 0  . Cholecalciferol (VITAMIN D3) 125 MCG (5000 UT) TABS Take by mouth  daily.    . diclofenac sodium (VOLTAREN) 1 % GEL Apply 4 g topically 4 (four) times daily. Prn knees and 2 grams qid prn hands arthritis 100 g 11  . diclofenac Sodium (VOLTAREN) 1 % GEL Apply 2-4 g topically 4 (four) times daily. 2 grams upper extremities and 4 grams qid prn lower extremities  150 g 11  . DULoxetine (CYMBALTA) 30 MG capsule TAKE 3 CAPSULES BY MOUTH EVERY DAY 225 capsule 2  . EPINEPHrine 0.3 mg/0.3 mL IJ SOAJ injection Inject 0.3 mLs (0.3 mg total) into the muscle as needed for anaphylaxis. 2 each 0  . famotidine (PEPCID) 20 MG tablet Take 1 tablet (20 mg total) by mouth 2 (two) times daily. 180 tablet 3  . furosemide (LASIX) 20 MG tablet Take 1 tablet (20 mg total) by mouth daily as needed. 90 tablet 0  . gabapentin (NEURONTIN) 400 MG capsule Limit 2 tablets in the a.m. and midday and 3 tablets each evening 630 capsule 3  . isosorbide mononitrate (IMDUR) 60 MG 24 hr tablet Take 1 tablet (60 mg total) by mouth daily. 90 tablet 2  . methadone (DOLOPHINE) 10 MG tablet Limit 1-2 tablets by mouth 2-3 times per day if tolerated 180 tablet 0  . mupirocin ointment (BACTROBAN) 2 % Apply 1 application topically 3 (three) times daily. Hands open wounds 30 g 0  . nitroGLYCERIN (NITROSTAT) 0.4 MG SL tablet Place 1 tablet (0.4 mg total) under the tongue every 5 (five) minutes as needed for chest pain. 25 tablet 3  . nystatin cream (MYCOSTATIN) APPLY TO CONNER OF MOUTH 2 4 TIMES DAILY    . rOPINIRole (REQUIP) 0.25 MG tablet Take 2-3 tablets (0.5-0.75 mg total) by mouth at bedtime. 90 tablet 11  . rosuvastatin (CRESTOR) 10 MG tablet Take 1 tablet (10 mg total) by mouth daily. 90 tablet 3  . SUMAtriptan (IMITREX) 100 MG tablet TAKE 1 TABLET BY MOUTH AS NEEDED FOR HEADACHE. MAY REPEAT IN 2 HRS IF NEEDED. MAX 200MG/DAY  5  . Suvorexant (BELSOMRA) 10 MG TABS Take 10 mg by mouth at bedtime. 30 tablet 1  . triamcinolone cream (KENALOG) 0.1 % Apply 1 application topically 2 (two) times daily. 80 g 0  . vitamin B-12 (CYANOCOBALAMIN) 500 MCG tablet Take by mouth.     No current facility-administered medications for this visit.     Musculoskeletal: Strength & Muscle Tone: UTA Gait & Station: normal Patient leans: N/A  Psychiatric Specialty Exam: Review of Systems  Musculoskeletal:  Positive for back pain.  Psychiatric/Behavioral: Positive for sleep disturbance.  All other systems reviewed and are negative.   Last menstrual period 02/18/2016.There is no height or weight on file to calculate BMI.  General Appearance: Casual  Eye Contact:  Fair  Speech:  Normal Rate  Volume:  Normal  Mood:  Euthymic  Affect:  Congruent  Thought Process:  Goal Directed and Descriptions of Associations: Intact  Orientation:  Full (Time, Place, and Person)  Thought Content: Logical   Suicidal Thoughts:  No  Homicidal Thoughts:  No  Memory:  Immediate;   Fair Recent;   Fair Remote;   Fair  Judgement:  Fair  Insight:  Fair  Psychomotor Activity:  Normal  Concentration:  Concentration: Fair and Attention Span: Fair  Recall:  [employer] of Knowledge: Fair  Language: Fair  Akathisia:  No  Handed:  Right  AIMS (if indicated): UTA  Assets:  Communication Skills Desire for Improvement Housing Social Support  ADL's:  Intact  Cognition: WNL  Sleep:  Restless   Screenings: GAD-7     Office Visit from 09/21/2015 in [facility]  Total GAD-7 Score  4    Mini-Mental     Office Visit from 10/30/2017 in [facility]  Total Score (max 30 points )  27    PHQ2-9     Office Visit from 12/21/2018 in [facility] Office Visit from 08/18/2017 in [facility] Office Visit from 11/15/2015 in [facility] Office Visit from 10/18/2015 in [facility] Office Visit from 09/21/2015 in [facility]  PHQ-2 Total Score  0  0  0  0  0       Assessment and Plan: Cheyenne Gray is a 54 year old Caucasian female, widowed, has a history of PTSD, depression, anxiety, multiple medical problems including coronary artery disease, prolonged QT per history, gastric bypass, vitamin B12 deficiency,  vitamin D deficiency, chronic pain on methadone was evaluated by telemedicine today.  Patient is biologically predisposed given her multiple health problems, trauma, family history of mental health problems.  Patient with psychosocial stressors of the pandemic, her dog having health issues and her own pain.  Patient continues to be noncompliant with sleep medications as well as psychotherapy sessions.  Patient however reports improvement in her mood and will continue to benefit from the following plan.  Plan PTSD-stable Cymbalta as prescribed BuSpar 15 mg p.o. twice daily  MDD-in partial remission Cymbalta 90 mg p.o. daily Abilify 3 mg p.o. daily Continue CBT  GAD-improving BuSpar as prescribed Continue CBT  Insomnia-restless She is noncompliant with sleep medications. Encouraged compliance. Belsomra 5 to 10 mg at bedtime as needed.  Caffeine use disorder-improving She continues to cut back on caffeine intake.  Provided counseling.  Encourage patient to schedule appointment with therapist.  Follow-up in clinic in 6 weeks or sooner if needed.  I have spent atleast 20 minutes non face to face with patient today. More than 50 % of the time was spent for preparing to see the patient ( e.g., review of test, records ),ordering medications and test ,psychoeducation and supportive psychotherapy and care coordination,as well as documenting clinical information in electronic health record. This note was generated in part or whole with voice recognition software. Voice recognition is usually quite accurate but there are transcription errors that can and very often do occur. I apologize for any typographical errors that were not detected and corrected.      Ursula Alert, MD 07/11/2019, 10:30 AM

## 2019-07-12 ENCOUNTER — Ambulatory Visit: Payer: Medicare Other

## 2019-07-12 NOTE — Telephone Encounter (Signed)
Yes all of those symptoms are quite concerning.  I would not advise her to get the second dose based on the symptoms with the first dose.   I would also advise her not to get the Moderna vaccine as well since it is also mRNA based without testing first.   If she would like to be fully vaccinated I would recommend she have covid vaccine testing done to determine if Johnson&Johnson or the mRNA based vaccine would be an option for her going forward.

## 2019-07-12 NOTE — Telephone Encounter (Signed)
Patient called back in regards to the letter than she received from me regarding the reaction that she had to her first Pfizer vaccine. Patient received her first vaccine on 06/17/2019 and began to experience chest tightness such as her asthma flaring and then began to feel like her throat was starting to close. She started to break out in hives too. She received Claritin, Pepcid, and Benadryl and her symptoms began to improve shortly after. She stated that she has a heart condition called Long TQ and she states that she feels like that acted up more after she received the vaccine. She stated not enough to have to take her Nitroglycerin medication but she could tell that the symptoms were more prevalent after her vaccine for a few days. Patient is supposed to get her second vaccine today. Advised to patient to please hold off on receiving her second vaccine until hearing back from the physician what may be the best course of action to keep her safe. Patient verbalized understanding. Please advise.

## 2019-07-12 NOTE — Telephone Encounter (Signed)
Called and spoke with the patient and advised about COVID Component Testing and what all was entailed. Did advised not to get her second vaccine until we can come up with some better recommendations. Patient verbalized understanding and seemed a little on the fence, she has been scheduled for a new patient appointment with Dr. Dellis Anes this Friday via tele-visit to discuss further.

## 2019-07-14 ENCOUNTER — Telehealth: Payer: Self-pay | Admitting: Internal Medicine

## 2019-07-14 NOTE — Telephone Encounter (Signed)
Received a fax from [employer] for a knee brace for this patient. Patient states that she did request this brace from this company.   Patient was to be seen 5/11 at 3 but states she has to get  Her kids at this time. Appointment changed to 07/26/19 at 1:00 pm in office. Patient verbalized understanding.  For placed on your desk

## 2019-07-14 NOTE — Telephone Encounter (Signed)
Noted - happy to talk to her!   Malachi Bonds, MD [facility]

## 2019-07-15 ENCOUNTER — Telehealth: Payer: Self-pay | Admitting: Internal Medicine

## 2019-07-15 ENCOUNTER — Ambulatory Visit (INDEPENDENT_AMBULATORY_CARE_PROVIDER_SITE_OTHER): Payer: Medicare Other | Admitting: Allergy & Immunology

## 2019-07-15 ENCOUNTER — Other Ambulatory Visit: Payer: Self-pay

## 2019-07-15 DIAGNOSIS — Z7189 Other specified counseling: Secondary | ICD-10-CM

## 2019-07-15 DIAGNOSIS — I25118 Atherosclerotic heart disease of native coronary artery with other forms of angina pectoris: Secondary | ICD-10-CM | POA: Diagnosis not present

## 2019-07-15 DIAGNOSIS — T7840XD Allergy, unspecified, subsequent encounter: Secondary | ICD-10-CM | POA: Diagnosis not present

## 2019-07-15 DIAGNOSIS — Z7185 Encounter for immunization safety counseling: Secondary | ICD-10-CM

## 2019-07-15 NOTE — Telephone Encounter (Signed)
Signed order to fax

## 2019-07-15 NOTE — Progress Notes (Signed)
RE: Cheyenne Gray MRN: [id number] DOB: Nov 23, 1965 Date of Telemedicine Visit: 07/15/2019  Referring provider: No ref. provider found Primary care provider: McLean-Scocuzza, Pasty Spillers, MD  Chief Complaint: COVID19 vaccine reaction  Telemedicine Follow Up Visit via Telephone: I connected with Cheyenne Gray for a follow up on 07/18/19 by telephone and verified that I am speaking with the correct person using two identifiers.   I discussed the limitations, risks, security and privacy concerns of performing an evaluation and management service by telephone and the availability of in person appointments. I also discussed with the patient that there may be a patient responsible charge related to this service. The patient expressed understanding and agreed to proceed.  Patient is at home.  Provider is at the office.  Visit start time: 1:00 PM Visit end time: 1:37 PM Insurance consent/check in by: [facility] [address] consent and medical assistant/nurse: Dr. Reece Agar  History of Present Illness:  She is a 54 y.o. female, who is being evaluated for a concerning reaction to the COVID19 vaccination. She wanted to talk to an allergist before getting her second vaccination. Review of the nursing note shows that she developed symptoms within minutes of her vaccination. She reports hives within the 15 minutes of mandatory observation time. Vitals were normal, but she reported feeling hot over her entire body.   In the observation room, she received loratadine 10mg , famotidine 10mg , and diphenhydramine. She was discharged home after 30 minutes in stable condition. In conjunction with these urticarial symptoms, she reports that she experienced chest tightness and throat closure. She did NOT receive epinephrine at the vaccination site. She has QT prolongation at baseline and felt that the vaccine made her condition act up more than it usually does. She also had intermittent urticaria for a period of several days after the  vaccination. She is feeling better now at this time and was scheduled to receive her second vaccination on April 27th (but obviously did not have it done).   Her history is notable for chronic back pain for which she receives steroid injections somewhat regularly. She was on a deck that collapsed in her 30s and she had back pain since that time and is on disability for this reason. She also has a history of some anxiety/depression, although she reports that this is under fairly good control on the Buspar and Abilify. She also is a single parent, as her husband died in his late 54s due to "fluid around the heart". She does NOT receive regular flu vaccinations, but thinks that she had all of her childhood vaccinations without a problem. She has had a couple of colonoscopies without a problem, including the bowel prep.   Otherwise, there have been no changes to her past medical history, surgical history, family history, or social history.  Assessment and Plan:  Cheyenne Gray is a 54 y.o. female with:  COVID19 vaccine reactions  Complicated past medical history, including chronic back pain, anxiety/depression, and long QT syndrome  Given her reported reactions, I feel that COVID vaccine component testing is certainly warranted. We are going to get her scheduled for this as soon as possible to provide some reassurance regarding her receiving her second dose. Reassuringly, she has had colonoscopies without a problem. However, the constellation of symptoms still warrants testing. We did explain the process and emphasized that this could take upwards of four hours. She is in agreement with the plan.    Diagnostics: None.  Medication List:  Current Outpatient Medications  Medication Sig  Dispense Refill  . albuterol (PROVENTIL HFA;VENTOLIN HFA) 108 (90 BASE) MCG/ACT inhaler Inhale 2 puffs into the lungs as needed for wheezing.    . ARIPiprazole (ABILIFY) 2 MG tablet Take 1.5 tablets (3 mg total) by mouth  daily. 135 tablet 0  . BLACK COHOSH PO Take by mouth daily.    . busPIRone (BUSPAR) 15 MG tablet Take 1 tablet (15 mg total) by mouth 2 (two) times daily. 180 tablet 0  . Cholecalciferol (VITAMIN D3) 125 MCG (5000 UT) TABS Take by mouth daily.    . diclofenac sodium (VOLTAREN) 1 % GEL Apply 4 g topically 4 (four) times daily. Prn knees and 2 grams qid prn hands arthritis 100 g 11  . diclofenac Sodium (VOLTAREN) 1 % GEL Apply 2-4 g topically 4 (four) times daily. 2 grams upper extremities and 4 grams qid prn lower extremities 150 g 11  . DULoxetine (CYMBALTA) 30 MG capsule TAKE 3 CAPSULES BY MOUTH EVERY DAY 225 capsule 2  . EPINEPHrine 0.3 mg/0.3 mL IJ SOAJ injection Inject 0.3 mLs (0.3 mg total) into the muscle as needed for anaphylaxis. 2 each 0  . famotidine (PEPCID) 20 MG tablet Take 1 tablet (20 mg total) by mouth 2 (two) times daily. 638 tablet 3  . folic acid (FOLVITE) 1 MG tablet Take by mouth.    . furosemide (LASIX) 20 MG tablet Take 1 tablet (20 mg total) by mouth daily as needed. 90 tablet 0  . gabapentin (NEURONTIN) 400 MG capsule Limit 2 tablets in the a.m. and midday and 3 tablets each evening 630 capsule 3  . isosorbide mononitrate (IMDUR) 60 MG 24 hr tablet Take 1 tablet (60 mg total) by mouth daily. 90 tablet 2  . methadone (DOLOPHINE) 10 MG tablet Limit 1-2 tablets by mouth 2-3 times per day if tolerated 180 tablet 0  . mupirocin ointment (BACTROBAN) 2 % Apply 1 application topically 3 (three) times daily. Hands open wounds 30 g 0  . nitroGLYCERIN (NITROSTAT) 0.4 MG SL tablet Place 1 tablet (0.4 mg total) under the tongue every 5 (five) minutes as needed for chest pain. 25 tablet 3  . nystatin cream (MYCOSTATIN) APPLY TO CONNER OF MOUTH 2 4 TIMES DAILY    . rOPINIRole (REQUIP) 0.25 MG tablet Take 2-3 tablets (0.5-0.75 mg total) by mouth at bedtime. 90 tablet 11  . rosuvastatin (CRESTOR) 10 MG tablet Take 1 tablet (10 mg total) by mouth daily. 90 tablet 3  . SUMAtriptan (IMITREX)  100 MG tablet TAKE 1 TABLET BY MOUTH AS NEEDED FOR HEADACHE. MAY REPEAT IN 2 HRS IF NEEDED. MAX 200MG /DAY  5  . Suvorexant (BELSOMRA) 10 MG TABS Take 10 mg by mouth at bedtime. 30 tablet 1  . triamcinolone cream (KENALOG) 0.1 % Apply 1 application topically 2 (two) times daily. 80 g 0  . vitamin B-12 (CYANOCOBALAMIN) 500 MCG tablet Take by mouth.     No current facility-administered medications for this visit.   Allergies: Allergies  Allergen Reactions  . Penicillins Hives, Shortness Of Breath and Rash    Has patient had a PCN reaction causing immediate rash, facial/tongue/throat swelling, SOB or lightheadedness with hypotension: Yes Has patient had a PCN reaction causing severe rash involving mucus membranes or skin necrosis: No Has patient had a PCN reaction that required hospitalization No Has patient had a PCN reaction occurring within the last 10 years: No If all of the above answers are "NO", then may proceed with Cephalosporin use.   Raelyn Ensign Venom  I reviewed her past medical history, social history, family history, and environmental history and no significant changes have been reported from previous visits.  Review of Systems  Constitutional: Negative for activity change and appetite change.  HENT: Negative for congestion, postnasal drip, rhinorrhea, sinus pressure and sore throat.   Eyes: Negative for pain, discharge, redness and itching.  Respiratory: Negative for shortness of breath, wheezing and stridor.   Gastrointestinal: Negative for diarrhea, nausea and vomiting.  Musculoskeletal: Negative for arthralgias, joint swelling and myalgias.  Skin: Negative for rash.  Allergic/Immunologic: Negative for environmental allergies and food allergies.    Objective:  Physical exam not obtained as encounter was done via telephone.   Previous notes and tests were reviewed.  I discussed the assessment and treatment plan with the patient. The patient was provided an opportunity to  ask questions and all were answered. The patient agreed with the plan and demonstrated an understanding of the instructions.   The patient was advised to call back or seek an in-person evaluation if the symptoms worsen or if the condition fails to improve as anticipated.  I provided 37 minutes of non-face-to-face time during this encounter.  It was my pleasure to participate in Cheyenne Gray's care today. Please feel free to contact me with any questions or concerns.   Sincerely,  Alfonse Spruce, MD

## 2019-07-15 NOTE — Telephone Encounter (Signed)
Faxed PA to [employer] for knee brace  Sent 07/15/19 Fax number [phone]

## 2019-07-15 NOTE — Telephone Encounter (Signed)
Left vm on Dr Kandace Blitz scheduler to call ofc regarding referral.

## 2019-07-17 ENCOUNTER — Encounter: Payer: Self-pay | Admitting: Family Medicine

## 2019-07-18 ENCOUNTER — Encounter: Payer: Self-pay | Admitting: Allergy & Immunology

## 2019-07-18 ENCOUNTER — Encounter: Payer: Medicare Other | Admitting: Family Medicine

## 2019-07-19 ENCOUNTER — Encounter: Payer: Self-pay | Admitting: Diagnostic Neuroimaging

## 2019-07-19 ENCOUNTER — Telehealth: Payer: Self-pay | Admitting: *Deleted

## 2019-07-19 ENCOUNTER — Institutional Professional Consult (permissible substitution): Payer: Self-pay | Admitting: Diagnostic Neuroimaging

## 2019-07-19 NOTE — Telephone Encounter (Signed)
Patient was no-show for appointment today 

## 2019-07-20 ENCOUNTER — Telehealth: Payer: Self-pay | Admitting: Internal Medicine

## 2019-07-20 NOTE — Telephone Encounter (Signed)
Rejection Reason - Patient was No Show" [facility] said 25 minutes ago

## 2019-07-20 NOTE — Telephone Encounter (Signed)
Noted  

## 2019-07-25 ENCOUNTER — Other Ambulatory Visit: Payer: Self-pay

## 2019-07-25 ENCOUNTER — Ambulatory Visit
Admission: RE | Admit: 2019-07-25 | Discharge: 2019-07-25 | Disposition: A | Discharge status: Home / Self Care | Payer: Medicare Other | Source: Ambulatory Visit | Attending: Neurosurgery | Admitting: Neurosurgery

## 2019-07-25 DIAGNOSIS — M5441 Lumbago with sciatica, right side: Secondary | ICD-10-CM | POA: Insufficient documentation

## 2019-07-25 DIAGNOSIS — M5442 Lumbago with sciatica, left side: Secondary | ICD-10-CM

## 2019-07-25 DIAGNOSIS — G8929 Other chronic pain: Secondary | ICD-10-CM | POA: Insufficient documentation

## 2019-07-26 ENCOUNTER — Encounter: Payer: Self-pay | Admitting: Internal Medicine

## 2019-07-26 ENCOUNTER — Other Ambulatory Visit: Payer: Self-pay

## 2019-07-26 ENCOUNTER — Ambulatory Visit: Payer: Medicare Other | Admitting: Internal Medicine

## 2019-07-26 ENCOUNTER — Telehealth: Payer: Self-pay | Admitting: Psychiatry

## 2019-07-26 ENCOUNTER — Ambulatory Visit (INDEPENDENT_AMBULATORY_CARE_PROVIDER_SITE_OTHER): Payer: Medicare Other | Admitting: Internal Medicine

## 2019-07-26 VITALS — BP 126/86 | HR 75 | Temp 97.2°F | Ht 65.0 in | Wt 254.0 lb

## 2019-07-26 DIAGNOSIS — M25562 Pain in left knee: Secondary | ICD-10-CM

## 2019-07-26 DIAGNOSIS — M25561 Pain in right knee: Secondary | ICD-10-CM | POA: Diagnosis not present

## 2019-07-26 DIAGNOSIS — R9389 Abnormal findings on diagnostic imaging of other specified body structures: Secondary | ICD-10-CM

## 2019-07-26 DIAGNOSIS — M7050 Other bursitis of knee, unspecified knee: Secondary | ICD-10-CM

## 2019-07-26 DIAGNOSIS — G43009 Migraine without aura, not intractable, without status migrainosus: Secondary | ICD-10-CM

## 2019-07-26 DIAGNOSIS — M79642 Pain in left hand: Secondary | ICD-10-CM

## 2019-07-26 DIAGNOSIS — F32A Depression, unspecified: Secondary | ICD-10-CM

## 2019-07-26 DIAGNOSIS — M5416 Radiculopathy, lumbar region: Secondary | ICD-10-CM

## 2019-07-26 DIAGNOSIS — M5126 Other intervertebral disc displacement, lumbar region: Secondary | ICD-10-CM | POA: Insufficient documentation

## 2019-07-26 DIAGNOSIS — I10 Essential (primary) hypertension: Secondary | ICD-10-CM | POA: Insufficient documentation

## 2019-07-26 DIAGNOSIS — J45909 Unspecified asthma, uncomplicated: Secondary | ICD-10-CM | POA: Insufficient documentation

## 2019-07-26 DIAGNOSIS — L304 Erythema intertrigo: Secondary | ICD-10-CM | POA: Diagnosis not present

## 2019-07-26 DIAGNOSIS — M2550 Pain in unspecified joint: Secondary | ICD-10-CM

## 2019-07-26 DIAGNOSIS — F419 Anxiety disorder, unspecified: Secondary | ICD-10-CM

## 2019-07-26 DIAGNOSIS — Z1329 Encounter for screening for other suspected endocrine disorder: Secondary | ICD-10-CM

## 2019-07-26 DIAGNOSIS — G8929 Other chronic pain: Secondary | ICD-10-CM | POA: Insufficient documentation

## 2019-07-26 DIAGNOSIS — M5440 Lumbago with sciatica, unspecified side: Secondary | ICD-10-CM

## 2019-07-26 DIAGNOSIS — E785 Hyperlipidemia, unspecified: Secondary | ICD-10-CM

## 2019-07-26 DIAGNOSIS — Z113 Encounter for screening for infections with a predominantly sexual mode of transmission: Secondary | ICD-10-CM

## 2019-07-26 DIAGNOSIS — E559 Vitamin D deficiency, unspecified: Secondary | ICD-10-CM

## 2019-07-26 DIAGNOSIS — F329 Major depressive disorder, single episode, unspecified: Secondary | ICD-10-CM

## 2019-07-26 DIAGNOSIS — M79641 Pain in right hand: Secondary | ICD-10-CM

## 2019-07-26 DIAGNOSIS — J4520 Mild intermittent asthma, uncomplicated: Secondary | ICD-10-CM

## 2019-07-26 DIAGNOSIS — Z6841 Body Mass Index (BMI) 40.0 and over, adult: Secondary | ICD-10-CM

## 2019-07-26 DIAGNOSIS — E538 Deficiency of other specified B group vitamins: Secondary | ICD-10-CM

## 2019-07-26 MED ORDER — AMLODIPINE BESYLATE 2.5 MG PO TABS: 2.5000 mg | ORAL_TABLET | Freq: Every day | ORAL | 3 refills | Status: DC

## 2019-07-26 MED ORDER — CLOTRIMAZOLE 1 % EX CREA: 1.0000 "application " | TOPICAL_CREAM | Freq: Two times a day (BID) | CUTANEOUS | 12 refills | Status: DC

## 2019-07-26 NOTE — Telephone Encounter (Signed)
-----   Message from Bevelyn Buckles, MD sent at 07/26/2019  2:01 PM EDT ----- 2. Anxiety depression worse due to 52 y.o dog died and was dog ex significant other who died got for her GAD 15 and PHQ 9 score 19 on cymbalta 30 not taking abilify 2 mg qd not taking buspar 15 mg bidShe wants to switch to another psych and not Dr. Elna Breslow and needs new therapist was seeing kelseyPossibly female Can you help coordinate?

## 2019-07-26 NOTE — Telephone Encounter (Signed)
Noted  TMS 

## 2019-07-26 NOTE — Patient Instructions (Addendum)
[facility]  [address] [place], [place] [address]  [phone]  [address], [address] [place] [address] [place], [place] [address]  [phone]  [phone] (Fax)  Appointment (MRI lumbar)    Gold bond talc free powder over the counter daily   [facility] ortho Dr. Posey Pronto  Plastic surgery  Call Neurosurgery for appt  Psychiatry if you want to switch this is ok  [facility] Neurology [phone] Mammogram call to schedule    Amlodipine Oral Tablets What is this medicine? AMLODIPINE (am LOE di peen) is a calcium channel blocker. It relaxes your blood vessels and decreases the amount of work the heart has to do. It treats high blood pressure and/or prevents chest pain (also called angina). This medicine may be used for other purposes; ask your health care provider or pharmacist if you have questions. COMMON BRAND NAME(S): Norvasc What should I tell my health care provider before I take this medicine? They need to know if you have any of these conditions:  heart disease  liver disease  an unusual or allergic reaction to amlodipine, other drugs, foods, dyes, or preservatives  pregnant or trying to get pregnant  breast-feeding How should I use this medicine? Take this drug by mouth. Take it as directed on the prescription label at the same time every day. You can take it with or without food. If it upsets your stomach, take it with food. Keep taking it unless your health care provider tells you to stop. Talk to your health care provider about the use of this drug in children. While it may be prescribed for children as young as 6 for selected conditions, precautions do apply. Overdosage: If you think you have taken too much of this medicine contact a poison control center or emergency room at once. NOTE: This medicine is only for you. Do not share this medicine with others. What if I miss a dose? If you miss a  dose, take it as soon as you can. If it is almost time for your next dose, take only that dose. Do not take double or extra doses. What may interact with this medicine? This medicine may interact with the following medications:  clarithromycin  cyclosporine  diltiazem  itraconazole  simvastatin  tacrolimus This list may not describe all possible interactions. Give your health care provider a list of all the medicines, herbs, non-prescription drugs, or dietary supplements you use. Also tell them if you smoke, drink alcohol, or use illegal drugs. Some items may interact with your medicine. What should I watch for while using this medicine? Visit your health care provider for regular checks on your progress. Check your blood pressure as directed. Ask your health care provider what your blood pressure should be. Also, find out when you should contact him or her. Do not treat yourself for coughs, colds, or pain while you are using this drug without asking your health care provider for advice. Some drugs may increase your blood pressure. You may get drowsy or dizzy. Do not drive, use machinery, or do anything that needs mental alertness until you know how this drug affects you. Do not stand up or sit up quickly, especially if you are an older patient. This reduces the risk of dizzy or fainting spells. What side effects may I notice from receiving this medicine? Side effects that you should report to your doctor or health care provider as soon as possible:  allergic reactions (skin  rash, itching or hives; swelling of the face, lips, or tongue)  heart attack (trouble breathing; pain or tightness in the chest, neck, back or arms; unusually weak or tired)  low blood pressure (dizziness; feeling faint or lightheaded, falls; unusually weak or tired) Side effects that usually do not require medical attention (report these to your doctor or health care provider if they continue or are  bothersome):  facial flushing  nausea  palpitations  stomach pain  sudden weight gain  swelling of the ankles, feet, hands This list may not describe all possible side effects. Call your doctor for medical advice about side effects. You may report side effects to FDA at 1-800-FDA-1088. Where should I keep my medicine? Keep out of the reach of children and pets. Store at room temperature between 59 and 86 degrees F (15 and 30 degrees C). Protect from light and moisture. Keep the container tightly closed. Throw away any unused drug after the expiration date. NOTE: This sheet is a summary. It may not cover all possible information. If you have questions about this medicine, talk to your doctor, pharmacist, or health care provider.  2020 Elsevier/Gold Standard (2018-12-07 19:39:45)  DASH Eating Plan DASH stands for "Dietary Approaches to Stop Hypertension." The DASH eating plan is a healthy eating plan that has been shown to reduce high blood pressure (hypertension). It may also reduce your risk for type 2 diabetes, heart disease, and stroke. The DASH eating plan may also help with weight loss. What are tips for following this plan?  General guidelines  Avoid eating more than 2,300 mg (milligrams) of salt (sodium) a day. If you have hypertension, you may need to reduce your sodium intake to 1,500 mg a day.  Limit alcohol intake to no more than 1 drink a day for nonpregnant women and 2 drinks a day for men. One drink equals 12 oz of beer, 5 oz of wine, or 1 oz of hard liquor.  Work with your health care provider to maintain a healthy body weight or to lose weight. Ask what an ideal weight is for you.  Get at least 30 minutes of exercise that causes your heart to beat faster (aerobic exercise) most days of the week. Activities may include walking, swimming, or biking.  Work with your health care provider or diet and nutrition specialist (dietitian) to adjust your eating plan to your  individual calorie needs. Reading food labels   Check food labels for the amount of sodium per serving. Choose foods with less than 5 percent of the Daily Value of sodium. Generally, foods with less than 300 mg of sodium per serving fit into this eating plan.  To find whole grains, look for the word "whole" as the first word in the ingredient list. Shopping  Buy products labeled as "low-sodium" or "no salt added."  Buy fresh foods. Avoid canned foods and premade or frozen meals. Cooking  Avoid adding salt when cooking. Use salt-free seasonings or herbs instead of table salt or sea salt. Check with your health care provider or pharmacist before using salt substitutes.  Do not fry foods. Cook foods using healthy methods such as baking, boiling, grilling, and broiling instead.  Cook with heart-healthy oils, such as olive, canola, soybean, or sunflower oil. Meal planning  Eat a balanced diet that includes: ? 5 or more servings of fruits and vegetables each day. At each meal, try to fill half of your plate with fruits and vegetables. ? Up to 6-8 servings of  whole grains each day. ? Less than 6 oz of lean meat, poultry, or fish each day. A 3-oz serving of meat is about the same size as a deck of cards. One egg equals 1 oz. ? 2 servings of low-fat dairy each day. ? A serving of nuts, seeds, or beans 5 times each week. ? Heart-healthy fats. Healthy fats called Omega-3 fatty acids are found in foods such as flaxseeds and coldwater fish, like sardines, salmon, and mackerel.  Limit how much you eat of the following: ? Canned or prepackaged foods. ? Food that is high in trans fat, such as fried foods. ? Food that is high in saturated fat, such as fatty meat. ? Sweets, desserts, sugary drinks, and other foods with added sugar. ? Full-fat dairy products.  Do not salt foods before eating.  Try to eat at least 2 vegetarian meals each week.  Eat more home-cooked food and less restaurant,  buffet, and fast food.  When eating at a restaurant, ask that your food be prepared with less salt or no salt, if possible. What foods are recommended? The items listed may not be a complete list. Talk with your dietitian about what dietary choices are best for you. Grains Whole-grain or whole-wheat bread. Whole-grain or whole-wheat pasta. Brown rice. Orpah Cobb. Bulgur. Whole-grain and low-sodium cereals. Pita bread. Low-fat, low-sodium crackers. Whole-wheat flour tortillas. Vegetables Fresh or frozen vegetables (raw, steamed, roasted, or grilled). Low-sodium or reduced-sodium tomato and vegetable juice. Low-sodium or reduced-sodium tomato sauce and tomato paste. Low-sodium or reduced-sodium canned vegetables. Fruits All fresh, dried, or frozen fruit. Canned fruit in natural juice (without added sugar). Meat and other protein foods Skinless chicken or [place]. Ground chicken or [place]. Pork with fat trimmed off. Fish and seafood. Egg whites. Dried beans, peas, or lentils. Unsalted nuts, nut butters, and seeds. Unsalted canned beans. Lean cuts of beef with fat trimmed off. Low-sodium, lean deli meat. Dairy Low-fat (1%) or fat-free (skim) milk. Fat-free, low-fat, or reduced-fat cheeses. Nonfat, low-sodium ricotta or cottage cheese. Low-fat or nonfat yogurt. Low-fat, low-sodium cheese. Fats and oils Soft margarine without trans fats. Vegetable oil. Low-fat, reduced-fat, or light mayonnaise and salad dressings (reduced-sodium). Canola, safflower, olive, soybean, and sunflower oils. Avocado. Seasoning and other foods Herbs. Spices. Seasoning mixes without salt. Unsalted popcorn and pretzels. Fat-free sweets. What foods are not recommended? The items listed may not be a complete list. Talk with your dietitian about what dietary choices are best for you. Grains Baked goods made with fat, such as croissants, muffins, or some breads. Dry pasta or rice meal packs. Vegetables Creamed or fried  vegetables. Vegetables in a cheese sauce. Regular canned vegetables (not low-sodium or reduced-sodium). Regular canned tomato sauce and paste (not low-sodium or reduced-sodium). Regular tomato and vegetable juice (not low-sodium or reduced-sodium). Rosita Fire. Olives. Fruits Canned fruit in a light or heavy syrup. Fried fruit. Fruit in cream or butter sauce. Meat and other protein foods Fatty cuts of meat. Ribs. Fried meat. Tomasa Blase. Sausage. Bologna and other processed lunch meats. Salami. Fatback. Hotdogs. Bratwurst. Salted nuts and seeds. Canned beans with added salt. Canned or smoked fish. Whole eggs or egg yolks. Chicken or [place] with skin. Dairy Whole or 2% milk, cream, and half-and-half. Whole or full-fat cream cheese. Whole-fat or sweetened yogurt. Full-fat cheese. Nondairy creamers. Whipped toppings. Processed cheese and cheese spreads. Fats and oils Butter. Stick margarine. Lard. Shortening. Ghee. Bacon fat. Tropical oils, such as coconut, palm kernel, or palm oil. Seasoning and other foods Salted  popcorn and pretzels. Onion salt, garlic salt, seasoned salt, table salt, and sea salt. Worcestershire sauce. Tartar sauce. Barbecue sauce. Teriyaki sauce. Soy sauce, including reduced-sodium. Steak sauce. Canned and packaged gravies. Fish sauce. Oyster sauce. Cocktail sauce. Horseradish that you find on the shelf. Ketchup. Mustard. Meat flavorings and tenderizers. Bouillon cubes. Hot sauce and Tabasco sauce. Premade or packaged marinades. Premade or packaged taco seasonings. Relishes. Regular salad dressings. Where to find more information:  National Heart, Lung, and Blood Institute: PopSteam.is  American Heart Association: www.heart.org Summary  The DASH eating plan is a healthy eating plan that has been shown to reduce high blood pressure (hypertension). It may also reduce your risk for type 2 diabetes, heart disease, and stroke.  With the DASH eating plan, you should limit salt (sodium)  intake to 2,300 mg a day. If you have hypertension, you may need to reduce your sodium intake to 1,500 mg a day.  When on the DASH eating plan, aim to eat more fresh fruits and vegetables, whole grains, lean proteins, low-fat dairy, and heart-healthy fats.  Work with your health care provider or diet and nutrition specialist (dietitian) to adjust your eating plan to your individual calorie needs. This information is not intended to replace advice given to you by your health care provider. Make sure you discuss any questions you have with your health care provider. Document Revised: 02/13/2017 Document Reviewed: 02/25/2016 Elsevier Patient Education  2020 [employer].

## 2019-07-26 NOTE — Progress Notes (Signed)
Chief Complaint  Patient presents with  . Follow-up  . Medication Problem    Pt had a reaction to the pfizer covid vaccination. itching, throat closing, and increased heart rate.    F/u with daughter [name]   1. Chronic knee pain b/l worse with walking 8/10 northing tried agreeable to ortho referral has volataren gel to use  2. Anxiety depression worse due to 52 y.o dog died and was dog ex significant other who died got for her  GAD 15 and PHQ 9 score 19 on cymbalta 30 not taking abilify 2 mg qd not taking buspar 15 mg bid She wants to switch to another psych and not Dr. Shea Evans and needs new therapist was seeing kelsey  3. H/o migraines 1-2 weeks ago had right sided h/a not taking imirex 100 mg prn did not f/u with [facility] will call and schedule appt  4. Chronic low back pain with abnormal MRI 07/24/09 with spinal stenosis severe, bulging discs and arthritis needs f/u appt with Dr. Daun Peacock she has tried back injections but not completely effective will call for appt  She has pending [facility]   5. HTN BP elevted today 140/82 repeat 126/86 not on meds  6. Intertrigo and excess skin did not hear from plastic surgery referral she wants surgery to remove excess skin  7. covid vx pfizer 1/2 had reaction with itching, throat closing, increased HR and wheezing had to try 3 allergy pills to help and did not do epi pen 8. Multiple joint pain in knees, low back hands and joints 8/10 see above back pain #4 f/u with [facility] rec will work up for rheumatologic disc with pt today. She has h/o multiple falls with multiple injuries in the past   Review of Systems  Constitutional: Negative for weight loss.  HENT: Negative for hearing loss.   Eyes: Negative for blurred vision.  Respiratory: Negative for shortness of breath.   Cardiovascular: Negative for chest pain.  Gastrointestinal: Negative for abdominal pain.  Musculoskeletal: Positive for back pain and joint pain.  Skin: Positive for itching  and rash.  Neurological: Positive for headaches.  Psychiatric/Behavioral: Positive for depression. Negative for memory loss. The patient is nervous/anxious.    Past Medical History:  Diagnosis Date  . (HFpEF) heart failure with preserved ejection fraction ([place])    a. Echo 2014: EF 65-70%, nl WM, mildly dilated LA, PASP nl; b. 12/2014 Echo: EF 65-70%, no rwma, mod septal hypertrophy w/o LVOT gradient or SAM; c. 07/2017 Echo: EF 55-60%, no rwma, mildly dil RV w/ nl syst fxn. Mildly dil RA. Dilated IVC w/ elevated CVP. Triv post effusion.  Marland Kitchen Anxiety   . Arthritis   . Asthma   . B12 deficiency   . Chronic back pain   . Chronic headaches   . Chronic pain    a. on methadone  . Concussion    hx of 4  . Coronary artery disease, non-occlusive    a. LHC 1/18: proximal to mid LAD 40% stenosed, mid LAD 30% stenosed, mid RCA 20% stenosed, distal RCA 20% stenosed, EF 55-65%, LVEDP normal  . Depression   . DJD (degenerative joint disease), multiple sites   . Frequent headaches   . Gallstone   . H/O non-ST elevation myocardial infarction (NSTEMI)   . Hand, foot and mouth disease 2016  . History of shingles   . Hypertension   . Iron deficiency anemia   . Long QT interval   . Methadone use (  [place])    managed by Dr. Primus Bravo  . Migraine    1x/mo  . Motion sickness    cars  . Obesity   . Palpitations    a. 24 hour Holter: NSR, sinus brady down to 48, occasional PVCs & couplets, 8 beats NSVT; b. 30 day event monitor 2015: NSR with rare PVC.  Marland Kitchen Psoriasis   . Syncope and collapse   . Vitamin D deficiency   . Wears dentures    full upper and lower   Past Surgical History:  Procedure Laterality Date  . ABDOMINOPLASTY     tummy tuck ? year   . [place] SURGERY  2001  . CARDIAC CATHETERIZATION Left 04/16/2016   Procedure: Left Heart Cath and Coronary Angiography;  Surgeon: [name], MD;  Location: [facility];  Service: Cardiovascular;  Laterality: Left;  . CHOLECYSTECTOMY  2001   . COLONOSCOPY WITH PROPOFOL N/A 09/28/2018   Procedure: COLONOSCOPY WITH PROPOFOL;  Surgeon: Virgel Manifold, MD;  Location: [facility];  Service: Endoscopy;  Laterality: N/A;  . ESOPHAGOGASTRODUODENOSCOPY (EGD) WITH PROPOFOL N/A 09/28/2018   Procedure: ESOPHAGOGASTRODUODENOSCOPY (EGD) WITH BIOPSY;  Surgeon: Virgel Manifold, MD;  Location: [facility];  Service: Endoscopy;  Laterality: N/A;  . GALLBLADDER SURGERY    . GASTRIC BYPASS  2001  . GASTROPLASTY     Family History  Problem Relation Age of Onset  . Heart attack Mother   . Cancer Mother        pancreatitic   . Early death Mother   . Heart attack Father 26       MI  . Early death Father   . Heart disease Father   . Heart attack Brother   . Heart disease Brother   . Arthritis Brother   . Depression Brother   . Diabetes Brother   . Heart attack Maternal Grandmother   . Cancer Maternal Grandmother        pancreatitic   . Heart disease Maternal Grandmother   . Cancer Maternal Uncle        pancreatitic   . Cancer Paternal Grandmother        ? type   . Diabetes Paternal Grandmother   . Cancer Maternal Uncle        pancreatitic   . Breast cancer Maternal Aunt    Social History   Socioeconomic History  . Marital status: Widowed    Spouse name: Not on file  . Number of children: 1  . Years of education: assoc degree  . Highest education level: Associate degree: occupational, Hotel manager, or vocational program  Occupational History  . Not on file  Tobacco Use  . Smoking status: Never Smoker  . Smokeless tobacco: Never Used  Substance and Sexual Activity  . Alcohol use: No    Alcohol/week: 0.0 standard drinks    Comment: holidays  . Drug use: No  . Sexual activity: Not Currently  Other Topics Concern  . 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 Difficulty of Paying Living Expenses:   Food Insecurity:   .  Worried About Charity fundraiser in the Last Year:   . Arboriculturist in the Last Year:   Transportation Needs:   . Film/video editor (Medical):   Marland Kitchen Lack of Transportation (Non-Medical):   Physical Activity:   . Days of Exercise per Week:   . Minutes of Exercise per Session:   Stress:   . Feeling of Stress :   Social Connections:   . Frequency of Communication with Friends and Family:   . Frequency of Social Gatherings with Friends and Family:   . Attends Religious Services:   . Active Member of Clubs or Organizations:   . Attends Archivist Meetings:   Marland Kitchen Marital Status:   Intimate Partner Violence:   . Fear of Current or Ex-Partner:   . Emotionally Abused:   Marland Kitchen Physically Abused:   . Sexually Abused:    Current Meds  Medication Sig  . albuterol (PROVENTIL HFA;VENTOLIN HFA) 108 (90 BASE) MCG/ACT inhaler Inhale 2 puffs into the lungs as needed for wheezing.  . Cholecalciferol (VITAMIN D3) 125 MCG (5000 UT) TABS Take by mouth daily.  . diclofenac sodium (VOLTAREN) 1 % GEL Apply 4 g topically 4 (four) times daily. Prn knees and 2 grams qid prn hands arthritis  . diclofenac Sodium (VOLTAREN) 1 % GEL Apply 2-4 g topically 4 (four) times daily. 2 grams upper extremities and 4 grams qid prn lower extremities  . DULoxetine (CYMBALTA) 30 MG capsule TAKE 3 CAPSULES BY MOUTH EVERY DAY  . EPINEPHrine 0.3 mg/0.3 mL IJ SOAJ injection Inject 0.3 mLs (0.3 mg total) into the muscle as needed for anaphylaxis.  Marland Kitchen gabapentin (NEURONTIN) 400 MG capsule Limit 2 tablets in the a.m. and midday and 3 tablets each evening  . isosorbide mononitrate (IMDUR) 60 MG 24 hr tablet Take 1 tablet (60 mg total) by mouth daily.  . methadone (DOLOPHINE) 10 MG tablet Limit 1-2 tablets by  mouth 2-3 times per day if tolerated  . mupirocin ointment (BACTROBAN) 2 % Apply 1 application topically 3 (three) times daily. Hands open wounds  . rOPINIRole (REQUIP) 0.25 MG tablet Take 2-3 tablets (0.5-0.75 mg total) by mouth at bedtime.  . rosuvastatin (CRESTOR) 10 MG tablet Take 1 tablet (10 mg total) by mouth daily.  . Suvorexant (BELSOMRA) 10 MG TABS Take 10 mg by mouth at bedtime.  . triamcinolone cream (KENALOG) 0.1 % Apply 1 application topically 2 (two) times daily.  . vitamin B-12 (CYANOCOBALAMIN) 500 MCG tablet Take by mouth.  . [DISCONTINUED] BLACK COHOSH PO Take by mouth daily.  . [DISCONTINUED] nystatin cream (MYCOSTATIN) APPLY TO CONNER OF MOUTH 2 4 TIMES DAILY   Allergies  Allergen Reactions  . Covid-19 ([facility]) Vaccine Therapist, music) [Covid-19 ([facility]) Vaccine] Itching, Palpitations and Other (See Comments)    Throat closing.   Marland Kitchen Penicillins Hives, Shortness Of Breath and Rash    Has patient had a PCN reaction causing immediate rash, facial/tongue/throat swelling, SOB or lightheadedness with hypotension: Yes Has patient had a PCN reaction causing severe rash involving mucus membranes or skin necrosis: No Has patient had a PCN reaction that required hospitalization No Has patient had a PCN reaction occurring within the last 10 years: No If all of the above answers are "NO", then may proceed with Cephalosporin use.   . Bee Venom    No results found for this or any previous visit (from the past 2160 hour(s)). Objective  Body mass index is 42.27 kg/m. Wt Readings from Last 3 Encounters:  07/26/19 254 lb (115.2 kg)  05/27/19 256 lb (116.1 kg)  04/26/19 223 lb (101.2 kg)   Temp Readings from Last 3 Encounters:  07/26/19 (!) 97.2 F (36.2 C) (Temporal)  11/25/18 98.4 F (36.9 C) (Oral)  09/28/18 (!) 97.5 F (36.4 C)   BP Readings from Last 3 Encounters:  07/26/19 126/86  05/27/19 (!) 140/98  11/25/18 139/88   Pulse Readings from Last 3 Encounters:  07/26/19 75   05/27/19 73  11/25/18 66    Physical Exam Vitals and nursing note reviewed.  Constitutional:      Appearance: Normal appearance. She is well-developed and well-groomed. She is morbidly obese.  HENT:     Head: Normocephalic and atraumatic.  Eyes:     Conjunctiva/sclera: Conjunctivae normal.     Pupils: Pupils are equal, round, and reactive to light.  Cardiovascular:     Rate and Rhythm: Normal rate and regular rhythm.     Heart sounds: Normal heart sounds. No murmur.  Pulmonary:     Effort: Pulmonary effort is normal.     Breath sounds: Normal breath sounds.  Skin:    General: Skin is warm and dry.     Comments: Intertrigo left thigh   Neurological:     General: No focal deficit present.     Mental Status: She is alert and oriented to person, place, and time. Mental status is at baseline.     Gait: Gait normal.  Psychiatric:        Attention and Perception: Attention and perception normal.        Mood and Affect: Mood and affect normal.        Speech: Speech normal.        Behavior: Behavior normal. Behavior is cooperative.        Thought Content: Thought content normal.        Cognition and Memory: Cognition and memory normal.        Judgment: Judgment normal.     Assessment  Plan  Essential hypertension - Plan: amLODipine (NORVASC) 2.5 MG tablet Monitor BP  Anxiety and depression F/u psych wants to change psych and needs new therapist CC Dr. Shea Evans today per Dr. Shea Evans pt declined change psych and will set up with therapy Not taking buspar 15 bid or abilify 20m qd  Intertrigo - Plan: clotrimazole (CLOTRIMAZOLE AF) 1 % cream gold bond talc free powder  Check on referral for plastics pt wants excess skin removed Dr. DRocky Morel Chronic pain of both knees - Plan: Ambulatory referral to Orthopedic Surgery Dr. PPosey ProntoAnserine bursitis - Plan: Ambulatory referral to Orthopedic Surgery  Mild intermittent asthma, unspecified whether complicated Controlled    Chronic midline low back pain with sciatica, sciatica laterality unspecified Lumbar herniated disc Lumbar radiculopathy Abnormal MRI lumbar  F/u Dr. YSherrye PayorPT ordered not sure if had yet [facility]   Migraine without aura and without status migrainosus, not intractable Needs to call [facility] to reschedule appt   Morbid obesity with BMI of 40.0-44.9, adult ([place]  rec healthy diet and exercise   HM Never gets flu shot  Tdap ? Had 2009/2010likely due for repeat -pt wants to waitas of 12/21/18 disc again today covid 1/2 had reaction not getting 2nd dose  considershingrix in future rec hep B vaccinelow titer 6 08/18/17 Hep A immune  Pap pt has not had in a while wants to think about it if needed consider OB/GYNvs PCPpt wants to wDonnellyas of 12/21/18 and  has not had a pap in > 19 years per pt h/o trauma and hard to do paps she will think about this and we will re discuss  Colonoscopy7/14/20/EGDpoor prep needs repeatcolonoscopydone 09/28/18 prep poor and repeat sch 01/2019 Dr. Earley Favor GI pt never had  EGD 09/28/18 neg bx for eos esophagitis  Mammogram7/16/19 [place] today 07/26/19  DEXA7/16/19 normal  Of notehome sleep studyordered by lung MD Dr. Juanell Fairly never done from 08/2017   Provider: Dr. Olivia Mackie McLean-Scocuzza-Internal Medicine

## 2019-07-26 NOTE — Telephone Encounter (Signed)
Received message from primary care provider that patient needs another psychiatrist and a therapist.  I requested our coordinator as well as front desk staff to contact this patient to discuss.  Also advised to transfer her care to another provider within our system.  However patient discussed with coordinator that she does not want to be transferred at this time and wants to stay with writer.  She wants to start seeing a new therapist, she will call back at the end of this month to see if she can be placed on our new therapist's schedule.  Patient declines transfer to [facility] where we do have therapist.  Per coordinator all is well with her.

## 2019-07-26 NOTE — Addendum Note (Signed)
Addended by: Quentin Ore on: 07/26/2019 07:04 PM   Modules accepted: Orders

## 2019-07-29 ENCOUNTER — Telehealth: Payer: Self-pay | Admitting: Internal Medicine

## 2019-07-29 NOTE — Telephone Encounter (Signed)
I left vm on Crystal [phone] regarding pt referral and appt at Dr Kandace Blitz ofc

## 2019-08-02 ENCOUNTER — Telehealth: Payer: Self-pay | Admitting: Internal Medicine

## 2019-08-02 NOTE — Telephone Encounter (Signed)
Please advise 

## 2019-08-02 NOTE — Telephone Encounter (Signed)
I spoke with Crystal at Dr Diamond Nickel office plastic surgeon she stated that pt BMI is 42.3 and weight is 254 pt needs to be BMI 35 or lower and weight needs to be below 210.

## 2019-08-02 NOTE — Telephone Encounter (Signed)
Plastics denied referral  If she wants to see bariatric surgery can refer there  TMS

## 2019-08-04 NOTE — Telephone Encounter (Signed)
LMTCB & sent patient mychart message.

## 2019-08-05 ENCOUNTER — Encounter: Payer: Self-pay | Admitting: Internal Medicine

## 2019-08-05 NOTE — Addendum Note (Signed)
Addended by: Quentin Ore on: 08/05/2019 05:28 PM   Modules accepted: Orders

## 2019-08-13 ENCOUNTER — Other Ambulatory Visit: Payer: Self-pay | Admitting: Psychiatry

## 2019-08-13 DIAGNOSIS — F4310 Post-traumatic stress disorder, unspecified: Secondary | ICD-10-CM

## 2019-08-13 DIAGNOSIS — F331 Major depressive disorder, recurrent, moderate: Secondary | ICD-10-CM

## 2019-08-22 ENCOUNTER — Encounter: Payer: Self-pay | Admitting: Psychiatry

## 2019-08-22 ENCOUNTER — Telehealth (INDEPENDENT_AMBULATORY_CARE_PROVIDER_SITE_OTHER): Payer: Medicare Other | Admitting: Psychiatry

## 2019-08-22 ENCOUNTER — Telehealth: Payer: Self-pay | Admitting: Cardiovascular Disease

## 2019-08-22 ENCOUNTER — Other Ambulatory Visit: Payer: Self-pay

## 2019-08-22 DIAGNOSIS — F4310 Post-traumatic stress disorder, unspecified: Secondary | ICD-10-CM

## 2019-08-22 DIAGNOSIS — F411 Generalized anxiety disorder: Secondary | ICD-10-CM | POA: Diagnosis not present

## 2019-08-22 DIAGNOSIS — F1590 Other stimulant use, unspecified, uncomplicated: Secondary | ICD-10-CM

## 2019-08-22 DIAGNOSIS — I25118 Atherosclerotic heart disease of native coronary artery with other forms of angina pectoris: Secondary | ICD-10-CM | POA: Diagnosis not present

## 2019-08-22 DIAGNOSIS — F3341 Major depressive disorder, recurrent, in partial remission: Secondary | ICD-10-CM

## 2019-08-22 DIAGNOSIS — F5105 Insomnia due to other mental disorder: Secondary | ICD-10-CM | POA: Diagnosis not present

## 2019-08-22 DIAGNOSIS — Z91199 Patient's noncompliance with other medical treatment and regimen due to unspecified reason: Secondary | ICD-10-CM

## 2019-08-22 DIAGNOSIS — Z9119 Patient's noncompliance with other medical treatment and regimen: Secondary | ICD-10-CM

## 2019-08-22 MED ORDER — DULOXETINE HCL 60 MG PO CPEP: 60.0000 mg | ORAL_CAPSULE | Freq: Two times a day (BID) | ORAL | 0 refills | Status: DC

## 2019-08-22 NOTE — Progress Notes (Signed)
Provider Location : [facility] Patient Location : Home  Virtual Visit via Video Note  I connected with Cheyenne Gray on 08/22/19 at 10:00 AM EDT by a video enabled telemedicine application and verified that I am speaking with the correct person using two identifiers.   I discussed the limitations of evaluation and management by telemedicine and the availability of in person appointments. The patient expressed understanding and agreed to proceed.    I discussed the assessment and treatment plan with the patient. The patient was provided an opportunity to ask questions and all were answered. The patient agreed with the plan and demonstrated an understanding of the instructions.   The patient was advised to call back or seek an in-person evaluation if the symptoms worsen or if the condition fails to improve as anticipated.   [place] MD OP Progress Note  08/22/2019 12:02 PM [name]  MRN:  [id number]  Chief Complaint:  Chief Complaint    Follow-up     HPI: Cheyenne Gray is a 54 year old Caucasian female, widowed on disability, has a history of PTSD, MDD, GAD, insomnia, caffeine use disorder, history of prolonged QT, chronic back pain on methadone, vitamin B12 deficiency, heart failure, iron deficiency, vitamin D deficiency, history of gastric bypass, MI, hypertension was evaluated by telemedicine today.  Patient today reports she is currently struggling with pain.  She reports she had recent imaging which showed that she had multiple bulging disc and may need surgery.  She also has bilateral knee pain and has difficulty standing and walking for too long.   She hence reports that she has difficulty taking care of things like cooking and yard work however overall she has been trying to manage without any help.  She does have her daughter who comes in on and off and that definitely helps.  She is worried about gaining too much weight.  She has a history of gastric bypass 20 years ago and  does not want to go there anymore.  She reports sleep as better than before.  She is able to sleep 4 hours straight which is an improvement for her.  She does not take the Belsomra regularly since she does not want anything that can make her drowsy at night.  She reports she lives in [place] neighborhood and hence want to wake up if she hears anything going on.  Patient reports with all her current stressors she does have anxiety and sadness.  She reports this is getting worse since the past few weeks.  She did not tolerate the BuSpar and did not think it was effective so she has not been compliant on it.  Patient also has been noncompliant with psychotherapy sessions.  She denies suicidality, homicidality or perceptual disturbances.  Visit Diagnosis:    ICD-10-CM   1. PTSD (post-traumatic stress disorder)  F43.10 DULoxetine (CYMBALTA) 60 MG capsule  2. GAD (generalized anxiety disorder)  F41.1 DULoxetine (CYMBALTA) 60 MG capsule  3. MDD (major depressive disorder), recurrent, in partial remission (HCC)  F33.41 DULoxetine (CYMBALTA) 60 MG capsule  4. Insomnia due to mental condition  F51.05   5. 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 Noncompliance with treatment regimen  Z91.19     Past Psychiatric History: I have reviewed past psychiatric history from my progress note on 11/06/2017.  Past trials of Paxil, Cymbalta, Valium, melatonin, trazodone, Ambien, Wellbutrin  Past Medical History:  Past Medical History:  Diagnosis Date  . (HFpEF) heart failure with preserved ejection  fraction [facility])    a. Echo 2014: EF 65-70%, nl WM, mildly dilated LA, PASP nl; b. 12/2014 Echo: EF 65-70%, no rwma, mod septal hypertrophy w/o LVOT gradient or SAM; c. 07/2017 Echo: EF 55-60%, no rwma, mildly dil RV w/ nl syst fxn. Mildly dil RA. Dilated IVC w/ elevated CVP. Triv post effusion.  Marland Kitchen Anxiety   . Arthritis   . Asthma   . B12 deficiency   . Chronic back pain   . Chronic headaches   . Chronic pain    a. on  methadone  . Concussion    hx of 4  . Coronary artery disease, non-occlusive    a. LHC 1/18: proximal to mid LAD 40% stenosed, mid LAD 30% stenosed, mid RCA 20% stenosed, distal RCA 20% stenosed, EF 55-65%, LVEDP normal  . Depression   . DJD (degenerative joint disease), multiple sites   . Frequent headaches   . Gallstone   . H/O non-ST elevation myocardial infarction (NSTEMI)   . Hand, foot and mouth disease 2016  . History of shingles   . Hypertension   . Iron deficiency anemia   . Long QT interval   . Methadone use ([place])    managed by Dr. Primus Bravo  . Migraine    1x/mo  . Motion sickness    cars  . Obesity   . Palpitations    a. 24 hour Holter: NSR, sinus brady down to 48, occasional PVCs & couplets, 8 beats NSVT; b. 30 day event monitor 2015: NSR with rare PVC.  Marland Kitchen Psoriasis   . Syncope and collapse   . Vitamin D deficiency   . Wears dentures    full upper and lower    Past Surgical History:  Procedure Laterality Date  . ABDOMINOPLASTY     tummy tuck ? year   . [place] SURGERY  2001  . CARDIAC CATHETERIZATION Left 04/16/2016   Procedure: Left Heart Cath and Coronary Angiography;  Surgeon: [name], MD;  Location: [place] [place] [facility];  Service: Cardiovascular;  Laterality: Left;  . CHOLECYSTECTOMY  2001  . COLONOSCOPY WITH PROPOFOL N/A 09/28/2018   Procedure: COLONOSCOPY WITH PROPOFOL;  Surgeon: Virgel Manifold, MD;  Location: [place];  Service: Endoscopy;  Laterality: N/A;  . ESOPHAGOGASTRODUODENOSCOPY (EGD) WITH PROPOFOL N/A 09/28/2018   Procedure: ESOPHAGOGASTRODUODENOSCOPY (EGD) WITH BIOPSY;  Surgeon: Virgel Manifold, MD;  Location: [facility];  Service: Endoscopy;  Laterality: N/A;  . GALLBLADDER SURGERY    . GASTRIC BYPASS  2001  . GASTROPLASTY      Family Psychiatric History: I have reviewed family psychiatric history from my progress note on 11/06/2017  Family History:  Family History  Problem Relation Age of Onset  .  Heart attack Mother   . Cancer Mother        pancreatitic   . Early death Mother   . Heart attack Father 3       MI  . Early death Father   . Heart disease Father   . Heart attack Brother   . Heart disease Brother   . Arthritis Brother   . Depression Brother   . Diabetes Brother   . Heart attack Maternal Grandmother   . Cancer Maternal Grandmother        pancreatitic   . Heart disease Maternal Grandmother   . Cancer Maternal Uncle        pancreatitic   . Cancer Paternal Grandmother        ? type   .  Diabetes Paternal Grandmother   . Cancer Maternal Uncle        pancreatitic   . Breast cancer Maternal Aunt     Social History: I have reviewed social history from my progress note on 11/06/2017 Social History   Socioeconomic History  . Marital status: Widowed    Spouse name: Not on file  . Number of children: 1  . Years of education: assoc degree  . Highest education level: Associate degree: occupational, Hotel manager, or vocational program  Occupational History  . Not on file  Tobacco Use  . Smoking status: Never Smoker  . Smokeless tobacco: Never Used  Substance and Sexual Activity  . Alcohol use: No    Alcohol/week: 0.0 standard drinks    Comment: holidays  . Drug use: No  . Sexual activity: Not Currently  Other Topics Concern  . 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 Difficulty of Paying Living Expenses:   Food Insecurity:   . Worried About Charity fundraiser in the Last Year:   . Arboriculturist in the Last Year:   Transportation Needs:   . Film/video editor (Medical):   Marland Kitchen Lack of Transportation (Non-Medical):   Physical Activity:   . Days of Exercise  per Week:   . Minutes of Exercise per Session:   Stress:   . Feeling of Stress :   Social Connections:   . Frequency of Communication with Friends and Family:   . Frequency of Social Gatherings with Friends and Family:   . Attends Religious Services:   . Active Member of Clubs or Organizations:   . Attends Archivist Meetings:   Marland Kitchen Marital Status:     Allergies:  Allergies  Allergen Reactions  . Covid-19 (Mrna) Vaccine Therapist, music) [Covid-19 (Mrna) Vaccine] Itching, Palpitations and Other (See Comments)    Throat closing.   Marland Kitchen Penicillins Hives, Shortness Of Breath and Rash    Has patient had a PCN reaction causing immediate rash, facial/tongue/throat swelling, SOB or lightheadedness with hypotension: Yes Has patient had a PCN reaction causing severe rash involving mucus membranes or skin necrosis: No Has patient had a PCN reaction that required hospitalization No Has patient had a PCN reaction occurring within the last 10 years: No If all of the above answers are "NO", then may proceed with Cephalosporin use.   . Bee Venom     Metabolic Disorder Labs: Lab Results  Component Value Date   HGBA1C 5.3 09/01/2018   No results found for: PROLACTIN Lab Results  Component Value Date   CHOL 176 09/01/2018   TRIG 113.0 09/01/2018   HDL 57.90 09/01/2018   CHOLHDL 3 09/01/2018   VLDL 22.6 09/01/2018   LDLCALC 95 09/01/2018   LDLCALC 47 10/02/2016   Lab Results  Component Value Date   TSH 2.63 09/01/2018   TSH 3.037 07/26/2017    Therapeutic Level Labs: No results found for: LITHIUM No results found for: VALPROATE No components found for:  CBMZ  Current Medications: Current Outpatient Medications  Medication Sig Dispense Refill  . albuterol (PROVENTIL HFA;VENTOLIN  HFA) 108 (90 BASE) MCG/ACT inhaler Inhale 2 puffs into the lungs as needed for wheezing.    Marland Kitchen amLODipine (NORVASC) 2.5 MG tablet Take 1 tablet (2.5 mg total) by mouth daily. 90 tablet 3  . ARIPiprazole  (ABILIFY) 2 MG tablet TAKE 1.5 TABLETS (3 MG TOTAL) BY MOUTH DAILY. 135 tablet 0  . Cholecalciferol (VITAMIN D3) 125 MCG (5000 UT) TABS Take by mouth daily.    . clotrimazole (CLOTRIMAZOLE AF) 1 % cream Apply 1 application topically 2 (two) times daily. Inner thigh 60 g 12  . diclofenac sodium (VOLTAREN) 1 % GEL Apply 4 g topically 4 (four) times daily. Prn knees and 2 grams qid prn hands arthritis 100 g 11  . diclofenac Sodium (VOLTAREN) 1 % GEL Apply 2-4 g topically 4 (four) times daily. 2 grams upper extremities and 4 grams qid prn lower extremities 150 g 11  . DULoxetine (CYMBALTA) 60 MG capsule Take 1 capsule (60 mg total) by mouth 2 (two) times daily. 180 capsule 0  . EPINEPHrine 0.3 mg/0.3 mL IJ SOAJ injection Inject 0.3 mLs (0.3 mg total) into the muscle as needed for anaphylaxis. 2 each 0  . folic acid (FOLVITE) 1 MG tablet Take by mouth.    . furosemide (LASIX) 20 MG tablet Take 1 tablet (20 mg total) by mouth daily as needed. (Patient not taking: Reported on 07/26/2019) 90 tablet 0  . gabapentin (NEURONTIN) 400 MG capsule Limit 2 tablets in the a.m. and midday and 3 tablets each evening 630 capsule 3  . isosorbide mononitrate (IMDUR) 60 MG 24 hr tablet Take 1 tablet (60 mg total) by mouth daily. 90 tablet 2  . methadone (DOLOPHINE) 10 MG tablet Limit 1-2 tablets by mouth 2-3 times per day if tolerated 180 tablet 0  . mupirocin ointment (BACTROBAN) 2 % Apply 1 application topically 3 (three) times daily. Hands open wounds 30 g 0  . nitroGLYCERIN (NITROSTAT) 0.4 MG SL tablet Place 1 tablet (0.4 mg total) under the tongue every 5 (five) minutes as needed for chest pain. 25 tablet 3  . rOPINIRole (REQUIP) 0.25 MG tablet Take 2-3 tablets (0.5-0.75 mg total) by mouth at bedtime. 90 tablet 11  . rosuvastatin (CRESTOR) 10 MG tablet Take 1 tablet (10 mg total) by mouth daily. 90 tablet 3  . Suvorexant (BELSOMRA) 10 MG TABS Take 10 mg by mouth at bedtime. 30 tablet 1  . triamcinolone cream (KENALOG)  0.1 % Apply 1 application topically 2 (two) times daily. 80 g 0  . vitamin B-12 (CYANOCOBALAMIN) 500 MCG tablet Take by mouth.     No current facility-administered medications for this visit.     Musculoskeletal: Strength & Muscle Tone: UTA Gait & Station: normal Patient leans: N/A  Psychiatric Specialty Exam: Review of Systems  Musculoskeletal: Positive for back pain.  Psychiatric/Behavioral: Positive for dysphoric mood and sleep disturbance (Improving). The patient is nervous/anxious.   All other systems reviewed and are negative.   Last menstrual period 02/18/2016.There is no height or weight on file to calculate BMI.  General Appearance: Casual  Eye Contact:  Fair  Speech:  Normal Rate  Volume:  Normal  Mood:  Anxious and Dysphoric  Affect:  Congruent  Thought Process:  Goal Directed and Descriptions of Associations: Intact  Orientation:  Full (Time, Place, and Person)  Thought Content: Logical   Suicidal Thoughts:  No  Homicidal Thoughts:  No  Memory:  Immediate;   Fair Recent;   Fair Remote;   Fair  Judgement:  Fair  Insight:  Fair  Psychomotor Activity:  Normal  Concentration:  Concentration: Fair and Attention Span: Fair  Recall:  [employer] of Knowledge: Fair  Language: Fair  Akathisia:  No  Handed:  Right  AIMS (if indicated): UTA  Assets:  Communication Skills Desire for Improvement Housing Social Support  ADL's:  Intact  Cognition: WNL  Sleep:  Improving   Screenings: GAD-7     Office Visit from 07/26/2019 in [facility] Office Visit from 09/21/2015 in [facility]  Total GAD-7 Score  15  4    Mini-Mental     Office Visit from 10/30/2017 in [facility]  Total Score (max 30 points )  27    PHQ2-9     [facility] Visit from 07/26/2019 in [facility] Office Visit from 12/21/2018 in [facility] Office Visit from 08/18/2017 in [facility] Office Visit from 11/15/2015 in [facility] Office Visit from 10/18/2015 in [facility]  PHQ-2 Total Score  5  0  0  0  0  PHQ-9 Total Score  19  --  --  --  --       Assessment and Plan: Nou NOLYN EILERT is a 54 year old Caucasian female, widowed, has a history of PTSD, depression, anxiety, multiple medical problems including coronary artery disease, prolonged QT per history, gastric bypass, vitamin B12 deficiency, vitamin D deficiency, chronic pain on methadone was evaluated by telemedicine today.  Patient is biologically predisposed given her multiple health problems including pain, history of trauma and family history of mental health problems.  Patient with current psychosocial stressors of her health issues and other situational stressors.  She will benefit from medication readjustment and psychotherapy sessions.  Patient however has been noncompliant with therapy recommendations.  Plan as noted below.  Plan PTSD-stable Cymbalta as prescribed Restart CBT  MDD-unstable Increase Cymbalta to 60 mg p.o. twice daily Abilify 3 mg p.o. daily Patient advised to restart CBT, have sent a message to our front desk to schedule patient with our therapist.  GAD-unstable Discontinue BuSpar for noncompliance Increase Cymbalta to 60 mg p.o. twice daily Restart CBT  Insomnia-improving Belsomra 5 to 10 mg p.o. nightly as needed Patient however is not compliant on the medication as summarized above I have reviewed [place] controlled substance database.  Caffeine use disorder-improving Provided counseling.  Noncompliance with treatment plan-encouraged compliance.  Provided education  Follow-up in clinic in 6 to 8 weeks or sooner if needed.  I have spent atleast 20 minutes non face to face with patient today. More than 50 % of the time was spent for preparing to see the patient ( e.g., review of test,  records ),  ordering medications and test ,psychoeducation and supportive psychotherapy and care coordination,as well as documenting clinical information in electronic health record. This note was generated in part or whole with voice recognition software. Voice recognition is usually quite accurate but there are transcription errors that can and very often do occur. I apologize for any typographical errors that were not detected and corrected.        Ursula Alert, MD 08/22/2019, 12:02 PM

## 2019-08-22 NOTE — Telephone Encounter (Signed)
Error

## 2019-09-02 ENCOUNTER — Encounter: Payer: Self-pay | Admitting: Internal Medicine

## 2019-09-02 NOTE — Telephone Encounter (Signed)
Pended for your approval or denial.  ° °

## 2019-09-04 MED ORDER — ALBUTEROL SULFATE HFA 108 (90 BASE) MCG/ACT IN AERS: 1.0000 | INHALATION_SPRAY | RESPIRATORY_TRACT | 11 refills | Status: DC | PRN

## 2019-09-29 ENCOUNTER — Other Ambulatory Visit: Payer: Self-pay | Admitting: Cardiovascular Disease

## 2019-10-13 ENCOUNTER — Telehealth: Payer: Medicare Other | Admitting: Psychiatry

## 2019-10-19 ENCOUNTER — Other Ambulatory Visit: Payer: Self-pay

## 2019-10-19 ENCOUNTER — Encounter: Payer: Self-pay | Admitting: Psychiatry

## 2019-10-19 ENCOUNTER — Telehealth (INDEPENDENT_AMBULATORY_CARE_PROVIDER_SITE_OTHER): Payer: Medicare Other | Admitting: Psychiatry

## 2019-10-19 DIAGNOSIS — F411 Generalized anxiety disorder: Secondary | ICD-10-CM

## 2019-10-19 DIAGNOSIS — I25118 Atherosclerotic heart disease of native coronary artery with other forms of angina pectoris: Secondary | ICD-10-CM | POA: Diagnosis not present

## 2019-10-19 DIAGNOSIS — F4310 Post-traumatic stress disorder, unspecified: Secondary | ICD-10-CM

## 2019-10-19 DIAGNOSIS — F1590 Other stimulant use, unspecified, uncomplicated: Secondary | ICD-10-CM

## 2019-10-19 DIAGNOSIS — F331 Major depressive disorder, recurrent, moderate: Secondary | ICD-10-CM | POA: Diagnosis not present

## 2019-10-19 DIAGNOSIS — F5105 Insomnia due to other mental disorder: Secondary | ICD-10-CM

## 2019-10-19 MED ORDER — DESVENLAFAXINE SUCCINATE ER 25 MG PO TB24: 25.0000 mg | ORAL_TABLET | Freq: Every day | ORAL | 1 refills | Status: DC

## 2019-10-19 MED ORDER — ARIPIPRAZOLE 2 MG PO TABS: 3.0000 mg | ORAL_TABLET | Freq: Every day | ORAL | 0 refills | Status: DC

## 2019-10-19 NOTE — Progress Notes (Signed)
Provider Location : [facility] Patient Location : Home  Virtual Visit via Video Note  I connected with Cheyenne Gray on 10/19/19 at  2:20 PM EDT by a video enabled telemedicine application and verified that I am speaking with the correct person using two identifiers.   I discussed the limitations of evaluation and management by telemedicine and the availability of in person appointments. The patient expressed understanding and agreed to proceed.    I discussed the assessment and treatment plan with the patient. The patient was provided an opportunity to ask questions and all were answered. The patient agreed with the plan and demonstrated an understanding of the instructions.   The patient was advised to call back or seek an in-person evaluation if the symptoms worsen or if the condition fails to improve as anticipated.  [place] MD OP Progress Note  10/19/2019 5:30 PM Cheyenne Gray  MRN:  [id number]  Chief Complaint:  Chief Complaint    Follow-up     HPI: Cheyenne Gray is a 54 year old Caucasian female, widowed, on disability, has a history of PTSD, MDD, GAD, insomnia, caffeine use disorder, history of prolonged QT, chronic back pain on methadone, vitamin B12 deficiency, heart failure, iron deficiency, vitamin D deficiency, history of gastric bypass, MI, hypertension was evaluated by telemedicine today.  A video call was initiated however due to connection problem it had to be changed to a phone call.  Patient today reports she is currently struggling with situational stressors.  She reports she is currently being evicted from her house because the owner is selling the home.  Patient reports she currently does not have a place to move into however is looking into options of buying a home and has applied for a mortgage.  She reports in the meantime she has an RV available and she may be able to stay in it.  She does report sleep problems most recently due to hot flashes.  She however has been  noncompliant with her sleep medications.  She currently has her daughter with her and reports ever since her daughter got home she has been sleeping better.  She reports otherwise she is worried about taking the sleep medication since she wants to be alert about what is going on around her home at night.  She is worried that she lives in a bad neighborhood and is worried about her safety.  She reports she is currently struggling with sadness, anxiety symptoms, hot flashes which does affect her mood and also sleep.  She does not believe the Cymbalta higher dosage is beneficial.  Patient denies any suicidality, homicidality or perceptual disturbances.  Patient denies any other concerns today.  Visit Diagnosis:    ICD-10-CM   1. PTSD (post-traumatic stress disorder)  F43.10 Desvenlafaxine Succinate ER (PRISTIQ) 25 MG TB24    ARIPiprazole (ABILIFY) 2 MG tablet  2. GAD (generalized anxiety disorder)  F41.1 Desvenlafaxine Succinate ER (PRISTIQ) 25 MG TB24  3. MDD (major depressive disorder), recurrent episode, moderate (HCC)  F33.1 Desvenlafaxine Succinate ER (PRISTIQ) 25 MG TB24  4. Insomnia due to mental condition  F51.05   5. 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  Past trials of Paxil, Cymbalta, Valium, melatonin, trazodone, Ambien, Wellbutrin  Past Medical History:  Past Medical History:  Diagnosis Date  . (HFpEF) heart failure with preserved ejection fraction ([place])    a. Echo 2014: EF 65-70%, nl  WM, mildly dilated LA, PASP nl; b. 12/2014 Echo: EF 65-70%, no rwma, mod septal hypertrophy w/o LVOT gradient or SAM; c. 07/2017 Echo: EF 55-60%, no rwma, mildly dil RV w/ nl syst fxn. Mildly dil RA. Dilated IVC w/ elevated CVP. Triv post effusion.  Marland Kitchen Anxiety   . Arthritis   . Asthma   . B12 deficiency   . Chronic back pain   . Chronic headaches   . Chronic pain    a. on methadone  . Concussion     hx of 4  . Coronary artery disease, non-occlusive    a. LHC 1/18: proximal to mid LAD 40% stenosed, mid LAD 30% stenosed, mid RCA 20% stenosed, distal RCA 20% stenosed, EF 55-65%, LVEDP normal  . Depression   . DJD (degenerative joint disease), multiple sites   . Frequent headaches   . Gallstone   . H/O non-ST elevation myocardial infarction (NSTEMI)   . Hand, foot and mouth disease 2016  . History of shingles   . Hypertension   . Iron deficiency anemia   . Long QT interval   . Methadone use ([place])    managed by Dr. Primus Bravo  . Migraine    1x/mo  . Motion sickness    cars  . Obesity   . Palpitations    a. 24 hour Holter: NSR, sinus brady down to 48, occasional PVCs & couplets, 8 beats NSVT; b. 30 day event monitor 2015: NSR with rare PVC.  Marland Kitchen Psoriasis   . Syncope and collapse   . Vitamin D deficiency   . Wears dentures    full upper and lower    Past Surgical History:  Procedure Laterality Date  . ABDOMINOPLASTY     tummy tuck ? year   . [place] SURGERY  2001  . CARDIAC CATHETERIZATION Left 04/16/2016   Procedure: Left Heart Cath and Coronary Angiography;  Surgeon: [name], MD;  Location: [facility];  Service: Cardiovascular;  Laterality: Left;  . CHOLECYSTECTOMY  2001  . COLONOSCOPY WITH PROPOFOL N/A 09/28/2018   Procedure: COLONOSCOPY WITH PROPOFOL;  Surgeon: Virgel Manifold, MD;  Location: [facility];  Service: Endoscopy;  Laterality: N/A;  . ESOPHAGOGASTRODUODENOSCOPY (EGD) WITH PROPOFOL N/A 09/28/2018   Procedure: ESOPHAGOGASTRODUODENOSCOPY (EGD) WITH BIOPSY;  Surgeon: Virgel Manifold, MD;  Location: [facility];  Service: Endoscopy;  Laterality: N/A;  . GALLBLADDER SURGERY    . GASTRIC BYPASS  2001  . GASTROPLASTY      Family Psychiatric History: I have reviewed family psychiatric history from my progress note on 11/06/2017  Family History:  Family History  Problem Relation Age of Onset  . Heart attack Mother   .  Cancer Mother        pancreatitic   . Early death Mother   . Heart attack Father 44       MI  . Early death Father   . Heart disease Father   . Heart attack Brother   . Heart disease Brother   . Arthritis Brother   . Depression Brother   . Diabetes Brother   . Heart attack Maternal Grandmother   . Cancer Maternal Grandmother        pancreatitic   . Heart disease Maternal Grandmother   . Cancer Maternal Uncle        pancreatitic   . Cancer Paternal Grandmother        ? type   . Diabetes Paternal Grandmother   . Cancer Maternal  Uncle        pancreatitic   . Breast cancer Maternal Aunt     Social History: I have reviewed social history from my progress note on 11/06/2017 Social History   Socioeconomic History  . Marital status: Widowed    Spouse name: Not on file  . Number of children: 1  . Years of education: assoc degree  . Highest education level: Associate degree: occupational, Hotel manager, or vocational program  Occupational History  . Not on file  Tobacco Use  . Smoking status: Never Smoker  . Smokeless tobacco: Never Used  Vaping Use  . Vaping Use: Never used  Substance and Sexual Activity  . Alcohol use: No    Alcohol/week: 0.0 standard drinks    Comment: holidays  . Drug use: No  . Sexual activity: Not Currently  Other Topics Concern  . 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 Difficulty of Paying Living Expenses:   Food Insecurity:   . Worried About Charity fundraiser in the Last Year:   . Arboriculturist in the Last Year:   Transportation Needs:   . Film/video editor (Medical):   Marland Kitchen Lack of Transportation (Non-Medical):   Physical Activity:   .  Days of Exercise per Week:   . Minutes of Exercise per Session:   Stress:   . Feeling of Stress :   Social Connections:   . Frequency of Communication with Friends and Family:   . Frequency of Social Gatherings with Friends and Family:   . Attends Religious Services:   . Active Member of Clubs or Organizations:   . Attends Archivist Meetings:   Marland Kitchen Marital Status:     Allergies:  Allergies  Allergen Reactions  . Covid-19 (Mrna) Vaccine Therapist, music) [Covid-19 (Mrna) Vaccine] Itching, Palpitations and Other (See Comments)    Throat closing.   Marland Kitchen Penicillins Hives, Shortness Of Breath and Rash    Has patient had a PCN reaction causing immediate rash, facial/tongue/throat swelling, SOB or lightheadedness with hypotension: Yes Has patient had a PCN reaction causing severe rash involving mucus membranes or skin necrosis: No Has patient had a PCN reaction that required hospitalization No Has patient had a PCN reaction occurring within the last 10 years: No If all of the above answers are "NO", then may proceed with Cephalosporin use.   . Bee Venom     Metabolic Disorder Labs: Lab Results  Component Value Date   HGBA1C 5.3 09/01/2018   No results found for: PROLACTIN Lab Results  Component Value Date   CHOL 176 09/01/2018   TRIG 113.0 09/01/2018   HDL 57.90 09/01/2018   CHOLHDL 3 09/01/2018   VLDL 22.6 09/01/2018   LDLCALC 95 09/01/2018   LDLCALC 47 10/02/2016   Lab Results  Component Value Date   TSH 2.63 09/01/2018   TSH 3.037 07/26/2017    Therapeutic Level Labs: No results found for: LITHIUM No results found for: VALPROATE No components found for:  CBMZ  Current Medications: Current Outpatient Medications  Medication Sig Dispense Refill  . albuterol (VENTOLIN  HFA) 108 (90 Base) MCG/ACT inhaler Inhale 1-2 puffs into the lungs as needed for wheezing. 18 g 11  . amLODipine (NORVASC) 2.5 MG tablet Take 1 tablet (2.5 mg total) by mouth daily. 90 tablet 3  .  ARIPiprazole (ABILIFY) 2 MG tablet Take 1.5 tablets (3 mg total) by mouth daily. 135 tablet 0  . Cholecalciferol (VITAMIN D3) 125 MCG (5000 UT) TABS Take by mouth daily.    . clotrimazole (CLOTRIMAZOLE AF) 1 % cream Apply 1 application topically 2 (two) times daily. Inner thigh 60 g 12  . Desvenlafaxine Succinate ER (PRISTIQ) 25 MG TB24 Take 25 mg by mouth at bedtime. 30 tablet 1  . diclofenac sodium (VOLTAREN) 1 % GEL Apply 4 g topically 4 (four) times daily. Prn knees and 2 grams qid prn hands arthritis 100 g 11  . diclofenac Sodium (VOLTAREN) 1 % GEL Apply 2-4 g topically 4 (four) times daily. 2 grams upper extremities and 4 grams qid prn lower extremities 150 g 11  . EPINEPHrine 0.3 mg/0.3 mL IJ SOAJ injection Inject 0.3 mLs (0.3 mg total) into the muscle as needed for anaphylaxis. 2 each 0  . folic acid (FOLVITE) 1 MG tablet Take by mouth.    . furosemide (LASIX) 20 MG tablet TAKE 1 TABLET BY MOUTH EVERY DAY AS NEEDED 90 tablet 0  . gabapentin (NEURONTIN) 400 MG capsule Limit 2 tablets in the a.m. and midday and 3 tablets each evening 630 capsule 3  . isosorbide mononitrate (IMDUR) 60 MG 24 hr tablet Take 1 tablet (60 mg total) by mouth daily. 90 tablet 2  . methadone (DOLOPHINE) 10 MG tablet Limit 1-2 tablets by mouth 2-3 times per day if tolerated 180 tablet 0  . mupirocin ointment (BACTROBAN) 2 % Apply 1 application topically 3 (three) times daily. Hands open wounds 30 g 0  . nitroGLYCERIN (NITROSTAT) 0.4 MG SL tablet Place 1 tablet (0.4 mg total) under the tongue every 5 (five) minutes as needed for chest pain. 25 tablet 3  . rOPINIRole (REQUIP) 0.25 MG tablet Take 2-3 tablets (0.5-0.75 mg total) by mouth at bedtime. 90 tablet 11  . rosuvastatin (CRESTOR) 10 MG tablet Take 1 tablet (10 mg total) by mouth daily. 90 tablet 3  . Suvorexant (BELSOMRA) 10 MG TABS Take 10 mg by mouth at bedtime. 30 tablet 1  . triamcinolone cream (KENALOG) 0.1 % Apply 1 application topically 2 (two) times daily.  80 g 0  . vitamin B-12 (CYANOCOBALAMIN) 500 MCG tablet Take by mouth.     No current facility-administered medications for this visit.     Musculoskeletal: Strength & Muscle Tone: UTA Gait & Station: UTA Patient leans: N/A  Psychiatric Specialty Exam: Review of Systems  Psychiatric/Behavioral: Positive for dysphoric mood and sleep disturbance. The patient is nervous/anxious.   All other systems reviewed and are negative.   Last menstrual period 02/18/2016.There is no height or weight on file to calculate BMI.  General Appearance: UTA  Eye Contact:  UTA  Speech:  Clear and Coherent  Volume:  Normal  Mood:  Anxious, Depressed and Dysphoric  Affect:  UTA  Thought Process:  Goal Directed and Descriptions of Associations: Intact  Orientation:  Full (Time, Place, and Person)  Thought Content: Logical   Suicidal Thoughts:  No  Homicidal Thoughts:  No  Memory:  Immediate;   Fair Recent;   Fair Remote;   Fair  Judgement:  Fair  Insight:  Fair  Psychomotor Activity:  UTA  Concentration:  Concentration:  Fair and Attention Span: Fair  Recall:  [employer] of Knowledge: Fair  Language: Fair  Akathisia:  No  Handed:  Right  AIMS (if indicated): UTA  Assets:  Communication Skills Desire for Improvement Housing Social Support  ADL's:  Intact  Cognition: WNL  Sleep:  Restless- due to hot flashes   Screenings: GAD-7     Office Visit from 07/26/2019 in [facility] Office Visit from 09/21/2015 in [facility]  Total GAD-7 Score 15 4    Mini-Mental     Office Visit from 10/30/2017 in [facility]  Total Score (max 30 points ) 27    PHQ2-9     Office Visit from 07/26/2019 in [facility] Office Visit from 12/21/2018 in [facility] Office Visit from 08/18/2017 in [facility] Office Visit from 11/15/2015 in [facility] Office Visit from 10/18/2015 in [facility]  PHQ-2 Total Score 5 0 0 0 0  PHQ-9 Total Score 19 -- -- -- --       Assessment and Plan: Cheyenne Gray is a 54 year old Caucasian female, widowed, has a history of PTSD, depression, anxiety, multiple medical problems including coronary artery disease, prolonged QT per history, gastric bypass, vitamin B12 deficiency, vitamin D deficiency, chronic pain on methadone was evaluated by telemedicine today.  Patient is biologically predisposed given her multiple health problems including pain, history of trauma, family history of mental health problems.  Patient with psychosocial stressors of her housing issue continues to struggle with mood and sleep.  She also has hot flashes which does affect her mood symptoms and sleep.  Discussed plan as noted below.  Plan PTSD-stable Continue medications as prescribed   MDD-unstable Taper of Cymbalta.  Patient advised to take Cymbalta 60 mg p.o. daily for the next 3 days and stop taking it. Start Pristiq 25 mg p.o. nightly Abilify 3 mg p.o. daily Patient was advised to restart CBT in the past-pending  GAD-unstable Taper of Cymbalta Encouraged to start CBT Start Pristiq 25 mg p.o. nightly as prescribed  Insomnia-unstable Belsomra 5 to 10 mg p.o. nightly as needed however she has been noncompliant Start Pristiq 25 mg p.o. nightly. Pristiq may also help her with her hot flashes.  Caffeine use disorder-improving Will monitor closely  Noncompliance with treatment plan-encouraged compliance  Follow-up in clinic in 4 to 6 weeks or sooner if needed.  I have spent atleast 20 minutes non face to face with patient today. More than 50 % of the time was spent for preparing to see the patient ( e.g., review of test, records ),ordering medications and test ,psychoeducation and supportive psychotherapy and care coordination,as well as documenting clinical  information in electronic health record. This note was generated in part or whole with voice recognition software. Voice recognition is usually quite accurate but there are transcription errors that can and very often do occur. I apologize for any typographical errors that were not detected and corrected.       Ursula Alert, MD 10/19/2019, 5:30 PM

## 2019-10-24 ENCOUNTER — Inpatient Hospital Stay: Payer: Medicare Other

## 2019-10-24 ENCOUNTER — Inpatient Hospital Stay: Payer: Medicare Other | Admitting: Oncology

## 2019-10-24 ENCOUNTER — Telehealth: Payer: Self-pay

## 2019-10-24 NOTE — Telephone Encounter (Signed)
Pt missed lab appt today. Please reschedule lab/[facility]/ poss venofer, per pt availability. Thanks

## 2019-10-24 NOTE — Telephone Encounter (Signed)
I was unable to reach pt by phone x2 A detailed message was left for pt to contact the office back to  have her appts R/S for dates and times that works best for her.

## 2019-10-25 ENCOUNTER — Inpatient Hospital Stay: Payer: Medicare Other

## 2019-10-26 ENCOUNTER — Other Ambulatory Visit: Payer: Medicare Other

## 2019-10-31 ENCOUNTER — Telehealth: Payer: Self-pay | Admitting: Cardiovascular Disease

## 2019-10-31 NOTE — Telephone Encounter (Signed)
Pt c/o Syncope: STAT if syncope occurred within 30 minutes and pt complains of lightheadedness High Priority if episode of passing out, completely, today or in last 24 hours   1. Did you pass out today? Yes at [facility] about 1:30 pm  2. When is the last time you passed out? Today is the 1st time  3. Has this occurred multiple times? no  4. Did you have any symptoms prior to passing out? Has had a headache for the past 2 weeks. Shaky and sweaty.  For the last 3 weeks has had 10-15 hot flashed per day.  Patient also states that last week she "felt like I was having a heart attack". States that the whole left side of her chest and shoulder down into her arm she couldn't move it. States it was difficult to breathe.

## 2019-10-31 NOTE — Telephone Encounter (Signed)
Spoke with patient and she states she did pass out in [place] and then got up. She reports not feeling well and last time this happened she had a mild heart attack. She has been having pain to her left side of chest, radiating to left arm, and having difficulty breathing at that time. She wanted to see if she could come in to see someone. Advised that she should go to the ED immediately or call 911 if these symptoms return. She was agreeable and scheduled her to come in and see APP next week. She verbalized understanding of our conversation, agreement with plan, and had no further questions at this time.

## 2019-10-31 NOTE — Telephone Encounter (Signed)
Recommend she remain well hydrated and make position changes slowly. If she has recurrent syncope, chest pain will need to present to ED. If she has BP cuff at home, would be helpful to check daily and bring log to next office visit.   We can discuss further workup at follow up including likely ZIO and possible echocardiogram.  She has appt with her PCP 11/01/19.   Alver Sorrow, NP

## 2019-11-01 ENCOUNTER — Ambulatory Visit: Payer: Medicare Other | Admitting: Internal Medicine

## 2019-11-01 ENCOUNTER — Telehealth: Payer: Self-pay | Admitting: Internal Medicine

## 2019-11-01 NOTE — Telephone Encounter (Signed)
Patient no-showed today's appointment; appointment was for 11/01/19 at 1:30 pm, provider notified for review of record. Mychart message sent for patient to re-schedule.

## 2019-11-01 NOTE — Telephone Encounter (Signed)
Spoke with patient and reviewed provider recommendations. She does not have appointment today with PCP but did verbalize understanding of need to call 911 or go to ED if this occurs again. Instructed her to monitor blood pressures at home and bring a log with those readings to her appointment. She verbalized understanding of our conversation with no further questions at this time.

## 2019-11-09 ENCOUNTER — Other Ambulatory Visit
Admission: RE | Admit: 2019-11-09 | Discharge: 2019-11-09 | Disposition: A | Discharge status: Home / Self Care | Payer: Medicare Other | Attending: Family | Admitting: Family

## 2019-11-09 ENCOUNTER — Ambulatory Visit (INDEPENDENT_AMBULATORY_CARE_PROVIDER_SITE_OTHER): Payer: Medicare Other | Admitting: Family

## 2019-11-09 ENCOUNTER — Other Ambulatory Visit: Payer: Self-pay

## 2019-11-09 ENCOUNTER — Ambulatory Visit (INDEPENDENT_AMBULATORY_CARE_PROVIDER_SITE_OTHER): Payer: Medicare Other

## 2019-11-09 ENCOUNTER — Encounter: Payer: Self-pay | Admitting: Family

## 2019-11-09 VITALS — BP 140/80 | HR 66 | Ht 65.0 in | Wt 249.4 lb

## 2019-11-09 DIAGNOSIS — I2510 Atherosclerotic heart disease of native coronary artery without angina pectoris: Secondary | ICD-10-CM | POA: Insufficient documentation

## 2019-11-09 DIAGNOSIS — R55 Syncope and collapse: Secondary | ICD-10-CM

## 2019-11-09 DIAGNOSIS — I493 Ventricular premature depolarization: Secondary | ICD-10-CM | POA: Diagnosis present

## 2019-11-09 DIAGNOSIS — E785 Hyperlipidemia, unspecified: Secondary | ICD-10-CM

## 2019-11-09 LAB — MAGNESIUM: Magnesium: 2 mg/dL (ref 1.7–2.4)

## 2019-11-09 LAB — BASIC METABOLIC PANEL
Anion gap: 11 (ref 5–15)
BUN: <5 mg/dL — ABNORMAL LOW (ref 6–20)
CO2: 28 mmol/L (ref 22–32)
Calcium: 8.8 mg/dL — ABNORMAL LOW (ref 8.9–10.3)
Chloride: 101 mmol/L (ref 98–111)
Creatinine, Ser: 0.82 mg/dL (ref 0.44–1.00)
GFR calc Af Amer: >60 mL/min (ref >60)
GFR calc non Af Amer: >60 mL/min (ref >60)
Glucose, Bld: 98 mg/dL (ref 70–99)
Potassium: 4 mmol/L (ref 3.5–5.1)
Sodium: 140 mmol/L (ref 135–145)

## 2019-11-09 LAB — TROPONIN I (HIGH SENSITIVITY): Troponin I (High Sensitivity): 14 ng/L (ref <18)

## 2019-11-09 LAB — CBC WITH DIFFERENTIAL/PLATELET
Abs Immature Granulocytes: 0.02 10*3/uL (ref 0.00–0.07)
Basophils Absolute: 0.1 10*3/uL (ref 0.0–0.1)
Basophils Relative: 1 %
Eosinophils Absolute: 0.3 10*3/uL (ref 0.0–0.5)
Eosinophils Relative: 4 %
HCT: 37.6 % (ref 36.0–46.0)
Hemoglobin: 12.7 g/dL (ref 12.0–15.0)
Immature Granulocytes: 0 %
Lymphocytes Relative: 38 %
Lymphs Abs: 3.3 10*3/uL (ref 0.7–4.0)
MCH: 30.8 pg (ref 26.0–34.0)
MCHC: 33.8 g/dL (ref 30.0–36.0)
MCV: 91 fL (ref 80.0–100.0)
Monocytes Absolute: 0.8 10*3/uL (ref 0.1–1.0)
Monocytes Relative: 10 %
Neutro Abs: 4.2 10*3/uL (ref 1.7–7.7)
Neutrophils Relative %: 47 %
Platelets: 219 10*3/uL (ref 150–400)
RBC: 4.13 MIL/uL (ref 3.87–5.11)
RDW: 12.4 % (ref 11.5–15.5)
WBC: 8.8 10*3/uL (ref 4.0–10.5)
nRBC: 0 % (ref 0.0–0.2)

## 2019-11-09 LAB — TSH: TSH: 3.25 u[IU]/mL (ref 0.350–4.500)

## 2019-11-09 MED ORDER — CARVEDILOL 6.25 MG PO TABS: 6.2500 mg | ORAL_TABLET | Freq: Two times a day (BID) | ORAL | 5 refills | Status: DC

## 2019-11-09 NOTE — Progress Notes (Signed)
Office Visit    Patient Name: Cheyenne Gray Date of Encounter: 11/09/2019  Primary Care Provider:  McLean-Scocuzza, Pasty Spillers, MD Primary Cardiologist:  Julien Nordmann, MD Electrophysiologist:  None   Chief Complaint    Cheyenne Gray is a 54 y.o. female with a hx of palpitations with findings of PVC on monitor, tachycardia, syncope, chest pain with nonobstructive CAD by catheterization early 2018, aortic atherosclerosis, HLD, obesity s/p bariatric surgery, long QT syndrome, chronic pain on methadone, PTSD presents today for syncope  Past Medical History    Past Medical History:  Diagnosis Date  . (HFpEF) heart failure with preserved ejection fraction (HCC)    a. Echo 2014: EF 65-70%, nl WM, mildly dilated LA, PASP nl; b. 12/2014 Echo: EF 65-70%, no rwma, mod septal hypertrophy w/o LVOT gradient or SAM; c. 07/2017 Echo: EF 55-60%, no rwma, mildly dil RV w/ nl syst fxn. Mildly dil RA. Dilated IVC w/ elevated CVP. Triv post effusion.  Marland Kitchen Anxiety   . Arthritis   . Asthma   . B12 deficiency   . Chronic back pain   . Chronic headaches   . Chronic pain    a. on methadone  . Concussion    hx of 4  . Coronary artery disease, non-occlusive    a. LHC 1/18: proximal to mid LAD 40% stenosed, mid LAD 30% stenosed, mid RCA 20% stenosed, distal RCA 20% stenosed, EF 55-65%, LVEDP normal  . Depression   . DJD (degenerative joint disease), multiple sites   . Frequent headaches   . Gallstone   . H/O non-ST elevation myocardial infarction (NSTEMI)   . Hand, foot and mouth disease 2016  . History of shingles   . Hypertension   . Iron deficiency anemia   . Long QT interval   . Methadone use (HCC)    managed by Dr. Metta Clines  . Migraine    1x/mo  . Motion sickness    cars  . Obesity   . Palpitations    a. 24 hour Holter: NSR, sinus brady down to 48, occasional PVCs & couplets, 8 beats NSVT; b. 30 day event monitor 2015: NSR with rare PVC.  Marland Kitchen Psoriasis   . Syncope and collapse   .  Vitamin D deficiency   . Wears dentures    full upper and lower   Past Surgical History:  Procedure Laterality Date  . ABDOMINOPLASTY     tummy tuck ? year   . BARIATRIC SURGERY  2001  . CARDIAC CATHETERIZATION Left 04/16/2016   Procedure: Left Heart Cath and Coronary Angiography;  Surgeon: Antonieta Iba, MD;  Location: [facility];  Service: Cardiovascular;  Laterality: Left;  . CHOLECYSTECTOMY  2001  . COLONOSCOPY WITH PROPOFOL N/A 09/28/2018   Procedure: COLONOSCOPY WITH PROPOFOL;  Surgeon: Pasty Spillers, MD;  Location: [facility];  Service: Endoscopy;  Laterality: N/A;  . ESOPHAGOGASTRODUODENOSCOPY (EGD) WITH PROPOFOL N/A 09/28/2018   Procedure: ESOPHAGOGASTRODUODENOSCOPY (EGD) WITH BIOPSY;  Surgeon: Pasty Spillers, MD;  Location: [facility];  Service: Endoscopy;  Laterality: N/A;  . GALLBLADDER SURGERY    . GASTRIC BYPASS  2001  . GASTROPLASTY      Allergies  Allergies  Allergen Reactions  . Covid-19 (Mrna) Vaccine Proofreader) [Covid-19 (Mrna) Vaccine] Itching, Palpitations and Other (See Comments)    Throat closing.   Marland Kitchen Penicillins Hives, Shortness Of Breath and Rash    Has patient had a PCN reaction causing immediate rash, facial/tongue/throat swelling, SOB  or lightheadedness with hypotension: Yes Has patient had a PCN reaction causing severe rash involving mucus membranes or skin necrosis: No Has patient had a PCN reaction that required hospitalization No Has patient had a PCN reaction occurring within the last 10 years: No If all of the above answers are "NO", then may proceed with Cephalosporin use.   Alphonsus Sias Venom     History of Present Illness    Cheyenne Gray is a 54 y.o. female with a hx of palpitations with findings of PVC on monitor, tachycardia, syncope, chest pain with nonobstructive CAD by catheterization early 2018, aortic atherosclerosis, HLD, obesity s/p bariatric surgery, long QT syndrome, chronic pain on methadone,  PTSD. She was last seen 05/27/2019 by Dr. Mariah Milling.  Previous event monitor in 2015 with predominantly normal sinus rhythm and rare PVC.  Prior echocardiogram October 2016 with normal LVEF.  Lexiscan Myoview 01/2017 low risk scan with small to moderate sized region of predominantly fixed infarct in mid to distal anteroseptal, anteroseptal apical wall with mild peri-infarct ischemia, EF 51%. Echo May 2019 with normal EF.  Performed in the setting of lower extremity swelling and weight gain.  Noted improvement in lower extremity swelling with 15 pound weight loss following initiation of low-dose Lasix.  Her Crestor was previously had to be discontinued due to elevated liver enzymes, but was able to be resumed.  At last clinic visit she noted mild lower extremity edema.  She was recommended for Lasix as needed.  She was also recommended to reduce intake of soda and Gatorade.  Called the office 10/31/2019 with report of syncope while in [employer].  She also noted a 2-week history of headache and multiple hot flashes.  She also noted pain in her left chest radiating to her arm associated with shortness of breath.  She declined ED evaluation.  Very pleasant lady who has multiple concerns today. She has had a dramatic increase in frequency of her hot flashes over the last two months.  She has had 2 syncopal episodes one at [facility] and one in her kitchen. Tells me she did not feel lightheaded nor dizzy prior to either episode. She did notice hot flash before each episode. Reports loss of consciousness was witnessed by family member and lasted only a few seconds. Took her about 20 minutes to recuperate.   Tells me she goes long standing periods of time wihtout eating. Tells me she does not get hungry. Will go multiple days without eating. Has set an alarm on her phone to remind her to eat. She drinks Coke throughout the day. She does about 5-7 cans of Coke per day.  Does not drink water.  Endorses a lot of stress  with her housing situation.   She reports intermittent episodes of left arm pain and chest pain.  Unclear frequency.  Tells me she had an episode last Saturday of left arm pain and chest pain while resting and tells me she felt "stuck "as if she could not speak.  The episode lasted about an hour.  She did not take nitroglycerin.  Reports memory issues. Plans to see neurology.    EKGs/Labs/Other Studies Reviewed:   The following studies were reviewed today: EKG:  EKG is ordered today.  The ekg ordered today demonstrates SR 66 bpm with frequent PVCs, LA enlargement and no acute ST/T wave changes.   Recent Labs: 01/16/2019: ALT 15 11/09/2019: BUN <5; Creatinine, Ser 0.82; Hemoglobin 12.7; Magnesium 2.0; Platelets 219; Potassium 4.0; Sodium 140; TSH 3.250  Recent Lipid Panel    Component Value Date/Time   CHOL 176 09/01/2018 0935   CHOL 114 10/02/2016 1037   CHOL 147 05/04/2013 1556   TRIG 113.0 09/01/2018 0935   TRIG 79 05/04/2013 1556   HDL 57.90 09/01/2018 0935   HDL 55 10/02/2016 1037   HDL 58 05/04/2013 1556   CHOLHDL 3 09/01/2018 0935   VLDL 22.6 09/01/2018 0935   VLDL 16 05/04/2013 1556   LDLCALC 95 09/01/2018 0935   LDLCALC 47 10/02/2016 1037   LDLCALC 73 05/04/2013 1556   Home Medications   Current Meds  Medication Sig  . albuterol (VENTOLIN HFA) 108 (90 Base) MCG/ACT inhaler Inhale 1-2 puffs into the lungs as needed for wheezing.  . clotrimazole (CLOTRIMAZOLE AF) 1 % cream Apply 1 application topically 2 (two) times daily. Inner thigh  . diclofenac sodium (VOLTAREN) 1 % GEL Apply 4 g topically 4 (four) times daily. Prn knees and 2 grams qid prn hands arthritis  . EPINEPHrine 0.3 mg/0.3 mL IJ SOAJ injection Inject 0.3 mLs (0.3 mg total) into the muscle as needed for anaphylaxis.  . furosemide (LASIX) 20 MG tablet TAKE 1 TABLET BY MOUTH EVERY DAY AS NEEDED  . gabapentin (NEURONTIN) 400 MG capsule Limit 2 tablets in the a.m. and midday and 3 tablets each evening  .  isosorbide mononitrate (IMDUR) 60 MG 24 hr tablet Take 1 tablet (60 mg total) by mouth daily.  . methadone (DOLOPHINE) 10 MG tablet Limit 1-2 tablets by mouth 2-3 times per day if tolerated  . nitroGLYCERIN (NITROSTAT) 0.4 MG SL tablet Place 1 tablet (0.4 mg total) under the tongue every 5 (five) minutes as needed for chest pain.  . rosuvastatin (CRESTOR) 10 MG tablet Take 1 tablet (10 mg total) by mouth daily.  Marland Kitchen. triamcinolone cream (KENALOG) 0.1 % Apply 1 application topically 2 (two) times daily.  . vitamin B-12 (CYANOCOBALAMIN) 500 MCG tablet Take by mouth.    Review of Systems     Review of Systems  Constitutional: Negative for chills, fever and malaise/fatigue.  Cardiovascular: Positive for chest pain, leg swelling and syncope. Negative for dyspnea on exertion, near-syncope, orthopnea and palpitations.  Respiratory: Negative for cough, shortness of breath and wheezing.   Gastrointestinal: Negative for nausea and vomiting.  Neurological: Negative for dizziness, light-headedness and weakness.   All other systems reviewed and are otherwise negative except as noted above.  Physical Exam    VS:  BP 140/80 (BP Location: Left Arm, Patient Position: Sitting, Cuff Size: Large)   Pulse 66   Ht 5\' 5"  (1.651 m)   Wt 249 lb 6 oz (113.1 kg)   LMP 02/18/2016 (Approximate) Comment: neg HCG-03/26/2016  SpO2 98%   BMI 41.50 kg/m  , BMI Body mass index is 41.5 kg/m. GEN: Well nourished, overweight, well developed, in no acute distress. HEENT: normal. Neck: Supple, no JVD, carotid bruits, or masses. Cardiac: RRR, no murmurs, rubs, or gallops. No clubbing, cyanosis, edema.  Radials/PT 2+ and equal bilaterally.  Respiratory:  Respirations regular and unlabored, clear to auscultation bilaterally. GI: Soft, nontender, nondistended. MS: No deformity or atrophy. Skin: Warm and dry, no rash. Neuro:  Strength and sensation are intact. Psych: Normal affect.  Assessment & Plan    1. Syncope - 2  episodes of syncope.  Loss of conscious for a few seconds witnessed by family member.  Reports no lightheadedness, dizziness, prodromal symptoms.  Concern for dehydration/hypoglycemic episodes and she shares with me that she goes multiple days without eating  and only drinks caffeinated Coke.  14-day live telemetry monitor placed today due to concern for arrhythmia is contributory to her episodes. Consider echocardiogram pending results.   2. CAD/aortic atherosclerosis -reports episode of left sided chest pain and arm pain that occurs at rest and makes her feel as if she is "stuck ".  EKG today without acute ST/T wave changes.  Symptoms somewhat atypical as they occur at rest.  Stat troponin drawn in the office was negative.  We will plan to proceed with monitor, as above and consider outpatient ischemic eval if she has recurrent symptoms.  She will continue long-acting nitrate, statin, and we will add beta-blocker.  3. HLD, LDL goal <70 - 08/2018 LDL 95. LDL goal <70. Continue Rosuvastatin 10mg  daily. Will require lipid panel with next lab draw.   4. PVC -frequent PVC noted on EKG today.  She reports palpitations and her blood pressure is also marginally elevated.  As such, start Coreg 6.25 mg twice daily.  She previously had intolerance to metoprolol with diarrhea number of years ago. TSH, magnesium, BMP, CBC today.  5. Lower extremity edema-  Reports well controlled. Using Lasix 20mg  PRN.   Disposition: Follow up in 4 week(s) with Dr. or APP.   , NP 11/09/2019, 7:43 PM

## 2019-11-09 NOTE — Patient Instructions (Signed)
Medication Instructions:  Your physician has recommended you make the following change in your medication:   START Carvedilol (Coreg) 6.25mg  twice daily  *If you need a refill on your cardiac medications before your next appointment, please call your pharmacy*  Lab Work: Your physician recommends that you return for lab work today: stat troponin, TSH, BMP, magnesium, CBC  If you have labs (blood work) drawn today and your tests are completely normal, you will receive your results only by: Marland Kitchen MyChart Message (if you have MyChart) OR . A paper copy in the mail If you have any lab test that is abnormal or we need to change your treatment, we will call you to review the results.  Testing/Procedures: Please wear ZIO-AT for 2 weeks then return in the mail.   Follow-Up: At [facility], you and your health needs are our priority.  As part of our continuing mission to provide you with exceptional heart care, we have created designated Provider Care Teams.  These Care Teams include your primary Cardiologist (physician) and Advanced Practice Providers (APPs -  Physician Assistants and Nurse Practitioners) who all work together to provide you with the care you need, when you need it.  We recommend signing up for the patient portal called "MyChart".  Sign up information is provided on this After Visit Summary.  MyChart is used to connect with patients for Virtual Visits (Telemedicine).  Patients are able to view lab/test results, encounter notes, upcoming appointments, etc.  Non-urgent messages can be sent to your provider as well.   To learn more about what you can do with MyChart, go to [email or web address].    Your next appointment:   4 week(s)  The format for your next appointment:   In Person  Provider:   You may see Julien Nordmann, MD or one of the following Advanced Practice Providers on your designated Care Team:    Nicolasa Ducking, NP  Eula Listen, PA-C  Marisue Ivan,  PA-C  Gillian Shields, NP  Other Instructions

## 2019-11-14 ENCOUNTER — Encounter: Payer: Self-pay | Admitting: Internal Medicine

## 2019-11-15 ENCOUNTER — Ambulatory Visit (INDEPENDENT_AMBULATORY_CARE_PROVIDER_SITE_OTHER): Payer: Medicare Other | Admitting: Nurse Practitioner

## 2019-11-15 ENCOUNTER — Other Ambulatory Visit: Payer: Self-pay

## 2019-11-15 ENCOUNTER — Ambulatory Visit (INDEPENDENT_AMBULATORY_CARE_PROVIDER_SITE_OTHER): Payer: Medicare Other

## 2019-11-15 ENCOUNTER — Encounter: Payer: Self-pay | Admitting: Nurse Practitioner

## 2019-11-15 ENCOUNTER — Ambulatory Visit: Payer: Medicare Other

## 2019-11-15 VITALS — BP 126/80 | HR 67 | Temp 98.0°F | Ht 65.0 in | Wt 255.0 lb

## 2019-11-15 DIAGNOSIS — Z5900 Homelessness unspecified: Secondary | ICD-10-CM

## 2019-11-15 DIAGNOSIS — F411 Generalized anxiety disorder: Secondary | ICD-10-CM

## 2019-11-15 DIAGNOSIS — F3341 Major depressive disorder, recurrent, in partial remission: Secondary | ICD-10-CM

## 2019-11-15 DIAGNOSIS — M79641 Pain in right hand: Secondary | ICD-10-CM | POA: Insufficient documentation

## 2019-11-15 DIAGNOSIS — S62101A Fracture of unspecified carpal bone, right wrist, initial encounter for closed fracture: Secondary | ICD-10-CM | POA: Insufficient documentation

## 2019-11-15 NOTE — Progress Notes (Signed)
Established Patient Office Visit  Subjective:  Patient ID: Cheyenne Gray, female    DOB: [birth year]  Age: 54 y.o. MRN: [id number]  CC:  Chief Complaint  Patient presents with  . Acute Visit    right hand pain    HPI Cheyenne Gray is a 54 year old patient who comes in for an injured right hand.  Patient states she got angry, and started punching a hard marble surface repeatedly on Saturday.  She thinks she may have fractured her hand.  She applied an ice pack, and has taken aspirin.  The dorsal hand continues to hurt and she is unable to use it normally.  She has pain all across her dorsal hand and slightly into the wrist when she tries to move it.  She cannot hold a Coke can.    Past Medical History:  Diagnosis Date  . (HFpEF) heart failure with preserved ejection fraction ([place])    a. Echo 2014: EF 65-70%, nl WM, mildly dilated LA, PASP nl; b. 12/2014 Echo: EF 65-70%, no rwma, mod septal hypertrophy w/o LVOT gradient or SAM; c. 07/2017 Echo: EF 55-60%, no rwma, mildly dil RV w/ nl syst fxn. Mildly dil RA. Dilated IVC w/ elevated CVP. Triv post effusion.  Marland Kitchen Anxiety   . Arthritis   . Asthma   . B12 deficiency   . Chronic back pain   . Chronic headaches   . Chronic pain    a. on methadone  . Concussion    hx of 4  . Coronary artery disease, non-occlusive    a. LHC 1/18: proximal to mid LAD 40% stenosed, mid LAD 30% stenosed, mid RCA 20% stenosed, distal RCA 20% stenosed, EF 55-65%, LVEDP normal  . Depression   . DJD (degenerative joint disease), multiple sites   . Frequent headaches   . Gallstone   . H/O non-ST elevation myocardial infarction (NSTEMI)   . Hand, foot and mouth disease 2016  . History of shingles   . Hypertension   . Iron deficiency anemia   . Long QT interval   . Methadone use ([place])    managed by Dr. Primus Bravo  . Migraine    1x/mo  . Motion sickness    cars  . Obesity   . Palpitations    a. 24 hour Holter: NSR, sinus brady down to 48, occasional PVCs &  couplets, 8 beats NSVT; b. 30 day event monitor 2015: NSR with rare PVC.  Marland Kitchen Psoriasis   . Syncope and collapse   . Vitamin D deficiency   . Wears dentures    full upper and lower    Past Surgical History:  Procedure Laterality Date  . ABDOMINOPLASTY     tummy tuck ? year   . [place] SURGERY  2001  . CARDIAC CATHETERIZATION Left 04/16/2016   Procedure: Left Heart Cath and Coronary Angiography;  Surgeon: [name], MD;  Location: [facility];  Service: Cardiovascular;  Laterality: Left;  . CHOLECYSTECTOMY  2001  . COLONOSCOPY WITH PROPOFOL N/A 09/28/2018   Procedure: COLONOSCOPY WITH PROPOFOL;  Surgeon: Virgel Manifold, MD;  Location: [facility];  Service: Endoscopy;  Laterality: N/A;  . ESOPHAGOGASTRODUODENOSCOPY (EGD) WITH PROPOFOL N/A 09/28/2018   Procedure: ESOPHAGOGASTRODUODENOSCOPY (EGD) WITH BIOPSY;  Surgeon: Virgel Manifold, MD;  Location: [facility];  Service: Endoscopy;  Laterality: N/A;  . GALLBLADDER SURGERY    . GASTRIC BYPASS  2001  . GASTROPLASTY      Family History  Problem  Relation Age of Onset  . Heart attack Mother   . Cancer Mother        pancreatitic   . Early death Mother   . Heart attack Father 28       MI  . Early death Father   . Heart disease Father   . Heart attack Brother   . Heart disease Brother   . Arthritis Brother   . Depression Brother   . Diabetes Brother   . Heart attack Maternal Grandmother   . Cancer Maternal Grandmother        pancreatitic   . Heart disease Maternal Grandmother   . Cancer Maternal Uncle        pancreatitic   . Cancer Paternal Grandmother        ? type   . Diabetes Paternal Grandmother   . Cancer Maternal Uncle        pancreatitic   . Breast cancer Maternal Aunt     Social History   Socioeconomic History  . Marital status: Widowed    Spouse name: Not on file  . Number of children: 1  . Years of education: assoc degree  . Highest education level: Associate  degree: occupational, Hotel manager, or vocational program  Occupational History  . Not on file  Tobacco Use  . Smoking status: Never Smoker  . Smokeless tobacco: Never Used  Vaping Use  . Vaping Use: Never used  Substance and Sexual Activity  . Alcohol use: No    Alcohol/week: 0.0 standard drinks    Comment: holidays  . Drug use: No  . Sexual activity: Not Currently  Other Topics Concern  . 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 Difficulty of Paying Living Expenses: Not on file  Food Insecurity:   . Worried About Charity fundraiser in the Last Year: Not on file  . Ran Out of Food in the Last Year: Not on file  Transportation Needs:   . Lack of Transportation (Medical): Not on file  . Lack of Transportation (Non-Medical): Not on file  Physical Activity:   . Days of Exercise per Week: Not on file  . Minutes of Exercise per Session: Not on file  Stress:   . Feeling of Stress : Not on file  Social Connections:   . Frequency of Communication with Friends and Family: Not on file  . Frequency of Social Gatherings with Friends and Family: Not on file  . Attends Religious Services: Not on file  . Active Member of Clubs or Organizations: Not on file  . Attends Archivist Meetings: Not on file  . Marital Status: Not on file  Intimate Partner Violence:   . Fear of Current or Ex-Partner: Not on file  . Emotionally Abused: Not on file  . Physically Abused: Not on file  . Sexually Abused: Not on file    Outpatient Medications Prior to Visit  Medication Sig Dispense Refill  . albuterol (VENTOLIN HFA) 108 (90 Base) MCG/ACT inhaler Inhale 1-2 puffs into the lungs as needed for  wheezing. 18 g 11  .  carvedilol (COREG) 6.25 MG tablet Take 1 tablet (6.25 mg total) by mouth 2 (two) times daily. 60 tablet 5  . clotrimazole (CLOTRIMAZOLE AF) 1 % cream Apply 1 application topically 2 (two) times daily. Inner thigh 60 g 12  . diclofenac sodium (VOLTAREN) 1 % GEL Apply 4 g topically 4 (four) times daily. Prn knees and 2 grams qid prn hands arthritis 100 g 11  . EPINEPHrine 0.3 mg/0.3 mL IJ SOAJ injection Inject 0.3 mLs (0.3 mg total) into the muscle as needed for anaphylaxis. 2 each 0  . furosemide (LASIX) 20 MG tablet TAKE 1 TABLET BY MOUTH EVERY DAY AS NEEDED 90 tablet 0  . gabapentin (NEURONTIN) 400 MG capsule Limit 2 tablets in the a.m. and midday and 3 tablets each evening 630 capsule 3  . methadone (DOLOPHINE) 10 MG tablet Limit 1-2 tablets by mouth 2-3 times per day if tolerated 180 tablet 0  . rosuvastatin (CRESTOR) 10 MG tablet Take 1 tablet (10 mg total) by mouth daily. 90 tablet 3  . triamcinolone cream (KENALOG) 0.1 % Apply 1 application topically 2 (two) times daily. 80 g 0  . vitamin B-12 (CYANOCOBALAMIN) 500 MCG tablet Take by mouth.    . isosorbide mononitrate (IMDUR) 60 MG 24 hr tablet Take 1 tablet (60 mg total) by mouth daily. 90 tablet 2  . nitroGLYCERIN (NITROSTAT) 0.4 MG SL tablet Place 1 tablet (0.4 mg total) under the tongue every 5 (five) minutes as needed for chest pain. 25 tablet 3  . rOPINIRole (REQUIP) 0.25 MG tablet Take 2-3 tablets (0.5-0.75 mg total) by mouth at bedtime. (Patient not taking: Reported on 11/15/2019) 90 tablet 11   No facility-administered medications prior to visit.    Allergies  Allergen Reactions  . Covid-19 (Mrna) Vaccine Therapist, music) [Covid-19 (Mrna) Vaccine] Itching, Palpitations and Other (See Comments)    Throat closing.   Marland Kitchen Penicillins Hives, Shortness Of Breath and Rash    Has patient had a PCN reaction causing immediate rash, facial/tongue/throat swelling, SOB or lightheadedness with hypotension: Yes Has patient had a PCN  reaction causing severe rash involving mucus membranes or skin necrosis: No Has patient had a PCN reaction that required hospitalization No Has patient had a PCN reaction occurring within the last 10 years: No If all of the above answers are "NO", then may proceed with Cephalosporin use.   . Bee Venom    Review of Systems  Constitutional: Negative for chills and fever.  Musculoskeletal:       Right hand pain  Psychiatric/Behavioral:       Felt anger on Saturday and has calmed down now. She is very stressed . She is getting evicted on September 15 and has absolutely no place to go.  She has no family who can help her.  She is very stressed and worried about this. Hx of mental health diagnosis and followed actively by Dr. Shea Evans.       Objective:    Physical Exam Vitals reviewed.  HENT:     Head: Normocephalic.  Cardiovascular:     Rate and Rhythm: Normal rate and regular rhythm.     Pulses: Normal pulses.     Heart sounds: Normal heart sounds.  Pulmonary:     Effort: Pulmonary effort is normal.     Breath sounds: Normal breath sounds.  Musculoskeletal:     Comments: The right dorsal hand has very slight swelling, tenderness over the dorsal hand and wrist area, decreased range of motion. Guarding the use of  the hand and wrist.   Skin:    General: Skin is warm and dry.     Comments: She has scabs at various degrees of healing scattered across her scar extremities and trunk.  She reports she has a nervous habit of picking at her skin.  Neurological:     General: No focal deficit present.     Mental Status: She is alert and oriented to person, place, and time.  Psychiatric:     Comments: Anxious.      BP 126/80 (BP Location: Left Arm, Patient Position: Sitting, Cuff Size: Large)   Pulse 67   Temp 98 F (36.7 C) (Oral)   Ht _0  (1.651 m)   Wt 255 lb (115.7 kg)   LMP 02/18/2016 (Approximate) Comment: neg HCG-03/26/2016  SpO2 98%   BMI 42.43 kg/m  Wt Readings from Last 3  Encounters:  11/15/19 255 lb (115.7 kg)  11/09/19 249 lb 6 oz (113.1 kg)  07/26/19 254 lb (115.2 kg)     Health Maintenance Due  Topic Date Due  . TETANUS/TDAP  Never done  . PAP SMEAR-Modifier  Never done  . COVID-19 Vaccine (2 - Pfizer 2-dose series) 07/08/2019  . MAMMOGRAM  09/30/2019  . INFLUENZA VACCINE  Never done    There are no preventive care reminders to display for this patient.  Lab Results  Component Value Date   TSH 3.250 11/09/2019   Lab Results  Component Value Date   WBC 8.8 11/09/2019   HGB 12.7 11/09/2019   HCT 37.6 11/09/2019   MCV 91.0 11/09/2019   PLT 219 11/09/2019   Lab Results  Component Value Date   NA 140 11/09/2019   K 4.0 11/09/2019   CO2 28 11/09/2019   GLUCOSE 98 11/09/2019   BUN <5 (L) 11/09/2019   CREATININE 0.82 11/09/2019   BILITOT 0.4 01/16/2019   ALKPHOS 99 01/16/2019   AST 29 01/16/2019   ALT 15 01/16/2019   PROT 7.9 01/16/2019   ALBUMIN 4.1 01/16/2019   CALCIUM 8.8 (L) 11/09/2019   ANIONGAP 11 11/09/2019   GFR 68.64 09/01/2018   Lab Results  Component Value Date   CHOL 176 09/01/2018   Lab Results  Component Value Date   HDL 57.90 09/01/2018   Lab Results  Component Value Date   LDLCALC 95 09/01/2018   Lab Results  Component Value Date   TRIG 113.0 09/01/2018   Lab Results  Component Value Date   CHOLHDL 3 09/01/2018   Lab Results  Component Value Date   HGBA1C 5.3 09/01/2018      Assessment & Plan:   Problem List Items Addressed This Visit      Musculoskeletal and Integument   Closed fracture of right wrist   Relevant Orders   PR WHO COCK-UP NONMOLDE PRE OTS     Other   GAD (generalized anxiety disorder)   MDD (major depressive disorder), recurrent, in partial remission ([place])   Hand pain, right - Primary   Relevant Orders   DG Hand Complete Right (Completed)   PR WHO COCK-UP NONMOLDE PRE OTS   Homeless single person   Relevant Orders   Ambulatory referral to Connected Care     Wrist  splint, keep hand elevated.  Tylenol for pain. Avoid NSAIDs secondary to Gastric bypass   Recommend Walk in at [facility] today-at 1:30 appt with WRIST SPECIALIST Dr. Peggye Ley  [place], [place] across the [address] from [employer]  on [address]   Urgent referral to Connected Care for help with impending homelessness.   Please make routine appt to see Dr. Olivia Mackie in one month or so. No orders of the defined types were placed in this encounter.   Follow-up: Return in about 4 weeks (around 12/13/2019).  This visit occurred during the SARS-CoV-2 public health emergency.  Safety protocols were in place, including screening questions prior to the visit, additional usage of staff PPE, and extensive cleaning of exam room while observing appropriate contact time as indicated for disinfecting solutions.    Denice Paradise, NP

## 2019-11-15 NOTE — Patient Instructions (Addendum)
Wrist splint, keep hand elevated.  Tylenol for pain. Avoid NSAIDs secondary to Gastric bypass   Recommend Walk in at [facility] today-at 1:30 appt with WRIST SPECIALIST Dr. Stephenie Acres  [address] [place], [place]  Located across the street from [employer]  [facility] on [address]   Urgent referral to Connected Care for help with impending homelessness.   Please make routine appt to see Dr. French Ana in one month or so.    Wrist Fracture Treated With Immobilization  A wrist fracture is a break or crack in one of the bones of the wrist. The wrist is made up of eight small bones at the palm of the hand (carpal bones) and two long bones that make up the forearm (radius and ulna). If the joint is stable and the bones are still in their normal position (nondisplaced), the injury may be treated with immobilization. This involves the use of a cast, splint, or sling to hold the wrist in place. Immobilization ensures that the bones continue to stay in the correct position while the wrist is healing. What are the causes? This condition may be caused by:  A direct force to the wrist.  Falling on an outstretched hand.  Trauma, such as a car accident or a fall. What increases the risk? The following factors may make you more likely to develop this condition:  Doing contact sports or high-risk sports such as skiing, biking, and ice skating.  Taking steroid medicines.  Smoking.  Being female.  Being Caucasian.  Drinking more than three alcoholic beverages per day.  Having a condition that weakens the bones (osteoporosis).  Being older.  Having a history of previous fractures. What are the signs or symptoms? Symptoms of this condition include:  Pain.  Swelling.  Bruising.  Not being able to move the wrist normally. Additionally, the wrist may hang in an odd position or appear deformed. How is this diagnosed? This condition may be diagnosed based on  a physical exam and X-rays. You may also have a CT scan or MRI. How is this treated? Treatment for this condition involves wearing a cast, splint, or sling until the injured area is stable enough for you to begin range-of-motion exercises. You may also be prescribed pain medicine. Follow these instructions at home: If you have a cast:  Do not stick anything inside the cast to scratch your skin. Doing that increases your risk of infection.  Check the skin around the cast every day. Tell your health care provider about any concerns.  You may put lotion on dry skin around the edges of the cast. Do not put lotion on the skin underneath the cast.  Keep the cast clean and dry. If you have a splint or sling:  Wear the splint or sling as told by your health care provider. Remove it only as told by your health care provider.  Loosen the splint or sling if your fingers tingle, become numb, or turn cold and blue.  Keep the splint or sling clean and dry. Bathing  Do not take baths, swim, or use a hot tub until your health care provider approves. Ask your health care provider if you may take showers. You may only be allowed to take sponge baths.  If your cast, splint, or sling is not waterproof: ? Do not let it get wet. ? Cover it with a watertight covering when you take a bath or a shower.  If you have a sling, remove  it for bathing only if your health care provider tells you it is safe to do that. Managing pain, stiffness, and swelling   If directed, put ice on the injured area. ? If you have a removable splint or sling, remove it as told by your health care provider. ? Put ice in a plastic bag. ? Place a towel between your skin and the bag or between your cast and the bag. ? Leave the ice on for 20 minutes, 2-3 times a day.  Move your fingers often to avoid stiffness and to lessen swelling.  Raise (elevate) the injured area above the level of your heart while you are sitting or lying  down. Driving  Do not drive or use heavy machinery while taking prescription pain medicine.  Ask your health care provider when it is safe to drive if you have a cast, splint, or sling on your wrist. Activity  Return to your normal activities as told by your health care provider. Ask your health care provider what activities are safe for you.  Do not lift with your injured wrist until your health care provider approves.  Do range-of-motion exercises only as told by your health care provider or physical therapist. General instructions  Do not put pressure on any part of the cast or splint until it is fully hardened. This may take several hours.  Take over-the-counter and prescription medicines only as told by your health care provider.  Do not use any products that contain nicotine or tobacco, such as cigarettes, e-cigarettes, and chewing tobacco. These can delay bone healing. If you need help quitting, ask your health care provider.  If you were prescribed pain medicine, take steps to prevent or treat constipation. Your health care provider may recommend that you: ? Drink enough fluid to keep your urine pale yellow. ? Take over-the-counter or prescription medicines. ? Eat foods that are high in fiber, such as beans, whole grains, and fresh fruits and vegetables. ? Limit foods that are high in fat and processed sugars, such as fried or sweet foods.  Keep all follow-up visits as told by your health care provider. This is important. Contact a health care provider if:  Your cast, splint, or sling is damaged or loose.  You have any new pain, swelling, or bruising.  Your pain, swelling, and bruising do not improve.  You have a fever.  You have chills. Get help right away if:  Your skin or fingers on your injured arm turn blue or gray.  Your arm feels cold or numb.  You have severe pain in your injured wrist. Summary  A wrist fracture is a break or crack in one of the bones  of the wrist.  If the joint is stable and the bones are still in their normal position, the injury may be treated by wearing a cast, splint, or sling.  If you have a cast, check the skin around the cast every day. Tell your health care provider about any concerns.  If you have a splint or sling, wear it as told by your health care provider. Remove it only as told by your health care provider. This information is not intended to replace advice given to you by your health care provider. Make sure you discuss any questions you have with your health care provider. Document Revised: 08/04/2017 Document Reviewed: 08/04/2017 Elsevier Patient Education  2020 [employer].

## 2019-11-16 ENCOUNTER — Telehealth: Payer: Self-pay | Admitting: Psychiatry

## 2019-11-16 ENCOUNTER — Encounter: Payer: Self-pay | Admitting: Family

## 2019-11-16 ENCOUNTER — Telehealth: Payer: Self-pay | Admitting: Internal Medicine

## 2019-11-16 NOTE — Telephone Encounter (Signed)
Rojelio Brenner 11/16/2019 Called pt regarding community resource referral received. Left message for pt to call me back, my info is [phone] please see ref notes for more details.  Rojelio Brenner Care Guide, Embedded Care Coordination [facility], Care Management

## 2019-11-16 NOTE — Telephone Encounter (Signed)
Called patient since I received a message from her primary care provider that patient was not doing well due to recent homelessness.  Patient today appeared to be cooperative and calm when Clinical research associate called.  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  She reports she is anxious about her being evicted soon.  She reports she is being offered a trailer however she does not want to stay in a 1 room trailer.  She hence is looking into other options.  She reports her daughter is not talking to her and that also is making her more anxious.  Provided supportive therapy.  Provided information for crisis walk-in resources in the community.  Discussed with patient that she can be placed on a wait list, if cancellations to be seen sooner.  She does have upcoming appointment in September and she agrees to keep that.  She will also reach out to writer if she needs medication management sooner.

## 2019-11-16 NOTE — Telephone Encounter (Signed)
error 

## 2019-11-18 ENCOUNTER — Telehealth: Payer: Self-pay

## 2019-11-18 NOTE — Telephone Encounter (Signed)
TY. We will wait for formal report of 1 week monitor. Will await results and discuss at follow up - will defer repeat monitor at this time.   Alver Sorrow, NP

## 2019-11-18 NOTE — Telephone Encounter (Signed)
Continuation of the previous documentation.  [employer] called to provide an update. The patients AT monitor leads are off. They have 1 weeks worth of data. The last communication with the patient is that she was having some skin issues at the application site.  FYI update fwd to the ordering provider Gillian Shields, NP.

## 2019-11-18 NOTE — Telephone Encounter (Signed)
[employer] called to inform that

## 2019-11-18 NOTE — Telephone Encounter (Signed)
Noted.       Closing this encounter

## 2019-11-23 ENCOUNTER — Telehealth: Payer: Self-pay | Admitting: Internal Medicine

## 2019-11-23 NOTE — Telephone Encounter (Signed)
Cheyenne Gray 11/23/2019 Called pt regarding community resource referral received. Left message for pt to call me back, my info is [phone] please see ref notes for more details.  Cheyenne Gray Care Guide, Embedded Care Coordination [place], Care Management    

## 2019-11-24 ENCOUNTER — Telehealth: Payer: Self-pay | Admitting: Internal Medicine

## 2019-11-24 NOTE — Telephone Encounter (Signed)
Cheyenne Gray 11/24/2019 Called pt regarding community resource referral received. Left message for pt to call me back, my info is [phone] please see ref notes for more details.  Cheyenne Gray Care Guide, Embedded Care Coordination [place], Care Management    

## 2019-11-28 ENCOUNTER — Telehealth: Payer: Self-pay | Admitting: Family

## 2019-11-28 DIAGNOSIS — R55 Syncope and collapse: Secondary | ICD-10-CM

## 2019-11-28 DIAGNOSIS — Z79899 Other long term (current) drug therapy: Secondary | ICD-10-CM

## 2019-11-28 DIAGNOSIS — M7989 Other specified soft tissue disorders: Secondary | ICD-10-CM

## 2019-11-28 NOTE — Telephone Encounter (Signed)
Patient states she is still swelling Pt c/o swelling: STAT is pt has developed SOB within 24 hours  1) How much weight have you gained and in what time span? Not sure  2) If swelling, where is the swelling located? Bilateral legs   3) Are you currently taking a fluid pill? yes  4) Are you currently SOB? yes  5) Do you have a log of your daily weights (if so, list)? no  6) Have you gained 3 pounds in a day or 5 pounds in a week? Not sure  7) Have you traveled recently? no  States she has had a headache and has passed out twice.   Pt c/o Syncope: STAT if syncope occurred within 30 minutes and pt complains of lightheadedness High Priority if episode of passing out, completely, today or in last 24 hours   1. Did you pass out today? no  2. When is the last time you passed out? A week  Ago 3.  4. Has this occurred multiple times? yes  5. Did you have any symptoms prior to passing out? lightheaded

## 2019-11-28 NOTE — Telephone Encounter (Signed)
Spoke to patient. Since about 1 weeks ago she's developed major swelling in her thighs to ankles/feet. Started taking furosemide 20 mg daily since then (see MyChart message and advice per Gillian Shields, NP from 11/16/19). Since then she has not seen any real improvement. She's been urinating more. She even took 40 mg total yesterday. She also said she passed out twice. Once at [employer] and then at home.  She is in the process of moving out of her house and is not able to elevate her legs very often. She's scheduled for f/u appointment next week to review monitor results. I checked on [employer] and it is pending return.   I asked her to send in a picture to MyChart and she agreed to do so. Routing to [place] for advice.

## 2019-11-29 NOTE — Telephone Encounter (Signed)
Order for BMET and echo entered. May need to push out follow up appointment to accommodate for echo.  No answer. Left message to call back.

## 2019-11-29 NOTE — Telephone Encounter (Signed)
Recommend she elevate lower extremities when sitting. Compression stockings would also be beneficial. Avoid excess sodium as it contributes to swelling - this includes sodas and Gatorade. Recommend she stay adequately hydrated and eat regular meals to prevent lightheadedness as at recent clinic visit she was noted to be going multiple days without eating.   Can she stop [facility] for [facility] this week? Allows [place] to possibly adju st her diuretic.   Recommend we get her scheduled for echocardiogram due to edema and syncope.   Gillian Shields, NP

## 2019-12-01 NOTE — Telephone Encounter (Signed)
Spoke to patient and she is agreeable to having the BMET drawn as well as echo. Transferred to scheduler for echo appointment.  Patient will plan to go to the [facility] today or tomorrow.  She verbalized understanding of the other recommendations as well.

## 2019-12-07 ENCOUNTER — Encounter: Payer: Self-pay | Admitting: Family

## 2019-12-07 ENCOUNTER — Ambulatory Visit: Payer: Medicare Other | Admitting: Family

## 2019-12-07 ENCOUNTER — Ambulatory Visit (INDEPENDENT_AMBULATORY_CARE_PROVIDER_SITE_OTHER): Payer: Medicare Other | Admitting: Family

## 2019-12-07 ENCOUNTER — Inpatient Hospital Stay: Admit: 2019-12-07 | Payer: Medicare Other

## 2019-12-07 ENCOUNTER — Other Ambulatory Visit: Payer: Self-pay

## 2019-12-07 ENCOUNTER — Other Ambulatory Visit
Admission: RE | Admit: 2019-12-07 | Discharge: 2019-12-07 | Disposition: A | Discharge status: Home / Self Care | Payer: Medicare Other | Source: Ambulatory Visit | Attending: Internal Medicine | Admitting: Internal Medicine

## 2019-12-07 ENCOUNTER — Other Ambulatory Visit
Admission: RE | Admit: 2019-12-07 | Discharge: 2019-12-07 | Disposition: A | Discharge status: Home / Self Care | Payer: Medicare Other | Source: Ambulatory Visit | Attending: Family | Admitting: Family

## 2019-12-07 VITALS — BP 120/80 | HR 67 | Ht 67.0 in | Wt 260.0 lb

## 2019-12-07 DIAGNOSIS — M2550 Pain in unspecified joint: Secondary | ICD-10-CM

## 2019-12-07 DIAGNOSIS — G8929 Other chronic pain: Secondary | ICD-10-CM | POA: Diagnosis present

## 2019-12-07 DIAGNOSIS — Z79899 Other long term (current) drug therapy: Secondary | ICD-10-CM

## 2019-12-07 DIAGNOSIS — R0600 Dyspnea, unspecified: Secondary | ICD-10-CM

## 2019-12-07 DIAGNOSIS — I2510 Atherosclerotic heart disease of native coronary artery without angina pectoris: Secondary | ICD-10-CM

## 2019-12-07 DIAGNOSIS — M25561 Pain in right knee: Secondary | ICD-10-CM | POA: Insufficient documentation

## 2019-12-07 DIAGNOSIS — I493 Ventricular premature depolarization: Secondary | ICD-10-CM | POA: Diagnosis not present

## 2019-12-07 DIAGNOSIS — R600 Localized edema: Secondary | ICD-10-CM

## 2019-12-07 DIAGNOSIS — M79642 Pain in left hand: Secondary | ICD-10-CM | POA: Diagnosis present

## 2019-12-07 DIAGNOSIS — M79641 Pain in right hand: Secondary | ICD-10-CM | POA: Insufficient documentation

## 2019-12-07 DIAGNOSIS — E785 Hyperlipidemia, unspecified: Secondary | ICD-10-CM | POA: Diagnosis not present

## 2019-12-07 DIAGNOSIS — I10 Essential (primary) hypertension: Secondary | ICD-10-CM

## 2019-12-07 DIAGNOSIS — M25562 Pain in left knee: Secondary | ICD-10-CM | POA: Insufficient documentation

## 2019-12-07 DIAGNOSIS — M7989 Other specified soft tissue disorders: Secondary | ICD-10-CM

## 2019-12-07 DIAGNOSIS — E559 Vitamin D deficiency, unspecified: Secondary | ICD-10-CM

## 2019-12-07 DIAGNOSIS — R0609 Other forms of dyspnea: Secondary | ICD-10-CM

## 2019-12-07 DIAGNOSIS — R55 Syncope and collapse: Secondary | ICD-10-CM

## 2019-12-07 DIAGNOSIS — Z1329 Encounter for screening for other suspected endocrine disorder: Secondary | ICD-10-CM

## 2019-12-07 LAB — COMPREHENSIVE METABOLIC PANEL
ALT: 21 U/L (ref 0–44)
AST: 29 U/L (ref 15–41)
Albumin: 3.5 g/dL (ref 3.5–5.0)
Alkaline Phosphatase: 107 U/L (ref 38–126)
Anion gap: 9 (ref 5–15)
BUN: 9 mg/dL (ref 6–20)
CO2: 29 mmol/L (ref 22–32)
Calcium: 8.9 mg/dL (ref 8.9–10.3)
Chloride: 103 mmol/L (ref 98–111)
Creatinine, Ser: 0.84 mg/dL (ref 0.44–1.00)
GFR calc Af Amer: >60 mL/min (ref >60)
GFR calc non Af Amer: >60 mL/min (ref >60)
Glucose, Bld: 91 mg/dL (ref 70–99)
Potassium: 3.6 mmol/L (ref 3.5–5.1)
Sodium: 141 mmol/L (ref 135–145)
Total Bilirubin: 0.7 mg/dL (ref 0.3–1.2)
Total Protein: 6.7 g/dL (ref 6.5–8.1)

## 2019-12-07 LAB — TSH: TSH: 3.915 u[IU]/mL (ref 0.350–4.500)

## 2019-12-07 LAB — URINALYSIS, ROUTINE W REFLEX MICROSCOPIC
Bilirubin Urine: NEGATIVE
Glucose, UA: NEGATIVE mg/dL
Ketones, ur: NEGATIVE mg/dL
Nitrite: NEGATIVE
Protein, ur: NEGATIVE mg/dL
Specific Gravity, Urine: 1.01 (ref 1.005–1.030)
pH: 6 (ref 5.0–8.0)

## 2019-12-07 LAB — LIPID PANEL
Cholesterol: 114 mg/dL (ref 0–200)
HDL: 54 mg/dL (ref >40)
LDL Cholesterol: 47 mg/dL (ref 0–99)
Total CHOL/HDL Ratio: 2.1 RATIO
Triglycerides: 63 mg/dL (ref <150)
VLDL: 13 mg/dL (ref 0–40)

## 2019-12-07 LAB — SEDIMENTATION RATE: Sed Rate: 28 mm/hr (ref 0–30)

## 2019-12-07 LAB — VITAMIN D 25 HYDROXY (VIT D DEFICIENCY, FRACTURES): Vit D, 25-Hydroxy: 30.87 ng/mL (ref 30–100)

## 2019-12-07 LAB — C-REACTIVE PROTEIN: CRP: 0.6 mg/dL (ref <1.0)

## 2019-12-07 MED ORDER — POTASSIUM CHLORIDE ER 10 MEQ PO TBCR: 10.0000 meq | EXTENDED_RELEASE_TABLET | Freq: Every day | ORAL | 2 refills | Status: DC

## 2019-12-07 MED ORDER — FUROSEMIDE 20 MG PO TABS
20.0000 mg | ORAL_TABLET | Freq: Every day | ORAL | 2 refills | Status: DC
Start: 2019-12-07 — End: 2020-04-18

## 2019-12-07 MED ORDER — CARVEDILOL 12.5 MG PO TABS: 12.5000 mg | ORAL_TABLET | Freq: Two times a day (BID) | ORAL | 5 refills | Status: DC

## 2019-12-07 NOTE — Progress Notes (Signed)
Office Visit    Patient Name: Cheyenne Gray Date of Encounter: 12/07/2019  Primary Care Provider:  McLean-Scocuzza, Pasty Spillers, MD Primary Cardiologist:  Julien Nordmann, MD Electrophysiologist:  None   Chief Complaint    Cheyenne Gray is a 54 y.o. female with a hx of palpitations with findings of PVC on monitor, tachycardia, syncope, chest pain with nonobstructive CAD by catheterization early 2018, aortic atherosclerosis, HLD, obesity s/p bariatric surgery, long QT syndrome, chronic pain on methadone, PTSD presents today for follow up after ZIO.  Past Medical History    Past Medical History:  Diagnosis Date  . (HFpEF) heart failure with preserved ejection fraction (HCC)    a. Echo 2014: EF 65-70%, nl WM, mildly dilated LA, PASP nl; b. 12/2014 Echo: EF 65-70%, no rwma, mod septal hypertrophy w/o LVOT gradient or SAM; c. 07/2017 Echo: EF 55-60%, no rwma, mildly dil RV w/ nl syst fxn. Mildly dil RA. Dilated IVC w/ elevated CVP. Triv post effusion.  Marland Kitchen Anxiety   . Arthritis   . Asthma   . B12 deficiency   . Chronic back pain   . Chronic headaches   . Chronic pain    a. on methadone  . Concussion    hx of 4  . Coronary artery disease, non-occlusive    a. LHC 1/18: proximal to mid LAD 40% stenosed, mid LAD 30% stenosed, mid RCA 20% stenosed, distal RCA 20% stenosed, EF 55-65%, LVEDP normal  . Depression   . DJD (degenerative joint disease), multiple sites   . Frequent headaches   . Gallstone   . H/O non-ST elevation myocardial infarction (NSTEMI)   . Hand, foot and mouth disease 2016  . History of shingles   . Hypertension   . Iron deficiency anemia   . Long QT interval   . Methadone use (HCC)    managed by Dr. Metta Clines  . Migraine    1x/mo  . Motion sickness    cars  . Obesity   . Palpitations    a. 24 hour Holter: NSR, sinus brady down to 48, occasional PVCs & couplets, 8 beats NSVT; b. 30 day event monitor 2015: NSR with rare PVC.  Marland Kitchen Psoriasis   . Syncope and collapse    . Vitamin D deficiency   . Wears dentures    full upper and lower   Past Surgical History:  Procedure Laterality Date  . ABDOMINOPLASTY     tummy tuck ? year   . BARIATRIC SURGERY  2001  . CARDIAC CATHETERIZATION Left 04/16/2016   Procedure: Left Heart Cath and Coronary Angiography;  Surgeon: Antonieta Iba, MD;  Location: [facility];  Service: Cardiovascular;  Laterality: Left;  . CHOLECYSTECTOMY  2001  . COLONOSCOPY WITH PROPOFOL N/A 09/28/2018   Procedure: COLONOSCOPY WITH PROPOFOL;  Surgeon: Pasty Spillers, MD;  Location: [facility];  Service: Endoscopy;  Laterality: N/A;  . ESOPHAGOGASTRODUODENOSCOPY (EGD) WITH PROPOFOL N/A 09/28/2018   Procedure: ESOPHAGOGASTRODUODENOSCOPY (EGD) WITH BIOPSY;  Surgeon: Pasty Spillers, MD;  Location: [facility];  Service: Endoscopy;  Laterality: N/A;  . GALLBLADDER SURGERY    . GASTRIC BYPASS  2001  . GASTROPLASTY      Allergies  Allergies  Allergen Reactions  . Covid-19 (Mrna) Vaccine Proofreader) [Covid-19 (Mrna) Vaccine] Itching, Palpitations and Other (See Comments)    Throat closing.   Marland Kitchen Penicillins Hives, Shortness Of Breath and Rash    Has patient had a PCN reaction causing immediate rash, facial/tongue/throat  swelling, SOB or lightheadedness with hypotension: Yes Has patient had a PCN reaction causing severe rash involving mucus membranes or skin necrosis: No Has patient had a PCN reaction that required hospitalization No Has patient had a PCN reaction occurring within the last 10 years: No If all of the above answers are "NO", then may proceed with Cephalosporin use.   Alphonsus Sias. Bee Venom     History of Present Illness    Cheyenne Gray is a 54 y.o. female with a hx of palpitations with findings of PVC on monitor, tachycardia, syncope, chest pain with nonobstructive CAD by catheterization early 2018, aortic atherosclerosis, HLD, obesity s/p bariatric surgery, long QT syndrome, chronic pain on  methadone, PTSD. She was last seen 11/09/19.  Previous event monitor in 2015 with predominantly normal sinus rhythm and rare PVC.  Prior echocardiogram October 2016 with normal LVEF.  Lexiscan Myoview 01/2017 low risk scan with small to moderate sized region of predominantly fixed infarct in mid to distal anteroseptal, anteroseptal apical wall with mild peri-infarct ischemia, EF 51%. Echo May 2019 with normal EF.  Performed in the setting of lower extremity swelling and weight gain.  Noted improvement in lower extremity swelling with 15 pound weight loss following initiation of low-dose Lasix.  Her Crestor was previously had to be discontinued due to elevated liver enzymes, but was able to be resumed.  At last clinic visit she noted mild lower extremity edema.  She was recommended for Lasix as needed.  She was also recommended to reduce intake of soda and Gatorade.  Called the office 10/31/2019 with report of syncope while in [employer].  She also noted a 2-week history of headache and multiple hot flashes.  She also noted pain in her left chest radiating to her arm associated with shortness of breath.  She declined ED evaluation.  Clinic visit 11/09/19 for follow up after syncope. Noted dramatic increase in frequency of her hot flashes over the last two months. Also with  [facility] and one in her kitchen. No prodromal symptoms. Reported loss of consciousness was witnessed by family member and lasted only a few seconds. Was noted to go long standing periods of time (multiple days) without eating and only drinking 5-7 cans of Coke per day. Also noted stress with her housing situation. She was recommended for ZIO monitor. She also noted left arm/chest pain at rest and EKG showed NSR 66bpm, frequent PVC but no acute ST/T wave changes and stat troponin was negative. She was started on Coreg 6.25mg  BID due to frequent PVCs on EKG.   MyChart message 11/16/19 recommended to take her Lasix 20mg   daily for 3 days to help with lower extremity edema.  She called the office 11/28/19 noting bilateral lower extremity edema for one week despite Lasix 20mg  daily. She was recommend to elevated her legs, get echocardiogram, and repeat BMP.  Preliminary report of ZIO monitor reviewed shows predominantly NSR. 2 short bursts of VT (longest 10 beats). 4 runs of SVT which were not triggered events. She had a PVC burden of 13.7% and her triggered events were associated with NSR and PVCs. No atrial fibrillation nor significant pause. Await upload to Epic and review by MD. Reviewed preliminary results in clinic.   Reports no recurrent near syncope nor syncope. Has been eating regular meals. Endorses lower extremity edema and dyspnea on exertion. Weight gain of 11lbs since last seen 1 month ago. She has been taking Lasix 20mg  daily. No orthopnea, PND.  Reports infrequent palpitations. Notes fatigue. Tells me she feels she wears out too quickly.   EKGs/Labs/Other Studies Reviewed:   The following studies were reviewed today: EKG:  No EKG is ordered today.  The ekg independently reviewed from 11/09/19 showed SR 66 bpm with frequent PVCs, LA enlargement and no acute ST/T wave changes.   Recent Labs: 11/09/2019: Hemoglobin 12.7; Magnesium 2.0; Platelets 219 12/07/2019: ALT 21; BUN 9; Creatinine, Ser 0.84; Potassium 3.6; Sodium 141; TSH 3.915  Recent Lipid Panel    Component Value Date/Time   CHOL 114 12/07/2019 1106   CHOL 114 10/02/2016 1037   CHOL 147 05/04/2013 1556   TRIG 63 12/07/2019 1106   TRIG 79 05/04/2013 1556   HDL 54 12/07/2019 1106   HDL 55 10/02/2016 1037   HDL 58 05/04/2013 1556   CHOLHDL 2.1 12/07/2019 1106   VLDL 13 12/07/2019 1106   VLDL 16 05/04/2013 1556   LDLCALC 47 12/07/2019 1106   LDLCALC 47 10/02/2016 1037   LDLCALC 73 05/04/2013 1556   Home Medications   Current Meds  Medication Sig  . albuterol (VENTOLIN HFA) 108 (90 Base) MCG/ACT inhaler Inhale 1-2 puffs into the lungs as  needed for wheezing.  . carvedilol (COREG) 12.5 MG tablet Take 1 tablet (12.5 mg total) by mouth 2 (two) times daily.  . clotrimazole (CLOTRIMAZOLE AF) 1 % cream Apply 1 application topically 2 (two) times daily. Inner thigh  . diclofenac sodium (VOLTAREN) 1 % GEL Apply 4 g topically 4 (four) times daily. Prn knees and 2 grams qid prn hands arthritis  . EPINEPHrine 0.3 mg/0.3 mL IJ SOAJ injection Inject 0.3 mLs (0.3 mg total) into the muscle as needed for anaphylaxis.  . furosemide (LASIX) 20 MG tablet Take 1 tablet (20 mg total) by mouth daily.  Marland Kitchen gabapentin (NEURONTIN) 400 MG capsule Limit 2 tablets in the a.m. and midday and 3 tablets each evening  . methadone (DOLOPHINE) 10 MG tablet Limit 1-2 tablets by mouth 2-3 times per day if tolerated  . rosuvastatin (CRESTOR) 10 MG tablet Take 1 tablet (10 mg total) by mouth daily.  Marland Kitchen triamcinolone cream (KENALOG) 0.1 % Apply 1 application topically 2 (two) times daily.  . vitamin B-12 (CYANOCOBALAMIN) 500 MCG tablet Take by mouth.  . [DISCONTINUED] carvedilol (COREG) 6.25 MG tablet Take 1 tablet (6.25 mg total) by mouth 2 (two) times daily.  . [DISCONTINUED] furosemide (LASIX) 20 MG tablet TAKE 1 TABLET BY MOUTH EVERY DAY AS NEEDED    Review of Systems     Review of Systems  Constitutional: Negative for chills, fever and malaise/fatigue.  Cardiovascular: Positive for dyspnea on exertion and leg swelling. Negative for chest pain, near-syncope, orthopnea, palpitations and syncope.  Respiratory: Negative for cough, shortness of breath and wheezing.   Gastrointestinal: Negative for nausea and vomiting.  Neurological: Negative for dizziness, light-headedness and weakness.   All other systems reviewed and are otherwise negative except as noted above.  Physical Exam   VS:  BP 120/80 (BP Location: Left Arm, Patient Position: Sitting, Cuff Size: Large)   Pulse 67   Ht 5\' 7"  (1.702 m)   Wt 260 lb (117.9 kg)   LMP 02/18/2016 (Approximate) Comment: neg  HCG-03/26/2016  SpO2 95%   BMI 40.72 kg/m  , BMI Body mass index is 40.72 kg/m. GEN: Well nourished, overweight, well developed, in no acute distress. HEENT: normal. Neck: Supple, no JVD, carotid bruits, or masses. Cardiac: RRR, no murmurs, rubs, or gallops. No clubbing, cyanosis, edema.  Radials/PT  2+ and equal bilaterally.  Respiratory:  Respirations regular and unlabored, clear to auscultation bilaterally. GI: Soft, nontender, nondistended. MS: No deformity or atrophy. Skin: Warm and dry, no rash. Neuro:  Strength and sensation are intact. Psych: Normal affect.  Assessment & Plan   1. Syncope - 2 episodes of syncope.  Loss of conscious for a few seconds witnessed by family member. No recurrent syncope since last office visit. She has been eating regular meals and anticipate her previous episode was due to skipping meals for 3 days and drinking only Diet Coke. Preliminary ZIO monitor with 2 runs VT, 4 runs SVT, 13.7% PVC burden and no atrial fibrillation nor pause. Await formal result by MD.  Echocardiogram upcoming.   2. CAD/aortic atherosclerosis - No chest pain, pressure, tightness. Does endorse dyspnea on exertion though in setting of 11lb weight gain likely due to fluid retention. Recent EKG and HS-troponin unremarkable. She will continue long-acting nitrate, statin, and Coreg. Increase Coreg to 12.5mg  twice daily, due to PVCs.  Upcoming echocardiogram. If echocardiogram unremarkable, consider Lexiscan.   3. HLD, LDL goal <70 - 08/2018 LDL 95. LDL goal <70. Continue Rosuvastatin 10mg  daily. Not fasting today, defer lab draw to follow up.   4. PVC -frequent PVC noted on ZIO with burden 13.7%. increase Coreg to 12.5mg  twice daily. Reports infrequent palpitations. .She previously had intolerance to metoprolol with diarrhea number of years ago.   5. Lower extremity edema - Increased LE edema over last 3 weeks with weight gain of 11 lbs. Concern for new onset HF, she has echo already  scheduled.  Increase Lasix to 40mg  for 5 days. Then Lasix 20mg  daily. Start Potassium daily. BMP in 2 weeks.  Disposition: Echo 12/27/19 with BMP on that date. Follow up in 4 week(s) with Dr. or APP.   , NP 12/07/2019, 4:48 PM

## 2019-12-07 NOTE — Addendum Note (Signed)
Addended by: Adarrius Graeff on: 12/07/2019 10:58 AM   Modules accepted: Orders  

## 2019-12-07 NOTE — Addendum Note (Signed)
Addended by: Deloria Lair on: 12/07/2019 10:58 AM   Modules accepted: Orders

## 2019-12-07 NOTE — Addendum Note (Signed)
Addended by: Deloria Lair on: 12/07/2019 10:57 AM   Modules accepted: Orders

## 2019-12-07 NOTE — Addendum Note (Signed)
Addended by: Deloria Lair on: 12/07/2019 10:59 AM   Modules accepted: Orders

## 2019-12-07 NOTE — Patient Instructions (Addendum)
Medication Instructions:  Your physician has recommended you make the following change in your medication:   INCREASE Carvedilol to 12.5mg  twice daily  CHANGE Furosemide (Lasix) to 40mg  once daily for 5 days  THEN return to Furosemide (Lasix) to 20mg  daily  START Potassium daily  *If you need a refill on your cardiac medications before your next appointment, please call your pharmacy*   Lab Work: Your physician recommends that you return for lab work in 2 weeks for BMP at  If you have labs (blood work) drawn today and your tests are completely normal, you will receive your results only by: MyChart Message (if you have MyChart) OR . A paper copy in the mail If you have any lab test that is abnormal or we need to change your treatment, we will call you to review the results.   Testing/Procedures: Echocardiogram as scheduled.    Follow-Up: At [facility], you and your health needs are our priority.  As part of our continuing mission to provide you with exceptional heart care, we have created designated Provider Care Teams.  These Care Teams include your primary Cardiologist (physician) and Advanced Practice Providers (APPs -  Physician Assistants and Nurse Practitioners) who all work together to provide you with the care you need, when you need it.  We recommend signing up for the patient portal called "MyChart".  Sign up information is provided on this After Visit Summary.  MyChart is used to connect with patients for Virtual Visits (Telemedicine).  Patients are able to view lab/test results, encounter notes, upcoming appointments, etc.  Non-urgent messages can be sent to your provider as well.   To learn more about what you can do with MyChart, go to Marland Kitchen.    Your next appointment:   In 4 weeks  The format for your next appointment:   In Person  Provider:   You may see [facility], MD or one of the following Advanced Practice  Providers on your designated Care Team:   [email or web address], NP  Julien Nordmann, PA-C  Nicolasa Ducking, NP  Eula Listen, PA-C  Cadence Gillian Shields, Marisue Ivan  Other Instructions

## 2019-12-07 NOTE — Addendum Note (Signed)
Addended by: Deasiah Hagberg on: 12/07/2019 10:59 AM   Modules accepted: Orders  

## 2019-12-07 NOTE — Addendum Note (Signed)
Addended by: Jaylen Claude on: 12/07/2019 10:58 AM   Modules accepted: Orders  

## 2019-12-07 NOTE — Addendum Note (Signed)
Addended by: Litzy Dicker on: 12/07/2019 10:57 AM   Modules accepted: Orders  

## 2019-12-07 NOTE — Addendum Note (Signed)
Addended by: Durwood Dittus on: 12/07/2019 10:58 AM   Modules accepted: Orders  

## 2019-12-07 NOTE — Addendum Note (Signed)
Addended by: TAYLOR, JOYCE on: 12/07/2019 10:57 AM   Modules accepted: Orders  

## 2019-12-08 ENCOUNTER — Telehealth (INDEPENDENT_AMBULATORY_CARE_PROVIDER_SITE_OTHER): Payer: Medicare Other | Admitting: Psychiatry

## 2019-12-08 ENCOUNTER — Encounter: Payer: Self-pay | Admitting: Psychiatry

## 2019-12-08 DIAGNOSIS — F4310 Post-traumatic stress disorder, unspecified: Secondary | ICD-10-CM | POA: Diagnosis not present

## 2019-12-08 DIAGNOSIS — F5105 Insomnia due to other mental disorder: Secondary | ICD-10-CM

## 2019-12-08 DIAGNOSIS — F411 Generalized anxiety disorder: Secondary | ICD-10-CM | POA: Diagnosis not present

## 2019-12-08 DIAGNOSIS — F331 Major depressive disorder, recurrent, moderate: Secondary | ICD-10-CM

## 2019-12-08 DIAGNOSIS — F1590 Other stimulant use, unspecified, uncomplicated: Secondary | ICD-10-CM

## 2019-12-08 LAB — ANA: Anti Nuclear Antibody (ANA): POSITIVE — AB

## 2019-12-08 LAB — RHEUMATOID FACTOR: Rheumatoid fact SerPl-aCnc: <10 IU/mL (ref 0.0–13.9)

## 2019-12-08 MED ORDER — DESVENLAFAXINE SUCCINATE ER 50 MG PO TB24: 50.0000 mg | ORAL_TABLET | Freq: Every day | ORAL | 1 refills | Status: DC

## 2019-12-08 NOTE — Progress Notes (Signed)
Provider Location : [facility] Patient Location : Home  Participants: Patient , Provider  Virtual Visit via Telephone Note  I connected with Cheyenne Gray on 12/08/19 at 10:00 AM EDT by telephone and verified that I am speaking with the correct person using two identifiers.   I discussed the limitations, risks, security and privacy concerns of performing an evaluation and management service by telephone and the availability of in person appointments. I also discussed with the patient that there may be a patient responsible charge related to this service. The patient expressed understanding and agreed to proceed.     I discussed the assessment and treatment plan with the patient. The patient was provided an opportunity to ask questions and all were answered. The patient agreed with the plan and demonstrated an understanding of the instructions.   The patient was advised to call back or seek an in-person evaluation if the symptoms worsen or if the condition fails to improve as anticipated.   [place] MD OP Progress Note  12/08/2019 10:32 AM Cheyenne Gray  MRN:  [id number]  Chief Complaint:  Chief Complaint    Follow-up     HPI: Cheyenne Gray is a 54 year old Caucasian female, widowed, on disability, has a history of PTSD, MDD, GAD, insomnia, caffeine use disorder, history of prolonged QT, chronic back pain on methadone, vitamin B12 deficiency, heart failure, iron deficiency, vitamin D deficiency, history of gastric bypass, MI, hypertension was evaluated by phone today.  Patient today reports she is currently making some progress on the Pristiq.  She denies side effects.  She reports she is compliant.  She reports sleep is overall okay.  She takes the Belsomra on and off.  She reports she is also currently under the care of cardiology for history of palpitations with findings of PVC ,and is scheduled for further follow-up.  Patient denies any suicidality, homicidality or perceptual  disturbances.  She does report psychosocial stressors of possibly facing eviction, relationship struggles.  She reports she is ready to start psychotherapy sessions and is willing to schedule an appointment.  Patient denies any other concerns today.  Visit Diagnosis:    ICD-10-CM   1. PTSD (post-traumatic stress disorder)  F43.10 desvenlafaxine (PRISTIQ) 50 MG 24 hr tablet  2. GAD (generalized anxiety disorder)  F41.1 desvenlafaxine (PRISTIQ) 50 MG 24 hr tablet  3. MDD (major depressive disorder), recurrent episode, moderate (HCC)  F33.1 desvenlafaxine (PRISTIQ) 50 MG 24 hr tablet  4. Insomnia due to mental condition  F51.05   5. Caffeine use disorder  F15.90     Past Psychiatric History: I have reviewed past psychiatric history from my progress note on 10/17/2017.  Past trials of Paxil, Cymbalta, Valium, melatonin, trazodone, Ambien, Wellbutrin  Past Medical History:  Past Medical History:  Diagnosis Date   (HFpEF) heart failure with preserved ejection fraction ([place])    a. Echo 2014: EF 65-70%, nl WM, mildly dilated LA, PASP nl; b. 12/2014 Echo: EF 65-70%, no rwma, mod septal hypertrophy w/o LVOT gradient or SAM; c. 07/2017 Echo: EF 55-60%, no rwma, mildly dil RV w/ nl syst fxn. Mildly dil RA. Dilated IVC w/ elevated CVP. Triv post effusion.   Anxiety    Arthritis    Asthma    B12 deficiency    Chronic back pain    Chronic headaches    Chronic pain    a. on methadone   Concussion    hx of 4   Coronary artery disease, non-occlusive    a.  LHC 1/18: proximal to mid LAD 40% stenosed, mid LAD 30% stenosed, mid RCA 20% stenosed, distal RCA 20% stenosed, EF 55-65%, LVEDP normal   Depression    DJD (degenerative joint disease), multiple sites    Frequent headaches    Gallstone    H/O non-ST elevation myocardial infarction (NSTEMI)    Hand, foot and mouth disease 2016   History of shingles    Hypertension    Iron deficiency anemia    Long QT interval    Methadone  use ([place])    managed by Dr. Primus Bravo   Migraine    1x/mo   Motion sickness    cars   Obesity    Palpitations    a. 24 hour Holter: NSR, sinus brady down to 48, occasional PVCs & couplets, 8 beats NSVT; b. 30 day event monitor 2015: NSR with rare PVC.   Psoriasis    Syncope and collapse    Vitamin D deficiency    Wears dentures    full upper and lower    Past Surgical History:  Procedure Laterality Date   ABDOMINOPLASTY     tummy tuck ? year    BARIATRIC SURGERY  2001   CARDIAC CATHETERIZATION Left 04/16/2016   Procedure: Left Heart Cath and Coronary Angiography;  Surgeon: [name], MD;  Location: [facility];  Service: Cardiovascular;  Laterality: Left;   CHOLECYSTECTOMY  2001   COLONOSCOPY WITH PROPOFOL N/A 09/28/2018   Procedure: COLONOSCOPY WITH PROPOFOL;  Surgeon: Virgel Manifold, MD;  Location: [facility];  Service: Endoscopy;  Laterality: N/A;   ESOPHAGOGASTRODUODENOSCOPY (EGD) WITH PROPOFOL N/A 09/28/2018   Procedure: ESOPHAGOGASTRODUODENOSCOPY (EGD) WITH BIOPSY;  Surgeon: Virgel Manifold, MD;  Location: [facility];  Service: Endoscopy;  Laterality: N/A;   GALLBLADDER SURGERY     GASTRIC BYPASS  2001   GASTROPLASTY      Family Psychiatric History: I have reviewed family psychiatric history from my progress note on 11/06/2017.  Family History:  Family History  Problem Relation Age of Onset   Heart attack Mother    Cancer Mother        pancreatitic    Early death Mother    Heart attack Father 31       MI   Early death Father    Heart disease Father    Heart attack Brother    Heart disease Brother    Arthritis Brother    Depression Brother    Diabetes Brother    Heart attack Maternal Grandmother    Cancer Maternal Grandmother        pancreatitic    Heart disease Maternal Grandmother    Cancer Maternal Uncle        pancreatitic    Cancer Paternal Grandmother        ? type    Diabetes  Paternal Grandmother    Cancer Maternal Uncle        pancreatitic    Breast cancer Maternal Aunt     Social History: I have reviewed social history from my progress note on 11/06/2017. Social History   Socioeconomic History   Marital status: Widowed    Spouse name: Not on file   Number of children: 1   Years of education: assoc degree   Highest education level: Associate degree: occupational, Hotel manager, or vocational program  Occupational History   Not on file  Tobacco Use   Smoking status: Never Smoker   Smokeless tobacco: Never Used  Vaping Use  Vaping Use: Never used  Substance and Sexual Activity   Alcohol use: No    Alcohol/week: 0.0 standard drinks    Comment: holidays   Drug use: No   Sexual activity: Not Currently  Other Topics Concern   Not on file  Social History Narrative   Adopted daughter [name] [phone] (now lives in [place] going to [place] state has apt there)   Significant other mac [phone], former husband died    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    Never smoker    No guns   Wears seat belt    No caffeine   Social Determinants of Radio broadcast assistant Strain:    Difficulty of Paying Living Expenses: Not on file  Food Insecurity:    Worried About Charity fundraiser in the Last Year: Not on file   [employer] of Food in the Last Year: Not on file  Transportation Needs:    Lack of Transportation (Medical): Not on file   Lack of Transportation (Non-Medical): Not on file  Physical Activity:    Days of Exercise per Week: Not on file   Minutes of Exercise per Session: Not on file  Stress:    Feeling of Stress : Not on file  Social Connections:    Frequency of Communication with Friends and Family: Not on file   Frequency of Social Gatherings with Friends and Family: Not on file   Attends Religious Services: Not on file   Active Member of Clubs or Organizations: Not on file   Attends Archivist Meetings: Not on file    Marital Status: Not on file    Allergies:  Allergies  Allergen Reactions   Covid-19 (Mrna) Vaccine Therapist, music) [Covid-19 (Mrna) Vaccine] Itching, Palpitations and Other (See Comments)    Throat closing.    Penicillins Hives, Shortness Of Breath and Rash    Has patient had a PCN reaction causing immediate rash, facial/tongue/throat swelling, SOB or lightheadedness with hypotension: Yes Has patient had a PCN reaction causing severe rash involving mucus membranes or skin necrosis: No Has patient had a PCN reaction that required hospitalization No Has patient had a PCN reaction occurring within the last 10 years: No If all of the above answers are "NO", then may proceed with Cephalosporin use.    Bee Venom     Metabolic Disorder Labs: Lab Results  Component Value Date   HGBA1C 5.3 09/01/2018   No results found for: PROLACTIN Lab Results  Component Value Date   CHOL 114 12/07/2019   TRIG 63 12/07/2019   HDL 54 12/07/2019   CHOLHDL 2.1 12/07/2019   VLDL 13 12/07/2019   LDLCALC 47 12/07/2019   LDLCALC 95 09/01/2018   Lab Results  Component Value Date   TSH 3.915 12/07/2019   TSH 3.250 11/09/2019    Therapeutic Level Labs: No results found for: LITHIUM No results found for: VALPROATE No components found for:  CBMZ  Current Medications: Current Outpatient Medications  Medication Sig Dispense Refill   albuterol (VENTOLIN HFA) 108 (90 Base) MCG/ACT inhaler Inhale 1-2 puffs into the lungs as needed for wheezing. 18 g 11   carvedilol (COREG) 12.5 MG tablet Take 1 tablet (12.5 mg total) by mouth 2 (two) times daily. 30 tablet 5   clotrimazole (CLOTRIMAZOLE AF) 1 % cream Apply 1 application topically 2 (two) times daily. Inner thigh 60 g 12   desvenlafaxine (PRISTIQ) 50 MG 24 hr tablet Take 1 tablet (50 mg total)  by mouth daily. 30 tablet 1   diclofenac sodium (VOLTAREN) 1 % GEL Apply 4 g topically 4 (four) times daily. Prn knees and 2 grams qid prn hands arthritis 100 g  11   EPINEPHrine 0.3 mg/0.3 mL IJ SOAJ injection Inject 0.3 mLs (0.3 mg total) into the muscle as needed for anaphylaxis. 2 each 0   furosemide (LASIX) 20 MG tablet Take 1 tablet (20 mg total) by mouth daily. 35 tablet 2   gabapentin (NEURONTIN) 400 MG capsule Limit 2 tablets in the a.m. and midday and 3 tablets each evening 630 capsule 3   isosorbide mononitrate (IMDUR) 60 MG 24 hr tablet Take 1 tablet (60 mg total) by mouth daily. 90 tablet 2   methadone (DOLOPHINE) 10 MG tablet Limit 1-2 tablets by mouth 2-3 times per day if tolerated 180 tablet 0   nitroGLYCERIN (NITROSTAT) 0.4 MG SL tablet Place 1 tablet (0.4 mg total) under the tongue every 5 (five) minutes as needed for chest pain. 25 tablet 3   potassium chloride (KLOR-CON) 10 MEQ tablet Take 1 tablet (10 mEq total) by mouth daily. 30 tablet 2   rosuvastatin (CRESTOR) 10 MG tablet Take 1 tablet (10 mg total) by mouth daily. 90 tablet 3   triamcinolone cream (KENALOG) 0.1 % Apply 1 application topically 2 (two) times daily. 80 g 0   vitamin B-12 (CYANOCOBALAMIN) 500 MCG tablet Take by mouth.     No current facility-administered medications for this visit.     Musculoskeletal: Strength & Muscle Tone: UTA Gait & Station: UTA Patient leans: N/A  Psychiatric Specialty Exam: Review of Systems  Psychiatric/Behavioral: Positive for dysphoric mood and sleep disturbance.  All other systems reviewed and are negative.   Last menstrual period 02/18/2016.There is no height or weight on file to calculate BMI.  General Appearance: UTA  Eye Contact:  UTA  Speech:  Clear and Coherent  Volume:  Normal  Mood:  Depressed  Affect:  UTA  Thought Process:  Goal Directed and Descriptions of Associations: Intact  Orientation:  Full (Time, Place, and Person)  Thought Content: Logical   Suicidal Thoughts:  No  Homicidal Thoughts:  No  Memory:  Immediate;   Fair Recent;   Fair Remote;   Fair  Judgement:  Fair  Insight:  Fair   Psychomotor Activity:  UTA  Concentration:  Concentration: Fair and Attention Span: Fair  Recall:  [employer] of Knowledge: Fair  Language: Fair  Akathisia:  No  Handed:  Right  AIMS (if indicated): UTA  Assets:  Communication Skills Desire for Improvement Housing Social Support  ADL's:  Intact  Cognition: WNL  Sleep:  Improving   Screenings: GAD-7     Office Visit from 07/26/2019 in [facility] Office Visit from 09/21/2015 in [facility]  Total GAD-7 Score 15 4    Mini-Mental     Office Visit from 10/30/2017 in [facility]  Total Score (max 30 points ) 27    PHQ2-9     Office Visit from 07/26/2019 in [facility] Office Visit from 12/21/2018 in [facility] Office Visit from 08/18/2017 in [facility] Office Visit from 11/15/2015 in [facility] Office Visit from 10/18/2015 in [facility]  PHQ-2 Total Score 5 0 0 0 0  PHQ-9 Total Score 19 -- -- -- --       Assessment and  Plan: KAZZANDRA DESAULNIERS is a 54 year old Caucasian female, widowed, has a history of PTSD, depression, anxiety, multiple medical problems including coronary artery disease, prolonged QT per history, PVC, gastric bypass, vitamin B12 deficiency, vitamin D deficiency, chronic pain on methadone was evaluated by phone today.  Patient is biologically predisposed given her multiple health problems including pain, history of trauma, family history of mental health problems.  Patient with psychosocial stressors of housing issues, relationship struggles will continue to benefit from medication readjustment and referral for psychotherapy sessions.  Plan as noted below.  Plan PTSD-stable Continue medications.  MDD-improving Increase Pristiq to 50 mg p.o. qhs Abilify 3 mg p.o. daily Patient was advised to  restart CBT-will refer her again.  GAD-improving Increase Pristiq to 50 mg p.o. nightly Refer for CBT  Insomnia-improving Belsomra 5 to 10 mg p.o. nightly as needed.  Patient reports she is more compliant. She also has hot flashes and it is likely Pristiq might help with the same.  Caffeine use disorder-improving Will monitor closely  Follow-up in clinic in 4 weeks or sooner if needed.   I have spent atleast 20 minutes non face to face with patient today. More than 50 % of the time was spent for preparing to see the patient ( e.g., review of test, records ),ordering medications and test ,psychoeducation and supportive psychotherapy and care coordination,as well as documenting clinical information in electronic health record. This note was generated in part or whole with voice recognition software. Voice recognition is usually quite accurate but there are transcription errors that can and very often do occur. I apologize for any typographical errors that were not detected and corrected.       Ursula Alert, MD 12/08/2019, 10:32 AM

## 2019-12-09 ENCOUNTER — Other Ambulatory Visit: Payer: Self-pay | Admitting: Internal Medicine

## 2019-12-09 DIAGNOSIS — R768 Other specified abnormal immunological findings in serum: Secondary | ICD-10-CM

## 2019-12-13 ENCOUNTER — Telehealth: Payer: Self-pay | Admitting: Internal Medicine

## 2019-12-13 NOTE — Telephone Encounter (Signed)
Left message to return call 

## 2019-12-13 NOTE — Telephone Encounter (Signed)
-----   Message from Bevelyn Buckles, MD sent at 12/09/2019 11:07 AM EDT ----- Urine looks dirty any UTI sxs?  -if so schedule and order urine culture ?   Vitamin D low normal rec take D3 otc 4000 Iu daliy   Liver kidneys normal Cholesterol normal  Thyroid normal  So far markers inflammatory autoimmune d/o normal except ANA +  -try to add ANA with reflex?

## 2019-12-13 NOTE — Telephone Encounter (Signed)
Pt returned your call about lab results and medication that was called into her pharmacy

## 2019-12-15 ENCOUNTER — Telehealth (INDEPENDENT_AMBULATORY_CARE_PROVIDER_SITE_OTHER): Payer: Medicare Other | Admitting: Internal Medicine

## 2019-12-15 ENCOUNTER — Ambulatory Visit: Payer: Medicare Other | Admitting: Internal Medicine

## 2019-12-15 DIAGNOSIS — M62838 Other muscle spasm: Secondary | ICD-10-CM

## 2019-12-15 NOTE — Telephone Encounter (Signed)
Patient no-showed today's appointment; appointment was for 9/30 at 2:00 pm, provider notified for review of record. Mychart message sent for patient to re-schedule.

## 2019-12-21 ENCOUNTER — Telehealth: Payer: Self-pay

## 2019-12-21 ENCOUNTER — Telehealth: Payer: Self-pay | Admitting: Internal Medicine

## 2019-12-21 NOTE — Telephone Encounter (Signed)
Called and left detailed Vm per DPR on file with the following Zio monitor result note from Gillian Shields, NP.  Monitor with predominantly normal sinus rhythm. Early beats in the bottom chamber of the heart called PVCs occurred 13.7% of the time. At recent clinic visit we increased the dose of carvedilol to treat this. She had 3 brief runs of VT and 4 brief runs of SVT which were asymptomatic. Increased dose Coreg will also help prevent these.  Please let her know findings were the same as what we discussed from preliminary report during recent recent office visit.

## 2019-12-21 NOTE — Telephone Encounter (Signed)
lft vm for pt to call ofc regarding general surgery referral.

## 2019-12-23 NOTE — Telephone Encounter (Signed)
Left message to return call.  Per result note for these results: "  Tilford Pillar, [facility]  12/13/2019  2:26 PM EDT Back to Top    Left message to return call.   3rd attempt.    Tilford Pillar, CMA  12/12/2019  3:50 PM EDT     Left message to return call.   Patient seen results on mychart, no response.    Tilford Pillar, CMA  12/09/2019  4:18 PM EDT     Left message to return call.  "

## 2019-12-26 ENCOUNTER — Other Ambulatory Visit: Payer: Self-pay | Admitting: Internal Medicine

## 2019-12-26 DIAGNOSIS — T148XXA Other injury of unspecified body region, initial encounter: Secondary | ICD-10-CM

## 2019-12-26 DIAGNOSIS — M62838 Other muscle spasm: Secondary | ICD-10-CM

## 2019-12-26 MED ORDER — TIZANIDINE HCL 4 MG PO TABS: 4.0000 mg | ORAL_TABLET | Freq: Every evening | ORAL | 0 refills | Status: DC | PRN

## 2019-12-26 NOTE — Addendum Note (Signed)
Addended by: Quentin Ore on: 12/26/2019 08:49 AM   Modules accepted: Level of Service

## 2019-12-26 NOTE — Telephone Encounter (Signed)
Pt called after no show appt requesting muscle relaxer after fall down steps 12/17/19 with worsening hip pain and muscle spasm in back requesting muscle relaxer not givne to her at [facility] ED    A/P Muscle spasm/strain  rx zanaflex 4 mg qhs prn  Agreeable to fee f/u as sch  Time spent 5-10 min telephone charge F/u as sch further assessment   Dr. French Ana McLean-Scocuzza

## 2019-12-27 ENCOUNTER — Other Ambulatory Visit: Payer: Medicare Other

## 2019-12-29 ENCOUNTER — Other Ambulatory Visit: Payer: Self-pay

## 2019-12-29 ENCOUNTER — Ambulatory Visit (INDEPENDENT_AMBULATORY_CARE_PROVIDER_SITE_OTHER): Payer: Medicare Other | Admitting: Internal Medicine

## 2019-12-29 ENCOUNTER — Encounter: Payer: Self-pay | Admitting: Internal Medicine

## 2019-12-29 VITALS — BP 130/88 | HR 56 | Temp 97.9°F | Ht 67.0 in | Wt 249.6 lb

## 2019-12-29 DIAGNOSIS — M25561 Pain in right knee: Secondary | ICD-10-CM

## 2019-12-29 DIAGNOSIS — Z1231 Encounter for screening mammogram for malignant neoplasm of breast: Secondary | ICD-10-CM

## 2019-12-29 DIAGNOSIS — F339 Major depressive disorder, recurrent, unspecified: Secondary | ICD-10-CM

## 2019-12-29 DIAGNOSIS — G8929 Other chronic pain: Secondary | ICD-10-CM

## 2019-12-29 DIAGNOSIS — R319 Hematuria, unspecified: Secondary | ICD-10-CM

## 2019-12-29 DIAGNOSIS — M791 Myalgia, unspecified site: Secondary | ICD-10-CM

## 2019-12-29 DIAGNOSIS — M25551 Pain in right hip: Secondary | ICD-10-CM

## 2019-12-29 DIAGNOSIS — R768 Other specified abnormal immunological findings in serum: Secondary | ICD-10-CM

## 2019-12-29 DIAGNOSIS — R7689 Other specified abnormal immunological findings in serum: Secondary | ICD-10-CM

## 2019-12-29 DIAGNOSIS — Z800 Family history of malignant neoplasm of digestive organs: Secondary | ICD-10-CM

## 2019-12-29 DIAGNOSIS — M25562 Pain in left knee: Secondary | ICD-10-CM

## 2019-12-29 DIAGNOSIS — M5441 Lumbago with sciatica, right side: Secondary | ICD-10-CM | POA: Diagnosis not present

## 2019-12-29 DIAGNOSIS — E669 Obesity, unspecified: Secondary | ICD-10-CM

## 2019-12-29 MED ORDER — METHYLPREDNISOLONE ACETATE 40 MG/ML IJ SUSP
40.0000 mg | Freq: Once | INTRAMUSCULAR | Status: AC
  Administered 2019-12-29: 40 mg via INTRAMUSCULAR

## 2019-12-29 NOTE — Progress Notes (Signed)
Chief Complaint  Patient presents with  . Fall  . Spasms  . Headache  . Abdominal Pain   F/u  1. Fall 12/17/19 down the 8 stairs with soreness right lower back pain and right hip pain and muscle spasm and having some right shoulder pain 7-8/10 at times  Tried heat/ice, zanafex 2 mg prn and helps a little and will try 4 mg given toradol injection in the ED at [facility] 12/17/19  2. Chronic knee pain shots to b/l knees helped with Dr. Posey Pronto ortho but still has right inner knee pain 3. Depression worse on pristiq er 50 mg x 2 days she is upset needs to move and her daughter has not spoken with her or kept up with activities they were going to do and she lives in [place] and has not seen her daughter appt [facility] 01/2020 will try to move up sooner  4. Needs note for pfizer vaccine had reaction with 06/17/19 and unable to get any further doses due to reaction needs letter  5. Strong FH pancreatic cancer wants to consider genetic testing in future  CT 05/2018 negative pancreatic cancer reassuring for pt today  6. ANA + will check further labs today   Review of Systems  Constitutional: Negative for weight loss.  HENT: Negative for hearing loss.   Eyes: Negative for blurred vision.  Respiratory: Negative for shortness of breath.   Cardiovascular: Negative for chest pain.  Musculoskeletal: Positive for back pain, falls and joint pain.  Skin: Negative for rash.  Psychiatric/Behavioral: Positive for depression. Negative for suicidal ideas.   Past Medical History:  Diagnosis Date  . (HFpEF) heart failure with preserved ejection fraction ([place])    a. Echo 2014: EF 65-70%, nl WM, mildly dilated LA, PASP nl; b. 12/2014 Echo: EF 65-70%, no rwma, mod septal hypertrophy w/o LVOT gradient or SAM; c. 07/2017 Echo: EF 55-60%, no rwma, mildly dil RV w/ nl syst fxn. Mildly dil RA. Dilated IVC w/ elevated CVP. Triv post effusion.  Marland Kitchen Anxiety   . Arthritis   . Asthma   . B12 deficiency   . Chronic back pain   .  Chronic headaches   . Chronic pain    a. on methadone  . Concussion    hx of 4  . Coronary artery disease, non-occlusive    a. LHC 1/18: proximal to mid LAD 40% stenosed, mid LAD 30% stenosed, mid RCA 20% stenosed, distal RCA 20% stenosed, EF 55-65%, LVEDP normal  . Depression   . DJD (degenerative joint disease), multiple sites   . Frequent headaches   . Gallstone   . H/O non-ST elevation myocardial infarction (NSTEMI)   . Hand, foot and mouth disease 2016  . History of shingles   . Hypertension   . Iron deficiency anemia   . Long QT interval   . Methadone use    managed by Dr. Primus Bravo  . Migraine    1x/mo  . Motion sickness    cars  . Obesity   . Palpitations    a. 24 hour Holter: NSR, sinus brady down to 48, occasional PVCs & couplets, 8 beats NSVT; b. 30 day event monitor 2015: NSR with rare PVC.  Marland Kitchen Psoriasis   . Syncope and collapse   . Vitamin D deficiency   . Wears dentures    full upper and lower   Past Surgical History:  Procedure Laterality Date  . ABDOMINOPLASTY     tummy tuck ? year   .  [place] SURGERY  2001  . CARDIAC CATHETERIZATION Left 04/16/2016   Procedure: Left Heart Cath and Coronary Angiography;  Surgeon: [name], MD;  Location: [facility];  Service: Cardiovascular;  Laterality: Left;  . CHOLECYSTECTOMY  2001  . COLONOSCOPY WITH PROPOFOL N/A 09/28/2018   Procedure: COLONOSCOPY WITH PROPOFOL;  Surgeon: Virgel Manifold, MD;  Location: [place];  Service: Endoscopy;  Laterality: N/A;  . ESOPHAGOGASTRODUODENOSCOPY (EGD) WITH PROPOFOL N/A 09/28/2018   Procedure: ESOPHAGOGASTRODUODENOSCOPY (EGD) WITH BIOPSY;  Surgeon: Virgel Manifold, MD;  Location: [place];  Service: Endoscopy;  Laterality: N/A;  . GALLBLADDER SURGERY    . GASTRIC BYPASS  2001  . GASTROPLASTY     Family History  Problem Relation Age of Onset  . Heart attack Mother   . Cancer Mother        pancreatitic 69  . Early death Mother   .  Heart attack Father 87       MI  . Early death Father   . Heart disease Father   . Heart attack Brother   . Heart disease Brother   . Arthritis Brother   . Depression Brother   . Diabetes Brother   . Heart attack Maternal Grandmother   . Cancer Maternal Grandmother        pancreatitic   . Heart disease Maternal Grandmother   . Cancer Maternal Uncle        pancreatitic   . Cancer Paternal Grandmother        ? type   . Diabetes Paternal Grandmother   . Cancer Maternal Uncle        pancreatitic   . Breast cancer Maternal Aunt    Social History   Socioeconomic History  . Marital status: Widowed    Spouse name: Not on file  . Number of children: 1  . Years of education: assoc degree  . Highest education level: Associate degree: occupational, Hotel manager, or vocational program  Occupational History  . Not on file  Tobacco Use  . Smoking status: Never Smoker  . Smokeless tobacco: Never Used  Vaping Use  . Vaping Use: Never used  Substance and Sexual Activity  . Alcohol use: No    Alcohol/week: 0.0 standard drinks    Comment: holidays  . Drug use: No  . Sexual activity: Not Currently  Other Topics Concern  . 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 Difficulty of Paying Living Expenses: Not on file  Food Insecurity:   . Worried About Charity fundraiser in the Last Year: Not on file  . Ran Out of Food in the Last Year: Not on file  Transportation Needs:   . Lack of Transportation (Medical): Not on file  . Lack of Transportation (Non-Medical): Not on file  Physical Activity:   . Days of Exercise per Week: Not on file  . Minutes of Exercise per Session: Not on file   Stress:   . Feeling of Stress : Not on file  Social Connections:   . Frequency of Communication with Friends and Family:  Not on file  . Frequency of Social Gatherings with Friends and Family: Not on file  . Attends Religious Services: Not on file  . Active Member of Clubs or Organizations: Not on file  . Attends Archivist Meetings: Not on file  . Marital Status: Not on file  Intimate Partner Violence:   . Fear of Current or Ex-Partner: Not on file  . Emotionally Abused: Not on file  . Physically Abused: Not on file  . Sexually Abused: Not on file   Current Meds  Medication Sig  . albuterol (VENTOLIN HFA) 108 (90 Base) MCG/ACT inhaler Inhale 1-2 puffs into the lungs as needed for wheezing.  . carvedilol (COREG) 12.5 MG tablet Take 1 tablet (12.5 mg total) by mouth 2 (two) times daily.  . clotrimazole (CLOTRIMAZOLE AF) 1 % cream Apply 1 application topically 2 (two) times daily. Inner thigh  . desvenlafaxine (PRISTIQ) 50 MG 24 hr tablet Take 1 tablet (50 mg total) by mouth daily.  . diclofenac sodium (VOLTAREN) 1 % GEL Apply 4 g topically 4 (four) times daily. Prn knees and 2 grams qid prn hands arthritis  . EPINEPHrine 0.3 mg/0.3 mL IJ SOAJ injection Inject 0.3 mLs (0.3 mg total) into the muscle as needed for anaphylaxis.  . furosemide (LASIX) 20 MG tablet Take 1 tablet (20 mg total) by mouth daily.  Marland Kitchen gabapentin (NEURONTIN) 400 MG capsule Limit 2 tablets in the a.m. and midday and 3 tablets each evening  . methadone (DOLOPHINE) 10 MG tablet Limit 1-2 tablets by mouth 2-3 times per day if tolerated  . potassium chloride (KLOR-CON) 10 MEQ tablet Take 1 tablet (10 mEq total) by mouth daily.  . rosuvastatin (CRESTOR) 10 MG tablet Take 1 tablet (10 mg total) by mouth daily.  Marland Kitchen tiZANidine (ZANAFLEX) 4 MG tablet Take 1 tablet (4 mg total) by mouth at bedtime as needed for muscle spasms. (Patient taking differently: Take 2 mg by mouth at bedtime as needed for muscle spasms. )  .  triamcinolone cream (KENALOG) 0.1 % Apply 1 application topically 2 (two) times daily.  . vitamin B-12 (CYANOCOBALAMIN) 500 MCG tablet Take by mouth.   Allergies  Allergen Reactions  . Covid-19 (Mrna) Vaccine Therapist, music) [Covid-19 (Mrna) Vaccine] Itching, Palpitations and Other (See Comments)    Throat closing.   Marland Kitchen Penicillins Hives, Shortness Of Breath and Rash    Has patient had a PCN reaction causing immediate rash, facial/tongue/throat swelling, SOB or lightheadedness with hypotension: Yes Has patient had a PCN reaction causing severe rash involving mucus membranes or skin necrosis: No Has patient had a PCN reaction that required hospitalization No Has patient had a PCN reaction occurring within the last 10 years: No If all of the above answers are "NO", then may proceed with Cephalosporin use.   . Bee Venom    Recent Results (from the past 2160 hour(s))  Magnesium     Status: None   Collection Time: 11/09/19 12:47 PM  Result Value Ref Range   Magnesium 2.0 1.7 - 2.4 mg/dL    Comment: Performed at [facility], [address] [place], [place] [address]  TSH     Status: None   Collection Time: 11/09/19 12:47 PM  Result Value Ref Range   TSH 3.250 0.350 - 4.500 uIU/mL    Comment: Performed by a 3rd Generation assay with a functional sensitivity of <=0.01 uIU/mL. Performed at [facility], [address] [place], [place] [address]   CBC w/Diff  Status: None   Collection Time: 11/09/19 12:47 PM  Result Value Ref Range   WBC 8.8 4.0 - 10.5 K/uL   RBC 4.13 3.87 - 5.11 MIL/uL   Hemoglobin 12.7 12.0 - 15.0 g/dL   HCT 37.6 36 - 46 %   MCV 91.0 80.0 - 100.0 fL   MCH 30.8 26.0 - 34.0 pg   MCHC 33.8 30.0 - 36.0 g/dL   RDW 12.4 11.5 - 15.5 %   Platelets 219 150 - 400 K/uL   nRBC 0.0 0.0 - 0.2 %   Neutrophils Relative % 47 %   Neutro Abs 4.2 1.7 - 7.7 K/uL   Lymphocytes Relative 38 %   Lymphs Abs 3.3 0.7 - 4.0 K/uL   Monocytes Relative 10 %   Monocytes  Absolute 0.8 0.1 - 1.0 K/uL   Eosinophils Relative 4 %   Eosinophils Absolute 0.3 0.0 - 0.5 K/uL   Basophils Relative 1 %   Basophils Absolute 0.1 0.0 - 0.1 K/uL   Immature Granulocytes 0 %   Abs Immature Granulocytes 0.02 0.00 - 0.07 K/uL    Comment: Performed at [facility], [address] [place], [place] [address]  Basic metabolic panel     Status: Abnormal   Collection Time: 11/09/19 12:47 PM  Result Value Ref Range   Sodium 140 135 - 145 mmol/L   Potassium 4.0 3.5 - 5.1 mmol/L   Chloride 101 98 - 111 mmol/L   CO2 28 22 - 32 mmol/L   Glucose, Bld 98 70 - 99 mg/dL    Comment: Glucose reference range applies only to samples taken after fasting for at least 8 hours.   BUN <5 (L) 6 - 20 mg/dL   Creatinine, Ser 0.82 0.44 - 1.00 mg/dL   Calcium 8.8 (L) 8.9 - 10.3 mg/dL   GFR calc non Af Amer >60 >60 mL/min   GFR calc Af Amer >60 >60 mL/min   Anion gap 11 5 - 15    Comment: Performed at [facility], [place], [place] [address]  Troponin I (High Sensitivity)     Status: None   Collection Time: 11/09/19 12:47 PM  Result Value Ref Range   Troponin I (High Sensitivity) 14 <18 ng/L    Comment: (NOTE) Elevated high sensitivity troponin I (hsTnI) values and significant  changes across serial measurements may suggest ACS but many other  chronic and acute conditions are known to elevate hsTnI results.  Refer to the "Links" section for chest pain algorithms and additional  guidance. Performed at [facility], [address] [place], [place] [address]   TSH     Status: None   Collection Time: 12/07/19 11:06 AM  Result Value Ref Range   TSH 3.915 0.350 - 4.500 uIU/mL    Comment: Performed by a 3rd Generation assay with a functional sensitivity of <=0.01 uIU/mL. Performed at [facility], [address] [place], [place] [address]   Comprehensive metabolic panel     Status: None   Collection Time: 12/07/19 11:06 AM  Result  Value Ref Range   Sodium 141 135 - 145 mmol/L   Potassium 3.6 3.5 - 5.1 mmol/L   Chloride 103 98 - 111 mmol/L   CO2 29 22 - 32 mmol/L   Glucose, Bld 91 70 - 99 mg/dL    Comment: Glucose reference range applies only to samples taken after fasting for at least 8 hours.   BUN 9 6 - 20 mg/dL  Creatinine, Ser 0.84 0.44 - 1.00 mg/dL   Calcium 8.9 8.9 - 10.3 mg/dL   Total Protein 6.7 6.5 - 8.1 g/dL   Albumin 3.5 3.5 - 5.0 g/dL   AST 29 15 - 41 U/L   ALT 21 0 - 44 U/L   Alkaline Phosphatase 107 38 - 126 U/L   Total Bilirubin 0.7 0.3 - 1.2 mg/dL   GFR calc non Af Amer >60 >60 mL/min   GFR calc Af Amer >60 >60 mL/min   Anion gap 9 5 - 15    Comment: Performed at [facility], [address] [place], [place] [address]  Lipid panel     Status: None   Collection Time: 12/07/19 11:06 AM  Result Value Ref Range   Cholesterol 114 0 - 200 mg/dL   Triglycerides 63 <150 mg/dL   HDL 54 >40 mg/dL   Total CHOL/HDL Ratio 2.1 RATIO   VLDL 13 0 - 40 mg/dL   LDL Cholesterol 47 0 - 99 mg/dL    Comment:        Total Cholesterol/HDL:CHD Risk Coronary Heart Disease Risk Table                     Men   Women  1/2 Average Risk   3.4   3.3  Average Risk       5.0   4.4  2 X Average Risk   9.6   7.1  3 X Average Risk  23.4   11.0        Use the calculated Patient Ratio above and the CHD Risk Table to determine the patient's CHD Risk.        ATP III CLASSIFICATION (LDL):  <100     mg/dL   Optimal  100-129  mg/dL   Near or Above                    Optimal  130-159  mg/dL   Borderline  160-189  mg/dL   High  >190     mg/dL   Very High Performed at [facility], [address] [place], [place] [address]   Vitamin D (25 hydroxy)     Status: None   Collection Time: 12/07/19 11:06 AM  Result Value Ref Range   Vit D, 25-Hydroxy 30.87 30 - 100 ng/mL    Comment: (NOTE) Vitamin D deficiency has been defined by the [place]  and an Endocrine Society practice guideline  as a level of serum 25-OH  vitamin D less than 20 ng/mL (1,2). The Endocrine Society went on to  further define vitamin D insufficiency as a level between 21 and 29  ng/mL (2).  1. IOM (Institute of Medicine). 2010. Dietary reference intakes for  calcium and D. [place]: The [employer]. 2. Holick MF, Binkley [place], Bischoff-Ferrari HA, et al. Evaluation,  treatment, and prevention of vitamin D deficiency: an Endocrine  Society clinical practice guideline, JCEM. 2011 Jul; 96(7): 1911-30.  Performed at [facility], [place] [address] [place], [place] [address]   Antinuclear Antib (ANA)     Status: Abnormal   Collection Time: 12/07/19 11:06 AM  Result Value Ref Range   Anti Nuclear Antibody (ANA) Positive (A) Negative    Comment: (NOTE) Performed At: [facility] [place], [place] [id number] Rush Farmer MD [id number]   Sedimentation rate     Status: None   Collection Time: 12/07/19 11:06 AM  Result Value Ref Range   Sed Rate 28 0 - 30 mm/hr    Comment: Performed at [facility], [address] [place], [place] [address]  C-reactive protein     Status: None   Collection Time: 12/07/19 11:06 AM  Result Value Ref Range   CRP 0.6 <1.0 mg/dL    Comment: Performed at [facility], [place] [address], [place] [address]  Rheumatoid Factor     Status: None   Collection Time: 12/07/19 11:06 AM  Result Value Ref Range   Rhuematoid fact SerPl-aCnc <10.0 0.0 - 13.9 IU/mL    Comment: (NOTE) Performed At: [facility] [place], [place] [id number] Rush Farmer MD [id number]   Urinalysis, Routine w reflex microscopic     Status: Abnormal   Collection Time: 12/07/19 11:18 AM  Result Value Ref Range   Color, Urine YELLOW (A) YELLOW   APPearance HAZY (A) CLEAR   Specific Gravity, Urine 1.010 1.005 - 1.030   pH 6.0 5.0 - 8.0   Glucose, UA NEGATIVE NEGATIVE mg/dL   Hgb urine dipstick SMALL (A)  NEGATIVE   Bilirubin Urine NEGATIVE NEGATIVE   Ketones, ur NEGATIVE NEGATIVE mg/dL   Protein, ur NEGATIVE NEGATIVE mg/dL   Nitrite NEGATIVE NEGATIVE   Leukocytes,Ua LARGE (A) NEGATIVE   RBC / HPF 0-5 0 - 5 RBC/hpf   WBC, UA 21-50 0 - 5 WBC/hpf   Bacteria, UA RARE (A) NONE SEEN   Squamous Epithelial / LPF 6-10 0 - 5    Comment: Performed at [facility], [address] [place], [place] [address]   Objective  Body mass index is 39.09 kg/m. Wt Readings from Last 3 Encounters:  12/29/19 249 lb 9.6 oz (113.2 kg)  12/07/19 260 lb (117.9 kg)  11/15/19 255 lb (115.7 kg)   Temp Readings from Last 3 Encounters:  12/29/19 97.9 F (36.6 C) (Oral)  11/15/19 98 F (36.7 C) (Oral)  07/26/19 (!) 97.2 F (36.2 C) (Temporal)   BP Readings from Last 3 Encounters:  12/29/19 130/88  12/07/19 120/80  11/15/19 126/80   Pulse Readings from Last 3 Encounters:  12/29/19 (!) 56  12/07/19 67  11/15/19 67    Physical Exam Vitals and nursing note reviewed.  Constitutional:      Appearance: Normal appearance. She is well-developed and well-groomed. She is obese.  HENT:     Head: Normocephalic and atraumatic.  Eyes:     Conjunctiva/sclera: Conjunctivae normal.     Pupils: Pupils are equal, round, and reactive to light.  Cardiovascular:     Rate and Rhythm: Normal rate and regular rhythm.     Heart sounds: Normal heart sounds. No murmur heard.   Pulmonary:     Effort: Pulmonary effort is normal.     Breath sounds: Normal breath sounds.  Musculoskeletal:     Lumbar back: Tenderness present. Positive right straight leg raise test.  Skin:    General: Skin is warm and dry.  Neurological:     General: No focal deficit present.     Mental Status: She is alert and oriented to person, place, and time. Mental status is at baseline.     Gait: Gait normal.  Psychiatric:        Attention and Perception: Attention and perception normal.        Mood and Affect: Mood and affect normal.         Speech: Speech normal.  Behavior: Behavior normal. Behavior is cooperative.        Thought Content: Thought content normal.        Cognition and Memory: Cognition and memory normal.        Judgment: Judgment normal.     Assessment  Plan  Acute right-sided low back pain with right-sided sciatica with right hip pain Xrays [facility] 12/17/19   5 nonrib-bearing lumbar vertebral bodies visualized.   Noacute fracture or malalignment. Minimal anterolisthesis of L3 on L4.   Unchanged multilevel spondylosis, most pronounced at the L5-S1 level.   Surgical clips overlie the mid and upper abdomen.. Visualized bowel is  unremarkable. Soft tissues are otherwise unremarkable.    Impression:   No acute fracture or malalignment. If clinical concern persists for  fracture, suggest cross-sectional imaging for further evaluation.    The preliminary report (critical or emergent communication) was reviewed  prior to this dictation and there are no substantial differences between  the preliminary results and the impressions in this final report.    Study: XR HIP RIGHT 3 VIEWS WITH PELVIS  Indication: fall, W19.XXXA Unspecified fall, initial encounter  Comparison: None   Findings/impression:   No acute fracture or malalignment. Probable synovial herniation pit of the  right femoral neck.   Jointspaces are well-maintained. Mild sclerosis about the symphysis pubis,  which can be seen with osteitis pubis.   Soft tissues are unremarkable.    Impression:   No radiographic evidence of acute fracture or malalignment.    The preliminary report (critical or emergent communication) was reviewed  prior to this dictation and there are no substantial differences between  the preliminary results and the impressions in this final report.   Study: XR KNEE RIGHT 2 VIEWS  Indication: FALL, W19.XXXA Unspecified fall, initial encounter  Comparison: None   Findings/impression:   No acute  fracture or malalignment. Diffuse bony demineralization. No knee  joint effusion.   Mild tricompartmentalosteophytosis with otherwise well-maintained joint  spaces consistent with mild osteoarthritis.   Soft tissues are unremarkable.     The preliminary report (critical or emergent communication) was reviewed  prior to this dictation and there are no substantial differences between  the preliminary results and the impressions in this final report.    depomedrol 40 x 1 today  Can f/u with ortho if not better  Prn zanaflex 4 mg qhs prn   Hematuria, unspecified type - Plan: Urine Culture  ANA positive - Plan: ANA, IFA (with reflex), CYCLIC CITRUL PEPTIDE ANTIBODY, IGG/IGA  FH: pancreatic cancer Consider genetic testing in the future  Chronic pain of both knees F/u with ortho prn Dr. Posey Pronto  Depression, recurrent [facility]) [facility] therapy to try to get pt sooner appt  Obesity (BMI 30-39.9)  rec healthy diet and exercise     HM Never gets flu shot  Tdap ? Had 2009/2010likely due for repeat -pt wants to waitas of 12/21/18 disc again today covid 1/2 had reaction not getting 2nd dose  considershingrix in future rec hep B vaccinelow titer 6 08/18/17 -consider vaccine in the future Hep A immune  Pap pt has not had in a while wants to think about it if needed consider OB/GYNvs PCPpt wants to waitagain as of 12/21/18 and has not had a pap in > 19 years per pt h/o trauma and hard to do paps she will think about this and we will re discuss  Colonoscopy7/14/20/EGDpoor prep needs repeatcolonoscopydone 09/28/18 prep poor and repeat sch 01/2019 Dr. Earley Favor GI  pt never had  EGD 09/28/18 neg bx for eos esophagitis  Mammogram7/16/19 [place] schedule[place] today 12/29/19  DEXA7/16/19 normal  Of notehome sleep studyordered by lung MD Dr. Juanell Fairly never done from 08/2017   Provider: Dr. Olivia Mackie McLean-Scocuzza-Internal Medicine

## 2019-12-29 NOTE — Patient Instructions (Signed)
salonpas or lidocaine pain patch    Journal for Nurse Practitioners, 15(4), [phone]. Retrieved December 21, 2017 from [email or web address]">  Knee Exercises Ask your health care provider which exercises are safe for you. Do exercises exactly as told by your health care provider and adjust them as directed. It is normal to feel mild stretching, pulling, tightness, or discomfort as you do these exercises. Stop right away if you feel sudden pain or your pain gets worse. Do not begin these exercises until told by your health care provider. Stretching and range-of-motion exercises These exercises warm up your muscles and joints and improve the movement and flexibility of your knee. These exercises also help to relieve pain and swelling. Knee extension, prone 1. Lie on your abdomen (prone position) on a bed. 2. Place your left / right knee just beyond the edge of the surface so your knee is not on the bed. You can put a towel under your left / right thigh just above your kneecap for comfort. 3. Relax your leg muscles and allow gravity to straighten your knee (extension). You should feel a stretch behind your left / right knee. 4. Hold this position for __________ seconds. 5. Scoot up so your knee is supported between repetitions. Repeat __________ times. Complete this exercise __________ times a day. Knee flexion, active  1. Lie on your back with both legs straight. If this causes back discomfort, bend your left / right knee so your foot is flat on the floor. 2. Slowly slide your left / right heel back toward your buttocks. Stop when you feel a gentle stretch in the front of your knee or thigh (flexion). 3. Hold this position for __________ seconds. 4. Slowly slide your left / right heel back to the starting position. Repeat __________ times. Complete this exercise __________ times a day. Quadriceps stretch, prone  1. Lie on your abdomen on a firm surface, such as a bed or padded  floor. 2. Bend your left / right knee and hold your ankle. If you cannot reach your ankle or pant leg, loop a belt around your foot and grab the belt instead. 3. Gently pull your heel toward your buttocks. Your knee should not slide out to the side. You should feel a stretch in the front of your thigh and knee (quadriceps). 4. Hold this position for __________ seconds. Repeat __________ times. Complete this exercise __________ times a day. Hamstring, supine 1. Lie on your back (supine position). 2. Loop a belt or towel over the ball of your left / right foot. The ball of your foot is on the walking surface, right under your toes. 3. Straighten your left / right knee and slowly pull on the belt to raise your leg until you feel a gentle stretch behind your knee (hamstring). ? Do not let your knee bend while you do this. ? Keep your other leg flat on the floor. 4. Hold this position for __________ seconds. Repeat __________ times. Complete this exercise __________ times a day. Strengthening exercises These exercises build strength and endurance in your knee. Endurance is the ability to use your muscles for a long time, even after they get tired. Quadriceps, isometric This exercise stretches the muscles in front of your thigh (quadriceps) without moving your knee joint (isometric). 1. Lie on your back with your left / right leg extended and your other knee bent. Put a rolled towel or small pillow under your knee if told by your health care provider. 2. Slowly tense the  muscles in the front of your left / right thigh. You should see your kneecap slide up toward your hip or see increased dimpling just above the knee. This motion will push the back of the knee toward the floor. 3. For __________ seconds, hold the muscle as tight as you can without increasing your pain. 4. Relax the muscles slowly and completely. Repeat __________ times. Complete this exercise __________ times a day. Straight leg  raises This exercise stretches the muscles in front of your thigh (quadriceps) and the muscles that move your hips (hip flexors). 1. Lie on your back with your left / right leg extended and your other knee bent. 2. Tense the muscles in the front of your left / right thigh. You should see your kneecap slide up or see increased dimpling just above the knee. Your thigh may even shake a bit. 3. Keep these muscles tight as you raise your leg 4-6 inches (10-15 cm) off the floor. Do not let your knee bend. 4. Hold this position for __________ seconds. 5. Keep these muscles tense as you lower your leg. 6. Relax your muscles slowly and completely after each repetition. Repeat __________ times. Complete this exercise __________ times a day. Hamstring, isometric 1. Lie on your back on a firm surface. 2. Bend your left / right knee about __________ degrees. 3. Dig your left / right heel into the surface as if you are trying to pull it toward your buttocks. Tighten the muscles in the back of your thighs (hamstring) to "dig" as hard as you can without increasing any pain. 4. Hold this position for __________ seconds. 5. Release the tension gradually and allow your muscles to relax completely for __________ seconds after each repetition. Repeat __________ times. Complete this exercise __________ times a day. Hamstring curls If told by your health care provider, do this exercise while wearing ankle weights. Begin with __________ lb weights. Then increase the weight by 1 lb (0.5 kg) increments. Do not wear ankle weights that are more than __________ lb. 1. Lie on your abdomen with your legs straight. 2. Bend your left / right knee as far as you can without feeling pain. Keep your hips flat against the floor. 3. Hold this position for __________ seconds. 4. Slowly lower your leg to the starting position. Repeat __________ times. Complete this exercise __________ times a day. Squats This exercise strengthens  the muscles in front of your thigh and knee (quadriceps). 1. Stand in front of a table, with your feet and knees pointing straight ahead. You may rest your hands on the table for balance but not for support. 2. Slowly bend your knees and lower your hips like you are going to sit in a chair. ? Keep your weight over your heels, not over your toes. ? Keep your lower legs upright so they are parallel with the table legs. ? Do not let your hips go lower than your knees. ? Do not bend lower than told by your health care provider. ? If your knee pain increases, do not bend as low. 3. Hold the squat position for __________ seconds. 4. Slowly push with your legs to return to standing. Do not use your hands to pull yourself to standing. Repeat __________ times. Complete this exercise __________ times a day. Wall slides This exercise strengthens the muscles in front of your thigh and knee (quadriceps). 1. Lean your back against a smooth wall or door, and walk your feet out 18-24 inches (46-61 cm) from  it. 2. Place your feet hip-width apart. 3. Slowly slide down the wall or door until your knees bend __________ degrees. Keep your knees over your heels, not over your toes. Keep your knees in line with your hips. 4. Hold this position for __________ seconds. Repeat __________ times. Complete this exercise __________ times a day. Straight leg raises This exercise strengthens the muscles that rotate the leg at the hip and move it away from your body (hip abductors). 1. Lie on your side with your left / right leg in the top position. Lie so your head, shoulder, knee, and hip line up. You may bend your bottom knee to help you keep your balance. 2. Roll your hips slightly forward so your hips are stacked directly over each other and your left / right knee is facing forward. 3. Leading with your heel, lift your top leg 4-6 inches (10-15 cm). You should feel the muscles in your outer hip lifting. ? Do not let your  foot drift forward. ? Do not let your knee roll toward the ceiling. 4. Hold this position for __________ seconds. 5. Slowly return your leg to the starting position. 6. Let your muscles relax completely after each repetition. Repeat __________ times. Complete this exercise __________ times a day. Straight leg raises This exercise stretches the muscles that move your hips away from the front of the pelvis (hip extensors). 1. Lie on your abdomen on a firm surface. You can put a pillow under your hips if that is more comfortable. 2. Tense the muscles in your buttocks and lift your left / right leg about 4-6 inches (10-15 cm). Keep your knee straight as you lift your leg. 3. Hold this position for __________ seconds. 4. Slowly lower your leg to the starting position. 5. Let your leg relax completely after each repetition. Repeat __________ times. Complete this exercise __________ times a day. This information is not intended to replace advice given to you by your health care provider. Make sure you discuss any questions you have with your health care provider. Document Revised: 12/22/2017 Document Reviewed: 12/22/2017 [employer] Patient Education  2020 [employer]  Back Exercises The following exercises strengthen the muscles that help to support the trunk and back. They also help to keep the lower back flexible. Doing these exercises can help to prevent back pain or lessen existing pain.  If you have back pain or discomfort, try doing these exercises 2-3 times each day or as told by your health care provider.  As your pain improves, do them once each day, but increase the number of times that you repeat the steps for each exercise (do more repetitions).  To prevent the recurrence of back pain, continue to do these exercises once each day or as told by your health care provider. Do exercises exactly as told by your health care provider and adjust them as directed. It is normal to feel mild  stretching, pulling, tightness, or discomfort as you do these exercises, but you should stop right away if you feel sudden pain or your pain gets worse. Exercises Single knee to chest Repeat these steps 3-5 times for each leg: 1. Lie on your back on a firm bed or the floor with your legs extended. 2. Bring one knee to your chest. Your other leg should stay extended and in contact with the floor. 3. Hold your knee in place by grabbing your knee or thigh with both hands and hold. 4. Pull on your knee until you  feel a gentle stretch in your lower back or buttocks. 5. Hold the stretch for 10-30 seconds. 6. Slowly release and straighten your leg. Pelvic tilt Repeat these steps 5-10 times: 1. Lie on your back on a firm bed or the floor with your legs extended. 2. Bend your knees so they are pointing toward the ceiling and your feet are flat on the floor. 3. Tighten your lower abdominal muscles to press your lower back against the floor. This motion will tilt your pelvis so your tailbone points up toward the ceiling instead of pointing to your feet or the floor. 4. With gentle tension and even breathing, hold this position for 5-10 seconds. Cat-cow Repeat these steps until your lower back becomes more flexible: 1. Get into a hands-and-knees position on a firm surface. Keep your hands under your shoulders, and keep your knees under your hips. You may place padding under your knees for comfort. 2. Let your head hang down toward your chest. Contract your abdominal muscles and point your tailbone toward the floor so your lower back becomes rounded like the back of a cat. 3. Hold this position for 5 seconds. 4. Slowly lift your head, let your abdominal muscles relax and point your tailbone up toward the ceiling so your back forms a sagging arch like the back of a cow. 5. Hold this position for 5 seconds.  Press-ups Repeat these steps 5-10 times: 1. Lie on your abdomen (face-down) on the  floor. 2. Place your palms near your head, about shoulder-width apart. 3. Keeping your back as relaxed as possible and keeping your hips on the floor, slowly straighten your arms to raise the top half of your body and lift your shoulders. Do not use your back muscles to raise your upper torso. You may adjust the placement of your hands to make yourself more comfortable. 4. Hold this position for 5 seconds while you keep your back relaxed. 5. Slowly return to lying flat on the floor.  Bridges Repeat these steps 10 times: 1. Lie on your back on a firm surface. 2. Bend your knees so they are pointing toward the ceiling and your feet are flat on the floor. Your arms should be flat at your sides, next to your body. 3. Tighten your buttocks muscles and lift your buttocks off the floor until your waist is at almost the same height as your knees. You should feel the muscles working in your buttocks and the back of your thighs. If you do not feel these muscles, slide your feet 1-2 inches farther away from your buttocks. 4. Hold this position for 3-5 seconds. 5. Slowly lower your hips to the starting position, and allow your buttocks muscles to relax completely. If this exercise is too easy, try doing it with your arms crossed over your chest. Abdominal crunches Repeat these steps 5-10 times: 1. Lie on your back on a firm bed or the floor with your legs extended. 2. Bend your knees so they are pointing toward the ceiling and your feet are flat on the floor. 3. Cross your arms over your chest. 4. Tip your chin slightly toward your chest without bending your neck. 5. Tighten your abdominal muscles and slowly raise your trunk (torso) high enough to lift your shoulder blades a tiny bit off the floor. Avoid raising your torso higher than that because it can put too much stress on your low back and does not help to strengthen your abdominal muscles. 6. Slowly return to your  starting position. Back  lifts Repeat these steps 5-10 times: 1. Lie on your abdomen (face-down) with your arms at your sides, and rest your forehead on the floor. 2. Tighten the muscles in your legs and your buttocks. 3. Slowly lift your chest off the floor while you keep your hips pressed to the floor. Keep the back of your head in line with the curve in your back. Your eyes should be looking at the floor. 4. Hold this position for 3-5 seconds. 5. Slowly return to your starting position. Contact a health care provider if:  Your back pain or discomfort gets much worse when you do an exercise.  Your worsening back pain or discomfort does not lessen within 2 hours after you exercise. If you have any of these problems, stop doing these exercises right away. Do not do them again unless your health care provider says that you can. Get help right away if:  You develop sudden, severe back pain. If this happens, stop doing the exercises right away. Do not do them again unless your health care provider says that you can. This information is not intended to replace advice given to you by your health care provider. Make sure you discuss any questions you have with your health care provider. Document Revised: 07/08/2018 Document Reviewed: 12/03/2017 [employer] Patient Education  2020 [employer]  Hip Exercises Ask your health care provider which exercises are safe for you. Do exercises exactly as told by your health care provider and adjust them as directed. It is normal to feel mild stretching, pulling, tightness, or discomfort as you do these exercises. Stop right away if you feel sudden pain or your pain gets worse. Do not begin these exercises until told by your health care provider. Stretching and range-of-motion exercises These exercises warm up your muscles and joints and improve the movement and flexibility of your hip. These exercises also help to relieve pain, numbness, and tingling. You may be asked to limit your  range of motion if you had a hip replacement. Talk to your health care provider about these restrictions. Hamstrings, supine  6. Lie on your back (supine position). 7. Loop a belt or towel over the ball of your left / right foot. The ball of your foot is on the walking surface, right under your toes. 8. Straighten your left / right knee and slowly pull on the belt or towel to raise your leg until you feel a gentle stretch behind your knee (hamstring). ? Do not let your knee bend while you do this. ? Keep your other leg flat on the floor. 9. Hold this position for __________ seconds. 10. Slowly return your leg to the starting position. Repeat __________ times. Complete this exercise __________ times a day. Hip rotation  5. Lie on your back on a firm surface. 6. With your left / right hand, gently pull your left / right knee toward the shoulder that is on the same side of the body. Stop when your knee is pointing toward the ceiling. 7. Hold your left / right ankle with your other hand. 8. Keeping your knee steady, gently pull your left / right ankle toward your other shoulder until you feel a stretch in your buttocks. ? Keep your hips and shoulders firmly planted while you do this stretch. 9. Hold this position for __________ seconds. Repeat __________ times. Complete this exercise __________ times a day. Seated stretch This exercise is sometimes called hamstrings and adductors stretch. 5. Sit on the  floor with your legs stretched wide. Keep your knees straight during this exercise. 6. Keeping your head and back in a straight line, bend at your waist to reach for your left foot (position A). You should feel a stretch in your right inner thigh (adductors). 7. Hold this position for __________ seconds. Then slowly return to the upright position. 8. Keeping your head and back in a straight line, bend at your waist to reach forward (position B). You should feel a stretch behind both of your thighs  and knees (hamstrings). 9. Hold this position for __________ seconds. Then slowly return to the upright position. 10. Keeping your head and back in a straight line, bend at your waist to reach for your right foot (position C). You should feel a stretch in your left inner thigh (adductors). 11. Hold this position for __________ seconds. Then slowly return to the upright position. Repeat __________ times. Complete this exercise __________ times a day. Lunge This exercise stretches the muscles of the hip (hip flexors). 5. Place your left / right knee on the floor and bend your other knee so that is directly over your ankle. You should be half-kneeling. 6. Keep good posture with your head over your shoulders. 7. Tighten your buttocks to point your tailbone downward. This will prevent your back from arching too much. 8. You should feel a gentle stretch in the front of your left / right thigh and hip. If you do not feel a stretch, slide your other foot forward slightly and then slowly lunge forward with your chest up until your knee once again lines up over your ankle. ? Make sure your tailbone continues to point downward. 9. Hold this position for __________ seconds. 10. Slowly return to the starting position. Repeat __________ times. Complete this exercise __________ times a day. Strengthening exercises These exercises build strength and endurance in your hip. Endurance is the ability to use your muscles for a long time, even after they get tired. Bridge This exercise strengthens the muscles of your hip (hip extensors). 5. Lie on your back on a firm surface with your knees bent and your feet flat on the floor. 6. Tighten your buttocks muscles and lift your bottom off the floor until the trunk of your body and your hips are level with your thighs. ? Do not arch your back. ? You should feel the muscles working in your buttocks and the back of your thighs. If you do not feel these muscles, slide your  feet 1-2 inches (2.5-5 cm) farther away from your buttocks. 7. Hold this position for __________ seconds. 8. Slowly lower your hips to the starting position. 9. Let your muscles relax completely between repetitions. Repeat __________ times. Complete this exercise __________ times a day. Straight leg raises, side-lying This exercise strengthens the muscles that move the hip joint away from the center of the body (hip abductors). 7. Lie on your side with your left / right leg in the top position. Lie so your head, shoulder, hip, and knee line up. You may bend your bottom knee slightly to help you balance. 8. Roll your hips slightly forward, so your hips are stacked directly over each other and your left / right knee is facing forward. 9. Leading with your heel, lift your top leg 4-6 inches (10-15 cm). You should feel the muscles in your top hip lifting. ? Do not let your foot drift forward. ? Do not let your knee roll toward the ceiling. 10. Hold this  position for __________ seconds. 11. Slowly return to the starting position. 12. Let your muscles relax completely between repetitions. Repeat __________ times. Complete this exercise __________ times a day. Straight leg raises, side-lying This exercise strengthens the muscles that move the hip joint toward the center of the body (hip adductors). 6. Lie on your side with your left / right leg in the bottom position. Lie so your head, shoulder, hip, and knee line up. You may place your upper foot in front to help you balance. 7. Roll your hips slightly forward, so your hips are stacked directly over each other and your left / right knee is facing forward. 8. Tense the muscles in your inner thigh and lift your bottom leg 4-6 inches (10-15 cm). 9. Hold this position for __________ seconds. 10. Slowly return to the starting position. 11. Let your muscles relax completely between repetitions. Repeat __________ times. Complete this exercise __________  times a day. Straight leg raises, supine This exercise strengthens the muscles in the front of your thigh (quadriceps). 5. Lie on your back (supine position) with your left / right leg extended and your other knee bent. 6. Tense the muscles in the front of your left / right thigh. You should see your kneecap slide up or see increased dimpling just above your knee. 7. Keep these muscles tight as you raise your leg 4-6 inches (10-15 cm) off the floor. Do not let your knee bend. 8. Hold this position for __________ seconds. 9. Keep these muscles tense as you lower your leg. 10. Relax the muscles slowly and completely between repetitions. Repeat __________ times. Complete this exercise __________ times a day. Hip abductors, standing This exercise strengthens the muscles that move the leg and hip joint away from the center of the body (hip abductors). 5. Tie one end of a rubber exercise band or tubing to a secure surface, such as a chair, table, or pole. 6. Loop the other end of the band or tubing around your left / right ankle. 7. Keeping your ankle with the band or tubing directly opposite the secured end, step away until there is tension in the tubing or band. Hold on to a chair, table, or pole as needed for balance. 8. Lift your left / right leg out to your side. While you do this: ? Keep your back upright. ? Keep your shoulders over your hips. ? Keep your toes pointing forward. ? Make sure to use your hip muscles to slowly lift your leg. Do not tip your body or forcefully lift your leg. 9. Hold this position for __________ seconds. 10. Slowly return to the starting position. Repeat __________ times. Complete this exercise __________ times a day. Squats This exercise strengthens the muscles in the front of your thigh (quadriceps). 5. Stand in a door frame so your feet and knees are in line with the frame. You may place your hands on the frame for balance. 6. Slowly bend your knees and lower  your hips like you are going to sit in a chair. ? Keep your lower legs in a straight-up-and-down position. ? Do not let your hips go lower than your knees. ? Do not bend your knees lower than told by your health care provider. ? If your hip pain increases, do not bend as low. 7. Hold this position for ___________ seconds. 8. Slowly push with your legs to return to standing. Do not use your hands to pull yourself to standing. Repeat __________ times. Complete this exercise  __________ times a day. This information is not intended to replace advice given to you by your health care provider. Make sure you discuss any questions you have with your health care provider. Document Revised: 10/07/2018 Document Reviewed: 01/12/2018 [employer] Patient Education  2020 [employer].

## 2019-12-30 ENCOUNTER — Other Ambulatory Visit: Payer: Self-pay | Admitting: Psychiatry

## 2019-12-30 DIAGNOSIS — F331 Major depressive disorder, recurrent, moderate: Secondary | ICD-10-CM

## 2019-12-30 DIAGNOSIS — F411 Generalized anxiety disorder: Secondary | ICD-10-CM

## 2019-12-30 DIAGNOSIS — F4310 Post-traumatic stress disorder, unspecified: Secondary | ICD-10-CM

## 2019-12-30 LAB — URINE CULTURE
MICRO NUMBER:: 11072185
SPECIMEN QUALITY:: ADEQUATE

## 2019-12-31 ENCOUNTER — Other Ambulatory Visit: Payer: Self-pay | Admitting: Psychiatry

## 2019-12-31 DIAGNOSIS — F4310 Post-traumatic stress disorder, unspecified: Secondary | ICD-10-CM

## 2019-12-31 DIAGNOSIS — F331 Major depressive disorder, recurrent, moderate: Secondary | ICD-10-CM

## 2019-12-31 DIAGNOSIS — F411 Generalized anxiety disorder: Secondary | ICD-10-CM

## 2019-12-31 LAB — CYCLIC CITRUL PEPTIDE ANTIBODY, IGG/IGA: Cyclic Citrullin Peptide Ab: 6 units (ref 0–19)

## 2019-12-31 LAB — ANTINUCLEAR ANTIBODIES, IFA: ANA Titer 1: NEGATIVE

## 2020-01-03 ENCOUNTER — Other Ambulatory Visit: Payer: Self-pay | Admitting: Psychiatry

## 2020-01-03 DIAGNOSIS — F411 Generalized anxiety disorder: Secondary | ICD-10-CM

## 2020-01-03 DIAGNOSIS — F331 Major depressive disorder, recurrent, moderate: Secondary | ICD-10-CM

## 2020-01-03 DIAGNOSIS — F4310 Post-traumatic stress disorder, unspecified: Secondary | ICD-10-CM

## 2020-01-05 ENCOUNTER — Ambulatory Visit: Payer: Medicare Other | Admitting: Family

## 2020-01-09 ENCOUNTER — Other Ambulatory Visit: Payer: Self-pay | Admitting: Internal Medicine

## 2020-01-09 DIAGNOSIS — M62838 Other muscle spasm: Secondary | ICD-10-CM

## 2020-01-09 DIAGNOSIS — T148XXA Other injury of unspecified body region, initial encounter: Secondary | ICD-10-CM

## 2020-01-10 ENCOUNTER — Encounter: Payer: Self-pay | Admitting: Internal Medicine

## 2020-01-16 ENCOUNTER — Other Ambulatory Visit: Payer: Self-pay

## 2020-01-16 ENCOUNTER — Ambulatory Visit (INDEPENDENT_AMBULATORY_CARE_PROVIDER_SITE_OTHER): Payer: Medicare Other | Admitting: Licensed Clinical Social Worker

## 2020-01-16 DIAGNOSIS — F411 Generalized anxiety disorder: Secondary | ICD-10-CM

## 2020-01-16 DIAGNOSIS — F4310 Post-traumatic stress disorder, unspecified: Secondary | ICD-10-CM

## 2020-01-16 NOTE — Progress Notes (Addendum)
Virtual Visit via Video Note  I connected with Cheyenne Gray on 01/16/20 at 10:00 AM EDT by a video enabled telemedicine application and verified that I am speaking with the correct person using two identifiers.  Video connection was lost when less than 50% of the duration of the visit was complete, at which time the remainder of the visit was completed via audio only.   Location: Patient: home Provider: [facility]   I discussed the limitations of evaluation and management by telemedicine and the availability of in person appointments. The patient expressed understanding and agreed to proceed.  I discussed the assessment and treatment plan with the patient. The patient was provided an opportunity to ask questions and all were answered. The patient agreed with the plan and demonstrated an understanding of the instructions.   The patient was advised to call back or seek an in-person evaluation if the symptoms worsen or if the condition fails to improve as anticipated.  I provided 45 minutes of non-face-to-face time during this encounter.   Kaleya Douse R Rowdy Guerrini, LCSW    THERAPIST PROGRESS NOTE  Session Time: 10:15-11:00a  Participation Level: Active  Behavioral Response: NAAlertDepressed  Type of Therapy: Individual Therapy  Treatment Goals addressed: Coping  Interventions: CBT, Solution Focused, Supportive and Reframing  Summary: Cheyenne Gray is a 54 y.o. female who presents with symptoms related to PTSD and anxiety diagnoses. Pt reports that she is compliant with medication and that she feels recent medication dosage changes (upped dose) has made overall depression symptoms worse. Pt reports increases in social withdrawal, insomnia, low mood, and one incident of fleeting SI. Pt denies a plan or intent to follow through because she loves her daughter.   Allowed pt to explore and express thoughts and feelings surrounding recent external stressors: owner of pts current home has  decided to sell the house so pt needs to find a new place to live, continuing stress related to loss of husband, stress associated with daughter attending college.  Pt reports that she does not have any motivation or initiative to start with any packing that she needs to do--or to find another place to live. Pt has a Child psychotherapist helping her look for affordable housing all over the state of [place]. Allowed pt to explore relationship with daughter and how things have changed since she is in college. Allowed pt to explore some traumatic experiences--pt is somewhat guarded since this is first counseling session after transitioning counselors. Allowed pt to express what makes her feel safe and comfortable within context of counseling session.   Pt reports that she is socially withdrawn, not very physically active, and is not leaving the house. Helped pt to recognize that all of those factors are things that will help her depression symptoms. Pt is willing to try harder if med changes give her more motivation/initiative. Encouraged pt focus on self care and maintain good balance of physical activity and rest.   Suicidal/Homicidal: No  SI, HI, or AVH reported at time of session. Pt reports fleeting suicidal-type thoughts but no active SI with plan or intent to follow through.   Therapist Response: Developed treatment plan w/ pt. Reach out to psychiatrist about upcoming med mgmt visit.   Plan: Return again in 2 weeks. The ongoing treatment plan includes maintaining current levels of progress and continuing to build skills to manage mood, improve stress/anxiety management, emotion regulation, distress tolerance, and behavior modification.   Diagnosis: Axis I: Generalized Anxiety Disorder and Post Traumatic Stress Disorder  Axis II: No diagnosis    Ernest Haber Jachob Mcclean, LCSW 01/16/2020

## 2020-01-17 ENCOUNTER — Other Ambulatory Visit: Payer: Self-pay

## 2020-01-17 ENCOUNTER — Ambulatory Visit (INDEPENDENT_AMBULATORY_CARE_PROVIDER_SITE_OTHER): Payer: Medicare Other

## 2020-01-17 DIAGNOSIS — R55 Syncope and collapse: Secondary | ICD-10-CM

## 2020-01-17 DIAGNOSIS — M7989 Other specified soft tissue disorders: Secondary | ICD-10-CM

## 2020-01-17 LAB — ECHOCARDIOGRAM COMPLETE
Area-P 1/2: 4.12 cm2
S' Lateral: 2.5 cm

## 2020-01-18 ENCOUNTER — Telehealth (INDEPENDENT_AMBULATORY_CARE_PROVIDER_SITE_OTHER): Payer: Medicare Other | Admitting: Psychiatry

## 2020-01-18 ENCOUNTER — Encounter: Payer: Self-pay | Admitting: Psychiatry

## 2020-01-18 DIAGNOSIS — F411 Generalized anxiety disorder: Secondary | ICD-10-CM | POA: Diagnosis not present

## 2020-01-18 DIAGNOSIS — F1590 Other stimulant use, unspecified, uncomplicated: Secondary | ICD-10-CM

## 2020-01-18 DIAGNOSIS — Z9119 Patient's noncompliance with other medical treatment and regimen: Secondary | ICD-10-CM

## 2020-01-18 DIAGNOSIS — F331 Major depressive disorder, recurrent, moderate: Secondary | ICD-10-CM

## 2020-01-18 DIAGNOSIS — F4310 Post-traumatic stress disorder, unspecified: Secondary | ICD-10-CM

## 2020-01-18 DIAGNOSIS — Z91199 Patient's noncompliance with other medical treatment and regimen due to unspecified reason: Secondary | ICD-10-CM

## 2020-01-18 DIAGNOSIS — F5105 Insomnia due to other mental disorder: Secondary | ICD-10-CM

## 2020-01-18 MED ORDER — ARIPIPRAZOLE 2 MG PO TABS: 3.0000 mg | ORAL_TABLET | Freq: Every day | ORAL | 0 refills | Status: DC

## 2020-01-18 MED ORDER — DESVENLAFAXINE SUCCINATE ER 100 MG PO TB24: 100.0000 mg | ORAL_TABLET | Freq: Every day | ORAL | 0 refills | Status: DC

## 2020-01-18 NOTE — Progress Notes (Signed)
Virtual Visit via Telephone Note  I connected with Cheyenne Gray on 01/18/20 at 10:00 AM EDT by telephone and verified that I am speaking with the correct person using two identifiers.  Location Provider Location : [facility] Patient Location : Home  Participants: Patient , Provider   I discussed the limitations, risks, security and privacy concerns of performing an evaluation and management service by telephone and the availability of in person appointments. I also discussed with the patient that there may be a patient responsible charge related to this service. The patient expressed understanding and agreed to proceed.     I discussed the assessment and treatment plan with the patient. The patient was provided an opportunity to ask questions and all were answered. The patient agreed with the plan and demonstrated an understanding of the instructions.   The patient was advised to call back or seek an in-person evaluation if the symptoms worsen or if the condition fails to improve as anticipated.  [place] MD OP Progress Note  01/19/2020 8:25 AM Cheyenne Gray  MRN:  [id number]  Chief Complaint:  Chief Complaint    Follow-up     HPI: Cheyenne Gray is a 54 year old Caucasian female, widowed, on disability, has a history of PTSD, MDD, GAD, insomnia, caffeine use disorder, history of prolonged QT, chronic back pain on methadone, vitamin B12 deficiency, heart failure, iron deficiency, vitamin D deficiency, history of gastric bypass, MI, hypertension was evaluated by phone today.  Patient today reports she has been feeling worse with regards to her mood since the past several weeks.  She reports she barely gets out of her home.  She reports she went to the state fair recently only because she had gotten tickets to previously.  She had to push herself to go for the fair even though previously she used to love going.  Patient reports she does not know what is going on however realizes that she has  a lot of psychosocial stressors.  She also reports a close friend who was like a grandfather figure to her daughter passed away of cancer recently.  She also is moving from her current house.  She got a place to move into in [place].  She hence is busy with everything that is going on in her life right now.  This could be having an impact on her mood.  Patient reports sleep is better since she started a muscle relaxant recently.  She reports she is able to sleep 4 to 5 hours since starting that muscle relaxant.  She reports she had an accidental fall when the step underneath fell apart recently.  She does have aches and pains due to the same.  Patient reports she has been noncompliant with the Abilify.  She does not know what happened.  She may have just ran out.  She does not like the Pristiq that this dosage is helpful or not.  She denies side effects.  Patient denies any suicidality, homicidality or perceptual disturbances.  Patient denies any other concerns today.  Visit Diagnosis:    ICD-10-CM   1. PTSD (post-traumatic stress disorder)  F43.10 desvenlafaxine (PRISTIQ) 100 MG 24 hr tablet    ARIPiprazole (ABILIFY) 2 MG tablet  2. GAD (generalized anxiety disorder)  F41.1 desvenlafaxine (PRISTIQ) 100 MG 24 hr tablet  3. MDD (major depressive disorder), recurrent episode, moderate (HCC)  F33.1 ARIPiprazole (ABILIFY) 2 MG tablet  4. Insomnia due to mental condition  F51.05   5. 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 Noncompliance with treatment regimen  Z91.19     Past Psychiatric History: I have reviewed past psychiatric history from my progress note on 10/17/2017.  Past trials of Paxil, Cymbalta, Valium, melatonin, trazodone, Ambien, Wellbutrin  Past Medical History:  Past Medical History:  Diagnosis Date  . (HFpEF) heart failure with preserved ejection fraction ([place])    a. Echo 2014: EF 65-70%, nl WM, mildly dilated LA, PASP nl; b. 12/2014 Echo: EF 65-70%, no rwma, mod septal  hypertrophy w/o LVOT gradient or SAM; c. 07/2017 Echo: EF 55-60%, no rwma, mildly dil RV w/ nl syst fxn. Mildly dil RA. Dilated IVC w/ elevated CVP. Triv post effusion.  Marland Kitchen Anxiety   . Arthritis   . Asthma   . B12 deficiency   . Chronic back pain   . Chronic headaches   . Chronic pain    a. on methadone  . Concussion    hx of 4  . Coronary artery disease, non-occlusive    a. LHC 1/18: proximal to mid LAD 40% stenosed, mid LAD 30% stenosed, mid RCA 20% stenosed, distal RCA 20% stenosed, EF 55-65%, LVEDP normal  . Depression   . DJD (degenerative joint disease), multiple sites   . Frequent headaches   . Gallstone   . H/O non-ST elevation myocardial infarction (NSTEMI)   . Hand, foot and mouth disease 2016  . History of shingles   . Hypertension   . Iron deficiency anemia   . Long QT interval   . Methadone use    managed by Dr. Primus Bravo  . Migraine    1x/mo  . Motion sickness    cars  . Obesity   . Palpitations    a. 24 hour Holter: NSR, sinus brady down to 48, occasional PVCs & couplets, 8 beats NSVT; b. 30 day event monitor 2015: NSR with rare PVC.  Marland Kitchen Psoriasis   . Syncope and collapse   . Vitamin D deficiency   . Wears dentures    full upper and lower    Past Surgical History:  Procedure Laterality Date  . ABDOMINOPLASTY     tummy tuck ? year   . [place] SURGERY  2001  . CARDIAC CATHETERIZATION Left 04/16/2016   Procedure: Left Heart Cath and Coronary Angiography;  Surgeon: [name], MD;  Location: [facility];  Service: Cardiovascular;  Laterality: Left;  . CHOLECYSTECTOMY  2001  . COLONOSCOPY WITH PROPOFOL N/A 09/28/2018   Procedure: COLONOSCOPY WITH PROPOFOL;  Surgeon: Virgel Manifold, MD;  Location: [facility];  Service: Endoscopy;  Laterality: N/A;  . ESOPHAGOGASTRODUODENOSCOPY (EGD) WITH PROPOFOL N/A 09/28/2018   Procedure: ESOPHAGOGASTRODUODENOSCOPY (EGD) WITH BIOPSY;  Surgeon: Virgel Manifold, MD;  Location: [facility];   Service: Endoscopy;  Laterality: N/A;  . GALLBLADDER SURGERY    . GASTRIC BYPASS  2001  . GASTROPLASTY      Family Psychiatric History: I have reviewed family psychiatric history from my progress note on 11/06/2017.  Family History:  Family History  Problem Relation Age of Onset  . Heart attack Mother   . Cancer Mother        pancreatitic 32  . Early death Mother   . Heart attack Father 2       MI  . Early death Father   . Heart disease Father   . Heart attack Brother   . Heart disease Brother   . Arthritis Brother   . Depression Brother   . Diabetes  Brother   . Heart attack Maternal Grandmother   . Cancer Maternal Grandmother        pancreatitic   . Heart disease Maternal Grandmother   . Cancer Maternal Uncle        pancreatitic   . Cancer Paternal Grandmother        ? type   . Diabetes Paternal Grandmother   . Cancer Maternal Uncle        pancreatitic   . Breast cancer Maternal Aunt     Social History: I have reviewed social history from my progress note on 11/06/2017. Social History   Socioeconomic History  . Marital status: Widowed    Spouse name: Not on file  . Number of children: 1  . Years of education: assoc degree  . Highest education level: Associate degree: occupational, Hotel manager, or vocational program  Occupational History  . Not on file  Tobacco Use  . Smoking status: Never Smoker  . Smokeless tobacco: Never Used  Vaping Use  . Vaping Use: Never used  Substance and Sexual Activity  . Alcohol use: No    Alcohol/week: 0.0 standard drinks    Comment: holidays  . Drug use: No  . Sexual activity: Not Currently  Other Topics Concern  . 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 Difficulty of Paying Living Expenses: Not on file  Food Insecurity:   . Worried About Charity fundraiser in the Last Year: Not on file  . Ran Out of Food in the Last Year: Not on file  Transportation Needs:   . Lack of Transportation (Medical): Not on file  . Lack of Transportation (Non-Medical): Not on file  Physical Activity:   . Days of Exercise per Week: Not on file  . Minutes of Exercise per Session: Not on file  Stress:   . Feeling of Stress : Not on file  Social Connections:   . Frequency of Communication with Friends and Family: Not on file  . Frequency of Social Gatherings with Friends and Family: Not on file  . Attends Religious Services: Not on file  . Active Member of Clubs or Organizations: Not on file  . Attends Archivist Meetings: Not on file  . Marital Status: Not on file    Allergies:  Allergies  Allergen Reactions  . Covid-19 (Mrna) Vaccine Therapist, music) [Covid-19 (Mrna) Vaccine] Itching, Palpitations and Other (See Comments)    Throat closing.   Marland Kitchen Penicillins Hives, Shortness Of Breath and Rash    Has patient had a PCN reaction causing immediate rash, facial/tongue/throat swelling, SOB or lightheadedness with hypotension: Yes Has patient had a PCN reaction causing severe rash involving mucus membranes or skin necrosis: No Has patient had a PCN reaction that required hospitalization No Has patient had a PCN reaction occurring within the last 10 years: No If all of the above answers are "NO", then may proceed with Cephalosporin use.   Raelyn Ensign Venom     Metabolic Disorder Labs: Lab Results  Component Value Date   HGBA1C 5.3 09/01/2018   No results found for:  PROLACTIN Lab Results  Component Value Date   CHOL 114 12/07/2019   TRIG 63 12/07/2019   HDL 54 12/07/2019   CHOLHDL 2.1 12/07/2019   VLDL 13 12/07/2019   LDLCALC 47 12/07/2019   LDLCALC 95 09/01/2018   Lab Results  Component Value Date   TSH 3.915  12/07/2019   TSH 3.250 11/09/2019    Therapeutic Level Labs: No results found for: LITHIUM No results found for: VALPROATE No components found for:  CBMZ  Current Medications: Current Outpatient Medications  Medication Sig Dispense Refill  . albuterol (VENTOLIN HFA) 108 (90 Base) MCG/ACT inhaler Inhale 1-2 puffs into the lungs as needed for wheezing. 18 g 11  . ARIPiprazole (ABILIFY) 2 MG tablet Take 1.5 tablets (3 mg total) by mouth daily with breakfast. 135 tablet 0  . carvedilol (COREG) 12.5 MG tablet Take 1 tablet (12.5 mg total) by mouth 2 (two) times daily. 30 tablet 5  . clotrimazole (CLOTRIMAZOLE AF) 1 % cream Apply 1 application topically 2 (two) times daily. Inner thigh 60 g 12  . desvenlafaxine (PRISTIQ) 100 MG 24 hr tablet Take 1 tablet (100 mg total) by mouth daily with supper. 90 tablet 0  . diclofenac sodium (VOLTAREN) 1 % GEL Apply 4 g topically 4 (four) times daily. Prn knees and 2 grams qid prn hands arthritis 100 g 11  . EPINEPHrine 0.3 mg/0.3 mL IJ SOAJ injection Inject 0.3 mLs (0.3 mg total) into the muscle as needed for anaphylaxis. 2 each 0  . furosemide (LASIX) 20 MG tablet Take 1 tablet (20 mg total) by mouth daily. 35 tablet 2  . gabapentin (NEURONTIN) 400 MG capsule Limit 2 tablets in the a.m. and midday and 3 tablets each evening 630 capsule 3  . isosorbide mononitrate (IMDUR) 60 MG 24 hr tablet Take 1 tablet (60 mg total) by mouth daily. 90 tablet 2  . methadone (DOLOPHINE) 10 MG tablet Limit 1-2 tablets by mouth 2-3 times per day if tolerated 180 tablet 0  . nitroGLYCERIN (NITROSTAT) 0.4 MG SL tablet Place 1 tablet (0.4 mg total) under the tongue every 5 (five) minutes as needed for chest pain. 25 tablet 3  . potassium chloride (KLOR-CON) 10 MEQ tablet Take 1 tablet (10 mEq total) by mouth daily. 30 tablet 2  . rosuvastatin (CRESTOR) 10 MG tablet Take 1 tablet (10 mg total) by mouth daily. 90 tablet 3  . tiZANidine (ZANAFLEX) 4 MG tablet TAKE 1 TABLET BY MOUTH  AT BEDTIME AS NEEDED FOR MUSCLE SPASMS. 90 tablet 1  . triamcinolone cream (KENALOG) 0.1 % Apply 1 application topically 2 (two) times daily. 80 g 0  . vitamin B-12 (CYANOCOBALAMIN) 500 MCG tablet Take by mouth.     No current facility-administered medications for this visit.     Musculoskeletal: Strength & Muscle Tone: UTA Gait & Station: UTA Patient leans: N/A  Psychiatric Specialty Exam: Review of Systems  Constitutional: Positive for fatigue.  Musculoskeletal: Positive for back pain.       Muscle spasms of her LE , right sided body ache  Psychiatric/Behavioral: Positive for dysphoric mood.  All other systems reviewed and are negative.   Last menstrual period 02/18/2016.There is no height or weight on file to calculate BMI.  General Appearance: UTA  Eye Contact:  UTA  Speech:  Normal Rate  Volume:  Normal  Mood:  Dysphoric  Affect:  UTA  Thought Process:  Goal Directed and Descriptions of Associations: Intact  Orientation:  Full (Time, Place, and Person)  Thought Content: Logical   Suicidal Thoughts:  No  Homicidal Thoughts:  No  Memory:  Immediate;   Fair Recent;   Fair Remote;   Fair  Judgement:  Fair  Insight:  Fair  Psychomotor Activity:  UTA  Concentration:  Concentration: Fair and Attention Span: Fair  Recall:  [employer] of Knowledge: Fair  Language: Fair  Akathisia:  No  Handed:  Right  AIMS (if indicated): UTA  Assets:  Communication Skills Desire for Improvement Housing Social Support  ADL's:  Intact  Cognition: WNL  Sleep:  improving    Screenings: GAD-7     Office Visit from 07/26/2019 in [facility] Office Visit from 09/21/2015 in [facility]  Total GAD-7 Score 15 4    Mini-Mental     Office Visit from 10/30/2017 in [facility]  Total Score (max 30 points ) 27    PHQ2-9     Office Visit from 07/26/2019 in [facility] Office Visit from  12/21/2018 in [facility] Office Visit from 08/18/2017 in [facility] Office Visit from 11/15/2015 in [facility] Office Visit from 10/18/2015 in [facility]  PHQ-2 Total Score 5 0 0 0 0  PHQ-9 Total Score 19 -- -- -- --       Assessment and Plan: Cheyenne Gray is a 54 year old Caucasian female, widowed, has a history of PTSD, depression, anxiety, multiple medical problems including coronary artery disease, prolonged QT per history, PVC, gastric bypass, vitamin B12 deficiency, vitamin D deficiency, chronic pain on methadone was evaluated by phone today.  Patient continues to have multiple psychosocial stressors of relationship problems, health problems, death of a friend, housing problems.  Patient also has been noncompliant with her medications Abilify which could also be contributing to her worsening mood symptoms.  Discussed plan as noted below.  Plan PTSD-stable Continue medications.  MDD-unstable Increase Pristiq to 100 mg p.o. nightly Restart Abilify 3 mg p.o. daily.  Patient has been noncompliant Patient to continue CBT  GAD-unstable Increase Pristiq to 100 mg p.o. nightly Continue CBT with Ms. Christina Hussami  Insomnia-improving Belsomra 5 to 10 mg p.o. nightly.   Caffeine use disorder-improving We will monitor closely  Noncompliance with treatment regimen-provided education.  Encouraged compliance.  I have coordinated care with therapist Ms. Christina Hussami  Follow-up in clinic in 3 to 4 weeks or sooner if needed.  I have spent atleast 20 minutes non face to face  with patient today. More than 50 % of the time was spent for preparing to see the patient ( e.g., review of test, records ), ordering medications and test ,psychoeducation and supportive psychotherapy and care coordination,as well as documenting clinical information in electronic health  record. This note was generated in part or whole with voice recognition software. Voice recognition is usually quite accurate but there are transcription errors that can and very often do occur. I apologize for any typographical errors that were not detected and corrected.      Ursula Alert, MD 01/19/2020, 8:25 AM

## 2020-01-23 ENCOUNTER — Ambulatory Visit: Payer: Medicare Other | Admitting: Family

## 2020-02-02 ENCOUNTER — Ambulatory Visit (INDEPENDENT_AMBULATORY_CARE_PROVIDER_SITE_OTHER): Payer: Self-pay | Admitting: Licensed Clinical Social Worker

## 2020-02-02 ENCOUNTER — Telehealth: Payer: Self-pay | Admitting: Physician Assistant

## 2020-02-02 ENCOUNTER — Ambulatory Visit (INDEPENDENT_AMBULATORY_CARE_PROVIDER_SITE_OTHER): Payer: Medicare Other | Admitting: Physician Assistant

## 2020-02-02 ENCOUNTER — Other Ambulatory Visit: Payer: Self-pay

## 2020-02-02 ENCOUNTER — Encounter: Payer: Self-pay | Admitting: Physician Assistant

## 2020-02-02 VITALS — BP 98/70 | HR 59 | Ht 66.0 in | Wt 264.0 lb

## 2020-02-02 DIAGNOSIS — E876 Hypokalemia: Secondary | ICD-10-CM | POA: Diagnosis not present

## 2020-02-02 DIAGNOSIS — R079 Chest pain, unspecified: Secondary | ICD-10-CM

## 2020-02-02 DIAGNOSIS — Z5329 Procedure and treatment not carried out because of patient's decision for other reasons: Secondary | ICD-10-CM

## 2020-02-02 DIAGNOSIS — R55 Syncope and collapse: Secondary | ICD-10-CM

## 2020-02-02 DIAGNOSIS — I25118 Atherosclerotic heart disease of native coronary artery with other forms of angina pectoris: Secondary | ICD-10-CM

## 2020-02-02 DIAGNOSIS — I422 Other hypertrophic cardiomyopathy: Secondary | ICD-10-CM

## 2020-02-02 DIAGNOSIS — I471 Supraventricular tachycardia: Secondary | ICD-10-CM

## 2020-02-02 DIAGNOSIS — I493 Ventricular premature depolarization: Secondary | ICD-10-CM

## 2020-02-02 DIAGNOSIS — I2510 Atherosclerotic heart disease of native coronary artery without angina pectoris: Secondary | ICD-10-CM

## 2020-02-02 DIAGNOSIS — R0600 Dyspnea, unspecified: Secondary | ICD-10-CM

## 2020-02-02 DIAGNOSIS — E785 Hyperlipidemia, unspecified: Secondary | ICD-10-CM

## 2020-02-02 DIAGNOSIS — Z79899 Other long term (current) drug therapy: Secondary | ICD-10-CM

## 2020-02-02 DIAGNOSIS — I5032 Chronic diastolic (congestive) heart failure: Secondary | ICD-10-CM

## 2020-02-02 DIAGNOSIS — D649 Anemia, unspecified: Secondary | ICD-10-CM

## 2020-02-02 DIAGNOSIS — I472 Ventricular tachycardia: Secondary | ICD-10-CM

## 2020-02-02 DIAGNOSIS — R0609 Other forms of dyspnea: Secondary | ICD-10-CM

## 2020-02-02 DIAGNOSIS — R001 Bradycardia, unspecified: Secondary | ICD-10-CM

## 2020-02-02 DIAGNOSIS — R600 Localized edema: Secondary | ICD-10-CM

## 2020-02-02 DIAGNOSIS — I4729 Other ventricular tachycardia: Secondary | ICD-10-CM

## 2020-02-02 MED ORDER — NITROGLYCERIN 0.4 MG SL SUBL: 0.4000 mg | SUBLINGUAL_TABLET | SUBLINGUAL | 3 refills | Status: DC | PRN

## 2020-02-02 MED ORDER — CARVEDILOL 6.25 MG PO TABS: 6.2500 mg | ORAL_TABLET | Freq: Two times a day (BID) | ORAL | 5 refills | Status: DC

## 2020-02-02 NOTE — Progress Notes (Signed)
Office Visit    Patient Name: Cheyenne Gray Date of Encounter: 02/02/2020  Primary Care Provider:  McLean-Scocuzza, Cheyenne Gray Primary Cardiologist:  Cheyenne Gray  Chief Complaint    Chief Complaint  Patient presents with  . Follow-up    [facility] ER; chest pain. Meds reviewed by the pt. verbally. Pt. c/o chest pain that radiates into her back.     54 year old female with history of palpitations with finding of PVCs on monitor, tachycardia, syncope, chest pain, nonobstructive CAD by catheterization in early 2018, aortic atherosclerosis, hyperlipidemia, obesity s/p bariatric surgery, long QT syndrome, chronic pain on methadone, and PTSD who presents today for follow-up.  Past Medical History    Past Medical History:  Diagnosis Date  . (HFpEF) heart failure with preserved ejection fraction (HCC)    a. Echo 2014: EF 65-70%, nl WM, mildly dilated LA, PASP nl; b. 12/2014 Echo: EF 65-70%, no rwma, mod septal hypertrophy w/o LVOT gradient or SAM; c. 07/2017 Echo: EF 55-60%, no rwma, mildly dil RV w/ nl syst fxn. Mildly dil RA. Dilated IVC w/ elevated CVP. Triv post effusion.  Marland Kitchen Anxiety   . Arthritis   . Asthma   . B12 deficiency   . Chronic back pain   . Chronic headaches   . Chronic pain    a. on methadone  . Concussion    hx of 4  . Coronary artery disease, non-occlusive    a. LHC 1/18: proximal to mid LAD 40% stenosed, mid LAD 30% stenosed, mid RCA 20% stenosed, distal RCA 20% stenosed, EF 55-65%, LVEDP normal  . Depression   . DJD (degenerative joint disease), multiple sites   . Frequent headaches   . Gallstone   . H/O non-ST elevation myocardial infarction (NSTEMI)   . Hand, foot and mouth disease 2016  . History of shingles   . Hypertension   . Iron deficiency anemia   . Long QT interval   . Methadone use    managed by Cheyenne Gray  . Migraine    1x/mo  . Motion sickness    cars  . Obesity   . Palpitations    a. 24 hour Holter: NSR, sinus brady down to 48,  occasional PVCs & couplets, 8 beats NSVT; b. 30 day event monitor 2015: NSR with rare PVC.  Marland Kitchen Psoriasis   . Syncope and collapse   . Vitamin D deficiency   . Wears dentures    full upper and lower   Past Surgical History:  Procedure Laterality Date  . ABDOMINOPLASTY     tummy tuck ? year   . BARIATRIC SURGERY  2001  . CARDIAC CATHETERIZATION Left 04/16/2016   Procedure: Left Heart Cath and Coronary Angiography;  Surgeon: Cheyenne Gray;  Location: [facility] INVASIVE CV LAB;  Service: Cardiovascular;  Laterality: Left;  . CHOLECYSTECTOMY  2001  . COLONOSCOPY WITH PROPOFOL N/A 09/28/2018   Procedure: COLONOSCOPY WITH PROPOFOL;  Surgeon: Cheyenne Gray;  Location: [facility];  Service: Endoscopy;  Laterality: N/A;  . ESOPHAGOGASTRODUODENOSCOPY (EGD) WITH PROPOFOL N/A 09/28/2018   Procedure: ESOPHAGOGASTRODUODENOSCOPY (EGD) WITH BIOPSY;  Surgeon: Cheyenne Gray;  Location: [facility];  Service: Endoscopy;  Laterality: N/A;  . GALLBLADDER SURGERY    . GASTRIC BYPASS  2001  . GASTROPLASTY      Allergies  Allergies  Allergen Reactions  . Covid-19 (Mrna) Vaccine Proofreader) [Covid-19 (Mrna) Vaccine] Itching, Palpitations and Other (See Comments)    Throat closing.   Marland Kitchen  Penicillins Hives, Shortness Of Breath and Rash    Has patient had a PCN reaction causing immediate rash, facial/tongue/throat swelling, SOB or lightheadedness with hypotension: Yes Has patient had a PCN reaction causing severe rash involving mucus membranes or skin necrosis: No Has patient had a PCN reaction that required hospitalization No Has patient had a PCN reaction occurring within the last 10 years: No If all of the above answers are "NO", then may proceed with Cephalosporin use.   Cheyenne Gray Venom     History of Present Illness    Cheyenne Gray is a 54 y.o. female with PMH as above.   She reports family history of heart disease with her father dying of a heart attack at age  38yo. She reports growing up in a smoking household.  Previous event monitoring 2015 showed NSR with rare PVCs.  12/2014 echo showed normal LVEF.  01/2017 Lexiscan Myoview was ruled low risk with small to moderate sized region of predominantly fixed infarct in the mid to distal anterior septal, anteroseptal apical wall with mild peri-infarct ischemia.  07/2017 echo showed normal EF.  She was started on Lasix for lower extremity swelling and weight gain with subsequent 15 pound weight loss and improvement in lower extremity edema.  Due to elevated liver enzymes, her Crestor was discontinued in the past; however, later her statin was resumed without issue.  She called the office 10/31/2019. At that time, she reported a recent episode of syncope while in [place].  She also noted a 2-week history of headache and multiple hot flashes.  She noted pain in her left chest that radiated into her arm with associated shortness of breath.  ED evaluation was declined.  She was seen 8/25 for follow-up and noted a dramatic increase in the frequency of her hot flashes over the last 2 months leading up to her visit.  She had 2 syncopal episodes, including the one at [facility].  She reported an additional episode of syncope in her kitchen.  She did not have any prodromal symptoms.  LOC was witnessed by family members and lasted only seconds.  She was noted to go long periods of time without eating and only drinking 5 to 7 cans of Coke per day.  She noted stress with her housing situation.  Cardiac monitoring was recommended.  She continued to report left arm and chest pain.  EKG was without acute ST/T changes.  Stat troponin was negative.  She was started on carvedilol 6.25 mg twice daily due to frequent PVCs on EKG.  Cardiac monitoring showed predominantly NSR.  She had 3 runs of VT, the longest lasting 10 beats.  She had 4 runs of SVT.  The longest episode lasted 18 beats.  Isolated PACs/PVCs were also noted.  PVCs were frequent  at 13.7% and numbered [id number].  Triggered events corresponded with her PVCs. She was seen for follow-up after monitoring 12/07/2019 by Gillian Shields, NP.  She denied any recent syncope.  She was eating regular meals.  She still reported lower extremity edema and dyspnea.  She had gained 11 pounds since she had been in the office.  She was taking her Lasix regularly.  She occasionally had palpitations.  She reported fatigue and that she was easily worn out.  Recommendation was to increase carvedilol to 12.5 mg twice daily.  Lasix was increased to 40 mg once daily for 5 days, followed by Lasix 20 mg daily thereafter.  She was started on potassium 10 M EQ daily.  Echo was ordered.  [facility] for chest pain.  High-sensitivity troponin negative.  Hypokalemia was noted and repleted; however, I am unable to find any repeat labs with her initial potassium noted to be a hemolyzed sample, which we discussed could lead to falsely elevated potassium.  Magnesium was also repleted.  CTA was negative for PE.  Moderate to severe coronary artery calcifications were noted.  She also had a fluid-filled esophagus with recommendation to correlate for symptoms of dysphagia.  Also noted was intrahepatic and common bile duct dilation with recommendation to follow-up per PCP/GI.  She was started on Bentyl 20 mg every 6 hours as needed before meals.  She was also prescribed Pepcid 20 mg twice daily for 1 month.  She was provided with a referral to [facility].  It was suspected that her chest pain was likely 2/2 esophageal spasm/stricture.  Today, 02/02/2020, she returns to clinic and notes that she is not feeling well.  She reportedly has had nausea and emesis with Bentyl.  She also reports muscle spasms and pain.  She continues to note atypical chest pain as described above.  She reports ongoing palpitations.  She also notes episodes of confusion, as well as dizziness.  She has not had an  additional syncopal episode since outlined above.  She continues to note stressors, including concern regarding the influence of her family history and her health.  She has low energy and has not started working out the same way that she did before the pandemic.  She is uncertain if she has sleep apnea. She reports drinking a lot of soda and overall poor intake, only eating a meatball in the last 2 days.  She continues to have dyspnea and lower extremity edema as outlined above.  She has not been taking potassium with her Lasix.  She notes continued concern regarding weight gain with low oral intake.  She has had significant nausea, which she attributes to the Bentyl.  She reports both nausea and emesis after taking the Bentyl after discharge. She denies any signs or symptoms of bleeding.  She does admit that she has not been able to follow-up with GI and plans to do so soon.  Echo and Zio monitoring were reviewed in detail, as well as her [facility] admission with recommendations as below.    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  The genetic testing was reportedly performed by her PCP due to history of pancreatic cancer in her mother.  She reports her PCP has also been monitoring her ferritin and requests blood counts and ferritin to be obtained during today's labs and sent to her PCP.  Home Medications    Current Outpatient Medications on File Prior to Visit  Medication Sig Dispense Refill  . albuterol (VENTOLIN HFA) 108 (90 Base) MCG/ACT inhaler Inhale 1-2 puffs into the lungs as needed for wheezing. 18 g 11  . ARIPiprazole (ABILIFY) 2 MG tablet Take 1.5 tablets (3 mg total) by mouth daily with breakfast. 135 tablet 0  . carvedilol (COREG) 12.5 MG tablet Take 1 tablet (12.5 mg total) by mouth 2 (two)  times daily. 30 tablet 5  . clotrimazole (CLOTRIMAZOLE AF) 1 % cream Apply 1 application topically 2 (two) times daily. Inner thigh 60 g 12  .  desvenlafaxine (PRISTIQ) 100 MG 24 hr tablet Take 1 tablet (100 mg total) by mouth daily with supper. 90 tablet 0  . diclofenac sodium (VOLTAREN) 1 % GEL Apply 4 g topically 4 (four) times daily. Prn knees and 2 grams qid prn hands arthritis 100 g 11  . dicyclomine (BENTYL) 20 MG tablet Take 20 mg by mouth every 6 (six) hours.     Marland Kitchen EPINEPHrine 0.3 mg/0.3 mL IJ SOAJ injection Inject 0.3 mLs (0.3 mg total) into the muscle as needed for anaphylaxis. 2 each 0  . furosemide (LASIX) 20 MG tablet Take 1 tablet (20 mg total) by mouth daily. 35 tablet 2  . gabapentin (NEURONTIN) 400 MG capsule Limit 2 tablets in the a.m. and midday and 3 tablets each evening 630 capsule 3  . isosorbide mononitrate (IMDUR) 60 MG 24 hr tablet Take 1 tablet (60 mg total) by mouth daily. 90 tablet 2  . methadone (DOLOPHINE) 10 MG tablet Limit 1-2 tablets by mouth 2-3 times per day if tolerated 180 tablet 0  . nitroGLYCERIN (NITROSTAT) 0.4 MG SL tablet Place 1 tablet (0.4 mg total) under the tongue every 5 (five) minutes as needed for chest pain. 25 tablet 3  . potassium chloride (KLOR-CON) 10 MEQ tablet Take 1 tablet (10 mEq total) by mouth daily. 30 tablet 2  . rosuvastatin (CRESTOR) 10 MG tablet Take 1 tablet (10 mg total) by mouth daily. 90 tablet 3  . tiZANidine (ZANAFLEX) 4 MG tablet TAKE 1 TABLET BY MOUTH AT BEDTIME AS NEEDED FOR MUSCLE SPASMS. 90 tablet 1  . triamcinolone cream (KENALOG) 0.1 % Apply 1 application topically 2 (two) times daily. 80 g 0  . vitamin B-12 (CYANOCOBALAMIN) 500 MCG tablet Take by mouth.     No current facility-administered medications on file prior to visit.    Review of Systems    She reports chest pain, nausea, emesis, palpitations, poor appetite, weight gain, dyspnea (unchanged), stable edema, confusion, dizziness, and fatigue.  She denies  pnd,  orthopnea, syncope.  All other systems reviewed and are otherwise negative except as noted above.  Physical Exam    VS:  BP 98/70 (BP Location: Left Arm, Patient Position: Sitting, Cuff Size: Large)   Pulse (!) 59   Ht 5\' 6"  (1.676 m)   Wt 264 lb (119.7 kg)   LMP 02/18/2016 (Approximate) Comment: neg HCG-03/26/2016  SpO2 97%   BMI 42.61 kg/m  , BMI Body mass index is 42.61 kg/m. GEN: Well nourished, well developed, in no acute distress. HEENT: normal. Neck: Supple, no JVD, carotid bruits, or masses. Cardiac: Bradycardic but regular with extrasystole, 2/6 holosystolic murmur.no rubs, or gallops. No clubbing, cyanosis. mild lower extremity edema.  Radials/DP/PT 2+ and equal bilaterally.  Respiratory:  Respirations regular and unlabored, clear to auscultation bilaterally. GI: Soft, nontender, nondistended, BS + x 4. MS: no deformity or atrophy. Skin: warm and dry, no rash. Neuro:  Strength and sensation are intact. Psych: Normal affect.  Accessory Clinical Findings    ECG personally reviewed by me today -sinus bradycardia at 59 bpm with occasional PVCs and QTC 437 ms.  PR interval 186 ms, QRS 78 ms, possible left atrial enlargement- no acute changes.  VITALS Reviewed today   Temp Readings from Last 3 Encounters:  12/29/19 97.9 F (36.6 C) (Oral)  11/15/19 98 F (36.7 C) (Oral)  07/26/19 (!) 97.2 F (36.2 C) (Temporal)   BP Readings from Last 3 Encounters:  02/02/20 98/70  12/29/19 130/88  12/07/19 120/80  Pulse Readings from Last 3 Encounters:  02/02/20 (!) 59  12/29/19 (!) 56  12/07/19 67    Wt Readings from Last 3 Encounters:  02/02/20 264 lb (119.7 kg)  12/29/19 249 lb 9.6 oz (113.2 kg)  12/07/19 260 lb (117.9 kg)     LABS  reviewed today   Lab Results  Component Value Date   WBC 8.8 11/09/2019   HGB 12.7 11/09/2019   HCT 37.6 11/09/2019   MCV 91.0 11/09/2019   PLT 219 11/09/2019   Lab Results  Component Value Date   CREATININE 0.84 12/07/2019   BUN  9 12/07/2019   NA 141 12/07/2019   K 3.6 12/07/2019   CL 103 12/07/2019   CO2 29 12/07/2019   Lab Results  Component Value Date   ALT 21 12/07/2019   AST 29 12/07/2019   ALKPHOS 107 12/07/2019   BILITOT 0.7 12/07/2019   Lab Results  Component Value Date   CHOL 114 12/07/2019   HDL 54 12/07/2019   LDLCALC 47 12/07/2019   TRIG 63 12/07/2019   CHOLHDL 2.1 12/07/2019    Lab Results  Component Value Date   HGBA1C 5.3 09/01/2018   Lab Results  Component Value Date   TSH 3.915 12/07/2019     STUDIES/PROCEDURES reviewed today   Echo 01/17/20 1. Left ventricular ejection fraction, by estimation, is 55 to 60%. The  left ventricle has normal function. Left ventricular endocardial border  not optimally defined to evaluate regional wall motion. There is moderate  asymmetric left ventricular  hypertrophy of the septal segment. Left ventricular diastolic parameters  are consistent with Grade II diastolic dysfunction (pseudonormalization).  Elevated left atrial pressure.  2. Right ventricular systolic function is normal. The right ventricular  size is normal. Tricuspid regurgitation signal is inadequate for assessing  PA pressure.  3. The mitral valve is normal in structure. Trivial mitral valve  regurgitation. No evidence of mitral stenosis.  4. The aortic valve has an indeterminant number of cusps. There is mild  calcification of the aortic valve. There is mild thickening of the aortic  valve. Aortic valve regurgitation is not visualized. Mild to moderate  aortic valve sclerosis/calcification  is present, without any evidence of aortic stenosis.  5. The inferior vena cava is normal in size with greater than 50%  respiratory variability, suggesting right atrial pressure of 3 mmHg.   Cardiac monitoring 12/16/2019 Event monitor Normal sinus rhythm avg HR of 76 bpm. 3 Ventricular Tachycardia runs occurred, the run with the fastest interval lasting 5 beats with a max rate  of 156 bpm,  the longest lasting 10 beats with an avg rate of 119 bpm.  No patient triggers noted 4 Supraventricular Tachycardia runs occurred, the run with the fastest interval lasting 5 beats with a max rate of 214 bpm, the longest lasting 18 beats with an avg rate of 100 bpm.  No patient triggers noted  Isolated SVEs were rare (<1.0%), SVE Couplets were rare (<1.0%), and SVE Triplets were rare (<1.0%). Isolated VEs were frequent (13.7%, 100944), VE Couplets were rare (<1.0%, 1001), and VE Triplets were rare (<1.0%, 84).  Patient triggered events associated with PVCs  LHC 03/2016  Prox LAD to Mid LAD lesion, 40 %stenosed.  Mid LAD lesion, 30 %stenosed.  Mid RCA lesion, 20 %stenosed.  Dist RCA lesion, 20 %stenosed.  The left ventricular systolic function is normal.  LV end diastolic pressure is normal.  The left ventricular ejection fraction is 55-65% by visual estimate.  There  is no mitral valve regurgitation.  Assessment & Plan    Chest pain, chronic History of CAD/aortic atherosclerosis Family history reported of early cardiac death --Reports recent chest pain with visit to [facility] emergency department as above. [facility] ED work-up showed hypokalemia and hypomagnesia with electrolytes repleted but not yet rechecked with recommendations as below.  It was thought that her CP was likely GI in etiology given findings associated with her esophagus and intrahepatic /common bile duct as above and with recommendation to follow-up with her PCP and GI. Bentyl and Pepcid prescribed. EKG today without acute ST/T changes / PVCs noted. 2018 LHC as above without significant dz. No current chest pain. Recent cardiac monitoring reviewed with PVCs, NSVT, PSVT as copied and pasted above. Recent echo reviewed as copied above with EF 55 to 60%, as well as asymmetric hypertrophy with recommendations as below, especially in light of her recent genetic testing.  Etiology of chest pain is currently unclear,  given multiple sources for her discomfort.  Recommendation was to follow-up with GI as soon as able.  Further work-up of her asymmetric hypertrophy and autosomal dominant mutation associated with HOCM recommended with cardiac MRI, as well as referral provided for genetic counseling. Recommended using Lasix sparingly and avoiding nitrates pending further wrokup. KCl repletion / Mg supplementation updates to be provided if needed pending repeat BMET/Mg. Discussed increasing hydration, decreasing soda, avoiding heat and overexertion. At RTC, could consider transition to verapamil.  Case discussed with primary cardiologist same day.  History of syncope  --Denies recurrence of syncope since previously reported episodes as above. Etiologies of recurrent syncope discussed included poor oral intake, electrolyte abnormalities,CM given recent genetic testing, bradycardia, hypotension, vasovagal (given recent emesis), and arrhythmia.  As above, recommendation was for cardiac MRI and referral to genetic counselor given echo and genetic testing results. Recheck BMET/magnesium given recent electrolyte abnormalities at [facility] ED as above without follow-up labs to confirm electrolytes at goal.  Given bradycardia and hypotension, decreased to carvedilol 6.25 mg twice daily. Discussed increasing oral intake, avoiding lasix / nitrates, and decreasing soda.  Monitor blood pressure at home.  Check CBC and ferritin per pt request, and given she reports a history of anemia. No s/sx of bleeding **Addendum: Recurrent syncope reported after today's visit while at the [employer] with live Zio monitoring x2 weeks mailed to the patient. She was advised to present to the ED if further syncope.  Frequent PVCs, NSVT, pSVT Sinus bradycardia --Cardiac monitoring shows frequent PVCs, runs of SVT, and short runs of NSVT.  Bradycardic today with PVCs. Check BMET, magnesium. Workup of hypertrophic CM as above with cMRI and referral to genetic  counselor. Increase oral intake and avoid lasix. Further recommendations pending labs and workup. **Addendum: After her visit today, and given her syncope after our visit, live 2 week Zio monitor mailed to patient. Consider referral to EP based on monitoring findings, recurrent sx, or cMRI.  Genetic testing positive for mutation associated with HCM --Genetic testing positive for autosomal dominant mutations in MYBPC 3, associated with hypertrophic cardiomyopathy.  Echo without evidence of obstructive CM. Recent syncope and CP reported. Case discussed with primary cardiologist same day. Plan for cardiac MRI, as well as schedule for follow-up genetic counseling appointment with Brent Bulla.  This will be discussed with patient once able to reach her via phone call.  Discuss with family, given possibility for inheritance.  Recommend using Lasix sparingly and holding nitrates.  Reviewed precautions to stay well-hydrated and avoid overexertion/heat today. Decrease  soda.  Instructed patient to call the office if any further syncope. Also, would recommend consider transitioning from current carvedilol to verapamil.   **Addendum: After today's visit, she did call with an episode of syncope same day as reported above with live Zio monitor mailed to patient.  Consider EP referral pending results of live Zio monitoring, if indicated.  Recent hypokalemia, hypomagnesia --Hypokalemia and hypomagnesia seen on recent labs with ED notes reporting repletion; however, repeat labs are not available to confirm she is currently at goal at discharge from the ED. [facility] ED sample was also noted as hemolyzed, which could have falsely elevated her potassium as well. Recommend repeat BMET and magnesium with potassium goal 4.0 magnesium goal 2.0. Hold lasix, especially given poor oral intake and recent emesis as reported above. Recommendations regarding potassium supplementation to be provided based on repeat labs. Consider as contributing  to her PVCs today. Discussed that low K/Mg can increase risk for ectopy / arrhythmia.   Hypotension Poor oral intake --She has not been eating well and does admit to drinking large amounts of soda, which could be contributing to her symptoms, as well as her low blood pressure.  Increase hydration and oral intake.   HLD, LDL goal below 70 --Continue rosuvastatin 10 mg daily.   Medication changes: Decrease Coreg to 6.25mg  BID. Avoid lasix / nitrates. Consider verapamil. K/Mg supplementation recommendations pending labs. Labs ordered: BMET, Mg, CBC, Ferritin (per pt request - to go to PCP) Studies / Imaging ordered: cMRI, referral to genetic counselor, live [place] monitor x2 weeks Future considerations: Consider verapamil. Pending workup, EP? Disposition: RTC based on labs, imagining, and monitoring above.     Lennon Alstrom, PA-C 02/02/2020

## 2020-02-02 NOTE — Telephone Encounter (Signed)
STAT if patient feels like he/she is going to faint   1) Are you dizzy now? yes  2) Do you feel faint or have you passed out? Passed out in [employer] after visit  3) Do you have any other symptoms? Vomiting, shaking, horible headache  4) Have you checked your HR and BP (record if available)?  141/101 51/124 when passed out        50/98 when leaving visit

## 2020-02-02 NOTE — Telephone Encounter (Signed)
Called and spoke to pt. States after she left ov today she went to [employer] and passed out. Denies hitting head and denies any injury.  States she is now home with her friend at her side. Still c/o dizziness and nausea. Current BP/HR 148/102 69. Notified Marisue Ivan, [place] - Zio AT monitor ordered for 14 days. Explained to patient should receive in the mail within 48 hrs with detailed instructions. Explained to pt that must remain within 10 feet of gateway to ensure proper recording. Advised pt if any worsening symptoms in the interim to call 911 or have someone take her to the ER. Pt verbalized understanding.

## 2020-02-02 NOTE — Progress Notes (Signed)
LCSW counselor tried to connect with patient for scheduled appointment via MyChart video text request x 2 and email request; also tried to connect via phone without success. LCSW counselor left message for patient to call office number to reschedule OPT appointment.  

## 2020-02-02 NOTE — Patient Instructions (Addendum)
Medication Instructions:  Your physician has recommended you make the following change in your medication:   DECREASE carvedilol (COREG) 6.25 MG tablet ONE tablet TWICE daily  *If you need a refill on your cardiac medications before your next appointment, please call your pharmacy*   Lab Work: Your physician recommends that you have lab work TODAY: Bmet, CBC, Magnesium, Ferritin  If you have labs (blood work) drawn today and your tests are completely normal, you will receive your results only by: Marland Kitchen MyChart Message (if you have MyChart) OR . A paper copy in the mail If you have any lab test that is abnormal or we need to change your treatment, we will call you to review the results.   Testing/Procedures:  Your provider has recommended you get scheduled for a PYP scan. Our office will contact you to scheduled and discuss in detail.    No special instructions aside from no perfumes/ fragrances/ deodarant  Once your test is scheduled, if you need to cancel or reschedule, please call: [phone]   Follow-Up: At [facility], you and your health needs are our priority.  As part of our continuing mission to provide you with exceptional heart care, we have created designated Provider Care Teams.  These Care Teams include your primary Cardiologist (physician) and Advanced Practice Providers (APPs -  Physician Assistants and Nurse Practitioners) who all work together to provide you with the care you need, when you need it.  We recommend signing up for the patient portal called "MyChart".  Sign up information is provided on this After Visit Summary.  MyChart is used to connect with patients for Virtual Visits (Telemedicine).  Patients are able to view lab/test results, encounter notes, upcoming appointments, etc.  Non-urgent messages can be sent to your provider as well.   To learn more about what you can do with MyChart, go to [email or web address].    Your next appointment:     Pending completion of testing  The format for your next appointment:   In Person  Provider:   You may see Julien Nordmann, MD or one of the following Advanced Practice Providers on your designated Care Team:    Nicolasa Ducking, NP  Eula Listen, PA-C  Marisue Ivan, PA-C  Cadence Chesterbrook, [place]  Gillian Shields, NP   **Please call our office if you pass out again [phone]**

## 2020-02-03 ENCOUNTER — Ambulatory Visit: Payer: Medicare Other | Admitting: Family

## 2020-02-03 ENCOUNTER — Telehealth: Payer: Self-pay | Admitting: Physician Assistant

## 2020-02-03 ENCOUNTER — Telehealth: Payer: Self-pay | Admitting: Cardiovascular Disease

## 2020-02-03 DIAGNOSIS — R55 Syncope and collapse: Secondary | ICD-10-CM

## 2020-02-03 DIAGNOSIS — R790 Abnormal level of blood mineral: Secondary | ICD-10-CM

## 2020-02-03 DIAGNOSIS — Z79899 Other long term (current) drug therapy: Secondary | ICD-10-CM

## 2020-02-03 DIAGNOSIS — I422 Other hypertrophic cardiomyopathy: Secondary | ICD-10-CM

## 2020-02-03 LAB — MAGNESIUM: Magnesium: 1.6 mg/dL (ref 1.6–2.3)

## 2020-02-03 LAB — CBC
Hematocrit: 39.1 % (ref 34.0–46.6)
Hemoglobin: 13.3 g/dL (ref 11.1–15.9)
MCH: 30.9 pg (ref 26.6–33.0)
MCHC: 34 g/dL (ref 31.5–35.7)
MCV: 91 fL (ref 79–97)
Platelets: 234 10*3/uL (ref 150–450)
RBC: 4.3 x10E6/uL (ref 3.77–5.28)
RDW: 12.6 % (ref 11.7–15.4)
WBC: 9.9 10*3/uL (ref 3.4–10.8)

## 2020-02-03 LAB — BASIC METABOLIC PANEL
BUN/Creatinine Ratio: 9 (ref 9–23)
BUN: 9 mg/dL (ref 6–24)
CO2: 26 mmol/L (ref 20–29)
Calcium: 9.3 mg/dL (ref 8.7–10.2)
Chloride: 101 mmol/L (ref 96–106)
Creatinine, Ser: 0.96 mg/dL (ref 0.57–1.00)
GFR calc Af Amer: 79 mL/min/{1.73_m2} (ref >59)
GFR calc non Af Amer: 68 mL/min/{1.73_m2} (ref >59)
Glucose: 100 mg/dL — ABNORMAL HIGH (ref 65–99)
Potassium: 4.2 mmol/L (ref 3.5–5.2)
Sodium: 142 mmol/L (ref 134–144)

## 2020-02-03 LAB — FERRITIN: Ferritin: 205 ng/mL — ABNORMAL HIGH (ref 15–150)

## 2020-02-03 MED ORDER — MAGNESIUM OXIDE 400 MG PO CAPS: 1.0000 | ORAL_CAPSULE | Freq: Every day | ORAL | 6 refills | Status: AC

## 2020-02-03 NOTE — Telephone Encounter (Signed)
Spoke with patient regarding recommendation for cardiac MRI and genetic counseling.  Informed her that someone will be in touch with her on Monday to schedule the cardiac MRI and refer her for genetic counseling. Recommended she hold her Imdur over the weekend and respond to my MyChart message (response will go directly to my inbox) if this causes her any discomfort.  Again reviewed the recommendation for cardiac monitoring, given her additional episode of syncope after our visit.  Patient understanding and appreciation for the call was expressed.

## 2020-02-03 NOTE — Telephone Encounter (Signed)
Lennon Alstrom, PA-C  02/03/2020 7:56 AM EST     Please let Ms. Hockley know that her potassium is at goal and renal function stable. Her magnesium is on the lower side. If agreeable, she can start magnesium oxide 400mg  daily. We can recheck her Mg in 1-2 weeks. Her blood counts are normal and she is not anemic. Her ferritin is elevated, which I will forward to her PCP.  I tried to reach her yesterday via phone to update her regarding the need to schedule a cardiac MRI and genetic counseling. If you reach her, please let me know when it works best to give her a call.    Called patient with lab results. Patient is agreeable to taking Magnesium oxide 400 mg daily. Patient will come in on 02/17/20 for repeat Mg level. Patient stated she would be available all day to talk to provider about MR and genetic counseling. Will send message to 14/3/21 PA.

## 2020-02-03 NOTE — Telephone Encounter (Signed)
Patient calling back.   °

## 2020-02-03 NOTE — Telephone Encounter (Signed)
Patient is returning call to discuss lab results. 

## 2020-02-03 NOTE — Telephone Encounter (Signed)
Patient returning call for results 

## 2020-02-06 ENCOUNTER — Telehealth: Payer: Self-pay | Admitting: Family

## 2020-02-06 ENCOUNTER — Ambulatory Visit (INDEPENDENT_AMBULATORY_CARE_PROVIDER_SITE_OTHER): Payer: Medicare Other

## 2020-02-06 ENCOUNTER — Encounter: Payer: Self-pay | Admitting: Internal Medicine

## 2020-02-06 DIAGNOSIS — R55 Syncope and collapse: Secondary | ICD-10-CM | POA: Diagnosis not present

## 2020-02-06 NOTE — Telephone Encounter (Signed)
-----   Message from Lennon Alstrom, PA-C sent at 02/03/2020  5:15 PM EST ----- Regarding: Cardiac MRI, genetic counseling Spoke with this patient today about recommendation for cardiac MRI and referral for genetic counseling.  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    She is in agreement.  Please schedule her for cardiac MRI. Please also schedule her for genetic counseling with Brent Bulla.  Thank you!

## 2020-02-06 NOTE — Telephone Encounter (Addendum)
Order placed for Cardiac MRI. ( Msg sent to prior auth and Baldomero Lamy to schedule).  Referral placed for Genetic Counseling with Dr. Jomarie Longs at the [facility] office. (Msg sent to [facility] pcc to contact the patient to schedule).

## 2020-02-06 NOTE — Telephone Encounter (Signed)
Left message for patient to call and discuss preferred weekdays and times for scheduling the Cardiac MRI ordered by Gillian Shields, PAl

## 2020-02-07 ENCOUNTER — Telehealth: Payer: Self-pay

## 2020-02-07 ENCOUNTER — Telehealth: Payer: Self-pay | Admitting: Physician Assistant

## 2020-02-07 ENCOUNTER — Encounter: Payer: Self-pay | Admitting: Physician Assistant

## 2020-02-07 NOTE — Telephone Encounter (Addendum)
Error

## 2020-02-07 NOTE — Telephone Encounter (Signed)
Routing to provider  

## 2020-02-07 NOTE — Telephone Encounter (Signed)
Patient is scheduled for her Cardiac MRI on 03/01/20 at 1:00 pm.  Patient states she is claustrophobic and will need medication to be able to complete this study.

## 2020-02-07 NOTE — Telephone Encounter (Signed)
Spoke with patient regarding appointment scheduled for the Cardiac MRI  On 03/01/20 at 1:00 pm----arrival time is 12:30 pm at [facility] 1st floor admissions office.  WIll mail information to patient and she voiced her understanding.

## 2020-02-08 ENCOUNTER — Other Ambulatory Visit: Payer: Self-pay | Admitting: Physician Assistant

## 2020-02-08 DIAGNOSIS — F40240 Claustrophobia: Secondary | ICD-10-CM

## 2020-02-08 MED ORDER — ALPRAZOLAM 0.25 MG PO TABS: 0.2500 mg | ORAL_TABLET | Freq: Once | ORAL | 0 refills | Status: AC

## 2020-02-08 NOTE — Progress Notes (Unsigned)
1 tablet of 0.25mg  Xanax provided for claustrophobia during cMRI.

## 2020-02-13 ENCOUNTER — Telehealth: Payer: Self-pay

## 2020-02-13 NOTE — Telephone Encounter (Signed)
Was able to reach pt by mobile, she is aware that Jacquelyn PA-C has sent over Rx to her pharmacy of 0.25mg of Xanax that she may take before her MRI on 12/16. Pt verbalized understanding, no other concerns or questions at this time.  

## 2020-02-13 NOTE — Telephone Encounter (Signed)
Was able to reach pt by mobile, she is aware that Ferol Luz has sent over Rx to her pharmacy of 0.25mg  of Xanax that she may take before her MRI on 12/16. Pt verbalized understanding, no other concerns or questions at this time.

## 2020-02-14 ENCOUNTER — Encounter: Payer: Self-pay | Admitting: Oncology

## 2020-02-14 ENCOUNTER — Telehealth: Payer: Self-pay | Admitting: Oncology

## 2020-02-14 NOTE — Telephone Encounter (Signed)
Keep same schedule for now.

## 2020-02-14 NOTE — Telephone Encounter (Signed)
Called patient for additional information.  Patient reports she has been going thru cardiac work up and was found to have bilateral lung nodules on CT obtained a [facility].  She also has significant oncological family history.    Dr. Cathie Hoops, she has been scheduled to see you on 03/02/20.  Anything we need before hand?

## 2020-02-14 NOTE — Telephone Encounter (Signed)
02/14/2020  Returned pt call; she had left a voicemail saying she had some concerning test results and wanted to reach out and follow up with Dr. Cathie Hoops. Scheduled pt for a f/u on 12/17 @ 2:30, pt confirmed date and time  SRW

## 2020-02-15 ENCOUNTER — Other Ambulatory Visit: Payer: Self-pay

## 2020-02-15 ENCOUNTER — Telehealth (INDEPENDENT_AMBULATORY_CARE_PROVIDER_SITE_OTHER): Payer: Medicare Other | Admitting: Psychiatry

## 2020-02-15 ENCOUNTER — Encounter: Payer: Self-pay | Admitting: Psychiatry

## 2020-02-15 DIAGNOSIS — F1590 Other stimulant use, unspecified, uncomplicated: Secondary | ICD-10-CM

## 2020-02-15 DIAGNOSIS — F331 Major depressive disorder, recurrent, moderate: Secondary | ICD-10-CM | POA: Diagnosis not present

## 2020-02-15 DIAGNOSIS — F4310 Post-traumatic stress disorder, unspecified: Secondary | ICD-10-CM | POA: Diagnosis not present

## 2020-02-15 DIAGNOSIS — F5105 Insomnia due to other mental disorder: Secondary | ICD-10-CM

## 2020-02-15 DIAGNOSIS — F411 Generalized anxiety disorder: Secondary | ICD-10-CM

## 2020-02-15 MED ORDER — VORTIOXETINE HBR 5 MG PO TABS: 5.0000 mg | ORAL_TABLET | Freq: Every day | ORAL | 1 refills | Status: DC

## 2020-02-15 NOTE — Patient Instructions (Signed)
Vortioxetine oral tablet What is this medicine? Vortioxetine (vor tee OX e teen) is used to treat depression. This medicine may be used for other purposes; ask your health care provider or pharmacist if you have questions. COMMON BRAND NAME(S): BRINTELLIX, Trintellix What should I tell my health care provider before I take this medicine? They need to know if you have any of these conditions:  bipolar disorder or a family history of bipolar disorder  bleeding disorders  drink alcohol  glaucoma  liver disease  low levels of sodium in the blood  seizures  suicidal thoughts, plans, or attempt; a previous suicide attempt by you or a family member  take medicines that treat or prevent blood clots  an unusual or allergic reaction to vortioxetine, other medicines, foods, dyes, or preservatives  pregnant or trying to get pregnant  breast-feeding How should I use this medicine? Take this medicine by mouth with a glass of water. Follow the directions on the prescription label. You can take it with or without food. If it upsets your stomach, take it with food. Take your medicine at regular intervals. Do not take it more often than directed. Do not stop taking this medicine suddenly except upon the advice of your doctor. Stopping this medicine too quickly may cause serious side effects or your condition may worsen. A special MedGuide will be given to you by the pharmacist with each prescription and refill. Be sure to read this information carefully each time. Talk to your pediatrician regarding the use of this medicine in children. Special care may be needed. Overdosage: If you think you have taken too much of this medicine contact a poison control center or emergency room at once. NOTE: This medicine is only for you. Do not share this medicine with others. What if I miss a dose? If you miss a dose, take it as soon as you can. If it is almost time for your next dose, take only that dose. Do  not take double or extra doses. What may interact with this medicine? Do not take this medicine with any of the following medications:  linezolid  MAOIs like Carbex, Eldepryl, Marplan, Nardil, and Parnate  methylene blue (injected into a vein) This medicine may also interact with the following medications:  alcohol  aspirin and aspirin-like medicines  carbamazepine  certain medicines for depression, anxiety, or psychotic disturbances  certain medicines for migraine headache like almotriptan, eletriptan, frovatriptan, naratriptan, rizatriptan, sumatriptan, zolmitriptan  diuretics  fentanyl  furazolidone  isoniazid  medicines that treat or prevent blood clots like warfarin, enoxaparin, and dalteparin  NSAIDs, medicines for pain and inflammation, like ibuprofen or naproxen  phenytoin  procarbazine  quinidine  rasagiline  rifampin  supplements like St. John's wort, kava kava, valerian  tramadol  tryptophan This list may not describe all possible interactions. Give your health care provider a list of all the medicines, herbs, non-prescription drugs, or dietary supplements you use. Also tell them if you smoke, drink alcohol, or use illegal drugs. Some items may interact with your medicine. What should I watch for while using this medicine? Tell your doctor if your symptoms do not get better or if they get worse. Visit your doctor or health care professional for regular checks on your progress. Because it may take several weeks to see the full effects of this medicine, it is important to continue your treatment as prescribed by your doctor. Patients and their families should watch out for new or worsening thoughts of suicide or   depression. Also watch out for sudden changes in feelings such as feeling anxious, agitated, panicky, irritable, hostile, aggressive, impulsive, severely restless, overly excited and hyperactive, or not being able to sleep. If this happens,  especially at the beginning of treatment or after a change in dose, call your health care professional. You may get drowsy or dizzy. Do not drive, use machinery, or do anything that needs mental alertness until you know how this medicine affects you. Do not stand or sit up quickly, especially if you are an older patient. This reduces the risk of dizzy or fainting spells. Alcohol may interfere with the effect of this medicine. Avoid alcoholic drinks. Your mouth may get dry. Chewing sugarless gum or sucking hard candy, and drinking plenty of water may help. Contact your doctor if the problem does not go away or is severe. What side effects may I notice from receiving this medicine? Side effects that you should report to your doctor or health care professional as soon as possible:  allergic reactions like skin rash, itching or hives, swelling of the face, lips, or tongue  anxious  black, tarry stools  changes in vision  confusion  elevated mood, decreased need for sleep, racing thoughts, impulsive behavior  eye pain  fast, irregular heartbeat  feeling faint or lightheaded, falls  feeling agitated, angry, or irritable  hallucination, loss of contact with reality  loss of balance or coordination  loss of memory  painful or prolonged erections  restlessness, pacing, inability to keep still  seizures  stiff muscles  suicidal thoughts or other mood changes  trouble sleeping  unusual bleeding or bruising  unusually weak or tired  vomiting Side effects that usually do not require medical attention (report to your doctor or health care professional if they continue or are bothersome):  change in appetite or weight  change in sex drive or performance  constipation  dizziness  dry mouth  nausea This list may not describe all possible side effects. Call your doctor for medical advice about side effects. You may report side effects to FDA at 1-800-FDA-1088. Where  should I keep my medicine? Keep out of the reach of children. Store at room temperature between 15 and 30 degrees C (59 and 86 degrees F). Throw away any unused medicine after the expiration date. NOTE: This sheet is a summary. It may not cover all possible information. If you have questions about this medicine, talk to your doctor, pharmacist, or health care provider.  2020 Elsevier/Gold Standard (2015-08-02 16:45:13)  

## 2020-02-15 NOTE — Telephone Encounter (Signed)
When I talked to patient I advised that if she did not get a call with a change in appt we will keep the already scheduled appt.

## 2020-02-15 NOTE — Progress Notes (Signed)
Virtual Visit via Telephone Note  I connected with Cheyenne Gray on 02/15/20 at  9:40 AM EST by telephone and verified that I am speaking with the correct person using two identifiers.  Location Provider Location : [facility] Patient Location : Home  Participants: Patient , Provider    I discussed the limitations, risks, security and privacy concerns of performing an evaluation and management service by telephone and the availability of in person appointments. I also discussed with the patient that there may be a patient responsible charge related to this service. The patient expressed understanding and agreed to proceed   I discussed the assessment and treatment plan with the patient. The patient was provided an opportunity to ask questions and all were answered. The patient agreed with the plan and demonstrated an understanding of the instructions.   The patient was advised to call back or seek an in-person evaluation if the symptoms worsen or if the condition fails to improve as anticipated.  [facility] MD OP Progress Note  02/15/2020 12:56 PM Cheyenne Gray  MRN:  [id number]  Chief Complaint:  Chief Complaint    Follow-up     HPI: Cheyenne Gray 54 year old Caucasian female, widowed, on disability, has a history of PTSD, MDD, GAD, insomnia, caffeine use disorder, history of prolonged QT syndrome, chronic back pain on methadone, vitamin B-12 deficiency, heart failure, iron deficiency, vitamin D deficiency, history of gastric bypass, MI, hypertension was evaluated by phone today.  Patient today reports she is currently struggling with anxiety symptoms.  She worries about her health, her situational stressors, housing problems, relationship struggles.  She reports she is often restless and fidgety.  She is often overwhelmed.  When she is in public places her anxiety symptoms get worse.  She had to stop taking the Pristiq since it gave her bad side effects.  Since stopping the Pristiq few weeks  ago she has not had any significant depressive symptoms.  She reports the Pristiq made her to be socially withdrawn as well as also gave her suicidal thoughts.  She currently denies any suicidality.  She reports sleep is overall okay.  Patient reports she was recently diagnosed with cardiac problems and the genetic testing showed that she may have hypertrophic cardiomyopathy.  She continues to be under the care of cardiologist and is currently undergoing management for the same.  Patient denies any homicidality or perceptual disturbances.  Patient reports she continues to be motivated to stay in therapy since they are beneficial.    Visit Diagnosis:    ICD-10-CM   1. PTSD (post-traumatic stress disorder)  F43.10   2. GAD (generalized anxiety disorder)  F41.1 vortioxetine HBr (TRINTELLIX) 5 MG TABS tablet  3. MDD (major depressive disorder), recurrent episode, moderate (HCC)  F33.1   4. Insomnia due to mental condition  F51.05   5. Caffeine use disorder  F15.90     Past Psychiatric History: I have reviewed past psychiatric history from my progress note on 10/17/2017.  Past trials of Paxil, Cymbalta, Valium, melatonin, trazodone, Ambien, Wellbutrin  Past Medical History:  Past Medical History:  Diagnosis Date  . (HFpEF) heart failure with preserved ejection fraction ([place])    a. Echo 2014: EF 65-70%, nl WM, mildly dilated LA, PASP nl; b. 12/2014 Echo: EF 65-70%, no rwma, mod septal hypertrophy w/o LVOT gradient or SAM; c. 07/2017 Echo: EF 55-60%, no rwma, mildly dil RV w/ nl syst fxn. Mildly dil RA. Dilated IVC w/ elevated CVP. Triv post effusion.  Marland Kitchen  Anxiety   . Arthritis   . Asthma   . B12 deficiency   . Chronic back pain   . Chronic headaches   . Chronic pain    a. on methadone  . Concussion    hx of 4  . Coronary artery disease, non-occlusive    a. LHC 1/18: proximal to mid LAD 40% stenosed, mid LAD 30% stenosed, mid RCA 20% stenosed, distal RCA 20% stenosed, EF 55-65%, LVEDP  normal  . Depression   . DJD (degenerative joint disease), multiple sites   . Frequent headaches   . Gallstone   . H/O non-ST elevation myocardial infarction (NSTEMI)   . Hand, foot and mouth disease 2016  . History of shingles   . Hypertension   . Iron deficiency anemia   . Long QT interval   . Methadone use    managed by Dr. Primus Bravo  . Migraine    1x/mo  . Motion sickness    cars  . Obesity   . Palpitations    a. 24 hour Holter: NSR, sinus brady down to 48, occasional PVCs & couplets, 8 beats NSVT; b. 30 day event monitor 2015: NSR with rare PVC.  Marland Kitchen Psoriasis   . Syncope and collapse   . Vitamin D deficiency   . Wears dentures    full upper and lower    Past Surgical History:  Procedure Laterality Date  . ABDOMINOPLASTY     tummy tuck ? year   . [place] SURGERY  2001  . CARDIAC CATHETERIZATION Left 04/16/2016   Procedure: Left Heart Cath and Coronary Angiography;  Surgeon: [name], MD;  Location: [facility];  Service: Cardiovascular;  Laterality: Left;  . CHOLECYSTECTOMY  2001  . COLONOSCOPY WITH PROPOFOL N/A 09/28/2018   Procedure: COLONOSCOPY WITH PROPOFOL;  Surgeon: Virgel Manifold, MD;  Location: [facility];  Service: Endoscopy;  Laterality: N/A;  . ESOPHAGOGASTRODUODENOSCOPY (EGD) WITH PROPOFOL N/A 09/28/2018   Procedure: ESOPHAGOGASTRODUODENOSCOPY (EGD) WITH BIOPSY;  Surgeon: Virgel Manifold, MD;  Location: [facility];  Service: Endoscopy;  Laterality: N/A;  . GALLBLADDER SURGERY    . GASTRIC BYPASS  2001  . GASTROPLASTY      Family Psychiatric History: I have reviewed family psychiatric history from my progress note on 10/17/2017  Family History:  Family History  Problem Relation Age of Onset  . Heart attack Mother   . Cancer Mother        pancreatitic 67  . Early death Mother   . Heart attack Father 72       MI  . Early death Father   . Heart disease Father   . Heart attack Brother   . Heart disease Brother    . Arthritis Brother   . Depression Brother   . Diabetes Brother   . Heart attack Maternal Grandmother   . Cancer Maternal Grandmother        pancreatitic   . Heart disease Maternal Grandmother   . Lung cancer Maternal Grandmother   . Pancreatic cancer Maternal Grandmother   . Cancer Maternal Uncle        pancreatitic   . Cancer Paternal Grandmother        ? type   . Diabetes Paternal Grandmother   . Cancer Maternal Uncle        pancreatitic   . Breast cancer Maternal Aunt   . Cancer Maternal Aunt        GYN  . Cancer Maternal Aunt  GYN    Social History: Reviewed social history from my progress note on 10/17/2017 Social History   Socioeconomic History  . Marital status: Widowed    Spouse name: Not on file  . Number of children: 1  . Years of education: assoc degree  . Highest education level: Associate degree: occupational, Hotel manager, or vocational program  Occupational History  . Not on file  Tobacco Use  . Smoking status: Never Smoker  . Smokeless tobacco: Never Used  Vaping Use  . Vaping Use: Never used  Substance and Sexual Activity  . Alcohol use: No    Alcohol/week: 0.0 standard drinks    Comment: holidays  . Drug use: No  . Sexual activity: Not Currently  Other Topics Concern  . 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 Difficulty of Paying Living Expenses: Not on file  Food Insecurity:   . Worried About Charity fundraiser in the Last Year: Not on file  . Ran Out of Food in the Last Year: Not on file  Transportation Needs:   . Lack of Transportation (Medical): Not on file  . Lack of Transportation (Non-Medical): Not on file  Physical Activity:    . Days of Exercise per Week: Not on file  . Minutes of Exercise per Session: Not on file  Stress:   . Feeling of Stress : Not on file  Social Connections:   . Frequency of Communication with Friends and Family: Not on file  . Frequency of Social Gatherings with Friends and Family: Not on file  . Attends Religious Services: Not on file  . Active Member of Clubs or Organizations: Not on file  . Attends Archivist Meetings: Not on file  . Marital Status: Not on file    Allergies:  Allergies  Allergen Reactions  . Covid-19 ([facility]) Vaccine Therapist, music) [Covid-19 (Mrna) Vaccine] Itching, Palpitations and Other (See Comments)    Throat closing.   Marland Kitchen Penicillins Hives, Shortness Of Breath and Rash    Has patient had a PCN reaction causing immediate rash, facial/tongue/throat swelling, SOB or lightheadedness with hypotension: Yes Has patient had a PCN reaction causing severe rash involving mucus membranes or skin necrosis: No Has patient had a PCN reaction that required hospitalization No Has patient had a PCN reaction occurring within the last 10 years: No If all of the above answers are "NO", then may proceed with Cephalosporin use.   Cheyenne Gray     Metabolic Disorder Labs: Lab Results  Component Value Date   HGBA1C 5.3 09/01/2018   No results found for: PROLACTIN Lab Results  Component Value Date   CHOL 114 12/07/2019   TRIG 63 12/07/2019   HDL 54 12/07/2019   CHOLHDL 2.1 12/07/2019   VLDL 13 12/07/2019   LDLCALC 47 12/07/2019   LDLCALC 95 09/01/2018   Lab Results  Component Value Date   TSH 3.915 12/07/2019   TSH 3.250 11/09/2019    Therapeutic Level Labs: No results found for: LITHIUM No results found for: VALPROATE No components found for:  CBMZ  Current Medications: Current Outpatient  Medications  Medication Sig Dispense Refill  . famotidine (PEPCID) 20 MG tablet Take by mouth.    Marland Kitchen albuterol (VENTOLIN HFA) 108 (90 Base) MCG/ACT inhaler Inhale 1-2 puffs  into the lungs as needed for wheezing. 18 g 11  . ARIPiprazole (ABILIFY) 2 MG tablet Take 1.5 tablets (3 mg total) by mouth daily with breakfast. 135 tablet 0  . carvedilol (COREG) 6.25 MG tablet Take 1 tablet (6.25 mg total) by mouth 2 (two) times daily. 60 tablet 5  . clotrimazole (CLOTRIMAZOLE AF) 1 % cream Apply 1 application topically 2 (two) times daily. Inner thigh 60 g 12  . diclofenac sodium (VOLTAREN) 1 % GEL Apply 4 g topically 4 (four) times daily. Prn knees and 2 grams qid prn hands arthritis 100 g 11  . EPINEPHrine 0.3 mg/0.3 mL IJ SOAJ injection Inject 0.3 mLs (0.3 mg total) into the muscle as needed for anaphylaxis. 2 each 0  . furosemide (LASIX) 20 MG tablet Take 1 tablet (20 mg total) by mouth daily. 35 tablet 2  . gabapentin (NEURONTIN) 400 MG capsule Limit 2 tablets in the a.m. and midday and 3 tablets each evening 630 capsule 3  . isosorbide mononitrate (IMDUR) 60 MG 24 hr tablet Take 1 tablet (60 mg total) by mouth daily. 90 tablet 2  . Magnesium Oxide 400 MG CAPS Take 1 capsule (400 mg total) by mouth daily. 30 capsule 6  . methadone (DOLOPHINE) 10 MG tablet Limit 1-2 tablets by mouth 2-3 times per day if tolerated 180 tablet 0  . nitroGLYCERIN (NITROSTAT) 0.4 MG SL tablet Place 1 tablet (0.4 mg total) under the tongue every 5 (five) minutes as needed for chest pain. 25 tablet 3  . potassium chloride (KLOR-CON) 10 MEQ tablet Take 1 tablet (10 mEq total) by mouth daily. 30 tablet 2  . rosuvastatin (CRESTOR) 10 MG tablet Take 1 tablet (10 mg total) by mouth daily. 90 tablet 3  . tiZANidine (ZANAFLEX) 4 MG tablet TAKE 1 TABLET BY MOUTH AT BEDTIME AS NEEDED FOR MUSCLE SPASMS. 90 tablet 1  . triamcinolone cream (KENALOG) 0.1 % Apply 1 application topically 2 (two) times daily. 80 g 0  . vitamin B-12 (CYANOCOBALAMIN) 500 MCG tablet Take by mouth.    . vortioxetine HBr (TRINTELLIX) 5 MG TABS tablet Take 1 tablet (5 mg total) by mouth daily. 30 tablet 1   No current  facility-administered medications for this visit.     Musculoskeletal: Strength & Muscle Tone: UTA Gait & Station: UTA Patient leans: N/A  Psychiatric Specialty Exam: Review of Systems  Psychiatric/Behavioral: The patient is nervous/anxious.   All other systems reviewed and are negative.   Last menstrual period 02/18/2016.There is no height or weight on file to calculate BMI.  General Appearance: UTA  Eye Contact:  UTA  Speech:  Clear and Coherent  Volume:  Normal  Mood:  Anxious  Affect:  UTA  Thought Process:  Goal Directed and Descriptions of Associations: Intact  Orientation:  Full (Time, Place, and Person)  Thought Content: Logical   Suicidal Thoughts:  No  Homicidal Thoughts:  No  Memory:  Immediate;   Fair Recent;   Fair Remote;   Fair  Judgement:  Fair  Insight:  Fair  Psychomotor Activity:  UTA  Concentration:  Concentration: Fair and Attention Span: Fair  Recall:  [employer] of Knowledge: Fair  Language: Fair  Akathisia:  No  Handed:  Right  AIMS (if indicated): UTA  Assets:  Communication Skills Desire for  Improvement Housing Social Support  ADL's:  Intact  Cognition: WNL  Sleep:  Fair   Screenings: GAD-7     Video Visit from 02/15/2020 in [facility] Office Visit from 07/26/2019 in [facility] Office Visit from 09/21/2015 in [facility]  Total GAD-7 Score _0 Mini-Mental     Office Visit from 10/30/2017 in [facility]  Total Score (max 30 points ) 27    PHQ2-9     Video Visit from 02/15/2020 in [facility] Office Visit from 07/26/2019 in [facility] Office Visit from 12/21/2018 in [facility] Office Visit from 08/18/2017 in [facility] Office Visit from 11/15/2015 in [facility]  PHQ-2 Total Score 1 5 0 0 0   PHQ-9 Total Score 3 19 -- -- --       Assessment and Plan: Cheyenne Gray is a 54 year old Caucasian female, widowed, has a history of PTSD, depression, anxiety, multiple medical problems including coronary artery disease, prolonged QT per history, PVC, gastric bypass, vitamin B12 deficiency, vitamin D deficiency, chronic pain on methadone was evaluated by phone today.  Patient continues to have multiple psychosocial stressors, health problems, continues to struggle with anxiety symptoms.  Plan as noted below.  Plan PTSD-stable We will monitor closely  MDD-stable PHQ 9 today equals 3 We will monitor closely Continue Abilify 3 mg p.o. daily Continue CBT  GAD-unstable GAD 7 equals 19 Start Trintellix 5 mg p.o. daily Discontinue Pristiq for side effects and noncompliance. 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  Follow-up in clinic in 3 to 4 weeks or sooner if needed.  I have spent atleast 20 minutes non face to face  with patient today. More than 50 % of the time was spent for preparing to see the patient ( e.g., review of test, records ), obtaining and to review and separately obtained history , ordering medications and test ,psychoeducation and supportive psychotherapy and care coordination,as well as documenting clinical information in electronic health record. This note was generated in part or whole with voice recognition software. Voice recognition is usually quite accurate but there are transcription errors that can and very often do occur. I apologize for any  typographical errors that were not detected and corrected.      Ursula Alert, MD 02/15/2020, 12:56 PM

## 2020-02-16 ENCOUNTER — Encounter: Payer: Self-pay | Admitting: Internal Medicine

## 2020-02-16 ENCOUNTER — Ambulatory Visit (INDEPENDENT_AMBULATORY_CARE_PROVIDER_SITE_OTHER): Payer: Medicare Other

## 2020-02-16 DIAGNOSIS — R002 Palpitations: Secondary | ICD-10-CM

## 2020-02-16 DIAGNOSIS — R55 Syncope and collapse: Secondary | ICD-10-CM | POA: Diagnosis not present

## 2020-02-21 ENCOUNTER — Telehealth: Payer: Self-pay

## 2020-02-21 NOTE — Telephone Encounter (Signed)
Dr.Gollan stated it was okay for patient to take in addition to her Methadone. Called pharmacy and approved the prescription. Called patient and informed her the hold was released so she could pick it up now.

## 2020-02-21 NOTE — Telephone Encounter (Signed)
Noted. Thank you for the update.

## 2020-02-21 NOTE — Telephone Encounter (Signed)
Patient sent a MyChart message as copied and pasted below: [name], [name]  P [facility] Triage Hello I need to see if Cheyenne Gray can call [facility] please. I went to pick up my medication and they said that they need her to [facility].   Thank you Cheyenne Gray  [phone]   I called [facility] and they stated that patients insurance would not cover the xanax because she is also on Methadone. They wanted to clarify with [name] if it is okay to still prescribe and the patient could then pay cash for it.

## 2020-02-22 ENCOUNTER — Other Ambulatory Visit: Payer: Self-pay

## 2020-02-22 DIAGNOSIS — R55 Syncope and collapse: Secondary | ICD-10-CM

## 2020-02-23 ENCOUNTER — Telehealth: Payer: Self-pay | Admitting: *Deleted

## 2020-02-23 ENCOUNTER — Ambulatory Visit (INDEPENDENT_AMBULATORY_CARE_PROVIDER_SITE_OTHER): Payer: Medicare Other | Admitting: Gastroenterology

## 2020-02-23 ENCOUNTER — Telehealth: Payer: Self-pay

## 2020-02-23 ENCOUNTER — Ambulatory Visit: Payer: Medicare Other | Admitting: Genetic Counselor

## 2020-02-23 ENCOUNTER — Encounter: Payer: Self-pay | Admitting: Gastroenterology

## 2020-02-23 ENCOUNTER — Other Ambulatory Visit: Payer: Self-pay

## 2020-02-23 VITALS — BP 121/82 | HR 63 | Temp 97.3°F | Ht 66.0 in | Wt 251.0 lb

## 2020-02-23 DIAGNOSIS — R55 Syncope and collapse: Secondary | ICD-10-CM

## 2020-02-23 DIAGNOSIS — R9389 Abnormal findings on diagnostic imaging of other specified body structures: Secondary | ICD-10-CM | POA: Diagnosis not present

## 2020-02-23 DIAGNOSIS — R1310 Dysphagia, unspecified: Secondary | ICD-10-CM | POA: Diagnosis not present

## 2020-02-23 DIAGNOSIS — K219 Gastro-esophageal reflux disease without esophagitis: Secondary | ICD-10-CM | POA: Diagnosis not present

## 2020-02-23 DIAGNOSIS — R9431 Abnormal electrocardiogram [ECG] [EKG]: Secondary | ICD-10-CM

## 2020-02-23 NOTE — Telephone Encounter (Signed)
-----   Message from Lennon Alstrom, PA-C sent at 02/23/2020  1:17 PM EST ----- Monitored for 9 days after syncopal event: --1 run of a faster heart rhythm from the bottom part of the heart, which only lasted for 5 beats with maximum rate of 160 bpm (ventricular tachycardia). --1 run of a faster heart rhythm from the top part of the heart, lasting only 10.2 seconds with maximum rate 156 bpm.  (Supraventricular tachycardia). --Rare extra beats from the top part of the heart (PACs), rarely occurring twice or 3 times in a row. --Frequent extra beats from the bottom part of the heart (PVCs), rarely occurring twice or 3 times in a row.  Also noted were episodes of extra beats from the bottom part of the heart every other beat and every third beat.  Triggered events were associated with PVCs and normal heart rhythm. Recommend continue with plan to obtain cardiac MRI. Given her monitor results, genetic test results, and recent syncope, recommend establish with EP.

## 2020-02-23 NOTE — Telephone Encounter (Signed)
Cardiac clearance form was faxed to Mrs. Marisue Ivan, PA-C. Awaiting on response.

## 2020-02-23 NOTE — Patient Instructions (Signed)
We will try to get cardiac clearance before we could schedule your EGD and colonoscopy. We will call you and let you know.  Please take 8.5 grams of Miralax daily.

## 2020-02-23 NOTE — Telephone Encounter (Signed)
Spoke to pt. Notified of results and recc below. Pt will proceed with cardiac MRI that is scheduled 03/01/20.  Referral placed for EP. Pt prefers to be seen in our [place] office. Message routed to scheduling.  Notified pt our office will call her to schedule appt w/ EP. Pt verbalized understanding and has no further questions at this time.

## 2020-02-24 ENCOUNTER — Telehealth: Payer: Self-pay | Admitting: Cardiovascular Disease

## 2020-02-24 NOTE — Telephone Encounter (Signed)
   [facility] Pre-operative Risk Assessment    HEARTCARE STAFF: - Please ensure there is not already an duplicate clearance open for this procedure. - Under Visit Info/Reason for Call, type in Other and utilize the format Clearance MM/DD/YY or Clearance TBD. Do not use dashes or single digits. - If request is for dental extraction, please clarify the # of teeth to be extracted.  Request for surgical clearance:  1. What type of surgery is being performed? egd and colonoscopy    2. When is this surgery scheduled? tbd   3. What type of clearance is required (medical clearance vs. Pharmacy clearance to hold med vs. Both)? medical  4. Are there any medications that need to be held prior to surgery and how long?not noted    5. Practice name and name of physician performing surgery? [facility] Dr. Joaquin [place]    6. What is the office phone number? [phone]   7.   What is the office fax number? [phone]  8.   Anesthesia type (None, local, MAC, general) ? Not noted    Clarisse Gouge 02/24/2020, 2:59 PM  _________________________________________________________________   (provider comments below)

## 2020-02-24 NOTE — Progress Notes (Signed)
Melodie Bouillon, MD [address]  [address]  [place], [place] [address]  Main: [phone]  Fax: [phone]   Primary Care Physician: McLean-Scocuzza, Pasty Spillers, MD   Chief complaint: Reflux  HPI: Cheyenne Gray is a 54 y.o. female previously seen for iron deficiency anemia with history of gastric bypass, dysphagia and reflux.  Manometry was recommended at [facility], but patient did not follow-up with them.  Patient recently went to the ER at [facility] with substernal chest pain with ER notes stating the patient's high sensitive was negative and she had a reassuring EKG.  CT was negative for PE.  CT reported patulous fluid-filled esophagus mild intra and extrahepatic biliary duct dilation with postcholecystectomy status was reported.  Patient is wearing a heart monitor today and states she is undergoing cardiology work-up, and also has an upcoming appointment with oncology due to pulmonary nodule seen on the CT scan.  Previous history:  EGD in July 2020 with hypertonic lower esophageal sphincter noted.  Anastomosis with healthy-appearing mucosa.  Mildly friable mucosa at anastomosis seen. Normal jejunum.  Pathology did not show EOE  Colonoscopy, July 2020 With poor prep.  Repeat recommended in 3 months.  However, no cancers, masses or any findings to explain the CT thickening noted in the transverse colon on the previous CT.  Therefore CT thickening likely nonspecific  Previous history: Reports an ER visit2018due to food impaction. ER notes reviewed, did reveal the patient was given glucagon, and felt better, and was discharged from the ER. She had an EGD in 2015 due to melena, anemia and abdominal pain. This was done by Dr. Dow Adolph.ulceration was noted at the gastro jejunal anastomosis. Patient was given sucralfate. Stomach biopsies did not reveal H. Pylori.  Reports history of colonoscopy prior to her gastric bypass 15 or 16 years ago as well. No recent colonoscopies.  No immediate family history of colon cancer.  Reports alternating constipation and diarrhea. States has some days with hard balls of stool, and others with loose stools.  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 albuterol (VENTOLIN HFA) 108 (90 Base) MCG/ACT inhaler Inhale 1-2 puffs into the lungs as needed for wheezing. 18 g 11  . carvedilol (COREG) 6.25 MG tablet Take 1 tablet (6.25 mg total) by mouth 2 (two) times daily. 60 tablet 5  . clotrimazole (CLOTRIMAZOLE AF) 1 % cream Apply 1 application topically 2 (two) times daily. Inner thigh 60 g 12  . diclofenac sodium (VOLTAREN) 1 % GEL Apply 4 g topically 4 (four) times daily. Prn knees and 2 grams qid prn hands arthritis 100 g 11  . EPINEPHrine 0.3 mg/0.3 mL IJ SOAJ injection Inject 0.3 mLs (0.3 mg total) into the muscle as needed for anaphylaxis. 2 each 0  . famotidine (PEPCID) 20 MG tablet Take by mouth.    . gabapentin (NEURONTIN) 400 MG capsule Limit 2 tablets in the a.m. and midday and 3 tablets each evening 630 capsule 3  . isosorbide mononitrate (IMDUR) 60 MG 24 hr tablet Take 1 tablet (60 mg total) by mouth daily. 90 tablet 2  . Magnesium Oxide 400 MG CAPS Take 1 capsule (400 mg total) by mouth daily. 30 capsule 6  . methadone (DOLOPHINE) 10 MG tablet Limit 1-2 tablets  by mouth 2-3 times per day if tolerated 180 tablet 0  . nitroGLYCERIN (NITROSTAT) 0.4 MG SL tablet Place 1 tablet (0.4 mg total) under the tongue every 5 (five) minutes as needed for chest pain. 25 tablet 3  . rosuvastatin (CRESTOR) 10 MG tablet Take 1 tablet (10 mg total) by mouth daily. 90 tablet 3  . tiZANidine (ZANAFLEX) 4 MG tablet TAKE 1 TABLET BY MOUTH AT BEDTIME AS NEEDED FOR MUSCLE SPASMS. 90 tablet 1  .  triamcinolone cream (KENALOG) 0.1 % Apply 1 application topically 2 (two) times daily. 80 g 0  . vitamin B-12 (CYANOCOBALAMIN) 500 MCG tablet Take by mouth.    . vortioxetine HBr (TRINTELLIX) 5 MG TABS tablet Take 1 tablet (5 mg total) by mouth daily. 30 tablet 1  . ARIPiprazole (ABILIFY) 2 MG tablet Take 1.5 tablets (3 mg total) by mouth daily with breakfast. (Patient not taking: Reported on 02/23/2020) 135 tablet 0  . furosemide (LASIX) 20 MG tablet Take 1 tablet (20 mg total) by mouth daily. (Patient not taking: Reported on 02/23/2020) 35 tablet 2  . potassium chloride (KLOR-CON) 10 MEQ tablet Take 1 tablet (10 mEq total) by mouth daily. (Patient not taking: Reported on 02/23/2020) 30 tablet 2   No current facility-administered medications for this visit.    Allergies as of 02/23/2020 - Review Complete 02/23/2020  Allergen Reaction Noted  . Covid-19 (mrna) vaccine (pfizer) [covid-19 (mrna) vaccine] Itching, Palpitations, and Other (See Comments) 07/26/2019  . Penicillins Hives, Shortness Of Breath, and Rash 07/20/2009  . Bee venom  12/21/2018    ROS:  General: Negative for anorexia, weight loss, fever, chills, fatigue, weakness. ENT: Negative for hoarseness, difficulty swallowing , nasal congestion. CV: Negative for chest pain, angina, palpitations, dyspnea on exertion, peripheral edema.  Respiratory: Negative for dyspnea at rest, dyspnea on exertion, cough, sputum, wheezing.  GI: See history of present illness. GU:  Negative for dysuria, hematuria, urinary incontinence, urinary frequency, nocturnal urination.  Endo: Negative for unusual weight change.    Physical Examination:   BP 121/82   Pulse 63   Temp (!) 97.3 F (36.3 C) (Oral)   Ht 5\' 6"  (1.676 m)   Wt 251 lb (113.9 kg)   LMP 02/18/2016 (Approximate) Comment: neg HCG-03/26/2016  BMI 40.51 kg/m   General: Well-nourished, well-developed in no acute distress.  Eyes: No icterus. Conjunctivae pink. Mouth: Oropharyngeal  mucosa moist and pink , no lesions erythema or exudate. Neck: Supple, Trachea midline Abdomen: Bowel sounds are normal, nontender, nondistended, no hepatosplenomegaly or masses, no abdominal bruits or hernia , no rebound or guarding.   Extremities: No lower extremity edema. No clubbing or deformities. Neuro: Alert and oriented x 3.  Grossly intact. Skin: Warm and dry, no jaundice.   Psych: Alert and cooperative, normal mood and affect.   Labs: CMP     Component Value Date/Time   NA 142 02/02/2020 1103   K 4.2 02/02/2020 1103   CL 101 02/02/2020 1103   CO2 26 02/02/2020 1103   GLUCOSE 100 (H) 02/02/2020 1103   GLUCOSE 91 12/07/2019 1106   BUN 9 02/02/2020 1103   CREATININE 0.96 02/02/2020 1103   CREATININE 0.86 08/18/2017 0954   CALCIUM 9.3 02/02/2020 1103   PROT 6.7 12/07/2019 1106   PROT 6.3 05/31/2018 1144   PROT 6.4 05/04/2013 1556   ALBUMIN 3.5 12/07/2019 1106   ALBUMIN 4.0 05/31/2018 1144   ALBUMIN 3.1 (L) 05/04/2013 1556   AST 29 12/07/2019 1106   AST 38 (H)  05/04/2013 1556   ALT 21 12/07/2019 1106   ALT 39 05/04/2013 1556   ALKPHOS 107 12/07/2019 1106   ALKPHOS 84 05/04/2013 1556   BILITOT 0.7 12/07/2019 1106   BILITOT 0.4 05/31/2018 1144   BILITOT 0.3 05/04/2013 1556   GFRNONAA 68 02/02/2020 1103   GFRNONAA 79 08/18/2017 0954   GFRAA 79 02/02/2020 1103   GFRAA 92 08/18/2017 0954   Lab Results  Component Value Date   WBC 9.9 02/02/2020   HGB 13.3 02/02/2020   HCT 39.1 02/02/2020   MCV 91 02/02/2020   PLT 234 02/02/2020    Imaging Studies: LONG TERM MONITOR-LIVE TELEMETRY (3-14 DAYS)  Result Date: 02/22/2020 9-day event monitor Normal sinus rhythm avg HR of 63 bpm. 1 run of Ventricular Tachycardia occurred lasting 5 beats with a max rate of 160 bpm (avg 135 bpm). (Not patient triggered) 1 run of Supraventricular Tachycardia occurred lasting 10.2 secs with a max rate of 156 bpm (avg 126 bpm). Isolated SVEs were rare (<1.0%), SVE Couplets were rare (<1.0%),  and SVE Triplets were rare (<1.0%). Isolated VEs were frequent (8.8%, 74588), VE Couplets were rare (<1.0%, 1094), and VE Triplets were rare (<1.0%, 174). Ventricular Bigeminy and Trigeminy were present. (Not patient triggered) 104 patient triggered events Often associated with PVCs, other times associated with normal sinus rhythm Signed, Dossie Arbour, MD, Ph.D [facility]    Assessment and Plan:   ZAHIRA BRUMMOND is a 54 y.o. y/o female with history of iron deficiency anemia, due to previous gastric bypass, reflux here for follow-up  Patient reports her previous symptoms of dysphagia have resolved However, her CT showed patulous fluid-filled esophagus and she has had atypical chest pain despite PPI  It would be best to proceed with upper endoscopy to evaluate abnormality on CT scan.  However, this is not urgent given her previous upper endoscopy is being reassuring.  However, given hypertonic LES noted on upper endoscopy and patient did not get manometry done since then, it would be best visualized and reassess and then referred for manometry as needed  However, patient will need cardiology clearance given her ongoing cardiology work-up prior to the procedure  Due to poor prep previously she would also benefit from colonoscopy  Patient also would like an ENT referral as she states she swallows loudly.  When she swallowed in front of me, it did not appear particularly abnormal to me, but will refer to ENT    Dr Melodie Bouillon

## 2020-02-24 NOTE — Telephone Encounter (Signed)
Patient was called and asked to go [place] to see him sooner. Referral has been updated

## 2020-02-27 ENCOUNTER — Other Ambulatory Visit: Payer: Self-pay

## 2020-02-27 ENCOUNTER — Ambulatory Visit (INDEPENDENT_AMBULATORY_CARE_PROVIDER_SITE_OTHER): Payer: Medicare Other | Admitting: Licensed Clinical Social Worker

## 2020-02-27 DIAGNOSIS — F4310 Post-traumatic stress disorder, unspecified: Secondary | ICD-10-CM | POA: Diagnosis not present

## 2020-02-27 DIAGNOSIS — F411 Generalized anxiety disorder: Secondary | ICD-10-CM

## 2020-02-27 NOTE — Telephone Encounter (Signed)
Patient is undergoing workup for syncope, including cardiac MRI scheduled for 03/01/20. Will await results.
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
## 2020-02-28 ENCOUNTER — Telehealth (HOSPITAL_COMMUNITY): Payer: Self-pay | Admitting: Emergency Medicine

## 2020-02-28 NOTE — Telephone Encounter (Signed)
Pt returning phone call regarding upcoming cardiac imaging study; pt verbalizes understanding of appt date/time, parking situation and where to check in, and verified current allergies; name and call back number provided for further questions should they arise Rockwell Alexandria RN Navigator Cardiac Imaging [facility] [phone] office [phone] cell   Pt to take 0.25mg  xanax 1 hr prior to MRI for claustro  Pt denies implants   [place] Dec

## 2020-02-28 NOTE — Telephone Encounter (Signed)
Attempted to call patient regarding upcoming cardiac MR appointment. Left message on voicemail with name and callback number Jaslen Adcox RN Navigator Cardiac Imaging [facility] [phone] Office [phone] Cell  

## 2020-02-29 NOTE — Telephone Encounter (Signed)
EGD-Abnormal CT Scan  R93.89 Colonoscopy- Colonoscopy screening  Z12.11

## 2020-03-01 ENCOUNTER — Other Ambulatory Visit: Payer: Self-pay

## 2020-03-01 ENCOUNTER — Other Ambulatory Visit: Payer: Self-pay | Admitting: Family

## 2020-03-01 ENCOUNTER — Ambulatory Visit (HOSPITAL_COMMUNITY)
Admission: RE | Admit: 2020-03-01 | Discharge: 2020-03-01 | Disposition: A | Discharge status: Home / Self Care | Payer: Medicare Other | Source: Ambulatory Visit | Attending: Physician Assistant | Admitting: Physician Assistant

## 2020-03-01 DIAGNOSIS — R55 Syncope and collapse: Secondary | ICD-10-CM

## 2020-03-01 DIAGNOSIS — I2510 Atherosclerotic heart disease of native coronary artery without angina pectoris: Secondary | ICD-10-CM

## 2020-03-01 DIAGNOSIS — I422 Other hypertrophic cardiomyopathy: Secondary | ICD-10-CM

## 2020-03-01 MED ORDER — GADOBUTROL 1 MMOL/ML IV SOLN
10.0000 mL | Freq: Once | INTRAVENOUS | Status: AC | PRN
  Administered 2020-03-01: 10 mL via INTRAVENOUS

## 2020-03-02 ENCOUNTER — Encounter: Payer: Self-pay | Admitting: Oncology

## 2020-03-02 ENCOUNTER — Inpatient Hospital Stay: Payer: Medicare Other | Attending: Oncology | Admitting: Oncology

## 2020-03-02 VITALS — BP 115/79 | HR 53 | Temp 96.7°F | Resp 18 | Wt 243.8 lb

## 2020-03-02 DIAGNOSIS — E538 Deficiency of other specified B group vitamins: Secondary | ICD-10-CM | POA: Diagnosis present

## 2020-03-02 DIAGNOSIS — Z801 Family history of malignant neoplasm of trachea, bronchus and lung: Secondary | ICD-10-CM | POA: Insufficient documentation

## 2020-03-02 DIAGNOSIS — I5032 Chronic diastolic (congestive) heart failure: Secondary | ICD-10-CM | POA: Insufficient documentation

## 2020-03-02 DIAGNOSIS — I252 Old myocardial infarction: Secondary | ICD-10-CM | POA: Diagnosis not present

## 2020-03-02 DIAGNOSIS — D529 Folate deficiency anemia, unspecified: Secondary | ICD-10-CM | POA: Diagnosis not present

## 2020-03-02 DIAGNOSIS — Z803 Family history of malignant neoplasm of breast: Secondary | ICD-10-CM | POA: Insufficient documentation

## 2020-03-02 DIAGNOSIS — I110 Hypertensive heart disease with heart failure: Secondary | ICD-10-CM | POA: Diagnosis not present

## 2020-03-02 DIAGNOSIS — Z9884 Bariatric surgery status: Secondary | ICD-10-CM | POA: Diagnosis not present

## 2020-03-02 DIAGNOSIS — Z9049 Acquired absence of other specified parts of digestive tract: Secondary | ICD-10-CM | POA: Insufficient documentation

## 2020-03-02 DIAGNOSIS — Z8049 Family history of malignant neoplasm of other genital organs: Secondary | ICD-10-CM | POA: Insufficient documentation

## 2020-03-02 DIAGNOSIS — Z800 Family history of malignant neoplasm of digestive organs: Secondary | ICD-10-CM | POA: Diagnosis not present

## 2020-03-02 DIAGNOSIS — F32A Depression, unspecified: Secondary | ICD-10-CM | POA: Insufficient documentation

## 2020-03-02 DIAGNOSIS — Z8261 Family history of arthritis: Secondary | ICD-10-CM | POA: Diagnosis not present

## 2020-03-02 DIAGNOSIS — F419 Anxiety disorder, unspecified: Secondary | ICD-10-CM | POA: Insufficient documentation

## 2020-03-02 DIAGNOSIS — Z833 Family history of diabetes mellitus: Secondary | ICD-10-CM | POA: Insufficient documentation

## 2020-03-02 DIAGNOSIS — J45909 Unspecified asthma, uncomplicated: Secondary | ICD-10-CM | POA: Diagnosis not present

## 2020-03-02 DIAGNOSIS — I2510 Atherosclerotic heart disease of native coronary artery without angina pectoris: Secondary | ICD-10-CM | POA: Diagnosis not present

## 2020-03-02 DIAGNOSIS — Z79899 Other long term (current) drug therapy: Secondary | ICD-10-CM | POA: Diagnosis not present

## 2020-03-02 DIAGNOSIS — D508 Other iron deficiency anemias: Secondary | ICD-10-CM | POA: Insufficient documentation

## 2020-03-02 DIAGNOSIS — Z8249 Family history of ischemic heart disease and other diseases of the circulatory system: Secondary | ICD-10-CM | POA: Insufficient documentation

## 2020-03-02 NOTE — Progress Notes (Signed)
Patient here for follow up. Reports having major cardiac issues and is now being followed by cardiologist.

## 2020-03-02 NOTE — Progress Notes (Signed)
Hematology/Oncology  Follow up note [facility] [phone] [phone]   Patient Care Team: McLean-Scocuzza, Nino Glow, MD as PCP - General (Internal Medicine) [name], MD as PCP - Cardiology (Cardiology) Donnie Coffin, MD as Referring Physician (Family Medicine) Earlie Server, MD as Consulting Physician (Hematology and Oncology)  REASON FOR VISIT Follow up for treatment of anemia.  HISTORY OF PRESENTING ILLNESS:  This is a patient who used to follow up with Dr. Ma Hillock presents for follow-up of management of her anemia.   History of gastric bypass in 2001 Patient was last seen by Dr. Ma Hillock in July 2016 for iron deficiency.  She was again being seen by nurse practitioner on August 21, 2015 for follow-up of her iron deficiency anemia.  She has a history of iron deficiency anemia remote gastric bypass history in 2001.  She used to IV iron infusion with venofer 125m Every 3 months but she has a follow[place]up with [place].  Patient reports feeling fatigued.  She did not have much of appetite.  Denies any nausea vomiting diarrhea or abdominal pain.  She has no unintentional weight loss, instead she feels that she has gained a few pounds.  # . She follows up with cardiology for CAD, long QT syndrome, chest pain, lower extremity edema.  INTERVAL HISTORY Cheyenne MLEAR CARSTENSis a 54y.o. female who has above history reviewed by me today presents for follow up visit for management of lung nodule, history of iron deficiency anemia due to gastric bypass.  .  Patient was last seen today by me.  Virtual visit in February 2021. [facility].  Patient had CT PE as well as abdomen scan done in the emergency room. 01/31/2020, CT chest PE protocol showed no PE.  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  Tiny punctated bilateral pulmonary nodules-3 mm nodule near the  left lung base, punctate nodule in the right lower lobe.  Mild bronchial wall thickening.  Patulous fluid-filled esophagus. CT abdomen pelvis with contrast showed no etiology of abdominal pain was identified.  Mild intra and extra hepatic bile duct dilatation which can be seen status post cholecystectomy.  Patient today reports feeling at baseline.  Denies any shortness of breath, cough, unintentional weight loss, chest pain. Review of Systems  Constitutional: Negative for chills, fever, malaise/fatigue and weight loss.  HENT: Negative for hearing loss and nosebleeds.   Eyes: Negative for photophobia and pain.  Respiratory: Negative for cough and sputum production.   Cardiovascular: Negative for chest pain and palpitations.  Gastrointestinal: Negative for abdominal pain, heartburn, nausea and vomiting.  Genitourinary: Negative for dysuria.  Musculoskeletal: Negative for myalgias.  Skin: Negative for rash.  Neurological: Negative for dizziness.  Endo/Heme/Allergies: Does not bruise/bleed easily.  Psychiatric/Behavioral: Negative for depression.    MEDICAL HISTORY:  Past Medical History:  Diagnosis Date  . (HFpEF) heart failure with preserved ejection fraction ([place]    a. Echo 2014: EF 65-70%, nl WM, mildly dilated LA, PASP nl; b. 12/2014 Echo: EF 65-70%, no rwma, mod septal hypertrophy w/o LVOT gradient or SAM; c. 07/2017 Echo: EF 55-60%, no rwma, mildly dil RV w/ nl syst fxn. Mildly dil RA. Dilated IVC w/ elevated CVP. Triv post effusion.  .Marland KitchenAnxiety   . Arthritis   . Asthma   . B12 deficiency   . Chronic back pain   . Chronic headaches   .  Chronic pain    a. on methadone  . Concussion    hx of 4  . Coronary artery disease, non-occlusive    a. LHC 1/18: proximal to mid LAD 40% stenosed, mid LAD 30% stenosed, mid RCA 20% stenosed, distal RCA 20% stenosed, EF 55-65%, LVEDP normal  . Depression   . DJD (degenerative joint disease), multiple sites   . Frequent headaches   . Gallstone    . H/O non-ST elevation myocardial infarction (NSTEMI)   . Hand, foot and mouth disease 2016  . History of shingles   . Hypertension   . Iron deficiency anemia   . Long QT interval   . Methadone use    managed by Dr. Primus Bravo  . Migraine    1x/mo  . Motion sickness    cars  . Obesity   . Palpitations    a. 24 hour Holter: NSR, sinus brady down to 48, occasional PVCs & couplets, 8 beats NSVT; b. 30 day event monitor 2015: NSR with rare PVC.  Marland Kitchen Psoriasis   . Syncope and collapse   . Vitamin D deficiency   . Wears dentures    full upper and lower    SURGICAL HISTORY: Past Surgical History:  Procedure Laterality Date  . ABDOMINOPLASTY     tummy tuck ? year   . [place] SURGERY  2001  . CARDIAC CATHETERIZATION Left 04/16/2016   Procedure: Left Heart Cath and Coronary Angiography;  Surgeon: [name], MD;  Location: [facility] [facility];  Service: Cardiovascular;  Laterality: Left;  . CHOLECYSTECTOMY  2001  . COLONOSCOPY WITH PROPOFOL N/A 09/28/2018   Procedure: COLONOSCOPY WITH PROPOFOL;  Surgeon: Virgel Manifold, MD;  Location: [facility];  Service: Endoscopy;  Laterality: N/A;  . ESOPHAGOGASTRODUODENOSCOPY (EGD) WITH PROPOFOL N/A 09/28/2018   Procedure: ESOPHAGOGASTRODUODENOSCOPY (EGD) WITH BIOPSY;  Surgeon: Virgel Manifold, MD;  Location: [facility];  Service: Endoscopy;  Laterality: N/A;  . GALLBLADDER SURGERY    . GASTRIC BYPASS  2001  . GASTROPLASTY      SOCIAL HISTORY: Social History   Socioeconomic History  . Marital status: Widowed    Spouse name: Not on file  . Number of children: 1  . Years of education: assoc degree  . Highest education level: Associate degree: occupational, Hotel manager, or vocational program  Occupational History  . Not on file  Tobacco Use  . Smoking status: Never Smoker  . Smokeless tobacco: Never Used  Vaping Use  . Vaping Use: Never used  Substance and Sexual Activity  . Alcohol use: No     Alcohol/week: 0.0 standard drinks    Comment: holidays  . Drug use: No  . Sexual activity: Not Currently  Other Topics Concern  . 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  Heart attack Mother   . Cancer Mother        pancreatitic 82  . Early death Mother   . Heart attack Father 70       MI  . Early death Father   . Heart disease Father   . Heart attack Brother   . Heart disease Brother   . Arthritis Brother   . Depression Brother   . Diabetes Brother   . Heart attack Maternal Grandmother   . Cancer Maternal Grandmother        pancreatitic   . Heart disease Maternal Grandmother   . Lung cancer Maternal Grandmother   . Pancreatic cancer Maternal Grandmother   . Cancer Maternal Uncle        pancreatitic   . Cancer Paternal Grandmother        ? type   . Diabetes Paternal Grandmother   . Cancer Maternal Uncle        pancreatitic   . Breast cancer Maternal Aunt   . Cancer Maternal Aunt        GYN  . Cancer Maternal Aunt        GYN    ALLERGIES:  is allergic to covid-19 (mrna) vaccine (pfizer) [covid-19 (mrna) vaccine], penicillins, and bee venom.  MEDICATIONS:  Current Outpatient Medications  Medication Sig Dispense Refill  . albuterol (VENTOLIN HFA) 108 (90 Base) MCG/ACT inhaler Inhale 1-2 puffs into the lungs as needed for wheezing. 18 g 11  . Biotin 10 MG CAPS Take by mouth.    . carvedilol (COREG)  6.25 MG tablet Take 1 tablet (6.25 mg total) by mouth 2 (two) times daily. 60 tablet 5  . Cholecalciferol (VITAMIN D3 PO) Take by mouth.    . clotrimazole (CLOTRIMAZOLE AF) 1 % cream Apply 1 application topically 2 (two) times daily. Inner thigh 60 g 12  . diclofenac sodium (VOLTAREN) 1 % GEL Apply 4 g topically 4 (four) times daily. Prn knees and 2 grams qid prn hands arthritis 100 g 11  . furosemide (LASIX) 20 MG tablet Take 1 tablet (20 mg total) by mouth daily. 35 tablet 2  . gabapentin (NEURONTIN) 400 MG capsule Limit 2 tablets in the a.m. and midday and 3 tablets each evening 630 capsule 3  . isosorbide mononitrate (IMDUR) 60 MG 24 hr tablet Take 1 tablet (60 mg total) by mouth daily. 90 tablet 2  . Magnesium Oxide 400 MG CAPS Take 1 capsule (400 mg total) by mouth daily. 30 capsule 6  . methadone (DOLOPHINE) 10 MG tablet Limit 1-2 tablets by mouth 2-3 times per day if tolerated 180 tablet 0  . potassium chloride (KLOR-CON) 10 MEQ tablet TAKE 1 TABLET BY MOUTH EVERY DAY 90 tablet 0  . rosuvastatin (CRESTOR) 10 MG tablet Take 1 tablet (10 mg total) by mouth daily. 90 tablet 3  . tiZANidine (ZANAFLEX) 4 MG tablet TAKE 1 TABLET BY MOUTH AT BEDTIME AS NEEDED FOR MUSCLE SPASMS. 90 tablet 1  . triamcinolone cream (KENALOG) 0.1 % Apply 1 application topically 2 (two) times daily. 80 g 0  . vitamin B-12 (CYANOCOBALAMIN) 500 MCG tablet Take by mouth.    . vortioxetine HBr (TRINTELLIX) 5 MG TABS tablet Take 1 tablet (5 mg total) by mouth daily. 30 tablet 1  . ARIPiprazole (ABILIFY) 2 MG tablet Take 1.5 tablets (3 mg total) by mouth daily with breakfast. (Patient not taking: No sig reported) 135 tablet 0  . carvedilol (COREG) 6.25 MG tablet Take 1 tablet (6.25 mg  total) by mouth 2 (two) times daily. (Patient not taking: Reported on 03/02/2020) 180 tablet 0  . EPINEPHrine 0.3 mg/0.3 mL IJ SOAJ injection Inject 0.3 mLs (0.3 mg total) into the muscle as needed for anaphylaxis. (Patient not taking: Reported  on 03/02/2020) 2 each 0  . nitroGLYCERIN (NITROSTAT) 0.4 MG SL tablet Place 1 tablet (0.4 mg total) under the tongue every 5 (five) minutes as needed for chest pain. (Patient not taking: Reported on 03/02/2020) 25 tablet 3   No current facility-administered medications for this visit.     PHYSICAL EXAMINATION: ECOG PERFORMANCE STATUS: 1 - Symptomatic but completely ambulatory Vitals:   03/02/20 1429  BP: 115/79  Pulse: (!) 53  Resp: 18  Temp: (!) 96.7 F (35.9 C)   Filed Weights   03/02/20 1429  Weight: 243 lb 12.8 oz (110.6 kg)    Physical Exam Constitutional:      General: She is not in acute distress.    Appearance: She is not diaphoretic.     Comments: Obese  HENT:     Head: Normocephalic and atraumatic.     Nose: Nose normal.     Mouth/Throat:     Pharynx: No oropharyngeal exudate.  Eyes:     General: No scleral icterus.       Left eye: No discharge.     Conjunctiva/sclera: Conjunctivae normal.     Pupils: Pupils are equal, round, and reactive to light.  Neck:     Vascular: No JVD.  Cardiovascular:     Rate and Rhythm: Normal rate and regular rhythm.     Heart sounds: Normal heart sounds. No murmur heard.   Pulmonary:     Effort: Pulmonary effort is normal. No respiratory distress.     Breath sounds: Normal breath sounds. No wheezing or rales.  Chest:     Chest wall: No tenderness.  Abdominal:     General: Bowel sounds are normal. There is no distension.     Palpations: Abdomen is soft. There is no mass.     Tenderness: There is no abdominal tenderness. There is no rebound.  Musculoskeletal:        General: No tenderness or deformity. Normal range of motion.     Cervical back: Normal range of motion and neck supple.  Lymphadenopathy:     Cervical: No cervical adenopathy.  Skin:    General: Skin is warm and dry.     Findings: No erythema or rash.  Neurological:     Mental Status: She is alert and oriented to person, place, and time.     Cranial  Nerves: No cranial nerve deficit.     Motor: No abnormal muscle tone.     Coordination: Coordination normal.  Psychiatric:        Mood and Affect: Affect normal.        Judgment: Judgment normal.      LABORATORY DATA:  I have reviewed the data as listed Lab Results  Component Value Date   WBC 9.9 02/02/2020   HGB 13.3 02/02/2020   HCT 39.1 02/02/2020   MCV 91 02/02/2020   PLT 234 02/02/2020   Recent Labs    11/09/19 1247 12/07/19 1106 02/02/20 1103  NA 140 141 142  K 4.0 3.6 4.2  CL 101 103 101  CO2 _0 GLUCOSE 98 91 100*  BUN <5* 9 9  CREATININE 0.82 0.84 0.96  CALCIUM 8.8* 8.9 9.3  GFRNONAA >60 >60 68  GFRAA >60 >60 79  PROT  --  6.7  --   ALBUMIN  --  3.5  --   AST  --  29  --   ALT  --  21  --   ALKPHOS  --  107  --   BILITOT  --  0.7  --        ASSESSMENT & PLAN:  1. Other iron deficiency anemia   2. Anemia due to folic acid deficiency, unspecified deficiency type    #History of iron deficiency anemia.  In the context of gastric bypass. Patient does not have blood work done today. 02/02/2020, CBC showed normal hemoglobin.  Ferritin was at 205. Hold off additional IV Venofer treatments at this point. Continue surveillance and monitoring levels.  She agrees with the plan.  #Very small lung nodules  Patient is a never smoker.  Low risk.  I discussed with her that currently the size of the lung nodules are not feasible for biopsy and are likely below the detective she has showed for PET scan.  I recommend observation.  Reassurance was provided.  She agrees with the plan. Recommend to repeat CT scan in 1 year for follow-up.   All questions were answered. The patient knows to call the clinic with any problems questions or concerns.  Return of visit: 6 months.   Orders Placed This Encounter  Procedures  . CBC with Differential/Platelet    Standing Status:   Future    Standing Expiration Date:   03/02/2021  . Ferritin    Standing Status:    Future    Standing Expiration Date:   03/02/2021  . Iron and TIBC    Standing Status:   Future    Standing Expiration Date:   03/02/2021  . Vitamin B12    Standing Status:   Future    Standing Expiration Date:   03/02/2021    Earlie Server, MD, PhD Hematology Oncology [facility] at [facility] Pager- [phone] 03/02/2020

## 2020-03-03 NOTE — Progress Notes (Signed)
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 She tells me of having syncopal events for the last 10 years, the first time happening while she was doing the dishes. Her recent episodes are usually upon exertion and accompanied with hot flashes, light-headedness and feeling clammy. She informs me that her PCP ordered the test as there is a significant family history of pancreatic cancer in her family. 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  Family history Leighanne (III.3) is the only child of her parents. She has an adopted child (not indicated on pedigree). Her father (II.3) had a heart murmur. He died while undergoing an open heart surgery at age 65. Her paternal uncle (II.1) now has a  pacemaker. She is not in touch with her paternal relatives and is not very familiar with their medical history.  There is no history of heart disease amongst her maternal relatives, but significant history of pancreatic cancer. Her mother (II.4), two maternal uncles (II.5, II.6), a maternal aunt (II.10) and maternal grandmother (I.4) died of pancreatic cancer.  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 I explained to her that mutations in MYBPC3 account for nearly 40% of HCM mutations. The pathogenic variant that was detected has been reported in several other HCM patients. The MYBPC3 variant,c.2309-2A>G occurs in the canonical splice acceptor site for exon 24 and it is predicted to disrupt  gene splicing thus leading to an aberrant transcript leading to a frame shift. It is likely that the aberrant transcripts and/or protein are degraded by the cells surveillance system. Loss of function MYBPC3 mutations arising from splice site variants or frame-shift mutations have been reported in association with HCM and are a well-established cause of HCM   Impression and Plan Since there are no living first-degree relatives, further testing of her family members is not warranted. I recommended that she speak with her paternal uncle with the pacemaker to inform him of her diagnosis. If he does have HCM, then her paternal relatives can be tested for the familial variant. She verbalized understanding of this.  Please note that the patient has not been counseled in this visit on personal, cultural or ethical issues that she may face due to her heart condition.   Sidney Ace, Ph.D, [facility] Clinical Molecular Geneticist

## 2020-03-07 NOTE — Telephone Encounter (Signed)
Cardiac clearance was refaxed to see if we could schedule procedures.

## 2020-03-12 NOTE — Telephone Encounter (Signed)
Clearance request received for EGD and colonoscopy (date TBD) with Dr. Shan Levans. Testing was ordered due to CT showing patulous fluid filled esophagus and atypical chest pain despite PPI. Per GI most recent note 02/23/20 the testing is not urgent.   Echo 01/17/20 LVEF 55-60%, moderate asymmetric LVH of septal segment, gr2DD.  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 She had partial cardiac MRI 03/01/20 however was not able to tolerate all of the imaging and the test was stopped. ZIO 02/22/20 with predominantly NSR average rate 63 bpm, 1 run VT (5 bets max rate 160bpm), 1 run SVT (10.2 seconds average 126bpm), frequent PVC (8.8%)  She has upcoming appointment with EP, Dr. Lalla Brothers, 03/21/19.   Will route to her primary cardiologist, Dr. Mariah Milling, for input.   Alver Sorrow, NP

## 2020-03-14 ENCOUNTER — Telehealth: Payer: Self-pay | Admitting: Internal Medicine

## 2020-03-14 NOTE — Telephone Encounter (Signed)
Rejection Reason - Patient did not respond - Pt did not respond to any contact attempts; Has not started seminar. Will keep referral for 1 year in case pt is interested." [facility] said on Mar 06, 2020 2:18 PM

## 2020-03-15 ENCOUNTER — Telehealth: Payer: Self-pay | Admitting: Cardiovascular Disease

## 2020-03-15 NOTE — Telephone Encounter (Signed)
Called pt back, pt reports she feels terrible, hard time breathing and her chest is "kicking her", thinks she is having "heart issues" along with the sore throat, body aches, and headaches. Pt sounds distress on phone, upset and tearful advised to seek medical attention if her breathing is getting worse and her CP is not subsiding, possible need for ER visit d/t to her symptoms, cannot see in clinic and possible not seen elsewhere due to her having COVID. Explained to pt if she seeks ER, there is a wait time, however, they can do an EKG and labs while at triage if RN feels relevant. Pt verbalized understanding. Will call back if anything changes.

## 2020-03-15 NOTE — Telephone Encounter (Signed)
Patient states she has COVID and has been having some "heart issues". States she is having a hard time breathing. Patient states she feels like her heart is "kicking me". States she also has body aches , sore throat , headache. Please call to discuss.

## 2020-03-18 NOTE — Telephone Encounter (Signed)
Okay to have EGD

## 2020-03-19 ENCOUNTER — Telehealth (INDEPENDENT_AMBULATORY_CARE_PROVIDER_SITE_OTHER): Payer: Medicare Other | Admitting: Psychiatry

## 2020-03-19 ENCOUNTER — Encounter: Payer: Self-pay | Admitting: Psychiatry

## 2020-03-19 ENCOUNTER — Other Ambulatory Visit: Payer: Self-pay

## 2020-03-19 DIAGNOSIS — F331 Major depressive disorder, recurrent, moderate: Secondary | ICD-10-CM | POA: Diagnosis not present

## 2020-03-19 DIAGNOSIS — F4310 Post-traumatic stress disorder, unspecified: Secondary | ICD-10-CM

## 2020-03-19 DIAGNOSIS — G4701 Insomnia due to medical condition: Secondary | ICD-10-CM | POA: Diagnosis not present

## 2020-03-19 DIAGNOSIS — F411 Generalized anxiety disorder: Secondary | ICD-10-CM

## 2020-03-19 DIAGNOSIS — F1590 Other stimulant use, unspecified, uncomplicated: Secondary | ICD-10-CM

## 2020-03-19 NOTE — Telephone Encounter (Signed)
   Primary Cardiologist: Julien Nordmann, MD  Chart reviewed as part of pre-operative protocol coverage. Per Dr. Mariah Milling, Cheyenne Gray would be at acceptable risk for the planned procedure without further cardiovascular testing.   I will route this recommendation to the requesting party via Epic fax function and remove from pre-op pool.  Please call with questions.  Beatriz Stallion, PA-C 03/19/2020, 8:18 AM

## 2020-03-19 NOTE — Progress Notes (Signed)
Virtual Visit via Telephone Note  I connected with Cheyenne Gray on 03/19/20 at 10:20 AM EST by telephone and verified that I am speaking with the correct person using two identifiers.  Location Provider Location : [facility] Patient Location : Home  Participants: Patient , Provider   I discussed the limitations, risks, security and privacy concerns of performing an evaluation and management service by telephone and the availability of in person appointments. I also discussed with the patient that there may be a patient responsible charge related to this service. The patient expressed understanding and agreed to proceed.   I discussed the assessment and treatment plan with the patient. The patient was provided an opportunity to ask questions and all were answered. The patient agreed with the plan and demonstrated an understanding of the instructions.   The patient was advised to call back or seek an in-person evaluation if the symptoms worsen or if the condition fails to improve as anticipated.   [place] MD OP Progress Note  03/19/2020 4:20 PM Cheyenne Gray  MRN:  [id number]  Chief Complaint:  Chief Complaint    Follow-up     HPI: Cheyenne Gray is a 55 year old Caucasian female, widowed on disability, has a history of PTSD, MDD, GAD, insomnia, caffeine use disorder , history of prolonged QT syndrome, chronic back pain on methadone, vitamin B12 deficiency, heart failure, iron deficiency, vitamin D deficiency, history of gastric bypass, MI, hypertension was evaluated by telemedicine today.  Patient today reports she is currently struggling with possible COVID-19 infection.  She reports a neighbor got infected with COVID-19 and she did babysitting for her.  She reports since 28th of December she has been feeling tired, congested, has cough, headaches and is not getting any better.  She currently struggles with shortness of breath on a regular basis.  She reports she did reach out to her  cardiologist however did not think about reaching out to her primary care provider.  She agrees to do so.  She agrees to get help.  Patient reports other than her COVID-19 infection symptoms she has been feeling okay mood wise.  She is anxious about her health however denies any significant depression, anxiety attacks.  She currently struggles with sleep due to her cough however prior to that she was sleeping okay.  Patient denies any suicidality, homicidality or perceptual disturbances.  She is compliant on all medications except for her Abilify.  She reports since she started the Trintellix she did know she was supposed to continue the Abilify.  She however has been feeling good with regards to her mood without the Abilify.  Patient denies any other concerns today.  Visit Diagnosis:    ICD-10-CM   1. PTSD (post-traumatic stress disorder)  F43.10   2. GAD (generalized anxiety disorder)  F41.1   3. MDD (major depressive disorder), recurrent episode, moderate (HCC)  F33.1   4. Insomnia due to medical condition  G47.01    cough  5. Caffeine use disorder  F15.90     Past Psychiatric History: I have reviewed past psychiatric history from my progress note on 10/17/2017.  Past trials of Paxil, Cymbalta, Valium, melatonin, trazodone, Ambien, Wellbutrin, Abilify  Past Medical History:  Past Medical History:  Diagnosis Date  . (HFpEF) heart failure with preserved ejection fraction ([place])    a. Echo 2014: EF 65-70%, nl WM, mildly dilated LA, PASP nl; b. 12/2014 Echo: EF 65-70%, no rwma, mod septal hypertrophy w/o LVOT gradient or SAM; c. 07/2017  Echo: EF 55-60%, no rwma, mildly dil RV w/ nl syst fxn. Mildly dil RA. Dilated IVC w/ elevated CVP. Triv post effusion.  Marland Kitchen Anxiety   . Arthritis   . Asthma   . B12 deficiency   . Chronic back pain   . Chronic headaches   . Chronic pain    a. on methadone  . Concussion    hx of 4  . Coronary artery disease, non-occlusive    a. LHC 1/18: proximal to mid  LAD 40% stenosed, mid LAD 30% stenosed, mid RCA 20% stenosed, distal RCA 20% stenosed, EF 55-65%, LVEDP normal  . Depression   . DJD (degenerative joint disease), multiple sites   . Frequent headaches   . Gallstone   . H/O non-ST elevation myocardial infarction (NSTEMI)   . Hand, foot and mouth disease 2016  . History of shingles   . Hypertension   . Iron deficiency anemia   . Long QT interval   . Methadone use    managed by Dr. Primus Bravo  . Migraine    1x/mo  . Motion sickness    cars  . Obesity   . Palpitations    a. 24 hour Holter: NSR, sinus brady down to 48, occasional PVCs & couplets, 8 beats NSVT; b. 30 day event monitor 2015: NSR with rare PVC.  Marland Kitchen Psoriasis   . Syncope and collapse   . Vitamin D deficiency   . Wears dentures    full upper and lower    Past Surgical History:  Procedure Laterality Date  . ABDOMINOPLASTY     tummy tuck ? year   . [place] SURGERY  2001  . CARDIAC CATHETERIZATION Left 04/16/2016   Procedure: Left Heart Cath and Coronary Angiography;  Surgeon: [name], MD;  Location: [facility];  Service: Cardiovascular;  Laterality: Left;  . CHOLECYSTECTOMY  2001  . COLONOSCOPY WITH PROPOFOL N/A 09/28/2018   Procedure: COLONOSCOPY WITH PROPOFOL;  Surgeon: Virgel Manifold, MD;  Location: [facility];  Service: Endoscopy;  Laterality: N/A;  . ESOPHAGOGASTRODUODENOSCOPY (EGD) WITH PROPOFOL N/A 09/28/2018   Procedure: ESOPHAGOGASTRODUODENOSCOPY (EGD) WITH BIOPSY;  Surgeon: Virgel Manifold, MD;  Location: [facility];  Service: Endoscopy;  Laterality: N/A;  . GALLBLADDER SURGERY    . GASTRIC BYPASS  2001  . GASTROPLASTY      Family Psychiatric History: I have reviewed family psychiatric history from my progress note on 10/17/2017  Family History:  Family History  Problem Relation Age of Onset  . Heart attack Mother   . Cancer Mother        pancreatitic 35  . Early death Mother   . Heart attack Father 89       MI   . Early death Father   . Heart disease Father   . Heart attack Brother   . Heart disease Brother   . Arthritis Brother   . Depression Brother   . Diabetes Brother   . Heart attack Maternal Grandmother   . Cancer Maternal Grandmother        pancreatitic   . Heart disease Maternal Grandmother   . Lung cancer Maternal Grandmother   . Pancreatic cancer Maternal Grandmother   . Cancer Maternal Uncle        pancreatitic   . Cancer Paternal Grandmother        ? type   . Diabetes Paternal Grandmother   . Cancer Maternal Uncle        pancreatitic   .  Breast cancer Maternal Aunt   . Cancer Maternal Aunt        GYN  . Cancer Maternal Aunt        GYN    Social History: Reviewed social history from my progress note on 10/17/2017 Social History   Socioeconomic History  . Marital status: Widowed    Spouse name: Not on file  . Number of children: 1  . Years of education: assoc degree  . Highest education level: Associate degree: occupational, Hotel manager, or vocational program  Occupational History  . Not on file  Tobacco Use  . Smoking status: Never Smoker  . Smokeless tobacco: Never Used  Vaping Use  . Vaping Use: Never used  Substance and Sexual Activity  . Alcohol use: No    Alcohol/week: 0.0 standard drinks    Comment: holidays  . Drug use: No  . Sexual activity: Not Currently  Other Topics Concern  . 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 Covid-19 ([facility]na)  Vaccine Therapist, music) [Covid-19 (Mrna) Vaccine] Itching, Palpitations and Other (See Comments)    Throat closing.   Marland Kitchen Penicillins Hives, Shortness Of Breath and Rash    Has patient had a PCN reaction causing immediate rash, facial/tongue/throat swelling, SOB or lightheadedness with hypotension: Yes Has patient had a PCN reaction causing severe rash involving mucus membranes or skin necrosis: No Has patient had a PCN reaction that required hospitalization No Has patient had a PCN reaction occurring within the last 10 years: No If all of the above answers are "NO", then may proceed with Cephalosporin use.   Raelyn Ensign Venom     Metabolic Disorder Labs: Lab Results  Component Value Date   HGBA1C 5.3 09/01/2018   No results found for: PROLACTIN Lab Results  Component Value Date   CHOL 114 12/07/2019   TRIG 63 12/07/2019   HDL 54 12/07/2019   CHOLHDL 2.1 12/07/2019   VLDL 13 12/07/2019   LDLCALC 47 12/07/2019   LDLCALC 95 09/01/2018   Lab Results  Component Value Date   TSH 3.915 12/07/2019   TSH 3.250 11/09/2019    Therapeutic Level Labs: No results found for: LITHIUM No results found for: VALPROATE No components found for:  CBMZ  Current Medications: Current Outpatient Medications  Medication Sig Dispense Refill  . albuterol (VENTOLIN HFA) 108 (90 Base) MCG/ACT inhaler Inhale 1-2 puffs into the lungs as needed for wheezing. 18 g 11  . Biotin 10 MG CAPS Take by mouth.    . carvedilol (COREG) 6.25 MG tablet Take 1 tablet (6.25 mg total) by mouth 2 (two) times daily. 60 tablet 5  . carvedilol (COREG) 6.25 MG tablet Take 1 tablet (6.25 mg total) by mouth 2 (two) times daily. (Patient not taking: Reported on 03/02/2020) 180 tablet 0  . Cholecalciferol (VITAMIN D3 PO) Take by mouth.    . clotrimazole (  CLOTRIMAZOLE AF) 1 % cream Apply 1 application topically 2 (two) times daily. Inner thigh 60 g 12  . diclofenac sodium (VOLTAREN) 1 % GEL Apply 4 g topically 4 (four) times daily. Prn knees  and 2 grams qid prn hands arthritis 100 g 11  . EPINEPHrine 0.3 mg/0.3 mL IJ SOAJ injection Inject 0.3 mLs (0.3 mg total) into the muscle as needed for anaphylaxis. (Patient not taking: Reported on 03/02/2020) 2 each 0  . furosemide (LASIX) 20 MG tablet Take 1 tablet (20 mg total) by mouth daily. 35 tablet 2  . gabapentin (NEURONTIN) 400 MG capsule Limit 2 tablets in the a.m. and midday and 3 tablets each evening 630 capsule 3  . isosorbide mononitrate (IMDUR) 60 MG 24 hr tablet Take 1 tablet (60 mg total) by mouth daily. 90 tablet 2  . Magnesium Oxide 400 MG CAPS Take 1 capsule (400 mg total) by mouth daily. 30 capsule 6  . methadone (DOLOPHINE) 10 MG tablet Limit 1-2 tablets by mouth 2-3 times per day if tolerated 180 tablet 0  . nitroGLYCERIN (NITROSTAT) 0.4 MG SL tablet Place 1 tablet (0.4 mg total) under the tongue every 5 (five) minutes as needed for chest pain. (Patient not taking: Reported on 03/02/2020) 25 tablet 3  . potassium chloride (KLOR-CON) 10 MEQ tablet TAKE 1 TABLET BY MOUTH EVERY DAY 90 tablet 0  . rosuvastatin (CRESTOR) 10 MG tablet Take 1 tablet (10 mg total) by mouth daily. 90 tablet 3  . tiZANidine (ZANAFLEX) 4 MG tablet TAKE 1 TABLET BY MOUTH AT BEDTIME AS NEEDED FOR MUSCLE SPASMS. 90 tablet 1  . triamcinolone cream (KENALOG) 0.1 % Apply 1 application topically 2 (two) times daily. 80 g 0  . vitamin B-12 (CYANOCOBALAMIN) 500 MCG tablet Take by mouth.    . vortioxetine HBr (TRINTELLIX) 5 MG TABS tablet Take 1 tablet (5 mg total) by mouth daily. 30 tablet 1   No current facility-administered medications for this visit.     Musculoskeletal: Strength & Muscle Tone: UTA Gait & Station: UTA Patient leans: N/A  Psychiatric Specialty Exam: Review of Systems  Constitutional: Positive for fatigue.  HENT: Positive for congestion.   Respiratory: Positive for cough and shortness of breath.   Musculoskeletal: Positive for myalgias.  Neurological: Positive for headaches.  All  other systems reviewed and are negative.   Last menstrual period 02/18/2016.There is no height or weight on file to calculate BMI.  General Appearance: UTA  Eye Contact:  UTA  Speech:  Clear and Coherent  Volume:  Normal  Mood:  Anxious  Affect:  UTA  Thought Process:  Goal Directed and Descriptions of Associations: Intact  Orientation:  Full (Time, Place, and Person)  Thought Content: Logical   Suicidal Thoughts:  No  Homicidal Thoughts:  No  Memory:  Immediate;   Fair Recent;   Fair Remote;   Fair  Judgement:  Fair  Insight:  Fair  Psychomotor Activity:  UTA  Concentration:  Concentration: Fair and Attention Span: Fair  Recall:  [employer] of Knowledge: Fair  Language: Fair  Akathisia:  No  Handed:  Right  AIMS (if indicated): UTA  Assets:  Communication Skills Desire for Improvement Housing  ADL's:  Intact  Cognition: WNL  Sleep:  Restless due to cough   Screenings: GAD-7   Flowsheet Row Video Visit from 02/15/2020 in [facility] Office Visit from 07/26/2019 in [facility] Office Visit from 09/21/2015 in [facility]  Total GAD-7 Score _0 Mini-Mental   Flowsheet Row Office Visit from 10/30/2017 in [facility]  Total Score (max 30 points ) 27    PHQ2-9   Flowsheet Row Video Visit from 02/15/2020 in [facility] Office Visit from 07/26/2019 in [facility] Office Visit from 12/21/2018 in [facility] Office Visit from 08/18/2017 in [facility] Office Visit from 11/15/2015 in [facility]  PHQ-2 Total Score 1 5 0 0 0  PHQ-9 Total Score 3 19 -- -- --       Assessment and Plan: MAYRENE BASTARACHE is a 55 year old Caucasian female, widowed, has a history of PTSD, depression, anxiety, multiple medical problems including coronary artery  disease, prolonged QT per history, PVC, gastric bypass, vitamin B12 deficiency, vitamin D deficiency, chronic pain on methadone was evaluated by telemedicine today.  Patient is currently struggling with COVID-19 infection symptoms however otherwise is doing okay on the current medication regimen.  Plan as noted below.  Plan PTSD-stable Continue CBT  MDD-stable We will monitor closely Discontinue Abilify for noncompliance. Trintellix 5 mg p.o. daily  GAD-improving Trintellix 5 mg p.o. daily Continue CBT  Insomnia-restless Sleep problems are currently due to her cough. Patient advised to go to the nearest emergency department, or contact her primary care provider for treatment of COVID-19 infection symptoms.  Provided education.  Caffeine use disorder-improving Will monitor closely  Follow-up in clinic in 6 to 8 weeks or sooner if needed.  I have spent atleast 20 minutes non face to face with patient today. More than 50 % of the time was spent for preparing to see the patient ( e.g., review of test, records ), ordering medications and test ,psychoeducation and supportive psychotherapy and care coordination,as well as documenting clinical information in electronic health record. This note was generated in part or whole with voice recognition software. Voice recognition is usually quite accurate but there are transcription errors that can and very often do occur. I apologize for any typographical errors that were not detected and corrected.     Ursula Alert, MD 03/19/2020, 4:20 PM

## 2020-03-20 ENCOUNTER — Institutional Professional Consult (permissible substitution): Payer: Medicare Other | Admitting: Cardiology

## 2020-03-20 NOTE — Telephone Encounter (Signed)
Patient has an appointment with Dr. Mariah Milling today and they will discuss if patient is able to have her procedure.

## 2020-03-22 NOTE — Telephone Encounter (Signed)
Patient was told to go to the ED since she was not feeling well. However, I do not that she went. I also checked if she had a follow up appointment with the cardiologist and she does but until 04/18/20. Therefore, until then, we will send another cardiac clearance request.

## 2020-03-26 ENCOUNTER — Ambulatory Visit (INDEPENDENT_AMBULATORY_CARE_PROVIDER_SITE_OTHER): Payer: Medicare Other | Admitting: Licensed Clinical Social Worker

## 2020-03-26 ENCOUNTER — Other Ambulatory Visit: Payer: Self-pay

## 2020-03-26 DIAGNOSIS — F411 Generalized anxiety disorder: Secondary | ICD-10-CM

## 2020-03-26 DIAGNOSIS — F4310 Post-traumatic stress disorder, unspecified: Secondary | ICD-10-CM

## 2020-03-26 NOTE — Progress Notes (Signed)
Virtual Visit via Video Note  I connected with Cheyenne Gray on 03/26/20 at 11:00 AM EST by a video enabled telemedicine application and verified that I am speaking with the correct person using two identifiers.  Location: Patient: home Provider: [facility]   I discussed the limitations of evaluation and management by telemedicine and the availability of in person appointments. The patient expressed understanding and agreed to proceed.  The patient was advised to call back or seek an in-person evaluation if the symptoms worsen or if the condition fails to improve as anticipated.  I provided 30  minutes of non-face-to-face time during this encounter.   Brylon Brenning R Darryll Raju, LCSW    THERAPIST PROGRESS NOTE  Session Time: 11-11:30a  Participation Level: Active  Behavioral Response: NeatAlertAnxious  Type of Therapy: Individual Therapy  Treatment Goals addressed: Anxiety and Coping  Interventions: Motivational Interviewing and Supportive  Summary: Cheyenne Gray is a 55 y.o. female who presents with improving symptoms related to PTSD/GAD diagnoses. Pt reports that overall mood is improved and that stress/anxiety levels have been fluctuating because of worries associated with recent covid diagnosis--but overall they have improved recently due to resting/relaxing more. Pt reports that she is compliant with medications and is getting good quality and quantity of sleep.   Allowed pt to explore thoughts and feelings about recent illness, current health-related concerns, social interactions with others, and recent external stressors.   Pt is continuing to have some stress about having to move--feels that rent prices have increased dramatically since she rented current home.   Encouraged pt to continue focusing on self care, resting/relaxing, engaging in positive social supports once quarantine has ended, engaging in pleasurable activities, maintaining life balance. Pt feels well balanced  at time of session.   Suicidal/Homicidal: No  Therapist Response: Cheyenne Gray is continuing to develop strategies to reduce and eliminate anxiety and implement appropriate relaxation and diversion activities to decrease anxiety. Due to recently acquiring covid, pt has been prioritizing rest and relaxation, which has helped with overall anxiety. Pt is maintaining current levels of progress. Treatment to continue as indicated.  Plan: Return again in 4 weeks.  Diagnosis: Axis I: Generalized Anxiety Disorder and Post Traumatic Stress Disorder    Axis II: No diagnosis    Ernest Haber Tramel Westbrook, LCSW 03/26/2020

## 2020-03-28 ENCOUNTER — Telehealth: Payer: Self-pay | Admitting: *Deleted

## 2020-03-28 NOTE — Telephone Encounter (Signed)
-----   Message from Alver Sorrow, NP sent at 03/28/2020  3:07 PM EST ----- Hi,   I spoke with [facility] about this. The patient did have a partial cardiac MRI on 03/01/20. However, she was unable to tolerate all of the images so they were unable to complete the study. She was not billed for the study.   Alver Sorrow, NP  ----- Message ----- From: Jefferey Pica, RN Sent: 03/28/2020   2:28 PM EST To: Jefferey Pica, RN, Antonieta Iba, MD, #  I was reviewing some of my follow up and had this patient down that she was to have a Cardiac MRI on 03/01/20.  I can't tell if she had it and it hasn't been read.  Can one of you look behind me please!  She is due to see Dr. Lalla Brothers on 2/2.

## 2020-03-28 NOTE — Telephone Encounter (Signed)
Noted  

## 2020-04-04 ENCOUNTER — Telehealth: Payer: Self-pay | Admitting: Internal Medicine

## 2020-04-04 ENCOUNTER — Telehealth: Payer: Self-pay

## 2020-04-04 DIAGNOSIS — I472 Ventricular tachycardia: Secondary | ICD-10-CM

## 2020-04-04 DIAGNOSIS — I4729 Other ventricular tachycardia: Secondary | ICD-10-CM

## 2020-04-04 NOTE — Telephone Encounter (Signed)
Left message for patient to call back and schedule Medicare Annual Wellness Visit (AWV)   This should be a virtual visit only=30 minutes.  No hx of AWV; please schedule at anytime with Denisa O'Brien-Blaney at [facility] [place]   

## 2020-04-04 NOTE — Telephone Encounter (Signed)
Received the following MyChart message from patient:  [name], I was wondering about my second monitor you ordered for me. I mailed it a long while ago but yet haven't seen anything about it like the last one. So I was just making sure it was received.   Thank You [name]   P.s. 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  I just get told all these plus the heart murmur is going on and that's it. Thank you  This monitor had not been uploaded, result was posted on [employer] on 03/11/20. Monitor report now available and sent to Dr. Kara Dies to read. Will Route this communication to Dr. Mariah Milling and Marisue Ivan.

## 2020-04-08 NOTE — Telephone Encounter (Signed)
Not there to read Thx TG

## 2020-04-09 NOTE — Telephone Encounter (Signed)
I see it there.

## 2020-04-12 ENCOUNTER — Other Ambulatory Visit: Payer: Self-pay | Admitting: Psychiatry

## 2020-04-12 DIAGNOSIS — F411 Generalized anxiety disorder: Secondary | ICD-10-CM

## 2020-04-12 DIAGNOSIS — F4310 Post-traumatic stress disorder, unspecified: Secondary | ICD-10-CM

## 2020-04-12 NOTE — Telephone Encounter (Signed)
Second monitor showed she triggered for PVCs Other arrhythmia noted were rare, was not patient triggered

## 2020-04-13 NOTE — Telephone Encounter (Signed)
Called patient and informed her of Dr. Windell Hummingbird note as stated below. Patient also confirmed that she will be here next week for her follow up with Dr. Lalla Brothers.

## 2020-04-18 ENCOUNTER — Other Ambulatory Visit: Payer: Self-pay

## 2020-04-18 ENCOUNTER — Ambulatory Visit (INDEPENDENT_AMBULATORY_CARE_PROVIDER_SITE_OTHER): Payer: Medicare Other | Admitting: Cardiology

## 2020-04-18 ENCOUNTER — Encounter: Payer: Self-pay | Admitting: Cardiology

## 2020-04-18 VITALS — BP 104/78 | HR 56 | Ht 66.0 in | Wt 235.0 lb

## 2020-04-18 DIAGNOSIS — I472 Ventricular tachycardia: Secondary | ICD-10-CM

## 2020-04-18 DIAGNOSIS — I422 Other hypertrophic cardiomyopathy: Secondary | ICD-10-CM

## 2020-04-18 DIAGNOSIS — R55 Syncope and collapse: Secondary | ICD-10-CM | POA: Diagnosis not present

## 2020-04-18 DIAGNOSIS — I4729 Other ventricular tachycardia: Secondary | ICD-10-CM

## 2020-04-18 MED ORDER — ALPRAZOLAM 0.25 MG PO TABS: 0.2500 mg | ORAL_TABLET | Freq: Once | ORAL | 0 refills | Status: AC

## 2020-04-18 NOTE — Progress Notes (Signed)
Electrophysiology Office Note:    Date:  04/18/2020   ID:  Cheyenne Gray, DOB 03/17/1966, MRN [id number]  PCP:  McLean-Scocuzza, Nino Glow, MD  [facility] Cardiologist:  Ida Rogue, MD  [facility] Electrophysiologist:  Vickie Epley, MD   Referring MD: Arvil Chaco, PA*   Chief Complaint: Hypertrophic cardiomyopathy  History of Present Illness:    Cheyenne Gray is a 55 y.o. female who presents for an evaluation of hypertrophic cardiomyopathy sudden cardiac death risk stratification at the request of Cheyenne Quarry, PA-C. Their medical history includes chronic diastolic heart failure, chronic pain on methadone, coronary artery disease, hypertension, migraine and syncope.  Patient has a long history of PVCs and palpitations.  She has been worked up extensively and is worn multiple heart monitors which show various degrees of PVC burdens.  She has never had a sustained ventricular arrhythmia captured on monitor.  She reports frequent passing out spells.  Her passing out spells are consistently preceded by a prodrome of feeling flushed.  She also describes visual changes that preceded her loss of consciousness.  They typically occur when she is standing.  Her prodrome lasts seconds at a time.  She does not describe any abrupt arrhythmic syncope.  She tells me she has an uncle who she has recently confirmed has a diagnosis of hypertrophic cardiomyopathy.  [name] has previously seen Dr. Broadus John with genetics.  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  She also describes sudden death at a early age and her father while undergoing a heart procedure.  She previously had a cardiac MRI that was aborted early for unclear reasons.  From my discussion with the patient it sounds like she may have been moving during the exam.  She was unable to take her Xanax prior to the procedure because she did not have a ride to take her home.  She is very eager to  repeat the study and knows that she will be able to lay flat for the duration of the procedure.  Past Medical History:  Diagnosis Date  . (HFpEF) heart failure with preserved ejection fraction ([place])    a. Echo 2014: EF 65-70%, nl WM, mildly dilated LA, PASP nl; b. 12/2014 Echo: EF 65-70%, no rwma, mod septal hypertrophy w/o LVOT gradient or SAM; c. 07/2017 Echo: EF 55-60%, no rwma, mildly dil RV w/ nl syst fxn. Mildly dil RA. Dilated IVC w/ elevated CVP. Triv post effusion.  Marland Kitchen Anxiety   . Arthritis   . Asthma   . B12 deficiency   . Chronic back pain   . Chronic headaches   . Chronic pain    a. on methadone  . Concussion    hx of 4  . Coronary artery disease, non-occlusive    a. LHC 1/18: proximal to mid LAD 40% stenosed, mid LAD 30% stenosed, mid RCA 20% stenosed, distal RCA 20% stenosed, EF 55-65%, LVEDP normal  . Depression   . DJD (degenerative joint disease), multiple sites   . Frequent headaches   . Gallstone   . H/O non-ST elevation myocardial infarction (NSTEMI)   . Hand, foot and mouth disease 2016  . History of shingles   . Hypertension   . Iron deficiency anemia   . Long QT interval   . Methadone use    managed by Dr. Primus Bravo  . Migraine    1x/mo  . Motion sickness    cars  . Obesity   . Palpitations  a. 24 hour Holter: NSR, sinus brady down to 48, occasional PVCs & couplets, 8 beats NSVT; b. 30 day event monitor 2015: NSR with rare PVC.  Marland Kitchen Psoriasis   . Syncope and collapse   . Vitamin D deficiency   . Wears dentures    full upper and lower    Past Surgical History:  Procedure Laterality Date  . ABDOMINOPLASTY     tummy tuck ? year   . [place] SURGERY  2001  . CARDIAC CATHETERIZATION Left 04/16/2016   Procedure: Left Heart Cath and Coronary Angiography;  Surgeon: [name], MD;  Location: [facility];  Service: Cardiovascular;  Laterality: Left;  . CHOLECYSTECTOMY  2001  . COLONOSCOPY WITH PROPOFOL N/A 09/28/2018   Procedure: COLONOSCOPY  WITH PROPOFOL;  Surgeon: Virgel Manifold, MD;  Location: [facility];  Service: Endoscopy;  Laterality: N/A;  . ESOPHAGOGASTRODUODENOSCOPY (EGD) WITH PROPOFOL N/A 09/28/2018   Procedure: ESOPHAGOGASTRODUODENOSCOPY (EGD) WITH BIOPSY;  Surgeon: Virgel Manifold, MD;  Location: [facility];  Service: Endoscopy;  Laterality: N/A;  . GALLBLADDER SURGERY    . GASTRIC BYPASS  2001  . GASTROPLASTY      Current Medications: Current Meds  Medication Sig  . albuterol (VENTOLIN HFA) 108 (90 Base) MCG/ACT inhaler Inhale 1-2 puffs into the lungs as needed for wheezing.  Marland Kitchen ALPRAZolam (XANAX) 0.25 MG tablet Take 1 tablet (0.25 mg total) by mouth once for 1 dose. Prior to your cardiac MRI testing  . Biotin 10 MG CAPS Take by mouth.  . carvedilol (COREG) 6.25 MG tablet Take 1 tablet (6.25 mg total) by mouth 2 (two) times daily.  . Cholecalciferol (VITAMIN D3 PO) Take by mouth.  . clotrimazole (CLOTRIMAZOLE AF) 1 % cream Apply 1 application topically 2 (two) times daily. Inner thigh  . diclofenac sodium (VOLTAREN) 1 % GEL Apply 4 g topically 4 (four) times daily. Prn knees and 2 grams qid prn hands arthritis  . EPINEPHrine 0.3 mg/0.3 mL IJ SOAJ injection Inject 0.3 mLs (0.3 mg total) into the muscle as needed for anaphylaxis.  . furosemide (LASIX) 20 MG tablet Take 20 mg by mouth as needed.  . gabapentin (NEURONTIN) 400 MG capsule Limit 2 tablets in the a.m. and midday and 3 tablets each evening  . Magnesium Oxide 400 MG CAPS Take 1 capsule (400 mg total) by mouth daily.  . methadone (DOLOPHINE) 10 MG tablet Limit 1-2 tablets by mouth 2-3 times per day if tolerated  . nitroGLYCERIN (NITROSTAT) 0.4 MG SL tablet Place 1 tablet (0.4 mg total) under the tongue every 5 (five) minutes as needed for chest pain.  . potassium chloride (KLOR-CON) 10 MEQ tablet Take 10 mEq by mouth as needed. Only take when using Lasix  . rosuvastatin (CRESTOR) 10 MG tablet Take 1 tablet (10 mg total) by mouth  daily.  Marland Kitchen tiZANidine (ZANAFLEX) 4 MG tablet TAKE 1 TABLET BY MOUTH AT BEDTIME AS NEEDED FOR MUSCLE SPASMS.  Marland Kitchen triamcinolone cream (KENALOG) 0.1 % Apply 1 application topically 2 (two) times daily.  . TRINTELLIX 5 MG TABS tablet TAKE 1 TABLET BY MOUTH EVERY DAY  . vitamin B-12 (CYANOCOBALAMIN) 500 MCG tablet Take by mouth.  . [DISCONTINUED] furosemide (LASIX) 20 MG tablet Take 1 tablet (20 mg total) by mouth daily.     Allergies:   Covid-19 (mrna) vaccine (pfizer) [covid-19 (mrna) vaccine], Penicillins, and Bee venom   Social History   Socioeconomic History  . Marital status: Widowed    Spouse name:  Not on file  . Number of children: 1  . Years of education: assoc degree  . Highest education level: Associate degree: occupational, Hotel manager, or vocational program  Occupational History  . Not on file  Tobacco Use  . Smoking status: Never Smoker  . Smokeless tobacco: Never Used  Vaping Use  . Vaping Use: Never used  Substance and Sexual Activity  . Alcohol use: No    Alcohol/week: 0.0 standard drinks    Comment: holidays  . Drug use: No  . Sexual activity: Not Currently  Other Topics Concern  . 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    Never smoker    No guns   Wears seat belt    No caffeine   Social Determinants of Radio broadcast assistant Strain: Not on file  Food Insecurity: Not on file  Transportation Needs: Not on file  Physical Activity: Not on file  Stress: Not on file  Social Connections: Not on file     Family History: The patient's family history includes Arthritis in her brother; Breast cancer in her maternal aunt; Cancer in her maternal aunt, maternal aunt, maternal grandmother, maternal uncle, maternal uncle, mother, and paternal grandmother; Depression in her brother; Diabetes in  her brother and paternal grandmother; Early death in her father and mother; Heart attack in her brother, maternal grandmother, and mother; Heart attack (age of onset: 66) in her father; Heart disease in her brother, father, and maternal grandmother; Lung cancer in her maternal grandmother; Pancreatic cancer in her maternal grandmother.  ROS:   Please see the history of present illness.    All other systems reviewed and are negative.  EKGs/Labs/Other Studies Reviewed:    The following studies were reviewed today:  February 16, 2020 ZIO Heart rate 43-171, average 62 6.3% ventricular ectopics 3 episodes of NSVT, longest lasting 6 beats at an average rate of 125 bpm 8 episodes of SVT, longest lasting 8 beats at an average rate of 102 bpm  January 17, 2020 echo personally reviewed Left ventricular function normal, 55% Moderate left ventricular hypertrophy, asymmetric Right ventricular function normal Normal mitral valve Mild to moderate aortic thickening without stenosis  April 16, 2016 left heart cath  Prox LAD to Mid LAD lesion, 40 %stenosed.  Mid LAD lesion, 30 %stenosed.  Mid RCA lesion, 20 %stenosed.  Dist RCA lesion, 20 %stenosed.  The left ventricular systolic function is normal.  LV end diastolic pressure is normal.  The left ventricular ejection fraction is 55-65% by visual estimate.   12/16/2019 ZIO monitor 3 Ventricular Tachycardia runs occurred, the run with the fastest interval lasting 5 beats with a max rate of 156 bpm,  the longest lasting 10 beats with an avg rate of 119 bpm.  4 Supraventricular Tachycardia runs occurred, the run with the fastest interval lasting 5 beats with a max rate of 214 bpm, the longest lasting 18 beats with an avg rate of 100 bpm.   Isolated SVEs were rare (<1.0%), SVE Couplets were rare (<1.0%), and SVE Triplets were rare (<1.0%). Isolated VEs were frequent (13.7%, 440347), VE Couplets were rare (<1.0%, 1001), and VE Triplets were  rare (<1.0%, 84).  Patient triggered events associated with PVCs         EKG:  The ekg  ordered today demonstrates sinus rhythm with a borderline prolonged QTC at 470 ms  Recent Labs: 12/07/2019: ALT 21; TSH 3.915 02/02/2020: BUN 9; Creatinine, Ser 0.96; Hemoglobin 13.3; Magnesium 1.6; Platelets 234; Potassium 4.2; Sodium 142  Recent Lipid Panel    Component Value Date/Time   CHOL 114 12/07/2019 1106   CHOL 114 10/02/2016 1037   CHOL 147 05/04/2013 1556   TRIG 63 12/07/2019 1106   TRIG 79 05/04/2013 1556   HDL 54 12/07/2019 1106   HDL 55 10/02/2016 1037   HDL 58 05/04/2013 1556   CHOLHDL 2.1 12/07/2019 1106   VLDL 13 12/07/2019 1106   VLDL 16 05/04/2013 1556   LDLCALC 47 12/07/2019 1106   LDLCALC 47 10/02/2016 1037   LDLCALC 73 05/04/2013 1556    Physical Exam:    VS:  BP 104/78   Pulse (!) 56   Ht _0  (1.676 m)   Wt 235 lb (106.6 kg)   LMP 02/18/2016 (Approximate) Comment: neg HCG-03/26/2016  SpO2 97%   BMI 37.93 kg/m     Wt Readings from Last 3 Encounters:  04/18/20 235 lb (106.6 kg)  03/02/20 243 lb 12.8 oz (110.6 kg)  02/23/20 251 lb (113.9 kg)     GEN:  Well nourished, well developed in no acute distress.  obese. HEENT: Normal NECK: No JVD; No carotid bruits LYMPHATICS: No lymphadenopathy CARDIAC: RRR, no murmurs, rubs, gallops RESPIRATORY:  Clear to auscultation without rales, wheezing or rhonchi  ABDOMEN: Soft, non-tender, non-distended MUSCULOSKELETAL:  No edema; No deformity  SKIN: Warm and dry NEUROLOGIC:  Alert and oriented x 3 PSYCHIATRIC:  Normal affect   ASSESSMENT:    1. Hypertrophic cardiomyopathy ([place])   2. NSVT (nonsustained ventricular tachycardia) ([place])   3. Syncope and collapse    PLAN:    In order of problems listed above:  1. 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  She has a family history of hypertrophic cardiomyopathy with an uncle who may also have  a defibrillator in situ.  Her father also died at a young age.  She does not have obstructive physiology on her echocardiogram.  She has not yet received a full cardiac MRI to help assess for burden of fibrosis.  She does have nonsustained ventricular arrhythmias on multiple ZIO monitors.  Her left ventricular function is normal.  At this point, we do need to repeat the cardiac MRI to help risk stratify her for sudden cardiac death.  If there is extensive fibrosis, I would favor implanting a cardiac defibrillator for primary prevention.  If her cardiac MRI shows no fibrosis, would favor implanting a loop recorder for ongoing rhythm monitoring and to help correlate episodes (frequent) of syncope with any abnormal rhythms.  We will order a cardiac MRI and asked that she arrange for a driver to take her the day of exam so she can take the preprocedural Xanax.  2. Her NSVT is likely due to her hypertrophic cardiomyopathy.  Cardiac MRI as above.  3. Syncope The patient has frequent syncopal episodes including 130 minutes ago.  All of the episodes she describes of syncope during today's visit are preceded by symptoms of a hot flash over her head or body and changes to her vision.  The syncope occurs when she is standing.  This prodrome lasts seconds at a time and reliably precedes the passing out.  From the information obtained during my visit with the patient today, I suspect that  her syncopal episodes are actually due to vasovagal syncope given a history of a pathogenic mutation consistent with hypertrophic cardiomyopathy and a family history of hypertrophic cardiomyopathy we need to thoroughly investigate the possibility of ventricular arrhythmias causing her syncope.  See #1 above for plan.   Medication Adjustments/Labs and Tests Ordered: Current medicines are reviewed at length with the patient today.  Concerns regarding medicines are outlined above.  Orders Placed This Encounter  Procedures  . MR Card  Morphology Wo/W Cm  . EKG 12-Lead   Meds ordered this encounter  Medications  . ALPRAZolam (XANAX) 0.25 MG tablet    Sig: Take 1 tablet (0.25 mg total) by mouth once for 1 dose. Prior to your cardiac MRI testing    Dispense:  1 tablet    Refill:  0     Signed, Lars Mage, MD, [facility]  04/18/2020 3:09 PM    Electrophysiology [place]

## 2020-04-18 NOTE — Patient Instructions (Addendum)
Medication Instructions:  Your physician recommends that you continue on your current medications as directed. Please refer to the Current Medication list given to you today.  *If you need a refill on your cardiac medications before your next appointment, please call your pharmacy*   Lab Work: None ordered   Testing/Procedures: Your physician has requested that you have a cardiac MRI. Cardiac MRI uses a computer to create images of your heart as its beating, producing both still and moving pictures of your heart and major blood vessels. For further information please visit [email or web address].        The office will call you to scheduled this after pre-certing with insurance first.   Follow-Up: At [facility], you and your health needs are our priority.  As part of our continuing mission to provide you with exceptional heart care, we have created designated Provider Care Teams.  These Care Teams include your primary Cardiologist (physician) and Advanced Practice Providers (APPs -  Physician Assistants and Nurse Practitioners) who all work together to provide you with the care you need, when you need it.  Your next appointment:   3 month(s)  The format for your next appointment:   In Person  Provider:   Steffanie Dunn, MD    Thank you for choosing [facility]!!    Other Instructions

## 2020-04-20 ENCOUNTER — Telehealth: Payer: Self-pay | Admitting: *Deleted

## 2020-04-20 NOTE — Telephone Encounter (Signed)
-----   Message from Lennon Alstrom, PA-C sent at 04/17/2020  9:28 PM EST ----- Monitor showed  --HR minimum 43 bpm, max 171 bpm, and average 62 bpm. --3 runs of VT, which is a faster heart rhythm that originates from the bottom part of the heart. The fastest run was 4 beats, and the longest run was 6 beats.   No patient triggered events during that time. --8 runs of SVT, which is a faster heart rhythm that originates from the top part of the heart.   The fastest run was 5 beats, and the longest 8 beats. --Isolated PACs (extra beats from the top part of the heart) were rare. These rarely occurred 2x or 3x in a row. --Isolated PVCs (extra beats from the bottom part of the heart) were frequent. These rarely occurred 2x or 3x in a row.   Sometimes they occurred every other beat or every third beat.  Recommend EP referral, which already has been scheduled for the patient.

## 2020-04-20 NOTE — Telephone Encounter (Signed)
Pt returned call. Notified of results. Pt verbalized understanding and has no further questions at this time.

## 2020-04-20 NOTE — Telephone Encounter (Signed)
Attempted to call pt with heart monitor results. No answer. Lmtcb. Results released to MyChart. Pt does have EP consult scheduled 07/17/20.

## 2020-04-23 ENCOUNTER — Ambulatory Visit (INDEPENDENT_AMBULATORY_CARE_PROVIDER_SITE_OTHER): Payer: Self-pay | Admitting: Licensed Clinical Social Worker

## 2020-04-23 ENCOUNTER — Other Ambulatory Visit: Payer: Self-pay

## 2020-04-23 DIAGNOSIS — Z5329 Procedure and treatment not carried out because of patient's decision for other reasons: Secondary | ICD-10-CM

## 2020-04-23 NOTE — Progress Notes (Signed)
LCSW counselor tried to connect with patient for scheduled appointment via MyChart video text request x 2 and email request; also tried to connect via phone without success. LCSW counselor left message for patient to call office number to reschedule OPT appointment.  

## 2020-04-24 ENCOUNTER — Other Ambulatory Visit: Payer: Self-pay | Admitting: Family

## 2020-04-24 NOTE — Telephone Encounter (Signed)
Rx request sent to pharmacy.  

## 2020-05-02 ENCOUNTER — Ambulatory Visit: Payer: Medicare Other | Admitting: Internal Medicine

## 2020-05-04 ENCOUNTER — Telehealth: Payer: Self-pay | Admitting: Cardiology

## 2020-05-04 NOTE — Telephone Encounter (Signed)
Left message for patient to call and discuss scheduling the Cardiac MRI ordered by Dr. Lambert 

## 2020-05-07 NOTE — Telephone Encounter (Signed)
Spoke with patient regarding 06/13/19 Cardiac MRI scheduled appointment--arrival time is 11:30 am---1st floor admissions office at [facility] for a 12:00 pm start time----pt would like an earlier date and I informed her I could speak with the scheduler and see what we can do.  She voiced her understanding.

## 2020-05-09 ENCOUNTER — Encounter: Payer: Self-pay | Admitting: Psychiatry

## 2020-05-09 ENCOUNTER — Other Ambulatory Visit: Payer: Self-pay

## 2020-05-09 ENCOUNTER — Telehealth (INDEPENDENT_AMBULATORY_CARE_PROVIDER_SITE_OTHER): Payer: Medicare Other | Admitting: Psychiatry

## 2020-05-09 ENCOUNTER — Encounter: Payer: Self-pay | Admitting: Cardiology

## 2020-05-09 DIAGNOSIS — G4701 Insomnia due to medical condition: Secondary | ICD-10-CM | POA: Diagnosis not present

## 2020-05-09 DIAGNOSIS — F411 Generalized anxiety disorder: Secondary | ICD-10-CM | POA: Diagnosis not present

## 2020-05-09 DIAGNOSIS — F4310 Post-traumatic stress disorder, unspecified: Secondary | ICD-10-CM

## 2020-05-09 DIAGNOSIS — F331 Major depressive disorder, recurrent, moderate: Secondary | ICD-10-CM | POA: Diagnosis not present

## 2020-05-09 DIAGNOSIS — F1590 Other stimulant use, unspecified, uncomplicated: Secondary | ICD-10-CM

## 2020-05-09 MED ORDER — VORTIOXETINE HBR 10 MG PO TABS: 10.0000 mg | ORAL_TABLET | Freq: Every day | ORAL | 1 refills | Status: DC

## 2020-05-09 NOTE — Progress Notes (Unsigned)
Virtual Visit via Telephone Note  I connected with Cheyenne Gray on 05/09/20 at 10:20 AM EST by telephone and verified that I am speaking with the correct person using two identifiers.  Location Provider Location : [facility] Patient Location : Home  Participants: Patient , Provider    I discussed the limitations, risks, security and privacy concerns of performing an evaluation and management service by telephone and the availability of in person appointments. I also discussed with the patient that there may be a patient responsible charge related to this service. The patient expressed understanding and agreed to proceed.    I discussed the assessment and treatment plan with the patient. The patient was provided an opportunity to ask questions and all were answered. The patient agreed with the plan and demonstrated an understanding of the instructions.   The patient was advised to call back or seek an in-person evaluation if the symptoms worsen or if the condition fails to improve as anticipated.    [facility] MD OP Progress Note  05/09/2020 10:29 AM Cheyenne Gray  MRN:  [id number]  Chief Complaint:  Chief Complaint    Follow-up     HPI: Cheyenne Gray is a 55 year old Caucasian female widowed, on disability, has a history of PTSD, MDD, GAD, insomnia, caffeine use disorder, history of prolonged QT syndrome, chronic back pain on methadone, vitamin B12 deficiency, heart failure, iron deficiency, vitamin D deficiency, history of gastric bypass, MI, hypertension was evaluated by telemedicine today.  Patient had difficulty connecting by video and hence evaluation was done by audio only.  Patient today reports she is currently struggling with anxiety.  She reports she may have scratched her face and her sleep and has excoriations which are superficial.  She reports they are healing.  She reports she has a history of picking on herself when she is very nervous however this time this happened in  sleep and does not remember doing it.  She reports she does have several psychosocial stressors.  She does have health problems, she is in pain all the time and has to move out of her house soon.  She is currently looking for another place which is anxiety provoking for her.  She denies any significant sadness however reports she has been struggling with her appetite.  She reports she has gotten used to the habit of just snacking and not having regular meals.  This has been going on for a very long time.  She reports she does not remember the last time she had a good meal.  She is planning to go to the grocery store to get some snacks today if possible.  Patient reports she has been compliant on her medications as prescribed.  She denies any side effects.  Patient reports she sleeps around 3 hours at night.  She was prescribed medications in the past however she does not like taking sleep medications since she does not want to sleep too deep and not knowing what is going on around her.  She lives in a bad neighborhood and that is anxiety provoking for her.  She also has pain which does have an impact on her sleep.  Patient denies any suicidality, homicidality or perceptual disturbances.  Patient reports she did have psychotherapy sessions recently however she does not like the idea of talking about her problems to someone over the phone without even knowing who she is talking to.  Patient is interested in inperson psychotherapy visits since she is unable to  do video visits.  Patient denies any other concerns today.  Visit Diagnosis:    ICD-10-CM   1. PTSD (post-traumatic stress disorder)  F43.10   2. GAD (generalized anxiety disorder)  F41.1   3. MDD (major depressive disorder), recurrent episode, moderate (HCC)  F33.1 Cheyenne HBr (TRINTELLIX) 10 MG TABS tablet  4. Insomnia due to medical condition  G47.01   5. Caffeine use disorder  F15.90     Past Psychiatric History: I have reviewed  past psychiatric history from my progress note on 10/17/2017.  Past trials of Paxil, Cymbalta, Valium, melatonin, trazodone, Ambien, Wellbutrin, Abilify  Past Medical History:  Past Medical History:  Diagnosis Date  . (HFpEF) heart failure with preserved ejection fraction ([place])    a. Echo 2014: EF 65-70%, nl WM, mildly dilated LA, PASP nl; b. 12/2014 Echo: EF 65-70%, no rwma, mod septal hypertrophy w/o LVOT gradient or SAM; c. 07/2017 Echo: EF 55-60%, no rwma, mildly dil RV w/ nl syst fxn. Mildly dil RA. Dilated IVC w/ elevated CVP. Triv post effusion.  Marland Kitchen Anxiety   . Arthritis   . Asthma   . B12 deficiency   . Chronic back pain   . Chronic headaches   . Chronic pain    a. on methadone  . Concussion    hx of 4  . Coronary artery disease, non-occlusive    a. LHC 1/18: proximal to mid LAD 40% stenosed, mid LAD 30% stenosed, mid RCA 20% stenosed, distal RCA 20% stenosed, EF 55-65%, LVEDP normal  . Depression   . DJD (degenerative joint disease), multiple sites   . Frequent headaches   . Gallstone   . H/O non-ST elevation myocardial infarction (NSTEMI)   . Hand, foot and mouth disease 2016  . History of shingles   . Hypertension   . Iron deficiency anemia   . Long QT interval   . Methadone use    managed by Dr. Primus Bravo  . Migraine    1x/mo  . Motion sickness    cars  . Obesity   . Palpitations    a. 24 hour Holter: NSR, sinus brady down to 48, occasional PVCs & couplets, 8 beats NSVT; b. 30 day event monitor 2015: NSR with rare PVC.  Marland Kitchen Psoriasis   . Syncope and collapse   . Vitamin D deficiency   . Wears dentures    full upper and lower    Past Surgical History:  Procedure Laterality Date  . ABDOMINOPLASTY     tummy tuck ? year   . [place] SURGERY  2001  . CARDIAC CATHETERIZATION Left 04/16/2016   Procedure: Left Heart Cath and Coronary Angiography;  Surgeon: [name], MD;  Location: [facility];  Service: Cardiovascular;  Laterality: Left;  .  CHOLECYSTECTOMY  2001  . COLONOSCOPY WITH PROPOFOL N/A 09/28/2018   Procedure: COLONOSCOPY WITH PROPOFOL;  Surgeon: Virgel Manifold, MD;  Location: [facility];  Service: Endoscopy;  Laterality: N/A;  . ESOPHAGOGASTRODUODENOSCOPY (EGD) WITH PROPOFOL N/A 09/28/2018   Procedure: ESOPHAGOGASTRODUODENOSCOPY (EGD) WITH BIOPSY;  Surgeon: Virgel Manifold, MD;  Location: [facility];  Service: Endoscopy;  Laterality: N/A;  . GALLBLADDER SURGERY    . GASTRIC BYPASS  2001  . GASTROPLASTY      Family Psychiatric History: I have reviewed family psychiatric history from my progress note on 10/17/2017  Family History:  Family History  Problem Relation Age of Onset  . Heart attack Mother   . Cancer Mother  pancreatitic 55  . Early death Mother   . Heart attack Father 58       MI  . Early death Father   . Heart disease Father   . Heart attack Brother   . Heart disease Brother   . Arthritis Brother   . Depression Brother   . Diabetes Brother   . Heart attack Maternal Grandmother   . Cancer Maternal Grandmother        pancreatitic   . Heart disease Maternal Grandmother   . Lung cancer Maternal Grandmother   . Pancreatic cancer Maternal Grandmother   . Cancer Maternal Uncle        pancreatitic   . Cancer Paternal Grandmother        ? type   . Diabetes Paternal Grandmother   . Cancer Maternal Uncle        pancreatitic   . Breast cancer Maternal Aunt   . Cancer Maternal Aunt        GYN  . Cancer Maternal Aunt        GYN    Social History: I have reviewed social history from my progress note on 10/17/2017 Social History   Socioeconomic History  . Marital status: Widowed    Spouse name: Not on file  . Number of children: 1  . Years of education: assoc degree  . Highest education level: Associate degree: occupational, Hotel manager, or vocational program  Occupational History  . Not on file  Tobacco Use  . Smoking status: Never Smoker  . Smokeless tobacco:  Never Used  Vaping Use  . Vaping Use: Never used  Substance and Sexual Activity  . Alcohol use: No    Alcohol/week: 0.0 standard drinks    Comment: holidays  . Drug use: No  . Sexual activity: Not Currently  Other Topics Concern  . 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 Covid-19 (Mrna) Vaccine Therapist, music) [Covid-19 (Mrna) Vaccine] Itching, Palpitations and Other (See Comments)    Throat closing.   Marland Kitchen Penicillins Hives, Shortness Of Breath and Rash    Has patient had a PCN reaction causing immediate rash, facial/tongue/throat swelling, SOB or lightheadedness with hypotension: Yes Has patient had a PCN reaction causing severe rash involving mucus membranes or skin necrosis: No Has patient had a PCN reaction that required hospitalization No Has patient had a PCN reaction occurring within the last 10 years: No If all of the above answers are "NO", then may proceed with Cephalosporin use.   Raelyn Ensign Venom     Metabolic Disorder Labs: Lab Results  Component Value Date   HGBA1C 5.3 09/01/2018   No results found for: PROLACTIN Lab Results  Component Value Date   CHOL 114 12/07/2019   TRIG 63 12/07/2019   HDL 54 12/07/2019   CHOLHDL 2.1 12/07/2019   VLDL 13 12/07/2019   LDLCALC 47  12/07/2019   [place] 95 09/01/2018   Lab Results  Component Value Date   TSH 3.915 12/07/2019   TSH 3.250 11/09/2019    Therapeutic Level Labs: No results found for: LITHIUM No results found  for: VALPROATE No components found for:  CBMZ  Current Medications: Current Outpatient Medications  Medication Sig Dispense Refill  . baclofen (LIORESAL) 10 MG tablet Take 1 tablet by mouth 3 (three) times daily as needed.    . Cheyenne HBr (TRINTELLIX) 10 MG TABS tablet Take 1 tablet (10 mg total) by mouth daily. 30 tablet 1  . albuterol (VENTOLIN HFA) 108 (90 Base) MCG/ACT inhaler Inhale 1-2 puffs into the lungs as needed for wheezing. 18 g 11  . Biotin 10 MG CAPS Take by mouth.    . carvedilol (COREG) 6.25 MG tablet Take 1 tablet (6.25 mg total) by mouth 2 (two) times daily. 60 tablet 5  . Cholecalciferol (VITAMIN D3 PO) Take by mouth.    . clotrimazole (CLOTRIMAZOLE AF) 1 % cream Apply 1 application topically 2 (two) times daily. Inner thigh 60 g 12  . diclofenac sodium (VOLTAREN) 1 % GEL Apply 4 g topically 4 (four) times daily. Prn knees and 2 grams qid prn hands arthritis 100 g 11  . EPINEPHrine 0.3 mg/0.3 mL IJ SOAJ injection Inject 0.3 mLs (0.3 mg total) into the muscle as needed for anaphylaxis. 2 each 0  . furosemide (LASIX) 20 MG tablet TAKE 1 TABLET BY MOUTH EVERY DAY 90 tablet 1  . gabapentin (NEURONTIN) 400 MG capsule Limit 2 tablets in the a.m. and midday and 3 tablets each evening 630 capsule 3  . isosorbide mononitrate (IMDUR) 60 MG 24 hr tablet Take 1 tablet (60 mg total) by mouth daily. 90 tablet 2  . Magnesium Oxide 400 MG CAPS Take 1 capsule (400 mg total) by mouth daily. 30 capsule 6  . methadone (DOLOPHINE) 10 MG tablet Limit 1-2 tablets by mouth 2-3 times per day if tolerated 180 tablet 0  . nitroGLYCERIN (NITROSTAT) 0.4 MG SL tablet Place 1 tablet (0.4 mg total) under the tongue every 5 (five) minutes as needed for chest pain. 25 tablet 3  . potassium chloride (KLOR-CON) 10 MEQ tablet Take 10 mEq by mouth as needed. Only take when using Lasix    . rosuvastatin (CRESTOR) 10 MG tablet Take 1 tablet (10 mg total) by mouth daily. 90 tablet 3  . tiZANidine (ZANAFLEX) 4  MG tablet TAKE 1 TABLET BY MOUTH AT BEDTIME AS NEEDED FOR MUSCLE SPASMS. 90 tablet 1  . triamcinolone cream (KENALOG) 0.1 % Apply 1 application topically 2 (two) times daily. 80 g 0  . vitamin B-12 (CYANOCOBALAMIN) 500 MCG tablet Take by mouth.     No current facility-administered medications for this visit.     Musculoskeletal: Strength & Muscle Tone: UTA Gait & Station: UTA Patient leans: N/A  Psychiatric Specialty Exam: Review of Systems  Constitutional: Positive for appetite change and fatigue.  Musculoskeletal: Positive for back pain and myalgias.  Skin:       Reports superficial excoriation of face - healing - reports she scratched in her sleep  Psychiatric/Behavioral: Positive for sleep disturbance. The patient is nervous/anxious.   All other systems reviewed and are negative.   Last menstrual period 02/18/2016.There is no height or weight on file to calculate BMI.  General Appearance: UTA  Eye Contact:  UTA  Speech:  Clear and Coherent  Volume:  Normal  Mood:  Anxious  Affect:  UTA  Thought Process:  Goal Directed and Descriptions of Associations: Intact  Orientation:  Full (Time, Place, and Person)  Thought Content: Logical   Suicidal Thoughts:  No  Homicidal Thoughts:  No  Memory:  Immediate;   Fair Recent;   Fair Remote;   Fair  Judgement:  Fair  Insight:  Fair  Psychomotor Activity:  UTA  Concentration:  Concentration: Fair and Attention Span: Fair  Recall:  [employer] of Knowledge: Fair  Language: Fair  Akathisia:  No  Handed:  Right  AIMS (if indicated): UTA  Assets:  Communication Skills Desire for Improvement Housing Social Support  ADL's:  Intact  Cognition: WNL  Sleep:  Restless   Screenings: GAD-7   Flowsheet Row Video Visit from 02/15/2020 in [facility] Office Visit from 07/26/2019 in [facility] Office Visit from 09/21/2015 in [facility]  Total  GAD-7 Score _0 Mini-Mental   [facility] Office Visit from 10/30/2017 in [facility]  Total Score (max 30 points ) 27    PHQ2-9   Flowsheet Row Video Visit from 02/15/2020 in [facility] Office Visit from 07/26/2019 in [facility] Office Visit from 12/21/2018 in [facility] Office Visit from 08/18/2017 in [facility] Office Visit from 11/15/2015 in [facility]  PHQ-2 Total Score 1 5 0 0 0  PHQ-9 Total Score 3 19 - - -    Flowsheet Row Video Visit from 05/09/2020 in [facility] No Risk       Assessment and Plan: Cheyenne Gray is a 55 year old Caucasian female, widowed, has a history of PTSD, depression, anxiety, multiple medical problems including coronary artery disease, prolonged QT per history, PVC, gastric bypass, vitamin B12, vitamin D deficiency, chronic pain on methadone was evaluated by telemedicine today.  Patient is currently struggling with anxiety, lack of appetite , will benefit from medication readjustment, more frequent psychotherapy sessions.  Plan as noted below.  Plan PTSD-stable Continue CBT  MDD-stable Patient does not report depression however does struggle with lack of appetite which likely could be due to her mood symptoms. Will monitor closely We will coordinate care with her primary care provider.  Patient advised to reach out to her PMD for evaluation of appetite change.  However we will increase the Trintellix, antidepressant also.  GAD-unstable Will increase Trintellix to 10 mg p.o. daily I have reached out to her therapist to see patient in person in office since she has been having trouble doing psychotherapy sessions over the phone.  Patient will benefit from more intensive program like IOP if she continues to decompensate.  Insomnia-Restless Patient  is not interested in medication changes for her sleep problems. Patient wants to be aware of her surroundings and is not interested in anything that makes her too sleepy. Patient will work on sleep hygiene techniques.  Caffeine use disorder-improving We will monitor closely  I have reviewed medical records per Dr. Greenland Lambert -electrophysiology, [facility]-dated 04/18/2020-patient with hypertrophic cardiomyopathy, syncope.  Most recent EKG shows borderline prolonged QTC at 470 ms.  Would recommend implanting a cardiac defibrillator.  Will order a cardiac MRI.'  Follow-up in clinic for an in person visit in 2 to 3 weeks or sooner if needed.  I have spent atleast 30 minutes non face to face  with patient today.  More than 50 % of the time was spent for preparing to see the patient ( e.g., review of test, records ), obtaining and to review and separately obtained history , ordering medications and test ,psychoeducation and supportive psychotherapy and care coordination,as well as documenting clinical information in electronic health record. This note was generated in part or whole with voice recognition software. Voice recognition is usually quite accurate but there are transcription errors that can and very often do occur. I apologize for any typographical errors that were not detected and corrected.       Ursula Alert, MD 05/10/2020, 1:07 PM

## 2020-05-11 ENCOUNTER — Telehealth: Payer: Self-pay | Admitting: Internal Medicine

## 2020-05-11 ENCOUNTER — Encounter: Payer: Self-pay | Admitting: Internal Medicine

## 2020-05-11 NOTE — Telephone Encounter (Signed)
Per psychiatry  Jomarie Longs, MD  McLean-Scocuzza, Pasty Spillers, MD Patient may benefit from referral to nutritionist, does report problems with her food intake, have made medication changes, I have also advised her to come into the office to start in person visits with her psychotherapist as well as with me. I have reached out to her psychotherapist to start seeing her in the office. FYI.

## 2020-05-22 ENCOUNTER — Ambulatory Visit: Payer: Medicare Other | Admitting: Licensed Clinical Social Worker

## 2020-05-24 ENCOUNTER — Encounter: Payer: Self-pay | Admitting: Oncology

## 2020-05-24 DIAGNOSIS — D508 Other iron deficiency anemias: Secondary | ICD-10-CM

## 2020-05-28 NOTE — Telephone Encounter (Signed)
Done...  Pt will RTC on 05/30/20.Marland Kitchen Pt is aware.

## 2020-05-30 ENCOUNTER — Inpatient Hospital Stay: Payer: Medicare Other | Attending: Oncology

## 2020-06-01 ENCOUNTER — Telehealth: Payer: Self-pay | Admitting: Cardiology

## 2020-06-01 NOTE — Telephone Encounter (Signed)
Pt c/o of Chest Pain: STAT if CP now or developed within 24 hours  1. Are you having CP right now? No  2. Are you experiencing any other symptoms (ex. SOB, nausea, vomiting, sweating)? SOB, sweating, lightheadedness, headaches  3. How long have you been experiencing CP? About a week and a half  4. Is your CP continuous or coming and going? Coming and going  5. Have you taken Nitroglycerin? Yes  ?

## 2020-06-01 NOTE — Telephone Encounter (Signed)
Last seen by myself 1 year ago Recently seen by Dr. Lalla Brothers, looks like repeat MRI of the heart ordered Recommend we get the MRI done, she has follow-up with him to discuss results

## 2020-06-01 NOTE — Telephone Encounter (Signed)
Spoke with the patient who states that for the past week she has been feeling poorly. She states that she has been having bad headaches along with intermittent chest pressure. She states that she is moving soon and has been getting short of breath while packing. She states that the she had one episode recently of almost passing out. She had someone take her vitals a couple days ago and was told that her BP was high and heart rate was low, however she does not have those readings. She reports she has been eating well and staying hydrated. She is taking medications as prescribed. She is not currently having any chest pain, only some shortness of breath. She has not taken any nitroglycerin for the chest pain. States that she only takes nitro when chest pain is severe and radiating down her arm. She states that this pain has not felt like that. Advised patient that if chest pain returns she should take nitro. I have also advised patient to ER precautions.

## 2020-06-05 ENCOUNTER — Ambulatory Visit: Payer: Medicare Other | Admitting: Psychiatry

## 2020-06-11 ENCOUNTER — Telehealth (HOSPITAL_COMMUNITY): Payer: Self-pay | Admitting: *Deleted

## 2020-06-11 NOTE — Telephone Encounter (Signed)
Attempted to call patient regarding upcoming cardiac MRI appointment. Left message on voicemail with name and callback number  Aneri Slagel RN Navigator Cardiac Imaging [facility] [phone] Office [phone] Cell  

## 2020-06-12 ENCOUNTER — Ambulatory Visit (HOSPITAL_COMMUNITY)
Admission: RE | Admit: 2020-06-12 | Discharge: 2020-06-12 | Disposition: A | Discharge status: Home / Self Care | Payer: Medicare Other | Source: Ambulatory Visit | Attending: Cardiology | Admitting: Cardiology

## 2020-06-12 ENCOUNTER — Other Ambulatory Visit: Payer: Self-pay

## 2020-06-12 DIAGNOSIS — I422 Other hypertrophic cardiomyopathy: Secondary | ICD-10-CM | POA: Insufficient documentation

## 2020-06-12 MED ORDER — GADOBUTROL 1 MMOL/ML IV SOLN
10.0000 mL | Freq: Once | INTRAVENOUS | Status: AC | PRN
  Administered 2020-06-12: 10 mL via INTRAVENOUS

## 2020-06-14 ENCOUNTER — Emergency Department
Admission: EM | Admit: 2020-06-14 | Discharge: 2020-06-14 | Discharge status: Against Medical Advice | Payer: Medicare Other | Attending: Emergency Medicine | Admitting: Emergency Medicine

## 2020-06-14 ENCOUNTER — Other Ambulatory Visit: Payer: Self-pay

## 2020-06-14 ENCOUNTER — Emergency Department: Payer: Medicare Other

## 2020-06-14 DIAGNOSIS — R42 Dizziness and giddiness: Secondary | ICD-10-CM | POA: Insufficient documentation

## 2020-06-14 DIAGNOSIS — R079 Chest pain, unspecified: Secondary | ICD-10-CM | POA: Diagnosis present

## 2020-06-14 DIAGNOSIS — I5030 Unspecified diastolic (congestive) heart failure: Secondary | ICD-10-CM | POA: Diagnosis not present

## 2020-06-14 DIAGNOSIS — Z79899 Other long term (current) drug therapy: Secondary | ICD-10-CM | POA: Diagnosis not present

## 2020-06-14 DIAGNOSIS — I25118 Atherosclerotic heart disease of native coronary artery with other forms of angina pectoris: Secondary | ICD-10-CM | POA: Insufficient documentation

## 2020-06-14 DIAGNOSIS — I110 Hypertensive heart disease with heart failure: Secondary | ICD-10-CM | POA: Insufficient documentation

## 2020-06-14 DIAGNOSIS — R0602 Shortness of breath: Secondary | ICD-10-CM | POA: Diagnosis not present

## 2020-06-14 DIAGNOSIS — I200 Unstable angina: Secondary | ICD-10-CM

## 2020-06-14 LAB — CBC WITH DIFFERENTIAL/PLATELET
Abs Immature Granulocytes: 0.03 10*3/uL (ref 0.00–0.07)
Basophils Absolute: 0.1 10*3/uL (ref 0.0–0.1)
Basophils Relative: 1 %
Eosinophils Absolute: 0.2 10*3/uL (ref 0.0–0.5)
Eosinophils Relative: 2 %
HCT: 42.1 % (ref 36.0–46.0)
Hemoglobin: 14 g/dL (ref 12.0–15.0)
Immature Granulocytes: 0 %
Lymphocytes Relative: 33 %
Lymphs Abs: 3.5 10*3/uL (ref 0.7–4.0)
MCH: 31 pg (ref 26.0–34.0)
MCHC: 33.3 g/dL (ref 30.0–36.0)
MCV: 93.3 fL (ref 80.0–100.0)
Monocytes Absolute: 1 10*3/uL (ref 0.1–1.0)
Monocytes Relative: 9 %
Neutro Abs: 5.7 10*3/uL (ref 1.7–7.7)
Neutrophils Relative %: 55 %
Platelets: 235 10*3/uL (ref 150–400)
RBC: 4.51 MIL/uL (ref 3.87–5.11)
RDW: 12.5 % (ref 11.5–15.5)
WBC: 10.5 10*3/uL (ref 4.0–10.5)
nRBC: 0 % (ref 0.0–0.2)

## 2020-06-14 LAB — TROPONIN I (HIGH SENSITIVITY)
Troponin I (High Sensitivity): 14 ng/L (ref <18)
Troponin I (High Sensitivity): 15 ng/L (ref <18)

## 2020-06-14 LAB — BASIC METABOLIC PANEL
Anion gap: 9 (ref 5–15)
BUN: 9 mg/dL (ref 6–20)
CO2: 26 mmol/L (ref 22–32)
Calcium: 9.1 mg/dL (ref 8.9–10.3)
Chloride: 106 mmol/L (ref 98–111)
Creatinine, Ser: 0.79 mg/dL (ref 0.44–1.00)
GFR, Estimated: >60 mL/min (ref >60)
Glucose, Bld: 91 mg/dL (ref 70–99)
Potassium: 3 mmol/L — ABNORMAL LOW (ref 3.5–5.1)
Sodium: 141 mmol/L (ref 135–145)

## 2020-06-14 MED ORDER — NITROGLYCERIN 0.4 MG SL SUBL
0.4000 mg | SUBLINGUAL_TABLET | SUBLINGUAL | Status: DC | PRN
  Administered 2020-06-14: 0.4 mg via SUBLINGUAL
  Filled 2020-06-14: qty 1

## 2020-06-14 NOTE — ED Notes (Signed)
Patient reports improvement to chest pain.

## 2020-06-14 NOTE — ED Notes (Signed)
ED Provider at bedside. 

## 2020-06-14 NOTE — ED Notes (Signed)
Wheelchair offered, patient declined.

## 2020-06-14 NOTE — ED Provider Notes (Signed)
[facility] Emergency Department Provider Note ____________________________________________   Event Date/Time   First MD Initiated Contact with Patient 06/14/20 1956     (approximate)  I have reviewed the triage vital signs and the nursing notes.   HISTORY  Chief Complaint Chest Pain    HPI Cheyenne Gray is a 55 y.o. female with PMH as noted below including CAD status post stents, hypertrophic cardiomyopathy, and diastolic CHF who presents with chest pain.  Onset was acute this morning although it was mild throughout the day.  The patient then developed more severe pain approximately 1.5 hours ago.  She describes the pain as a sensation of an elephant on her chest.  She had some associated lightheadedness and shortness of breath but denies nausea or vomiting.  The pain does not radiate.  She reports that she has been moving all day.  She did not have any pain yesterday although did have a syncopal event a few days ago.  The patient had a cardiac MRI a few days ago to evaluate her HCM although has not discussed the results with her doctor.  Past Medical History:  Diagnosis Date  . (HFpEF) heart failure with preserved ejection fraction (HCC)    a. Echo 2014: EF 65-70%, nl WM, mildly dilated LA, PASP nl; b. 12/2014 Echo: EF 65-70%, no rwma, mod septal hypertrophy w/o LVOT gradient or SAM; c. 07/2017 Echo: EF 55-60%, no rwma, mildly dil RV w/ nl syst fxn. Mildly dil RA. Dilated IVC w/ elevated CVP. Triv post effusion.  Marland Kitchen Anxiety   . Arthritis   . Asthma   . B12 deficiency   . Chronic back pain   . Chronic headaches   . Chronic pain    a. on methadone  . Concussion    hx of 4  . Coronary artery disease, non-occlusive    a. LHC 1/18: proximal to mid LAD 40% stenosed, mid LAD 30% stenosed, mid RCA 20% stenosed, distal RCA 20% stenosed, EF 55-65%, LVEDP normal  . Depression   . DJD (degenerative joint disease), multiple sites   . Frequent headaches   .  Gallstone   . H/O non-ST elevation myocardial infarction (NSTEMI)   . Hand, foot and mouth disease 2016  . History of shingles   . Hypertension   . Iron deficiency anemia   . Long QT interval   . Methadone use    managed by Dr. Metta Clines  . Migraine    1x/mo  . Motion sickness    cars  . Obesity   . Palpitations    a. 24 hour Holter: NSR, sinus brady down to 48, occasional PVCs & couplets, 8 beats NSVT; b. 30 day event monitor 2015: NSR with rare PVC.  Marland Kitchen Psoriasis   . Syncope and collapse   . Vitamin D deficiency   . Wears dentures    full upper and lower    Patient Active Problem List   Diagnosis Date Noted  . Insomnia due to medical condition 05/09/2020  . FH: pancreatic cancer 12/29/2019  . Depression, recurrent (HCC) 12/29/2019  . Hand pain, right 11/15/2019  . Homeless single person 11/15/2019  . Closed fracture of right wrist 11/15/2019  . MDD (major depressive disorder), recurrent, in partial remission (HCC) 08/22/2019  . Essential hypertension 07/26/2019  . Chronic pain of both knees 07/26/2019  . Asthma 07/26/2019  . Chronic midline low back pain with sciatica 07/26/2019  . Lumbar herniated disc 07/26/2019  . Migraine without aura  and without status migrainosus, not intractable 07/26/2019  . Morbid obesity with BMI of 40.0-44.9, adult (HCC) 07/26/2019  . Noncompliance with treatment regimen 05/16/2019  . Upper abdominal pain 05/04/2019  . Dizziness 05/04/2019  . Hand eczema 04/26/2019  . At risk for long QT syndrome 12/27/2018  . Obesity (BMI 30-39.9) 12/21/2018  . Excess skin 12/21/2018  . Allergy to honey bee venom 12/21/2018  . Abnormal CT scan, colon   . PTSD (post-traumatic stress disorder) 09/16/2018  . GAD (generalized anxiety disorder) 09/16/2018  . MDD (major depressive disorder), recurrent episode, moderate (HCC) 09/16/2018  . 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  . Hot flashes due to menopause 09/09/2018  . Memory loss 09/09/2018  . Intertrigo  09/09/2018  . Fatigue 07/14/2018  . Insomnia due to mental condition 07/14/2018  . Abnormal CT of the abdomen 07/14/2018  . Coronary artery disease of native artery of native heart with stable angina pectoris (HCC) 06/09/2018  . Abnormal MRI, lumbar spine 03/30/2018  . Abnormal MRI, thoracic spine 03/30/2018  . 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/30/2018  . Hypokalemia 03/30/2018  . Edema 10/01/2017  . Anxiety and depression 09/28/2017  . Vitamin D deficiency 09/28/2017  . Chronic heart failure with preserved ejection fraction (HCC) 08/18/2017  . Chest pain   . Positive cardiac stress test   . Spinal stenosis, lumbar region, with neurogenic claudication 10/18/2015  . Lumbar radiculopathy 10/18/2015  . Migraine 09/21/2015  . Chronic tension-type headache, intractable 08/08/2015  . Medication overuse headache 08/08/2015  . Bilateral occipital neuralgia 06/04/2015  . Migraine headache 06/04/2015  . Angina pectoris (HCC) 02/07/2015  . IDA (iron deficiency anemia) 09/19/2014  . 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 DDD (degenerative disc disease), thoracolumbar 09/14/2014  . Sacroiliac joint dysfunction 09/14/2014  . Facet syndrome, lumbar 09/14/2014  . Mixed hyperlipidemia 01/25/2014  . PVC (premature ventricular contraction) 01/10/2014  . CAD (coronary artery disease) 04/22/2013  . PVD (peripheral vascular disease) (HCC) 04/22/2013  . Long Q-T syndrome 07/09/2012  . Chest pressure 07/09/2012  . NSVT (nonsustained ventricular tachycardia) (HCC) 07/09/2012  . Syncope 07/09/2012  . Muscle spasm 07/20/2009    Past Surgical History:  Procedure Laterality Date  . ABDOMINOPLASTY     tummy tuck ? year   . BARIATRIC SURGERY  2001  . CARDIAC CATHETERIZATION Left 04/16/2016   Procedure: Left Heart Cath and Coronary Angiography;  Surgeon: Antonieta Iba, MD;  Location: [facility] INVASIVE CV LAB;  Service: Cardiovascular;  Laterality: Left;  . CHOLECYSTECTOMY  2001  . COLONOSCOPY WITH  PROPOFOL N/A 09/28/2018   Procedure: COLONOSCOPY WITH PROPOFOL;  Surgeon: Pasty Spillers, MD;  Location: [facility];  Service: Endoscopy;  Laterality: N/A;  . ESOPHAGOGASTRODUODENOSCOPY (EGD) WITH PROPOFOL N/A 09/28/2018   Procedure: ESOPHAGOGASTRODUODENOSCOPY (EGD) WITH BIOPSY;  Surgeon: Pasty Spillers, MD;  Location: [facility];  Service: Endoscopy;  Laterality: N/A;  . GALLBLADDER SURGERY    . GASTRIC BYPASS  2001  . GASTROPLASTY      Prior to Admission medications   Medication Sig Start Date End Date Taking? Authorizing Provider  albuterol (VENTOLIN HFA) 108 (90 Base) MCG/ACT inhaler Inhale 1-2 puffs into the lungs as needed for wheezing. 09/04/19   McLean-Scocuzza, Pasty Spillers, MD  baclofen (LIORESAL) 10 MG tablet Take 1 tablet by mouth 3 (three) times daily as needed. 05/07/20   [provider]  Biotin 10 MG CAPS Take by mouth.    [provider]  carvedilol (COREG) 6.25 MG tablet Take 1 tablet (6.25  mg total) by mouth 2 (two) times daily. 02/02/20   Marisue Ivan D, PA-C  Cholecalciferol (VITAMIN D3 PO) Take by mouth.    [provider]  clotrimazole (CLOTRIMAZOLE AF) 1 % cream Apply 1 application topically 2 (two) times daily. Inner thigh 07/26/19   McLean-Scocuzza, Pasty Spillers, MD  diclofenac sodium (VOLTAREN) 1 % GEL Apply 4 g topically 4 (four) times daily. Prn knees and 2 grams qid prn hands arthritis 05/05/18   McLean-Scocuzza, Pasty Spillers, MD  EPINEPHrine 0.3 mg/0.3 mL IJ SOAJ injection Inject 0.3 mLs (0.3 mg total) into the muscle as needed for anaphylaxis. 12/21/18   McLean-Scocuzza, Pasty Spillers, MD  furosemide (LASIX) 20 MG tablet TAKE 1 TABLET BY MOUTH EVERY DAY 04/24/20   Lanier Prude, MD  gabapentin (NEURONTIN) 400 MG capsule Limit 2 tablets in the a.m. and midday and 3 tablets each evening 06/07/19   McLean-Scocuzza, Pasty Spillers, MD  isosorbide mononitrate (IMDUR) 60 MG 24 hr tablet Take 1 tablet (60 mg total) by mouth daily. 05/30/19  02/02/20  Creig Hines, NP  Magnesium Oxide 400 MG CAPS Take 1 capsule (400 mg total) by mouth daily. 02/03/20   Marisue Ivan D, PA-C  methadone (DOLOPHINE) 10 MG tablet Limit 1-2 tablets by mouth 2-3 times per day if tolerated 11/15/15   Ewing Schlein, MD  nitroGLYCERIN (NITROSTAT) 0.4 MG SL tablet Place 1 tablet (0.4 mg total) under the tongue every 5 (five) minutes as needed for chest pain. 02/02/20 02/01/21  Marisue Ivan D, PA-C  potassium chloride (KLOR-CON) 10 MEQ tablet Take 10 mEq by mouth as needed. Only take when using Lasix    [provider]  rosuvastatin (CRESTOR) 10 MG tablet Take 1 tablet (10 mg total) by mouth daily. 05/27/19   Antonieta Iba, MD  tiZANidine (ZANAFLEX) 4 MG tablet TAKE 1 TABLET BY MOUTH AT BEDTIME AS NEEDED FOR MUSCLE SPASMS. 01/10/20   McLean-Scocuzza, Pasty Spillers, MD  triamcinolone cream (KENALOG) 0.1 % Apply 1 application topically 2 (two) times daily. 04/26/19   McLean-Scocuzza, Pasty Spillers, MD  vitamin B-12 (CYANOCOBALAMIN) 500 MCG tablet Take by mouth. 11/03/17   [provider]  vortioxetine HBr (TRINTELLIX) 10 MG TABS tablet Take 1 tablet (10 mg total) by mouth daily. 05/09/20   Jomarie Longs, MD    Allergies Covid-19 (mrna) vaccine Fransisca Kaufmann) Ashley Murrain (mrna) vaccine], Penicillins, and Bee venom  Family History  Problem Relation Age of Onset  . Heart attack Mother   . Cancer Mother        pancreatitic 30  . Early death Mother   . Heart attack Father 67       MI  . Early death Father   . Heart disease Father   . Heart attack Brother   . Heart disease Brother   . Arthritis Brother   . Depression Brother   . Diabetes Brother   . Heart attack Maternal Grandmother   . Cancer Maternal Grandmother        pancreatitic   . Heart disease Maternal Grandmother   . Lung cancer Maternal Grandmother   . Pancreatic cancer Maternal Grandmother   . Cancer Maternal Uncle        pancreatitic   . Cancer Paternal Grandmother         ? type   . Diabetes Paternal Grandmother   . Cancer Maternal Uncle        pancreatitic   . Breast cancer Maternal Aunt   . Cancer Maternal Aunt  GYN  . Cancer Maternal Aunt        GYN    Social History Social History   Tobacco Use  . Smoking status: Never Smoker  . Smokeless tobacco: Never Used  Vaping Use  . Vaping Use: Never used  Substance Use Topics  . Alcohol use: No    Alcohol/week: 0.0 standard drinks    Comment: holidays  . Drug use: No    Review of Systems  Constitutional: No fever. Eyes: No visual changes. ENT: No sore throat. Cardiovascular: Positive for chest pain. Respiratory: Positive for mild shortness of breath. Gastrointestinal: No vomiting or diarrhea.  Genitourinary: Negative for dysuria.  Musculoskeletal: Negative for back pain. Skin: Negative for rash. Neurological: Negative for headache.   ____________________________________________   PHYSICAL EXAM:  VITAL SIGNS: ED Triage Vitals  Enc Vitals Group     BP 06/14/20 1950 (!) 158/94     Pulse Rate 06/14/20 1950 72     Resp 06/14/20 1950 18     Temp 06/14/20 1950 97.9 F (36.6 C)     Temp Source 06/14/20 1950 Oral     SpO2 06/14/20 1950 98 %     Weight 06/14/20 1954 240 lb (108.9 kg)     Height 06/14/20 1954 5\' 6"  (1.676 m)     Head Circumference --      Peak Flow --      Pain Score 06/14/20 1953 0     Pain Loc --      Pain Edu? --      Excl. in GC? --     Constitutional: Alert and oriented.  Relatively well appearing and in no acute distress. Eyes: Conjunctivae are normal.  Head: Atraumatic. Nose: No congestion/rhinnorhea. Mouth/Throat: Mucous membranes are moist.   Neck: Normal range of motion.  Cardiovascular: Normal rate, regular rhythm. Grossly normal heart sounds.  Good peripheral circulation. Respiratory: Normal respiratory effort.  No retractions. Lungs CTAB. Gastrointestinal: No distention.  Musculoskeletal: No lower extremity edema.  Extremities warm and  well perfused.  Neurologic:  Normal speech and language. No gross focal neurologic deficits are appreciated.  Skin:  Skin is warm and dry. No rash noted. Psychiatric: Mood and affect are normal. Speech and behavior are normal.  ____________________________________________   LABS (all labs ordered are listed, but only abnormal results are displayed)  Labs Reviewed  BASIC METABOLIC PANEL - Abnormal; Notable for the following components:      Result Value   Potassium 3.0 (*)    All other components within normal limits  CBC WITH DIFFERENTIAL/PLATELET  TROPONIN I (HIGH SENSITIVITY)   ____________________________________________  EKG  ED ECG REPORT I, 06/16/20, the attending physician, personally viewed and interpreted this ECG.  Date: 06/14/2020 EKG Time: 1952 Rate: 58 Rhythm: normal sinus rhythm QRS Axis: normal Intervals: normal ST/T Wave abnormalities: normal Narrative Interpretation: no evidence of acute ischemia  ____________________________________________  RADIOLOGY  Chest x-ray interpreted by me shows no focal consolidation or edema  ____________________________________________   PROCEDURES  Procedure(s) performed: No  Procedures  Critical Care performed: No ____________________________________________   INITIAL IMPRESSION / ASSESSMENT AND PLAN / ED COURSE  Pertinent labs & imaging results that were available during my care of the patient were reviewed by me and considered in my medical decision making (see chart for details).  55 year old female with PMH as noted above including CAD status post stents, diastolic CHF, HCM, hypertension, migraine, and recurrent syncope presents with an episode of chest pain.  The patient has been moving  over the last few days so has been exerting herself more than usual.  However the chest pain was not specifically associated with exertion.  The patient took multiple doses of nitroglycerin and 324 mg of aspirin  prior to arrival with significant improvement in her pain.  I reviewed the past medical records in epic.  The patient has been evaluated for hypertrophic cardiomyopathy and had a cardiac MRI on 3/29 which showed findings of HCM asymmetric septal variant.  There is a nonischemic gadolinium enhancement pattern noted.  She has had no recent ED visits or admissions within the last 2 years.  She has a long history of syncopal episodes with brief prodrome.  On exam the patient is overall well-appearing.  Her vital signs are normal except for mild hypertension.  The physical exam is otherwise unremarkable.  EKG is nonischemic.  Differential includes ACS, stable angina, musculoskeletal pain, GERD, or other benign etiology.  We will obtain basic labs, cardiac enzymes x2, chest x-ray, and reassess.  ----------------------------------------- 8:52 PM on 06/14/2020 -----------------------------------------  Initial troponin is negative.  The plan will be repeat in 2 hours.  I signed the patient out to the oncoming ED physician Dr. Katrinka BlazingSmith.  ____________________________________________   FINAL CLINICAL IMPRESSION(S) / ED DIAGNOSES  Final diagnoses:  Chest pain, unspecified type      NEW MEDICATIONS STARTED DURING THIS VISIT:  New Prescriptions   No medications on file     Note:  This document was prepared using Dragon voice recognition software and may include unintentional dictation errors.    Dionne BucySiadecki, Carin Shipp, MD 06/14/20 2053

## 2020-06-14 NOTE — ED Notes (Signed)
Patient is resting comfortably. 

## 2020-06-14 NOTE — ED Provider Notes (Signed)
I assumed care of this patient approximately 8:30 PM.  In brief patient presents for assessment of some intermittent chest pains in the setting of known coronary artery disease, hypertrophic cardiomyopathy, and CHF.  On arrival she is afebrile hemodynamically stable.  Initial troponin and ECG are reassuring.  Plan is to obtain repeat troponin and if this is normal likely discharge.  Polypectomies reassuring patient on my assessment does have some intermittent left chest tightness.  She was given some nitro which helped a little bit but she is not pain-free on subsequent reassessments.  And concern for possible unstable angina is repeat ECG is largely stable from prior.  Advised patient recommended admission for observation she is at very high risk for heart attack although patient strongly stated she wished to be discharged home against my advice.  States she will call her cardiologist first thing in the morning.  I was unable to convince her to change her mind.  Epic she had a capacity make this decision.  She was discharged AMA.   Gilles Chiquito, MD 06/14/20 [phone]

## 2020-06-14 NOTE — ED Triage Notes (Signed)
Patient was brought in via EMS for chest pain that began severely at around 1846. Patient states she had chest pain throughout the day, but didn't think anything of it. Pt has an extensive cardiac history with previous MI and had a cardiac MRI yesterday. Pt took 3 Nitro and 324 of Aspirin prior to EMS arrival.

## 2020-06-15 ENCOUNTER — Other Ambulatory Visit: Payer: Self-pay | Admitting: Cardiovascular Disease

## 2020-06-15 NOTE — Telephone Encounter (Signed)
Rx request sent to pharmacy.  

## 2020-06-16 ENCOUNTER — Other Ambulatory Visit: Payer: Self-pay | Admitting: Internal Medicine

## 2020-06-16 DIAGNOSIS — M1990 Unspecified osteoarthritis, unspecified site: Secondary | ICD-10-CM

## 2020-06-18 ENCOUNTER — Other Ambulatory Visit: Payer: Self-pay | Admitting: Internal Medicine

## 2020-06-18 ENCOUNTER — Other Ambulatory Visit: Payer: Self-pay | Admitting: *Deleted

## 2020-06-18 ENCOUNTER — Other Ambulatory Visit: Payer: Self-pay | Admitting: Psychiatry

## 2020-06-18 DIAGNOSIS — M62838 Other muscle spasm: Secondary | ICD-10-CM

## 2020-06-18 DIAGNOSIS — T148XXA Other injury of unspecified body region, initial encounter: Secondary | ICD-10-CM

## 2020-06-18 DIAGNOSIS — M1990 Unspecified osteoarthritis, unspecified site: Secondary | ICD-10-CM

## 2020-06-18 DIAGNOSIS — F411 Generalized anxiety disorder: Secondary | ICD-10-CM

## 2020-06-18 DIAGNOSIS — F4310 Post-traumatic stress disorder, unspecified: Secondary | ICD-10-CM

## 2020-06-18 MED ORDER — ISOSORBIDE MONONITRATE ER 60 MG PO TB24: 60.0000 mg | ORAL_TABLET | Freq: Every day | ORAL | 0 refills | Status: DC

## 2020-06-22 ENCOUNTER — Inpatient Hospital Stay: Payer: Medicare Other | Attending: Oncology

## 2020-06-27 ENCOUNTER — Encounter: Payer: Self-pay | Admitting: Psychiatry

## 2020-06-27 ENCOUNTER — Telehealth (INDEPENDENT_AMBULATORY_CARE_PROVIDER_SITE_OTHER): Payer: Medicare Other | Admitting: Psychiatry

## 2020-06-27 ENCOUNTER — Other Ambulatory Visit: Payer: Self-pay

## 2020-06-27 DIAGNOSIS — F4310 Post-traumatic stress disorder, unspecified: Secondary | ICD-10-CM

## 2020-06-27 DIAGNOSIS — F331 Major depressive disorder, recurrent, moderate: Secondary | ICD-10-CM

## 2020-06-27 DIAGNOSIS — F411 Generalized anxiety disorder: Secondary | ICD-10-CM | POA: Diagnosis not present

## 2020-06-27 DIAGNOSIS — F1590 Other stimulant use, unspecified, uncomplicated: Secondary | ICD-10-CM

## 2020-06-27 DIAGNOSIS — G4701 Insomnia due to medical condition: Secondary | ICD-10-CM

## 2020-06-27 MED ORDER — VORTIOXETINE HBR 10 MG PO TABS: 10.0000 mg | ORAL_TABLET | Freq: Every day | ORAL | 1 refills | Status: DC

## 2020-06-27 NOTE — Progress Notes (Signed)
Virtual Visit via Telephone Note  I connected with Cheyenne Gray on 06/27/20 at  3:00 PM EDT by telephone and verified that I am speaking with the correct person using two identifiers.  Location Provider Location : [facility] Patient Location : Home  Participants: Patient , Provider   I discussed the limitations, risks, security and privacy concerns of performing an evaluation and management service by telephone and the availability of in person appointments. I also discussed with the patient that there may be a patient responsible charge related to this service. The patient expressed understanding and agreed to proceed.    I discussed the assessment and treatment plan with the patient. The patient was provided an opportunity to ask questions and all were answered. The patient agreed with the plan and demonstrated an understanding of the instructions.   The patient was advised to call back or seek an in-person evaluation if the symptoms worsen or if the condition fails to improve as anticipated.   [place] [facility] MD OP Progress Note  06/27/2020 3:51 PM [place]  MRN:  [id number]  Chief Complaint:  Chief Complaint    Follow-up; Anxiety     HPI: Cheyenne Gray is a 55 year old Caucasian female, widowed, on disability, has a history of PTSD, MDD, GAD, insomnia, caffeine use disorder, history of prolonged QT syndrome, chronic back pain on methadone, vitamin B12 deficiency, heart failure, iron deficiency, vitamin D deficiency, history of gastric bypass, MI, hypertension was evaluated by telemedicine today.  Patient today reports she was able to relocate to a new apartment complex [place].  Patient reports it took her a few weeks to get everything to her new home.  Finally she got settled down yesterday.  She reports she lives in a  area with a lot of resources .  She has not made any friends yet.  She hopes this works out for her.  Patient reports she had to go to the emergency  department for chest pain and was advised to follow-up with her cardiologist.  She does have upcoming appointment coming up.  She currently denies any chest pain or other physical symptoms.  Patient is compliant on the Trintellix.  She reports she believes it is helping however she was stressed out recently with the moving and so on.  She hence wants to give the Trintellix more time.  Patient denies any suicidality, homicidality or perceptual disturbances.  Patient reports she has been sleeping 4 to 5 hours since the past few nights which is good for her.  Patient denies any other concerns today.  Visit Diagnosis:    ICD-10-CM   1. PTSD (post-traumatic stress disorder)  F43.10   2. GAD (generalized anxiety disorder)  F41.1   3. MDD (major depressive disorder), recurrent episode, moderate (HCC)  F33.1 vortioxetine HBr (TRINTELLIX) 10 MG TABS tablet   Improving  4. Insomnia due to medical condition  G47.01   5. Caffeine use disorder  F15.90     Past Psychiatric History: I have reviewed past psychiatric history from my progress note on 10/17/2017.  Past trials of Paxil, Cymbalta, Valium, melatonin, trazodone, Ambien, Wellbutrin, Abilify  Past Medical History:  Past Medical History:  Diagnosis Date  . (HFpEF) heart failure with preserved ejection fraction ([place])    a. Echo 2014: EF 65-70%, nl WM, mildly dilated LA, PASP nl; b. 12/2014 Echo: EF 65-70%, no rwma, mod septal hypertrophy w/o LVOT gradient or SAM; c. 07/2017 Echo: EF 55-60%, no rwma, mildly dil RV w/ nl  syst fxn. Mildly dil RA. Dilated IVC w/ elevated CVP. Triv post effusion.  Marland Kitchen Anxiety   . Arthritis   . Asthma   . B12 deficiency   . Chronic back pain   . Chronic headaches   . Chronic pain    a. on methadone  . Concussion    hx of 4  . Coronary artery disease, non-occlusive    a. LHC 1/18: proximal to mid LAD 40% stenosed, mid LAD 30% stenosed, mid RCA 20% stenosed, distal RCA 20% stenosed, EF 55-65%, LVEDP normal  .  Depression   . DJD (degenerative joint disease), multiple sites   . Frequent headaches   . Gallstone   . H/O non-ST elevation myocardial infarction (NSTEMI)   . Hand, foot and mouth disease 2016  . History of shingles   . Hypertension   . Iron deficiency anemia   . Long QT interval   . Methadone use    managed by Dr. Primus Bravo  . Migraine    1x/mo  . Motion sickness    cars  . Obesity   . Palpitations    a. 24 hour Holter: NSR, sinus brady down to 48, occasional PVCs & couplets, 8 beats NSVT; b. 30 day event monitor 2015: NSR with rare PVC.  Marland Kitchen Psoriasis   . Syncope and collapse   . Vitamin D deficiency   . Wears dentures    full upper and lower    Past Surgical History:  Procedure Laterality Date  . ABDOMINOPLASTY     tummy tuck ? year   . [place] SURGERY  2001  . CARDIAC CATHETERIZATION Left 04/16/2016   Procedure: Left Heart Cath and Coronary Angiography;  Surgeon: [name], MD;  Location: [facility];  Service: Cardiovascular;  Laterality: Left;  . CHOLECYSTECTOMY  2001  . COLONOSCOPY WITH PROPOFOL N/A 09/28/2018   Procedure: COLONOSCOPY WITH PROPOFOL;  Surgeon: Virgel Manifold, MD;  Location: [facility];  Service: Endoscopy;  Laterality: N/A;  . ESOPHAGOGASTRODUODENOSCOPY (EGD) WITH PROPOFOL N/A 09/28/2018   Procedure: ESOPHAGOGASTRODUODENOSCOPY (EGD) WITH BIOPSY;  Surgeon: Virgel Manifold, MD;  Location: [facility];  Service: Endoscopy;  Laterality: N/A;  . GALLBLADDER SURGERY    . GASTRIC BYPASS  2001  . GASTROPLASTY      Family Psychiatric History: I have reviewed family psychiatric history from my progress note on 10/17/2017  Family History:  Family History  Problem Relation Age of Onset  . Heart attack Mother   . Cancer Mother        pancreatitic 84  . Early death Mother   . Heart attack Father 22       MI  . Early death Father   . Heart disease Father   . Heart attack Brother   . Heart disease Brother   .  Arthritis Brother   . Depression Brother   . Diabetes Brother   . Heart attack Maternal Grandmother   . Cancer Maternal Grandmother        pancreatitic   . Heart disease Maternal Grandmother   . Lung cancer Maternal Grandmother   . Pancreatic cancer Maternal Grandmother   . Cancer Maternal Uncle        pancreatitic   . Cancer Paternal Grandmother        ? type   . Diabetes Paternal Grandmother   . Cancer Maternal Uncle        pancreatitic   . Breast cancer Maternal Aunt   . Cancer  Maternal Aunt        GYN  . Cancer Maternal Aunt        GYN    Social History: I have reviewed social history from my progress note on 10/17/2017 Social History   Socioeconomic History  . Marital status: Widowed    Spouse name: Not on file  . Number of children: 1  . Years of education: assoc degree  . Highest education level: Associate degree: occupational, Hotel manager, or vocational program  Occupational History  . Not on file  Tobacco Use  . Smoking status: Never Smoker  . Smokeless tobacco: Never Used  Vaping Use  . Vaping Use: Never used  Substance and Sexual Activity  . Alcohol use: No    Alcohol/week: 0.0 standard drinks    Comment: holidays  . Drug use: No  . Sexual activity: Not Currently  Other Topics Concern  . 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 Covid-19 (Mrna) Vaccine Therapist, music) [Covid-19 (Mrna) Vaccine] Itching, Palpitations and Other (See Comments)    Throat  closing.   Marland Kitchen Penicillins Hives, Shortness Of Breath and Rash    Has patient had a PCN reaction causing immediate rash, facial/tongue/throat swelling, SOB or lightheadedness with hypotension: Yes Has patient had a PCN reaction causing severe rash involving mucus membranes or skin necrosis: No Has patient had a PCN reaction that required hospitalization No Has patient had a PCN reaction occurring within the last 10 years: No If all of the above answers are "NO", then may proceed with Cephalosporin use.   Raelyn Ensign Venom     Metabolic Disorder Labs: Lab Results  Component Value Date   HGBA1C 5.3 09/01/2018   No results found for: PROLACTIN Lab Results  Component Value Date   CHOL 114 12/07/2019   TRIG 63 12/07/2019   HDL 54 12/07/2019   CHOLHDL 2.1 12/07/2019   VLDL 13 12/07/2019   LDLCALC 47 12/07/2019   LDLCALC 95 09/01/2018   Lab Results  Component Value Date   TSH 3.915 12/07/2019   TSH 3.250 11/09/2019    Therapeutic Level Labs: No results found for: LITHIUM No results found for: VALPROATE No components found for:  CBMZ  Current Medications: Current Outpatient Medications  Medication Sig Dispense Refill  . albuterol (VENTOLIN HFA) 108 (90 Base) MCG/ACT inhaler Inhale 1-2 puffs into the lungs as needed for wheezing. 18 g 11  . baclofen (LIORESAL) 10 MG tablet Take 1 tablet by mouth 3 (three) times daily as needed.    . Biotin 10 MG CAPS Take by mouth.    . carvedilol (COREG) 6.25 MG tablet Take 1 tablet (6.25 mg total) by mouth 2 (two) times daily. 60 tablet 5  . Cholecalciferol (VITAMIN D3 PO) Take by mouth.    . clotrimazole (CLOTRIMAZOLE AF) 1 % cream Apply 1 application topically 2 (two) times daily. [place]  thigh 60 g 12  . diclofenac sodium (VOLTAREN) 1 % GEL Apply 4 g topically 4 (four) times daily. Prn knees and 2 grams qid prn hands arthritis 100 g 11  . diclofenac Sodium (VOLTAREN) 1 % GEL Apply topically.    Marland Kitchen EPINEPHrine 0.3 mg/0.3 mL IJ SOAJ injection Inject  0.3 mLs (0.3 mg total) into the muscle as needed for anaphylaxis. 2 each 0  . furosemide (LASIX) 20 MG tablet TAKE 1 TABLET BY MOUTH EVERY DAY 90 tablet 1  . gabapentin (NEURONTIN) 400 MG capsule LIMIT 2 TABLETS IN THE A.M. AND MIDDAY AND 3 TABLETS EACH EVENING 630 capsule 3  . isosorbide mononitrate (IMDUR) 60 MG 24 hr tablet Take 1 tablet (60 mg total) by mouth daily. 90 tablet 0  . Magnesium Oxide 400 MG CAPS Take 1 capsule (400 mg total) by mouth daily. 30 capsule 6  . methadone (DOLOPHINE) 10 MG tablet Limit 1-2 tablets by mouth 2-3 times per day if tolerated 180 tablet 0  . nitroGLYCERIN (NITROSTAT) 0.4 MG SL tablet Place 1 tablet (0.4 mg total) under the tongue every 5 (five) minutes as needed for chest pain. 25 tablet 3  . potassium chloride (KLOR-CON) 10 MEQ tablet Take 10 mEq by mouth as needed. Only take when using Lasix    . rosuvastatin (CRESTOR) 10 MG tablet TAKE 1 TABLET BY MOUTH EVERY DAY 90 tablet 3  . tiZANidine (ZANAFLEX) 4 MG tablet TAKE 1 TABLET BY MOUTH AT BEDTIME AS NEEDED FOR MUSCLE SPASMS. 90 tablet 1  . triamcinolone cream (KENALOG) 0.1 % Apply 1 application topically 2 (two) times daily. 80 g 0  . vitamin B-12 (CYANOCOBALAMIN) 500 MCG tablet Take by mouth.    . vortioxetine HBr (TRINTELLIX) 10 MG TABS tablet Take 1 tablet (10 mg total) by mouth daily. 30 tablet 1   No current facility-administered medications for this visit.     Musculoskeletal: Strength & Muscle Tone: UTA Gait & Station: UTA Patient leans: N/A  Psychiatric Specialty Exam: Review of Systems  Constitutional: Positive for fatigue.  Psychiatric/Behavioral: The patient is nervous/anxious.   All other systems reviewed and are negative.   Last menstrual period 02/18/2016.There is no height or weight on file to calculate BMI.  General Appearance: UTA  Eye Contact:  UTA  Speech:  Normal Rate  Volume:  Decreased  Mood:  Anxious  Affect:  UTA  Thought Process:  Goal Directed and Descriptions of  Associations: Intact  Orientation:  Full (Time, Place, and Person)  Thought Content: Logical   Suicidal Thoughts:  No  Homicidal Thoughts:  No  Memory:  Immediate;   Fair Recent;   Fair Remote;   Fair  Judgement:  Fair  Insight:  Fair  Psychomotor Activity:  UTA  Concentration:  Concentration: Fair and Attention Span: Fair  Recall:  [employer] of Knowledge: Fair  Language: Fair  Akathisia:  No  Handed:  Right  AIMS (if indicated): UTA  Assets:  Communication Skills Desire for Improvement Housing Transportation  ADL's:  Intact  Cognition: WNL  Sleep:  Fair   Screenings: GAD-7   Flowsheet Row Video Visit from 02/15/2020 in [facility] Office Visit from 07/26/2019 in [facility] Office Visit from 09/21/2015 in [facility]  Total GAD-7 Score _0 Mini-Mental   [facility] Office Visit from 10/30/2017 in [facility]  Total Score (max 30 points ) 27    PHQ2-9  Flowsheet Row Video Visit from 02/15/2020 in [facility] Office Visit from 07/26/2019 in [facility] Office Visit from 12/21/2018 in [facility] Office Visit from 08/18/2017 in [facility] Office Visit from 11/15/2015 in [facility]  PHQ-2 Total Score 1 5 0 0 0  PHQ-9 Total Score 3 19 -- -- --    Flowsheet Row ED from 06/14/2020 in [facility] Video Visit from 05/09/2020 in [facility] No Risk No Risk       Assessment and Plan: FIZA NATION is a 55 year old Caucasian female, widowed, has a history of PTSD, depression, anxiety, multiple medical problems including coronary artery disease, prolonged QT per history, PVC, gastric bypass, vitamin B12, vitamin D deficiency, chronic pain on  methadone was evaluated by telemedicine today.  Patient does have psychosocial stressors of recently relocating to a new home.  Patient however is coping okay.  She will benefit from the following plan.  Plan PTSD-stable Continue CBT  MDD-stable Trintellix 10 mg p.o. daily  GAD-improving Trintellix 10 mg p.o. daily Continue CBT with Cheyenne Gray  Insomnia-improving Will continue to work on sleep hygiene techniques  Caffeine use disorder-improving Will monitor closely   Patient to follow-up with her cardiologist.  We will coordinate care.  Follow-up in clinic in person in 6 to 7 weeks.   I have spent at least 19 minutes non face to face with patient today which includes the time spent for preparing to see the patient  (e.g., review of test, records), ordering medications and test, psychoeducation and supportive psychotherapy, care coordination as well as documenting clinical information in electronic health record.    This note was generated in part or whole with voice recognition software. Voice recognition is usually quite accurate but there are transcription errors that can and very often do occur. I apologize for any typographical errors that were not detected and corrected.         Ursula Alert, MD 06/27/2020, 3:51 PM

## 2020-07-17 ENCOUNTER — Encounter: Payer: Self-pay | Admitting: Cardiology

## 2020-07-17 ENCOUNTER — Ambulatory Visit (INDEPENDENT_AMBULATORY_CARE_PROVIDER_SITE_OTHER): Payer: Medicare Other | Admitting: Cardiology

## 2020-07-17 ENCOUNTER — Other Ambulatory Visit: Payer: Self-pay

## 2020-07-17 VITALS — BP 106/64 | HR 51 | Ht 66.0 in | Wt 240.6 lb

## 2020-07-17 DIAGNOSIS — I472 Ventricular tachycardia: Secondary | ICD-10-CM | POA: Diagnosis not present

## 2020-07-17 DIAGNOSIS — I4729 Other ventricular tachycardia: Secondary | ICD-10-CM

## 2020-07-17 DIAGNOSIS — R55 Syncope and collapse: Secondary | ICD-10-CM

## 2020-07-17 DIAGNOSIS — I422 Other hypertrophic cardiomyopathy: Secondary | ICD-10-CM

## 2020-07-17 NOTE — Progress Notes (Signed)
Electrophysiology Office Follow up Visit Note:    Date:  07/17/2020   ID:  Cheyenne Gray, DOB 1965-07-01, MRN [id number]  PCP:  McLean-Scocuzza, Nino Glow, MD  [facility] Cardiologist:  Ida Rogue, MD  [facility] Electrophysiologist:  Vickie Epley, MD    Interval History:    Cheyenne Gray is a 55 y.o. female who presents for a follow up visit.  I last saw the patient April 18, 2020 for her hypertrophic cardiomyopathy.  She also has a history of vasovagal syncope.  These episodes are always preceded by feelings of being flushed and warm.  She denies a history of arrhythmic sounding/abrupt syncope.  Patient tells me she has been doing well since I last saw her.  Today we reviewed the results of her cardiac MRI.   Past Medical History:  Diagnosis Date  . (HFpEF) heart failure with preserved ejection fraction ([place])    a. Echo 2014: EF 65-70%, nl WM, mildly dilated LA, PASP nl; b. 12/2014 Echo: EF 65-70%, no rwma, mod septal hypertrophy w/o LVOT gradient or SAM; c. 07/2017 Echo: EF 55-60%, no rwma, mildly dil RV w/ nl syst fxn. Mildly dil RA. Dilated IVC w/ elevated CVP. Triv post effusion.  Marland Kitchen Anxiety   . Arthritis   . Asthma   . B12 deficiency   . Chronic back pain   . Chronic headaches   . Chronic pain    a. on methadone  . Concussion    hx of 4  . Coronary artery disease, non-occlusive    a. LHC 1/18: proximal to mid LAD 40% stenosed, mid LAD 30% stenosed, mid RCA 20% stenosed, distal RCA 20% stenosed, EF 55-65%, LVEDP normal  . Depression   . DJD (degenerative joint disease), multiple sites   . Frequent headaches   . Gallstone   . H/O non-ST elevation myocardial infarction (NSTEMI)   . Hand, foot and mouth disease 2016  . History of shingles   . Hypertension   . Iron deficiency anemia   . Long QT interval   . Methadone use    managed by Dr. Primus Bravo  . Migraine    1x/mo  . Motion sickness    cars  . Obesity   . Palpitations    a. 24 hour Holter:  NSR, sinus brady down to 48, occasional PVCs & couplets, 8 beats NSVT; b. 30 day event monitor 2015: NSR with rare PVC.  Marland Kitchen Psoriasis   . Syncope and collapse   . Vitamin D deficiency   . Wears dentures    full upper and lower    Past Surgical History:  Procedure Laterality Date  . ABDOMINOPLASTY     tummy tuck ? year   . [place] SURGERY  2001  . CARDIAC CATHETERIZATION Left 04/16/2016   Procedure: Left Heart Cath and Coronary Angiography;  Surgeon: [name], MD;  Location: [facility];  Service: Cardiovascular;  Laterality: Left;  . CHOLECYSTECTOMY  2001  . COLONOSCOPY WITH PROPOFOL N/A 09/28/2018   Procedure: COLONOSCOPY WITH PROPOFOL;  Surgeon: Virgel Manifold, MD;  Location: [facility];  Service: Endoscopy;  Laterality: N/A;  . ESOPHAGOGASTRODUODENOSCOPY (EGD) WITH PROPOFOL N/A 09/28/2018   Procedure: ESOPHAGOGASTRODUODENOSCOPY (EGD) WITH BIOPSY;  Surgeon: Virgel Manifold, MD;  Location: [facility];  Service: Endoscopy;  Laterality: N/A;  . GALLBLADDER SURGERY    . GASTRIC BYPASS  2001  . GASTROPLASTY      Current Medications: Current Meds  Medication Sig  .  albuterol (VENTOLIN HFA) 108 (90 Base) MCG/ACT inhaler Inhale 1-2 puffs into the lungs as needed for wheezing.  . baclofen (LIORESAL) 10 MG tablet Take 1 tablet by mouth 3 (three) times daily as needed.  . Biotin 10 MG CAPS Take by mouth.  . carvedilol (COREG) 6.25 MG tablet Take 1 tablet (6.25 mg total) by mouth 2 (two) times daily.  . Cholecalciferol (VITAMIN D3 PO) Take by mouth.  . clotrimazole (CLOTRIMAZOLE AF) 1 % cream Apply 1 application topically 2 (two) times daily. Inner thigh  . diclofenac sodium (VOLTAREN) 1 % GEL Apply 4 g topically 4 (four) times daily. Prn knees and 2 grams qid prn hands arthritis  . EPINEPHrine 0.3 mg/0.3 mL IJ SOAJ injection Inject 0.3 mLs (0.3 mg total) into the muscle as needed for anaphylaxis.  . furosemide (LASIX) 20 MG tablet TAKE 1 TABLET  BY MOUTH EVERY DAY  . gabapentin (NEURONTIN) 400 MG capsule LIMIT 2 TABLETS IN THE A.M. AND MIDDAY AND 3 TABLETS EACH EVENING  . isosorbide mononitrate (IMDUR) 60 MG 24 hr tablet Take 1 tablet (60 mg total) by mouth daily.  . Magnesium Oxide 400 MG CAPS Take 1 capsule (400 mg total) by mouth daily.  . methadone (DOLOPHINE) 10 MG tablet Limit 1-2 tablets by mouth 2-3 times per day if tolerated  . nitroGLYCERIN (NITROSTAT) 0.4 MG SL tablet Place 1 tablet (0.4 mg total) under the tongue every 5 (five) minutes as needed for chest pain.  . potassium chloride (KLOR-CON) 10 MEQ tablet Take 10 mEq by mouth as needed. Only take when using Lasix  . rosuvastatin (CRESTOR) 10 MG tablet TAKE 1 TABLET BY MOUTH EVERY DAY  . tiZANidine (ZANAFLEX) 4 MG tablet TAKE 1 TABLET BY MOUTH AT BEDTIME AS NEEDED FOR MUSCLE SPASMS.  Marland Kitchen triamcinolone cream (KENALOG) 0.1 % Apply 1 application topically 2 (two) times daily.  . vitamin B-12 (CYANOCOBALAMIN) 500 MCG tablet Take by mouth.  . vortioxetine HBr (TRINTELLIX) 10 MG TABS tablet Take 1 tablet (10 mg total) by mouth daily.     Allergies:   Covid-19 (mrna) vaccine (pfizer) [covid-19 (mrna) vaccine], Penicillins, and Bee venom   Social History   Socioeconomic History  . Marital status: Widowed    Spouse name: Not on file  . Number of children: 1  . Years of education: assoc degree  . Highest education level: Associate degree: occupational, Hotel manager, or vocational program  Occupational History  . Not on file  Tobacco Use  . Smoking status: Never Smoker  . Smokeless tobacco: Never Used  Vaping Use  . Vaping Use: Never used  Substance and Sexual Activity  . Alcohol use: No    Alcohol/week: 0.0 standard drinks    Comment: holidays  . Drug use: No  . Sexual activity: Not Currently  Other Topics Concern  . 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    Never smoker    No guns   Wears seat belt    No caffeine   Social Determinants of Radio broadcast assistant Strain: Not on file  Food Insecurity: Not on file  Transportation Needs: Not on file  Physical Activity: Not on file  Stress: Not on file  Social Connections: Not on file     Family History: The patient's  family history includes Arthritis in her brother; Breast cancer in her maternal aunt; Cancer in her maternal aunt, maternal aunt, maternal grandmother, maternal uncle, maternal uncle, mother, and paternal grandmother; Depression in her brother; Diabetes in her brother and paternal grandmother; Early death in her father and mother; Heart attack in her brother, maternal grandmother, and mother; Heart attack (age of onset: 17) in her father; Heart disease in her brother, father, and maternal grandmother; Lung cancer in her maternal grandmother; Pancreatic cancer in her maternal grandmother.  ROS:   Please see the history of present illness.    All other systems reviewed and are negative.  EKGs/Labs/Other Studies Reviewed:    The following studies were reviewed today:  June 12, 2020 cardiac MRI IMPRESSION: 1. Severe asymmetric septal hypertrophy, with basal septum measuring upto 1.6 cm. 2. Non-ischemic pattern of late gadolinium enhancement involving the LV mid basal septal wall (areas of maximum hypertrophy) and right ventricular insertion points of the LV walls. Scar/LGE comprises 7.3% of total LV myocardium. 3. There is no evidence of left ventricular outflow tract obstruction or systolic anterior motion of mitral valve. 4. There is normal left and right ventricular size and function. LVEF 51% 5. Findings are consistent with Hypertrophic Cardiomyopathy (HCM), asymmetric septal variant.       EKG:  The ekg ordered today demonstrates sinus bradycardia with a ventricular rate of 51 bpm.  Recent  Labs: 12/07/2019: ALT 21; TSH 3.915 02/02/2020: Magnesium 1.6 06/14/2020: BUN 9; Creatinine, Ser 0.79; Hemoglobin 14.0; Platelets 235; Potassium 3.0; Sodium 141  Recent Lipid Panel    Component Value Date/Time   CHOL 114 12/07/2019 1106   CHOL 114 10/02/2016 1037   CHOL 147 05/04/2013 1556   TRIG 63 12/07/2019 1106   TRIG 79 05/04/2013 1556   HDL 54 12/07/2019 1106   HDL 55 10/02/2016 1037   HDL 58 05/04/2013 1556   CHOLHDL 2.1 12/07/2019 1106   VLDL 13 12/07/2019 1106   VLDL 16 05/04/2013 1556   LDLCALC 47 12/07/2019 1106   LDLCALC 47 10/02/2016 1037   LDLCALC 73 05/04/2013 1556    Physical Exam:    VS:  BP 106/64   Pulse (!) 51   Ht _0  (1.676 m)   Wt 240 lb 9.6 oz (109.1 kg)   LMP 02/18/2016 (Approximate) Comment: neg HCG-03/26/2016  SpO2 95%   BMI 38.83 kg/m     Wt Readings from Last 3 Encounters:  07/17/20 240 lb 9.6 oz (109.1 kg)  06/14/20 240 lb (108.9 kg)  04/18/20 235 lb (106.6 kg)     GEN:  Well nourished, well developed in no acute distress.  Obese HEENT: Normal NECK: No JVD; No carotid bruits LYMPHATICS: No lymphadenopathy CARDIAC: Regular rhythm.  Bradycardic.  no murmurs, rubs, gallops RESPIRATORY:  Clear to auscultation without rales, wheezing or rhonchi  ABDOMEN: Soft, non-tender, non-distended MUSCULOSKELETAL:  No edema; No deformity  SKIN: Warm and dry NEUROLOGIC:  Alert and oriented x 3 PSYCHIATRIC:  Normal affect   ASSESSMENT:    1. Hypertrophic cardiomyopathy ([place])   2. NSVT (nonsustained ventricular tachycardia) ([place])   3. Syncope and collapse    PLAN:    In order of problems listed above:  1. 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  She does not have arrhythmic syncope.  She does not have a family history of sudden cardiac death attributable to hypertrophic cardiomyopathy.  She has had episodes of NSVT lasting up to 10 beats.  Thankfully she has  a normal left ventricular function and low burden  of LGE on her cardiac MRI.  When to use the American Heart Association HCM sudden cardiac death calculator, her risk of sudden cardiac death at 5 years is approximately 4%.  Based on her risk factors she has a class IIb indication for ICD.  We discussed what that meant and if she felt strongly we could proceed with ICD but otherwise it would be appropriate to continue watchful waiting.   for now she is doing well with her beta-blocker.  After extensive discussion about her specific risk factors, we decided to continue close monitoring with annual echo and heart rhythm monitors.  I will plan to see her back around December 2022 with an echocardiogram and 7-day ZIO monitor at that time.  I spent a lot of time during today's visit discussing red flag symptoms including syncope, lightheadedness with exertion.  She is to immediately report to the emergency department if she has abrupt syncope.  She should continue taking her carvedilol.  Medication Adjustments/Labs and Tests Ordered: Current medicines are reviewed at length with the patient today.  Concerns regarding medicines are outlined above.  No orders of the defined types were placed in this encounter.  No orders of the defined types were placed in this encounter.    Signed, Lars Mage, MD, [facility], [facility] 07/17/2020 3:02 PM    Electrophysiology [facility]

## 2020-07-17 NOTE — Patient Instructions (Addendum)
Medication Instructions:  Your physician recommends that you continue on your current medications as directed. Please refer to the Current Medication list given to you today. *If you need a refill on your cardiac medications before your next appointment, please call your pharmacy*  Lab Work: None ordered. If you have labs (blood work) drawn today and your tests are completely normal, you will receive your results only by: Marland Kitchen MyChart Message (if you have MyChart) OR . A paper copy in the mail If you have any lab test that is abnormal or we need to change your treatment, we will call you to review the results.  Testing/Procedures: None ordered.  Follow-Up: At [facility], you and your health needs are our priority.  As part of our continuing mission to provide you with exceptional heart care, we have created designated Provider Care Teams.  These Care Teams include your primary Cardiologist (physician) and Advanced Practice Providers (APPs -  Physician Assistants and Nurse Practitioners) who all work together to provide you with the care you need, when you need it.  Your next appointment:   Your physician wants you to follow-up in: 7 months with Dr. Lalla Brothers.   You will receive a reminder letter in the mail two months in advance. If you don't receive a letter, please call our office to schedule the follow-up appointment.

## 2020-07-18 ENCOUNTER — Other Ambulatory Visit: Payer: Self-pay | Admitting: Internal Medicine

## 2020-07-18 ENCOUNTER — Other Ambulatory Visit: Payer: Self-pay | Admitting: Psychiatry

## 2020-07-18 DIAGNOSIS — F4310 Post-traumatic stress disorder, unspecified: Secondary | ICD-10-CM

## 2020-07-18 DIAGNOSIS — F411 Generalized anxiety disorder: Secondary | ICD-10-CM

## 2020-07-18 DIAGNOSIS — M1990 Unspecified osteoarthritis, unspecified site: Secondary | ICD-10-CM

## 2020-07-19 ENCOUNTER — Other Ambulatory Visit: Payer: Self-pay | Admitting: Cardiovascular Disease

## 2020-07-19 NOTE — Telephone Encounter (Signed)
Received fax from [facility] requesting refills for Isosorbide 60 mg QD. Pt is overdue for follow-up appointment with Dr. Mariah Milling. Please call pt to schedule. Thank you!

## 2020-07-23 MED ORDER — ISOSORBIDE MONONITRATE ER 60 MG PO TB24: 60.0000 mg | ORAL_TABLET | Freq: Every day | ORAL | 0 refills | Status: DC

## 2020-07-23 NOTE — Telephone Encounter (Signed)
Pt scheduled for 08/10/20 at 10 AM with Marisue Ivan, PA.

## 2020-08-06 ENCOUNTER — Telehealth: Payer: Self-pay | Admitting: Internal Medicine

## 2020-08-06 NOTE — Telephone Encounter (Signed)
I spoke to patient and she will call back and schedule Medicare Annual Wellness Visit (AWV) in office.   If not able to come in office, please offer to do virtually or by telephone.   Due for AWVI  Please schedule at anytime with Nurse Health Advisor.   

## 2020-08-10 ENCOUNTER — Ambulatory Visit: Payer: Medicare Other | Admitting: Physician Assistant

## 2020-08-14 ENCOUNTER — Other Ambulatory Visit: Payer: Self-pay

## 2020-08-14 ENCOUNTER — Encounter: Payer: Self-pay | Admitting: Psychiatry

## 2020-08-14 ENCOUNTER — Telehealth (INDEPENDENT_AMBULATORY_CARE_PROVIDER_SITE_OTHER): Payer: Medicare Other | Admitting: Psychiatry

## 2020-08-14 DIAGNOSIS — G4701 Insomnia due to medical condition: Secondary | ICD-10-CM

## 2020-08-14 DIAGNOSIS — F3341 Major depressive disorder, recurrent, in partial remission: Secondary | ICD-10-CM | POA: Diagnosis not present

## 2020-08-14 DIAGNOSIS — F411 Generalized anxiety disorder: Secondary | ICD-10-CM | POA: Diagnosis not present

## 2020-08-14 DIAGNOSIS — F4310 Post-traumatic stress disorder, unspecified: Secondary | ICD-10-CM

## 2020-08-14 DIAGNOSIS — F1590 Other stimulant use, unspecified, uncomplicated: Secondary | ICD-10-CM

## 2020-08-14 MED ORDER — VORTIOXETINE HBR 10 MG PO TABS: 10.0000 mg | ORAL_TABLET | Freq: Every day | ORAL | 1 refills | Status: DC

## 2020-08-14 NOTE — Progress Notes (Signed)
Virtual Visit via Telephone Note  I connected with Cheyenne Gray on 08/14/20 at  3:30 PM EDT by telephone and verified that I am speaking with the correct person using two identifiers.  Location Provider Location : [facility] Patient Location : Home  Participants: Patient , Provider   I discussed the limitations, risks, security and privacy concerns of performing an evaluation and management service by telephone and the availability of in person appointments. I also discussed with the patient that there may be a patient responsible charge related to this service. The patient expressed understanding and agreed to proceed.   I discussed the assessment and treatment plan with the patient. The patient was provided an opportunity to ask questions and all were answered. The patient agreed with the plan and demonstrated an understanding of the instructions.   The patient was advised to call back or seek an in-person evaluation if the symptoms worsen or if the condition fails to improve as anticipated.   [place] MD OP Progress Note  08/14/2020 3:59 PM Cheyenne Gray  MRN:  [id number]  Chief Complaint:  Chief Complaint    Follow-up; Anxiety; Depression     HPI: Cheyenne Gray is a 55 year old Caucasian female, widowed, on disability, has a history of PTSD, MDD, GAD, insomnia, caffeine use disorder, history of prolonged QT syndrome, chronic back pain, vitamin B12 deficiency, heart failure, iron deficiency, vitamin D deficiency, history of gastric bypass, MI, hypertension was evaluated by telemedicine today.  Patient today reports she is currently at her new apartment in [place].  She reports she is happy that she was able to move into this place.  She however wants to give it more time.  She has made a few friends.  She however continues to not leave her house, unsure why.  She was not able to elaborate.  She has reported in the past that she does not feel safe in her community and that is why she does  not leave.  She reports this community that she moved into is not very secure.  Patient is currently not in therapy however agrees to establish care with a new therapist.  She denies any significant depression or anxiety.  She is compliant on the Trintellix.  She does report a headache from the Trintellix however she is coping with it and it is manageable.  She does not want any dose change or medication change.  She reports sleep is overall okay.  Patient denies any suicidality, homicidality or perceptual disturbances.  Visit Diagnosis:    ICD-10-CM   1. PTSD (post-traumatic stress disorder)  F43.10   2. GAD (generalized anxiety disorder)  F41.1   3. MDD (major depressive disorder), recurrent, in partial remission (HCC)  F33.41 vortioxetine HBr (TRINTELLIX) 10 MG TABS tablet   Improving  4. Insomnia due to medical condition  G47.01    pain, mood  5. 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  Past trials of Paxil, Cymbalta, Valium, melatonin, trazodone, Ambien, Wellbutrin, Abilify  Past Medical History:  Past Medical History:  Diagnosis Date  . (HFpEF) heart failure with preserved ejection fraction ([place])    a. Echo 2014: EF 65-70%, nl WM, mildly dilated LA, PASP nl; b. 12/2014 Echo: EF 65-70%, no rwma, mod septal hypertrophy w/o LVOT gradient or SAM; c. 07/2017 Echo: EF 55-60%, no rwma, mildly dil RV w/ nl syst fxn. Mildly dil RA. Dilated IVC  w/ elevated CVP. Triv post effusion.  Marland Kitchen Anxiety   . Arthritis   . Asthma   . B12 deficiency   . Chronic back pain   . Chronic headaches   . Chronic pain    a. on methadone  . Concussion    hx of 4  . Coronary artery disease, non-occlusive    a. LHC 1/18: proximal to mid LAD 40% stenosed, mid LAD 30% stenosed, mid RCA 20% stenosed, distal RCA 20% stenosed, EF 55-65%, LVEDP normal  . Depression   . DJD (degenerative joint disease), multiple sites   .  Frequent headaches   . Gallstone   . H/O non-ST elevation myocardial infarction (NSTEMI)   . Hand, foot and mouth disease 2016  . History of shingles   . Hypertension   . Iron deficiency anemia   . Long QT interval   . Methadone use    managed by Dr. Primus Bravo  . Migraine    1x/mo  . Motion sickness    cars  . Obesity   . Palpitations    a. 24 hour Holter: NSR, sinus brady down to 48, occasional PVCs & couplets, 8 beats NSVT; b. 30 day event monitor 2015: NSR with rare PVC.  Marland Kitchen Psoriasis   . Syncope and collapse   . Vitamin D deficiency   . Wears dentures    full upper and lower    Past Surgical History:  Procedure Laterality Date  . ABDOMINOPLASTY     tummy tuck ? year   . [place] SURGERY  2001  . CARDIAC CATHETERIZATION Left 04/16/2016   Procedure: Left Heart Cath and Coronary Angiography;  Surgeon: [name], MD;  Location: [facility];  Service: Cardiovascular;  Laterality: Left;  . CHOLECYSTECTOMY  2001  . COLONOSCOPY WITH PROPOFOL N/A 09/28/2018   Procedure: COLONOSCOPY WITH PROPOFOL;  Surgeon: Virgel Manifold, MD;  Location: [facility];  Service: Endoscopy;  Laterality: N/A;  . ESOPHAGOGASTRODUODENOSCOPY (EGD) WITH PROPOFOL N/A 09/28/2018   Procedure: ESOPHAGOGASTRODUODENOSCOPY (EGD) WITH BIOPSY;  Surgeon: Virgel Manifold, MD;  Location: [facility];  Service: Endoscopy;  Laterality: N/A;  . GALLBLADDER SURGERY    . GASTRIC BYPASS  2001  . GASTROPLASTY      Family Psychiatric History: I have reviewed family psychiatric history from progress note on 10/17/2017.  Family History:  Family History  Problem Relation Age of Onset  . Heart attack Mother   . Cancer Mother        pancreatitic 44  . Early death Mother   . Heart attack Father 37       MI  . Early death Father   . Heart disease Father   . Heart attack Brother   . Heart disease Brother   . Arthritis Brother   . Depression Brother   . Diabetes Brother   . Heart  attack Maternal Grandmother   . Cancer Maternal Grandmother        pancreatitic   . Heart disease Maternal Grandmother   . Lung cancer Maternal Grandmother   . Pancreatic cancer Maternal Grandmother   . Cancer Maternal Uncle        pancreatitic   . Cancer Paternal Grandmother        ? type   . Diabetes Paternal Grandmother   . Cancer Maternal Uncle        pancreatitic   . Breast cancer Maternal Aunt   . Cancer Maternal Aunt  GYN  . Cancer Maternal Aunt        GYN    Social History: I have reviewed social history from progress note on 10/17/2017. Social History   Socioeconomic History  . Marital status: Widowed    Spouse name: Not on file  . Number of children: 1  . Years of education: assoc degree  . Highest education level: Associate degree: occupational, Hotel manager, or vocational program  Occupational History  . Not on file  Tobacco Use  . Smoking status: Never Smoker  . Smokeless tobacco: Never Used  Vaping Use  . Vaping Use: Never used  Substance and Sexual Activity  . Alcohol use: No    Alcohol/week: 0.0 standard drinks    Comment: holidays  . Drug use: No  . Sexual activity: Not Currently  Other Topics Concern  . 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 Covid-19 (Mrna) Vaccine Therapist, music) [Covid-19 (Mrna) Vaccine] Itching, Palpitations and Other (See Comments)    Throat closing.   Marland Kitchen Penicillins Hives, Shortness Of Breath and Rash    Has patient had  a PCN reaction causing immediate rash, facial/tongue/throat swelling, SOB or lightheadedness with hypotension: Yes Has patient had a PCN reaction causing severe rash involving mucus membranes or skin necrosis: No Has patient had a PCN reaction that required hospitalization No Has patient had a PCN reaction occurring within the last 10 years: No If all of the above answers are "NO", then may proceed with Cephalosporin use.   Raelyn Ensign Venom     Metabolic Disorder Labs: Lab Results  Component Value Date   HGBA1C 5.3 09/01/2018   No results found for: PROLACTIN Lab Results  Component Value Date   CHOL 114 12/07/2019   TRIG 63 12/07/2019   HDL 54 12/07/2019   CHOLHDL 2.1 12/07/2019   VLDL 13 12/07/2019   LDLCALC 47 12/07/2019   LDLCALC 95 09/01/2018   Lab Results  Component Value Date   TSH 3.915 12/07/2019   TSH 3.250 11/09/2019    Therapeutic Level Labs: No results found for: LITHIUM No results found for: VALPROATE No components found for:  CBMZ  Current Medications: Current Outpatient Medications  Medication Sig Dispense Refill  . albuterol (VENTOLIN HFA) 108 (90 Base) MCG/ACT inhaler Inhale 1-2 puffs into the lungs as needed for wheezing. 18 g 11  . baclofen (LIORESAL) 10 MG tablet Take 1 tablet by mouth 3 (three) times daily as needed.    . Biotin 10 MG CAPS Take by mouth.    . carvedilol (COREG) 6.25 MG tablet Take 1 tablet (6.25 mg total) by mouth 2 (two) times daily. 60 tablet 5  . Cholecalciferol (VITAMIN D3 PO) Take by mouth.    . clotrimazole (CLOTRIMAZOLE AF) 1 % cream Apply 1 application topically 2 (two) times daily. Inner thigh 60 g 12  . diclofenac sodium (VOLTAREN) 1 %  GEL Apply 4 g topically 4 (four) times daily. Prn knees and 2 grams qid prn hands arthritis 100 g 11  . EPINEPHrine 0.3 mg/0.3 mL IJ SOAJ injection Inject 0.3 mLs (0.3 mg total) into the muscle as needed for anaphylaxis. 2 each 0  . furosemide (LASIX) 20 MG tablet TAKE 1 TABLET BY MOUTH EVERY DAY  90 tablet 1  . gabapentin (NEURONTIN) 400 MG capsule LIMIT 2 TABLETS IN THE A.M. AND MIDDAY AND 3 TABLETS EACH EVENING 630 capsule 3  . isosorbide mononitrate (IMDUR) 60 MG 24 hr tablet Take 1 tablet (60 mg total) by mouth daily. Please keep your upcoming appointment for refills. 30 tablet 0  . Magnesium Oxide 400 MG CAPS Take 1 capsule (400 mg total) by mouth daily. 30 capsule 6  . methadone (DOLOPHINE) 10 MG tablet Limit 1-2 tablets by mouth 2-3 times per day if tolerated 180 tablet 0  . nitroGLYCERIN (NITROSTAT) 0.4 MG SL tablet Place 1 tablet (0.4 mg total) under the tongue every 5 (five) minutes as needed for chest pain. 25 tablet 3  . potassium chloride (KLOR-CON) 10 MEQ tablet Take 10 mEq by mouth as needed. Only take when using Lasix    . rOPINIRole (REQUIP) 0.25 MG tablet  (Patient not taking: Reported on 08/14/2020)    . rosuvastatin (CRESTOR) 10 MG tablet TAKE 1 TABLET BY MOUTH EVERY DAY 90 tablet 3  . tiZANidine (ZANAFLEX) 4 MG tablet TAKE 1 TABLET BY MOUTH AT BEDTIME AS NEEDED FOR MUSCLE SPASMS. 90 tablet 1  . triamcinolone cream (KENALOG) 0.1 % Apply 1 application topically 2 (two) times daily. 80 g 0  . vitamin B-12 (CYANOCOBALAMIN) 500 MCG tablet Take by mouth.    . vortioxetine HBr (TRINTELLIX) 10 MG TABS tablet Take 1 tablet (10 mg total) by mouth daily. 30 tablet 1   No current facility-administered medications for this visit.     Musculoskeletal: Strength & Muscle Tone: UTA Gait & Station: UTA Patient leans: N/A  Psychiatric Specialty Exam: Review of Systems  Musculoskeletal: Positive for back pain.  Psychiatric/Behavioral: The patient is nervous/anxious.   All other systems reviewed and are negative.   Last menstrual period 02/18/2016.There is no height or weight on file to calculate BMI.  General Appearance: UTA  Eye Contact:  UTA  Speech:  Clear and Coherent  Volume:  Normal  Mood:  Anxious  Affect:  UTA  Thought Process:  Goal Directed and Descriptions of  Associations: Intact  Orientation:  Full (Time, Place, and Person)  Thought Content: Logical   Suicidal Thoughts:  No  Homicidal Thoughts:  No  Memory:  Immediate;   Fair Recent;   Fair Remote;   Fair  Judgement:  Fair  Insight:  Fair  Psychomotor Activity:  UTA  Concentration:  Concentration: Fair and Attention Span: Fair  Recall:  [employer] of Knowledge: Fair  Language: Fair  Akathisia:  No  Handed:  Right  AIMS (if indicated): UTA  Assets:  Communication Skills Desire for Improvement Housing  ADL's:  Intact  Cognition: WNL  Sleep:  Restless   Screenings: GAD-7   Flowsheet Row Video Visit from 02/15/2020 in [facility] Office Visit from 07/26/2019 in [facility] Office Visit from 09/21/2015 in [facility]  Total GAD-7 Score _0 Mini-Mental   [facility] Office Visit from 10/30/2017 in [facility]  Total Score (max 30 points ) 27  PHQ2-9   Flowsheet Row Video Visit from 08/14/2020 in [facility] Video Visit from 02/15/2020 in [facility] Office Visit from 07/26/2019 in [facility] Office Visit from 12/21/2018 in [facility] Office Visit from 08/18/2017 in [facility]  PHQ-2 Total Score _0 0 0  PHQ-9 Total Score _1 -- --    Flowsheet Row Video Visit from 08/14/2020 in [facility] ED from 06/14/2020 in [facility] Video Visit from 05/09/2020 in [facility] No Risk No Risk No Risk       Assessment and Plan: ABBE BULA is a 55 year old Caucasian female, widowed, has a history of PTSD, depression, anxiety, multiple medical problems including coronary artery disease, prolonged QT per history, PVC, gastric bypass, vitamin  B12, D deficiency, chronic pain on methadone was evaluated by telemedicine today.  Patient is currently stable.  Plan as noted below.  Plan PTSD-stable Continue CBT  MDD-stable Trintellix 10 mg p.o. daily  GAD- improving Trintellix as prescribed Patient will benefit from psychotherapy sessions, she is trying to establish care with a new therapist.  Insomnia-improving We will continue sleep hygiene techniques  Caffeine use disorder-improving Will monitor closely  Follow-up in clinic in 2 months in person.   I have spent at least 20 minutes non face to face with patient today .    This note was generated in part or whole with voice recognition software. Voice recognition is usually quite accurate but there are transcription errors that can and very often do occur. I apologize for any typographical errors that were not detected and corrected.      Ursula Alert, MD 08/15/2020, 8:46 AM

## 2020-08-17 ENCOUNTER — Telehealth: Payer: Self-pay | Admitting: Cardiovascular Disease

## 2020-08-17 NOTE — Telephone Encounter (Signed)
   [facility] Pre-operative Risk Assessment    Patient Name: Cheyenne Gray  DOB: Jan 24, 1966  MRN: [id number]   HEARTCARE STAFF: - Please ensure there is not already an duplicate clearance open for this procedure. - Under Visit Info/Reason for Call, type in Other and utilize the format Clearance MM/DD/YY or Clearance TBD. Do not use dashes or single digits. - If request is for dental extraction, please clarify the # of teeth to be extracted. - If the patient is currently at the dentist's office, call Pre-Op APP to address. If the patient is not currently in the dentist office, please route to the Pre-Op pool  Request for surgical clearance:  1. What type of surgery is being performed? EGD and Colonoscopy   2. When is this surgery scheduled? TBD  3. What type of clearance is required (medical clearance vs. Pharmacy clearance to hold med vs. Both)? both  4. Are there any medications that need to be held prior to surgery and how long? Not listed, please advise if needed   5. Practice name and name of physician performing surgery? [facility] - Dr Bonna Gains   6. What is the office phone number? [phone]   7.   What is the office fax number? [phone]  8.   Anesthesia type (None, local, MAC, general) ? Not listed    Ace Gins 08/17/2020, 2:00 PM  _________________________________________________________________   (provider comments below)

## 2020-08-17 NOTE — Telephone Encounter (Signed)
Faxed another cardiac clearance. Hopefully we get a response this time around.

## 2020-08-17 NOTE — Telephone Encounter (Signed)
    Cheyenne Gray DOB:  1965/06/03  MRN:  [id number]   Primary Cardiologist: Julien Nordmann, MD  Chart reviewed as part of pre-operative protocol coverage. Given past medical history and time since last visit, based on ACC/AHA guidelines, Cheyenne Gray would be at acceptable risk for the planned procedure without further cardiovascular testing.   The patient was advised that if she develops new symptoms prior to surgery to contact our office to arrange for a follow-up visit, and she verbalized understanding.  She is not on anticoagulation nor antiplatelet agent that would need to be discontinued. She was last seen 07/17/20 by Dr. Lalla Brothers doing overall well from a cardiac perspective.   I will route this recommendation to the requesting party via Epic fax function and remove from pre-op pool.  Please call with questions.  Alver Sorrow, NP 08/17/2020, 2:27 PM

## 2020-08-17 NOTE — Telephone Encounter (Signed)
-----   Message from Caitlin S Walker, NP sent at 08/17/2020  2:29 PM EDT ----- She is due for general cardiology follow up with Dr. Gollan or APP, please facilitate to schedule. Thank you!  

## 2020-08-17 NOTE — Telephone Encounter (Signed)
Left VM to schedule 

## 2020-09-03 ENCOUNTER — Telehealth: Payer: Self-pay | Admitting: Oncology

## 2020-09-03 ENCOUNTER — Inpatient Hospital Stay: Payer: Medicare Other | Admitting: Oncology

## 2020-09-03 ENCOUNTER — Inpatient Hospital Stay: Payer: Medicare Other | Attending: Oncology

## 2020-09-03 NOTE — Telephone Encounter (Signed)
Pt left vm requesting to reschedule her 6/20 due to a fall over the weekend.  Tried to return her call but was unable to leave a vm due to her mailbox being full.  Sent MyChart message with rescheduling availability.

## 2020-09-04 NOTE — Telephone Encounter (Signed)
Attempted to schedule no ans no vm  

## 2020-09-04 NOTE — Telephone Encounter (Signed)
-----   Message from Alver Sorrow, NP sent at 08/17/2020  2:29 PM EDT ----- She is due for general cardiology follow up with Dr. Mariah Milling or APP, please facilitate to schedule. Thank you!

## 2020-09-11 ENCOUNTER — Other Ambulatory Visit: Payer: Self-pay | Admitting: Psychiatry

## 2020-09-11 ENCOUNTER — Other Ambulatory Visit: Payer: Self-pay | Admitting: Internal Medicine

## 2020-09-11 DIAGNOSIS — M1990 Unspecified osteoarthritis, unspecified site: Secondary | ICD-10-CM

## 2020-09-11 DIAGNOSIS — F4310 Post-traumatic stress disorder, unspecified: Secondary | ICD-10-CM

## 2020-09-11 DIAGNOSIS — F411 Generalized anxiety disorder: Secondary | ICD-10-CM

## 2020-09-11 DIAGNOSIS — F331 Major depressive disorder, recurrent, moderate: Secondary | ICD-10-CM

## 2020-09-18 NOTE — Telephone Encounter (Signed)
Attempted to schedule.  LMOV to call office.  ° °

## 2020-10-14 ENCOUNTER — Other Ambulatory Visit: Payer: Self-pay | Admitting: Cardiovascular Disease

## 2020-10-22 ENCOUNTER — Encounter: Payer: Self-pay | Admitting: Psychiatry

## 2020-10-22 ENCOUNTER — Other Ambulatory Visit: Payer: Self-pay

## 2020-10-22 ENCOUNTER — Ambulatory Visit (INDEPENDENT_AMBULATORY_CARE_PROVIDER_SITE_OTHER): Payer: Medicare Other | Admitting: Psychiatry

## 2020-10-22 VITALS — BP 127/83 | HR 52 | Temp 97.9°F | Wt 240.0 lb

## 2020-10-22 DIAGNOSIS — F411 Generalized anxiety disorder: Secondary | ICD-10-CM

## 2020-10-22 DIAGNOSIS — I200 Unstable angina: Secondary | ICD-10-CM

## 2020-10-22 DIAGNOSIS — R519 Headache, unspecified: Secondary | ICD-10-CM

## 2020-10-22 DIAGNOSIS — F3342 Major depressive disorder, recurrent, in full remission: Secondary | ICD-10-CM | POA: Diagnosis not present

## 2020-10-22 DIAGNOSIS — G4701 Insomnia due to medical condition: Secondary | ICD-10-CM | POA: Diagnosis not present

## 2020-10-22 DIAGNOSIS — F4310 Post-traumatic stress disorder, unspecified: Secondary | ICD-10-CM

## 2020-10-22 DIAGNOSIS — Z9189 Other specified personal risk factors, not elsewhere classified: Secondary | ICD-10-CM

## 2020-10-22 MED ORDER — MIRTAZAPINE 15 MG PO TABS: 15.0000 mg | ORAL_TABLET | Freq: Every day | ORAL | 1 refills | Status: DC

## 2020-10-22 NOTE — Progress Notes (Signed)
[facility] MD OP Progress Note  10/22/2020 1:24 PM CALEN POSCH  MRN:  [id number]  Chief Complaint:  Chief Complaint   Follow-up    HPI: Cheyenne Gray is a 55 year old Caucasian female, widowed, on disability, has a history of PTSD, MDD, GAD, insomnia, caffeine use disorder, history of prolonged QT syndrome, chronic back pain, vitamin B12 deficiency, heart failure, iron deficiency, vitamin D deficiency, history of gastric bypass, MI, hypertension was evaluated in office today.  Patient today reports she continues to struggle with sleep.  She sleeps anywhere between 2 hours to 4 hours depending upon the night.  She reports she takes naps during the day at times especially since it is very hot outside right now.  Patient reports she is not using any sleep medications except for a sleep aid she was prescribed long time ago which she uses as needed.  She does not remember the name.  She also did not bring the sleep medication today to the visit.  Patient continues to struggle with back pain, shoulder and lower extremity-right-sided pain.  She reports the pain also does have an impact on her sleep.  She reports she has been waking up with a headache usually starts in the morning around the time that she takes her morning medications.  It lasts throughout the day at times.  She does have a history of migraine headaches however this is different from the migraine headaches that she has had in the past.  She does not have any nausea, vomiting, or any aura during these episodes.  Patient denies any suicidality, homicidality or perceptual disturbances.  Patient reports she is compliant on the Trintellix.  She wonders whether Trintellix could be contributing to the headaches however she reports she wants to stay on the Trintellix for now.  Patient with bradycardia, also history of QT prolongation, reports she will make an appointment with her cardiologist.  Patient reports she has a pet dog who is sick  with seizure disorder and getting old and she wants a letter to keep the dog at home.  Patient denies any other concerns today.  Visit Diagnosis:    ICD-10-CM   1. PTSD (post-traumatic stress disorder)  F43.10     2. GAD (generalized anxiety disorder)  F41.1     3. MDD (major depressive disorder), recurrent, in full remission ([place])  F33.42     4. Insomnia due to medical condition  G47.01 mirtazapine (REMERON) 15 MG tablet   mood, pain, caffeine use ,lack of sleep hygiene    5. At risk for prolonged QT interval syndrome  Z91.89 EKG 12-Lead    6. Nonintractable headache, unspecified chronicity pattern, unspecified headache type  R51.9       Past Psychiatric History: Reviewed past psychiatric history from progress note on 10/17/2017.  Past trials of Paxil, Cymbalta, Valium, melatonin, trazodone, Ambien, Wellbutrin, Abilify  Past Medical History:  Past Medical History:  Diagnosis Date   (HFpEF) heart failure with preserved ejection fraction ([place])    a. Echo 2014: EF 65-70%, nl WM, mildly dilated LA, PASP nl; b. 12/2014 Echo: EF 65-70%, no rwma, mod septal hypertrophy w/o LVOT gradient or SAM; c. 07/2017 Echo: EF 55-60%, no rwma, mildly dil RV w/ nl syst fxn. Mildly dil RA. Dilated IVC w/ elevated CVP. Triv post effusion.   Anxiety    Arthritis    Asthma    B12 deficiency    Chronic back pain    Chronic headaches    Chronic pain  a. on methadone   Concussion    hx of 4   Coronary artery disease, non-occlusive    a. LHC 1/18: proximal to mid LAD 40% stenosed, mid LAD 30% stenosed, mid RCA 20% stenosed, distal RCA 20% stenosed, EF 55-65%, LVEDP normal   Depression    DJD (degenerative joint disease), multiple sites    Frequent headaches    Gallstone    H/O non-ST elevation myocardial infarction (NSTEMI)    Hand, foot and mouth disease 2016   History of shingles    Hypertension    Iron deficiency anemia    Long QT interval    Methadone use    managed by Dr. Primus Bravo   Migraine     1x/mo   Motion sickness    cars   Obesity    Palpitations    a. 24 hour Holter: NSR, sinus brady down to 48, occasional PVCs & couplets, 8 beats NSVT; b. 30 day event monitor 2015: NSR with rare PVC.   Psoriasis    Syncope and collapse    Vitamin D deficiency    Wears dentures    full upper and lower    Past Surgical History:  Procedure Laterality Date   ABDOMINOPLASTY     tummy tuck ? year    BARIATRIC SURGERY  2001   CARDIAC CATHETERIZATION Left 04/16/2016   Procedure: Left Heart Cath and Coronary Angiography;  Surgeon: [name], MD;  Location: [facility];  Service: Cardiovascular;  Laterality: Left;   CHOLECYSTECTOMY  2001   COLONOSCOPY WITH PROPOFOL N/A 09/28/2018   Procedure: COLONOSCOPY WITH PROPOFOL;  Surgeon: Virgel Manifold, MD;  Location: [facility];  Service: Endoscopy;  Laterality: N/A;   ESOPHAGOGASTRODUODENOSCOPY (EGD) WITH PROPOFOL N/A 09/28/2018   Procedure: ESOPHAGOGASTRODUODENOSCOPY (EGD) WITH BIOPSY;  Surgeon: Virgel Manifold, MD;  Location: [facility];  Service: Endoscopy;  Laterality: N/A;   GALLBLADDER SURGERY     GASTRIC BYPASS  2001   GASTROPLASTY      Family Psychiatric History: Reviewed family psychiatric history from progress note on 10/17/2017  Family History:  Family History  Problem Relation Age of Onset   Heart attack Mother    Cancer Mother        pancreatitic 67   Early death Mother    Heart attack Father 32       MI   Early death Father    Heart disease Father    Heart attack Brother    Heart disease Brother    Arthritis Brother    Depression Brother    Diabetes Brother    Heart attack Maternal Grandmother    Cancer Maternal Grandmother        pancreatitic    Heart disease Maternal Grandmother    Lung cancer Maternal Grandmother    Pancreatic cancer Maternal Grandmother    Cancer Maternal Uncle        pancreatitic    Cancer Paternal Grandmother        ? type    Diabetes Paternal  Grandmother    Cancer Maternal Uncle        pancreatitic    Breast cancer Maternal Aunt    Cancer Maternal Aunt        GYN   Cancer Maternal Aunt        GYN    Social History: Reviewed social history from progress note on 10/17/2017 Social History   Socioeconomic History   Marital status: Widowed    Spouse  name: Not on file   Number of children: 1   Years of education: assoc degree   Highest education level: Associate degree: occupational, Hotel manager, or vocational program  Occupational History   Not on file  Tobacco Use   Smoking status: Never   Smokeless tobacco: Never  Vaping Use   Vaping Use: Never used  Substance and Sexual Activity   Alcohol use: No    Alcohol/week: 0.0 standard drinks    Comment: holidays   Drug use: No   Sexual activity: Not Currently  Other Topics Concern   Not on file  Social History Narrative   Adopted daughter [name] [phone] (now lives in [place] going to [place] has apt there)   Significant other mac [phone], former husband died    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    Penicillins Hives, Shortness Of Breath and Rash    Has patient had a PCN reaction causing immediate rash, facial/tongue/throat swelling, SOB or lightheadedness with hypotension: Yes Has patient had a PCN reaction causing severe rash involving mucus membranes or skin necrosis: No Has patient had a PCN reaction that required hospitalization No Has patient had a PCN reaction occurring within the last 10 years: No If all of  the above answers are "NO", then may proceed with Cephalosporin use.    Bee Venom     Metabolic Disorder Labs: Lab Results  Component Value Date   HGBA1C 5.3 09/01/2018   No results found for: PROLACTIN Lab Results  Component Value Date   CHOL 114 12/07/2019   TRIG 63 12/07/2019   HDL 54 12/07/2019   CHOLHDL 2.1 12/07/2019   VLDL 13 12/07/2019   LDLCALC 47 12/07/2019   LDLCALC 95 09/01/2018   Lab Results  Component Value Date   TSH 3.915 12/07/2019   TSH 3.250 11/09/2019    Therapeutic Level Labs: No results found for: LITHIUM No results found for: VALPROATE No components found for:  CBMZ  Current Medications: Current Outpatient Medications  Medication Sig Dispense Refill   gabapentin (NEURONTIN) 400 MG capsule LIMIT 2 TABLETS IN THE A.M. AND MIDDAY AND 3 TABLETS EACH EVENING 630 capsule 3   isosorbide mononitrate (IMDUR) 60 MG 24 hr tablet Take 1 tablet (60 mg total) by mouth daily. 30 tablet 2   Magnesium Oxide 400 MG CAPS Take 1 capsule (400 mg total) by mouth daily. 30 capsule 6   methadone (DOLOPHINE) 10 MG tablet Limit 1-2 tablets by mouth 2-3 times per day if tolerated 180 tablet 0   mirtazapine (REMERON) 15 MG tablet Take 1 tablet (15 mg total) by mouth at bedtime. 30 tablet 1   rosuvastatin (CRESTOR) 10 MG tablet TAKE 1 TABLET BY MOUTH EVERY DAY 90 tablet 3   triamcinolone cream (KENALOG) 0.1 % Apply 1 application topically 2 (two) times daily. 80 g 0   vortioxetine HBr (TRINTELLIX) 10 MG TABS tablet Take 1 tablet (10 mg total) by mouth daily. 30 tablet  1   albuterol (VENTOLIN HFA) 108 (90 Base) MCG/ACT inhaler INHALE 1-2 PUFFS INTO THE LUNGS AS NEEDED FOR WHEEZING. 18 each 11   baclofen (LIORESAL) 10 MG tablet Take 1 tablet by mouth 3 (three) times daily as needed.     Biotin 10 MG CAPS Take by mouth.     carvedilol (COREG) 6.25 MG tablet Take 1 tablet (6.25 mg total) by mouth 2 (two) times daily. 60 tablet 5   diclofenac sodium (VOLTAREN) 1 % GEL Apply 4 g  topically 4 (four) times daily. Prn knees and 2 grams qid prn hands arthritis 100 g 11   EPINEPHrine 0.3 mg/0.3 mL IJ SOAJ injection Inject 0.3 mLs (0.3 mg total) into the muscle as needed for anaphylaxis. (Patient not taking: Reported on 10/22/2020) 2 each 0   furosemide (LASIX) 20 MG tablet TAKE 1 TABLET BY MOUTH EVERY DAY (Patient not taking: Reported on 10/22/2020) 90 tablet 1   nitroGLYCERIN (NITROSTAT) 0.4 MG SL tablet Place 1 tablet (0.4 mg total) under the tongue every 5 (five) minutes as needed for chest pain. (Patient not taking: Reported on 10/22/2020) 25 tablet 3   No current facility-administered medications for this visit.     Musculoskeletal: Strength & Muscle Tone: within normal limits Gait & Station: normal Patient leans: N/A  Psychiatric Specialty Exam: Review of Systems  Musculoskeletal:  Positive for back pain.       Rt.sided shoulder and LE - Pain  Neurological:  Positive for headaches.  Psychiatric/Behavioral:  Positive for sleep disturbance. The patient is nervous/anxious.   All other systems reviewed and are negative.  Blood pressure 127/83, pulse (!) 52, temperature 97.9 F (36.6 C), temperature source Temporal, weight 240 lb (108.9 kg), last menstrual period 02/18/2016.Body mass index is 38.74 kg/m.  General Appearance: Casual  Eye Contact:  Good  Speech:  Clear and Coherent  Volume:  Normal  Mood:  Anxious  Affect:  Congruent  Thought Process:  Goal Directed and Descriptions of Associations: Intact  Orientation:  Full (Time, Place, and Person)  Thought Content: Logical   Suicidal Thoughts:  No  Homicidal Thoughts:  No  Memory:  Immediate;   Fair Recent;   Fair Remote;   Fair  Judgement:  Fair  Insight:  Fair  Psychomotor Activity:  Normal  Concentration:  Concentration: Fair and Attention Span: Fair  Recall:  [employer] of Knowledge: Fair  Language: Fair  Akathisia:  No  Handed:  Right  AIMS (if indicated): done  Assets:  Communication  Skills Desire for Improvement Housing Transportation  ADL's:  Intact  Cognition: WNL  Sleep:  Poor   Screenings: GAD-7    Flowsheet Row Video Visit from 02/15/2020 in [facility] Office Visit from 07/26/2019 in [facility] Office Visit from 09/21/2015 in [facility]  Total GAD-7 Score _0 Mini-Mental    [place] Visit from 10/30/2017 in [facility]  Total Score (max 30 points ) 27      PHQ2-9    [place] Visit from 10/22/2020 in [facility] Video Visit from 08/14/2020 in [place] [facility] Video Visit from 02/15/2020 in [facility] Office Visit from 07/26/2019 in [facility] [place] [facility] Office Visit from 12/21/2018 in [facility]  PHQ-2 Total Score _1 0  PHQ-9 Total Score _2 --  [place] Office Visit from 10/22/2020 in [facility] Video Visit from 08/14/2020 in [facility] ED from 06/14/2020 in [facility] CATEGORY No Risk No Risk No Risk        Assessment and Plan: MANHA AMATO is a 55 year old Caucasian female, widowed, has a history of PTSD, depression, anxiety, multiple medical problems including coronary artery disease, prolonged QT per history, PVC, gastric bypass, vitamin B12 deficiency, vitamin D deficiency, chronic pain on methadone was evaluated in office today.  Patient with sleep problems, does have chronic pain as well as headaches.  She will benefit from medication management as well as referral to neurology for evaluation of her headaches.  Plan PTSD-stable Continue CBT with Ms. Christina Hussami  MDD-in remission Trintellix 10 mg p.o. daily  GAD-improving Trintellix as  prescribed Continue psychotherapy sessions  Insomnia-unstable She will need sufficient management of her pain Start mirtazapine 15 mg p.o. nightly. Provided medication education.  Caffeine use disorder-unstable Provided counseling.  At risk for prolonged QT syndrome-we will order EKG.  Headaches-unspecified Unknown if medication induced. She does have a history of migraine, however reports these headaches are different. We will refer to neurology.  I have also provided patient an emotional support animal letter during the session today.  Patient to continue to follow up with cardiology for her cardiac problems, patient noted as having bradycardia.   Follow-up in clinic in office in 3 to 4 weeks.  This note was generated in part or whole with voice recognition software. Voice recognition is usually quite accurate but there are transcription errors that can and very often do occur. I apologize for any typographical errors that were not detected and corrected.       Ursula Alert, MD 10/22/2020, 1:24 PM

## 2020-10-22 NOTE — Patient Instructions (Signed)
Mirtazapine tablets What is this medication? MIRTAZAPINE (mir TAZ a peen) is used to treat depression. This medicine may be used for other purposes; ask your health care provider orpharmacist if you have questions. COMMON BRAND NAME(S): Remeron What should I tell my care team before I take this medication? They need to know if you have any of these conditions: bipolar disorder glaucoma kidney disease liver disease suicidal thoughts an unusual or allergic reaction to mirtazapine, other medicines, foods, dyes, or preservatives pregnant or trying to get pregnant breast-feeding How should I use this medication? Take this medicine by mouth with a glass of water. Follow the directions on the prescription label. Take your medicine at regular intervals. Do not take your medicine more often than directed. Do not stop taking this medicine suddenly except upon the advice of your doctor. Stopping this medicine too quickly maycause serious side effects or your condition may worsen. A special MedGuide will be given to you by the pharmacist with eachprescription and refill. Be sure to read this information carefully each time. Talk to your pediatrician regarding the use of this medicine in children.Special care may be needed. Overdosage: If you think you have taken too much of this medicine contact apoison control center or emergency room at once. NOTE: This medicine is only for you. Do not share this medicine with others. What if I miss a dose? If you miss a dose, take it as soon as you can. If it is almost time for yournext dose, take only that dose. Do not take double or extra doses. What may interact with this medication? Do not take this medicine with any of the following medications: linezolid MAOIs like Carbex, Eldepryl, Marplan, Nardil, and Parnate methylene blue (injected into a vein) This medicine may also interact with the following medications: alcohol antiviral medicines for HIV or  AIDS certain medicines that treat or prevent blood clots like warfarin certain medicines for depression, anxiety, or psychotic disturbances certain medicines for fungal infections like ketoconazole and itraconazole certain medicines for migraine headache like almotriptan, eletriptan, frovatriptan, naratriptan, rizatriptan, sumatriptan, zolmitriptan certain medicines for seizures like carbamazepine or phenytoin certain medicines for sleep cimetidine erythromycin fentanyl lithium medicines for blood pressure nefazodone rasagiline rifampin supplements like St. John's wort, kava kava, valerian tramadol tryptophan This list may not describe all possible interactions. Give your health care provider a list of all the medicines, herbs, non-prescription drugs, or dietary supplements you use. Also tell them if you smoke, drink alcohol, or use illegaldrugs. Some items may interact with your medicine. What should I watch for while using this medication? Tell your doctor if your symptoms do not get better or if they get worse. Visit your doctor or health care professional for regular checks on your progress. Because it may take several weeks to see the full effects of this medicine, itis important to continue your treatment as prescribed by your doctor. Patients and their families should watch out for new or worsening thoughts of suicide or depression. Also watch out for sudden changes in feelings such as feeling anxious, agitated, panicky, irritable, hostile, aggressive, impulsive, severely restless, overly excited and hyperactive, or not being able to sleep. If this happens, especially at the beginning of treatment or after a change indose, call your health care professional. Bonita Quin may get drowsy or dizzy. Do not drive, use machinery, or do anything that needs mental alertness until you know how this medicine affects you. Do not stand or sit up quickly, especially if you are  an older patient. This reduces  the risk of dizzy or fainting spells. Alcohol may interfere with the effect ofthis medicine. Avoid alcoholic drinks. This medicine may cause dry eyes and blurred vision. If you wear contact lenses you may feel some discomfort. Lubricating drops may help. See your eye doctorif the problem does not go away or is severe. Your mouth may get dry. Chewing sugarless gum or sucking hard candy, and drinking plenty of water may help. Contact your doctor if the problem does notgo away or is severe. What side effects may I notice from receiving this medication? Side effects that you should report to your doctor or health care professionalas soon as possible: allergic reactions like skin rash, itching or hives, swelling of the face, lips, or tongue anxious changes in vision chest pain confusion elevated mood, decreased need for sleep, racing thoughts, impulsive behavior eye pain fast, irregular heartbeat feeling faint or lightheaded, falls feeling agitated, angry, or irritable fever or chills, sore throat hallucination, loss of contact with reality loss of balance or coordination mouth sores redness, blistering, peeling or loosening of the skin, including inside the mouth restlessness, pacing, inability to keep still seizures stiff muscles suicidal thoughts or other mood changes trouble passing urine or change in the amount of urine trouble sleeping unusual bleeding or bruising unusually weak or tired vomiting Side effects that usually do not require medical attention (report to yourdoctor or health care professional if they continue or are bothersome): change in appetite constipation dizziness dry mouth muscle aches or pains nausea tired weight gain This list may not describe all possible side effects. Call your doctor for medical advice about side effects. You may report side effects to FDA at1-800-FDA-1088. Where should I keep my medication? Keep out of the reach of children. Store at  room temperature between 15 and 30 degrees C (59 and 86 degrees F) Protect from light and moisture. Throw away any unused medicine after theexpiration date. NOTE: This sheet is a summary. It may not cover all possible information. If you have questions about this medicine, talk to your doctor, pharmacist, orhealth care provider.  2022 Elsevier/Gold Standard (2015-08-02 17:30:45)    Please call - FOR EKG - [phone]

## 2020-10-24 ENCOUNTER — Encounter: Payer: Self-pay | Admitting: Oncology

## 2020-10-25 ENCOUNTER — Telehealth: Payer: Self-pay | Admitting: Oncology

## 2020-10-25 NOTE — Telephone Encounter (Signed)
Patient cancelled appts on 6/20. Please contact her to r/s: labs, then MD 1-2 days after labs

## 2020-10-25 NOTE — Telephone Encounter (Signed)
Left VM with patient to make her aware of appointments rescheduled (missed in June) per patient request via MyChart.

## 2020-10-26 NOTE — Telephone Encounter (Signed)
10/26/2020 Lab appt moved to 3:30 on 8/16 per pt request  SRW

## 2020-10-26 NOTE — Telephone Encounter (Signed)
Judeth Cornfield will you take a look at the lab appt and make the changes pt is requesting please. Thanks.

## 2020-10-27 ENCOUNTER — Emergency Department: Payer: Medicare Other

## 2020-10-27 ENCOUNTER — Emergency Department
Admission: EM | Admit: 2020-10-27 | Discharge: 2020-10-29 | Disposition: A | Discharge status: Other Acute Inpatient Hospital | Payer: Medicare Other | Attending: Emergency Medicine | Admitting: Emergency Medicine

## 2020-10-27 DIAGNOSIS — Z20822 Contact with and (suspected) exposure to covid-19: Secondary | ICD-10-CM | POA: Insufficient documentation

## 2020-10-27 DIAGNOSIS — Z79899 Other long term (current) drug therapy: Secondary | ICD-10-CM | POA: Diagnosis not present

## 2020-10-27 DIAGNOSIS — E876 Hypokalemia: Secondary | ICD-10-CM

## 2020-10-27 DIAGNOSIS — R519 Headache, unspecified: Secondary | ICD-10-CM | POA: Insufficient documentation

## 2020-10-27 DIAGNOSIS — I509 Heart failure, unspecified: Secondary | ICD-10-CM | POA: Diagnosis not present

## 2020-10-27 DIAGNOSIS — S82142A Displaced bicondylar fracture of left tibia, initial encounter for closed fracture: Secondary | ICD-10-CM | POA: Diagnosis not present

## 2020-10-27 DIAGNOSIS — I2510 Atherosclerotic heart disease of native coronary artery without angina pectoris: Secondary | ICD-10-CM | POA: Insufficient documentation

## 2020-10-27 DIAGNOSIS — J45909 Unspecified asthma, uncomplicated: Secondary | ICD-10-CM | POA: Insufficient documentation

## 2020-10-27 DIAGNOSIS — I110 Hypertensive heart disease with heart failure: Secondary | ICD-10-CM | POA: Insufficient documentation

## 2020-10-27 DIAGNOSIS — S8992XA Unspecified injury of left lower leg, initial encounter: Secondary | ICD-10-CM | POA: Diagnosis present

## 2020-10-27 MED ORDER — FENTANYL CITRATE (PF) 100 MCG/2ML IJ SOLN
100.0000 ug | Freq: Once | INTRAMUSCULAR | Status: DC
  Filled 2020-10-27: qty 2

## 2020-10-27 MED ORDER — FENTANYL CITRATE (PF) 100 MCG/2ML IJ SOLN
100.0000 ug | Freq: Once | INTRAMUSCULAR | Status: AC
  Administered 2020-10-27: 100 ug via INTRAVENOUS
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
## 2020-10-27 NOTE — ED Notes (Signed)
Patient transported to CT 
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
## 2020-10-27 NOTE — ED Notes (Signed)
X-ray at bedside

## 2020-10-28 ENCOUNTER — Other Ambulatory Visit: Payer: Self-pay

## 2020-10-28 ENCOUNTER — Encounter: Payer: Self-pay | Admitting: Internal Medicine

## 2020-10-28 ENCOUNTER — Inpatient Hospital Stay
Admission: AD | Admit: 2020-10-28 | Payer: Medicare Other | Source: Other Acute Inpatient Hospital | Admitting: Internal Medicine

## 2020-10-28 LAB — RESP PANEL BY RT-PCR (FLU A&B, COVID) ARPGX2
Influenza A by PCR: NEGATIVE
Influenza B by PCR: NEGATIVE
SARS Coronavirus 2 by RT PCR: NEGATIVE

## 2020-10-28 MED ORDER — FENTANYL CITRATE (PF) 100 MCG/2ML IJ SOLN
100.0000 ug | Freq: Once | INTRAMUSCULAR | Status: AC
  Administered 2020-10-28: 100 ug via INTRAVENOUS
  Filled 2020-10-28: qty 2

## 2020-10-28 MED ORDER — MORPHINE SULFATE (PF) 4 MG/ML IV SOLN
4.0000 mg | INTRAVENOUS | Status: DC | PRN
  Administered 2020-10-28 – 2020-10-29 (×4): 4 mg via INTRAVENOUS
  Filled 2020-10-28 (×4): qty 1

## 2020-10-28 MED ORDER — METOCLOPRAMIDE HCL 5 MG/ML IJ SOLN
10.0000 mg | Freq: Once | INTRAMUSCULAR | Status: AC
  Administered 2020-10-28: 10 mg via INTRAVENOUS
  Filled 2020-10-28: qty 2

## 2020-10-28 MED ORDER — ONDANSETRON HCL 4 MG/2ML IJ SOLN
4.0000 mg | Freq: Once | INTRAMUSCULAR | Status: AC
  Administered 2020-10-28: 4 mg via INTRAVENOUS
  Filled 2020-10-28: qty 2

## 2020-10-28 MED ORDER — KETAMINE HCL 10 MG/ML IJ SOLN
0.3000 mg/kg | Freq: Once | INTRAMUSCULAR | Status: AC
  Administered 2020-10-28: 33 mg via INTRAVENOUS
  Filled 2020-10-28: qty 1

## 2020-10-28 MED ORDER — FENTANYL CITRATE (PF) 100 MCG/2ML IJ SOLN
100.0000 ug | INTRAMUSCULAR | Status: DC | PRN
Start: 2020-10-28 — End: 2020-10-28
  Administered 2020-10-28 (×3): 100 ug via INTRAVENOUS
  Filled 2020-10-28 (×3): qty 2

## 2020-10-28 MED ORDER — MORPHINE SULFATE (PF) 4 MG/ML IV SOLN
4.0000 mg | Freq: Once | INTRAVENOUS | Status: AC
  Administered 2020-10-28: 4 mg via INTRAVENOUS
  Filled 2020-10-28: qty 1

## 2020-10-28 MED ORDER — LACTATED RINGERS IV SOLN: INTRAVENOUS | Status: DC
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
## 2020-10-28 NOTE — ED Notes (Signed)
Pt c/o "feeling hot". Thermometer adjusted in pt rm. Call light within reach.

## 2020-10-28 NOTE — ED Notes (Signed)
Pt is requesting more pain medication. Dr. Don Perking informed via epic secure chat.
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
## 2020-10-28 NOTE — ED Notes (Signed)
Adjusted leg and applied ice packs to assist for comfort

## 2020-10-28 NOTE — ED Notes (Signed)
Ice packs changed out, pt requests to leave immobilizer loose for comfort.

## 2020-10-28 NOTE — ED Notes (Signed)
Pt transferred to a hospital bed with 4 person assist and a sliding board. Pt as comfortable as can be at this time. Call light within reach. Pt has no further needs at this time.

## 2020-10-28 NOTE — ED Notes (Signed)
Pt given warm blankets. Pt requesting pain meds, RN made aware.

## 2020-10-28 NOTE — ED Notes (Signed)
Pt's leg wrapped with coban, knee immobilizer applied, and ice packs applied per trauma's request. Pt tolerated ketamine and procedure well.

## 2020-10-28 NOTE — ED Notes (Signed)
Pt given pain meds and adjusted in bed. Aware of waiting on transfer.

## 2020-10-28 NOTE — ED Notes (Signed)
Pt resting in bed watching tv. 

## 2020-10-29 ENCOUNTER — Emergency Department: Payer: Medicare Other

## 2020-10-29 ENCOUNTER — Inpatient Hospital Stay (HOSPITAL_COMMUNITY): Payer: Medicare Other

## 2020-10-29 ENCOUNTER — Encounter (HOSPITAL_COMMUNITY): Payer: Self-pay | Admitting: Orthopedic Surgery

## 2020-10-29 ENCOUNTER — Encounter (HOSPITAL_COMMUNITY)
Admission: AD | Disposition: A | Discharge status: Home Health | Payer: Self-pay | Source: Other Acute Inpatient Hospital | Attending: Internal Medicine

## 2020-10-29 ENCOUNTER — Inpatient Hospital Stay (HOSPITAL_COMMUNITY): Payer: Medicare Other | Admitting: Certified Registered"

## 2020-10-29 ENCOUNTER — Inpatient Hospital Stay (HOSPITAL_COMMUNITY)
Admission: AD | Admit: 2020-10-29 | Discharge: 2020-11-05 | DRG: 493 | Disposition: A | Discharge status: Home Health | Payer: Medicare Other | Source: Other Acute Inpatient Hospital | Attending: Internal Medicine | Admitting: Internal Medicine

## 2020-10-29 DIAGNOSIS — W19XXXD Unspecified fall, subsequent encounter: Secondary | ICD-10-CM | POA: Diagnosis not present

## 2020-10-29 DIAGNOSIS — E669 Obesity, unspecified: Secondary | ICD-10-CM | POA: Diagnosis not present

## 2020-10-29 DIAGNOSIS — S82292A Other fracture of shaft of left tibia, initial encounter for closed fracture: Secondary | ICD-10-CM | POA: Diagnosis present

## 2020-10-29 DIAGNOSIS — R7401 Elevation of levels of liver transaminase levels: Secondary | ICD-10-CM | POA: Diagnosis present

## 2020-10-29 DIAGNOSIS — I110 Hypertensive heart disease with heart failure: Secondary | ICD-10-CM | POA: Diagnosis present

## 2020-10-29 DIAGNOSIS — J45909 Unspecified asthma, uncomplicated: Secondary | ICD-10-CM | POA: Diagnosis present

## 2020-10-29 DIAGNOSIS — F419 Anxiety disorder, unspecified: Secondary | ICD-10-CM | POA: Diagnosis present

## 2020-10-29 DIAGNOSIS — S82142A Displaced bicondylar fracture of left tibia, initial encounter for closed fracture: Principal | ICD-10-CM | POA: Diagnosis present

## 2020-10-29 DIAGNOSIS — R350 Frequency of micturition: Secondary | ICD-10-CM | POA: Diagnosis present

## 2020-10-29 DIAGNOSIS — I4581 Long QT syndrome: Secondary | ICD-10-CM | POA: Diagnosis not present

## 2020-10-29 DIAGNOSIS — T148XXA Other injury of unspecified body region, initial encounter: Secondary | ICD-10-CM

## 2020-10-29 DIAGNOSIS — S83282A Other tear of lateral meniscus, current injury, left knee, initial encounter: Secondary | ICD-10-CM | POA: Diagnosis present

## 2020-10-29 DIAGNOSIS — E611 Iron deficiency: Secondary | ICD-10-CM | POA: Diagnosis present

## 2020-10-29 DIAGNOSIS — Z79891 Long term (current) use of opiate analgesic: Secondary | ICD-10-CM | POA: Diagnosis not present

## 2020-10-29 DIAGNOSIS — M5441 Lumbago with sciatica, right side: Secondary | ICD-10-CM | POA: Diagnosis present

## 2020-10-29 DIAGNOSIS — Z888 Allergy status to other drugs, medicaments and biological substances status: Secondary | ICD-10-CM

## 2020-10-29 DIAGNOSIS — I2510 Atherosclerotic heart disease of native coronary artery without angina pectoris: Secondary | ICD-10-CM | POA: Diagnosis present

## 2020-10-29 DIAGNOSIS — S82112A Displaced fracture of left tibial spine, initial encounter for closed fracture: Secondary | ICD-10-CM | POA: Diagnosis present

## 2020-10-29 DIAGNOSIS — Z801 Family history of malignant neoplasm of trachea, bronchus and lung: Secondary | ICD-10-CM

## 2020-10-29 DIAGNOSIS — I5032 Chronic diastolic (congestive) heart failure: Secondary | ICD-10-CM | POA: Diagnosis present

## 2020-10-29 DIAGNOSIS — E559 Vitamin D deficiency, unspecified: Secondary | ICD-10-CM | POA: Diagnosis present

## 2020-10-29 DIAGNOSIS — E876 Hypokalemia: Secondary | ICD-10-CM | POA: Diagnosis present

## 2020-10-29 DIAGNOSIS — Z20822 Contact with and (suspected) exposure to covid-19: Secondary | ICD-10-CM | POA: Diagnosis present

## 2020-10-29 DIAGNOSIS — E785 Hyperlipidemia, unspecified: Secondary | ICD-10-CM | POA: Diagnosis present

## 2020-10-29 DIAGNOSIS — Z419 Encounter for procedure for purposes other than remedying health state, unspecified: Secondary | ICD-10-CM

## 2020-10-29 DIAGNOSIS — Z9884 Bariatric surgery status: Secondary | ICD-10-CM

## 2020-10-29 DIAGNOSIS — I421 Obstructive hypertrophic cardiomyopathy: Secondary | ICD-10-CM | POA: Diagnosis present

## 2020-10-29 DIAGNOSIS — M5440 Lumbago with sciatica, unspecified side: Secondary | ICD-10-CM | POA: Diagnosis not present

## 2020-10-29 DIAGNOSIS — Z91030 Bee allergy status: Secondary | ICD-10-CM

## 2020-10-29 DIAGNOSIS — Z800 Family history of malignant neoplasm of digestive organs: Secondary | ICD-10-CM

## 2020-10-29 DIAGNOSIS — D62 Acute posthemorrhagic anemia: Secondary | ICD-10-CM | POA: Diagnosis present

## 2020-10-29 DIAGNOSIS — F32A Depression, unspecified: Secondary | ICD-10-CM | POA: Diagnosis present

## 2020-10-29 DIAGNOSIS — S82839A Other fracture of upper and lower end of unspecified fibula, initial encounter for closed fracture: Secondary | ICD-10-CM | POA: Diagnosis present

## 2020-10-29 DIAGNOSIS — G8929 Other chronic pain: Secondary | ICD-10-CM | POA: Diagnosis present

## 2020-10-29 DIAGNOSIS — Z8249 Family history of ischemic heart disease and other diseases of the circulatory system: Secondary | ICD-10-CM

## 2020-10-29 DIAGNOSIS — Z880 Allergy status to penicillin: Secondary | ICD-10-CM

## 2020-10-29 DIAGNOSIS — Z79899 Other long term (current) drug therapy: Secondary | ICD-10-CM

## 2020-10-29 DIAGNOSIS — W19XXXA Unspecified fall, initial encounter: Secondary | ICD-10-CM | POA: Diagnosis present

## 2020-10-29 DIAGNOSIS — Z833 Family history of diabetes mellitus: Secondary | ICD-10-CM

## 2020-10-29 DIAGNOSIS — Z803 Family history of malignant neoplasm of breast: Secondary | ICD-10-CM

## 2020-10-29 DIAGNOSIS — Z8261 Family history of arthritis: Secondary | ICD-10-CM

## 2020-10-29 DIAGNOSIS — M5442 Lumbago with sciatica, left side: Secondary | ICD-10-CM | POA: Diagnosis present

## 2020-10-29 HISTORY — PX: EXTERNAL FIXATION LEG: SHX1549

## 2020-10-29 LAB — COMPREHENSIVE METABOLIC PANEL
ALT: 24 U/L (ref 0–44)
AST: 78 U/L — ABNORMAL HIGH (ref 15–41)
Albumin: 3.1 g/dL — ABNORMAL LOW (ref 3.5–5.0)
Alkaline Phosphatase: 98 U/L (ref 38–126)
Anion gap: 8 (ref 5–15)
BUN: 12 mg/dL (ref 6–20)
CO2: 31 mmol/L (ref 22–32)
Calcium: 8.5 mg/dL — ABNORMAL LOW (ref 8.9–10.3)
Chloride: 101 mmol/L (ref 98–111)
Creatinine, Ser: 0.75 mg/dL (ref 0.44–1.00)
GFR, Estimated: >60 mL/min (ref >60)
Glucose, Bld: 127 mg/dL — ABNORMAL HIGH (ref 70–99)
Potassium: 2.9 mmol/L — ABNORMAL LOW (ref 3.5–5.1)
Sodium: 140 mmol/L (ref 135–145)
Total Bilirubin: 1.3 mg/dL — ABNORMAL HIGH (ref 0.3–1.2)
Total Protein: 6.5 g/dL (ref 6.5–8.1)

## 2020-10-29 LAB — MAGNESIUM
Magnesium: 1.5 mg/dL — ABNORMAL LOW (ref 1.7–2.4)
Magnesium: 1.9 mg/dL (ref 1.7–2.4)

## 2020-10-29 LAB — POCT I-STAT, CHEM 8
BUN: 10 mg/dL (ref 6–20)
Calcium, Ion: 1.12 mmol/L — ABNORMAL LOW (ref 1.15–1.40)
Chloride: 96 mmol/L — ABNORMAL LOW (ref 98–111)
Creatinine, Ser: 0.6 mg/dL (ref 0.44–1.00)
Glucose, Bld: 114 mg/dL — ABNORMAL HIGH (ref 70–99)
HCT: 37 % (ref 36.0–46.0)
Hemoglobin: 12.6 g/dL (ref 12.0–15.0)
Potassium: 3 mmol/L — ABNORMAL LOW (ref 3.5–5.1)
Sodium: 140 mmol/L (ref 135–145)
TCO2: 32 mmol/L (ref 22–32)

## 2020-10-29 LAB — CBC WITH DIFFERENTIAL/PLATELET
Abs Immature Granulocytes: 0.05 10*3/uL (ref 0.00–0.07)
Basophils Absolute: 0 10*3/uL (ref 0.0–0.1)
Basophils Relative: 0 %
Eosinophils Absolute: 0 10*3/uL (ref 0.0–0.5)
Eosinophils Relative: 0 %
HCT: 36.9 % (ref 36.0–46.0)
Hemoglobin: 12.2 g/dL (ref 12.0–15.0)
Immature Granulocytes: 0 %
Lymphocytes Relative: 10 %
Lymphs Abs: 1.2 10*3/uL (ref 0.7–4.0)
MCH: 29.8 pg (ref 26.0–34.0)
MCHC: 33.1 g/dL (ref 30.0–36.0)
MCV: 90 fL (ref 80.0–100.0)
Monocytes Absolute: 0.8 10*3/uL (ref 0.1–1.0)
Monocytes Relative: 6 %
Neutro Abs: 10 10*3/uL — ABNORMAL HIGH (ref 1.7–7.7)
Neutrophils Relative %: 84 %
Platelets: 222 10*3/uL (ref 150–400)
RBC: 4.1 MIL/uL (ref 3.87–5.11)
RDW: 12.7 % (ref 11.5–15.5)
WBC: 12.1 10*3/uL — ABNORMAL HIGH (ref 4.0–10.5)
nRBC: 0 % (ref 0.0–0.2)

## 2020-10-29 LAB — PROTIME-INR
INR: 1.1 (ref 0.8–1.2)
Prothrombin Time: 14.4 seconds (ref 11.4–15.2)

## 2020-10-29 SURGERY — EXTERNAL FIXATION, LOWER EXTREMITY
Anesthesia: General | Laterality: Left

## 2020-10-29 MED ORDER — ENOXAPARIN SODIUM 30 MG/0.3ML IJ SOSY
30.0000 mg | PREFILLED_SYRINGE | Freq: Two times a day (BID) | INTRAMUSCULAR | Status: AC
  Administered 2020-10-30 – 2020-11-01 (×6): 30 mg via SUBCUTANEOUS
  Filled 2020-10-29 (×6): qty 0.3

## 2020-10-29 MED ORDER — LACTATED RINGERS IV SOLN: INTRAVENOUS | Status: DC

## 2020-10-29 MED ORDER — ORAL CARE MOUTH RINSE: 15.0000 mL | Freq: Once | OROMUCOSAL | Status: AC

## 2020-10-29 MED ORDER — PHENOL 1.4 % MT LIQD: 1.0000 | OROMUCOSAL | Status: DC | PRN

## 2020-10-29 MED ORDER — MIDAZOLAM HCL 5 MG/5ML IJ SOLN
INTRAMUSCULAR | Status: DC | PRN
  Administered 2020-10-29: 2 mg via INTRAVENOUS

## 2020-10-29 MED ORDER — GABAPENTIN 600 MG PO TABS
600.0000 mg | ORAL_TABLET | Freq: Every day | ORAL | Status: DC
  Administered 2020-10-29 – 2020-11-04 (×7): 600 mg via ORAL
  Filled 2020-10-29 (×8): qty 1

## 2020-10-29 MED ORDER — POTASSIUM CHLORIDE 10 MEQ/100ML IV SOLN
10.0000 meq | INTRAVENOUS | Status: DC
  Filled 2020-10-29 (×2): qty 100

## 2020-10-29 MED ORDER — METHADONE HCL 10 MG PO TABS
20.0000 mg | ORAL_TABLET | Freq: Three times a day (TID) | ORAL | Status: DC
  Administered 2020-10-29 – 2020-11-05 (×21): 20 mg via ORAL
  Filled 2020-10-29 (×23): qty 2

## 2020-10-29 MED ORDER — FENTANYL CITRATE (PF) 250 MCG/5ML IJ SOLN
INTRAMUSCULAR | Status: DC | PRN
  Administered 2020-10-29: 100 ug via INTRAVENOUS
  Administered 2020-10-29: 150 ug via INTRAVENOUS

## 2020-10-29 MED ORDER — MIDAZOLAM HCL 2 MG/2ML IJ SOLN
INTRAMUSCULAR | Status: AC
  Filled 2020-10-29: qty 2

## 2020-10-29 MED ORDER — POTASSIUM CHLORIDE CRYS ER 20 MEQ PO TBCR
40.0000 meq | EXTENDED_RELEASE_TABLET | ORAL | Status: AC
  Administered 2020-10-29: 40 meq via ORAL
  Filled 2020-10-29: qty 2

## 2020-10-29 MED ORDER — ALUM & MAG HYDROXIDE-SIMETH 200-200-20 MG/5ML PO SUSP: 30.0000 mL | ORAL | Status: DC | PRN

## 2020-10-29 MED ORDER — SUGAMMADEX SODIUM 200 MG/2ML IV SOLN
INTRAVENOUS | Status: DC | PRN
  Administered 2020-10-29: 200 mg via INTRAVENOUS

## 2020-10-29 MED ORDER — KETOROLAC TROMETHAMINE 30 MG/ML IJ SOLN
INTRAMUSCULAR | Status: AC
  Filled 2020-10-29: qty 1

## 2020-10-29 MED ORDER — POLYETHYLENE GLYCOL 3350 17 G PO PACK: 17.0000 g | PACK | Freq: Every day | ORAL | Status: DC

## 2020-10-29 MED ORDER — METHOCARBAMOL 500 MG PO TABS
1000.0000 mg | ORAL_TABLET | Freq: Three times a day (TID) | ORAL | Status: DC
Start: 2020-10-29 — End: 2020-11-05
  Administered 2020-10-29 – 2020-11-05 (×21): 1000 mg via ORAL
  Filled 2020-10-29 (×22): qty 2

## 2020-10-29 MED ORDER — CEFAZOLIN SODIUM-DEXTROSE 2-4 GM/100ML-% IV SOLN
INTRAVENOUS | Status: AC
  Filled 2020-10-29: qty 100

## 2020-10-29 MED ORDER — BISACODYL 5 MG PO TBEC: 5.0000 mg | DELAYED_RELEASE_TABLET | Freq: Every day | ORAL | Status: DC | PRN

## 2020-10-29 MED ORDER — KETAMINE HCL 10 MG/ML IJ SOLN
INTRAMUSCULAR | Status: DC | PRN
  Administered 2020-10-29 (×2): 20 mg via INTRAVENOUS
  Administered 2020-10-29: 10 mg via INTRAVENOUS

## 2020-10-29 MED ORDER — POTASSIUM CHLORIDE 10 MEQ/50ML IV SOLN: 10.0000 meq | INTRAVENOUS | Status: DC

## 2020-10-29 MED ORDER — ACETAMINOPHEN 325 MG PO TABS
325.0000 mg | ORAL_TABLET | Freq: Four times a day (QID) | ORAL | Status: DC | PRN
  Administered 2020-11-01: 650 mg via ORAL
  Filled 2020-10-29 (×2): qty 2

## 2020-10-29 MED ORDER — ACETAMINOPHEN 10 MG/ML IV SOLN: 1000.0000 mg | Freq: Once | INTRAVENOUS | Status: DC | PRN

## 2020-10-29 MED ORDER — MAGNESIUM SULFATE 4 GM/100ML IV SOLN
4.0000 g | Freq: Once | INTRAVENOUS | Status: AC
  Administered 2020-10-29: 4 g via INTRAVENOUS
  Filled 2020-10-29: qty 100

## 2020-10-29 MED ORDER — MAGNESIUM SULFATE 2 GM/50ML IV SOLN: 2.0000 g | Freq: Once | INTRAVENOUS | Status: DC

## 2020-10-29 MED ORDER — OXYCODONE HCL 5 MG PO TABS
5.0000 mg | ORAL_TABLET | ORAL | Status: DC | PRN
  Administered 2020-10-30 (×2): 10 mg via ORAL
  Filled 2020-10-29 (×3): qty 2

## 2020-10-29 MED ORDER — DOCUSATE SODIUM 100 MG PO CAPS
100.0000 mg | ORAL_CAPSULE | Freq: Two times a day (BID) | ORAL | Status: DC
  Administered 2020-10-29 – 2020-11-05 (×14): 100 mg via ORAL
  Filled 2020-10-29 (×14): qty 1

## 2020-10-29 MED ORDER — VORTIOXETINE HBR 5 MG PO TABS
10.0000 mg | ORAL_TABLET | Freq: Every day | ORAL | Status: DC
  Administered 2020-10-29: 10 mg via ORAL
  Filled 2020-10-29 (×2): qty 2

## 2020-10-29 MED ORDER — ACETAMINOPHEN 500 MG PO TABS
1000.0000 mg | ORAL_TABLET | Freq: Four times a day (QID) | ORAL | Status: DC
  Administered 2020-10-29 – 2020-11-05 (×25): 1000 mg via ORAL
  Filled 2020-10-29 (×25): qty 2

## 2020-10-29 MED ORDER — CEFAZOLIN SODIUM-DEXTROSE 2-3 GM-%(50ML) IV SOLR
INTRAVENOUS | Status: DC | PRN
  Administered 2020-10-29: 2 g via INTRAVENOUS

## 2020-10-29 MED ORDER — DEXAMETHASONE SODIUM PHOSPHATE 10 MG/ML IJ SOLN
INTRAMUSCULAR | Status: DC | PRN
  Administered 2020-10-29: 10 mg via INTRAVENOUS

## 2020-10-29 MED ORDER — ROSUVASTATIN CALCIUM 10 MG PO TABS
10.0000 mg | ORAL_TABLET | Freq: Every day | ORAL | Status: DC
  Filled 2020-10-29: qty 1

## 2020-10-29 MED ORDER — POLYETHYLENE GLYCOL 3350 17 G PO PACK
17.0000 g | PACK | Freq: Every day | ORAL | Status: DC
  Administered 2020-10-30: 17 g via ORAL
  Filled 2020-10-29 (×7): qty 1

## 2020-10-29 MED ORDER — METHADONE HCL 10 MG PO TABS
10.0000 mg | ORAL_TABLET | Freq: Once | ORAL | Status: AC
  Administered 2020-10-29: 20 mg via ORAL
  Filled 2020-10-29: qty 2

## 2020-10-29 MED ORDER — METHADONE HCL 10 MG PO TABS
20.0000 mg | ORAL_TABLET | ORAL | Status: AC
Start: 2020-10-29 — End: 2020-10-29
  Administered 2020-10-29: 20 mg via ORAL
  Filled 2020-10-29: qty 2

## 2020-10-29 MED ORDER — KETAMINE HCL 50 MG/5ML IJ SOSY
PREFILLED_SYRINGE | INTRAMUSCULAR | Status: AC
  Filled 2020-10-29: qty 5

## 2020-10-29 MED ORDER — METOCLOPRAMIDE HCL 5 MG PO TABS: 5.0000 mg | ORAL_TABLET | Freq: Three times a day (TID) | ORAL | Status: DC | PRN

## 2020-10-29 MED ORDER — ROSUVASTATIN CALCIUM 5 MG PO TABS
10.0000 mg | ORAL_TABLET | Freq: Every day | ORAL | Status: DC
  Administered 2020-10-29 – 2020-11-05 (×8): 10 mg via ORAL
  Filled 2020-10-29 (×8): qty 2

## 2020-10-29 MED ORDER — GABAPENTIN 250 MG/5ML PO SOLN
800.0000 mg | ORAL | Status: AC
  Administered 2020-10-29: 800 mg via ORAL
  Filled 2020-10-29: qty 16

## 2020-10-29 MED ORDER — KETOROLAC TROMETHAMINE 30 MG/ML IJ SOLN
30.0000 mg | Freq: Once | INTRAMUSCULAR | Status: DC
  Administered 2020-10-29: 30 mg via INTRAVENOUS

## 2020-10-29 MED ORDER — POTASSIUM CHLORIDE 10 MEQ/100ML IV SOLN
10.0000 meq | INTRAVENOUS | Status: DC
  Administered 2020-10-29 (×4): 10 meq via INTRAVENOUS
  Filled 2020-10-29 (×4): qty 100

## 2020-10-29 MED ORDER — BACLOFEN 10 MG PO TABS: 10.0000 mg | ORAL_TABLET | Freq: Three times a day (TID) | ORAL | Status: DC | PRN

## 2020-10-29 MED ORDER — ROCURONIUM BROMIDE 10 MG/ML (PF) SYRINGE
PREFILLED_SYRINGE | INTRAVENOUS | Status: DC | PRN
  Administered 2020-10-29 (×3): 30 mg via INTRAVENOUS
  Administered 2020-10-29: 40 mg via INTRAVENOUS

## 2020-10-29 MED ORDER — CHLORHEXIDINE GLUCONATE 0.12 % MT SOLN
15.0000 mL | Freq: Once | OROMUCOSAL | Status: AC
  Filled 2020-10-29: qty 15

## 2020-10-29 MED ORDER — CEFAZOLIN SODIUM-DEXTROSE 2-4 GM/100ML-% IV SOLN
2.0000 g | Freq: Four times a day (QID) | INTRAVENOUS | Status: AC
  Administered 2020-10-29 – 2020-10-30 (×2): 2 g via INTRAVENOUS
  Filled 2020-10-29 (×2): qty 100

## 2020-10-29 MED ORDER — POTASSIUM CHLORIDE 10 MEQ/100ML IV SOLN
10.0000 meq | INTRAVENOUS | Status: AC
Start: 2020-10-29 — End: 2020-10-29

## 2020-10-29 MED ORDER — DEXTROSE 5 % IV SOLN
500.0000 mg | Freq: Three times a day (TID) | INTRAVENOUS | Status: DC
  Filled 2020-10-29: qty 5

## 2020-10-29 MED ORDER — CARVEDILOL 6.25 MG PO TABS
6.2500 mg | ORAL_TABLET | Freq: Two times a day (BID) | ORAL | Status: DC
  Administered 2020-10-29: 6.25 mg via ORAL
  Filled 2020-10-29: qty 1

## 2020-10-29 MED ORDER — HYDROMORPHONE HCL 1 MG/ML IJ SOLN
0.5000 mg | INTRAMUSCULAR | Status: DC | PRN
  Administered 2020-10-30 – 2020-11-05 (×11): 1 mg via INTRAVENOUS
  Filled 2020-10-29 (×11): qty 1

## 2020-10-29 MED ORDER — POTASSIUM CHLORIDE 20 MEQ PO PACK: 40.0000 meq | PACK | Freq: Once | ORAL | Status: DC

## 2020-10-29 MED ORDER — PROPOFOL 10 MG/ML IV BOLUS
INTRAVENOUS | Status: AC
  Filled 2020-10-29: qty 20

## 2020-10-29 MED ORDER — ISOSORBIDE MONONITRATE ER 60 MG PO TB24
60.0000 mg | ORAL_TABLET | Freq: Every day | ORAL | Status: DC
  Administered 2020-10-30 – 2020-11-05 (×7): 60 mg via ORAL
  Filled 2020-10-29 (×7): qty 1

## 2020-10-29 MED ORDER — MAGNESIUM OXIDE -MG SUPPLEMENT 400 (240 MG) MG PO TABS
400.0000 mg | ORAL_TABLET | Freq: Every day | ORAL | Status: DC
  Administered 2020-10-30 – 2020-11-05 (×7): 400 mg via ORAL
  Filled 2020-10-29 (×8): qty 1

## 2020-10-29 MED ORDER — CHLORHEXIDINE GLUCONATE 0.12 % MT SOLN
OROMUCOSAL | Status: AC
  Administered 2020-10-29: 15 mL via OROMUCOSAL
  Filled 2020-10-29: qty 15

## 2020-10-29 MED ORDER — ONDANSETRON HCL 4 MG/2ML IJ SOLN
INTRAMUSCULAR | Status: DC | PRN
  Administered 2020-10-29: 4 mg via INTRAVENOUS

## 2020-10-29 MED ORDER — HYDROMORPHONE HCL 1 MG/ML IJ SOLN
0.2500 mg | INTRAMUSCULAR | Status: DC | PRN
  Administered 2020-10-29 (×2): 0.5 mg via INTRAVENOUS

## 2020-10-29 MED ORDER — METHADONE HCL 10 MG PO TABS: 10.0000 mg | ORAL_TABLET | Freq: Three times a day (TID) | ORAL | Status: DC

## 2020-10-29 MED ORDER — GABAPENTIN 100 MG PO CAPS
200.0000 mg | ORAL_CAPSULE | Freq: Two times a day (BID) | ORAL | Status: DC
  Administered 2020-10-30 – 2020-11-05 (×13): 200 mg via ORAL
  Filled 2020-10-29 (×15): qty 2

## 2020-10-29 MED ORDER — METOCLOPRAMIDE HCL 5 MG/ML IJ SOLN: 5.0000 mg | Freq: Three times a day (TID) | INTRAMUSCULAR | Status: DC | PRN

## 2020-10-29 MED ORDER — ENOXAPARIN SODIUM 40 MG/0.4ML IJ SOSY: 40.0000 mg | PREFILLED_SYRINGE | INTRAMUSCULAR | Status: DC

## 2020-10-29 MED ORDER — PROPOFOL 10 MG/ML IV BOLUS
INTRAVENOUS | Status: DC | PRN
  Administered 2020-10-29: 200 mg via INTRAVENOUS

## 2020-10-29 MED ORDER — HYDROMORPHONE HCL 1 MG/ML IJ SOLN
INTRAMUSCULAR | Status: AC
  Filled 2020-10-29: qty 1

## 2020-10-29 MED ORDER — OXYCODONE HCL 5 MG/5ML PO SOLN: 5.0000 mg | Freq: Once | ORAL | Status: DC | PRN

## 2020-10-29 MED ORDER — CARVEDILOL 6.25 MG PO TABS
6.2500 mg | ORAL_TABLET | Freq: Two times a day (BID) | ORAL | Status: DC
  Administered 2020-10-29 – 2020-11-05 (×13): 6.25 mg via ORAL
  Filled 2020-10-29 (×14): qty 1

## 2020-10-29 MED ORDER — 0.9 % SODIUM CHLORIDE (POUR BTL) OPTIME
TOPICAL | Status: DC | PRN
  Administered 2020-10-29: 1000 mL

## 2020-10-29 MED ORDER — FENTANYL CITRATE (PF) 100 MCG/2ML IJ SOLN
50.0000 ug | INTRAMUSCULAR | Status: AC | PRN
  Administered 2020-10-29 (×2): 50 ug via INTRAVENOUS
  Filled 2020-10-29: qty 2

## 2020-10-29 MED ORDER — MENTHOL 3 MG MT LOZG: 1.0000 | LOZENGE | OROMUCOSAL | Status: DC | PRN

## 2020-10-29 MED ORDER — LIDOCAINE 2% (20 MG/ML) 5 ML SYRINGE
INTRAMUSCULAR | Status: DC | PRN
Start: 2020-10-29 — End: 2020-10-29
  Administered 2020-10-29: 60 mg via INTRAVENOUS

## 2020-10-29 MED ORDER — HYDROMORPHONE HCL 1 MG/ML IJ SOLN
1.0000 mg | Freq: Once | INTRAMUSCULAR | Status: AC
  Administered 2020-10-29: 1 mg via INTRAVENOUS
  Filled 2020-10-29: qty 1

## 2020-10-29 MED ORDER — FENTANYL CITRATE (PF) 250 MCG/5ML IJ SOLN
INTRAMUSCULAR | Status: AC
  Filled 2020-10-29: qty 5

## 2020-10-29 MED ORDER — OXYCODONE HCL 5 MG PO TABS: 5.0000 mg | ORAL_TABLET | Freq: Once | ORAL | Status: DC | PRN

## 2020-10-29 MED ORDER — PHENYLEPHRINE HCL-NACL 20-0.9 MG/250ML-% IV SOLN
INTRAVENOUS | Status: AC
  Filled 2020-10-29: qty 250

## 2020-10-29 MED ORDER — LACTATED RINGERS IV SOLN: INTRAVENOUS | Status: DC | PRN

## 2020-10-29 MED ORDER — AMISULPRIDE (ANTIEMETIC) 5 MG/2ML IV SOLN: 10.0000 mg | Freq: Once | INTRAVENOUS | Status: DC | PRN

## 2020-10-29 SURGICAL SUPPLY — 65 items
BAG COUNTER SPONGE SURGICOUNT (BAG) ×2 IMPLANT
BANDAGE ESMARK 6X9 LF (GAUZE/BANDAGES/DRESSINGS) ×1 IMPLANT
BIT DRILL 3.3 LONG (BIT) ×1 IMPLANT
BIT DRILL QC 3.3X195 (BIT) ×1 IMPLANT
BNDG COHESIVE 6X5 TAN STRL LF (GAUZE/BANDAGES/DRESSINGS) IMPLANT
BNDG ELASTIC 4X5.8 VLCR STR LF (GAUZE/BANDAGES/DRESSINGS) ×2 IMPLANT
BNDG ELASTIC 6X5.8 VLCR STR LF (GAUZE/BANDAGES/DRESSINGS) ×2 IMPLANT
BNDG ESMARK 6X9 LF (GAUZE/BANDAGES/DRESSINGS)
BNDG GAUZE ELAST 4 BULKY (GAUZE/BANDAGES/DRESSINGS) ×2 IMPLANT
BRUSH SCRUB EZ PLAIN DRY (MISCELLANEOUS) ×3 IMPLANT
CAP LOCK NCB (Cap) ×4 IMPLANT
COVER SURGICAL LIGHT HANDLE (MISCELLANEOUS) ×4 IMPLANT
CUFF TOURN SGL QUICK 18X4 (TOURNIQUET CUFF) IMPLANT
DRAPE C-ARM 42X72 X-RAY (DRAPES) IMPLANT
DRAPE C-ARMOR (DRAPES) ×2 IMPLANT
DRAPE U-SHAPE 47X51 STRL (DRAPES) ×2 IMPLANT
DRSG ADAPTIC 3X8 NADH LF (GAUZE/BANDAGES/DRESSINGS) ×1 IMPLANT
DRSG MEPITEL 4X7.2 (GAUZE/BANDAGES/DRESSINGS) ×2 IMPLANT
ELECT REM PT RETURN 9FT ADLT (ELECTROSURGICAL) ×2
ELECTRODE REM PT RTRN 9FT ADLT (ELECTROSURGICAL) ×1 IMPLANT
EVACUATOR 1/8 PVC DRAIN (DRAIN) IMPLANT
GAUZE SPONGE 4X4 12PLY STRL (GAUZE/BANDAGES/DRESSINGS) ×2 IMPLANT
GLOVE SRG 8 PF TXTR STRL LF DI (GLOVE) ×1 IMPLANT
GLOVE SURG ENC MOIS LTX SZ7.5 (GLOVE) ×2 IMPLANT
GLOVE SURG ENC MOIS LTX SZ8 (GLOVE) ×2 IMPLANT
GLOVE SURG UNDER POLY LF SZ7.5 (GLOVE) ×2 IMPLANT
GLOVE SURG UNDER POLY LF SZ8 (GLOVE) ×1
GOWN STRL REUS W/ TWL LRG LVL3 (GOWN DISPOSABLE) ×2 IMPLANT
GOWN STRL REUS W/ TWL XL LVL3 (GOWN DISPOSABLE) ×1 IMPLANT
GOWN STRL REUS W/TWL LRG LVL3 (GOWN DISPOSABLE) ×2
GOWN STRL REUS W/TWL XL LVL3 (GOWN DISPOSABLE) ×1
K-WIRE 2.0 (WIRE) ×4
K-WIRE FXSTD 280X2XNS SS (WIRE) ×4
KIT BASIN OR (CUSTOM PROCEDURE TRAY) ×2 IMPLANT
KIT TURNOVER KIT B (KITS) ×2 IMPLANT
KWIRE FXSTD 280X2XNS SS (WIRE) IMPLANT
MANIFOLD NEPTUNE II (INSTRUMENTS) ×2 IMPLANT
NEEDLE 22X1 1/2 (OR ONLY) (NEEDLE) IMPLANT
NS IRRIG 1000ML POUR BTL (IV SOLUTION) ×2 IMPLANT
PACK ORTHO EXTREMITY (CUSTOM PROCEDURE TRAY) ×2 IMPLANT
PAD ABD 8X10 STRL (GAUZE/BANDAGES/DRESSINGS) ×2 IMPLANT
PAD ARMBOARD 7.5X6 YLW CONV (MISCELLANEOUS) ×4 IMPLANT
PAD CAST 4YDX4 CTTN HI CHSV (CAST SUPPLIES) IMPLANT
PADDING CAST COTTON 4X4 STRL (CAST SUPPLIES) ×1
PADDING CAST COTTON 6X4 STRL (CAST SUPPLIES) ×3 IMPLANT
PLATE NCB LAT PROX 3H TIB 7H (Plate) ×1 IMPLANT
SCREW NCB 4.0MX30M (Screw) ×1 IMPLANT
SCREW NCB 4.0MX34M (Screw) ×2 IMPLANT
SCREW NCB 4.0MX65M (Screw) ×1 IMPLANT
SCREW NCB 4.0X36MM (Screw) ×1 IMPLANT
SCREW NCB 4.0X75 CORT S/T (Screw) ×1 IMPLANT
SCREW NCB 4X3 4X70 (Screw) ×2 IMPLANT
SCREW PROX ST NCB 4X80 (Screw) ×3 IMPLANT
SPONGE T-LAP 18X18 ~~LOC~~+RFID (SPONGE) ×2 IMPLANT
STAPLER VISISTAT 35W (STAPLE) IMPLANT
STOCKINETTE IMPERVIOUS LG (DRAPES) IMPLANT
STRIP CLOSURE SKIN 1/2X4 (GAUZE/BANDAGES/DRESSINGS) IMPLANT
SUT ETHILON 2 0 PSLX (SUTURE) ×2 IMPLANT
SUT VIC AB 0 CT1 27 (SUTURE) ×1
SUT VIC AB 0 CT1 27XBRD ANBCTR (SUTURE) IMPLANT
SUT VIC AB 2-0 CT1 27 (SUTURE) ×1
SUT VIC AB 2-0 CT1 TAPERPNT 27 (SUTURE) IMPLANT
TOWEL GREEN STERILE (TOWEL DISPOSABLE) ×4 IMPLANT
TOWEL GREEN STERILE FF (TOWEL DISPOSABLE) ×3 IMPLANT
UNDERPAD 30X36 HEAVY ABSORB (UNDERPADS AND DIAPERS) ×2 IMPLANT

## 2020-10-29 NOTE — Progress Notes (Signed)
Pt  seen in room, alert/oriented in no apparent distressPt orientated to room/equipments. Surgical site with ace wrap CDI with immobilizer, and dressing/mepilex to right wrist area/ left elbow. Welcome guide/menu provided with instructions. Pt verbalized understanding of instructions. Hospital valuables policy has been discussed with no complaints.Hospital bed in lowest position with 3 side rails up, call bell/room phone within reach and all wheels locked upon leaving the room.

## 2020-10-29 NOTE — ED Notes (Signed)
Per Vincenza Hews, [facility] should arrive here between 9 and 9:15 to transport pt to [facility].

## 2020-10-29 NOTE — Anesthesia Procedure Notes (Signed)
Procedure Name: Intubation Date/Time: 10/29/2020 2:05 PM Performed by: Lanell Matar, CRNA Pre-anesthesia Checklist: Patient identified, Emergency Drugs available, Suction available and Patient being monitored Patient Re-evaluated:Patient Re-evaluated prior to induction Oxygen Delivery Method: Circle System Utilized Preoxygenation: Pre-oxygenation with 100% oxygen Induction Type: IV induction Ventilation: Mask ventilation without difficulty Laryngoscope Size: Miller and 2 Grade View: Grade I Tube type: Oral Tube size: 7.0 mm Number of attempts: 1 Airway Equipment and Method: Stylet and Oral airway Placement Confirmation: ETT inserted through vocal cords under direct vision, positive ETCO2 and breath sounds checked- equal and bilateral Secured at: 20 cm Tube secured with: Tape Dental Injury: Teeth and Oropharynx as per pre-operative assessment

## 2020-10-29 NOTE — Transfer of Care (Addendum)
Immediate Anesthesia Transfer of Care Note  Patient: CHARNAY NAZARIO  Procedure(s) Performed: EXTERNAL FIXATION LEFT KNEE (Left)  Patient Location: PACU  Anesthesia Type:General  Level of Consciousness: awake, alert  and oriented  Airway & Oxygen Therapy: Patient Spontanous Breathing and Patient connected to nasal cannula oxygen  Post-op Assessment: Report given to RN, Post -op Vital signs reviewed and stable and Patient moving all extremities X 4  Post vital signs: Reviewed and stable  Last Vitals:  Vitals Value Taken Time  BP    Temp    Pulse    Resp 9 10/29/20 1627  SpO2    Vitals shown include unvalidated device data.  Last Pain:  Vitals:   10/29/20 1047  TempSrc:   PainSc: 10-Worst pain ever         Complications: No notable events documented.

## 2020-10-29 NOTE — ED Notes (Signed)
Ice bags changed and repositioned for comfort

## 2020-10-29 NOTE — ED Notes (Signed)
Spoke with Corrie Dandy in Patient Placement for update on bed status. Pt continues to be on very long wait list. Corrie Dandy spoke with Maisie Fus our [facility] and said if no bed becomes available tonight then Maisie Fus would be setting up transport with [facility] to [facility] for surgery by 11:15 AM.

## 2020-10-29 NOTE — Anesthesia Preprocedure Evaluation (Addendum)
Anesthesia Evaluation  Patient identified by MRN, date of birth, ID band Patient awake    Reviewed: Allergy & Precautions, NPO status , Patient's Chart, lab work & pertinent test results  Airway Mallampati: III  TM Distance: >3 FB Neck ROM: Full    Dental  (+) Edentulous Upper, Edentulous Lower   Pulmonary asthma ,    Pulmonary exam normal breath sounds clear to auscultation       Cardiovascular hypertension, + angina + CAD and + Peripheral Vascular Disease  Normal cardiovascular exam Rhythm:Regular Rate:Normal  ECG: SR, rate 75   Neuro/Psych  Headaches, PSYCHIATRIC DISORDERS Anxiety Depression    GI/Hepatic negative GI ROS, (+)     substance abuse  ,   Endo/Other  Morbid obesity  Renal/GU negative Renal ROS     Musculoskeletal  (+) Arthritis , narcotic dependentChronic pain   Abdominal (+) + obese,   Peds  Hematology HLD   Anesthesia Other Findings tibial plateau fracture  Reproductive/Obstetrics                            Anesthesia Physical Anesthesia Plan  ASA: 3  Anesthesia Plan: General   Post-op Pain Management:    Induction: Intravenous  PONV Risk Score and Plan: 3 and Ondansetron, Dexamethasone, Midazolam and Treatment may vary due to age or medical condition  Airway Management Planned: Oral ETT  Additional Equipment:   Intra-op Plan:   Post-operative Plan: Extubation in OR  Informed Consent: I have reviewed the patients History and Physical, chart, labs and discussed the procedure including the risks, benefits and alternatives for the proposed anesthesia with the patient or authorized representative who has indicated his/her understanding and acceptance.       Plan Discussed with: CRNA  Anesthesia Plan Comments:        Anesthesia Quick Evaluation
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
## 2020-10-29 NOTE — ED Notes (Signed)
Lab attempt unsuccessful, phlebotomist called

## 2020-10-29 NOTE — ED Notes (Signed)
Attending notified

## 2020-10-29 NOTE — ED Notes (Signed)
EMTALA and chart reviewed by this RN and all documentation complete. Pt good to transport.

## 2020-10-29 NOTE — Anesthesia Postprocedure Evaluation (Signed)
Anesthesia Post Note  Patient: LAELANI VASKO  Procedure(s) Performed: EXTERNAL FIXATION LEFT KNEE (Left)     Patient location during evaluation: PACU Anesthesia Type: General Level of consciousness: awake Pain management: pain level controlled Vital Signs Assessment: post-procedure vital signs reviewed and stable Respiratory status: spontaneous breathing, nonlabored ventilation, respiratory function stable and patient connected to nasal cannula oxygen Cardiovascular status: blood pressure returned to baseline and stable Postop Assessment: no apparent nausea or vomiting Anesthetic complications: no   No notable events documented.  Last Vitals:  Vitals:   10/29/20 1738 10/29/20 2000  BP: (!) 148/83 (!) 164/99  Pulse: 87   Resp: (!) 8 15  Temp: 36.8 C 36.8 C  SpO2: 92% 98%    Last Pain:  Vitals:   10/29/20 2000  TempSrc: Oral  PainSc: 7                  Falan Hensler P Chantil Bari
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
## 2020-10-29 NOTE — ED Notes (Signed)
Per provider to turn oxygen off, oxygen in pt nose but regulator turned off. This Clinical research associate has not adjusted regulator during this shift
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
## 2020-10-29 NOTE — ED Notes (Signed)
Pt states that she has missed her methadone dose since Friday (20mg  3 times and day) and gabapentin (800mg  bid and 1200mg  hs) and feels like she is going thru withdrawals and is having back spasms. Adjusted pt for comfort and ed provider aware

## 2020-10-30 ENCOUNTER — Ambulatory Visit: Payer: Medicare Other | Admitting: Cardiovascular Disease

## 2020-10-30 ENCOUNTER — Other Ambulatory Visit: Payer: Medicare Other

## 2020-10-30 ENCOUNTER — Encounter (HOSPITAL_COMMUNITY): Payer: Self-pay | Admitting: Orthopedic Surgery

## 2020-10-30 ENCOUNTER — Other Ambulatory Visit: Payer: Self-pay

## 2020-10-30 DIAGNOSIS — G8929 Other chronic pain: Secondary | ICD-10-CM

## 2020-10-30 DIAGNOSIS — I4581 Long QT syndrome: Secondary | ICD-10-CM

## 2020-10-30 DIAGNOSIS — M5440 Lumbago with sciatica, unspecified side: Secondary | ICD-10-CM | POA: Diagnosis not present

## 2020-10-30 DIAGNOSIS — F32A Depression, unspecified: Secondary | ICD-10-CM

## 2020-10-30 DIAGNOSIS — I2510 Atherosclerotic heart disease of native coronary artery without angina pectoris: Secondary | ICD-10-CM

## 2020-10-30 DIAGNOSIS — S82142A Displaced bicondylar fracture of left tibia, initial encounter for closed fracture: Secondary | ICD-10-CM | POA: Diagnosis not present

## 2020-10-30 DIAGNOSIS — E669 Obesity, unspecified: Secondary | ICD-10-CM

## 2020-10-30 DIAGNOSIS — F419 Anxiety disorder, unspecified: Secondary | ICD-10-CM | POA: Diagnosis not present

## 2020-10-30 DIAGNOSIS — W19XXXD Unspecified fall, subsequent encounter: Secondary | ICD-10-CM

## 2020-10-30 DIAGNOSIS — E876 Hypokalemia: Secondary | ICD-10-CM

## 2020-10-30 LAB — COMPREHENSIVE METABOLIC PANEL
ALT: 33 U/L (ref 0–44)
AST: 64 U/L — ABNORMAL HIGH (ref 15–41)
Albumin: 2.6 g/dL — ABNORMAL LOW (ref 3.5–5.0)
Alkaline Phosphatase: 106 U/L (ref 38–126)
Anion gap: 7 (ref 5–15)
BUN: 13 mg/dL (ref 6–20)
CO2: 34 mmol/L — ABNORMAL HIGH (ref 22–32)
Calcium: 8.6 mg/dL — ABNORMAL LOW (ref 8.9–10.3)
Chloride: 98 mmol/L (ref 98–111)
Creatinine, Ser: 0.75 mg/dL (ref 0.44–1.00)
GFR, Estimated: >60 mL/min (ref >60)
Glucose, Bld: 126 mg/dL — ABNORMAL HIGH (ref 70–99)
Potassium: 3.3 mmol/L — ABNORMAL LOW (ref 3.5–5.1)
Sodium: 139 mmol/L (ref 135–145)
Total Bilirubin: 0.7 mg/dL (ref 0.3–1.2)
Total Protein: 5.8 g/dL — ABNORMAL LOW (ref 6.5–8.1)

## 2020-10-30 LAB — CBC WITH DIFFERENTIAL/PLATELET
Abs Immature Granulocytes: 0.13 10*3/uL — ABNORMAL HIGH (ref 0.00–0.07)
Basophils Absolute: 0 10*3/uL (ref 0.0–0.1)
Basophils Relative: 0 %
Eosinophils Absolute: 0 10*3/uL (ref 0.0–0.5)
Eosinophils Relative: 0 %
HCT: 31.5 % — ABNORMAL LOW (ref 36.0–46.0)
Hemoglobin: 10.3 g/dL — ABNORMAL LOW (ref 12.0–15.0)
Immature Granulocytes: 1 %
Lymphocytes Relative: 9 %
Lymphs Abs: 1.1 10*3/uL (ref 0.7–4.0)
MCH: 29.9 pg (ref 26.0–34.0)
MCHC: 32.7 g/dL (ref 30.0–36.0)
MCV: 91.3 fL (ref 80.0–100.0)
Monocytes Absolute: 1.4 10*3/uL — ABNORMAL HIGH (ref 0.1–1.0)
Monocytes Relative: 11 %
Neutro Abs: 9.9 10*3/uL — ABNORMAL HIGH (ref 1.7–7.7)
Neutrophils Relative %: 79 %
Platelets: 178 10*3/uL (ref 150–400)
RBC: 3.45 MIL/uL — ABNORMAL LOW (ref 3.87–5.11)
RDW: 13.2 % (ref 11.5–15.5)
WBC: 12.5 10*3/uL — ABNORMAL HIGH (ref 4.0–10.5)
nRBC: 0 % (ref 0.0–0.2)

## 2020-10-30 LAB — MAGNESIUM: Magnesium: 1.9 mg/dL (ref 1.7–2.4)

## 2020-10-30 LAB — VITAMIN B12: Vitamin B-12: 343 pg/mL (ref 180–914)

## 2020-10-30 LAB — VITAMIN D 25 HYDROXY (VIT D DEFICIENCY, FRACTURES): Vit D, 25-Hydroxy: 27 ng/mL — ABNORMAL LOW (ref 30–100)

## 2020-10-30 LAB — FOLATE: Folate: 11.5 ng/mL (ref >5.9)

## 2020-10-30 LAB — IRON: Iron: 24 ug/dL — ABNORMAL LOW (ref 28–170)

## 2020-10-30 LAB — PHOSPHORUS: Phosphorus: 2 mg/dL — ABNORMAL LOW (ref 2.5–4.6)

## 2020-10-30 MED ORDER — CALCIUM CARBONATE ANTACID 500 MG PO CHEW
1.0000 | CHEWABLE_TABLET | Freq: Three times a day (TID) | ORAL | Status: DC
  Administered 2020-10-30 – 2020-11-05 (×18): 200 mg via ORAL
  Filled 2020-10-30 (×18): qty 1

## 2020-10-30 MED ORDER — KETOROLAC TROMETHAMINE 15 MG/ML IJ SOLN
15.0000 mg | Freq: Three times a day (TID) | INTRAMUSCULAR | Status: AC
  Administered 2020-10-30 – 2020-11-02 (×8): 15 mg via INTRAVENOUS
  Filled 2020-10-30 (×8): qty 1

## 2020-10-30 MED ORDER — POTASSIUM CHLORIDE CRYS ER 20 MEQ PO TBCR
40.0000 meq | EXTENDED_RELEASE_TABLET | Freq: Two times a day (BID) | ORAL | Status: AC
  Administered 2020-10-30 (×2): 40 meq via ORAL
  Filled 2020-10-30 (×2): qty 2

## 2020-10-30 MED ORDER — K PHOS MONO-SOD PHOS DI & MONO 155-852-130 MG PO TABS
500.0000 mg | ORAL_TABLET | Freq: Two times a day (BID) | ORAL | Status: AC
  Administered 2020-10-30 (×2): 500 mg via ORAL
  Filled 2020-10-30 (×2): qty 2

## 2020-10-30 MED ORDER — ASCORBIC ACID 500 MG PO TABS
1000.0000 mg | ORAL_TABLET | Freq: Every day | ORAL | Status: DC
  Administered 2020-10-30 – 2020-11-05 (×7): 1000 mg via ORAL
  Filled 2020-10-30 (×7): qty 2

## 2020-10-30 MED ORDER — HYDROMORPHONE HCL 2 MG PO TABS
2.0000 mg | ORAL_TABLET | ORAL | Status: DC | PRN
  Administered 2020-10-30 – 2020-10-31 (×3): 4 mg via ORAL
  Administered 2020-10-31: 2 mg via ORAL
  Administered 2020-11-01 – 2020-11-03 (×9): 4 mg via ORAL
  Administered 2020-11-04: 2 mg via ORAL
  Administered 2020-11-04 – 2020-11-05 (×7): 4 mg via ORAL
  Filled 2020-10-30 (×7): qty 2
  Filled 2020-10-30: qty 1
  Filled 2020-10-30 (×5): qty 2
  Filled 2020-10-30: qty 1
  Filled 2020-10-30 (×11): qty 2

## 2020-10-30 MED ORDER — MIRTAZAPINE 15 MG PO TBDP
15.0000 mg | ORAL_TABLET | Freq: Every day | ORAL | Status: DC
  Administered 2020-10-31 – 2020-11-04 (×6): 15 mg via ORAL
  Filled 2020-10-30 (×8): qty 1

## 2020-10-30 MED ORDER — ENSURE ENLIVE PO LIQD
237.0000 mL | Freq: Two times a day (BID) | ORAL | Status: DC
  Administered 2020-10-31 – 2020-11-02 (×4): 237 mL via ORAL

## 2020-10-30 MED ORDER — ADULT MULTIVITAMIN W/MINERALS CH
1.0000 | ORAL_TABLET | Freq: Two times a day (BID) | ORAL | Status: DC
  Administered 2020-10-30 – 2020-11-04 (×8): 1 via ORAL
  Filled 2020-10-30 (×13): qty 1

## 2020-10-30 MED ORDER — VITAMIN D 25 MCG (1000 UNIT) PO TABS
2000.0000 [IU] | ORAL_TABLET | Freq: Two times a day (BID) | ORAL | Status: DC
  Administered 2020-10-30 – 2020-11-05 (×13): 2000 [IU] via ORAL
  Filled 2020-10-30 (×13): qty 2
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
## 2020-10-30 NOTE — Progress Notes (Signed)
Orthopedic Tech Progress Note Patient Details:  Cheyenne Gray 1965-12-27 [id number]  Ortho Devices Type of Ortho Device: Bone foam zero knee Ortho Device/Splint Interventions: Ordered   Post Interventions Patient Tolerated: Well Instructions Provided: Care of device  Donald Pore 10/30/2020, 4:50 PM

## 2020-10-30 NOTE — TOC Progression Note (Signed)
Transition of Care [facility]) - Progression Note    Patient Details  Name: Cheyenne Gray MRN: [id number] Date of Birth: [birth year]  [facility]) CM/SW Contact  Ralene Bathe, LCSWA Phone Number: 10/30/2020, 8:51 AM  Clinical Narrative:     TOC following patient for any d/c planning needs once medically stable.  Pending PT/ OT recommendations.  Cleon Gustin, MSW, LCSWA        Expected Discharge Plan and Services                                                 Social Determinants of Health (SDOH) Interventions    Readmission Risk Interventions No flowsheet data found.
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
## 2020-10-31 ENCOUNTER — Encounter (HOSPITAL_COMMUNITY): Payer: Self-pay | Admitting: Internal Medicine

## 2020-10-31 DIAGNOSIS — S82142A Displaced bicondylar fracture of left tibia, initial encounter for closed fracture: Secondary | ICD-10-CM | POA: Diagnosis not present

## 2020-10-31 LAB — PHOSPHORUS: Phosphorus: 3.1 mg/dL (ref 2.5–4.6)

## 2020-10-31 LAB — CBC WITH DIFFERENTIAL/PLATELET
Abs Immature Granulocytes: 0.03 10*3/uL (ref 0.00–0.07)
Basophils Absolute: 0.1 10*3/uL (ref 0.0–0.1)
Basophils Relative: 1 %
Eosinophils Absolute: 0.2 10*3/uL (ref 0.0–0.5)
Eosinophils Relative: 1 %
HCT: 30.5 % — ABNORMAL LOW (ref 36.0–46.0)
Hemoglobin: 9.8 g/dL — ABNORMAL LOW (ref 12.0–15.0)
Immature Granulocytes: 0 %
Lymphocytes Relative: 23 %
Lymphs Abs: 2.4 10*3/uL (ref 0.7–4.0)
MCH: 29.8 pg (ref 26.0–34.0)
MCHC: 32.1 g/dL (ref 30.0–36.0)
MCV: 92.7 fL (ref 80.0–100.0)
Monocytes Absolute: 1.1 10*3/uL — ABNORMAL HIGH (ref 0.1–1.0)
Monocytes Relative: 11 %
Neutro Abs: 6.6 10*3/uL (ref 1.7–7.7)
Neutrophils Relative %: 64 %
Platelets: 177 10*3/uL (ref 150–400)
RBC: 3.29 MIL/uL — ABNORMAL LOW (ref 3.87–5.11)
RDW: 13.3 % (ref 11.5–15.5)
WBC: 10.4 10*3/uL (ref 4.0–10.5)
nRBC: 0 % (ref 0.0–0.2)

## 2020-10-31 LAB — COMPREHENSIVE METABOLIC PANEL
ALT: 32 U/L (ref 0–44)
AST: 107 U/L — ABNORMAL HIGH (ref 15–41)
Albumin: 2.5 g/dL — ABNORMAL LOW (ref 3.5–5.0)
Alkaline Phosphatase: 110 U/L (ref 38–126)
Anion gap: 8 (ref 5–15)
BUN: 17 mg/dL (ref 6–20)
CO2: 32 mmol/L (ref 22–32)
Calcium: 8.2 mg/dL — ABNORMAL LOW (ref 8.9–10.3)
Chloride: 99 mmol/L (ref 98–111)
Creatinine, Ser: 0.85 mg/dL (ref 0.44–1.00)
GFR, Estimated: >60 mL/min (ref >60)
Glucose, Bld: 85 mg/dL (ref 70–99)
Potassium: 3.6 mmol/L (ref 3.5–5.1)
Sodium: 139 mmol/L (ref 135–145)
Total Bilirubin: 0.7 mg/dL (ref 0.3–1.2)
Total Protein: 5.3 g/dL — ABNORMAL LOW (ref 6.5–8.1)

## 2020-10-31 LAB — MAGNESIUM: Magnesium: 1.9 mg/dL (ref 1.7–2.4)

## 2020-10-31 LAB — FERRITIN: Ferritin: 89 ng/mL (ref 11–307)

## 2020-10-31 MED ORDER — FERROUS SULFATE 325 (65 FE) MG PO TABS
325.0000 mg | ORAL_TABLET | ORAL | Status: DC
  Administered 2020-11-01: 325 mg via ORAL
  Filled 2020-10-31: qty 1

## 2020-10-31 MED ORDER — FERROUS SULFATE 325 (65 FE) MG PO TABS: 325.0000 mg | ORAL_TABLET | Freq: Two times a day (BID) | ORAL | Status: DC

## 2020-10-31 MED ORDER — FERROUS SULFATE 325 (65 FE) MG PO TABS: 325.0000 mg | ORAL_TABLET | Freq: Every day | ORAL | Status: DC

## 2020-10-31 MED ORDER — POTASSIUM CHLORIDE CRYS ER 20 MEQ PO TBCR
40.0000 meq | EXTENDED_RELEASE_TABLET | Freq: Once | ORAL | Status: AC
  Administered 2020-10-31: 40 meq via ORAL
  Filled 2020-10-31: qty 2
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
## 2020-10-31 NOTE — Care Management Important Message (Signed)
Important Message  Patient Details  Name: Cheyenne Gray MRN: [id number] Date of Birth: 27-Aug-1965   Medicare Important Message Given:  Yes     Dorena Bodo 10/31/2020, 3:13 PM
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
## 2020-11-01 ENCOUNTER — Ambulatory Visit: Payer: Medicare Other | Admitting: Oncology

## 2020-11-01 DIAGNOSIS — S82142A Displaced bicondylar fracture of left tibia, initial encounter for closed fracture: Secondary | ICD-10-CM | POA: Diagnosis not present

## 2020-11-01 LAB — ZINC: Zinc: 48 ug/dL (ref 44–115)

## 2020-11-01 LAB — COPPER, SERUM: Copper: 154 ug/dL (ref 80–158)

## 2020-11-01 MED ORDER — APIXABAN 2.5 MG PO TABS
2.5000 mg | ORAL_TABLET | Freq: Two times a day (BID) | ORAL | Status: DC
  Administered 2020-11-02 – 2020-11-05 (×7): 2.5 mg via ORAL
  Filled 2020-11-01 (×7): qty 1
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
## 2020-11-01 NOTE — Discharge Instructions (Addendum)
Information on my medicine - ELIQUIS (apixaban)  This medication education was reviewed with me or my healthcare representative as part of my discharge preparation.   Why was Eliquis prescribed for you? Eliquis was prescribed for you to reduce the risk of blood clots forming after orthopedic surgery.    What do You need to know about Eliquis? Take your Eliquis TWICE DAILY - one tablet in the morning and one tablet in the evening with or without food.  It would be best to take the dose about the same time each day.  If you have difficulty swallowing the tablet whole please discuss with your pharmacist how to take the medication safely.  Take Eliquis exactly as prescribed by your doctor and DO NOT stop taking Eliquis without talking to the doctor who prescribed the medication.  Stopping without other medication to take the place of Eliquis may increase your risk of developing a clot.  After discharge, you should have regular check-up appointments with your healthcare provider that is prescribing your Eliquis.  What do you do if you miss a dose? If a dose of ELIQUIS is not taken at the scheduled time, take it as soon as possible on the same day and twice-daily administration should be resumed.  The dose should not be doubled to make up for a missed dose.  Do not take more than one tablet of ELIQUIS at the same time.  Important Safety Information A possible side effect of Eliquis is bleeding. You should call your healthcare provider right away if you experience any of the following: Bleeding from an injury or your nose that does not stop. Unusual colored urine (red or dark brown) or unusual colored stools (red or black). Unusual bruising for unknown reasons. A serious fall or if you hit your head (even if there is no bleeding).  Some medicines may interact with Eliquis and might increase your risk of bleeding or clotting while on Eliquis. To help avoid this, consult your healthcare  provider or pharmacist prior to using any new prescription or non-prescription medications, including herbals, vitamins, non-steroidal anti-inflammatory drugs (NSAIDs) and supplements.  This website has more information on Eliquis (apixaban): [email or web address].[email or web address]     Orthopaedic Trauma Service Discharge Instructions   General Discharge Instructions  Orthopaedic Injuries:  Left tibial plateau fracture treated with open reduction and internal fixation using plate and screws   WEIGHT BEARING STATUS: Nonweightbearing Left leg   RANGE OF MOTION/ACTIVITY: unrestricted range of motion left knee and ankle.  Activity as tolerated while maintaining weightbearing restrictions   Bone health: labs show vitamin d insufficiency. Recommend vitamin d 3 5000 IUs daily   Wound Care: daily wound care as needed. Ok to leave wounds open up to the air once they are dry   Discharge Wound Care Instructions  Do NOT apply any ointments, solutions or lotions to pin sites or surgical wounds.  These prevent needed drainage and even though solutions like hydrogen peroxide kill bacteria, they also damage cells lining the pin sites that help fight infection.  Applying lotions or ointments can keep the wounds moist and can cause them to breakdown and open up as well. This can increase the risk for infection. When in doubt call the office.  Surgical incisions should be dressed daily.  If any drainage is noted, use one layer of adaptic, then gauze, Kerlix, and an ace wrap.  Once the incision is completely dry and without drainage, it may be left open to air out.  Showering may  begin 36-48 hours later.  Cleaning gently with soap and water.  Traumatic wounds should be dressed daily as well.    One layer of adaptic, gauze, Kerlix, then ace wrap.  The adaptic can be discontinued once the draining has ceased    If you have a wet to dry dressing: wet the gauze with saline the squeeze as much saline out so  the gauze is moist (not soaking wet), place moistened gauze over wound, then place a dry gauze over the moist one, followed by Kerlix wrap, then ace wrap.    DVT/PE prophylaxis: eiliquis 2.5 mg every 12 hours x 4 weeks   Diet: as you were eating previously.  Can use over the counter stool softeners and bowel preparations, such as Miralax, to help with bowel movements.  Narcotics can be constipating.  Be sure to drink plenty of fluids  PAIN MEDICATION USE AND EXPECTATIONS  You have likely been given narcotic medications to help control your pain.  After a traumatic event that results in an fracture (broken bone) with or without surgery, it is ok to use narcotic pain medications to help control one's pain.  We understand that everyone responds to pain differently and each individual patient will be evaluated on a regular basis for the continued need for narcotic medications. Ideally, narcotic medication use should last no more than 6-8 weeks (coinciding with fracture healing).   As a patient it is your responsibility as well to monitor narcotic medication use and report the amount and frequency you use these medications when you come to your office visit.   We would also advise that if you are using narcotic medications, you should take a dose prior to therapy to maximize you participation.  IF YOU ARE ON NARCOTIC MEDICATIONS IT IS NOT PERMISSIBLE TO OPERATE A MOTOR VEHICLE (MOTORCYCLE/CAR/TRUCK/MOPED) OR HEAVY MACHINERY DO NOT MIX NARCOTICS WITH OTHER CNS (CENTRAL NERVOUS SYSTEM) DEPRESSANTS SUCH AS ALCOHOL   POST-OPERATIVE OPIOID TAPER INSTRUCTIONS: It is important to wean off of your opioid medication as soon as possible. If you do not need pain medication after your surgery it is ok to stop day one. Opioids include: Codeine, Hydrocodone(Norco, Vicodin), Oxycodone(Percocet, oxycontin) and hydromorphone amongst others.  Long term and even short term use of opiods can cause: Increased pain  response Dependence Constipation Depression Respiratory depression And more.  Withdrawal symptoms can include Flu like symptoms Nausea, vomiting And more Techniques to manage these symptoms Hydrate well Eat regular healthy meals Stay active Use relaxation techniques(deep breathing, meditating, yoga) Do Not substitute Alcohol to help with tapering If you have been on opioids for less than two weeks and do not have pain than it is ok to stop all together.  Plan to wean off of opioids This plan should start within one week post op of your fracture surgery  Maintain the same interval or time between taking each dose and first decrease the dose.  Cut the total daily intake of opioids by one tablet each day Next start to increase the time between doses. The last dose that should be eliminated is the evening dose.    STOP SMOKING OR USING NICOTINE PRODUCTS!!!!  As discussed nicotine severely impairs your body's ability to heal surgical and traumatic wounds but also impairs bone healing.  Wounds and bone heal by forming microscopic blood vessels (angiogenesis) and nicotine is a vasoconstrictor (essentially, shrinks blood vessels).  Therefore, if vasoconstriction occurs to these microscopic blood vessels they essentially disappear and are unable to deliver  necessary nutrients to the healing tissue.  This is one modifiable factor that you can do to dramatically increase your chances of healing your injury.    (This means no smoking, no nicotine gum, patches, etc)  DO NOT USE NONSTEROIDAL ANTI-INFLAMMATORY DRUGS (NSAID'S)  Using products such as Advil (ibuprofen), Aleve (naproxen), Motrin (ibuprofen) for additional pain control during fracture healing can delay and/or prevent the healing response.  If you would like to take over the counter (OTC) medication, Tylenol (acetaminophen) is ok.  However, some narcotic medications that are given for pain control contain acetaminophen as well. Therefore,  you should not exceed more than 4000 mg of tylenol in a day if you do not have liver disease.  Also note that there are may OTC medicines, such as cold medicines and allergy medicines that my contain tylenol as well.  If you have any questions about medications and/or interactions please ask your doctor/PA or your pharmacist.      ICE AND ELEVATE INJURED/OPERATIVE EXTREMITY  Using ice and elevating the injured extremity above your heart can help with swelling and pain control.  Icing in a pulsatile fashion, such as 20 minutes on and 20 minutes off, can be followed.    Do not place ice directly on skin. Make sure there is a barrier between to skin and the ice pack.    Using frozen items such as frozen peas works well as the conform nicely to the are that needs to be iced.  USE AN ACE WRAP OR TED HOSE FOR SWELLING CONTROL  In addition to icing and elevation, Ace wraps or TED hose are used to help limit and resolve swelling.  It is recommended to use Ace wraps or TED hose until you are informed to stop.    When using Ace Wraps start the wrapping distally (farthest away from the body) and wrap proximally (closer to the body)   Example: If you had surgery on your leg or thing and you do not have a splint on, start the ace wrap at the toes and work your way up to the thigh        If you had surgery on your upper extremity and do not have a splint on, start the ace wrap at your fingers and work your way up to the upper arm  IF YOU ARE IN A SPLINT OR CAST DO NOT REMOVE IT FOR ANY REASON   If your splint gets wet for any reason please contact the office immediately. You may shower in your splint or cast as long as you keep it dry.  This can be done by wrapping in a cast cover or garbage back (or similar)  Do Not stick any thing down your splint or cast such as pencils, money, or hangers to try and scratch yourself with.  If you feel itchy take benadryl as prescribed on the bottle for itching  IF YOU ARE IN  A CAM BOOT (BLACK BOOT)  You may remove boot periodically. Perform daily dressing changes as noted below.  Wash the liner of the boot regularly and wear a sock when wearing the boot. It is recommended that you sleep in the boot until told otherwise    Call office for the following: Temperature greater than 101F Persistent nausea and vomiting Severe uncontrolled pain Redness, tenderness, or signs of infection (pain, swelling, redness, odor or green/yellow discharge around the site) Difficulty breathing, headache or visual disturbances Hives Persistent dizziness or light-headedness Extreme fatigue Any other  questions or concerns you may have after discharge  In an emergency, call 911 or go to an Emergency Department at a nearby hospital  HELPFUL INFORMATION  If you had a block, it will wear off between 8-24 hrs postop typically.  This is period when your pain may go from nearly zero to the pain you would have had postop without the block.  This is an abrupt transition but nothing dangerous is happening.  You may take an extra dose of narcotic when this happens.  You should wean off your narcotic medicines as soon as you are able.  Most patients will be off or using minimal narcotics before their first postop appointment.   We suggest you use the pain medication the first night prior to going to bed, in order to ease any pain when the anesthesia wears off. You should avoid taking pain medications on an empty stomach as it will make you nauseous.  Do not drink alcoholic beverages or take illicit drugs when taking pain medications.  In most states it is against the law to drive while you are in a splint or sling.  And certainly against the law to drive while taking narcotics.  You may return to work/school in the next couple of days when you feel up to it.   Pain medication may make you constipated.  Below are a few solutions to try in this order: Decrease the amount of pain medication if  you aren't having pain. Drink lots of decaffeinated fluids. Drink prune juice and/or each dried prunes  If the first 3 don't work start with additional solutions Take Colace - an over-the-counter stool softener Take Senokot - an over-the-counter laxative Take Miralax - a stronger over-the-counter laxative     CALL THE OFFICE WITH ANY QUESTIONS OR CONCERNS: [phone]   VISIT OUR WEBSITE FOR ADDITIONAL INFORMATION: [email or web address]
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
## 2020-11-01 NOTE — Progress Notes (Signed)
PT Cancellation Note  Patient Details Name: Cheyenne Gray MRN: [id number] DOB: [birth year]   Cancelled Treatment:    Reason Eval/Treat Not Completed: Other (comment).  Refusing therapy today due to pain and fatigue from AM activity, and will retry as time and pt allow.   Ivar Drape 11/01/2020, 2:09 PM  Samul Dada, PT MS Acute Rehab Dept. Number: [facility] [id number] and [facility] [phone]
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
## 2020-11-01 NOTE — Plan of Care (Signed)

## 2020-11-02 DIAGNOSIS — S82142A Displaced bicondylar fracture of left tibia, initial encounter for closed fracture: Secondary | ICD-10-CM | POA: Diagnosis not present

## 2020-11-02 LAB — CBC
HCT: 29.6 % — ABNORMAL LOW (ref 36.0–46.0)
Hemoglobin: 9.4 g/dL — ABNORMAL LOW (ref 12.0–15.0)
MCH: 29.7 pg (ref 26.0–34.0)
MCHC: 31.8 g/dL (ref 30.0–36.0)
MCV: 93.7 fL (ref 80.0–100.0)
Platelets: 203 10*3/uL (ref 150–400)
RBC: 3.16 MIL/uL — ABNORMAL LOW (ref 3.87–5.11)
RDW: 13.6 % (ref 11.5–15.5)
WBC: 8.8 10*3/uL (ref 4.0–10.5)
nRBC: 0 % (ref 0.0–0.2)

## 2020-11-02 LAB — BASIC METABOLIC PANEL
Anion gap: 5 (ref 5–15)
BUN: 11 mg/dL (ref 6–20)
CO2: 32 mmol/L (ref 22–32)
Calcium: 8.5 mg/dL — ABNORMAL LOW (ref 8.9–10.3)
Chloride: 102 mmol/L (ref 98–111)
Creatinine, Ser: 0.8 mg/dL (ref 0.44–1.00)
GFR, Estimated: >60 mL/min (ref >60)
Glucose, Bld: 94 mg/dL (ref 70–99)
Potassium: 4.2 mmol/L (ref 3.5–5.1)
Sodium: 139 mmol/L (ref 135–145)

## 2020-11-02 LAB — VITAMIN B1: Vitamin B1 (Thiamine): 156.4 nmol/L (ref 66.5–200.0)

## 2020-11-02 LAB — MAGNESIUM: Magnesium: 1.9 mg/dL (ref 1.7–2.4)
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
## 2020-11-02 NOTE — TOC Progression Note (Signed)
Transition of Care [facility]) - Progression Note    Patient Details  Name: Cheyenne Gray MRN: [id number] Date of Birth: [birth year]  Transition of Care [facility]) CM/SW Contact  Epifanio Lesches, RN Phone Number: 11/02/2020, 10:37 AM  Clinical Narrative:        -Left bicondylar tibial plateau fracture s/p ORIF From home alone. Pt declined SNF Expected Discharge Plan: Home w Home Health Services (declined SNF placement) Barriers to Discharge: Continued Medical Work up  Expected Discharge Plan and Services Expected Discharge Plan: Home w Home Health Services (declined SNF placement)   Discharge Planning Services: CM Consult   Living arrangements for the past 2 months: Apartment                 DME Arranged: 3-N-1, Shower stool, Walker rolling, Wheelchair manual   Date DME Agency Contacted: 11/02/20 Time DME Agency Contacted: 1028 Representative spoke with at DME Agency: Velna Hatchet HH Arranged: RN, PT, OT, Nurse's Aide HH Agency: [facility] Date [facility] Agency Contacted: 11/02/20 Time HH Agency Contacted: 1027 Representative spoke with at [facility] Agency: Elnita Maxwell   Social Determinants of Health (SDOH) Interventions    Readmission Risk Interventions No flowsheet data found.
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
## 2020-11-03 DIAGNOSIS — S82142A Displaced bicondylar fracture of left tibia, initial encounter for closed fracture: Secondary | ICD-10-CM | POA: Diagnosis not present

## 2020-11-03 LAB — VITAMIN E
Vitamin E (Alpha Tocopherol): 7.3 mg/L (ref 7.0–25.1)
Vitamin E(Gamma Tocopherol): 1 mg/L (ref 0.5–5.5)

## 2020-11-03 LAB — VITAMIN A: Vitamin A (Retinoic Acid): 21.7 ug/dL (ref 20.1–62.0)
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
## 2020-11-03 NOTE — Plan of Care (Signed)
  Problem: Pain Managment: Goal: General experience of comfort will improve Outcome: Progressing   Problem: Safety: Goal: Ability to remain free from injury will improve Outcome: Progressing   Problem: Skin Integrity: Goal: Risk for impaired skin integrity will decrease Outcome: Progressing   
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
## 2020-11-04 DIAGNOSIS — S82142A Displaced bicondylar fracture of left tibia, initial encounter for closed fracture: Secondary | ICD-10-CM | POA: Diagnosis not present

## 2020-11-04 LAB — CBC
HCT: 30.8 % — ABNORMAL LOW (ref 36.0–46.0)
Hemoglobin: 9.8 g/dL — ABNORMAL LOW (ref 12.0–15.0)
MCH: 30.1 pg (ref 26.0–34.0)
MCHC: 31.8 g/dL (ref 30.0–36.0)
MCV: 94.5 fL (ref 80.0–100.0)
Platelets: 244 10*3/uL (ref 150–400)
RBC: 3.26 MIL/uL — ABNORMAL LOW (ref 3.87–5.11)
RDW: 14.1 % (ref 11.5–15.5)
WBC: 10.3 10*3/uL (ref 4.0–10.5)
nRBC: 0 % (ref 0.0–0.2)

## 2020-11-04 LAB — URINALYSIS, ROUTINE W REFLEX MICROSCOPIC
Bilirubin Urine: NEGATIVE
Glucose, UA: NEGATIVE mg/dL
Hgb urine dipstick: NEGATIVE
Ketones, ur: NEGATIVE mg/dL
Leukocytes,Ua: NEGATIVE
Nitrite: NEGATIVE
Protein, ur: NEGATIVE mg/dL
Specific Gravity, Urine: 1.008 (ref 1.005–1.030)
pH: 7 (ref 5.0–8.0)

## 2020-11-04 NOTE — Plan of Care (Signed)
  Problem: Education: Goal: Knowledge of General Education information will improve Description: Including pain rating scale, medication(s)/side effects and non-pharmacologic comfort measures Outcome: Progressing   Problem: Pain Managment: Goal: General experience of comfort will improve Outcome: Progressing   
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
## 2020-11-04 NOTE — Plan of Care (Signed)
  Problem: Pain Managment: Goal: General experience of comfort will improve Outcome: Progressing   Problem: Safety: Goal: Ability to remain free from injury will improve Outcome: Progressing   
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
## 2020-11-05 ENCOUNTER — Encounter: Payer: Self-pay | Admitting: Oncology

## 2020-11-05 ENCOUNTER — Other Ambulatory Visit (HOSPITAL_COMMUNITY): Payer: Self-pay

## 2020-11-05 ENCOUNTER — Inpatient Hospital Stay (HOSPITAL_COMMUNITY): Payer: Medicare Other

## 2020-11-05 DIAGNOSIS — S82142A Displaced bicondylar fracture of left tibia, initial encounter for closed fracture: Secondary | ICD-10-CM | POA: Diagnosis not present

## 2020-11-05 LAB — URINE CULTURE: Culture: NO GROWTH

## 2020-11-05 LAB — CREATININE, SERUM
Creatinine, Ser: 0.98 mg/dL (ref 0.44–1.00)
GFR, Estimated: >60 mL/min (ref >60)

## 2020-11-05 MED ORDER — CHOLECALCIFEROL 125 MCG (5000 UT) PO TABS
ORAL_TABLET | Freq: Every day | ORAL | 3 refills | Status: DC
  Filled 2020-11-05: qty 30, 30d supply, fill #0

## 2020-11-05 MED ORDER — APIXABAN 2.5 MG PO TABS
2.5000 mg | ORAL_TABLET | Freq: Two times a day (BID) | ORAL | 0 refills | Status: DC
  Filled 2020-11-05: qty 60, 30d supply, fill #0

## 2020-11-05 MED ORDER — NALOXONE HCL 4 MG/0.1ML NA LIQD
NASAL | 1 refills | Status: DC
  Filled 2020-11-05: qty 2, 1d supply, fill #0

## 2020-11-05 MED ORDER — HYDROMORPHONE HCL 4 MG PO TABS
4.0000 mg | ORAL_TABLET | Freq: Four times a day (QID) | ORAL | 0 refills | Status: DC | PRN
  Filled 2020-11-05: qty 28, 7d supply, fill #0

## 2020-11-05 MED ORDER — DOCUSATE SODIUM 100 MG PO CAPS
100.0000 mg | ORAL_CAPSULE | Freq: Two times a day (BID) | ORAL | 0 refills | Status: DC
  Filled 2020-11-05: qty 30, 15d supply, fill #0

## 2020-11-05 MED ORDER — GABAPENTIN 400 MG PO CAPS: 800.0000 mg | ORAL_CAPSULE | Freq: Three times a day (TID) | ORAL | Status: DC

## 2020-11-05 MED ORDER — ASCORBIC ACID 1000 MG PO TABS
1000.0000 mg | ORAL_TABLET | Freq: Every day | ORAL | 1 refills | Status: DC
  Filled 2020-11-05: qty 30, 30d supply, fill #0

## 2020-11-05 MED ORDER — ACETAMINOPHEN 500 MG PO TABS
1000.0000 mg | ORAL_TABLET | Freq: Three times a day (TID) | ORAL | 0 refills | Status: DC
  Filled 2020-11-05: qty 90, 15d supply, fill #0
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
## 2020-11-05 NOTE — Progress Notes (Signed)
Discharge summary packet provided to pt with instructions. Pt verbalized understanding of instructions. Wound care management discuused with no complaints voiced. Pt d/c to home with [facility] as ordered. [facility] to provide transportationfor pt to home. Pt remains alert/oriented in no apparent distress.
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
## 2020-11-05 NOTE — Discharge Summary (Signed)
Physician Discharge Summary  Cheyenne Gray DCV:[id number] DOB: 1965/11/12 DOA: 10/29/2020  PCP: McLean-Scocuzza, Nino Glow, MD  Admit date: 10/29/2020 Discharge date: 11/05/2020  Discharge disposition: Home with home health therapy   Recommendations for Outpatient Follow-Up:   Follow-up with PCP in 1 to 2 weeks. Follow-up with orthopedic surgeon in 2 weeks   Discharge Diagnosis:   Principal Problem:   Closed fracture of left tibial plateau Active Problems:   Long Q-T syndrome   CAD (coronary artery disease)   Anxiety and depression   Hypokalemia   Obesity (BMI 30-39.9)   Chronic midline low back pain with sciatica   Hypomagnesemia   Fall    Discharge Condition: Stable.  Diet recommendation:  Diet Order             Diet - low sodium heart healthy           Diet regular Room service appropriate? Yes with Assist; Fluid consistency: Thin  Diet effective now                     Code Status: Full Code     Hospital Course:   [name] is a 55 y.o. female  a known history of HFpEF, coronary artery disease, hypertension, depression and anxiety, asthma, long QT interval, chronic low back pain, on methadone and HOCM, who presented to the ER with acute onset of left knee pain after having an accidental mechanical fall.   She was found to have left comminuted tibial plateau fracture.  She underwent ORIF of left tibial plateau fracture on 10/29/2020.  She was treated with analgesics.  She had hypokalemia, hypomagnesemia and hypophosphatemia that were repleted.  She was evaluated by PT and OT who recommended further rehabilitation with home health therapy.  Her condition has improved and she is deemed stable for discharge to home today.      Medical Consultants:   Orthopedic surgeon   Discharge Exam:    Vitals:   11/04/20 0811 11/04/20 1512 11/04/20 2135 11/05/20 0731  BP: (!) 141/71 107/61 125/72 101/65  Pulse: 70 73 73 65  Resp: 20 20 17 14    Temp: 98.2 F (36.8 C) 98.1 F (36.7 C) 98.1 F (36.7 C) 98.6 F (37 C)  TempSrc:   Oral Oral  SpO2: 96% 95% 100% 93%  Weight:      Height:         GEN: NAD SKIN: Warm and dry EYES: EOMI ENT: MMM CV: RRR PULM: CTA B ABD: soft, obese, NT, +BS CNS: AAO x 3, non focal EXT: Left leg swelling and tenderness   The results of significant diagnostics from this hospitalization (including imaging, microbiology, ancillary and laboratory) are listed below for reference.     Procedures and Diagnostic Studies:   DG Chest Portable 1 View  Result Date: 10/29/2020 CLINICAL DATA:  Preoperative evaluation. EXAM: PORTABLE CHEST 1 VIEW COMPARISON:  June 14, 2020 FINDINGS: The heart size and mediastinal contours are within normal limits. Both lungs are clear. Radiopaque surgical clips are seen overlying the bilateral upper quadrants. The visualized skeletal structures are unremarkable. IMPRESSION: No active cardiopulmonary disease. Electronically Signed   By: Virgina Norfolk M.D.   On: 10/29/2020 04:08   DG Knee Complete 4 Views Left  Result Date: 10/29/2020 CLINICAL DATA:  ORIF left tibia. EXAM: LEFT KNEE - COMPLETE 4+ VIEW; DG C-ARM 1-60 MIN COMPARISON:  Preoperative imaging. FINDINGS: Seven fluoroscopic spot views of the left knee obtained in the  operating room in frontal and lateral projections. Lateral plate and multi screw fixation of proximal tibial fracture. Proximal fibular fracture is in unchanged alignment. Fluoroscopy time 68.7 seconds. IMPRESSION: Intraoperative fluoroscopy during left tibia fracture ORIF. Electronically Signed   By: Keith Rake M.D.   On: 10/29/2020 17:03   DG Knee Left Port  Result Date: 10/29/2020 CLINICAL DATA:  Fracture, postop. EXAM: PORTABLE LEFT KNEE - 1-2 VIEW COMPARISON:  Preoperative imaging. FINDINGS: Lateral plate and multi screw fixation of proximal tibial fracture. Improved fracture alignment. Proximal fibular fracture is faintly visualized,  partially obscured by overlying artifact. Overlying brace in place. Soft tissue edema is seen. IMPRESSION: ORIF of proximal tibial fracture, in improved alignment. No immediate postoperative complication. Electronically Signed   By: Keith Rake M.D.   On: 10/29/2020 18:06   DG C-Arm 1-60 Min  Result Date: 10/29/2020 CLINICAL DATA:  ORIF left tibia. EXAM: LEFT KNEE - COMPLETE 4+ VIEW; DG C-ARM 1-60 MIN COMPARISON:  Preoperative imaging. FINDINGS: Seven fluoroscopic spot views of the left knee obtained in the operating room in frontal and lateral projections. Lateral plate and multi screw fixation of proximal tibial fracture. Proximal fibular fracture is in unchanged alignment. Fluoroscopy time 68.7 seconds. IMPRESSION: Intraoperative fluoroscopy during left tibia fracture ORIF. Electronically Signed   By: Keith Rake M.D.   On: 10/29/2020 17:03     Labs:   Basic Metabolic Panel: Recent Labs  Lab 10/29/20 1155 10/29/20 1912 10/30/20 0707 10/31/20 0243 11/02/20 0042 11/05/20 0158  NA 140  --  139 139 139  --   K 3.0*  --  3.3* 3.6 4.2  --   CL 96*  --  98 99 102  --   CO2  --   --  34* 32 32  --   GLUCOSE 114*  --  126* 85 94  --   BUN 10  --  13 17 11   --   CREATININE 0.60  --  0.75 0.85 0.80 0.98  CALCIUM  --   --  8.6* 8.2* 8.5*  --   MG  --  1.9 1.9 1.9 1.9  --   PHOS  --   --  2.0* 3.1  --   --    GFR Estimated Creatinine Clearance: 83.1 mL/min (by C-G formula based on SCr of 0.98 mg/dL). Liver Function Tests: Recent Labs  Lab 10/30/20 0707 10/31/20 0243  AST 64* 107*  ALT 33 32  ALKPHOS 106 110  BILITOT 0.7 0.7  PROT 5.8* 5.3*  ALBUMIN 2.6* 2.5*   No results for input(s): LIPASE, AMYLASE in the last 168 hours. No results for input(s): AMMONIA in the last 168 hours. Coagulation profile No results for input(s): INR, PROTIME in the last 168 hours.  CBC: Recent Labs  Lab 10/29/20 1155 10/30/20 0707 10/31/20 0243 11/02/20 0042 11/04/20 0229  WBC  --   12.5* 10.4 8.8 10.3  NEUTROABS  --  9.9* 6.6  --   --   HGB 12.6 10.3* 9.8* 9.4* 9.8*  HCT 37.0 31.5* 30.5* 29.6* 30.8*  MCV  --  91.3 92.7 93.7 94.5  PLT  --  178 177 203 244   Cardiac Enzymes: No results for input(s): CKTOTAL, CKMB, CKMBINDEX, TROPONINI in the last 168 hours. BNP: Invalid input(s): POCBNP CBG: No results for input(s): GLUCAP in the last 168 hours. D-Dimer No results for input(s): DDIMER in the last 72 hours. Hgb A1c No results for input(s): HGBA1C in the last 72 hours. Lipid Profile No  results for input(s): CHOL, HDL, LDLCALC, TRIG, CHOLHDL, LDLDIRECT in the last 72 hours. Thyroid function studies No results for input(s): TSH, T4TOTAL, T3FREE, THYROIDAB in the last 72 hours.  Invalid input(s): FREET3 Anemia work up No results for input(s): VITAMINB12, FOLATE, FERRITIN, TIBC, IRON, RETICCTPCT in the last 72 hours. Microbiology Recent Results (from the past 240 hour(s))  Resp Panel by RT-PCR (Flu A&B, Covid) Nasopharyngeal Swab     Status: None   Collection Time: 10/27/20 11:37 PM   Specimen: Nasopharyngeal Swab; Nasopharyngeal(NP) swabs in vial transport medium  Result Value Ref Range Status   SARS Coronavirus 2 by RT PCR NEGATIVE NEGATIVE Final    Comment: (NOTE) SARS-CoV-2 target nucleic acids are NOT DETECTED.  The SARS-CoV-2 RNA is generally detectable in upper respiratory specimens during the acute phase of infection. The lowest concentration of SARS-CoV-2 viral copies this assay can detect is 138 copies/mL. A negative result does not preclude SARS-Cov-2 infection and should not be used as the sole basis for treatment or other patient management decisions. A negative result may occur with  improper specimen collection/handling, submission of specimen other than nasopharyngeal swab, presence of viral mutation(s) within the areas targeted by this assay, and inadequate number of viral copies(<138 copies/mL). A negative result must be combined  with clinical observations, patient history, and epidemiological information. The expected result is Negative.  Fact Sheet for Patients:  EntrepreneurPulse.com.au  Fact Sheet for Healthcare Providers:  IncredibleEmployment.be  This test is no t yet approved or cleared by the [place] FDA and  has been authorized for detection and/or diagnosis of SARS-CoV-2 by FDA under an Emergency Use Authorization (EUA). This EUA will remain  in effect (meaning this test can be used) for the duration of the COVID-19 declaration under Section 564(b)(1) of the Act, 21 U.S.C.section 360bbb-3(b)(1), unless the authorization is terminated  or revoked sooner.       Influenza A by PCR NEGATIVE NEGATIVE Final   Influenza B by PCR NEGATIVE NEGATIVE Final    Comment: (NOTE) The Xpert Xpress SARS-CoV-2/FLU/RSV plus assay is intended as an aid in the diagnosis of influenza from Nasopharyngeal swab specimens and should not be used as a sole basis for treatment. Nasal washings and aspirates are unacceptable for Xpert Xpress SARS-CoV-2/FLU/RSV testing.  Fact Sheet for Patients: EntrepreneurPulse.com.au  Fact Sheet for Healthcare Providers: IncredibleEmployment.be  This test is not yet approved or cleared by the [place] FDA and has been authorized for detection and/or diagnosis of SARS-CoV-2 by FDA under an Emergency Use Authorization (EUA). This EUA will remain in effect (meaning this test can be used) for the duration of the COVID-19 declaration under Section 564(b)(1) of the Act, 21 U.S.C. section 360bbb-3(b)(1), unless the authorization is terminated or revoked.  Performed at [facility], [address] [place], [place] [address]      Discharge Instructions:   Discharge Instructions     Diet - low sodium heart healthy   Complete by: As directed    Discharge wound care:   Complete by: As directed     Follow up with orthopedic surgeon for surgical wound care   Face-to-face encounter (required for Medicare/Medicaid patients)   Complete by: As directed    I Mayrene Bastarache certify that this patient is under my care and that I, or a nurse practitioner or physician's assistant working with me, had a face-to-face encounter that meets the physician face-to-face encounter requirements with this patient on 11/05/2020. The encounter with the patient was in whole,  or in part for the following medical condition(s) which is the primary reason for home health care (List medical condition): left tibial fracture   The encounter with the patient was in whole, or in part, for the following medical condition, which is the primary reason for home health care: left tibial fracture   I certify that, based on my findings, the following services are medically necessary home health services: Physical therapy   Reason for Medically Necessary Home Health Services: Therapy- Personnel officer, Public librarian   My clinical findings support the need for the above services: Pain interferes with ambulation/mobility   Further, I certify that my clinical findings support that this patient is homebound due to: Pain interferes with ambulation/mobility   For home use only DME 3 n 1   Complete by: As directed    For home use only DME Tub bench   Complete by: As directed    For home use only DME standard manual wheelchair with seat cushion   Complete by: As directed    Patient suffers from left tibial fracture which impairs their ability to perform daily activities like bathing, dressing, and toileting in the home.  A walker will not resolve issue with performing activities of daily living. A wheelchair will allow patient to safely perform daily activities. Patient can safely propel the wheelchair in the home or has a caregiver who can provide assistance. Length of need 6 months . Accessories: elevating leg rests  (ELRs), wheel locks, extensions and anti-tippers.   Home Health   Complete by: As directed    To provide the following care/treatments:  PT OT        Allergies as of 11/05/2020       Reactions   Covid-19 (mrna) Vaccine (pfizer) [covid-19 (mrna) Vaccine] Itching, Palpitations, Other (See Comments)   Throat closing.    Penicillins Hives, Shortness Of Breath, Rash   Has patient had a PCN reaction causing immediate rash, facial/tongue/throat swelling, SOB or lightheadedness with hypotension: Yes Has patient had a PCN reaction causing severe rash involving mucus membranes or skin necrosis: No Has patient had a PCN reaction that required hospitalization No Has patient had a PCN reaction occurring within the last 10 years: No If all of the above answers are "NO", then may proceed with Cephalosporin use.   Bee Venom         Medication List     TAKE these medications    acetaminophen 500 MG tablet Commonly known as: TYLENOL Take 2 tablets (1,000 mg total) by mouth every 8 (eight) hours.   albuterol 108 (90 Base) MCG/ACT inhaler Commonly known as: VENTOLIN HFA INHALE 1-2 PUFFS INTO THE LUNGS AS NEEDED FOR WHEEZING. What changed: reasons to take this   apixaban 2.5 MG Tabs tablet Commonly known as: ELIQUIS Take 1 tablet (2.5 mg total) by mouth 2 (two) times daily.   ascorbic acid 1000 MG tablet Commonly known as: VITAMIN C Take 1 tablet (1,000 mg total) by mouth daily. Start taking on: November 06, 2020   baclofen 10 MG tablet Commonly known as: LIORESAL Take 10 mg by mouth 3 (three) times daily as needed for muscle spasms.   Biotin 10 MG Caps Take 10 mg by mouth daily.   carvedilol 6.25 MG tablet Commonly known as: COREG Take 1 tablet (6.25 mg total) by mouth 2 (two) times daily.   Cholecalciferol 125 MCG (5000 UT) Tabs Take1 tablet (166mg) by mouth daily.   docusate sodium 100 MG  capsule Commonly known as: COLACE Take 1 capsule (100 mg total) by mouth 2 (two)  times daily.   gabapentin 400 MG capsule Commonly known as: NEURONTIN Take 2-3 capsules (800-1,200 mg total) by mouth 3 (three) times daily. 847m in the morning, 8048mat midday, and 120024mn the evening What changed: See the new instructions.   HYDROmorphone 4 MG tablet Commonly known as: DILAUDID Take 1 tablet (4 mg total) by mouth every 6 (six) hours as needed for severe pain.   isosorbide mononitrate 60 MG 24 hr tablet Commonly known as: IMDUR Take 1 tablet (60 mg total) by mouth daily.   Magnesium Oxide 400 MG Caps Take 1 capsule (400 mg total) by mouth daily.   methadone 10 MG tablet Commonly known as: DOLOPHINE Limit 1-2 tablets by mouth 2-3 times per day if tolerated   mirtazapine 15 MG tablet Commonly known as: REMERON Take 1 tablet (15 mg total) by mouth at bedtime.   naloxone 4 MG/0.1ML Liqd nasal spray kit Commonly known as: NARCAN 1 spray in either nostril at signs of opioid overdose.  May repeat in opposite nostril with new spray in 2-3 minutes if no or minimal response such as no improvement in breathing or responsiveness   nitroGLYCERIN 0.4 MG SL tablet Commonly known as: NITROSTAT Place 0.4 mg under the tongue every 5 (five) minutes as needed for chest pain.   rosuvastatin 10 MG tablet Commonly known as: CRESTOR TAKE 1 TABLET BY MOUTH EVERY DAY   vortioxetine HBr 10 MG Tabs tablet Commonly known as: TRINTELLIX Take 1 tablet (10 mg total) by mouth daily.               Durable Medical Equipment  (From admission, onward)           Start     Ordered   11/05/20 1003  DME Walker  Once       Question Answer Comment  Walker: With 5 Inch Wheels   Patient needs a walker to treat with the following condition Left tibial fracture      11/05/20 1003   11/05/20 0000  For home use only DME 3 n 1        11/05/20 1003   11/05/20 0000  For home use only DME Tub bench        11/05/20 1003   11/05/20 0000  For home use only DME standard manual  wheelchair with seat cushion       Comments: Patient suffers from left tibial fracture which impairs their ability to perform daily activities like bathing, dressing, and toileting in the home.  A walker will not resolve issue with performing activities of daily living. A wheelchair will allow patient to safely perform daily activities. Patient can safely propel the wheelchair in the home or has a caregiver who can provide assistance. Length of need 6 months . Accessories: elevating leg rests (ELRs), wheel locks, extensions and anti-tippers.   11/05/20 1003   11/02/20 1032  For home use only DME Other see comment  Once       Comments: Shower bench  Question:  Length of Need  Answer:  12 Months   11/02/20 1032   11/02/20 1030  For home use only DME Walker rolling  Once       Question Answer Comment  Walker: With 5 IReynoeels   Patient needs a walker to treat with the following condition Weakness      11/02/20 1032   11/02/20 1030  For home use only DME 3 n 1  Once        11/02/20 1032   11/02/20 1030  For home use only DME lightweight manual wheelchair with seat cushion  Once       Comments: Patient suffers from  tibial fx  which impairs their ability to perform daily activities like bathing in the home.  A walker will not resolve  issue with performing activities of daily living. A wheelchair will allow patient to safely perform daily activities. Patient is not able to propel themselves in the home using a standard weight wheelchair due to general weakness. Patient can self propel in the lightweight wheelchair. Length of need 12 months . Accessories: elevating leg rests (ELRs), wheel locks, extensions and anti-tippers.   11/02/20 1032              Discharge Care Instructions  (From admission, onward)           Start     Ordered   11/05/20 0000  Discharge wound care:       Comments: Follow up with orthopedic surgeon for surgical wound care   11/05/20 1003             Follow-up Information     Care, [place] Follow up.   Why: Home health services will be provided by [facility], start of care within 24 hours post discharge Contact information: [place] [address] [phone]         Altamese [place], MD. Schedule an appointment as soon as possible for a visit in 2 week(s).   Specialty: Orthopedic Surgery Contact information: [place] [address] [phone]                  Time coordinating discharge: 31 minutes  Signed:  Jennye Boroughs  [facility] 11/05/2020, 10:04 AM   Pager on [email or web address]. If 7PM-7AM, please contact night-coverage at [email or web address]

## 2020-11-07 ENCOUNTER — Telehealth: Payer: Self-pay

## 2020-11-07 NOTE — Telephone Encounter (Signed)
Transition Care Management Unsuccessful Follow-up Telephone Call  Date of discharge and from where:  11/05/20 from [facility]  Attempts:  1st Attempt  Reason for unsuccessful TCM follow-up call:  Unable to reach patient

## 2020-11-08 LAB — VITAMIN K1, SERUM: VITAMIN K1: <0.1 ng/mL — ABNORMAL LOW (ref 0.10–2.20)

## 2020-11-08 NOTE — Telephone Encounter (Signed)
Multiple attempts to schedule .  Removing recall.

## 2020-11-09 ENCOUNTER — Telehealth: Payer: Self-pay

## 2020-11-09 NOTE — Telephone Encounter (Signed)
Transition Care Management Unsuccessful Follow-up Telephone Call  Date of discharge and from where:  11/05/2020-[facility]  Attempts:  2nd Attempt  Reason for unsuccessful TCM follow-up call:  Missing or invalid number  Female answered & said no one lives there by this name

## 2020-11-12 NOTE — Telephone Encounter (Signed)
Transition Care Management Unsuccessful Follow-up Telephone Call  Date of discharge and from where:  11/05/20 from [facility]  Attempts:  3rd Attempt  Reason for unsuccessful TCM follow-up call:  Unable to reach patient

## 2020-11-13 ENCOUNTER — Other Ambulatory Visit (HOSPITAL_COMMUNITY): Payer: Self-pay

## 2020-11-13 ENCOUNTER — Telehealth (HOSPITAL_COMMUNITY): Payer: Self-pay | Admitting: Pharmacist

## 2020-11-13 NOTE — Telephone Encounter (Signed)
Pharmacy Transitions of Care Follow-up Telephone Call  Date of discharge: 11/05/20  Discharge Diagnosis: DVT prophylaxis s/p ORIF left tibia  How have you been since you were released from the hospital?  Doing ok  Medication changes made at discharge: Start:  Acetaminophen Extra Strength (acetaminophen)  docusate sodium (COLACE)  Eliquis (apixaban)  HYDROmorphone (DILAUDID)  Narcan (naloxone)  Natural Vitamin D-3 (Cholecalciferol)  vitamin C         CHANGE how you take: gabapentin   Medication changes verified by the patient?  Y    Medication Accessibility:  Home Pharmacy:  [facility] in [address] in [place]   Was the patient provided with refills on discharged medications? Y   Have all prescriptions been transferred from [facility] to home pharmacy?  Y  Is the patient able to afford medications? Y Notable copays: $9.85/30 day     Medication Review:   APIXABAN (ELIQUIS)  Apixaban 2.5mg  BID x 4 weeks for DVT prophylaxis - Discussed importance of taking medication around the same time everyday  - Reviewed potential DDIs with patient  - Advised patient of medications to avoid (NSAIDs, ASA)  - Educated that Tylenol (acetaminophen) will be the preferred analgesic to prevent risk of bleeding  - Emphasized importance of monitoring for signs and symptoms of bleeding (abnormal bruising, prolonged bleeding, nose bleeds, bleeding from gums, discolored urine, black tarry stools)  - Advised patient to alert all providers of anticoagulation therapy prior to starting a new medication or having a procedure  Follow-up Appointments:  [facility] f/u appt confirmed?  Scheduled to see Dr. Carola Frost on 9/9 @ 2:30pm per pt.   If their condition worsens, is the pt aware to call PCP or go to the Emergency Dept.? Y  Final Patient Assessment:   -Pt is doing fine.  -Pt verbalized understanding of Eliquis.  -Pt has post discharge appointment and refills of other meds sent to [facility]   -Pt to be on Eliquis for 4 weeks but if it is extended, informed patient of her co-pay and to ask for another script.  Dorethea Clan, PharmD

## 2020-11-15 ENCOUNTER — Encounter: Payer: Self-pay | Admitting: Psychiatry

## 2020-11-15 ENCOUNTER — Other Ambulatory Visit: Payer: Self-pay

## 2020-11-15 ENCOUNTER — Telehealth (INDEPENDENT_AMBULATORY_CARE_PROVIDER_SITE_OTHER): Payer: Medicare Other | Admitting: Psychiatry

## 2020-11-15 DIAGNOSIS — F3342 Major depressive disorder, recurrent, in full remission: Secondary | ICD-10-CM | POA: Diagnosis not present

## 2020-11-15 DIAGNOSIS — F411 Generalized anxiety disorder: Secondary | ICD-10-CM

## 2020-11-15 DIAGNOSIS — G4701 Insomnia due to medical condition: Secondary | ICD-10-CM

## 2020-11-15 DIAGNOSIS — F4310 Post-traumatic stress disorder, unspecified: Secondary | ICD-10-CM

## 2020-11-15 DIAGNOSIS — F1590 Other stimulant use, unspecified, uncomplicated: Secondary | ICD-10-CM

## 2020-11-15 NOTE — Progress Notes (Signed)
Virtual Visit via Telephone Gray  I connected with Cheyenne Gray on 11/15/20 at 11:30 AM EDT by telephone and verified that I am speaking with the correct person using two identifiers.  Location Provider Location : [facility] Patient Location : Home  Participants: Patient , Provider    I discussed the limitations, risks, security and privacy concerns of performing an evaluation and management service by telephone and the availability of in person appointments. I also discussed with the patient that there may be a patient responsible charge related to this service. The patient expressed understanding and agreed to proceed.    I discussed the assessment and treatment plan with the patient. The patient was provided an opportunity to ask questions and all were answered. The patient agreed with the plan and demonstrated an understanding of the instructions.   The patient was advised to call back or seek an in-person evaluation if the symptoms worsen or if the condition fails to improve as anticipated.  Cheyenne Gray  11/16/2020 8:15 AM Cheyenne Gray  MRN:  [id number]  Chief Complaint:  Chief Complaint   Follow-up; Anxiety    HPI: Cheyenne Gray is a 55 year old Caucasian female, widowed, on disability, has a history of PTSD, MDD, GAD, insomnia, caffeine use disorder, history of prolonged QT syndrome, chronic back pain, vitamin B12 deficiency, heart failure, iron deficiency, vitamin D deficiency, gastric bypass per history, MI, hypertension was evaluated by telemedicine.  Patient was supposed to come into the office however had to change it to virtual appointment.  Patient reports she is currently recovering from her lower extremity fracture.  Patient recently had left comminuted tibial plateau fracture-on 10/27/2020-reviewed notes by Dr. Tamala Julian in the emergency department-patient is status post ORIF on 10/29/2020, was admitted and discharged on 11/05/2020 with home health ,  PT.  Patient reports she continues to be non weightbearing and uses a wheelchair.  She does have a nurse coming in once a week or so.  She has support from her neighbor as well as her daughter checks in on her.  Patient however reports because of all the physical limitation she is currently sad and anxious.  She reports she also got a notice from her landlord that she has to get one of her dogs out of the house before Monday and that does make her more anxious.  She does not know what to do regarding that.  Patient reports she is compliant on medications.  Sleep is better since being on the mirtazapine.  Denies side effects.  Patient is motivated to start psychotherapy sessions again, has been noncompliant for a while.  Discussed with patient that I will coordinate care with her therapist.  Patient denies any suicidality or homicidality.  Patient denies any other concerns today.  Visit Diagnosis:    ICD-10-CM   1. PTSD (post-traumatic stress disorder)  F43.10     2. GAD (generalized anxiety disorder)  F41.1     3. MDD (major depressive disorder), recurrent, in full remission ([place])  F33.42     4. Insomnia due to medical condition  G47.01    mood, pain,caffeine use , lack of sleep hygiene    5. 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  Past trials of Paxil, Cymbalta, Valium, melatonin, trazodone, Ambien, Wellbutrin, Abilify.  Past Medical History:  Past Medical History:  Diagnosis Date   (HFpEF) heart  failure with preserved ejection fraction ([place])    a. Echo 2014: EF 65-70%, nl WM, mildly dilated LA, PASP nl; b. 12/2014 Echo: EF 65-70%, no rwma, mod septal hypertrophy w/o LVOT gradient or SAM; c. 07/2017 Echo: EF 55-60%, no rwma, mildly dil RV w/ nl syst fxn. Mildly dil RA. Dilated IVC w/ elevated CVP. Triv post effusion.   Anxiety    Asthma    Chronic pain    on methadone, managed  by Dr. Primus Bravo   Concussion    hx of 4   Coronary artery disease, non-occlusive    a. LHC 1/18: proximal to mid LAD 40% stenosed, mid LAD 30% stenosed, mid RCA 20% stenosed, distal RCA 20% stenosed, EF 55-65%, LVEDP normal   Depression    DJD (degenerative joint disease), multiple sites    History of shingles    Hypertension    Iron deficiency anemia    Long QT interval    Obesity    Palpitations    a. 24 hour Holter: NSR, sinus brady down to 48, occasional PVCs & couplets, 8 beats NSVT; b. 30 day event monitor 2015: NSR with rare PVC.   Psoriasis    Syncope and collapse    Vitamin D deficiency    Wears dentures    full upper and lower    Past Surgical History:  Procedure Laterality Date   ABDOMINOPLASTY     tummy tuck ? year    BARIATRIC SURGERY  2001   CARDIAC CATHETERIZATION Left 04/16/2016   Procedure: Left Heart Cath and Coronary Angiography;  Surgeon: [name], MD;  Location: [facility];  Service: Cardiovascular;  Laterality: Left;   CHOLECYSTECTOMY  2001   COLONOSCOPY WITH PROPOFOL N/A 09/28/2018   Procedure: COLONOSCOPY WITH PROPOFOL;  Surgeon: Virgel Manifold, MD;  Location: [facility];  Service: Endoscopy;  Laterality: N/A;   ESOPHAGOGASTRODUODENOSCOPY (EGD) WITH PROPOFOL N/A 09/28/2018   Procedure: ESOPHAGOGASTRODUODENOSCOPY (EGD) WITH BIOPSY;  Surgeon: Virgel Manifold, MD;  Location: [facility];  Service: Endoscopy;  Laterality: N/A;   EXTERNAL FIXATION LEG Left 10/29/2020   Procedure: EXTERNAL FIXATION LEFT KNEE;  Surgeon: Altamese Van Dyne, MD;  Location: [facility];  Service: Orthopedics;  Laterality: Left;   GALLBLADDER SURGERY     GASTRIC BYPASS  2001   GASTROPLASTY      Family Psychiatric History: I have reviewed family psychiatric history from progress Gray on 10/17/2017  Family History:  Family History  Problem Relation Age of Onset   Heart attack Mother    Cancer Mother        pancreatitic 28   Early death Mother     Heart attack Father 37       MI   Early death Father    Heart disease Father    Heart attack Brother    Heart disease Brother    Arthritis Brother    Depression Brother    Diabetes Brother    Heart attack Maternal Grandmother    Cancer Maternal Grandmother        pancreatitic    Heart disease Maternal Grandmother    Lung cancer Maternal Grandmother    Pancreatic cancer Maternal Grandmother    Cancer Maternal Uncle        pancreatitic    Cancer Paternal Grandmother        ? type    Diabetes Paternal Grandmother    Cancer Maternal Uncle        pancreatitic    Breast  cancer Maternal Aunt    Cancer Maternal Aunt        GYN   Cancer Maternal Aunt        GYN    Social History: Reviewed social history from progress Gray on 10/17/2017 Social History   Socioeconomic History   Marital status: Widowed    Spouse name: Not on file   Number of children: 1   Years of education: assoc degree   Highest education level: Associate degree: occupational, Hotel manager, or vocational program  Occupational History   Not on file  Tobacco Use   Smoking status: Never   Smokeless tobacco: Never  Vaping Use   Vaping Use: Never used  Substance and Sexual Activity   Alcohol use: No    Alcohol/week: 0.0 standard drinks    Comment: holidays   Drug use: No   Sexual activity: Not Currently  Other Topics Concern   Not on file  Social History Narrative   Adopted daughter [name] [phone] (now lives in [place] going to [place] has apt there)   Significant other mac [phone], former husband died    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    Penicillins Hives, Shortness Of Breath and Rash    Has patient had a PCN reaction causing immediate rash, facial/tongue/throat swelling, SOB or lightheadedness with hypotension: Yes Has patient had a PCN reaction causing severe rash involving mucus membranes or skin necrosis: No Has patient had a PCN reaction that required hospitalization No Has patient had a PCN reaction occurring within the last 10 years: No If all of the above answers are "NO", then may proceed with Cephalosporin use.    Bee Venom     Metabolic Disorder Labs: Lab Results  Component Value Date   HGBA1C 5.3 09/01/2018   No results found for: PROLACTIN Lab Results  Component Value Date   CHOL 114 12/07/2019   TRIG 63 12/07/2019   HDL 54 12/07/2019   CHOLHDL 2.1 12/07/2019   VLDL 13 12/07/2019   LDLCALC 47 12/07/2019   LDLCALC 95 09/01/2018   Lab Results  Component Value Date   TSH 3.915 12/07/2019   TSH 3.250 11/09/2019    Therapeutic Level Labs: No results found for: LITHIUM No results found for: VALPROATE No components found for:  CBMZ  Current Medications: Current Outpatient Medications  Medication Sig Dispense Refill   acetaminophen (TYLENOL) 500 MG tablet Take 2 tablets (1,000 mg total) by mouth every 8 (eight) hours. 90 tablet 0   albuterol (VENTOLIN HFA) 108 (90 Base) MCG/ACT inhaler INHALE 1-2 PUFFS INTO THE LUNGS AS NEEDED FOR WHEEZING. (Patient taking differently: Inhale 1-2 puffs into the lungs as needed for wheezing or shortness of breath.) 18 each 11   apixaban (ELIQUIS) 2.5 MG TABS tablet Take 1 tablet (2.5 mg total) by mouth 2 (two) times daily. 60 tablet 0   ascorbic acid (VITAMIN C) 1000 MG tablet Take 1 tablet (1,000 mg total) by mouth  daily. 30 tablet 1   baclofen (LIORESAL) 10 MG tablet Take 10 mg by mouth 3 (three) times daily as needed for muscle spasms.     Biotin 10 MG CAPS Take 10 mg  by mouth daily.     carvedilol (COREG) 6.25 MG tablet Take 1 tablet (6.25 mg total) by mouth 2 (two) times daily. 60 tablet 5   Cholecalciferol 125 MCG (5000 UT) TABS Take1 tablet (116mg) by mouth daily. 30 tablet 3   docusate sodium (COLACE) 100 MG capsule Take 1 capsule (100 mg total) by mouth 2 (two) times daily. 30 capsule 0   gabapentin (NEURONTIN) 400 MG capsule Take 2-3 capsules (800-1,200 mg total) by mouth 3 (three) times daily. 8059min the morning, 800106mt midday, and 1200m15m the evening     HYDROmorphone (DILAUDID) 4 MG tablet Take 1 tablet (4 mg total) by mouth every 6 (six) hours as needed for severe pain. 28 tablet 0   isosorbide mononitrate (IMDUR) 60 MG 24 hr tablet Take 1 tablet (60 mg total) by mouth daily. 30 tablet 2   Magnesium Oxide 400 MG CAPS Take 1 capsule (400 mg total) by mouth daily. 30 capsule 6   methadone (DOLOPHINE) 10 MG tablet Limit 1-2 tablets by mouth 2-3 times per day if tolerated 180 tablet 0   mirtazapine (REMERON) 15 MG tablet Take 1 tablet (15 mg total) by mouth at bedtime. (Patient not taking: Reported on 10/28/2020) 30 tablet 1   naloxone (NARCAN) nasal spray 4 mg/0.1 mL 1 spray in either nostril at signs of opioid overdose.  May repeat in opposite nostril with new spray in 2-3 minutes if no or minimal response such as no improvement in breathing or responsiveness 2 each 1   nitroGLYCERIN (NITROSTAT) 0.4 MG SL tablet Place 0.4 mg under the tongue every 5 (five) minutes as needed for chest pain.     rosuvastatin (CRESTOR) 10 MG tablet TAKE 1 TABLET BY MOUTH EVERY DAY (Patient taking differently: Take 10 mg by mouth daily.) 90 tablet 3   vortioxetine HBr (TRINTELLIX) 10 MG TABS tablet Take 1 tablet (10 mg total) by mouth daily. 30 tablet 1   No current facility-administered medications for this visit.     Musculoskeletal: Strength & Muscle Tone:  UTA Gait & Station:  UTA Patient leans: N/A  Psychiatric Specialty Exam: Review of Systems   Musculoskeletal:  Positive for back pain.       Left sided LE pain - S/P ORIF   Psychiatric/Behavioral:  Positive for dysphoric mood and sleep disturbance. The patient is nervous/anxious.   All other systems reviewed and are negative.  Last menstrual period 02/18/2016.There is no height or weight on file to calculate BMI.  General Appearance:  UTA  Eye Contact:   UTA  Speech:  Clear and Coherent  Volume:  Normal  Mood:  Anxious, sad about situation  Affect:   UTA  Thought Process:  Goal Directed and Descriptions of Associations: Intact  Orientation:  Full (Time, Place, and Person)  Thought Content: Logical   Suicidal Thoughts:  No  Homicidal Thoughts:  No  Memory:  Immediate;   Fair Recent;   Fair Remote;   Fair  Judgement:  Fair  Insight:  Fair  Psychomotor Activity:   UTA  Concentration:  Concentration: Fair and Attention Span: Fair  Recall:  FairAES CorporationKnowledge: Fair  Language: Fair  Akathisia:  No  Handed:  Right  AIMS (if indicated): not done  Assets:  Communication Skills Desire for Improvement Housing Social Support  ADL's:  Intact  Cognition: WNL  Sleep:   improving   Screenings: GAD-7    Flowsheet Row Video Visit from 02/15/2020 in [facility] Office Visit from 07/26/2019 in [facility] Office Visit from 09/21/2015 in [facility]  Total GAD-7 Score _0 Mini-Mental    [facility] Office Visit from 10/30/2017 in [facility]  Total Score (max 30 points ) 27      PHQ2-9    [place] Visit from 10/22/2020 in [facility] Video Visit from 08/14/2020 in [facility] Video Visit from 02/15/2020 in [facility] Office Visit from 07/26/2019 in [facility] Office Visit from 12/21/2018 in [facility]  PHQ-2 Total  Score _1 0  PHQ-9 Total Score _2 --      Flowsheet Row Admission (Discharged) from 10/29/2020 in [facility] Office Visit from 10/22/2020 in [facility] Video Visit from 08/14/2020 in [facility] No Risk No Risk No Risk        Assessment and Plan: Cheyenne Gray is a 56 year old Caucasian female, widowed, has a history of PTSD, depression, anxiety, multiple medical problems including coronary artery disease, prolonged QT per history, PVC, gastric bypass, vitamin B12 deficiency, vitamin D deficiency, chronic pain on methadone was evaluated by telemedicine today.  Patient recently with left comminuted tibial plateau fracture-status post ORIF, admitted and discharged from the hospital on 11/05/2020 continues to have physical limitations as well as pain which does have an impact on her mood.  She also has psychosocial stressors of her housing problems, will benefit from the following plan.  Plan PTSD-stable Continue CBT  MDD in remission Trintellix 10 mg p.o. daily  GAD-unstable Likely due to her current situational stressors. Recommended restarting psychotherapy sessions.  We will coordinate care with Ms. Christina Hussami I have also communicated with the front desk staff to schedule an appointment as soon as possible with her therapist.  Patient encouraged to be compliant. Trintellix 10 mg p.o. daily  Insomnia-improving Mirtazapine 15 mg p.o. nightly  Caffeine use disorder-unstable Provided counseling.  I have reviewed notes per most recent admission to the hospital dated 10/29/2020 - 11/05/2020-Dr.Ayiku -as noted above.  Follow-up in clinic in 4 weeks in person.   I have spent at least 20 minutes non face to face with patient today.    This Gray was generated in part or whole with voice recognition software. Voice recognition is usually quite accurate  but there are transcription errors that can and very often do occur. I apologize for any typographical errors that were not detected and corrected.      Ursula Alert, MD 11/16/2020, 8:16 AM

## 2020-11-18 ENCOUNTER — Other Ambulatory Visit: Payer: Self-pay | Admitting: Psychiatry

## 2020-11-18 DIAGNOSIS — G4701 Insomnia due to medical condition: Secondary | ICD-10-CM

## 2020-11-19 ENCOUNTER — Other Ambulatory Visit: Payer: Self-pay | Admitting: Psychiatry

## 2020-11-19 ENCOUNTER — Other Ambulatory Visit: Payer: Self-pay | Admitting: Cardiovascular Disease

## 2020-11-19 DIAGNOSIS — F411 Generalized anxiety disorder: Secondary | ICD-10-CM

## 2020-11-19 DIAGNOSIS — F4310 Post-traumatic stress disorder, unspecified: Secondary | ICD-10-CM

## 2020-11-19 DIAGNOSIS — F331 Major depressive disorder, recurrent, moderate: Secondary | ICD-10-CM

## 2020-11-19 NOTE — Op Note (Signed)
11/19/2020 12:30 PM  PATIENT:  Cheyenne Gray  55 y.o. female [id number]  PRE-OPERATIVE DIAGNOSIS:   1. LEFT BICONDYLAR TIBIAL PLATEAU FRACTURE 2. LEFT TIBIAL SPINE/ EMINENCE FRACTURE 3. LEFT LATERAL MENISCUS TEAR 4. LEFT TIBIAL SHAFT FRACTURE  POST-OPERATIVE DIAGNOSIS:   1. LEFT BICONDYLAR TIBIAL PLATEAU FRACTURE 2. LEFT TIBIAL SPINE/ EMINENCE FRACTURE 3. LEFT LATERAL MENISCUS TEAR 4. LEFT TIBIAL SHAFT FRACTURE  PROCEDURE:  Procedure(s): 1. OPEN REDUCTION INTERNAL FIXATION (ORIF) BICONDYLAR TIBIAL PLATEAU (Left) 2. OPEN TREATMENT OF TIBIAL SPINE/ EMINENCE FRACTURE (Left) 3. OPEN TREATMENT OF TIBIAL SHAFT FRACTURE (Left) 4. ARTHROTOMY WITH PARTIAL LATERAL MENISCETOMY 5. ANTERIOR COMPARTMENT FASCIOTOMY 6. APPLICATION OF STRESS UNDER FLUORO  SURGEON:  Surgeon(s) and Role:    Myrene Galas, MD - Primary  PHYSICIAN ASSISTANT: Montez Morita, PA-C  ANESTHESIA:   general  EBL:  25 mL   BLOOD ADMINISTERED:none  DRAINS: none   LOCAL MEDICATIONS USED:  NONE  SPECIMEN: None  DISPOSITION OF SPECIMEN:  N/A  COUNTS:  YES  TOURNIQUET:  * Missing tourniquet times found for documented tourniquets in log: [id number] *  DICTATION: Written below.  PLAN OF CARE: Admit to inpatient   PATIENT DISPOSITION:  PACU - hemodynamically stable.   Delay start of Pharmacological VTE agent (>24hrs) due to surgical blood loss or risk of bleeding: no     BRIEF SUMMARY AND INDICATION FOR PROCEDURE:  Patient is a 55 y.o.-year- old with a tibial plateau fracture, treated provisionally with immediate ice, elevation, and active motion of the foot and toes to facilitate resolution of soft tissue swelling.  We did discuss with the patient and her husband the risks and benefits of surgical treatment including the potential for arthritis, nerve injury, vessel injury, loss of motion, DVT, PE, heart attack, stroke, symptomatic hardware, need for further surgery, and multiple others.  The patient  acknowledged these risks and wished to proceed.   BRIEF SUMMARY OF PROCEDURE:  After administration of preoperative antibiotics, the patient was taken to the operating room.  General anesthesia was induced and the lower extremity prepped and draped with a chlorhexidine wash and betadine scrub and paint. A timeout was performed. I then brought in towel bumps and applied traction with the help of my assistant to gain more length and slightly adjust the alignment. A curvilinear incision was made extending laterally over Gerdy's tubercle. Dissection was carried down where the soft tissues were divided proximal to the joint line, then coronary ligament incised for the arthrotomy which revealed a lateral meniscus tear. This trimmed using s 15 blade and backbiter back to a healthy stable edge. I then turned attention to the tibia.  I was then able to insert the 7 hole Zimmer NCB tibial plateau plate and pin it just on the lateral condyle proximally at the joint line. I maneuvered the medial and lateral condyles into position with the tibial spine positioned so that it would be secured between to the two condylar fragments. I used the [employer] clamp to compress these three components together and achieve reduction. After placing two proximal screws at the subchondral joint line of the bicondylar plateau, I placed a single distal screw and then turned attention to the tibial shaft component of the fracture.  With regard to the shaft, I first placed a lag screw in the metaphysis to swing the shaft into proper alignment from a valgus position. I then placed the most proximal bicortical screw to further improved this, returning once more to the lag screw at the  top of shaft component. Once I was satisfied with shaft reduction an additional screw placed distally, then the lag removed. Final images showed excellent alignment and three bicortical screws distally. At this point, I placed the remaining screw at the joint line  and one more below it, all of which we would later convert to locked fixation by placing locking caps. No locking caps were placed distally. Final images showed appropriate reduction, implant position and length. All wounds were irrigated thoroughly. Application of stress under fluoro showed no varus or valgus instability post fixation.   Prior to closure, I turned my attention to the distal edge of the wound here underneath the skin.  I used the long scissors to spread both superficial and deep to the anterior compartment.  The fascia was then released for 8 to 10 cm to reduce the likelihood of the postoperative compartment syndrome.  Once more, wound was irrigated and then a standard layered closure performed, 0 Vicryl, 2-0 Vicryl, and 3-0 nylon for the skin.  Sterile gently compressive dressing was applied and in knee immobilizer.  The patient was taken to the PACU in stable condition.    PROGNOSIS: The patient will not require formal bracing given the stable examination post fixation. Unrestricted range of motion will begin immediately with nonweightbearing on the operative extremity, pharmacologic DVT prophylaxis, and mobilize with PT and OT. After discharge, we will plan to see patient back in about 2 weeks for removal of sutures. Discharge anticipated in two days.         Doralee Albino. Carola Frost, M.D.

## 2020-11-27 ENCOUNTER — Other Ambulatory Visit (HOSPITAL_COMMUNITY): Payer: Self-pay

## 2020-12-11 ENCOUNTER — Other Ambulatory Visit: Payer: Self-pay

## 2020-12-11 ENCOUNTER — Telehealth (INDEPENDENT_AMBULATORY_CARE_PROVIDER_SITE_OTHER): Payer: Self-pay | Admitting: Psychiatry

## 2020-12-11 DIAGNOSIS — Z5329 Procedure and treatment not carried out because of patient's decision for other reasons: Secondary | ICD-10-CM

## 2020-12-11 NOTE — Progress Notes (Signed)
No response to call , left voicemail

## 2020-12-13 ENCOUNTER — Encounter: Payer: Self-pay | Admitting: Psychiatry

## 2020-12-13 ENCOUNTER — Telehealth (INDEPENDENT_AMBULATORY_CARE_PROVIDER_SITE_OTHER): Payer: Medicare Other | Admitting: Psychiatry

## 2020-12-13 ENCOUNTER — Other Ambulatory Visit: Payer: Self-pay

## 2020-12-13 DIAGNOSIS — I200 Unstable angina: Secondary | ICD-10-CM

## 2020-12-13 DIAGNOSIS — F3342 Major depressive disorder, recurrent, in full remission: Secondary | ICD-10-CM

## 2020-12-13 DIAGNOSIS — Z91148 Patient's other noncompliance with medication regimen for other reason: Secondary | ICD-10-CM | POA: Insufficient documentation

## 2020-12-13 DIAGNOSIS — F411 Generalized anxiety disorder: Secondary | ICD-10-CM | POA: Diagnosis not present

## 2020-12-13 DIAGNOSIS — Z9114 Patient's other noncompliance with medication regimen: Secondary | ICD-10-CM

## 2020-12-13 DIAGNOSIS — G4701 Insomnia due to medical condition: Secondary | ICD-10-CM | POA: Diagnosis not present

## 2020-12-13 DIAGNOSIS — F4310 Post-traumatic stress disorder, unspecified: Secondary | ICD-10-CM | POA: Diagnosis not present

## 2020-12-13 DIAGNOSIS — F1590 Other stimulant use, unspecified, uncomplicated: Secondary | ICD-10-CM

## 2020-12-13 NOTE — Progress Notes (Signed)
Virtual Visit via Video Note  I connected with Cheyenne Gray on 12/13/20 at 11:30 AM EDT by a video enabled telemedicine application and verified that I am speaking with the correct person using two identifiers.  Location Provider Location : [facility] Patient Location : Home  Participants: Patient , Provider    I discussed the limitations of evaluation and management by telemedicine and the availability of in person appointments. The patient expressed understanding and agreed to proceed.   I discussed the assessment and treatment plan with the patient. The patient was provided an opportunity to ask questions and all were answered. The patient agreed with the plan and demonstrated an understanding of the instructions.   The patient was advised to call back or seek an in-person evaluation if the symptoms worsen or if the condition fails to improve as anticipated.  [facility] MD OP Progress Note  12/13/2020 12:41 PM Cheyenne Gray  MRN:  [id number]  Chief Complaint:  Chief Complaint   Follow-up; Depression; Anxiety    HPI: [name] is a 55 year old Caucasian female, widowed, on disability, has a history of PTSD, MDD, GAD, insomnia, caffeine use disorder, history of prolonged QT syndrome, chronic back pain, vitamin B12 deficiency, heart failure, iron deficiency, vitamin D deficiency, gastric bypass per history, MI, hypertension was evaluated by telemedicine today.  Patient today reports she is currently recovering from her lower extremity fracture-left-sided communiuated tibial plateau fracture-status post ORIF on 10/29/2020.  Patient is currently receiving physical therapy.  She reports she has not started walking yet and may have to wait another 2 weeks.  She does have good support system from friends.  Patient reports mood wise she is overall okay. Denies any significant depression.  Does have anxiety, mostly situational due to her psychosocial stressors.  She has been noncompliant with  psychotherapy sessions and agrees to get in touch.  Patient stopped taking Trintellix, reports she was confused whether she was supposed to take it or not.  Patient relates she was taken off of the Trintellix during her most recent hospital admission in August. (However per review of discharge summary she was continued on the same and this was discussed with patient.).  She has not taken it in the past 3 weeks or more.  She reports she is fine with regards to her mood without the Trintellix.  She is currently on the mirtazapine.  Reports sleep is good as long as she sleeps on a recliner.  Denies side effects.  Patient denies any suicidality, homicidality or perceptual disturbances.  Patient denies any other concerns today.  Visit Diagnosis:    ICD-10-CM   1. PTSD (post-traumatic stress disorder)  F43.10     2. GAD (generalized anxiety disorder)  F41.1     3. MDD (major depressive disorder), recurrent, in full remission ([place])  F33.42     4. Insomnia due to medical condition  G47.01    mood, pain, caffeine    5. 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 Noncompliance with medication regimen  Z91.14       Past Psychiatric History: Reviewed past psychiatric history from progress note on 10/17/2017.  Past trials of Paxil, Cymbalta, Valium, melatonin, trazodone, Ambien, Wellbutrin, Abilify  Past Medical History:  Past Medical History:  Diagnosis Date   (HFpEF) heart failure with preserved ejection fraction ([place])    a. Echo 2014: EF 65-70%, nl WM, mildly dilated LA, PASP nl; b. 12/2014 Echo: EF 65-70%, no rwma, mod septal hypertrophy w/o  LVOT gradient or SAM; c. 07/2017 Echo: EF 55-60%, no rwma, mildly dil RV w/ nl syst fxn. Mildly dil RA. Dilated IVC w/ elevated CVP. Triv post effusion.   Anxiety    Asthma    Chronic pain    on methadone, managed by Dr. Primus Bravo   Concussion    hx of 4   Coronary artery disease, non-occlusive    a. LHC 1/18: proximal to mid LAD 40% stenosed, mid LAD 30%  stenosed, mid RCA 20% stenosed, distal RCA 20% stenosed, EF 55-65%, LVEDP normal   Depression    DJD (degenerative joint disease), multiple sites    History of shingles    Hypertension    Iron deficiency anemia    Long QT interval    Obesity    Palpitations    a. 24 hour Holter: NSR, sinus brady down to 48, occasional PVCs & couplets, 8 beats NSVT; b. 30 day event monitor 2015: NSR with rare PVC.   Psoriasis    Syncope and collapse    Vitamin D deficiency    Wears dentures    full upper and lower    Past Surgical History:  Procedure Laterality Date   ABDOMINOPLASTY     tummy tuck ? year    BARIATRIC SURGERY  2001   CARDIAC CATHETERIZATION Left 04/16/2016   Procedure: Left Heart Cath and Coronary Angiography;  Surgeon: [name], MD;  Location: [facility];  Service: Cardiovascular;  Laterality: Left;   CHOLECYSTECTOMY  2001   COLONOSCOPY WITH PROPOFOL N/A 09/28/2018   Procedure: COLONOSCOPY WITH PROPOFOL;  Surgeon: Virgel Manifold, MD;  Location: [facility];  Service: Endoscopy;  Laterality: N/A;   ESOPHAGOGASTRODUODENOSCOPY (EGD) WITH PROPOFOL N/A 09/28/2018   Procedure: ESOPHAGOGASTRODUODENOSCOPY (EGD) WITH BIOPSY;  Surgeon: Virgel Manifold, MD;  Location: [facility];  Service: Endoscopy;  Laterality: N/A;   EXTERNAL FIXATION LEG Left 10/29/2020   Procedure: EXTERNAL FIXATION LEFT KNEE;  Surgeon: Altamese Edmonson, MD;  Location: [facility];  Service: Orthopedics;  Laterality: Left;   GALLBLADDER SURGERY     GASTRIC BYPASS  2001   GASTROPLASTY      Family Psychiatric History: Reviewed family psychiatric history from progress note on 10/17/2017  Family History:  Family History  Problem Relation Age of Onset   Heart attack Mother    Cancer Mother        pancreatitic 11   Early death Mother    Heart attack Father 44       MI   Early death Father    Heart disease Father    Heart attack Brother    Heart disease Brother    Arthritis Brother     Depression Brother    Diabetes Brother    Heart attack Maternal Grandmother    Cancer Maternal Grandmother        pancreatitic    Heart disease Maternal Grandmother    Lung cancer Maternal Grandmother    Pancreatic cancer Maternal Grandmother    Cancer Maternal Uncle        pancreatitic    Cancer Paternal Grandmother        ? type    Diabetes Paternal Grandmother    Cancer Maternal Uncle        pancreatitic    Breast cancer Maternal Aunt    Cancer Maternal Aunt        GYN   Cancer Maternal Aunt        GYN    Social  History: Reviewed social history from progress note on 10/17/2017 Social History   Socioeconomic History   Marital status: Widowed    Spouse name: Not on file   Number of children: 1   Years of education: assoc degree   Highest education level: Associate degree: occupational, Hotel manager, or vocational program  Occupational History   Not on file  Tobacco Use   Smoking status: Never   Smokeless tobacco: Never  Vaping Use   Vaping Use: Never used  Substance and Sexual Activity   Alcohol use: No    Alcohol/week: 0.0 standard drinks    Comment: holidays   Drug use: No   Sexual activity: Not Currently  Other Topics Concern   Not on file  Social History Narrative   Adopted daughter [name] [phone] (now lives in [place] going to [place] has apt there)   Significant other mac [phone], former husband died    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    Penicillins Hives, Shortness Of Breath and Rash    Has  patient had a PCN reaction causing immediate rash, facial/tongue/throat swelling, SOB or lightheadedness with hypotension: Yes Has patient had a PCN reaction causing severe rash involving mucus membranes or skin necrosis: No Has patient had a PCN reaction that required hospitalization No Has patient had a PCN reaction occurring within the last 10 years: No If all of the above answers are "NO", then may proceed with Cephalosporin use.    Bee Venom     Metabolic Disorder Labs: Lab Results  Component Value Date   HGBA1C 5.3 09/01/2018   No results found for: PROLACTIN Lab Results  Component Value Date   CHOL 114 12/07/2019   TRIG 63 12/07/2019   HDL 54 12/07/2019   CHOLHDL 2.1 12/07/2019   VLDL 13 12/07/2019   LDLCALC 47 12/07/2019   LDLCALC 95 09/01/2018   Lab Results  Component Value Date   TSH 3.915 12/07/2019   TSH 3.250 11/09/2019    Therapeutic Level Labs: No results found for: LITHIUM No results found for: VALPROATE No components found for:  CBMZ  Current Medications: Current Outpatient Medications  Medication Sig Dispense Refill   acetaminophen (TYLENOL) 500 MG tablet Take 2 tablets (1,000 mg total) by mouth every 8 (eight) hours. 90 tablet 0   albuterol (VENTOLIN HFA) 108 (90 Base) MCG/ACT inhaler INHALE 1-2 PUFFS INTO THE LUNGS AS NEEDED FOR WHEEZING. (Patient taking differently: Inhale 1-2 puffs into the lungs as needed for wheezing or shortness of breath.) 18 each 11   apixaban (ELIQUIS) 2.5 MG TABS tablet Take 1 tablet (2.5 mg total) by mouth 2 (two) times daily. 60 tablet 0   ascorbic acid (VITAMIN C) 1000 MG tablet Take 1 tablet (1,000 mg total) by mouth daily. 30 tablet 1   baclofen (LIORESAL) 10 MG tablet Take 10 mg by mouth 3 (three) times daily as needed for muscle spasms.     Biotin 10 MG CAPS Take  10 mg by mouth daily.     carvedilol (COREG) 6.25 MG tablet Take 1 tablet (6.25 mg total) by mouth 2 (two) times daily. 60 tablet 5   Cholecalciferol 125 MCG  (5000 UT) TABS Take1 tablet (119mg) by mouth daily. 30 tablet 3   docusate sodium (COLACE) 100 MG capsule Take 1 capsule (100 mg total) by mouth 2 (two) times daily. 30 capsule 0   gabapentin (NEURONTIN) 400 MG capsule Take 2-3 capsules (800-1,200 mg total) by mouth 3 (three) times daily. 8069min the morning, 80054mt midday, and 1200m75m the evening     HYDROmorphone (DILAUDID) 4 MG tablet Take 1 tablet (4 mg total) by mouth every 6 (six) hours as needed for severe pain. 28 tablet 0   isosorbide mononitrate (IMDUR) 60 MG 24 hr tablet TAKE 1 TABLET BY MOUTH EVERY DAY 90 tablet 0   Magnesium Oxide 400 MG CAPS Take 1 capsule (400 mg total) by mouth daily. 30 capsule 6   methadone (DOLOPHINE) 10 MG tablet Limit 1-2 tablets by mouth 2-3 times per day if tolerated 180 tablet 0   mirtazapine (REMERON) 15 MG tablet TAKE 1 TABLET BY MOUTH EVERYDAY AT BEDTIME 90 tablet 0   naloxone (NARCAN) nasal spray 4 mg/0.1 mL 1 spray in either nostril at signs of opioid overdose.  May repeat in opposite nostril with new spray in 2-3 minutes if no or minimal response such as no improvement in breathing or responsiveness 2 each 1   nitroGLYCERIN (NITROSTAT) 0.4 MG SL tablet Place 0.4 mg under the tongue every 5 (five) minutes as needed for chest pain.     rosuvastatin (CRESTOR) 10 MG tablet TAKE 1 TABLET BY MOUTH EVERY DAY (Patient taking differently: Take 10 mg by mouth daily.) 90 tablet 3   No current facility-administered medications for this visit.     Musculoskeletal: Strength & Muscle Tone:  UTA Gait & Station:  Seated Patient leans: Backward  Psychiatric Specialty Exam: Review of Systems  Musculoskeletal:        Status post left tibial fracture-ORIF  Psychiatric/Behavioral:  The patient is nervous/anxious.   All other systems reviewed and are negative.  Last menstrual period 02/18/2016.There is no height or weight on file to calculate BMI.  General Appearance: Casual  Eye Contact:  Good  Speech:   Clear and Coherent  Volume:  Normal  Mood:  Anxious  Affect:  Congruent  Thought Process:  Goal Directed and Descriptions of Associations: Intact  Orientation:  Full (Time, Place, and Person)  Thought Content: Logical   Suicidal Thoughts:  No  Homicidal Thoughts:  No  Memory:  Immediate;   Fair Recent;   Fair Remote;   Fair  Judgement:  Fair  Insight:  Fair  Psychomotor Activity:  Normal  Concentration:  Concentration: Fair and Attention Span: Fair  Recall:  FairAES CorporationKnowledge: Fair  Language: Fair  Akathisia:  No  Handed:  Right  AIMS (if indicated): done  Assets:  Communication Skills Desire for Improvement Housing Social Support  ADL's:  Intact  Cognition: WNL  Sleep:   restless   Screenings: GAD-7    Flowsheet Row Video Visit from [facility]/2020 in [facility] from [facility]/2019 in [facility] GAD-7 Score _0 Mini-Mental    [place]  [facility] Visit from 10/30/2017 in [facility]  Total Score (max 30 points ) 27      PHQ2-9    Flowsheet Row Video Visit from 12/13/2020 in [facility] Office Visit from 10/22/2020 in [facility] Video Visit from 08/14/2020 in [facility] Video Visit from 02/15/2020 in [facility] Office Visit from 07/26/2019 in [facility]  PHQ-2 Total Score 0 _0 PHQ-9 Total Score _1 Flowsheet Row Admission (Discharged) from 10/29/2020 in [facility] Office Visit from 10/22/2020 in [facility] Video Visit from 08/14/2020 in [facility] No  Risk No Risk No Risk        Assessment and Plan: CHYNA KNEECE is a 55 year old Caucasian , widowed has a history of PTSD, depression, anxiety, multiple medical problems including recent tibial plateau fracture status post ORIF, was evaluated by telemedicine today.  Patient reports mood symptoms are stable although she is noncompliant on the Trintellix.  She does have anxiety about situational stressors and will benefit from continued psychotherapy sessions.  Plan PTSD-stable Continue CBT  MDD in remission Discontinue Trintellix for noncompliance Continue mirtazapine as prescribed AIMS -0  GAD-improving Mirtazapine 15 mg p.o. nightly Patient advised to restart in psychotherapy sessions.  Advised to contact the front desk to schedule  Insomnia-improving Mirtazapine 15 mg p.o. nightly  Caffeine use disorder-improving Provided counseling  Noncompliance with medications-provided education.  Since her mood symptoms are currently stable we will not restart Trintellix which she has not been taking since the past few weeks.  However will consider restarting as needed.  Follow-up in clinic in 1 month or sooner if needed.  This note was generated in part or whole with voice recognition software. Voice recognition is usually quite accurate but there are transcription errors that can and very often do occur. I apologize for any typographical errors that were not detected and corrected.      Ursula Alert, MD 12/13/2020, 12:41 PM

## 2020-12-26 ENCOUNTER — Ambulatory Visit: Payer: Medicare Other

## 2021-01-10 ENCOUNTER — Other Ambulatory Visit: Payer: Self-pay

## 2021-01-10 ENCOUNTER — Telehealth (INDEPENDENT_AMBULATORY_CARE_PROVIDER_SITE_OTHER): Payer: Medicare Other | Admitting: Psychiatry

## 2021-01-10 ENCOUNTER — Encounter: Payer: Self-pay | Admitting: Psychiatry

## 2021-01-10 DIAGNOSIS — G4701 Insomnia due to medical condition: Secondary | ICD-10-CM | POA: Diagnosis not present

## 2021-01-10 DIAGNOSIS — F3342 Major depressive disorder, recurrent, in full remission: Secondary | ICD-10-CM | POA: Diagnosis not present

## 2021-01-10 DIAGNOSIS — I200 Unstable angina: Secondary | ICD-10-CM

## 2021-01-10 DIAGNOSIS — F4310 Post-traumatic stress disorder, unspecified: Secondary | ICD-10-CM

## 2021-01-10 DIAGNOSIS — F1590 Other stimulant use, unspecified, uncomplicated: Secondary | ICD-10-CM

## 2021-01-10 DIAGNOSIS — F411 Generalized anxiety disorder: Secondary | ICD-10-CM

## 2021-01-10 NOTE — Progress Notes (Signed)
Virtual Visit via Video Note  I connected with Cheyenne Gray on 01/10/21 at  4:00 PM EDT by a video enabled telemedicine application and verified that I am speaking with the correct person using two identifiers.  Location Provider Location : [facility] Patient Location : Home  Participants: Patient , Provider   I discussed the limitations of evaluation and management by telemedicine and the availability of in person appointments. The patient expressed understanding and agreed to proceed.   I discussed the assessment and treatment plan with the patient. The patient was provided an opportunity to ask questions and all were answered. The patient agreed with the plan and demonstrated an understanding of the instructions.   The patient was advised to call back or seek an in-person evaluation if the symptoms worsen or if the condition fails to improve as anticipated.   [facility] MD OP Progress Note  01/10/2021 4:45 PM Cheyenne Gray  MRN:  [id number]  Chief Complaint:  Chief Complaint   Follow-up; Anxiety    HPI: Cheyenne Gray is a 55 year old Caucasian female, widowed, on disability, has a history of PTSD, MDD, GAD, insomnia, caffeine use disorder, history of prolonged QT syndrome, chronic back pain, vitamin B12 deficiency, heart failure, iron deficiency, vitamin D deficiency, gastric bypass per history, MI, hypertension was evaluated by telemedicine today.  Patient is status post left-sided, tibial plateau fracture status post ORIF on 10/29/2020.  Patient reports she continues to struggle with her mobility, was released to start weightbearing as tolerated, currently tries to walk with a walker.  She also has PT coming into her home twice a week.  She does have support from a neighbor and a friend who checks in on her.  She continues to talk to her daughter who is currently at [facility], she is happy that they are currently on talking terms.  Patient reports overall her mood symptoms are currently  stable.  She does feel down sometimes since she is stuck at home all day with nothing much to do.  This is a big adjustment for her.  She however currently denies any significant depression symptoms.  Patient reports anxiety as more manageable.  She reports sleep is good.  She is currently compliant on the mirtazapine.  Denies side effects.  Patient denies any suicidality, homicidality or perceptual disturbances.  Patient is agreeable to starting psychotherapy session given her situational stressors.  Patient denies any other concerns today.  Visit Diagnosis:    ICD-10-CM   1. PTSD (post-traumatic stress disorder)  F43.10     2. GAD (generalized anxiety disorder)  F41.1     3. MDD (major depressive disorder), recurrent, in full remission ([place])  F33.42     4. Insomnia due to medical condition  G47.01    mood, pain, caffeine     5. 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  Past trials of Paxil, Cymbalta, Valium, melatonin, trazodone, Ambien, Wellbutrin, Abilify  Past Medical History:  Past Medical History:  Diagnosis Date   (HFpEF) heart failure with preserved ejection fraction ([place])    a. Echo 2014: EF 65-70%, nl WM, mildly dilated LA, PASP nl; b. 12/2014 Echo: EF 65-70%, no rwma, mod septal hypertrophy w/o LVOT gradient or SAM; c. 07/2017 Echo: EF 55-60%, no rwma, mildly dil RV w/ nl syst fxn. Mildly dil RA. Dilated IVC w/ elevated CVP. Triv post effusion.   Anxiety  Asthma    Chronic pain    on methadone, managed by Dr. Primus Bravo   Concussion    hx of 4   Coronary artery disease, non-occlusive    a. LHC 1/18: proximal to mid LAD 40% stenosed, mid LAD 30% stenosed, mid RCA 20% stenosed, distal RCA 20% stenosed, EF 55-65%, LVEDP normal   Depression    DJD (degenerative joint disease), multiple sites    History of shingles    Hypertension    Iron deficiency anemia    Long QT interval     Obesity    Palpitations    a. 24 hour Holter: NSR, sinus brady down to 48, occasional PVCs & couplets, 8 beats NSVT; b. 30 day event monitor 2015: NSR with rare PVC.   Psoriasis    Syncope and collapse    Vitamin D deficiency    Wears dentures    full upper and lower    Past Surgical History:  Procedure Laterality Date   ABDOMINOPLASTY     tummy tuck ? year    BARIATRIC SURGERY  2001   CARDIAC CATHETERIZATION Left 04/16/2016   Procedure: Left Heart Cath and Coronary Angiography;  Surgeon: [name], MD;  Location: [facility];  Service: Cardiovascular;  Laterality: Left;   CHOLECYSTECTOMY  2001   COLONOSCOPY WITH PROPOFOL N/A 09/28/2018   Procedure: COLONOSCOPY WITH PROPOFOL;  Surgeon: Virgel Manifold, MD;  Location: [facility];  Service: Endoscopy;  Laterality: N/A;   ESOPHAGOGASTRODUODENOSCOPY (EGD) WITH PROPOFOL N/A 09/28/2018   Procedure: ESOPHAGOGASTRODUODENOSCOPY (EGD) WITH BIOPSY;  Surgeon: Virgel Manifold, MD;  Location: [facility];  Service: Endoscopy;  Laterality: N/A;   EXTERNAL FIXATION LEG Left 10/29/2020   Procedure: EXTERNAL FIXATION LEFT KNEE;  Surgeon: Altamese Strasburg, MD;  Location: [facility];  Service: Orthopedics;  Laterality: Left;   GALLBLADDER SURGERY     GASTRIC BYPASS  2001   GASTROPLASTY      Family Psychiatric History: Reviewed family psychiatric history from progress note on 10/17/2017  Family History:  Family History  Problem Relation Age of Onset   Heart attack Mother    Cancer Mother        pancreatitic 51   Early death Mother    Heart attack Father 91       MI   Early death Father    Heart disease Father    Heart attack Brother    Heart disease Brother    Arthritis Brother    Depression Brother    Diabetes Brother    Heart attack Maternal Grandmother    Cancer Maternal Grandmother        pancreatitic    Heart disease Maternal Grandmother    Lung cancer Maternal Grandmother    Pancreatic cancer Maternal  Grandmother    Cancer Maternal Uncle        pancreatitic    Cancer Paternal Grandmother        ? type    Diabetes Paternal Grandmother    Cancer Maternal Uncle        pancreatitic    Breast cancer Maternal Aunt    Cancer Maternal Aunt        GYN   Cancer Maternal Aunt        GYN    Social History: Reviewed social history from progress note on 10/17/2017 Social History   Socioeconomic History   Marital status: Widowed    Spouse name: Not on file   Number of children: 1  Years of education: assoc degree   Highest education level: Associate degree: occupational, Hotel manager, or vocational program  Occupational History   Not on file  Tobacco Use   Smoking status: Never   Smokeless tobacco: Never  Vaping Use   Vaping Use: Never used  Substance and Sexual Activity   Alcohol use: No    Alcohol/week: 0.0 standard drinks    Comment: holidays   Drug use: No   Sexual activity: Not Currently  Other Topics Concern   Not on file  Social History Narrative   Adopted daughter [name] [phone] (now lives in [place] going to [place] has apt there)   Significant other mac [phone], former husband died    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    Penicillins Hives, Shortness Of Breath and Rash    Has patient had a PCN reaction causing immediate rash, facial/tongue/throat swelling, SOB or lightheadedness with hypotension: Yes Has patient had a PCN reaction causing severe rash involving mucus membranes or skin necrosis: No Has patient had a  PCN reaction that required hospitalization No Has patient had a PCN reaction occurring within the last 10 years: No If all of the above answers are "NO", then may proceed with Cephalosporin use.    Bee Venom     Metabolic Disorder Labs: Lab Results  Component Value Date   HGBA1C 5.3 09/01/2018   No results found for: PROLACTIN Lab Results  Component Value Date   CHOL 114 12/07/2019   TRIG 63 12/07/2019   HDL 54 12/07/2019   CHOLHDL 2.1 12/07/2019   VLDL 13 12/07/2019   LDLCALC 47 12/07/2019   LDLCALC 95 09/01/2018   Lab Results  Component Value Date   TSH 3.915 12/07/2019   TSH 3.250 11/09/2019    Therapeutic Level Labs: No results found for: LITHIUM No results found for: VALPROATE No components found for:  CBMZ  Current Medications: Current Outpatient Medications  Medication Sig Dispense Refill   acetaminophen (TYLENOL) 500 MG tablet Take 2 tablets (1,000 mg total) by mouth every 8 (eight) hours. 90 tablet 0   albuterol (VENTOLIN HFA) 108 (90 Base) MCG/ACT inhaler INHALE 1-2 PUFFS INTO THE LUNGS AS NEEDED FOR WHEEZING. (Patient taking differently: Inhale 1-2 puffs into the lungs as needed for wheezing or shortness of breath.) 18 each 11   apixaban (ELIQUIS) 2.5 MG TABS tablet Take 1 tablet (2.5 mg total) by mouth 2 (two) times daily. 60 tablet 0   ascorbic acid (VITAMIN C) 1000 MG tablet Take 1 tablet (1,000 mg total) by mouth daily. 30 tablet 1   baclofen (LIORESAL) 10 MG tablet Take 10 mg by mouth 3 (three) times daily as needed for muscle spasms.     Biotin 10 MG CAPS Take 10 mg by mouth daily.     carvedilol (COREG) 6.25 MG tablet Take 1 tablet (6.25 mg total) by mouth 2 (two) times daily. 60 tablet 5   Cholecalciferol 125 MCG (5000 UT)  TABS Take1 tablet (128mg) by mouth daily. 30 tablet 3   diclofenac Sodium (VOLTAREN) 1 % GEL Apply topically.     docusate sodium (COLACE) 100 MG capsule Take 1 capsule (100 mg total) by mouth 2 (two) times daily. 30 capsule 0    gabapentin (NEURONTIN) 400 MG capsule Take 2-3 capsules (800-1,200 mg total) by mouth 3 (three) times daily. 807min the morning, 80082mt midday, and 1200m68m the evening     HYDROmorphone (DILAUDID) 4 MG tablet Take 1 tablet (4 mg total) by mouth every 6 (six) hours as needed for severe pain. 28 tablet 0   isosorbide mononitrate (IMDUR) 60 MG 24 hr tablet TAKE 1 TABLET BY MOUTH EVERY DAY 90 tablet 0   Magnesium Oxide 400 MG CAPS Take 1 capsule (400 mg total) by mouth daily. 30 capsule 6   methadone (DOLOPHINE) 10 MG tablet Limit 1-2 tablets by mouth 2-3 times per day if tolerated 180 tablet 0   mirtazapine (REMERON) 15 MG tablet TAKE 1 TABLET BY MOUTH EVERYDAY AT BEDTIME 90 tablet 0   naloxone (NARCAN) nasal spray 4 mg/0.1 mL 1 spray in either nostril at signs of opioid overdose.  May repeat in opposite nostril with new spray in 2-3 minutes if no or minimal response such as no improvement in breathing or responsiveness 2 each 1   nitroGLYCERIN (NITROSTAT) 0.4 MG SL tablet Place 0.4 mg under the tongue every 5 (five) minutes as needed for chest pain.     rosuvastatin (CRESTOR) 10 MG tablet TAKE 1 TABLET BY MOUTH EVERY DAY (Patient taking differently: Take 10 mg by mouth daily.) 90 tablet 3   No current facility-administered medications for this visit.     Musculoskeletal: Strength & Muscle Tone:  UTA Gait & Station:  Seated Patient leans: N/A  Psychiatric Specialty Exam: Review of Systems  Musculoskeletal:        Left LE S/P Fracture - improving  Psychiatric/Behavioral:  Negative for agitation, behavioral problems, confusion, decreased concentration, dysphoric mood, hallucinations, self-injury, sleep disturbance and suicidal ideas. The patient is not nervous/anxious and is not hyperactive.        Feels down due to fracture and limited mobility   All other systems reviewed and are negative.  Last menstrual period 02/18/2016.There is no height or weight on file to calculate BMI.  General  Appearance: Casual  Eye Contact:  Fair  Speech:  Clear and Coherent  Volume:  Normal  Mood:   feels down not depressed - adjustment problem due to her fracture and limited mobility  Affect:  Congruent  Thought Process:  Goal Directed and Descriptions of Associations: Intact  Orientation:  Full (Time, Place, and Person)  Thought Content: Logical   Suicidal Thoughts:  No  Homicidal Thoughts:  No  Memory:  Immediate;   Fair Recent;   Fair Remote;   Fair  Judgement:  Fair  Insight:  Fair  Psychomotor Activity:  Normal  Concentration:  Concentration: Fair and Attention Span: Fair  Recall:  FairAES CorporationKnowledge: Fair  Language: Fair  Akathisia:  No  Handed:  Right  AIMS (if indicated): done  Assets:  Communication Skills Desire for Improvement Housing Resilience Social Support  ADL's:  Intact  Cognition: WNL  Sleep:  Fair   Screenings: GAD-7    Flowsheet Row [facility] from  09/21/2015 in [facility]  Total GAD-7 Score _0 Mini-Mental    Flowsheet Row Office Visit from 10/30/2017 in [facility]  Total Score (max 30 points ) 27      PHQ2-9    Flowsheet Row Video Visit from 12/13/2020 in [facility] Office Visit from 10/22/2020 in [facility] Video Visit from 08/14/2020 in [facility] Video Visit from 02/15/2020 in [facility] [place] Office Visit from 07/26/2019 in [facility]  PHQ-2 Total Score 0 _1 PHQ-9 Total Score _2 Flowsheet Row Admission (Discharged) from 10/29/2020 in [facility] Office Visit from 10/22/2020 in  [facility] Video Visit from 08/14/2020 in [facility] No Risk No Risk No Risk        Assessment and Plan: DOREATHA OFFER is a 56 year old Caucasian female, widowed, has a history of PTSD, depression, anxiety, multiple medical problems including recent tibial plateau fracture status post ORIF, was evaluated by telemedicine today.  Patient reports being compliant on the mirtazapine however does struggle with situational stressors and will benefit from psychotherapy sessions.  Plan PTSD-stable Continue CBT  MDD in remission Mirtazapine 15 mg p.o. nightly  GAD-improving However since patient continues to have situational stressors recommended restarting psychotherapy session.  I have referred her to Ms. Christina Hussami -patient has been noncompliant with therapy recommendations in the past   Insomnia-improving Mirtazapine 15 mg p.o. nightly  Caffeine use disorder-improving Provided counseling  Follow-up in clinic in 6 to 8 weeks or sooner in person.  This note was generated in part or whole with voice recognition software. Voice recognition is usually quite accurate but there are transcription errors that can and very often do occur. I apologize for any typographical errors that were not detected and corrected.       Ursula Alert, MD 01/10/2021, 4:45 PM

## 2021-02-05 ENCOUNTER — Telehealth: Payer: Self-pay | Admitting: Internal Medicine

## 2021-02-05 NOTE — Telephone Encounter (Signed)
Copied from CRM [id number]. Topic: Medicare AWV >> Feb 05, 2021 10:20 AM Harris-Coley, [place] Gully wrote: Reason for CRM: LVM 02/05/21 to r/s AWV appt [facility].  Left message for patient to reschedule Annual Wellness Visit.  Please schedule with Nurse Health Advisor Denisa O'Brien-Blaney, LPN at [facility].  Please call [phone] ask for [facility]

## 2021-02-06 ENCOUNTER — Ambulatory Visit: Payer: Medicare Other

## 2021-02-13 ENCOUNTER — Other Ambulatory Visit (HOSPITAL_COMMUNITY): Payer: Self-pay

## 2021-02-15 ENCOUNTER — Other Ambulatory Visit: Payer: Self-pay | Admitting: Internal Medicine

## 2021-02-15 DIAGNOSIS — G4701 Insomnia due to medical condition: Secondary | ICD-10-CM

## 2021-02-19 ENCOUNTER — Other Ambulatory Visit: Payer: Self-pay

## 2021-02-19 ENCOUNTER — Telehealth (INDEPENDENT_AMBULATORY_CARE_PROVIDER_SITE_OTHER): Payer: Medicare Other | Admitting: Psychiatry

## 2021-02-19 ENCOUNTER — Encounter: Payer: Self-pay | Admitting: Psychiatry

## 2021-02-19 DIAGNOSIS — G4701 Insomnia due to medical condition: Secondary | ICD-10-CM

## 2021-02-19 DIAGNOSIS — I200 Unstable angina: Secondary | ICD-10-CM | POA: Diagnosis not present

## 2021-02-19 DIAGNOSIS — F4321 Adjustment disorder with depressed mood: Secondary | ICD-10-CM | POA: Diagnosis not present

## 2021-02-19 DIAGNOSIS — F1590 Other stimulant use, unspecified, uncomplicated: Secondary | ICD-10-CM

## 2021-02-19 DIAGNOSIS — F411 Generalized anxiety disorder: Secondary | ICD-10-CM | POA: Diagnosis not present

## 2021-02-19 DIAGNOSIS — F4310 Post-traumatic stress disorder, unspecified: Secondary | ICD-10-CM | POA: Diagnosis not present

## 2021-02-19 DIAGNOSIS — F3342 Major depressive disorder, recurrent, in full remission: Secondary | ICD-10-CM

## 2021-02-19 NOTE — Progress Notes (Signed)
Virtual Visit via Video Note  I connected with Cheyenne Gray on 02/19/21 at  3:00 PM EST by a video enabled telemedicine application and verified that I am speaking with the correct person using two identifiers.  Location Provider Location : [facility] Patient Location : Home  Participants: Patient , Provider    I discussed the limitations of evaluation and management by telemedicine and the availability of in person appointments. The patient expressed understanding and agreed to proceed.   I discussed the assessment and treatment plan with the patient. The patient was provided an opportunity to ask questions and all were answered. The patient agreed with the plan and demonstrated an understanding of the instructions.   The patient was advised to call back or seek an in-person evaluation if the symptoms worsen or if the condition fails to improve as anticipated.    [facility] MD OP Progress Note  02/20/2021 8:24 AM Cheyenne Gray  MRN:  [id number]  Chief Complaint:  Chief Complaint   Follow-up; Anxiety; Depression    HPI: Cheyenne Gray is a 55 year old Caucasian female, widowed, on disability, has a history of PTSD, MDD, GAD, insomnia, caffeine use disorder, history of prolonged QT syndrome, vitamin B12 deficiency, multiple other medical problems was evaluated by telemedicine today.  Patient today reports she is currently having a lot of difficulty adjusting with the current psychosocial stressors.  She does worry a lot about her current medical issues.  Patient had tibial plateau fracture status post ORIF on 10/29/2020.  She continues to need a walker to walk and is currently in physical therapy.  She hence is stuck at home all day and that does have an impact on her mood.  She often feels sad, struggles with motivation and reports she is bored.  Patient is currently compliant on the mirtazapine.  Reports sleep is overall good.  Denies any suicidality, homicidality or perceptual  disturbances.  She does have appointment with her therapist however that is in late January.  Patient is interested in starting intensive outpatient program in the meantime, we will refer her for the same.  Patient denies any other concerns today.  Visit Diagnosis:    ICD-10-CM   1. PTSD (post-traumatic stress disorder)  F43.10     2. GAD (generalized anxiety disorder)  F41.1     3. MDD (major depressive disorder), recurrent, in full remission ([place])  F33.42     4. Adjustment disorder with depressed mood  F43.21     5. Insomnia due to medical condition  G47.01    Caffeine, mood, pain    6. 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  Past trials of Paxil, Cymbalta, Valium, melatonin, trazodone, Ambien, Wellbutrin, Abilify  Past Medical History:  Past Medical History:  Diagnosis Date   (HFpEF) heart failure with preserved ejection fraction ([place])    a. Echo 2014: EF 65-70%, nl WM, mildly dilated LA, PASP nl; b. 12/2014 Echo: EF 65-70%, no rwma, mod septal hypertrophy w/o LVOT gradient or SAM; c. 07/2017 Echo: EF 55-60%, no rwma, mildly dil RV w/ nl syst fxn. Mildly dil RA. Dilated IVC w/ elevated CVP. Triv post effusion.   Anxiety    Asthma    Chronic pain    on methadone, managed by Dr. Primus Bravo   Concussion    hx of 4   Coronary artery disease, non-occlusive    a. LHC 1/18: proximal to  mid LAD 40% stenosed, mid LAD 30% stenosed, mid RCA 20% stenosed, distal RCA 20% stenosed, EF 55-65%, LVEDP normal   Depression    DJD (degenerative joint disease), multiple sites    History of shingles    Hypertension    Iron deficiency anemia    Long QT interval    Obesity    Palpitations    a. 24 hour Holter: NSR, sinus brady down to 48, occasional PVCs & couplets, 8 beats NSVT; b. 30 day event monitor 2015: NSR with rare PVC.   Psoriasis    Syncope and collapse    Vitamin D deficiency    Wears dentures     full upper and lower    Past Surgical History:  Procedure Laterality Date   ABDOMINOPLASTY     tummy tuck ? year    BARIATRIC SURGERY  2001   CARDIAC CATHETERIZATION Left 04/16/2016   Procedure: Left Heart Cath and Coronary Angiography;  Surgeon: [name], MD;  Location: [facility];  Service: Cardiovascular;  Laterality: Left;   CHOLECYSTECTOMY  2001   COLONOSCOPY WITH PROPOFOL N/A 09/28/2018   Procedure: COLONOSCOPY WITH PROPOFOL;  Surgeon: Virgel Manifold, MD;  Location: [facility];  Service: Endoscopy;  Laterality: N/A;   ESOPHAGOGASTRODUODENOSCOPY (EGD) WITH PROPOFOL N/A 09/28/2018   Procedure: ESOPHAGOGASTRODUODENOSCOPY (EGD) WITH BIOPSY;  Surgeon: Virgel Manifold, MD;  Location: [facility];  Service: Endoscopy;  Laterality: N/A;   EXTERNAL FIXATION LEG Left 10/29/2020   Procedure: EXTERNAL FIXATION LEFT KNEE;  Surgeon: Altamese Rockford, MD;  Location: [facility];  Service: Orthopedics;  Laterality: Left;   GALLBLADDER SURGERY     GASTRIC BYPASS  2001   GASTROPLASTY      Family Psychiatric History: Reviewed family psychiatric history from progress note on 10/17/2017  Family History:  Family History  Problem Relation Age of Onset   Heart attack Mother    Cancer Mother        pancreatitic 14   Early death Mother    Heart attack Father 65       MI   Early death Father    Heart disease Father    Heart attack Brother    Heart disease Brother    Arthritis Brother    Depression Brother    Diabetes Brother    Heart attack Maternal Grandmother    Cancer Maternal Grandmother        pancreatitic    Heart disease Maternal Grandmother    Lung cancer Maternal Grandmother    Pancreatic cancer Maternal Grandmother    Cancer Maternal Uncle        pancreatitic    Cancer Paternal Grandmother        ? type    Diabetes Paternal Grandmother    Cancer Maternal Uncle        pancreatitic    Breast cancer Maternal Aunt    Cancer Maternal Aunt         GYN   Cancer Maternal Aunt        GYN    Social History: Reviewed social history from progress note on 10/17/2017 Social History   Socioeconomic History   Marital status: Widowed    Spouse name: Not on file   Number of children: 1   Years of education: assoc degree   Highest education level: Associate degree: occupational, Hotel manager, or vocational program  Occupational History   Not on file  Tobacco Use   Smoking status: Never   Smokeless tobacco:  Never  Vaping Use   Vaping Use: Never used  Substance and Sexual Activity   Alcohol use: No    Alcohol/week: 0.0 standard drinks    Comment: holidays   Drug use: No   Sexual activity: Not Currently  Other Topics Concern   Not on file  Social History Narrative   Adopted daughter [name] [phone] (now lives in [place] going to [place] has apt there)   Significant other mac [phone], former husband died    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    Penicillins Hives, Shortness Of Breath and Rash    Has patient had a PCN reaction causing immediate rash, facial/tongue/throat swelling, SOB or lightheadedness with hypotension: Yes Has patient had a PCN reaction causing severe rash involving mucus membranes or skin necrosis: No Has patient had a PCN reaction that required hospitalization No Has patient had a PCN reaction occurring within the last 10 years: No If all of the above answers are "NO", then may proceed with Cephalosporin use.    Bee Venom     Metabolic Disorder Labs: Lab  Results  Component Value Date   HGBA1C 5.3 09/01/2018   No results found for: PROLACTIN Lab Results  Component Value Date   CHOL 114 12/07/2019   TRIG 63 12/07/2019   HDL 54 12/07/2019   CHOLHDL 2.1 12/07/2019   VLDL 13 12/07/2019   LDLCALC 47 12/07/2019   LDLCALC 95 09/01/2018   Lab Results  Component Value Date   TSH 3.915 12/07/2019   TSH 3.250 11/09/2019    Therapeutic Level Labs: No results found for: LITHIUM No results found for: VALPROATE No components found for:  CBMZ  Current Medications: Current Outpatient Medications  Medication Sig Dispense Refill   acetaminophen (TYLENOL) 500 MG tablet Take 2 tablets (1,000 mg total) by mouth every 8 (eight) hours. 90 tablet 0   albuterol (VENTOLIN HFA) 108 (90 Base) MCG/ACT inhaler INHALE 1-2 PUFFS INTO THE LUNGS AS NEEDED FOR WHEEZING. (Patient taking differently: Inhale 1-2 puffs into the lungs as needed for wheezing or shortness of breath.) 18 each 11   apixaban (ELIQUIS) 2.5 MG TABS tablet Take 1 tablet (2.5 mg total) by mouth 2 (two) times daily. 60 tablet 0   ascorbic acid (VITAMIN C) 1000 MG tablet Take 1 tablet (1,000 mg total) by mouth daily. 30 tablet 1   baclofen (LIORESAL) 10 MG tablet Take 10 mg by mouth 3 (three) times daily as needed for muscle spasms.     Biotin 10 MG CAPS Take 10 mg by mouth daily.     carvedilol (COREG) 6.25 MG tablet Take 1 tablet (6.25 mg total) by mouth 2 (two) times daily. 60 tablet 5   Cholecalciferol 125 MCG (5000 UT) TABS Take1 tablet (143mg) by mouth daily. 30 tablet 3   diclofenac Sodium (VOLTAREN) 1 % GEL Apply topically.     docusate sodium (COLACE) 100 MG capsule Take 1 capsule (100 mg total) by  mouth 2 (two) times daily. 30 capsule 0   gabapentin (NEURONTIN) 400 MG capsule Take 2-3 capsules (800-1,200 mg total) by mouth 3 (three) times daily. 871m in the morning, 8092mat midday, and 120024mn the evening     HYDROmorphone (DILAUDID) 4 MG tablet Take 1 tablet (4 mg total) by mouth  every 6 (six) hours as needed for severe pain. 28 tablet 0   isosorbide mononitrate (IMDUR) 60 MG 24 hr tablet TAKE 1 TABLET BY MOUTH EVERY DAY 90 tablet 0   Magnesium Oxide 400 MG CAPS Take 1 capsule (400 mg total) by mouth daily. 30 capsule 6   methadone (DOLOPHINE) 10 MG tablet Limit 1-2 tablets by mouth 2-3 times per day if tolerated 180 tablet 0   mirtazapine (REMERON) 15 MG tablet TAKE 1 TABLET BY MOUTH EVERYDAY AT BEDTIME 90 tablet 0   naloxone (NARCAN) nasal spray 4 mg/0.1 mL 1 spray in either nostril at signs of opioid overdose.  May repeat in opposite nostril with new spray in 2-3 minutes if no or minimal response such as no improvement in breathing or responsiveness 2 each 1   nitroGLYCERIN (NITROSTAT) 0.4 MG SL tablet Place 0.4 mg under the tongue every 5 (five) minutes as needed for chest pain.     rosuvastatin (CRESTOR) 10 MG tablet TAKE 1 TABLET BY MOUTH EVERY DAY (Patient taking differently: Take 10 mg by mouth daily.) 90 tablet 3   No current facility-administered medications for this visit.     Musculoskeletal: Strength & Muscle Tone:  UTA Gait & Station:  Seated Patient leans: N/A  Psychiatric Specialty Exam: Review of Systems  Psychiatric/Behavioral:  Positive for dysphoric mood. The patient is nervous/anxious.   All other systems reviewed and are negative.  Last menstrual period 02/18/2016.There is no height or weight on file to calculate BMI.  General Appearance: Casual  Eye Contact:  Fair  Speech:  Clear and Coherent  Volume:  Normal  Mood:  Anxious and Depressed  Affect:  Congruent  Thought Process:  Goal Directed and Descriptions of Associations: Intact  Orientation:  Full (Time, Place, and Person)  Thought Content: Logical   Suicidal Thoughts:  No  Homicidal Thoughts:  No  Memory:  Immediate;   Fair Recent;   Fair Remote;   Fair  Judgement:  Fair  Insight:  Fair  Psychomotor Activity:  Normal  Concentration:  Concentration: Fair and Attention Span:  Fair  Recall:  [employer] Knowledge: Fair  Language: Fair  Akathisia:  No  Handed:  Right  AIMS (if indicated): not done  Assets:  Communication Skills Desire for Improvement Housing Social Support  ADL's:  Intact  Cognition: WNL  Sleep:  Fair   Screenings: GAD-7    Flowsheet Row [facility] G[facility]-7 Score _0 Mini-Mental    [facility]  Total Score (max 30 points ) 27      PHQ2-9    Flowsheet Row [facility]  PHQ-2 Total Score 3 0 _0 PHQ-9 Total Score _1 Flowsheet Row Admission (Discharged) from 10/29/2020 in [facility] Visit from 10/22/2020 in [facility] Video Visit from 08/14/2020 in [facility] No Risk No Risk No Risk        Assessment and Plan: Cheyenne Gray is a 55 year old Caucasian female, widowed, has a history of PTSD, depression, multiple medical problems currently recovering from tibial plateau fracture status post ORIF, currently in physical therapy does struggle with her current psychosocial stressors, being isolated at home.  She will benefit from psychotherapy sessions.  Discussed plan as noted  below.  Plan PTSD-stable Continue CBT  MDD in remission Mirtazapine 15 mg p.o. nightly  GAD-unstable We will refer her for intensive outpatient program. Continue mirtazapine as prescribed Patient to continue CBT  Adjustment disorder with depressed mood-unstable Will refer for intensive outpatient program, have sent communication to Ms. Dellia Nims with [facility]  Insomnia-improving Mirtazapine 15 mg p.o. nightly  Caffeine use disorder-improving Provided counseling  Follow-up in clinic in 3 to 4 weeks or sooner in person.  This note was generated in part or whole with voice recognition software. Voice recognition is usually quite accurate but there are transcription errors that can and very often do occur. I apologize for any typographical errors that were not detected and corrected.     Ursula Alert, MD 02/20/2021, 8:24 AM

## 2021-02-20 ENCOUNTER — Telehealth (HOSPITAL_COMMUNITY): Payer: Self-pay | Admitting: Psychiatry

## 2021-02-20 NOTE — Telephone Encounter (Signed)
D:  Dr. Eappen referred pt to [facility].  A:  Placed call to orient pt, but there was no answer.  Left vm for pt to call the case manager back.  Informed Dr. Eappen. 

## 2021-02-25 ENCOUNTER — Encounter: Payer: Self-pay | Admitting: Internal Medicine

## 2021-02-26 ENCOUNTER — Other Ambulatory Visit: Payer: Self-pay

## 2021-02-26 DIAGNOSIS — G4701 Insomnia due to medical condition: Secondary | ICD-10-CM

## 2021-02-26 MED ORDER — GABAPENTIN 400 MG PO CAPS: 800.0000 mg | ORAL_CAPSULE | Freq: Three times a day (TID) | ORAL | 3 refills | Status: DC

## 2021-02-26 MED ORDER — ALBUTEROL SULFATE HFA 108 (90 BASE) MCG/ACT IN AERS: 1.0000 | INHALATION_SPRAY | RESPIRATORY_TRACT | 11 refills | Status: DC | PRN

## 2021-02-26 MED ORDER — MIRTAZAPINE 15 MG PO TABS: ORAL_TABLET | ORAL | 1 refills | Status: DC

## 2021-03-01 ENCOUNTER — Other Ambulatory Visit: Payer: Self-pay

## 2021-03-01 ENCOUNTER — Encounter: Payer: Self-pay | Admitting: Internal Medicine

## 2021-03-01 ENCOUNTER — Ambulatory Visit (INDEPENDENT_AMBULATORY_CARE_PROVIDER_SITE_OTHER): Payer: Medicare Other | Admitting: Internal Medicine

## 2021-03-01 VITALS — BP 124/80 | HR 64 | Temp 97.4°F | Ht 64.96 in | Wt 258.8 lb

## 2021-03-01 DIAGNOSIS — M549 Dorsalgia, unspecified: Secondary | ICD-10-CM | POA: Insufficient documentation

## 2021-03-01 DIAGNOSIS — Z1231 Encounter for screening mammogram for malignant neoplasm of breast: Secondary | ICD-10-CM | POA: Diagnosis not present

## 2021-03-01 DIAGNOSIS — R269 Unspecified abnormalities of gait and mobility: Secondary | ICD-10-CM | POA: Diagnosis not present

## 2021-03-01 DIAGNOSIS — D509 Iron deficiency anemia, unspecified: Secondary | ICD-10-CM

## 2021-03-01 DIAGNOSIS — Z23 Encounter for immunization: Secondary | ICD-10-CM

## 2021-03-01 DIAGNOSIS — R739 Hyperglycemia, unspecified: Secondary | ICD-10-CM

## 2021-03-01 DIAGNOSIS — R946 Abnormal results of thyroid function studies: Secondary | ICD-10-CM

## 2021-03-01 DIAGNOSIS — R770 Abnormality of albumin: Secondary | ICD-10-CM | POA: Diagnosis not present

## 2021-03-01 DIAGNOSIS — G8929 Other chronic pain: Secondary | ICD-10-CM | POA: Insufficient documentation

## 2021-03-01 DIAGNOSIS — T148XXA Other injury of unspecified body region, initial encounter: Secondary | ICD-10-CM

## 2021-03-01 DIAGNOSIS — Z1329 Encounter for screening for other suspected endocrine disorder: Secondary | ICD-10-CM

## 2021-03-01 DIAGNOSIS — E538 Deficiency of other specified B group vitamins: Secondary | ICD-10-CM

## 2021-03-01 DIAGNOSIS — Z13220 Encounter for screening for lipoid disorders: Secondary | ICD-10-CM

## 2021-03-01 DIAGNOSIS — E559 Vitamin D deficiency, unspecified: Secondary | ICD-10-CM

## 2021-03-01 MED ORDER — NITROGLYCERIN 0.4 MG SL SUBL: 0.4000 mg | SUBLINGUAL_TABLET | SUBLINGUAL | 2 refills | Status: DC | PRN

## 2021-03-01 MED ORDER — SANTYL 250 UNIT/GM EX OINT: 1.0000 "application " | TOPICAL_OINTMENT | Freq: Every day | CUTANEOUS | 0 refills | Status: DC

## 2021-03-01 MED ORDER — TETANUS-DIPHTH-ACELL PERTUSSIS 5-2.5-18.5 LF-MCG/0.5 IM SUSP: 0.5000 mL | Freq: Once | INTRAMUSCULAR | 0 refills | Status: AC

## 2021-03-01 MED ORDER — CYANOCOBALAMIN 1000 MCG/ML IJ SOLN
1000.0000 ug | INTRAMUSCULAR | Status: AC
  Administered 2021-03-01 – 2022-06-30 (×3): 1000 ug via INTRAMUSCULAR

## 2021-03-01 MED ORDER — MUPIROCIN 2 % EX OINT: 1.0000 "application " | TOPICAL_OINTMENT | Freq: Two times a day (BID) | CUTANEOUS | 0 refills | Status: DC
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
## 2021-03-01 NOTE — Patient Instructions (Addendum)
Dove or dial antibacterial body wash  Let me know about abdomen if not healing in 2 weeks wound clinic   [address] [place]  [place] [phone]   Call cardiology for appointment Ask about cardiac rehab  Dr. Mariah Milling MD No Physician   Primary Contact Information  Phone Fax E-mail Address  [phone] [email or web address] [address]   [address]   [place] [address]    Call GI when ready for repeat colonoscopy  MD Physician   Primary Contact Information  Phone Fax E-mail Address  [phone] Not available [address] [place] [address]

## 2021-03-02 LAB — HEPATIC FUNCTION PANEL
AG Ratio: 1.1 (calc) (ref 1.0–2.5)
ALT: 5 U/L — ABNORMAL LOW (ref 6–29)
AST: 13 U/L (ref 10–35)
Albumin: 3.8 g/dL (ref 3.6–5.1)
Alkaline phosphatase (APISO): 110 U/L (ref 37–153)
Bilirubin, Direct: 0.2 mg/dL (ref 0.0–0.2)
Globulin: 3.5 g/dL (calc) (ref 1.9–3.7)
Indirect Bilirubin: 0.5 mg/dL (calc) (ref 0.2–1.2)
Total Bilirubin: 0.7 mg/dL (ref 0.2–1.2)
Total Protein: 7.3 g/dL (ref 6.1–8.1)

## 2021-03-02 LAB — CBC WITH DIFFERENTIAL/PLATELET
Absolute Monocytes: 589 cells/uL (ref 200–950)
Basophils Absolute: 71 cells/uL (ref 0–200)
Basophils Relative: 1 %
Eosinophils Absolute: 199 cells/uL (ref 15–500)
Eosinophils Relative: 2.8 %
HCT: 38.3 % (ref 35.0–45.0)
Hemoglobin: 12.6 g/dL (ref 11.7–15.5)
Lymphs Abs: 2329 cells/uL (ref 850–3900)
MCH: 28.3 pg (ref 27.0–33.0)
MCHC: 32.9 g/dL (ref 32.0–36.0)
MCV: 85.9 fL (ref 80.0–100.0)
MPV: 11.4 fL (ref 7.5–12.5)
Monocytes Relative: 8.3 %
Neutro Abs: 3912 cells/uL (ref 1500–7800)
Neutrophils Relative %: 55.1 %
Platelets: 265 10*3/uL (ref 140–400)
RBC: 4.46 10*6/uL (ref 3.80–5.10)
RDW: 12.7 % (ref 11.0–15.0)
Total Lymphocyte: 32.8 %
WBC: 7.1 10*3/uL (ref 3.8–10.8)

## 2021-03-02 LAB — LIPID PANEL
Cholesterol: 122 mg/dL (ref <200)
HDL: 43 mg/dL — ABNORMAL LOW (ref >=50)
LDL Cholesterol (Calc): 62 mg/dL (calc)
Non-HDL Cholesterol (Calc): 79 mg/dL (calc) (ref <130)
Total CHOL/HDL Ratio: 2.8 (calc) (ref <5.0)
Triglycerides: 86 mg/dL (ref <150)

## 2021-03-02 LAB — HEMOGLOBIN A1C
Hgb A1c MFr Bld: 5.1 % of total Hgb (ref <5.7)
Mean Plasma Glucose: 100 mg/dL
eAG (mmol/L): 5.5 mmol/L

## 2021-03-02 LAB — IRON,TIBC AND FERRITIN PANEL
%SAT: 18 % (calc) (ref 16–45)
Ferritin: 38 ng/mL (ref 16–232)
Iron: 64 ug/dL (ref 45–160)
TIBC: 348 mcg/dL (calc) (ref 250–450)

## 2021-03-02 LAB — TSH: TSH: 5.22 mIU/L — ABNORMAL HIGH

## 2021-03-04 NOTE — Addendum Note (Signed)
Addended by: Quentin Ore on: 03/04/2021 10:28 AM   Modules accepted: Orders

## 2021-03-06 ENCOUNTER — Encounter: Payer: Self-pay | Admitting: Internal Medicine

## 2021-03-06 MED ORDER — CHOLECALCIFEROL 1.25 MG (50000 UT) PO CAPS: 50000.0000 [IU] | ORAL_CAPSULE | ORAL | 1 refills | Status: DC

## 2021-03-06 NOTE — Addendum Note (Signed)
Addended by: Quentin Ore on: 03/06/2021 04:53 PM   Modules accepted: Orders

## 2021-03-07 ENCOUNTER — Telehealth: Payer: Self-pay

## 2021-03-07 NOTE — Telephone Encounter (Signed)
Pt returning call

## 2021-03-07 NOTE — Telephone Encounter (Signed)
-----   Message from Bevelyn Buckles, MD sent at 03/04/2021 10:23 AM EST ----- Tsh elevated  -repeat thyroid labs in 6-8 weeks call pt and schedule orders in thank you  Liver enzymes normal  Cholesterol ok  Hdl good cholesterol slightly low goal 50 for womanwas there likely needs more exercise when able Blood cts normal  A1c no prediabetes Iron normal

## 2021-03-08 ENCOUNTER — Other Ambulatory Visit: Payer: Self-pay

## 2021-03-08 DIAGNOSIS — I2510 Atherosclerotic heart disease of native coronary artery without angina pectoris: Secondary | ICD-10-CM

## 2021-03-08 DIAGNOSIS — R55 Syncope and collapse: Secondary | ICD-10-CM

## 2021-03-08 MED ORDER — ROSUVASTATIN CALCIUM 10 MG PO TABS: 10.0000 mg | ORAL_TABLET | Freq: Every day | ORAL | 0 refills | Status: DC

## 2021-03-08 MED ORDER — CARVEDILOL 6.25 MG PO TABS
6.2500 mg | ORAL_TABLET | Freq: Two times a day (BID) | ORAL | 0 refills | Status: DC
Start: 2021-03-08 — End: 2021-06-25

## 2021-03-08 MED ORDER — ISOSORBIDE MONONITRATE ER 60 MG PO TB24: 60.0000 mg | ORAL_TABLET | Freq: Every day | ORAL | 0 refills | Status: DC

## 2021-03-14 ENCOUNTER — Other Ambulatory Visit: Payer: Self-pay | Admitting: Family

## 2021-03-14 DIAGNOSIS — I2510 Atherosclerotic heart disease of native coronary artery without angina pectoris: Secondary | ICD-10-CM

## 2021-03-14 DIAGNOSIS — R55 Syncope and collapse: Secondary | ICD-10-CM

## 2021-03-15 ENCOUNTER — Ambulatory Visit: Payer: Medicare Other | Admitting: Internal Medicine

## 2021-03-18 ENCOUNTER — Encounter: Payer: Self-pay | Admitting: Oncology

## 2021-03-19 ENCOUNTER — Ambulatory Visit: Payer: Medicare Other | Admitting: Psychiatry

## 2021-03-19 DIAGNOSIS — Z4789 Encounter for other orthopedic aftercare: Secondary | ICD-10-CM | POA: Diagnosis not present

## 2021-03-19 DIAGNOSIS — R262 Difficulty in walking, not elsewhere classified: Secondary | ICD-10-CM | POA: Diagnosis not present

## 2021-03-19 DIAGNOSIS — M25562 Pain in left knee: Secondary | ICD-10-CM | POA: Diagnosis not present

## 2021-03-19 DIAGNOSIS — M25662 Stiffness of left knee, not elsewhere classified: Secondary | ICD-10-CM | POA: Diagnosis not present

## 2021-03-28 DIAGNOSIS — M25662 Stiffness of left knee, not elsewhere classified: Secondary | ICD-10-CM | POA: Diagnosis not present

## 2021-03-28 DIAGNOSIS — Z4789 Encounter for other orthopedic aftercare: Secondary | ICD-10-CM | POA: Diagnosis not present

## 2021-03-28 DIAGNOSIS — R262 Difficulty in walking, not elsewhere classified: Secondary | ICD-10-CM | POA: Diagnosis not present

## 2021-03-28 DIAGNOSIS — M25562 Pain in left knee: Secondary | ICD-10-CM | POA: Diagnosis not present

## 2021-04-02 ENCOUNTER — Encounter: Payer: Self-pay | Admitting: Internal Medicine

## 2021-04-02 DIAGNOSIS — M25662 Stiffness of left knee, not elsewhere classified: Secondary | ICD-10-CM | POA: Diagnosis not present

## 2021-04-02 DIAGNOSIS — R262 Difficulty in walking, not elsewhere classified: Secondary | ICD-10-CM | POA: Diagnosis not present

## 2021-04-02 DIAGNOSIS — Z4789 Encounter for other orthopedic aftercare: Secondary | ICD-10-CM | POA: Diagnosis not present

## 2021-04-02 DIAGNOSIS — M25562 Pain in left knee: Secondary | ICD-10-CM | POA: Diagnosis not present

## 2021-04-04 DIAGNOSIS — R262 Difficulty in walking, not elsewhere classified: Secondary | ICD-10-CM | POA: Diagnosis not present

## 2021-04-04 DIAGNOSIS — Z4789 Encounter for other orthopedic aftercare: Secondary | ICD-10-CM | POA: Diagnosis not present

## 2021-04-04 DIAGNOSIS — M25562 Pain in left knee: Secondary | ICD-10-CM | POA: Diagnosis not present

## 2021-04-04 DIAGNOSIS — M25662 Stiffness of left knee, not elsewhere classified: Secondary | ICD-10-CM | POA: Diagnosis not present

## 2021-04-09 DIAGNOSIS — R262 Difficulty in walking, not elsewhere classified: Secondary | ICD-10-CM | POA: Diagnosis not present

## 2021-04-09 DIAGNOSIS — M25562 Pain in left knee: Secondary | ICD-10-CM | POA: Diagnosis not present

## 2021-04-09 DIAGNOSIS — Z4789 Encounter for other orthopedic aftercare: Secondary | ICD-10-CM | POA: Diagnosis not present

## 2021-04-09 DIAGNOSIS — M25662 Stiffness of left knee, not elsewhere classified: Secondary | ICD-10-CM | POA: Diagnosis not present

## 2021-04-10 ENCOUNTER — Ambulatory Visit: Payer: Medicare Other | Admitting: Licensed Clinical Social Worker

## 2021-04-10 ENCOUNTER — Encounter: Payer: Self-pay | Admitting: Internal Medicine

## 2021-04-10 ENCOUNTER — Encounter: Payer: Self-pay | Admitting: Cardiology

## 2021-04-11 DIAGNOSIS — M25662 Stiffness of left knee, not elsewhere classified: Secondary | ICD-10-CM | POA: Diagnosis not present

## 2021-04-11 DIAGNOSIS — Z4789 Encounter for other orthopedic aftercare: Secondary | ICD-10-CM | POA: Diagnosis not present

## 2021-04-11 DIAGNOSIS — M25562 Pain in left knee: Secondary | ICD-10-CM | POA: Diagnosis not present

## 2021-04-11 DIAGNOSIS — R262 Difficulty in walking, not elsewhere classified: Secondary | ICD-10-CM | POA: Diagnosis not present

## 2021-04-11 NOTE — Telephone Encounter (Signed)
Please advise, does Patient need to be scheduled for an appointment with you or does she need a referral to psychiatry?

## 2021-04-12 ENCOUNTER — Other Ambulatory Visit: Payer: Self-pay | Admitting: Internal Medicine

## 2021-04-12 ENCOUNTER — Other Ambulatory Visit: Payer: Self-pay | Admitting: Cardiovascular Disease

## 2021-04-12 DIAGNOSIS — G4701 Insomnia due to medical condition: Secondary | ICD-10-CM

## 2021-04-12 NOTE — Telephone Encounter (Signed)
Needs appointment with primary cardiologist for refills. Overdue for F/U-cancelled last scheduled office visit. Thank you!

## 2021-04-15 DIAGNOSIS — Z4789 Encounter for other orthopedic aftercare: Secondary | ICD-10-CM | POA: Diagnosis not present

## 2021-04-15 DIAGNOSIS — M25662 Stiffness of left knee, not elsewhere classified: Secondary | ICD-10-CM | POA: Diagnosis not present

## 2021-04-15 DIAGNOSIS — M25562 Pain in left knee: Secondary | ICD-10-CM | POA: Diagnosis not present

## 2021-04-15 DIAGNOSIS — R262 Difficulty in walking, not elsewhere classified: Secondary | ICD-10-CM | POA: Diagnosis not present

## 2021-04-15 NOTE — Telephone Encounter (Signed)
Attempted to schedule no ans no vm  

## 2021-04-18 ENCOUNTER — Other Ambulatory Visit: Payer: Self-pay

## 2021-04-18 ENCOUNTER — Other Ambulatory Visit (INDEPENDENT_AMBULATORY_CARE_PROVIDER_SITE_OTHER): Payer: Medicare Other

## 2021-04-18 DIAGNOSIS — R946 Abnormal results of thyroid function studies: Secondary | ICD-10-CM

## 2021-04-18 DIAGNOSIS — M25662 Stiffness of left knee, not elsewhere classified: Secondary | ICD-10-CM | POA: Diagnosis not present

## 2021-04-18 DIAGNOSIS — Z4789 Encounter for other orthopedic aftercare: Secondary | ICD-10-CM | POA: Diagnosis not present

## 2021-04-18 DIAGNOSIS — R262 Difficulty in walking, not elsewhere classified: Secondary | ICD-10-CM | POA: Diagnosis not present

## 2021-04-18 DIAGNOSIS — M25562 Pain in left knee: Secondary | ICD-10-CM | POA: Diagnosis not present

## 2021-04-18 LAB — TSH: TSH: 3.19 u[IU]/mL (ref 0.35–5.50)

## 2021-04-18 LAB — T3, FREE: T3, Free: 3.4 pg/mL (ref 2.3–4.2)

## 2021-04-18 LAB — T4, FREE: Free T4: 0.72 ng/dL (ref 0.60–1.60)

## 2021-04-19 LAB — THYROID PEROXIDASE ANTIBODY: Thyroperoxidase Ab SerPl-aCnc: <1 IU/mL (ref <9)

## 2021-04-24 ENCOUNTER — Ambulatory Visit: Payer: Medicare Other | Admitting: Cardiology

## 2021-04-27 ENCOUNTER — Other Ambulatory Visit: Payer: Self-pay | Admitting: Internal Medicine

## 2021-05-01 DIAGNOSIS — R262 Difficulty in walking, not elsewhere classified: Secondary | ICD-10-CM | POA: Diagnosis not present

## 2021-05-01 DIAGNOSIS — M25662 Stiffness of left knee, not elsewhere classified: Secondary | ICD-10-CM | POA: Diagnosis not present

## 2021-05-01 DIAGNOSIS — M25562 Pain in left knee: Secondary | ICD-10-CM | POA: Diagnosis not present

## 2021-05-01 DIAGNOSIS — Z4789 Encounter for other orthopedic aftercare: Secondary | ICD-10-CM | POA: Diagnosis not present

## 2021-05-06 ENCOUNTER — Encounter: Payer: Self-pay | Admitting: Oncology

## 2021-05-06 DIAGNOSIS — M25662 Stiffness of left knee, not elsewhere classified: Secondary | ICD-10-CM | POA: Diagnosis not present

## 2021-05-06 DIAGNOSIS — R262 Difficulty in walking, not elsewhere classified: Secondary | ICD-10-CM | POA: Diagnosis not present

## 2021-05-06 DIAGNOSIS — M25562 Pain in left knee: Secondary | ICD-10-CM | POA: Diagnosis not present

## 2021-05-06 DIAGNOSIS — Z4789 Encounter for other orthopedic aftercare: Secondary | ICD-10-CM | POA: Diagnosis not present

## 2021-05-08 ENCOUNTER — Ambulatory Visit: Payer: Medicare Other | Admitting: Cardiology

## 2021-05-14 ENCOUNTER — Other Ambulatory Visit: Payer: Self-pay

## 2021-05-14 ENCOUNTER — Ambulatory Visit (INDEPENDENT_AMBULATORY_CARE_PROVIDER_SITE_OTHER): Payer: Medicare Other | Admitting: Psychiatry

## 2021-05-14 ENCOUNTER — Encounter: Payer: Self-pay | Admitting: Psychiatry

## 2021-05-14 VITALS — BP 174/95 | HR 65 | Temp 97.9°F | Wt 260.8 lb

## 2021-05-14 DIAGNOSIS — F331 Major depressive disorder, recurrent, moderate: Secondary | ICD-10-CM | POA: Diagnosis not present

## 2021-05-14 DIAGNOSIS — F1590 Other stimulant use, unspecified, uncomplicated: Secondary | ICD-10-CM

## 2021-05-14 DIAGNOSIS — F4310 Post-traumatic stress disorder, unspecified: Secondary | ICD-10-CM | POA: Diagnosis not present

## 2021-05-14 DIAGNOSIS — F411 Generalized anxiety disorder: Secondary | ICD-10-CM | POA: Diagnosis not present

## 2021-05-14 DIAGNOSIS — G4701 Insomnia due to medical condition: Secondary | ICD-10-CM

## 2021-05-14 MED ORDER — LAMOTRIGINE 25 MG PO TABS: 25.0000 mg | ORAL_TABLET | Freq: Every day | ORAL | 0 refills | Status: DC

## 2021-05-14 NOTE — Progress Notes (Signed)
[facility] MD OP Progress Note  05/14/2021 11:20 AM Cheyenne Gray  MRN:  [id number]  Chief Complaint:  Chief Complaint  Patient presents with   Follow-up: 56 year old Caucasian female, widowed, has a history of PTSD, MDD, GAD, status post recent LE surgery, presented for medication management.   HPI: Cheyenne Gray is a 56 year old Caucasian female, widowed, on disability, has a history of PTSD, MDD, GAD, insomnia, caffeine use disorder, history of prolonged QT syndrome, vitamin B12 deficiency, multiple other medical problems including recent surgery of left lower extremity, presented in person for medication management.  Patient today reports she continues to struggle with her mood.  Reports she often feels sad, lacks motivation, struggles with anhedonia as well as likes to be alone.  Patient reports she continues to struggle with her medical problems, status post ORIF on 10/29/2020.  Continues to be in physical therapy a couple of times a week.  Has started walking with a cane although she continues to have pain and physical limitations from the same.  Patient reports she often has to push herself to get out of the house or interact with her neighbor.  Patient is currently compliant on her mirtazapine.  Although she is on the mirtazapine patient reports she continues to struggle with her appetite.  She also has financial issues which also affect her ability to eat what she wants.  Reports sleep as good.  Patient with multiple psychosocial stressors including relationship struggles with her daughter, as well as her dog dying recently.  Patient has started psychotherapy sessions, family therapy with her daughter.  That has triggered a lot of emotional distress.  She does have upcoming appointment with our therapist here at our practice-Ms. Christina-agrees to keep it.  Patient was referred for intensive outpatient program last visit however she has been noncompliant.  Denies suicidality, homicidality  or perceptual disturbances.  Patient denies any other concerns today.    Visit Diagnosis:    ICD-10-CM   1. PTSD (post-traumatic stress disorder)  F43.10     2. GAD (generalized anxiety disorder)  F41.1 lamoTRIgine (LAMICTAL) 25 MG tablet    3. MDD (major depressive disorder), recurrent episode, moderate (HCC)  F33.1 lamoTRIgine (LAMICTAL) 25 MG tablet    4. Insomnia due to medical condition  G47.01    mood, pain, caffeine use    5. 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  Past trials of Paxil, Cymbalta, Valium, melatonin, trazodone, Ambien, Wellbutrin, Abilify.  Past Medical History:  Past Medical History:  Diagnosis Date   (HFpEF) heart failure with preserved ejection fraction ([place])    a. Echo 2014: EF 65-70%, nl WM, mildly dilated LA, PASP nl; b. 12/2014 Echo: EF 65-70%, no rwma, mod septal hypertrophy w/o LVOT gradient or SAM; c. 07/2017 Echo: EF 55-60%, no rwma, mildly dil RV w/ nl syst fxn. Mildly dil RA. Dilated IVC w/ elevated CVP. Triv post effusion.   Anxiety    Asthma    Chronic pain    on methadone, managed by Dr. Primus Bravo   Concussion    hx of 4   Coronary artery disease, non-occlusive    a. LHC 1/18: proximal to mid LAD 40% stenosed, mid LAD 30% stenosed, mid RCA 20% stenosed, distal RCA 20% stenosed, EF 55-65%, LVEDP normal   Depression    DJD (degenerative joint disease), multiple sites    History of shingles    Hypertension  Iron deficiency anemia    Long QT interval    Obesity    Palpitations    a. 24 hour Holter: NSR, sinus brady down to 48, occasional PVCs & couplets, 8 beats NSVT; b. 30 day event monitor 2015: NSR with rare PVC.   Psoriasis    Syncope and collapse    Vitamin D deficiency    Wears dentures    full upper and lower    Past Surgical History:  Procedure Laterality Date   ABDOMINOPLASTY     tummy tuck ? year    BARIATRIC SURGERY  2001   CARDIAC  CATHETERIZATION Left 04/16/2016   Procedure: Left Heart Cath and Coronary Angiography;  Surgeon: [name], MD;  Location: [facility];  Service: Cardiovascular;  Laterality: Left;   CHOLECYSTECTOMY  2001   COLONOSCOPY WITH PROPOFOL N/A 09/28/2018   Procedure: COLONOSCOPY WITH PROPOFOL;  Surgeon: Virgel Manifold, MD;  Location: [facility];  Service: Endoscopy;  Laterality: N/A;   ESOPHAGOGASTRODUODENOSCOPY (EGD) WITH PROPOFOL N/A 09/28/2018   Procedure: ESOPHAGOGASTRODUODENOSCOPY (EGD) WITH BIOPSY;  Surgeon: Virgel Manifold, MD;  Location: [facility];  Service: Endoscopy;  Laterality: N/A;   EXTERNAL FIXATION LEG Left 10/29/2020   Procedure: EXTERNAL FIXATION LEFT KNEE;  Surgeon: Altamese Sedgwick, MD;  Location: [facility];  Service: Orthopedics;  Laterality: Left;   GALLBLADDER SURGERY     GASTRIC BYPASS  2001   GASTROPLASTY      Family Psychiatric History: Reviewed family psychiatric history from progress note on 10/17/2017.  Family History:  Family History  Problem Relation Age of Onset   Heart attack Mother    Cancer Mother        pancreatitic 70   Early death Mother    Heart attack Father 24       MI   Early death Father    Heart disease Father    Heart attack Brother    Heart disease Brother    Arthritis Brother    Depression Brother    Diabetes Brother    Heart attack Maternal Grandmother    Cancer Maternal Grandmother        pancreatitic    Heart disease Maternal Grandmother    Lung cancer Maternal Grandmother    Pancreatic cancer Maternal Grandmother    Cancer Maternal Uncle        pancreatitic    Cancer Paternal Grandmother        ? type    Diabetes Paternal Grandmother    Cancer Maternal Uncle        pancreatitic    Breast cancer Maternal Aunt    Cancer Maternal Aunt        GYN   Cancer Maternal Aunt        GYN    Social History: Reviewed social history from progress note on 10/17/2017. Social History   Socioeconomic  History   Marital status: Widowed    Spouse name: Not on file   Number of children: 1   Years of education: assoc degree   Highest education level: Associate degree: occupational, Hotel manager, or vocational program  Occupational History   Not on file  Tobacco Use   Smoking status: Never   Smokeless tobacco: Never  Vaping Use   Vaping Use: Never used  Substance and Sexual Activity   Alcohol use: No    Alcohol/week: 0.0 standard drinks    Comment: holidays   Drug use: No   Sexual activity: Not Currently  Other  Topics Concern   Not on file  Social History Narrative   Adopted daughter [name] [phone] (now lives in [place] going to [place] state has apt there)   Significant other mac [phone], former husband died    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    Penicillins Hives, Shortness Of Breath and Rash    Has patient had a PCN reaction causing immediate rash, facial/tongue/throat swelling, SOB or lightheadedness with hypotension: Yes Has patient had a PCN reaction causing severe rash involving mucus membranes or skin necrosis: No Has patient had a PCN reaction that required hospitalization No Has patient had a PCN reaction occurring within the last 10 years: No If all of the above answers are "NO", then may proceed with Cephalosporin use.    Bee Venom     Metabolic Disorder Labs: Lab Results  Component Value Date   HGBA1C 5.1 03/01/2021   MPG 100 03/01/2021   No results found for: PROLACTIN Lab Results  Component Value Date    CHOL 122 03/01/2021   TRIG 86 03/01/2021   HDL 43 (L) 03/01/2021   CHOLHDL 2.8 03/01/2021   VLDL 13 12/07/2019   LDLCALC 62 03/01/2021   LDLCALC 47 12/07/2019   Lab Results  Component Value Date   TSH 3.19 04/18/2021   TSH 5.22 (H) 03/01/2021    Therapeutic Level Labs: No results found for: LITHIUM No results found for: VALPROATE No components found for:  CBMZ  Current Medications: Current Outpatient Medications  Medication Sig Dispense Refill   albuterol (VENTOLIN HFA) 108 (90 Base) MCG/ACT inhaler Inhale 1-2 puffs into the lungs as needed for wheezing. 18 each 11   baclofen (LIORESAL) 10 MG tablet Take 10 mg by mouth 3 (three) times daily as needed for muscle spasms.     Biotin 10 MG CAPS Take 10 mg by mouth daily.     carvedilol (COREG) 6.25 MG tablet Take 1 tablet (6.25 mg total) by mouth 2 (two) times daily. PLEASE SCHEDULE OFFICE VISIT FOR FURTHER REFILLS. THANK YOU! 60 tablet 0   Cholecalciferol 1.25 MG (50000 UT) capsule Take 1 capsule (50,000 Units total) by mouth once a week. D3 13 capsule 1   collagenase (SANTYL) ointment Apply 1 application topically daily. X 1 week at night left abdomen 15 g 0   diclofenac Sodium (VOLTAREN) 1 % GEL Apply topically.     gabapentin (NEURONTIN) 400 MG capsule LIMIT 2 TABLETS IN THE A.M. AND MIDDAY AND 3 TABLETS EACH EVENING 630 capsule 3   isosorbide mononitrate (IMDUR) 60 MG 24 hr tablet TAKE 1 TABLET BY MOUTH EVERY DAY 90 tablet 0   lamoTRIgine (LAMICTAL) 25 MG tablet Take 1 tablet (25 mg total) by mouth daily. 30 tablet 0   Magnesium Oxide 400 MG CAPS Take 1 capsule (400 mg total) by mouth daily. 30 capsule 6   methadone (DOLOPHINE) 10 MG tablet Limit 1-2 tablets by mouth 2-3 times per day if tolerated 180 tablet  0   mirtazapine (REMERON) 15 MG tablet TAKE 1 TABLET BY MOUTH EVERYDAY AT BEDTIME 90 tablet 1   mupirocin ointment (BACTROBAN) 2 % Apply 1 application topically 2 (two) times daily. Left 2x per day 30 g 0   nitroGLYCERIN  (NITROSTAT) 0.4 MG SL tablet PLACE 1 TABLET UNDER THE TONGUE EVERY 5 MINUTES AS NEEDED FOR CHEST PAIN. AFTER 2ND DOSE CALL 911 25 tablet 2   rosuvastatin (CRESTOR) 10 MG tablet TAKE 1 TABLET BY MOUTH EVERY DAY 90 tablet 0   apixaban (ELIQUIS) 2.5 MG TABS tablet Take 1 tablet (2.5 mg total) by mouth 2 (two) times daily. (Patient not taking: Reported on 03/01/2021) 60 tablet 0   Current Facility-Administered Medications  Medication Dose Route Frequency Provider Last Rate Last Admin   cyanocobalamin ((VITAMIN B-12)) injection 1,000 mcg  1,000 mcg Intramuscular Q30 days McLean-Scocuzza, Nino Glow, MD   1,000 mcg at 03/01/21 1520     Musculoskeletal: Strength & Muscle Tone: within normal limits Gait & Station:  Walks with cane Patient leans: N/A  Psychiatric Specialty Exam: Review of Systems  Musculoskeletal:  Positive for back pain.  Psychiatric/Behavioral:  Positive for decreased concentration and dysphoric mood.   All other systems reviewed and are negative.  Blood pressure (!) 174/95, pulse 65, temperature 97.9 F (36.6 C), temperature source Temporal, weight 260 lb 12.8 oz (118.3 kg), last menstrual period 02/18/2016.Body mass index is 43.45 kg/m.  General Appearance: Casual  Eye Contact:  Good  Speech:  Clear and Coherent  Volume:  Normal  Mood:  Depressed  Affect:  Congruent  Thought Process:  Goal Directed and Descriptions of Associations: Intact  Orientation:  Full (Time, Place, and Person)  Thought Content: Logical   Suicidal Thoughts:  No  Homicidal Thoughts:  No  Memory:  Immediate;   Fair Recent;   Fair Remote;   Fair  Judgement:  Fair  Insight:  Fair  Psychomotor Activity:  Normal  Concentration:  Concentration: Fair and Attention Span: Fair  Recall:  [employer] of Knowledge: Fair  Language: Fair  Akathisia:  No  Handed:  Right  AIMS (if indicated): done,0  Assets:  Communication Skills Desire for Improvement Housing  ADL's:  Intact  Cognition: WNL  Sleep:   Fair   Screenings: GAD-7    Flowsheet Row Video Visit from 12/13/2020 in [facility] Video Visit from 02/15/2020 in [facility] Office Visit from 07/26/2019 in [facility] Office Visit from 09/21/2015 in [facility]  Total GAD-7 Score _0 Mini-Mental    [facility] Office Visit from 10/30/2017 in [facility]  Total Score (max 30 points ) 27      PHQ2-9    [place] Visit from 05/14/2021 in [facility] Video Visit from 02/19/2021 in [facility] Video Visit from 12/13/2020 in [facility] Visit from 10/22/2020 in [facility] Video Visit from 08/14/2020 in [facility]  PHQ-2 Total Score 3 3 0 3 2  PHQ-9 Total Score _1 [facility] Office Visit from 05/14/2021 in [facility] Admission (Discharged) from 10/29/2020 in [facility] Office Visit from 10/22/2020 in [facility] No Risk No Risk No Risk        Assessment  and Plan: Cheyenne Gray is a 56 year old Caucasian female, widowed, has a history of PTSD, depression, multiple medical problems including s/p tibial plateau fracture status post ORIF , currently in physical therapy, continues to struggle with depression, will benefit from the following plan.  Plan PTSD-stable Continue CBT  MDD-unstable Start Lamictal 25 mg p.o. daily Continue mirtazapine 15 mg p.o. nightly Patient referred for CBT, motivated to start her therapy sessions next week. Also provided information for [employer], patient to reach out to them for support groups.  GAD-improving Patient has been referred for CBT Mirtazapine  as prescribed  Insomnia-stable Mirtazapine 15 mg p.o. nightly  Caffeine use disorder-grooming Continue to monitor closely  Patient with elevated blood pressure reading in session today-has upcoming appointment with cardiology.  Agrees to keep it.  Follow-up in clinic in 2 weeks or sooner if needed.  Collaboration of Care: Collaboration of Care: Referral or follow-up with counselor/therapist AEB encouraged to keep her appointment with therapist next week and Other provided information for [facility] as noted above for support groups, also encouraged to keep her cardiology visit for elevated blood pressure next week.  Patient/Guardian was advised Release of Information must be obtained prior to any record release in order to collaborate their care with an outside provider. Patient/Guardian was advised if they have not already done so to contact the registration department to sign all necessary forms in order for [place] to release information regarding their care.   Consent: Patient/Guardian gives verbal consent for treatment and assignment of benefits for services provided during this visit. Patient/Guardian expressed understanding and agreed to proceed.   This note was generated in part or whole with voice recognition software. Voice recognition is usually quite accurate but there are transcription errors that can and very often do occur. I apologize for any typographical errors that were not detected and corrected.      Ursula Alert, MD 05/15/2021, 8:28 AM

## 2021-05-14 NOTE — Patient Instructions (Addendum)
[facility]  [address]  [place], [place] [address] Phone: [phone]   Lamotrigine Tablets What is this medication? LAMOTRIGINE (la MOE Patrecia Pace) prevents and controls seizures in people with epilepsy. It may also be used to treat bipolar disorder. It works by calming overactive nerves in your body. This medicine may be used for other purposes; ask your health care provider or pharmacist if you have questions. COMMON BRAND NAME(S): Lamictal, Subvenite What should I tell my care team before I take this medication? They need to know if you have any of these conditions: Heart disease History of irregular heartbeat Immune system problems Kidney disease Liver disease Low levels of folic acid in the blood Lupus Mental illness Suicidal thoughts, plans, or attempt; a previous suicide attempt by you or a family member An unusual or allergic reaction to lamotrigine or other seizure medications, other medications, foods, dyes, or preservatives Pregnant or trying to get pregnant Breast-feeding How should I use this medication? Take this medication by mouth with a glass of water. Follow the directions on the prescription label. Do not chew these tablets. If this medication upsets your stomach, take it with food or milk. Take your doses at regular intervals. Do not take your medication more often than directed. A special MedGuide will be given to you by the pharmacist with each new prescription and refill. Be sure to read this information carefully each time. Talk to your care team about the use of this medication in children. While this medication may be prescribed for children as young as 2 years for selected conditions, precautions do apply. Overdosage: If you think you have taken too much of this medicine contact a poison control center or emergency room at once. NOTE: This medicine is only for you. Do not share this medicine with others. What if I miss a  dose? If you miss a dose, take it as soon as you can. If it is almost time for your next dose, take only that dose. Do not take double or extra doses. What may interact with this medication? Atazanavir Birth control pills Certain medications for irregular heartbeat Certain medications for seizures like carbamazepine, phenobarbital, phenytoin, primidone, valproic acid Lopinavir Rifampin Ritonavir This list may not describe all possible interactions. Give your health care provider a list of all the medicines, herbs, non-prescription drugs, or dietary supplements you use. Also tell them if you smoke, drink alcohol, or use illegal drugs. Some items may interact with your medicine. What should I watch for while using this medication? Visit your care team for regular checks on your progress. If you take this medication for seizures, wear a Medic Alert bracelet or necklace. Carry an identification card with information about your condition, medications, and care team. It is important to take this medication exactly as directed. When first starting treatment, your dose will need to be adjusted slowly. It may take weeks or months before your dose is stable. You should contact your care team if your seizures get worse or if you have any new types of seizures. Do not stop taking this medication unless instructed by your care team. Stopping your medication suddenly can increase your seizures or their severity. This medication may cause serious skin reactions. They can happen weeks to months after starting the medication. Contact your care team right away if you notice fevers or flu-like symptoms with a rash. The rash may be red or purple and then turn into blisters or peeling of  the skin. Or, you might notice a red rash with swelling of the face, lips or lymph nodes in your neck or under your arms. You may get drowsy, dizzy, or have blurred vision. Do not drive, use machinery, or do anything that needs mental  alertness until you know how this medication affects you. To reduce dizzy or fainting spells, do not sit or stand up quickly, especially if you are an older patient. Alcohol can increase drowsiness and dizziness. Avoid alcoholic drinks. If you are taking this medication for bipolar disorder, it is important to report any changes in your mood to your care team. If your condition gets worse, you get mentally depressed, feel very hyperactive or manic, have difficulty sleeping, or have thoughts of hurting yourself or committing suicide, you need to get help from your care team right away. If you are a caregiver for someone taking this medication for bipolar disorder, you should also report these behavioral changes right away. The use of this medication may increase the chance of suicidal thoughts or actions. Pay special attention to how you are responding while on this medication. Your mouth may get dry. Chewing sugarless gum or sucking hard candy, and drinking plenty of water may help. Contact your care team if the problem does not go away or is severe. Women who become pregnant while using this medication may enroll in the [place] American Antiepileptic Drug Pregnancy Registry by calling [phone]. This registry collects information about the safety of antiepileptic medication use during pregnancy. This medication may cause a decrease in folic acid. You should make sure that you get enough folic acid while you are taking this medication. Discuss the foods you eat and the vitamins you take with your care team. What side effects may I notice from receiving this medication? Side effects that you should report to your care team as soon as possible: Allergic reactions--skin rash, itching, hives, swelling of the face, lips, tongue, or throat Change in vision Fever, neck pain or stiffness, sensitivity to light, headache, nausea, vomiting, confusion Heart rhythm changes--fast or irregular heartbeat, dizziness,  feeling faint or lightheaded, chest pain, trouble breathing Infection--fever, chills, cough, or sore throat Liver injury--right upper belly pain, loss of appetite, nausea, light-colored stool, dark yellow or brown urine, yellowing skin or eyes, unusual weakness or fatigue Low red blood cell count--unusual weakness or fatigue, dizziness, headache, trouble breathing Rash, fever, and swollen lymph nodes Redness, blistering, peeling or loosening of the skin, including inside the mouth Thoughts of suicide or self-harm, worsening mood, or feelings of depression Unusual bruising or bleeding Side effects that usually do not require medical attention (report to your care team if they continue or are bothersome): Diarrhea Dizziness Drowsiness Headache Nausea Stomach pain Tremors or shaking This list may not describe all possible side effects. Call your doctor for medical advice about side effects. You may report side effects to FDA at 1-800-FDA-1088. Where should I keep my medication? Keep out of the reach of children and pets. Store at [employer] C (77 degrees F). Protect from light. Get rid of any unused medication after the expiration date. To get rid of medications that are no longer needed or have expired: Take the medication to a medication take-back program. Check with your pharmacy or law enforcement to find a location. If you cannot return the medication, check the label or package insert to see if the medication should be thrown out in the garbage or flushed down the toilet. If you are not sure,  ask your care team. If it is safe to put it in the trash, empty the medication out of the container. Mix the medication with cat litter, dirt, coffee grounds, or other unwanted substance. Seal the mixture in a bag or container. Put it in the trash. NOTE: This sheet is a summary. It may not cover all possible information. If you have questions about this medicine, talk to your doctor, pharmacist, or  health care provider.  2022 Elsevier/Gold Standard (2020-11-20 00:00:00)

## 2021-05-15 DIAGNOSIS — Z4789 Encounter for other orthopedic aftercare: Secondary | ICD-10-CM | POA: Diagnosis not present

## 2021-05-15 DIAGNOSIS — M25562 Pain in left knee: Secondary | ICD-10-CM | POA: Diagnosis not present

## 2021-05-15 DIAGNOSIS — M25662 Stiffness of left knee, not elsewhere classified: Secondary | ICD-10-CM | POA: Diagnosis not present

## 2021-05-15 DIAGNOSIS — R262 Difficulty in walking, not elsewhere classified: Secondary | ICD-10-CM | POA: Diagnosis not present

## 2021-05-20 DIAGNOSIS — G894 Chronic pain syndrome: Secondary | ICD-10-CM | POA: Diagnosis not present

## 2021-05-22 ENCOUNTER — Encounter: Payer: Self-pay | Admitting: Oncology

## 2021-05-23 ENCOUNTER — Ambulatory Visit (INDEPENDENT_AMBULATORY_CARE_PROVIDER_SITE_OTHER): Payer: Medicare Other | Admitting: Licensed Clinical Social Worker

## 2021-05-23 ENCOUNTER — Encounter: Payer: Self-pay | Admitting: Oncology

## 2021-05-23 ENCOUNTER — Other Ambulatory Visit: Payer: Self-pay

## 2021-05-23 DIAGNOSIS — F4310 Post-traumatic stress disorder, unspecified: Secondary | ICD-10-CM

## 2021-05-23 NOTE — Progress Notes (Signed)
Virtual Visit via Video Note ? ?I connected with Cheyenne Gray on 05/25/21 at 11:00 AM EST by a video enabled telemedicine application and verified that I am speaking with the correct person using two identifiers. ? ?Location: ?Patient: home ?Provider: remote office [place], [place]) ?  ?I discussed the limitations of evaluation and management by telemedicine and the availability of in person appointments. The patient expressed understanding and agreed to proceed. ?  ?I discussed the assessment and treatment plan with the patient. The patient was provided an opportunity to ask questions and all were answered. The patient agreed with the plan and demonstrated an understanding of the instructions. ?  ?The patient was advised to call back or seek an in-person evaluation if the symptoms worsen or if the condition fails to improve as anticipated. ? ?I provided 42 minutes of non-face-to-face time during this encounter. ? ? ?Spencer Cardinal R Germain Koopmann, LCSW ? ?THERAPIST PROGRESS NOTE ? ?Session Time: [id number] ? ?Participation Level: Active ? ?Behavioral Response: NeatAlertDepressed ? ?Type of Therapy: Individual Therapy ? ?Treatment Goals addressed: Developed tx plan with pt ? ?ProgressTowards Goals: Initial ? ?Interventions: CBT ? ?Summary: Cheyenne Gray is a 56 y.o. female who presents with continuing symptoms related to trauma. Pt reports that overall mood has been up and down and that pt is experiencing stress/anxiety at times. ? ?Allowed pt to explore and express thoughts and feelings associated with recent life situations and external stressors. ? ?Continued recommendations are as follows: self care behaviors, positive social engagements, focusing on overall work/home/life balance, and focusing on positive physical and emotional wellness.  ?  ? ?Suicidal/Homicidal: No ? ?Therapist Response: Developed goals for pt and continuing therapy ? ?Plan: Return again in 4 weeks. ? ?Diagnosis: PTSD (post-traumatic stress  disorder) ? ?Collaboration of Care: Other Pt to continue with psychiatrist of record, Dr. Shea Evans ? ?Patient/Guardian was advised Release of Information must be obtained prior to any record release in order to collaborate their care with an outside provider. Patient/Guardian was advised if they have not already done so to contact the registration department to sign all necessary forms in order for [place] to release information regarding their care.  ? ?Consent: Patient/Guardian gives verbal consent for treatment and assignment of benefits for services provided during this visit. Patient/Guardian expressed understanding and agreed to proceed.  ? ?Adabella Stanis R Mael Delap, LCSW ?05/25/2021 ? ?

## 2021-05-23 NOTE — Plan of Care (Signed)
Developed treatment plan using pt input 
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
## 2021-05-27 DIAGNOSIS — G894 Chronic pain syndrome: Secondary | ICD-10-CM | POA: Diagnosis not present

## 2021-05-27 DIAGNOSIS — G8921 Chronic pain due to trauma: Secondary | ICD-10-CM | POA: Diagnosis not present

## 2021-05-27 DIAGNOSIS — M5440 Lumbago with sciatica, unspecified side: Secondary | ICD-10-CM | POA: Diagnosis not present

## 2021-05-27 DIAGNOSIS — I10 Essential (primary) hypertension: Secondary | ICD-10-CM | POA: Diagnosis not present

## 2021-05-27 DIAGNOSIS — M542 Cervicalgia: Secondary | ICD-10-CM | POA: Diagnosis not present

## 2021-05-27 DIAGNOSIS — M791 Myalgia, unspecified site: Secondary | ICD-10-CM | POA: Diagnosis not present

## 2021-05-27 DIAGNOSIS — Z79891 Long term (current) use of opiate analgesic: Secondary | ICD-10-CM | POA: Diagnosis not present

## 2021-05-27 DIAGNOSIS — G8929 Other chronic pain: Secondary | ICD-10-CM | POA: Diagnosis not present

## 2021-05-29 ENCOUNTER — Ambulatory Visit (INDEPENDENT_AMBULATORY_CARE_PROVIDER_SITE_OTHER): Payer: Medicare Other

## 2021-05-29 ENCOUNTER — Encounter: Payer: Self-pay | Admitting: Cardiology

## 2021-05-29 ENCOUNTER — Ambulatory Visit (INDEPENDENT_AMBULATORY_CARE_PROVIDER_SITE_OTHER): Payer: Medicare Other | Admitting: Cardiology

## 2021-05-29 ENCOUNTER — Other Ambulatory Visit: Payer: Self-pay

## 2021-05-29 VITALS — BP 120/80 | HR 58 | Ht 64.0 in | Wt 264.4 lb

## 2021-05-29 DIAGNOSIS — R002 Palpitations: Secondary | ICD-10-CM

## 2021-05-29 DIAGNOSIS — I422 Other hypertrophic cardiomyopathy: Secondary | ICD-10-CM | POA: Diagnosis not present

## 2021-05-29 DIAGNOSIS — E669 Obesity, unspecified: Secondary | ICD-10-CM | POA: Diagnosis not present

## 2021-05-29 NOTE — Patient Instructions (Addendum)
Medication Instructions:  ?- Your physician recommends that you continue on your current medications as directed. Please refer to the Current Medication list given to you today. ? ? ?*If you need a refill on your cardiac medications before your next appointment, please call your pharmacy* ? ? ?Lab Work: ?- none ordered ? ?If you have labs (blood work) drawn today and your tests are completely normal, you will receive your results only by: ?MyChart Message (if you have MyChart) OR ?A paper copy in the mail ?If you have any lab test that is abnormal or we need to change your treatment, we will call you to review the results. ? ? ?Testing/Procedures: ? ?1) Echocardiogram: ?- Your physician has requested that you have an echocardiogram. Echocardiography is a painless test that uses sound waves to create images of your heart. It provides your doctor with information about the size and shape of your heart and how well your heart?s chambers and valves are working. This procedure takes approximately one hour. There are no restrictions for this procedure. There is a possibility that an IV may need to be started during your test to inject an image enhancing agent. This is done to obtain more optimal pictures of your heart. Therefore we ask that you do at least drink some water prior to coming in to hydrate your veins.  ? ? ? ?2) 2 week heart monitor:  ? ?Length of wear: 14 days ?Placement: This will be mailed to your home address within 3-4 business days ? ?- Your physician has recommended that you wear a Zio (heart) monitor.  ? ?This monitor is a medical device that records the heart?s electrical activity. Doctors most often use these monitors to diagnose arrhythmias. Arrhythmias are problems with the speed or rhythm of the heartbeat. The monitor is a small device applied to your chest. You can wear one while you do your normal daily activities. While wearing this monitor if you have any symptoms to push the button and  record what you felt. Once you have worn this monitor for the period of time provider prescribed (Usually 14 days), you will return the monitor device in the postage paid box. Once it is returned they will download the data collected and provide [place] with a report which the provider will then review and we will call you with those results. Important tips: ? ?Avoid showering during the first 24 hours of wearing the monitor. ?Avoid excessive sweating to help maximize wear time. ?Do not submerge the device, no hot tubs, and no swimming pools. ?Keep any lotions or oils away from the patch. ?After 24 hours you may shower with the patch on. Take brief showers with your back facing the shower head.  ?Do not remove patch once it has been placed because that will interrupt data and decrease adhesive wear time. ?Push the button when you have any symptoms and write down what you were feeling. ?Once you have completed wearing your monitor, remove and place into box which has postage paid and place in your outgoing mailbox.  ?If for some reason you have misplaced your box then call our office and we can provide another box and/or mail it off for you. ? ? ?3) You have been referred to : Our Pharmacy team to discuss weight loss options ? ? ? ?Follow-Up: ?At [facility], you and your health needs are our priority.  As part of our continuing mission to provide you with exceptional heart care, we have created designated  Provider Care Teams.  These Care Teams include your primary Cardiologist (physician) and Advanced Practice Providers (APPs -  Physician Assistants and Nurse Practitioners) who all work together to provide you with the care you need, when you need it. ? ?We recommend signing up for the patient portal called "MyChart".  Sign up information is provided on this After Visit Summary.  MyChart is used to connect with patients for Virtual Visits (Telemedicine).  Patients are able to view lab/test results, encounter notes,  upcoming appointments, etc.  Non-urgent messages can be sent to your provider as well.   ?To learn more about what you can do with MyChart, go to [email or web address].   ? ?Your next appointment:   ?1 year(s) ? ?The format for your next appointment:   ?In Person ? ?Provider:   ?Steffanie Dunn, MD  ? ? ?Other Instructions ? ?Echocardiogram ?An echocardiogram is a test that uses sound waves (ultrasound) to produce images of the heart. ?Images from an echocardiogram can provide important information about: ?Heart size and shape. ?The size and thickness and movement of your heart's walls. ?Heart muscle function and strength. ?Heart valve function or if you have stenosis. Stenosis is when the heart valves are too narrow. ?If blood is flowing backward through the heart valves (regurgitation). ?A tumor or infectious growth around the heart valves. ?Areas of heart muscle that are not working well because of poor blood flow or injury from a heart attack. ?Aneurysm detection. An aneurysm is a weak or damaged part of an artery wall. The wall bulges out from the normal force of blood pumping through the body. ?Tell a health care provider about: ?Any allergies you have. ?All medicines you are taking, including vitamins, herbs, eye drops, creams, and over-the-counter medicines. ?Any blood disorders you have. ?Any surgeries you have had. ?Any medical conditions you have. ?Whether you are pregnant or may be pregnant. ?What are the risks? ?Generally, this is a safe test. However, problems may occur, including an allergic reaction to dye (contrast) that may be used during the test. ?What happens before the test? ?No specific preparation is needed. You may eat and drink normally. ?What happens during the test? ? ?You will take off your clothes from the waist up and put on a hospital gown. ?Electrodes or electrocardiogram (ECG)patches may be placed on your chest. The electrodes or patches are then connected to a device that  monitors your heart rate and rhythm. ?You will lie down on a table for an ultrasound exam. A gel will be applied to your chest to help sound waves pass through your skin. ?A handheld device, called a transducer, will be pressed against your chest and moved over your heart. The transducer produces sound waves that travel to your heart and bounce back (or "echo" back) to the transducer. These sound waves will be captured in real-time and changed into images of your heart that can be viewed on a video monitor. The images will be recorded on a computer and reviewed by your health care provider. ?You may be asked to change positions or hold your breath for a short time. This makes it easier to get different views or better views of your heart. ?In some cases, you may receive contrast through an IV in one of your veins. This can improve the quality of the pictures from your heart. ?The procedure may vary among health care providers and hospitals. ?What can I expect after the test? ?You may return to your normal, everyday life, including  diet, activities, and medicines, unless your health care provider tells you not to do that. ?Follow these instructions at home: ?It is up to you to get the results of your test. Ask your health care provider, or the department that is doing the test, when your results will be ready. ?Keep all follow-up visits. This is important. ?Summary ?An echocardiogram is a test that uses sound waves (ultrasound) to produce images of the heart. ?Images from an echocardiogram can provide important information about the size and shape of your heart, heart muscle function, heart valve function, and other possible heart problems. ?You do not need to do anything to prepare before this test. You may eat and drink normally. ?After the echocardiogram is completed, you may return to your normal, everyday life, unless your health care provider tells you not to do that. ?This information is not intended to  replace advice given to you by your health care provider. Make sure you discuss any questions you have with your health care provider. ?Document Revised: 11/14/2020 Document Reviewed: 10/25/2019 ?[employer]
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
## 2021-06-01 DIAGNOSIS — R002 Palpitations: Secondary | ICD-10-CM

## 2021-06-04 ENCOUNTER — Telehealth (INDEPENDENT_AMBULATORY_CARE_PROVIDER_SITE_OTHER): Payer: Medicare Other | Admitting: Psychiatry

## 2021-06-04 ENCOUNTER — Other Ambulatory Visit: Payer: Self-pay

## 2021-06-04 ENCOUNTER — Encounter: Payer: Self-pay | Admitting: Psychiatry

## 2021-06-04 ENCOUNTER — Other Ambulatory Visit: Payer: Self-pay | Admitting: Cardiovascular Disease

## 2021-06-04 ENCOUNTER — Telehealth: Payer: Self-pay | Admitting: Cardiology

## 2021-06-04 DIAGNOSIS — R55 Syncope and collapse: Secondary | ICD-10-CM

## 2021-06-04 DIAGNOSIS — I2510 Atherosclerotic heart disease of native coronary artery without angina pectoris: Secondary | ICD-10-CM

## 2021-06-04 DIAGNOSIS — F331 Major depressive disorder, recurrent, moderate: Secondary | ICD-10-CM

## 2021-06-04 DIAGNOSIS — R262 Difficulty in walking, not elsewhere classified: Secondary | ICD-10-CM | POA: Diagnosis not present

## 2021-06-04 DIAGNOSIS — F411 Generalized anxiety disorder: Secondary | ICD-10-CM

## 2021-06-04 DIAGNOSIS — Z4789 Encounter for other orthopedic aftercare: Secondary | ICD-10-CM | POA: Diagnosis not present

## 2021-06-04 DIAGNOSIS — G4701 Insomnia due to medical condition: Secondary | ICD-10-CM

## 2021-06-04 DIAGNOSIS — M25662 Stiffness of left knee, not elsewhere classified: Secondary | ICD-10-CM | POA: Diagnosis not present

## 2021-06-04 DIAGNOSIS — M25562 Pain in left knee: Secondary | ICD-10-CM | POA: Diagnosis not present

## 2021-06-04 DIAGNOSIS — F1590 Other stimulant use, unspecified, uncomplicated: Secondary | ICD-10-CM

## 2021-06-04 DIAGNOSIS — F4310 Post-traumatic stress disorder, unspecified: Secondary | ICD-10-CM

## 2021-06-04 MED ORDER — MIRTAZAPINE 15 MG PO TABS: 22.5000 mg | ORAL_TABLET | Freq: Every day | ORAL | 0 refills | Status: DC

## 2021-06-04 NOTE — Telephone Encounter (Signed)
LVM to schedule
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
## 2021-06-04 NOTE — Telephone Encounter (Signed)
Please schedule overdue F/U appointment with Dr. Gollan for refills. Thank you! 

## 2021-06-04 NOTE — Telephone Encounter (Signed)
Patient states she had an episode last week where she felt wobbly, he knees started feeling weak, and she sat down in a chair, and then another episode occurred when she got home, she remembers taking the dog out and then doesn't remember anything else until she awoke in 2:30 a.m. States she isnt sure if she passed out or not  Pt c/o Syncope: STAT if syncope occurred within 30 minutes and pt complains of lightheadedness High Priority if episode of passing out, completely, today or in last 24 hours   Did you pass out today? no   When is the last time you passed out? She doesn't think she passed out, but last week she had an episode   Has this occurred multiple times? At least twice,   Did you have any symptoms prior to passing out? Eyes got cloudy, couldn't see straight, started sweating, legs started "jerking", not sure about arms. Doesn't remember much after that. She was in the grocery store and her friend got her to a motorized cart.

## 2021-06-04 NOTE — Telephone Encounter (Signed)
Hx of Vasovagal syncope. Recent OV with Echo and Zio ordered, none are completed yet. Patient got the monitor but had to send it back for a new one so she was not wearing the monitor during the event. ? ?Patient had pre syncope on 3/16 or 17 with wobbling, and weak knees. Other symptoms that she has prior to is her eyes get cloudy, starts sweating, then her legs buckle/jerk. One of her girl friends was with her and helped her sit down. Got home about ~10 minute later. Took the dog out and her friend came back to check on her, saying she could tell that she still didn't feel good. The patient went back inside with the dog and told her friend she was okay and she left. The patient then sat on the recliner to rest. She is not sure if she fell asleep or passed out but next thing she knows it was 2:30 am. She felt okay at 2:30 am just very "wore out" and went and got in bed. Patient does not check vital signs at home. Patient has not had any episodes since and is feeling back to normal. No missed doses of medication.  ?ED precautions given.  ? ?Will forward to MD for advisement.  ?

## 2021-06-06 NOTE — Telephone Encounter (Signed)
ZIO calling to report patient monitor did fall off and was unable to put back on ?They have sent a replacement in the mail ?

## 2021-06-07 DIAGNOSIS — Z4789 Encounter for other orthopedic aftercare: Secondary | ICD-10-CM | POA: Diagnosis not present

## 2021-06-07 DIAGNOSIS — M25662 Stiffness of left knee, not elsewhere classified: Secondary | ICD-10-CM | POA: Diagnosis not present

## 2021-06-07 DIAGNOSIS — R262 Difficulty in walking, not elsewhere classified: Secondary | ICD-10-CM | POA: Diagnosis not present

## 2021-06-07 DIAGNOSIS — M25562 Pain in left knee: Secondary | ICD-10-CM | POA: Diagnosis not present

## 2021-06-14 DIAGNOSIS — Z4789 Encounter for other orthopedic aftercare: Secondary | ICD-10-CM | POA: Diagnosis not present

## 2021-06-14 DIAGNOSIS — R262 Difficulty in walking, not elsewhere classified: Secondary | ICD-10-CM | POA: Diagnosis not present

## 2021-06-14 DIAGNOSIS — M25662 Stiffness of left knee, not elsewhere classified: Secondary | ICD-10-CM | POA: Diagnosis not present

## 2021-06-14 DIAGNOSIS — M25562 Pain in left knee: Secondary | ICD-10-CM | POA: Diagnosis not present

## 2021-06-17 ENCOUNTER — Other Ambulatory Visit: Payer: Medicare Other

## 2021-06-24 ENCOUNTER — Other Ambulatory Visit: Payer: Self-pay | Admitting: Family

## 2021-06-24 ENCOUNTER — Other Ambulatory Visit: Payer: Self-pay | Admitting: Cardiovascular Disease

## 2021-06-24 ENCOUNTER — Other Ambulatory Visit: Payer: Self-pay | Admitting: Internal Medicine

## 2021-06-24 DIAGNOSIS — E559 Vitamin D deficiency, unspecified: Secondary | ICD-10-CM

## 2021-06-24 DIAGNOSIS — M25562 Pain in left knee: Secondary | ICD-10-CM | POA: Diagnosis not present

## 2021-06-24 DIAGNOSIS — I2510 Atherosclerotic heart disease of native coronary artery without angina pectoris: Secondary | ICD-10-CM

## 2021-06-24 DIAGNOSIS — Z4789 Encounter for other orthopedic aftercare: Secondary | ICD-10-CM | POA: Diagnosis not present

## 2021-06-24 DIAGNOSIS — R262 Difficulty in walking, not elsewhere classified: Secondary | ICD-10-CM | POA: Diagnosis not present

## 2021-06-24 DIAGNOSIS — M25662 Stiffness of left knee, not elsewhere classified: Secondary | ICD-10-CM | POA: Diagnosis not present

## 2021-06-24 DIAGNOSIS — R55 Syncope and collapse: Secondary | ICD-10-CM

## 2021-06-26 DIAGNOSIS — Z4789 Encounter for other orthopedic aftercare: Secondary | ICD-10-CM | POA: Diagnosis not present

## 2021-06-26 DIAGNOSIS — M25562 Pain in left knee: Secondary | ICD-10-CM | POA: Diagnosis not present

## 2021-06-26 DIAGNOSIS — M25662 Stiffness of left knee, not elsewhere classified: Secondary | ICD-10-CM | POA: Diagnosis not present

## 2021-06-26 DIAGNOSIS — R262 Difficulty in walking, not elsewhere classified: Secondary | ICD-10-CM | POA: Diagnosis not present

## 2021-07-01 DIAGNOSIS — I10 Essential (primary) hypertension: Secondary | ICD-10-CM | POA: Diagnosis not present

## 2021-07-01 DIAGNOSIS — G8921 Chronic pain due to trauma: Secondary | ICD-10-CM | POA: Diagnosis not present

## 2021-07-01 DIAGNOSIS — G8929 Other chronic pain: Secondary | ICD-10-CM | POA: Diagnosis not present

## 2021-07-01 DIAGNOSIS — M542 Cervicalgia: Secondary | ICD-10-CM | POA: Diagnosis not present

## 2021-07-01 DIAGNOSIS — M791 Myalgia, unspecified site: Secondary | ICD-10-CM | POA: Diagnosis not present

## 2021-07-01 DIAGNOSIS — M5440 Lumbago with sciatica, unspecified side: Secondary | ICD-10-CM | POA: Diagnosis not present

## 2021-07-01 DIAGNOSIS — G894 Chronic pain syndrome: Secondary | ICD-10-CM | POA: Diagnosis not present

## 2021-07-01 DIAGNOSIS — Z79891 Long term (current) use of opiate analgesic: Secondary | ICD-10-CM | POA: Diagnosis not present

## 2021-07-03 DIAGNOSIS — M25562 Pain in left knee: Secondary | ICD-10-CM | POA: Diagnosis not present

## 2021-07-03 DIAGNOSIS — Z4789 Encounter for other orthopedic aftercare: Secondary | ICD-10-CM | POA: Diagnosis not present

## 2021-07-03 DIAGNOSIS — R262 Difficulty in walking, not elsewhere classified: Secondary | ICD-10-CM | POA: Diagnosis not present

## 2021-07-03 DIAGNOSIS — M25662 Stiffness of left knee, not elsewhere classified: Secondary | ICD-10-CM | POA: Diagnosis not present

## 2021-07-03 DIAGNOSIS — R002 Palpitations: Secondary | ICD-10-CM | POA: Diagnosis not present

## 2021-07-05 ENCOUNTER — Other Ambulatory Visit: Payer: Medicare Other

## 2021-07-08 ENCOUNTER — Other Ambulatory Visit: Payer: Medicare Other

## 2021-07-08 ENCOUNTER — Other Ambulatory Visit: Payer: Self-pay | Admitting: Cardiovascular Disease

## 2021-07-08 DIAGNOSIS — I2510 Atherosclerotic heart disease of native coronary artery without angina pectoris: Secondary | ICD-10-CM

## 2021-07-08 DIAGNOSIS — R55 Syncope and collapse: Secondary | ICD-10-CM

## 2021-07-08 NOTE — Telephone Encounter (Signed)
Please advise pt hasn't been seen since 01/2020. ?Pt needing refill Crestor 10 mg . ?Pt has future appointment scheduled. ?

## 2021-07-09 ENCOUNTER — Ambulatory Visit: Payer: Medicare Other | Admitting: Cardiovascular Disease

## 2021-07-09 ENCOUNTER — Other Ambulatory Visit: Payer: Self-pay | Admitting: Cardiovascular Disease

## 2021-07-10 ENCOUNTER — Ambulatory Visit (INDEPENDENT_AMBULATORY_CARE_PROVIDER_SITE_OTHER): Payer: Medicare Other | Admitting: Licensed Clinical Social Worker

## 2021-07-10 DIAGNOSIS — Z4789 Encounter for other orthopedic aftercare: Secondary | ICD-10-CM | POA: Diagnosis not present

## 2021-07-10 DIAGNOSIS — R262 Difficulty in walking, not elsewhere classified: Secondary | ICD-10-CM | POA: Diagnosis not present

## 2021-07-10 DIAGNOSIS — M25662 Stiffness of left knee, not elsewhere classified: Secondary | ICD-10-CM | POA: Diagnosis not present

## 2021-07-10 DIAGNOSIS — F4310 Post-traumatic stress disorder, unspecified: Secondary | ICD-10-CM

## 2021-07-10 DIAGNOSIS — M25562 Pain in left knee: Secondary | ICD-10-CM | POA: Diagnosis not present

## 2021-07-10 NOTE — Plan of Care (Signed)
?  Problem: Reduce the negative impact trauma related symptoms have on social, occupational, and family functioning per pt self report 3 out of 5 sessions documented. PTSD-Trauma Disorder CCP Problem  1  ?Goal: LTG: Reduce frequency, intensity, and duration of PTSD symptoms so daily functioning is improved: Input needed on appropriate metric.  per pt self report ?Outcome: Progressing ?Goal: LTG: Elimination of maladaptive behaviors and thinking patterns which interfere with resolution of trauma: per pt self report ?Outcome: Progressing ?  ?

## 2021-07-10 NOTE — Progress Notes (Signed)
Virtual Visit via Video Note ? ?I connected with IVET GUERRIERI on 07/10/21 at 11:00 AM EDT by a video enabled telemedicine application and verified that I am speaking with the correct person using two identifiers. ? ?Location: ?Patient: home ?Provider: [facility] ?  ?I discussed the limitations of evaluation and management by telemedicine and the availability of in person appointments. The patient expressed understanding and agreed to proceed. ?  ?I discussed the assessment and treatment plan with the patient. The patient was provided an opportunity to ask questions and all were answered. The patient agreed with the plan and demonstrated an understanding of the instructions. ?  ?The patient was advised to call back or seek an in-person evaluation if the symptoms worsen or if the condition fails to improve as anticipated. ? ?I provided 40 minutes of non-face-to-face time during this encounter. ? ? ?Tyrell Seifer R Mariellen Blaney, LCSW ? ?THERAPIST PROGRESS NOTE ? ?Session Time: 11-1140a ? ?Participation Level: Active ? ?Behavioral Response: NeatAlertDepressed ? ?Type of Therapy: Individual Therapy ? ?Treatment Goals addressed: Problem: Reduce the negative impact trauma related symptoms have on social, occupational, and family functioning per pt self report 3 out of 5 sessions documented. PTSD-Trauma Disorder CCP Problem  1  ?Goal: LTG: Reduce frequency, intensity, and duration of PTSD symptoms so daily functioning is improved: Input needed on appropriate metric.  per pt self report ?Outcome: Progressing ?Goal: LTG: Elimination of maladaptive behaviors and thinking patterns which interfere with resolution of trauma: per pt self report ?Outcome: Progressing ? ?ProgressTowards Goals: Progressing ? ?Interventions: CBT and Other: trauma focused ? ?Summary: ROSSETTA KAMA is a 56 y.o. female who presents with continuing symptoms related to trauma. Pt reports that overall mood has been up and down and that pt is experiencing  stress/anxiety at times. Pt reports that she is compliant with mirtazapine--couldn't tolerate lamictal.  ? ?Allowed pt to explore and express thoughts and feelings associated with recent life situations and external stressors.Patient reports that she is still experiencing some pain associated with her leg. Patient is following up with recommendations to do physical activity. Patient reports that she is using a cane to assist her balance and walking. Patient reports that she is trying to play more games on her telephone, reading the Bible, and engaging in more activities on days when her pain levels are lower. Patient reports that she is socially engaging with her neighbors. Patient reports that sleep quality and quantity is continuing to be inconsistent. Patient does report that she takes some small naps throughout the day, which she is aware can be contributing factors to insomnia. Explored patients relationship with daughter, and several areas where patient cannot understand current estrangement. patient reports that she feels all of this happened when her daughter started dating her partner. Examined pts role in the estrangement and allowed pt to identify that its more her daughter's decision versus patients decision. Encouraged pt to allow the space and continue reaching out on birthdays, holidays, etc. Reviewed current coping skills for managing stress and anxiety. ? ?Continued recommendations are as follows: self care behaviors, positive social engagements, focusing on overall work/home/life balance, and focusing on positive physical and emotional wellness.  ?  ?Suicidal/Homicidal: No ? ?Therapist Response: Developed goals for pt and continuing therapy ? ?Plan: Return again in 4 weeks. ? ?Diagnosis: PTSD (post-traumatic stress disorder) ? ?Collaboration of Care: Other Pt to continue with psychiatrist of record, Dr. Elna Breslow ? ?Patient/Guardian was advised Release of Information must be obtained prior to any  record release  in order to collaborate their care with an outside provider. Patient/Guardian was advised if they have not already done so to contact the registration department to sign all necessary forms in order for [place] to release information regarding their care.  ? ?Consent: Patient/Guardian gives verbal consent for treatment and assignment of benefits for services provided during this visit. Patient/Guardian expressed understanding and agreed to proceed.  ? ?Chantia Amalfitano R Kareemah Grounds, LCSW ?07/10/2021 ? ?

## 2021-07-15 ENCOUNTER — Telehealth (INDEPENDENT_AMBULATORY_CARE_PROVIDER_SITE_OTHER): Payer: Medicare Other | Admitting: Psychiatry

## 2021-07-15 ENCOUNTER — Encounter: Payer: Self-pay | Admitting: Psychiatry

## 2021-07-15 ENCOUNTER — Ambulatory Visit: Payer: Medicare Other

## 2021-07-15 DIAGNOSIS — F4310 Post-traumatic stress disorder, unspecified: Secondary | ICD-10-CM

## 2021-07-15 DIAGNOSIS — F411 Generalized anxiety disorder: Secondary | ICD-10-CM

## 2021-07-15 DIAGNOSIS — F331 Major depressive disorder, recurrent, moderate: Secondary | ICD-10-CM | POA: Diagnosis not present

## 2021-07-15 DIAGNOSIS — F1590 Other stimulant use, unspecified, uncomplicated: Secondary | ICD-10-CM

## 2021-07-15 DIAGNOSIS — G4701 Insomnia due to medical condition: Secondary | ICD-10-CM | POA: Diagnosis not present

## 2021-07-15 MED ORDER — VENLAFAXINE HCL ER 37.5 MG PO CP24: 37.5000 mg | ORAL_CAPSULE | Freq: Every day | ORAL | 1 refills | Status: DC

## 2021-07-15 NOTE — Patient Instructions (Signed)
Venlafaxine Extended-Release Capsules What is this medication? VENLAFAXINE (VEN la fax een) treats depression and anxiety. It increases the amount of serotonin and norepinephrine in the brain, hormones that help regulate mood. It belongs to a group of medications called SNRIs. This medicine may be used for other purposes; ask your health care provider or pharmacist if you have questions. COMMON BRAND NAME(S): Effexor XR What should I tell my care team before I take this medication? They need to know if you have any of these conditions: Bleeding disorders Glaucoma Heart disease High blood pressure High cholesterol Kidney disease Liver disease Low levels of sodium in the blood Mania or bipolar disorder Seizures Suicidal thoughts, plans, or attempt; a previous suicide attempt by you or a family Take medications that treat or prevent blood clots Thyroid disease An unusual or allergic reaction to venlafaxine, desvenlafaxine, other medications, foods, dyes, or preservatives Pregnant or trying to get pregnant Breast-feeding How should I use this medication? Take this medication by mouth with a full glass of water. Follow the directions on the prescription label. Do not cut, crush, or chew this medication. Take it with food. If needed, the capsule may be carefully opened and the entire contents sprinkled on a spoonful of cool applesauce. Swallow the applesauce/pellet mixture right away without chewing and follow with a glass of water to ensure complete swallowing of the pellets. Try to take your medication at about the same time each day. Do not take your medication more often than directed. Do not stop taking this medication suddenly except upon the advice of your care team. Stopping this medication too quickly may cause serious side effects or your condition may worsen. A special MedGuide will be given to you by the pharmacist with each prescription and refill. Be sure to read this information  carefully each time. Talk to your care team regarding the use of this medication in children. Special care may be needed. Overdosage: If you think you have taken too much of this medicine contact a poison control center or emergency room at once. NOTE: This medicine is only for you. Do not share this medicine with others. What if I miss a dose? If you miss a dose, take it as soon as you can. If it is almost time for your next dose, take only that dose. Do not take double or extra doses. What may interact with this medication? Do not take this medication with any of the following: Certain medications for fungal infections like fluconazole, itraconazole, ketoconazole, posaconazole, voriconazole Cisapride Desvenlafaxine Dronedarone Duloxetine Levomilnacipran Linezolid MAOIs like Carbex, Eldepryl, Marplan, Nardil, and Parnate Methylene blue (injected into a vein) Milnacipran Pimozide Thioridazine This medication may also interact with the following: Amphetamines Aspirin and aspirin-like medications Certain medications for depression, anxiety, or psychotic disturbances Certain medications for migraine headaches like almotriptan, eletriptan, frovatriptan, naratriptan, rizatriptan, sumatriptan, zolmitriptan Certain medications for sleep Certain medications that treat or prevent blood clots like dalteparin, enoxaparin, warfarin Cimetidine Clozapine Diuretics Fentanyl Furazolidone Indinavir Isoniazid Lithium Metoprolol NSAIDS, medications for pain and inflammation, like ibuprofen or naproxen Other medications that prolong the QT interval (cause an abnormal heart rhythm) like dofetilide, ziprasidone Procarbazine Rasagiline Supplements like St. John's wort, kava kava, valerian Tramadol Tryptophan This list may not describe all possible interactions. Give your health care provider a list of all the medicines, herbs, non-prescription drugs, or dietary supplements you use. Also tell them  if you smoke, drink alcohol, or use illegal drugs. Some items may interact with your medicine. What should   I watch for while using this medication? Tell your care team if your symptoms do not get better or if they get worse. Visit your care team for regular checks on your progress. Because it may take several weeks to see the full effects of this medication, it is important to continue your treatment as prescribed by your care team. Watch for new or worsening thoughts of suicide or depression. This includes sudden changes in mood, behaviors, or thoughts. These changes can happen at any time but are more common in the beginning of treatment or after a change in dose. Call your care team right away if you experience these thoughts or worsening depression. Manic episodes may happen in patients with bipolar disorder who take this medication. Watch for changes in feelings or behaviors such as feeling anxious, nervous, agitated, panicky, irritable, hostile, aggressive, impulsive, severely restless, overly excited and hyperactive, or trouble sleeping. These changes can happen at any time but are more common in the beginning of treatment or after a change in dose. Call your care team right away if you notice any of these symptoms. This medication can cause an increase in blood pressure. Check with your care team for instructions on monitoring your blood pressure while taking this medication. You may get drowsy or dizzy. Do not drive, use machinery, or do anything that needs mental alertness until you know how this medication affects you. Do not stand or sit up quickly, especially if you are an older patient. This reduces the risk of dizzy or fainting spells. Do not drink alcohol while taking this medication. Drinking alcohol may alter the effects of your medication. Serious side effects may occur. Your mouth may get dry. Chewing sugarless gum, sucking hard candy and drinking plenty of water will help. Contact your  care team if the problem does not go away or is severe. What side effects may I notice from receiving this medication? Side effects that you should report to your care team as soon as possible: Allergic reactions--skin rash, itching, hives, swelling of the face, lips, tongue, or throat Bleeding--bloody or black, tar-like stools, red or dark brown urine, vomiting blood or brown material that looks like coffee grounds, small, red or purple spots on skin, unusual bleeding or bruising Heart rhythm changes--fast or irregular heartbeat, dizziness, feeling faint or lightheaded, chest pain, trouble breathing Increase in blood pressure Loss of appetite with weight loss Low sodium level--muscle weakness, fatigue, dizziness, headache, confusion Serotonin syndrome--irritability, confusion, fast or irregular heartbeat, muscle stiffness, twitching muscles, sweating, high fever, seizures, chills, vomiting, diarrhea Sudden eye pain or change in vision such as blurry vision, seeing halos around lights, vision loss Thoughts of suicide or self-harm, worsening mood, feelings of depression Side effects that usually do not require medical attention (report to your care team if they continue or are bothersome): Anxiety, nervousness Change in sex drive or performance Dizziness Dry mouth Excessive sweating Nausea Tremors or shaking Trouble sleeping This list may not describe all possible side effects. Call your doctor for medical advice about side effects. You may report side effects to FDA at 1-800-FDA-1088. Where should I keep my medication? Keep out of the reach of children and pets. Store at a controlled temperature between 20 and 25 degrees C (68 degrees and 77 degrees F), in a dry place. Throw away any unused medication after the expiration date. NOTE: This sheet is a summary. It may not cover all possible information. If you have questions about this medicine, talk to your doctor,   pharmacist, or health care  provider.  2023 Elsevier/Gold Standard (2020-10-01 00:00:00)  

## 2021-07-15 NOTE — Progress Notes (Signed)
Virtual Visit via Video Note ? ?I connected with Cheyenne Gray on 07/15/21 at  4:20 PM EDT by a video enabled telemedicine application and verified that I am speaking with the correct person using two identifiers. ? ?Location ?Provider Location : [facility] ?Patient Location : Home ? ?Participants: Patient , Provider ?  ?I discussed the limitations of evaluation and management by telemedicine and the availability of in person appointments. The patient expressed understanding and agreed to proceed. ?  ?I discussed the assessment and treatment plan with the patient. The patient was provided an opportunity to ask questions and all were answered. The patient agreed with the plan and demonstrated an understanding of the instructions. ?  ?The patient was advised to call back or seek an in-person evaluation if the symptoms worsen or if the condition fails to improve as anticipated. ? ? ?[facility] MD OP Progress Note ? ?07/15/2021 4:57 PM ?Cheyenne Gray  ?MRN:  [id number] ? ?Chief Complaint:  ?Chief Complaint  ?Patient presents with  ? Follow-up: 56 year old Caucasian female, widowed, has a history of PTSD, MDD, GAD status post recent LE surgery, presented for medication management.  ? ?HPI: Cheyenne Gray is a 56 year old Caucasian female, widowed, on disability, has a history of PTSD, MDD, GAD, insomnia, caffeine use disorder, history of prolonged QT syndrome, vitamin B12 deficiency, multiple other medical problems including recent surgery of lower left extremity, presented for medication management. ? ?Patient today reports she is currently recovering with regards to her lower extremities S/P surgery, is able to walk better. ? ?Patient however reports she continues to struggle with motivation and energy.  Patient reports she does not want to get out of her home because she has been used to staying to herself for so long.  She reports even when her neighbor calls her to go to the grocery store to pick up something she is not  motivated to do so. ? ?Patient reports she struggles with low energy, anhedonia, concentration problems. ? ?Reports she takes naps during the day since she has nothing else to do.  She reports sleep at night continues to be interrupted although she is able to fall back asleep. ? ?Currently compliant on mirtazapine.  Denies side effects. ? ?Continues to be compliant with therapy, reports therapy sessions are beneficial. ? ?Denies suicidality, homicidality or perceptual disturbances. ? ?Reports she continues to drink 2 cans of caffeinated drinks, currently working on cutting back. ? ? ? ?Visit Diagnosis:  ?  ICD-10-CM   ?1. PTSD (post-traumatic stress disorder)  F43.10 venlafaxine XR (EFFEXOR-XR) 37.5 MG 24 hr capsule  ?  ?2. GAD (generalized anxiety disorder)  F41.1 venlafaxine XR (EFFEXOR-XR) 37.5 MG 24 hr capsule  ?  ?3. MDD (major depressive disorder), recurrent episode, moderate (HCC)  F33.1 venlafaxine XR (EFFEXOR-XR) 37.5 MG 24 hr capsule  ?  ?4. Insomnia due to medical condition  G47.01   ? Mood, pain, caffeine use  ?  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  ? ? ?Past Psychiatric History: Reviewed past psychiatric history from progress note on 10/17/2017.  Past trials of Paxil, Cymbalta, Valium, melatonin, trazodone, Ambien, Wellbutrin, Abilify. ? ?Past Medical History:  ?Past Medical History:  ?Diagnosis Date  ? (HFpEF) heart failure with preserved ejection fraction ([place])   ? a. Echo 2014: EF 65-70%, nl WM, mildly dilated LA, PASP nl; b. 12/2014 Echo: EF 65-70%, no rwma, mod septal hypertrophy w/o LVOT gradient or SAM; c. 07/2017 Echo: EF 55-60%, no rwma, mildly dil  RV w/ nl syst fxn. Mildly dil RA. Dilated IVC w/ elevated CVP. Triv post effusion.  ? Anxiety   ? Asthma   ? Chronic pain   ? on methadone, managed by Dr. Primus Bravo  ? Concussion   ? hx of 4  ? Coronary artery disease, non-occlusive   ? a. LHC 1/18: proximal to mid LAD 40% stenosed, mid LAD 30% stenosed, mid RCA 20% stenosed, distal RCA 20% stenosed, EF  55-65%, LVEDP normal  ? Depression   ? DJD (degenerative joint disease), multiple sites   ? History of shingles   ? Hypertension   ? Iron deficiency anemia   ? Long QT interval   ? Obesity   ? Palpitations   ? a. 24 hour Holter: NSR, sinus brady down to 48, occasional PVCs & couplets, 8 beats NSVT; b. 30 day event monitor 2015: NSR with rare PVC.  ? Psoriasis   ? Syncope and collapse   ? Vitamin D deficiency   ? Wears dentures   ? full upper and lower  ?  ?Past Surgical History:  ?Procedure Laterality Date  ? ABDOMINOPLASTY    ? tummy tuck ? year   ? [place] SURGERY  2001  ? CARDIAC CATHETERIZATION Left 04/16/2016  ? Procedure: Left Heart Cath and Coronary Angiography;  Surgeon: [name], MD;  Location: [facility];  Service: Cardiovascular;  Laterality: Left;  ? CHOLECYSTECTOMY  2001  ? COLONOSCOPY WITH PROPOFOL N/A 09/28/2018  ? Procedure: COLONOSCOPY WITH PROPOFOL;  Surgeon: Virgel Manifold, MD;  Location: [facility];  Service: Endoscopy;  Laterality: N/A;  ? ESOPHAGOGASTRODUODENOSCOPY (EGD) WITH PROPOFOL N/A 09/28/2018  ? Procedure: ESOPHAGOGASTRODUODENOSCOPY (EGD) WITH BIOPSY;  Surgeon: Virgel Manifold, MD;  Location: [facility];  Service: Endoscopy;  Laterality: N/A;  ? EXTERNAL FIXATION LEG Left 10/29/2020  ? Procedure: EXTERNAL FIXATION LEFT KNEE;  Surgeon: Altamese Laurens, MD;  Location: [facility];  Service: Orthopedics;  Laterality: Left;  ? GALLBLADDER SURGERY    ? GASTRIC BYPASS  2001  ? GASTROPLASTY    ? ? ?Family Psychiatric History: Reviewed family psychiatric history from progress note on 10/17/2017. ? ?Family History:  ?Family History  ?Problem Relation Age of Onset  ? Heart attack Mother   ? Cancer Mother   ?     pancreatitic 75  ? Early death Mother   ? Heart attack Father 58  ?     MI  ? Early death Father   ? Heart disease Father   ? Heart attack Brother   ? Heart disease Brother   ? Arthritis Brother   ? Depression Brother   ? Diabetes Brother   ? Heart  attack Maternal Grandmother   ? Cancer Maternal Grandmother   ?     pancreatitic   ? Heart disease Maternal Grandmother   ? Lung cancer Maternal Grandmother   ? Pancreatic cancer Maternal Grandmother   ? Cancer Maternal Uncle   ?     pancreatitic   ? Cancer Paternal Grandmother   ?     ? type   ? Diabetes Paternal Grandmother   ? Cancer Maternal Uncle   ?     pancreatitic   ? Breast cancer Maternal Aunt   ? Cancer Maternal Aunt   ?     GYN  ? Cancer Maternal Aunt   ?     GYN  ? ? ?Social History: Reviewed social history from progress note on 10/17/2017. ?Social History  ? ?  Socioeconomic History  ? Marital status: Widowed  ?  Spouse name: Not on file  ? Number of children: 1  ? Years of education: assoc degree  ? Highest education level: Associate degree: occupational, Hotel manager, or vocational program  ?Occupational History  ? Not on file  ?Tobacco Use  ? Smoking status: Never  ? Smokeless tobacco: Never  ?Vaping Use  ? Vaping Use: Never used  ?Substance and Sexual Activity  ? Alcohol use: No  ?  Alcohol/week: 0.0 standard drinks  ?  Comment: holidays  ? Drug use: No  ? Sexual activity: Not Currently  ?Other Topics Concern  ? Not on file  ?Social History Narrative  ? Adopted daughter [name] [phone] (now lives in [place] going to [place] has apt there)  ? Significant other mac [phone], former husband died   ? 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 Never smoker   ? No guns  ? Wears seat belt   ? No caffeine  ? ?Social Determinants of Health  ? ?Financial Resource Strain: Not on file  ?Food Insecurity: Not on file  ?Transportation Needs: Not on file  ?Physical Activity: Not on file  ?Stress: Not on file  ?Social Connections: Not on file  ? ? ?Allergies:  ?Allergies  ?Allergen Reactions  ? Covid-19 (Mrna) Vaccine Therapist, music) [Covid-19 (Mrna) Vaccine] Itching, Palpitations and Other (See Comments)  ?  Throat closing.   ? Penicillins Hives, Shortness Of Breath and Rash  ?  Has patient had a PCN reaction causing immediate rash,  facial/tongue/throat swelling, SOB or lightheadedness with hypotension: Yes ?Has patient had a PCN reaction causing severe rash involving mucus membranes or skin necrosis: No ?Has patient had a PCN reaction that re

## 2021-07-17 ENCOUNTER — Encounter: Payer: Self-pay | Admitting: Oncology

## 2021-07-23 NOTE — Progress Notes (Deleted)
NO SHOW

## 2021-07-24 ENCOUNTER — Ambulatory Visit: Payer: Medicare Other | Admitting: Cardiovascular Disease

## 2021-07-24 ENCOUNTER — Other Ambulatory Visit: Payer: Medicare Other

## 2021-07-26 ENCOUNTER — Ambulatory Visit: Payer: Medicare Other

## 2021-07-26 ENCOUNTER — Telehealth: Payer: Self-pay | Admitting: Internal Medicine

## 2021-07-26 NOTE — Telephone Encounter (Signed)
Copied from CRM [id number]. Topic: Medicare AWV ?>> Jul 26, 2021  9:53 AM Harris-Coley, Avon Gully wrote: ?Reason for CRM: Left message for patient to schedule Annual Wellness Visit.  Please schedule with Nurse Health Advisor Denisa O'Brien-Blaney, LPN at [facility].  Please call [phone] ask for Olegario Messier ?

## 2021-07-29 ENCOUNTER — Other Ambulatory Visit: Payer: Self-pay | Admitting: Cardiovascular Disease

## 2021-07-29 DIAGNOSIS — G894 Chronic pain syndrome: Secondary | ICD-10-CM | POA: Diagnosis not present

## 2021-07-29 DIAGNOSIS — Z79891 Long term (current) use of opiate analgesic: Secondary | ICD-10-CM | POA: Diagnosis not present

## 2021-07-29 DIAGNOSIS — M5440 Lumbago with sciatica, unspecified side: Secondary | ICD-10-CM | POA: Diagnosis not present

## 2021-07-29 DIAGNOSIS — I10 Essential (primary) hypertension: Secondary | ICD-10-CM | POA: Diagnosis not present

## 2021-07-29 DIAGNOSIS — G8929 Other chronic pain: Secondary | ICD-10-CM | POA: Diagnosis not present

## 2021-07-29 DIAGNOSIS — M791 Myalgia, unspecified site: Secondary | ICD-10-CM | POA: Diagnosis not present

## 2021-07-29 DIAGNOSIS — G8921 Chronic pain due to trauma: Secondary | ICD-10-CM | POA: Diagnosis not present

## 2021-07-29 DIAGNOSIS — M542 Cervicalgia: Secondary | ICD-10-CM | POA: Diagnosis not present

## 2021-07-29 NOTE — Telephone Encounter (Signed)
Pt overdue for f/u. ?Pt hx of cancelling/no show appointments for refills. ?Please advise for refill pt has future appointment scheduled for 09/2021 with [facility]. ? ?

## 2021-08-07 ENCOUNTER — Other Ambulatory Visit: Payer: Self-pay | Admitting: Cardiovascular Disease

## 2021-08-07 ENCOUNTER — Other Ambulatory Visit: Payer: Self-pay | Admitting: Internal Medicine

## 2021-08-07 DIAGNOSIS — J4520 Mild intermittent asthma, uncomplicated: Secondary | ICD-10-CM

## 2021-08-07 MED ORDER — ALBUTEROL SULFATE HFA 108 (90 BASE) MCG/ACT IN AERS: 1.0000 | INHALATION_SPRAY | Freq: Four times a day (QID) | RESPIRATORY_TRACT | 3 refills | Status: DC | PRN

## 2021-08-08 ENCOUNTER — Telehealth: Payer: Self-pay

## 2021-08-08 ENCOUNTER — Other Ambulatory Visit: Payer: Self-pay | Admitting: Cardiovascular Disease

## 2021-08-08 DIAGNOSIS — F411 Generalized anxiety disorder: Secondary | ICD-10-CM

## 2021-08-08 DIAGNOSIS — F331 Major depressive disorder, recurrent, moderate: Secondary | ICD-10-CM

## 2021-08-08 DIAGNOSIS — F4310 Post-traumatic stress disorder, unspecified: Secondary | ICD-10-CM

## 2021-08-08 MED ORDER — VENLAFAXINE HCL ER 37.5 MG PO CP24: 37.5000 mg | ORAL_CAPSULE | Freq: Every day | ORAL | 0 refills | Status: DC

## 2021-08-08 NOTE — Telephone Encounter (Signed)
I have sent venlafaxine to pharmacy. 

## 2021-08-08 NOTE — Telephone Encounter (Signed)
received fax requesting a 90 day supply of the venlafaxine hcl

## 2021-08-15 ENCOUNTER — Telehealth: Payer: Medicare Other | Admitting: Psychiatry

## 2021-08-15 ENCOUNTER — Encounter: Payer: Self-pay | Admitting: Psychiatry

## 2021-08-15 ENCOUNTER — Telehealth (INDEPENDENT_AMBULATORY_CARE_PROVIDER_SITE_OTHER): Payer: Medicare Other | Admitting: Psychiatry

## 2021-08-15 DIAGNOSIS — G4701 Insomnia due to medical condition: Secondary | ICD-10-CM | POA: Diagnosis not present

## 2021-08-15 DIAGNOSIS — F1590 Other stimulant use, unspecified, uncomplicated: Secondary | ICD-10-CM

## 2021-08-15 DIAGNOSIS — F331 Major depressive disorder, recurrent, moderate: Secondary | ICD-10-CM | POA: Diagnosis not present

## 2021-08-15 DIAGNOSIS — F4310 Post-traumatic stress disorder, unspecified: Secondary | ICD-10-CM | POA: Diagnosis not present

## 2021-08-15 DIAGNOSIS — F411 Generalized anxiety disorder: Secondary | ICD-10-CM

## 2021-08-15 MED ORDER — VENLAFAXINE HCL ER 75 MG PO CP24: 75.0000 mg | ORAL_CAPSULE | Freq: Every day | ORAL | 0 refills | Status: DC

## 2021-08-15 MED ORDER — MIRTAZAPINE 15 MG PO TABS: 22.5000 mg | ORAL_TABLET | Freq: Every day | ORAL | 0 refills | Status: DC

## 2021-08-15 NOTE — Progress Notes (Signed)
**Cheyenne [name] [name]** Virtual Visit via Video Cheyenne [name]  I connected with Cheyenne Cheyenne [name] on 08/15/21 at  2:00 PM EDT by a video enabled telemedicine application and verified that I am speaking with the correct person using two identifiers.  Location Provider Location : [facility] Patient Location : Home  Participants: Patient , Provider   I discussed the limitations of evaluation and management by telemedicine and the availability of in person appointments. The patient expressed understanding and agreed to proceed.   I discussed the assessment and treatment plan with the patient. The patient was provided an opportunity to ask questions and all were answered. The patient agreed with the plan and demonstrated an understanding of the instructions.   The patient was advised to call back or seek an in-person evaluation if the symptoms worsen or if the condition fails to improve as anticipated.  Video connection was lost at more than 50% of the duration of the visit, at which time the remainder of the visit was completed through audio only    Cheyenne Cheyenne [name]  08/15/2021 6:08 PM Cheyenne Cheyenne [name]  MRN:  [id number]  Chief Complaint:  Chief Complaint  Patient presents with   Follow-up: 56 year old Caucasian female, widowed, has a history of PTSD, MDD, GAD presented for medication management.   HPI: Cheyenne Cheyenne [name] is a 56 year old Caucasian female, widowed on disability, has a history of PTSD, MDD, GAD, insomnia, caffeine use disorder, history of prolonged QT syndrome, vitamin B12 deficiency, multiple other medical problems was evaluated by telemedicine today.  Patient reports she continues to struggle with anxiety, worries about getting out of her apartment.  Does not want to be around a lot of people.  Has to force herself to do so.  Patient also reports she continues to struggle with lack of motivation, low energy during the day.  Patient reports she is aware that she needs to work with her therapist on a more  frequent basis.  Does have upcoming appointment.  Sleep continues to be interrupted at night likely due to her pain .  Mirtazapine does help to some extent.  Currently compliant on the mirtazapine and venlafaxine.  Agreeable to dosage increase of venlafaxine.  Denies side effects.  Patient denies any suicidality, homicidality or perceptual disturbances.  Patient denies any other concerns today.  Visit Diagnosis:    ICD-10-CM   1. PTSD (post-traumatic stress disorder)  F43.10 venlafaxine XR (EFFEXOR XR) 75 MG 24 hr capsule    2. GAD (generalized anxiety disorder)  F41.1 venlafaxine XR (EFFEXOR XR) 75 MG 24 hr capsule    3. MDD (major depressive disorder), recurrent episode, moderate (HCC)  F33.1 venlafaxine XR (EFFEXOR XR) 75 MG 24 hr capsule    4. Insomnia due to medical condition  G47.01 mirtazapine (REMERON) 15 MG tablet   mood, pain, caffeine use ,lack of sleep hygiene    5. 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  Past trials of Paxil, Cymbalta, Valium, melatonin, trazodone, Ambien, Wellbutrin, Abilify.  Past Medical History:  Past Medical History:  Diagnosis Date   (HFpEF) heart failure with preserved ejection fraction ([place])    a. Echo 2014: EF 65-70%, nl WM, mildly dilated LA, PASP nl; b. 12/2014 Echo: EF 65-70%, no rwma, mod septal hypertrophy w/o LVOT gradient or SAM; c. 07/2017 Echo: EF 55-60%, no rwma, mildly dil RV w/ nl syst fxn. Mildly dil RA. Dilated IVC w/ elevated CVP.  Triv post effusion.   Anxiety    Asthma    Chronic pain    on methadone, managed by Dr. Primus Bravo   Concussion    hx of 4   Coronary artery disease, non-occlusive    a. LHC 1/18: proximal to mid LAD 40% stenosed, mid LAD 30% stenosed, mid RCA 20% stenosed, distal RCA 20% stenosed, EF 55-65%, LVEDP normal   Depression    DJD (degenerative joint disease), multiple sites    History of shingles    Hypertension     Iron deficiency anemia    Long QT interval    Obesity    Palpitations    a. 24 hour Holter: NSR, sinus brady down to 48, occasional PVCs & couplets, 8 beats NSVT; b. 30 day event monitor 2015: NSR with rare PVC.   Psoriasis    Syncope and collapse    Vitamin D deficiency    Wears dentures    full upper and lower    Past Surgical History:  Procedure Laterality Date   ABDOMINOPLASTY     tummy tuck ? year    BARIATRIC SURGERY  2001   CARDIAC CATHETERIZATION Left 04/16/2016   Procedure: Left Heart Cath and Coronary Angiography;  Surgeon: [name], MD;  Location: [facility];  Service: Cardiovascular;  Laterality: Left;   CHOLECYSTECTOMY  2001   COLONOSCOPY WITH PROPOFOL N/A 09/28/2018   Procedure: COLONOSCOPY WITH PROPOFOL;  Surgeon: Virgel Manifold, MD;  Location: [facility];  Service: Endoscopy;  Laterality: N/A;   ESOPHAGOGASTRODUODENOSCOPY (EGD) WITH PROPOFOL N/A 09/28/2018   Procedure: ESOPHAGOGASTRODUODENOSCOPY (EGD) WITH BIOPSY;  Surgeon: Virgel Manifold, MD;  Location: [facility];  Service: Endoscopy;  Laterality: N/A;   EXTERNAL FIXATION LEG Left 10/29/2020   Procedure: EXTERNAL FIXATION LEFT KNEE;  Surgeon: Altamese Skyline View, MD;  Location: [facility];  Service: Orthopedics;  Laterality: Left;   GALLBLADDER SURGERY     GASTRIC BYPASS  2001   GASTROPLASTY      Family Psychiatric History: Reviewed family psychiatric history from progress Cheyenne [name] on 10/17/2017.  Family History:  Family History  Problem Relation Age of Onset   Heart attack Mother    Cancer Mother        pancreatitic 49   Early death Mother    Heart attack Father 53       MI   Early death Father    Heart disease Father    Heart attack Brother    Heart disease Brother    Arthritis Brother    Depression Brother    Diabetes Brother    Heart attack Maternal Grandmother    Cancer Maternal Grandmother        pancreatitic    Heart disease Maternal Grandmother    Lung cancer  Maternal Grandmother    Pancreatic cancer Maternal Grandmother    Cancer Maternal Uncle        pancreatitic    Cancer Paternal Grandmother        ? type    Diabetes Paternal Grandmother    Cancer Maternal Uncle        pancreatitic    Breast cancer Maternal Aunt    Cancer Maternal Aunt        GYN   Cancer Maternal Aunt        GYN    Social History: Reviewed social history from progress Cheyenne [name] on 10/17/2017. Social History   Socioeconomic History   Marital status: Widowed    Spouse name: Not  on file   Number of children: 1   Years of education: assoc degree   Highest education level: Associate degree: occupational, Hotel manager, or vocational program  Occupational History   Not on file  Tobacco Use   Smoking status: Never   Smokeless tobacco: Never  Vaping Use   Vaping Use: Never used  Substance and Sexual Activity   Alcohol use: No    Alcohol/week: 0.0 standard drinks    Comment: holidays   Drug use: No   Sexual activity: Not Currently  Other Topics Concern   Not on file  Social History Narrative   Adopted daughter [name] [phone] (now lives in [place] going to [place] state has apt there)   Significant other mac [phone], former husband died    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    Penicillins Hives, Shortness Of Breath and Rash    Has patient had a PCN reaction causing immediate rash, facial/tongue/throat swelling, SOB or lightheadedness with hypotension: Yes Has patient had a PCN reaction causing severe rash involving  mucus membranes or skin necrosis: No Has patient had a PCN reaction that required hospitalization No Has patient had a PCN reaction occurring within the last 10 years: No If all of the above answers are "NO", then may proceed with Cephalosporin use.    Bee Venom     Metabolic Disorder Labs: Lab Results  Component Value Date   HGBA1C 5.1 03/01/2021   MPG 100 03/01/2021   No results found for: PROLACTIN Lab Results  Component Value Date   CHOL 122 03/01/2021   TRIG 86 03/01/2021   HDL 43 (L) 03/01/2021   CHOLHDL 2.8 03/01/2021   VLDL 13 12/07/2019   LDLCALC 62 03/01/2021   LDLCALC 47 12/07/2019   Lab Results  Component Value Date   TSH 3.19 04/18/2021   TSH 5.22 (H) 03/01/2021    Therapeutic Level Labs: No results found for: LITHIUM No results found for: VALPROATE No components found for:  CBMZ  Current Medications: Current Outpatient Medications  Medication Sig Dispense Refill   albuterol (PROAIR HFA) 108 (90 Base) MCG/ACT inhaler Inhale 1-2 puffs into the lungs every 6 (six) hours as needed for wheezing or shortness of breath. 54 g 3   baclofen (LIORESAL) 10 MG tablet Take 10 mg by mouth 3 (three) times daily as needed for muscle spasms.     Biotin 10 MG CAPS Take 10 mg by mouth daily.     carvedilol (COREG) 6.25 MG tablet Take 1 tablet (6.25 mg total) by mouth 2 (two) times daily with a meal. 60 tablet 0   D3-50 1.25 MG (50000 UT) capsule TAKE 1 CAPSULE (50,000 UNITS TOTAL) BY MOUTH ONCE A WEEK. D3 13 capsule 1   diclofenac Sodium (VOLTAREN) 1 % GEL Apply topically.     gabapentin (NEURONTIN) 400 MG capsule LIMIT 2 TABLETS IN THE A.M. AND MIDDAY AND 3 TABLETS EACH EVENING 630 capsule 3   isosorbide mononitrate (  IMDUR) 60 MG 24 hr tablet TAKE ONE TABLET BY MOUTH DAILY AT 9 AM (PLEASE SCHEDULE OFFICE VISIT FOR FURTHER REFILLS) 30 tablet 0   Magnesium Oxide 400 MG CAPS Take 1 capsule (400 mg total) by mouth daily. 30 capsule 6   methadone (DOLOPHINE) 10 MG tablet Limit  1-2 tablets by mouth 2-3 times per day if tolerated 180 tablet 0   mupirocin ointment (BACTROBAN) 2 % Apply 1 application topically 2 (two) times daily. Left 2x per day 30 g 0   nitroGLYCERIN (NITROSTAT) 0.4 MG SL tablet PLACE 1 TABLET UNDER THE TONGUE EVERY 5 MINUTES AS NEEDED FOR CHEST PAIN. AFTER 2ND DOSE CALL 911 25 tablet 2   rosuvastatin (CRESTOR) 10 MG tablet Take 1 tablet (10 mg total) by mouth daily. NO FURTHER REFILLS UNTIL SEEN IN OFFICE. THANK YOU! 30 tablet 0   venlafaxine XR (EFFEXOR XR) 75 MG 24 hr capsule Take 1 capsule (75 mg total) by mouth daily with breakfast. 90 capsule 0   collagenase (SANTYL) ointment Apply 1 application topically daily. X 1 week at night left abdomen (Patient not taking: Reported on 08/15/2021) 15 g 0   mirtazapine (REMERON) 15 MG tablet Take 1.5 tablets (22.5 mg total) by mouth at bedtime. 135 tablet 0   Current Facility-Administered Medications  Medication Dose Route Frequency Provider Last Rate Last Admin   cyanocobalamin ((VITAMIN B-12)) injection 1,000 mcg  1,000 mcg Intramuscular Q30 days McLean-Scocuzza, Nino Glow, MD   1,000 mcg at 03/01/21 1520     Musculoskeletal: Strength & Muscle Tone:  UTA Gait & Station:  Seated Patient leans: N/A  Psychiatric Specialty Exam: Review of Systems  Musculoskeletal:  Positive for arthralgias and back pain.  Psychiatric/Behavioral:  Positive for sleep disturbance. The patient is nervous/anxious.   All other systems reviewed and are negative.  Last menstrual period 02/18/2016.There is no height or weight on file to calculate BMI.  General Appearance: video not clear  Eye Contact:  video not clear  Speech:  Clear and Coherent  Volume:  Normal  Mood:  Anxious, lack of motivation  Affect:  UTA  Thought Process:  Goal Directed and Descriptions of Associations: Intact  Orientation:  Full (Time, Place, and Person)  Thought Content: Logical   Suicidal Thoughts:  No  Homicidal Thoughts:  No  Memory:  Immediate;    Fair Recent;   Fair Remote;   Fair  Judgement:  Fair  Insight:  Fair  Psychomotor Activity:  Normal  Concentration:  Concentration: Fair and Attention Span: Fair  Recall:  [employer] of Knowledge: Fair  Language: Fair  Akathisia:  No  Handed:  Right  AIMS (if indicated): not done  Assets:  Communication Skills Desire for Improvement Housing Social Support  ADL's:  Intact  Cognition: WNL  Sleep:  restless   Screenings: AIMS    Flowsheet Row Video Visit from 07/15/2021 in [facility] Total Score 0      [place] from 05/23/2021 in [facility] Video Visit from 12/13/2020 in [facility] Video Visit from 02/15/2020 in [facility] Visit from 07/26/2019 in [facility] Office Visit from 09/21/2015 in [facility]  Total GAD-7 Score _0 Mini-Mental    [facility] Visit from 10/30/2017 in [facility]  Total Score (max 30 points ) 27  PHQ2-9    Flowsheet Row Video Visit from 07/15/2021 in [facility] Counselor from 07/10/2021 in [facility] Video Visit from 06/04/2021 in [facility] Counselor from 05/23/2021 in [facility] Office Visit from 05/14/2021 in [facility]  PHQ-2 Total Score _0 PHQ-9 Total Score _1 Flowsheet Row Video Visit from 07/15/2021 in [facility] Counselor from 07/10/2021 in [facility] Counselor from 05/23/2021 in [facility] No Risk No Risk No Risk        Assessment and Plan: ABBEGAIL MATUSKA is a 56 year old Caucasian female, widowed, has a  history of PTSD, depression, multiple medical problems including status post tibial plateau fracture status post ORIF, currently continues to have depression, anxiety symptoms will benefit from the following plan.  Plan PTSD-stable Continue CBT  GAD-improving Mirtazapine 22.5 mg p.o. nightly Increase Effexor extended release to 75 mg p.o. daily in the morning Continue CBT with Ms. Christina Hussami  MDD-unstable Increase Effexor extended release to 75 mg p.o. daily in the morning Continue mirtazapine as prescribed Continue CBT  Insomnia-improving Continue sleep hygiene techniques Patient may need sufficient pain management.  Patient to cut back on caffeine use. Mirtazapine 22.5 mg p.o. nightly   Caffeine use disorder-unstable Will monitor closely  Follow-up in clinic in 4 -6 weeks or sooner if needed.  Collaboration of Care: Collaboration of Care: Referral or follow-up with counselor/therapist AEB encouraged to follow up with therapist. Patient with prolonged QT syndrome-advised to follow up with cardiology-will coordinate care.  Has upcoming appointment.  Patient/Guardian was advised Release of Information must be obtained prior to any record release in order to collaborate their care with an outside provider. Patient/Guardian was advised if they have not already done so to contact the registration department to sign all necessary forms in order for [place] to release information regarding their care.   Consent: Patient/Guardian gives verbal consent for treatment and assignment of benefits for services provided during this visit. Patient/Guardian expressed understanding and agreed to proceed.   This Cheyenne [name] was generated in part or whole with voice recognition software. Voice recognition is usually quite accurate but there are transcription errors that can and very often do occur. I apologize for any typographical errors that were not detected and corrected.      Ursula Alert,  MD 08/15/2021, 6:08 PM

## 2021-08-23 ENCOUNTER — Ambulatory Visit: Payer: Medicare Other | Admitting: Cardiovascular Disease

## 2021-08-26 DIAGNOSIS — M5440 Lumbago with sciatica, unspecified side: Secondary | ICD-10-CM | POA: Diagnosis not present

## 2021-08-26 DIAGNOSIS — G8929 Other chronic pain: Secondary | ICD-10-CM | POA: Diagnosis not present

## 2021-08-26 DIAGNOSIS — I10 Essential (primary) hypertension: Secondary | ICD-10-CM | POA: Diagnosis not present

## 2021-08-26 DIAGNOSIS — M542 Cervicalgia: Secondary | ICD-10-CM | POA: Diagnosis not present

## 2021-08-26 DIAGNOSIS — G8921 Chronic pain due to trauma: Secondary | ICD-10-CM | POA: Diagnosis not present

## 2021-08-26 DIAGNOSIS — M791 Myalgia, unspecified site: Secondary | ICD-10-CM | POA: Diagnosis not present

## 2021-08-26 DIAGNOSIS — Z79891 Long term (current) use of opiate analgesic: Secondary | ICD-10-CM | POA: Diagnosis not present

## 2021-08-26 DIAGNOSIS — G894 Chronic pain syndrome: Secondary | ICD-10-CM | POA: Diagnosis not present

## 2021-08-27 ENCOUNTER — Other Ambulatory Visit: Payer: Medicare Other

## 2021-08-27 ENCOUNTER — Encounter: Payer: Self-pay | Admitting: Internal Medicine

## 2021-08-28 ENCOUNTER — Other Ambulatory Visit: Payer: Self-pay | Admitting: Cardiovascular Disease

## 2021-08-28 NOTE — Telephone Encounter (Signed)
Please advise for refill pt overdue f/u hasn't been seen since 2021. Pt hx of cancelling appointments.

## 2021-08-29 ENCOUNTER — Ambulatory Visit (INDEPENDENT_AMBULATORY_CARE_PROVIDER_SITE_OTHER): Payer: Medicare Other | Admitting: Licensed Clinical Social Worker

## 2021-08-29 DIAGNOSIS — F331 Major depressive disorder, recurrent, moderate: Secondary | ICD-10-CM | POA: Diagnosis not present

## 2021-08-29 DIAGNOSIS — F4310 Post-traumatic stress disorder, unspecified: Secondary | ICD-10-CM

## 2021-08-29 NOTE — Progress Notes (Signed)
Virtual Visit via Video Note  I connected with IDELIA CAUDELL on 08/29/21 at 10:00 AM EDT by a video enabled telemedicine application and verified that I am speaking with the correct person using two identifiers.  Location: Patient: home Provider: [facility]   I discussed the limitations of evaluation and management by telemedicine and the availability of in person appointments. The patient expressed understanding and agreed to proceed.   I discussed the assessment and treatment plan with the patient. The patient was provided an opportunity to ask questions and all were answered. The patient agreed with the plan and demonstrated an understanding of the instructions.   The patient was advised to call back or seek an in-person evaluation if the symptoms worsen or if the condition fails to improve as anticipated.  I provided 40 minutes of non-face-to-face time during this encounter.   Matie Dimaano R Laniece Hornbaker, LCSW  THERAPIST PROGRESS NOTE  Session Time: 10-1040a  Participation Level: Active  Behavioral Response: NeatAlertDepressed  Type of Therapy: Individual Therapy  Treatment Goals addressed:  Problem: Reduce the negative impact trauma related symptoms have on social, occupational, and family functioning per pt self report 3 out of 5 sessions documented. PTSD-Trauma Disorder CCP Problem  1  Goal: LTG: Reduce frequency, intensity, and duration of PTSD symptoms so daily functioning is improved: Input needed on appropriate metric.  per pt self report Outcome: Not Progressing Note: Pt currently reporting increase in depression symptoms Goal: LTG: Elimination of maladaptive behaviors and thinking patterns which interfere with resolution of trauma: per pt self report Outcome: Progressing Intervention: Assist with relaxation techniques, as appropriate (deep breathing exercises, meditation, guided imagery) Note: Reviewed  Intervention: Encourage verbalization of  feelings/concerns/expectations Note: Explored  Intervention: Encourage self-care activities Note: Reviewed Intervention: Encourage compliance with prescribed medication regimen Note: Encouraged compliance--recent med changes  Problem: Reduce the negative impact trauma related symptoms have on social, occupational, and family functioning per pt self report 3 out of 5 sessions documented. PTSD-Trauma Disorder CCP Problem  1  Goal: LTG: Reduce frequency, intensity, and duration of PTSD symptoms so daily functioning is improved: Input needed on appropriate metric.  per pt self report Outcome: Progressing Goal: LTG: Elimination of maladaptive behaviors and thinking patterns which interfere with resolution of trauma: per pt self report Outcome: Progressing  ProgressTowards Goals: Progressing overall--some relapse in depression symptoms since last session.  Interventions: CBT and Other: trauma focused  Summary: ANELLE PARLOW is a 56 y.o. female who presents with continuing symptoms related to trauma. Pt reports that overall mood has been up and down and that pt is experiencing stress/anxiety at times.   Allowed pt to explore and express thoughts and feelings associated with recent life situations and external stressors.Patient reports that he is continuing to experience stress associated with ongoing health concerns and headaches. Patient reports that overall sleep quality and quantity has been fluctuating at this time. Patient reports that his primary care physician is managing overall health concerns.  Patient reports that he is currently receiving multiple services from the [facility] including psychiatric care, group counseling, and now he is going to start individual counseling. Discussed with patient the complication of having to individual therapist, but patient Requests to continue counseling with this clinician in addition to individual counseling through [place] services.  Patient reports  that he is fearful about dementia symptoms in the future. Patient reports that he has several family members that have struggled with dementia and he is fearful that he will as well. Discussed stimulating  activities that patient can do to keep brain stimulated to help with depression symptoms and present memory. Patient reports that he does not have any current concerns about memory deficits at this time other than day-to-day forgetfulness.  Continued recommendations are as follows: self care behaviors, positive social engagements, focusing on overall work/home/life balance, and focusing on positive physical and emotional wellness.    Suicidal/Homicidal: No  Therapist Response: Developed goals for pt and continuing therapy  Plan: Return again in 4 weeks.  Diagnosis: PTSD (post-traumatic stress disorder)  MDD (major depressive disorder), recurrent episode, moderate (HCC)  Collaboration of Care: Other Pt to continue with psychiatrist of record, Dr. Elna Breslow  Patient/Guardian was advised Release of Information must be obtained prior to any record release in order to collaborate their care with an outside provider. Patient/Guardian was advised if they have not already done so to contact the registration department to sign all necessary forms in order for [place] to release information regarding their care.   Consent: Patient/Guardian gives verbal consent for treatment and assignment of benefits for services provided during this visit. Patient/Guardian expressed understanding and agreed to proceed.   Ernest Haber Kimble Hitchens, LCSW 08/29/2021

## 2021-08-29 NOTE — Plan of Care (Signed)
  Problem: Reduce the negative impact trauma related symptoms have on social, occupational, and family functioning per pt self report 3 out of 5 sessions documented. PTSD-Trauma Disorder CCP Problem  1  Goal: LTG: Reduce frequency, intensity, and duration of PTSD symptoms so daily functioning is improved: Input needed on appropriate metric.  per pt self report Outcome: Not Progressing Note: Pt currently reporting increase in depression symptoms Goal: LTG: Elimination of maladaptive behaviors and thinking patterns which interfere with resolution of trauma: per pt self report Outcome: Progressing Intervention: Assist with relaxation techniques, as appropriate (deep breathing exercises, meditation, guided imagery) Note: Reviewed  Intervention: Encourage verbalization of feelings/concerns/expectations Note: Explored  Intervention: Encourage self-care activities Note: Reviewed Intervention: Encourage compliance with prescribed medication regimen Note: Encouraged compliance--recent med changes

## 2021-08-30 ENCOUNTER — Ambulatory Visit: Payer: Medicare Other | Admitting: Internal Medicine

## 2021-09-04 ENCOUNTER — Other Ambulatory Visit: Payer: Medicare Other

## 2021-09-06 ENCOUNTER — Other Ambulatory Visit: Payer: Self-pay | Admitting: Cardiovascular Disease

## 2021-09-10 ENCOUNTER — Other Ambulatory Visit: Payer: Self-pay

## 2021-09-10 DIAGNOSIS — R55 Syncope and collapse: Secondary | ICD-10-CM

## 2021-09-10 DIAGNOSIS — I2510 Atherosclerotic heart disease of native coronary artery without angina pectoris: Secondary | ICD-10-CM

## 2021-09-10 MED ORDER — CARVEDILOL 6.25 MG PO TABS: 6.2500 mg | ORAL_TABLET | Freq: Two times a day (BID) | ORAL | 1 refills | Status: DC

## 2021-09-16 ENCOUNTER — Other Ambulatory Visit: Payer: Self-pay | Admitting: Psychiatry

## 2021-09-16 DIAGNOSIS — F4310 Post-traumatic stress disorder, unspecified: Secondary | ICD-10-CM

## 2021-09-16 DIAGNOSIS — F411 Generalized anxiety disorder: Secondary | ICD-10-CM

## 2021-09-16 DIAGNOSIS — F331 Major depressive disorder, recurrent, moderate: Secondary | ICD-10-CM

## 2021-09-20 ENCOUNTER — Ambulatory Visit: Payer: Medicare Other | Admitting: Cardiovascular Disease

## 2021-09-23 ENCOUNTER — Telehealth: Payer: Self-pay | Admitting: Internal Medicine

## 2021-09-23 NOTE — Telephone Encounter (Signed)
Copied from CRM [id number]. Topic: Medicare AWV >> Sep 23, 2021 10:05 AM Payton Doughty wrote: Reason for CRM: Left message for patient to schedule Annual Wellness Visit.  Please schedule with Nurse Health Advisor Denisa O'Brien-Blaney, LPN at [facility]. This appt can be telephone or office visit.  Please call [phone] ask for [facility]

## 2021-09-24 ENCOUNTER — Other Ambulatory Visit: Payer: Self-pay | Admitting: Internal Medicine

## 2021-09-24 DIAGNOSIS — I2510 Atherosclerotic heart disease of native coronary artery without angina pectoris: Secondary | ICD-10-CM

## 2021-09-24 DIAGNOSIS — R55 Syncope and collapse: Secondary | ICD-10-CM

## 2021-09-27 ENCOUNTER — Other Ambulatory Visit: Payer: Self-pay | Admitting: Cardiovascular Disease

## 2021-09-27 NOTE — Telephone Encounter (Signed)
Patient moved appt again (per cancellation note-due to surgery recovery). Please advise if OK to refill or defer to PCP until seen in office. Last seen by Dr. Mariah Milling in Nov 2021. Thank you!

## 2021-10-03 ENCOUNTER — Other Ambulatory Visit: Payer: Self-pay | Admitting: Cardiovascular Disease

## 2021-10-03 ENCOUNTER — Encounter: Payer: Self-pay | Admitting: Psychiatry

## 2021-10-03 ENCOUNTER — Telehealth (INDEPENDENT_AMBULATORY_CARE_PROVIDER_SITE_OTHER): Payer: Medicare Other | Admitting: Psychiatry

## 2021-10-03 DIAGNOSIS — G4701 Insomnia due to medical condition: Secondary | ICD-10-CM | POA: Diagnosis not present

## 2021-10-03 DIAGNOSIS — F411 Generalized anxiety disorder: Secondary | ICD-10-CM

## 2021-10-03 DIAGNOSIS — F4310 Post-traumatic stress disorder, unspecified: Secondary | ICD-10-CM

## 2021-10-03 DIAGNOSIS — F331 Major depressive disorder, recurrent, moderate: Secondary | ICD-10-CM

## 2021-10-03 DIAGNOSIS — F1590 Other stimulant use, unspecified, uncomplicated: Secondary | ICD-10-CM

## 2021-10-03 NOTE — Progress Notes (Signed)
Virtual Visit via Video Note  I connected with Cheyenne Gray on 10/03/21 at  4:00 PM EDT by a video enabled telemedicine application and verified that I am speaking with the correct person using two identifiers.  Location Provider Location : [facility] Patient Location : Home  Participants: Patient , Provider    I discussed the limitations of evaluation and management by telemedicine and the availability of in person appointments. The patient expressed understanding and agreed to proceed.   I discussed the assessment and treatment plan with the patient. The patient was provided an opportunity to ask questions and all were answered. The patient agreed with the plan and demonstrated an understanding of the instructions.   The patient was advised to call back or seek an in-person evaluation if the symptoms worsen or if the condition fails to improve as anticipated.   [facility] [facility] MD OP Progress Note  10/03/2021 4:21 PM Cheyenne Gray  MRN:  [id number]  Chief Complaint:  Chief Complaint  Patient presents with   Follow-up: 56 year old Caucasian female, widowed, has a history of PTSD, MDD, GAD, presented for medication management.   HPI: Cheyenne Gray is a 56 year old Caucasian female, widowed, on disability, has a history of PTSD, MDD, GAD, insomnia, caffeine use disorder, history of prolonged QT syndrome, vitamin B12 deficiency, multiple other medical problems was evaluated by telemedicine today.  Patient today reports she is currently improving with regards to her mood.  She has been doing more activities, getting out more, going to the grocery stores when ever possible.  She however reports she continues to have residual depressive symptoms like sadness, low motivation, low energy.  She also has anxiety symptoms.  Recently her anxiety was triggered by situational stressors however she was able to cope with it.  Patient continues to have sleep problems.  Reports she wakes up after around 4 hours  of sleep and is unable to fall back asleep for 1 to 2 hours.  She does continue to use a lot of caffeine during the day.  Has not been able to cut back on it.  Patient denies any suicidality, homicidality or perceptual disturbances.  Patient with history of prolonged QT syndrome, has upcoming cardiology appointment for evaluation.  Patient is currently compliant on her psychotropics, denies side effects.  Reports she is compliant with her psychotherapy sessions.  Denies any other concerns today.  Visit Diagnosis:    ICD-10-CM   1. PTSD (post-traumatic stress disorder)  F43.10     2. GAD (generalized anxiety disorder)  F41.1     3. MDD (major depressive disorder), recurrent episode, moderate (HCC)  F33.1     4. Insomnia due to medical condition  G47.01    Mood, pain, caffeine, sleep hygiene problems    5. 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  Past trials of Paxil, Cymbalta, Valium, melatonin, trazodone, Ambien, Wellbutrin, Abilify.  Past Medical History:  Past Medical History:  Diagnosis Date   (HFpEF) heart failure with preserved ejection fraction ([place])    a. Echo 2014: EF 65-70%, nl WM, mildly dilated LA, PASP nl; b. 12/2014 Echo: EF 65-70%, no rwma, mod septal hypertrophy w/o LVOT gradient or SAM; c. 07/2017 Echo: EF 55-60%, no rwma, mildly dil RV w/ nl syst fxn. Mildly dil RA. Dilated IVC w/ elevated CVP. Triv post effusion.   Anxiety    Asthma    Chronic pain  on methadone, managed by Dr. Primus Bravo   Concussion    hx of 4   Coronary artery disease, non-occlusive    a. LHC 1/18: proximal to mid LAD 40% stenosed, mid LAD 30% stenosed, mid RCA 20% stenosed, distal RCA 20% stenosed, EF 55-65%, LVEDP normal   Depression    DJD (degenerative joint disease), multiple sites    History of shingles    Hypertension    Iron deficiency anemia    Long QT interval    Obesity    Palpitations     a. 24 hour Holter: NSR, sinus brady down to 48, occasional PVCs & couplets, 8 beats NSVT; b. 30 day event monitor 2015: NSR with rare PVC.   Psoriasis    Syncope and collapse    Vitamin D deficiency    Wears dentures    full upper and lower    Past Surgical History:  Procedure Laterality Date   ABDOMINOPLASTY     tummy tuck ? year    BARIATRIC SURGERY  2001   CARDIAC CATHETERIZATION Left 04/16/2016   Procedure: Left Heart Cath and Coronary Angiography;  Surgeon: [name], MD;  Location: [facility];  Service: Cardiovascular;  Laterality: Left;   CHOLECYSTECTOMY  2001   COLONOSCOPY WITH PROPOFOL N/A 09/28/2018   Procedure: COLONOSCOPY WITH PROPOFOL;  Surgeon: Virgel Manifold, MD;  Location: [facility];  Service: Endoscopy;  Laterality: N/A;   ESOPHAGOGASTRODUODENOSCOPY (EGD) WITH PROPOFOL N/A 09/28/2018   Procedure: ESOPHAGOGASTRODUODENOSCOPY (EGD) WITH BIOPSY;  Surgeon: Virgel Manifold, MD;  Location: [facility];  Service: Endoscopy;  Laterality: N/A;   EXTERNAL FIXATION LEG Left 10/29/2020   Procedure: EXTERNAL FIXATION LEFT KNEE;  Surgeon: Altamese Wyano, MD;  Location: [facility];  Service: Orthopedics;  Laterality: Left;   GALLBLADDER SURGERY     GASTRIC BYPASS  2001   GASTROPLASTY      Family Psychiatric History: Reviewed family psychiatric history from progress note on 10/17/2017.  Family History:  Family History  Problem Relation Age of Onset   Heart attack Mother    Cancer Mother        pancreatitic 76   Early death Mother    Heart attack Father 36       MI   Early death Father    Heart disease Father    Heart attack Brother    Heart disease Brother    Arthritis Brother    Depression Brother    Diabetes Brother    Heart attack Maternal Grandmother    Cancer Maternal Grandmother        pancreatitic    Heart disease Maternal Grandmother    Lung cancer Maternal Grandmother    Pancreatic cancer Maternal Grandmother    Cancer  Maternal Uncle        pancreatitic    Cancer Paternal Grandmother        ? type    Diabetes Paternal Grandmother    Cancer Maternal Uncle        pancreatitic    Breast cancer Maternal Aunt    Cancer Maternal Aunt        GYN   Cancer Maternal Aunt        GYN    Social History: Reviewed social history from progress note on 10/17/2017. Social History   Socioeconomic History   Marital status: Widowed    Spouse name: Not on file   Number of children: 1   Years of education: assoc degree   Highest  education level: Associate degree: occupational, Hotel manager, or vocational program  Occupational History   Not on file  Tobacco Use   Smoking status: Never   Smokeless tobacco: Never  Vaping Use   Vaping Use: Never used  Substance and Sexual Activity   Alcohol use: No    Alcohol/week: 0.0 standard drinks of alcohol    Comment: holidays   Drug use: No   Sexual activity: Not Currently  Other Topics Concern   Not on file  Social History Narrative   Adopted daughter [name] [phone] (now lives in [place] going to [place] state has apt there)   Significant other mac [phone], former husband died    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    Never smoker    No guns   Wears seat belt    No caffeine   Social Determinants of Health   Financial Resource Strain: High Risk (11/06/2017)   Overall Financial Resource Strain (CARDIA)    Difficulty of Paying Living Expenses: Very hard  Food Insecurity: No Food Insecurity (11/06/2017)   Hunger Vital Sign    Worried About Running Out of Food in the Last Year: Never true    [place] in the Last Year: Never true  Transportation Needs: No Transportation Needs (11/06/2017)   PRAPARE - Hydrologist (Medical): No    Lack of Transportation (Non-Medical): No  Physical Activity: Inactive (11/06/2017)   Exercise Vital Sign    Days of Exercise per Week: 0 days    Minutes of Exercise per Session: 0 min  Stress: Stress Concern Present  (11/06/2017)   [place]    Feeling of Stress : Very much  Social Connections: Socially Isolated (11/06/2017)   Social Connection and Isolation Panel [NHANES]    Frequency of Communication with Friends and Family: Once a week    Frequency of Social Gatherings with Friends and Family: Never    Attends Religious Services: Never    Marine scientist or Organizations: No    Attends Archivist Meetings: Never    Marital Status: Widowed    Allergies:  Allergies  Allergen Reactions   Covid-19 (Mrna) Vaccine Therapist, music) [Covid-19 (Mrna) Vaccine] Itching, Palpitations and Other (See Comments)    Throat closing.    Penicillins Hives, Shortness Of Breath and Rash    Has patient had a PCN reaction causing immediate rash, facial/tongue/throat swelling, SOB or lightheadedness with hypotension: Yes Has patient had a PCN reaction causing severe rash involving mucus membranes or skin necrosis: No Has patient had a PCN reaction that required hospitalization No Has patient had a PCN reaction occurring within the last 10 years: No If all of the above answers are "NO", then may proceed with Cephalosporin use.    Bee Venom     Metabolic Disorder Labs: Lab Results  Component Value Date   HGBA1C 5.1 03/01/2021   MPG 100 03/01/2021   No results found for: "PROLACTIN" Lab Results  Component Value Date   CHOL 122 03/01/2021   TRIG 86 03/01/2021   HDL 43 (L) 03/01/2021   CHOLHDL 2.8 03/01/2021   VLDL 13 12/07/2019   LDLCALC 62 03/01/2021   LDLCALC 47 12/07/2019   Lab Results  Component Value Date   TSH 3.19 04/18/2021   TSH 5.22 (H) 03/01/2021    Therapeutic Level Labs: No results found for: "LITHIUM" No results found for: "VALPROATE" No results found for: "CBMZ"  Current Medications: Current Outpatient Medications  Medication Sig Dispense Refill   albuterol (PROAIR HFA) 108 (90 Base) MCG/ACT inhaler Inhale  1-2 puffs into the lungs every 6 (six) hours as needed for wheezing or shortness of breath. 54 g 3   Biotin 10 MG CAPS Take 10 mg by mouth daily.     carvedilol (COREG) 6.25 MG tablet TAKE 1 TABLET BY MOUTH TWICE A DAY 180 tablet 1   collagenase (SANTYL) ointment Apply 1 application topically daily. X 1 week at night left abdomen 15 g 0   D3-50 1.25 MG (50000 UT) capsule TAKE 1 CAPSULE (50,000 UNITS TOTAL) BY MOUTH ONCE A WEEK. D3 13 capsule 1   diclofenac Sodium (VOLTAREN) 1 % GEL Apply topically.     gabapentin (NEURONTIN) 400 MG capsule LIMIT 2 TABLETS IN THE A.M. AND MIDDAY AND 3 TABLETS EACH EVENING 630 capsule 3   isosorbide mononitrate (IMDUR) 60 MG 24 hr tablet TAKE ONE TABLET BY MOUTH DAILY AT 9 AM .PLEASE SCHEDULE OFFICE VISIT FOR FURTHER REFILLS. 21 tablet 0   Magnesium Oxide 400 MG CAPS Take 1 capsule (400 mg total) by mouth daily. 30 capsule 6   methadone (DOLOPHINE) 10 MG tablet Limit 1-2 tablets by mouth 2-3 times per day if tolerated 180 tablet 0   mirtazapine (REMERON) 15 MG tablet Take 1.5 tablets (22.5 mg total) by mouth at bedtime. 135 tablet 0   mupirocin ointment (BACTROBAN) 2 % Apply 1 application topically 2 (two) times daily. Left 2x per day 30 g 0   nitroGLYCERIN (NITROSTAT) 0.4 MG SL tablet PLACE 1 TABLET UNDER THE TONGUE EVERY 5 MINUTES AS NEEDED FOR CHEST PAIN. AFTER 2ND DOSE CALL 911 25 tablet 2   rosuvastatin (CRESTOR) 10 MG tablet TAKE ONE TABLET BY MOUTH DAILY AT 5 PM .NO FURTHER REFILLS UNTIL SEEN IN OFFICE. 21 tablet 0   tiZANidine (ZANAFLEX) 4 MG tablet Take 4 mg by mouth 2 (two) times daily.     venlafaxine XR (EFFEXOR XR) 75 MG 24 hr capsule Take 1 capsule (75 mg total) by mouth daily with breakfast. 90 capsule 0   Current Facility-Administered Medications  Medication Dose Route Frequency Provider Last Rate Last Admin   cyanocobalamin ((VITAMIN B-12)) injection 1,000 mcg  1,000 mcg Intramuscular Q30 days McLean-Scocuzza, Nino Glow, MD   1,000 mcg at 03/01/21  1520     Musculoskeletal: Strength & Muscle Tone:  UTA Gait & Station:  Seated Patient leans: N/A  Psychiatric Specialty Exam: Review of Systems  Psychiatric/Behavioral:  Positive for decreased concentration, dysphoric mood and sleep disturbance. The patient is nervous/anxious.   All other systems reviewed and are negative.   Last menstrual period 02/12/2016.There is no height or weight on file to calculate BMI.  General Appearance: Casual  Eye Contact:  Fair  Speech:  Normal Rate  Volume:  Normal  Mood:  Anxious and Dysphoric  Affect:  Appropriate  Thought Process:  Goal Directed and Descriptions of Associations: Intact  Orientation:  Full (Time, Place, and Person)  Thought Content: Logical   Suicidal Thoughts:  No  Homicidal Thoughts:  No  Memory:  Immediate;   Fair Recent;   Fair Remote;   Fair  Judgement:  Fair  Insight:  Fair  Psychomotor Activity:  Normal  Concentration:  Concentration: Fair and Attention Span: Fair  Recall:  [employer] of Knowledge: Fair  Language: Fair  Akathisia:  No  Handed:  Right  AIMS (if indicated): not done  Assets:  Armed forces logistics/support/administrative officer  Desire for Improvement Housing Transportation  ADL's:  Intact  Cognition: WNL  Sleep:   restless   Screenings: AIMS    Flowsheet Row Video Visit from 07/15/2021 in [facility] Total Score 0      GAD-7    Flowsheet Row Counselor from 05/23/2021 in [facility] Video Visit from 12/13/2020 in [facility] Video Visit from 02/15/2020 in [facility] Office Visit from 07/26/2019 in [facility] Office Visit from 09/21/2015 in [facility]  Total GAD-7 Score _0 Mini-Mental    [facility] Office Visit from 10/30/2017 in [facility]  Total Score (max 30 points ) 27      PHQ2-9     Flowsheet Row Video Visit from 10/03/2021 in [facility] Video Visit from 07/15/2021 in [facility] Counselor from 07/10/2021 in [facility] Video Visit from 06/04/2021 in [facility] Counselor from 05/23/2021 in [facility]  PHQ-2 Total Score _1 PHQ-9 Total Score _2 Flowsheet Row Video Visit from 10/03/2021 in [facility] Video Visit from 07/15/2021 in [facility] Counselor from 07/10/2021 in [facility] Low Risk No Risk No Risk        Assessment and Plan: Cheyenne Gray is a 56 year old Caucasian female, widowed, has a history of PTSD, depression, multiple medical problems including status post tibial plateau fracture status post ORIF, currently improving with regards to her mood although she continues to have residual depression and anxiety symptoms as well as sleep problems.  Plan as noted below.  Plan PTSD-stable Continue CBT.  GAD-improving Mirtazapine 22.5 mg p.o. nightly Effexor extended release 75 mg p.o. daily in the morning Continue CBT with Ms. Christina Hussami  MDD-some progress Patient will benefit from increased dosage of Effexor extended release.  Currently on 75 mg p.o. daily in the morning.  However await cardiology clearance next week and will consider increasing the dosage after that. Will coordinate care with cardiology. Continue mirtazapine as prescribed. Continue CBT  Insomnia-unstable Patient does not have a good sleep hygiene-advised to cut back on caffeine intake.  She will also need sufficient pain management. Mirtazapine 22.5 mg p.o. nightly  Caffeine use disorder-unstable Provided counseling.  Follow-up in clinic in 4 weeks or sooner if needed in person.  Collaboration of Care:  Collaboration of Care: Other patient encouraged to have cardiology clearance-she has upcoming appointment.  We will consider readjusting her venlafaxine dosage after cardiology clearance.  Patient/Guardian was advised Release of Information must be obtained prior to any record release in order to collaborate their care with an outside provider. Patient/Guardian was advised if they have not already done so to contact the registration department to sign all necessary forms in order for [place] to release information regarding their care.   Consent: Patient/Guardian gives verbal consent for treatment and assignment of benefits for services provided during this visit. Patient/Guardian expressed understanding and agreed to proceed.   This note was generated in part or whole with voice recognition software. Voice recognition is usually quite accurate but there are transcription errors that can and very often do occur. I apologize for any typographical errors that were not detected and corrected.      Roselie Cirigliano,  MD 10/04/2021, 8:17 AM

## 2021-10-08 ENCOUNTER — Other Ambulatory Visit: Payer: Self-pay | Admitting: Cardiovascular Disease

## 2021-10-08 ENCOUNTER — Other Ambulatory Visit: Payer: Medicare Other

## 2021-10-09 DIAGNOSIS — Z79891 Long term (current) use of opiate analgesic: Secondary | ICD-10-CM | POA: Diagnosis not present

## 2021-10-09 DIAGNOSIS — G8921 Chronic pain due to trauma: Secondary | ICD-10-CM | POA: Diagnosis not present

## 2021-10-09 DIAGNOSIS — M5440 Lumbago with sciatica, unspecified side: Secondary | ICD-10-CM | POA: Diagnosis not present

## 2021-10-09 DIAGNOSIS — M791 Myalgia, unspecified site: Secondary | ICD-10-CM | POA: Diagnosis not present

## 2021-10-09 DIAGNOSIS — G8929 Other chronic pain: Secondary | ICD-10-CM | POA: Diagnosis not present

## 2021-10-09 DIAGNOSIS — M542 Cervicalgia: Secondary | ICD-10-CM | POA: Diagnosis not present

## 2021-10-09 DIAGNOSIS — I10 Essential (primary) hypertension: Secondary | ICD-10-CM | POA: Diagnosis not present

## 2021-10-09 DIAGNOSIS — G894 Chronic pain syndrome: Secondary | ICD-10-CM | POA: Diagnosis not present

## 2021-10-10 ENCOUNTER — Ambulatory Visit: Payer: Medicare Other | Admitting: Medical

## 2021-10-15 ENCOUNTER — Ambulatory Visit (INDEPENDENT_AMBULATORY_CARE_PROVIDER_SITE_OTHER): Payer: Medicare Other | Admitting: Licensed Clinical Social Worker

## 2021-10-15 DIAGNOSIS — F4310 Post-traumatic stress disorder, unspecified: Secondary | ICD-10-CM

## 2021-10-15 DIAGNOSIS — F411 Generalized anxiety disorder: Secondary | ICD-10-CM | POA: Diagnosis not present

## 2021-10-15 NOTE — Progress Notes (Signed)
Virtual Visit via Video Note  I connected with Cheyenne Gray on 10/15/21 at  1:00 PM EDT by a video enabled telemedicine application and verified that I am speaking with the correct person using two identifiers.  Location: Patient: home Provider: remote office [place], [place])   I discussed the limitations of evaluation and management by telemedicine and the availability of in person appointments. The patient expressed understanding and agreed to proceed.   I discussed the assessment and treatment plan with the patient. The patient was provided an opportunity to ask questions and all were answered. The patient agreed with the plan and demonstrated an understanding of the instructions.   The patient was advised to call back or seek an in-person evaluation if the symptoms worsen or if the condition fails to improve as anticipated.  I provided minutes of non-face-to-face time during this encounter.   Blease Capaldi R Eulene Pekar, LCSW  THERAPIST PROGRESS NOTE  Session Time:    Participation Level: Active  Behavioral Response: NeatAlertDepressed  Type of Therapy: Individual Therapy  Treatment Goals addressed:  Problem: Reduce the negative impact trauma related symptoms have on social, occupational, and family functioning per pt self report 3 out of 5 sessions documented. PTSD-Trauma Disorder CCP Problem  1  Goal: LTG: Reduce frequency, intensity, and duration of PTSD symptoms so daily functioning is improved: Input needed on appropriate metric.  per pt self report Outcome: Not Progressing Note: Pt currently reporting increase in depression symptoms Goal: LTG: Elimination of maladaptive behaviors and thinking patterns which interfere with resolution of trauma: per pt self report Outcome: Progressing Intervention: Assist with relaxation techniques, as appropriate (deep breathing exercises, meditation, guided imagery) Note: Reviewed  Intervention: Encourage verbalization of  feelings/concerns/expectations Note: Explored  Intervention: Encourage self-care activities Note: Reviewed Intervention: Encourage compliance with prescribed medication regimen Note: Encouraged compliance--recent med changes  Problem: Reduce the negative impact trauma related symptoms have on social, occupational, and family functioning per pt self report 3 out of 5 sessions documented. PTSD-Trauma Disorder CCP Problem  1  Goal: LTG: Reduce frequency, intensity, and duration of PTSD symptoms so daily functioning is improved: Input needed on appropriate metric.  per pt self report Outcome: Progressing Goal: LTG: Elimination of maladaptive behaviors and thinking patterns which interfere with resolution of trauma: per pt self report Outcome: Progressing  ProgressTowards Goals: Progressing overall--some relapse in depression symptoms since last session.  Interventions: CBT and Other: trauma focused  Summary: Cheyenne Gray is a 56 y.o. female who presents with continuing symptoms related to trauma. Pt reports that overall mood has been up and down and that pt is experiencing stress/anxiety at times.   Continued recommendations are as follows: self care behaviors, positive social engagements, focusing on overall work/home/life balance, and focusing on positive physical and emotional wellness.    Suicidal/Homicidal: No  Therapist Response: Developed goals for pt and continuing therapy  Plan: Return again in 4 weeks.  Diagnosis: No diagnosis found.  Collaboration of Care: Other Pt to continue with psychiatrist of record, Dr. Elna Breslow  Patient/Guardian was advised Release of Information must be obtained prior to any record release in order to collaborate their care with an outside provider. Patient/Guardian was advised if they have not already done so to contact the registration department to sign all necessary forms in order for [place] to release information regarding their care.   Consent:  Patient/Guardian gives verbal consent for treatment and assignment of benefits for services provided during this visit. Patient/Guardian expressed understanding and agreed to proceed.  Ernest Haber Kaya Pottenger, LCSW 10/15/2021

## 2021-10-16 NOTE — Plan of Care (Signed)
  Problem: Reduce the negative impact trauma related symptoms have on social, occupational, and family functioning per pt self report 3 out of 5 sessions documented. PTSD-Trauma Disorder CCP Problem  1  Goal: LTG: Reduce frequency, intensity, and duration of PTSD symptoms so daily functioning is improved: Input needed on appropriate metric.  per pt self report Outcome: Progressing Goal: LTG: Elimination of maladaptive behaviors and thinking patterns which interfere with resolution of trauma: per pt self report Outcome: Progressing Intervention: Assist with relaxation techniques, as appropriate (deep breathing exercises, meditation, guided imagery) Note: reviewed Intervention: Encourage patient to identify triggers Note: Assisted pt with identifying triggers and managing symptoms Intervention: Encourage verbalization of feelings/concerns/expectations Note: Encouraged Intervention: Encourage self-care activities Note: Reviewed/encouraged

## 2021-10-22 ENCOUNTER — Other Ambulatory Visit (HOSPITAL_COMMUNITY): Payer: Self-pay

## 2021-10-25 ENCOUNTER — Other Ambulatory Visit: Payer: Self-pay | Admitting: Cardiovascular Disease

## 2021-10-26 ENCOUNTER — Other Ambulatory Visit: Payer: Self-pay | Admitting: Cardiovascular Disease

## 2021-10-28 ENCOUNTER — Telehealth: Payer: Self-pay | Admitting: Internal Medicine

## 2021-10-28 NOTE — Telephone Encounter (Signed)
Hi,  Would you like for this patient to have refills? She has a history of cancelling appointments and not showing. She is a patient of Dr. Mariah Milling and has an appointment scheduled with [facility] 11/21/2021.  Thank you so much, Jeannetta Cerutti.

## 2021-10-28 NOTE — Telephone Encounter (Signed)
Copied from CRM [id number]. Topic: Medicare AWV >> Oct 28, 2021 10:23 AM Payton Doughty wrote: Reason for CRM: Left message for patient to schedule Annual Wellness Visit.  Please schedule with Nurse Health Advisor Denisa O'Brien-Blaney, LPN at [facility]. This appt can be telephone or office visit.  Please call [phone] ask for [facility]

## 2021-10-31 ENCOUNTER — Encounter: Payer: Self-pay | Admitting: Orthopedic Surgery

## 2021-10-31 NOTE — Telephone Encounter (Signed)
This is not in correct chart/

## 2021-10-31 NOTE — Telephone Encounter (Addendum)
Not Correct chart

## 2021-11-05 ENCOUNTER — Other Ambulatory Visit: Payer: Self-pay | Admitting: Cardiovascular Disease

## 2021-11-05 DIAGNOSIS — S82142A Displaced bicondylar fracture of left tibia, initial encounter for closed fracture: Secondary | ICD-10-CM | POA: Diagnosis not present

## 2021-11-06 ENCOUNTER — Telehealth: Payer: Self-pay | Admitting: Cardiology

## 2021-11-06 DIAGNOSIS — M791 Myalgia, unspecified site: Secondary | ICD-10-CM | POA: Diagnosis not present

## 2021-11-06 DIAGNOSIS — G894 Chronic pain syndrome: Secondary | ICD-10-CM | POA: Diagnosis not present

## 2021-11-06 DIAGNOSIS — Z79891 Long term (current) use of opiate analgesic: Secondary | ICD-10-CM | POA: Diagnosis not present

## 2021-11-06 DIAGNOSIS — M5440 Lumbago with sciatica, unspecified side: Secondary | ICD-10-CM | POA: Diagnosis not present

## 2021-11-06 DIAGNOSIS — M542 Cervicalgia: Secondary | ICD-10-CM | POA: Diagnosis not present

## 2021-11-06 DIAGNOSIS — I10 Essential (primary) hypertension: Secondary | ICD-10-CM | POA: Diagnosis not present

## 2021-11-06 DIAGNOSIS — G8929 Other chronic pain: Secondary | ICD-10-CM | POA: Diagnosis not present

## 2021-11-06 DIAGNOSIS — G8921 Chronic pain due to trauma: Secondary | ICD-10-CM | POA: Diagnosis not present

## 2021-11-06 NOTE — Telephone Encounter (Signed)
New Message:    Patient said her pain management doctor ( Dr Park Meo to be sure that Dr Lalla Brothers knew that patient is on Methadone. She says she have been on it for about 18 years.   Pt c/o medication issue:  1. Name of Medication: Methadone  2. How are you currently taking this medication (dosage and times per day)?   Pt was taking 20 mg 3 times a day- but now, trying to do 10 mg 3  times a day  3. Are you having a reaction (difficulty breathing--STAT)?   4. What is your medication issue? Her pain management doctor wanted to be sure that Dr Lalla Brothers knew she was on this mediciine

## 2021-11-07 ENCOUNTER — Other Ambulatory Visit: Payer: Self-pay | Admitting: Cardiovascular Disease

## 2021-11-07 ENCOUNTER — Ambulatory Visit (INDEPENDENT_AMBULATORY_CARE_PROVIDER_SITE_OTHER): Payer: Medicare Other

## 2021-11-07 DIAGNOSIS — I422 Other hypertrophic cardiomyopathy: Secondary | ICD-10-CM

## 2021-11-07 LAB — ECHOCARDIOGRAM COMPLETE
AR max vel: 2.21 cm2
AV Area VTI: 2.42 cm2
AV Area mean vel: 2.39 cm2
AV Mean grad: 3 mmHg
AV Peak grad: 6.6 mmHg
Ao pk vel: 1.28 m/s
Area-P 1/2: 5.06 cm2
S' Lateral: 2.8 cm

## 2021-11-07 MED ORDER — PERFLUTREN LIPID MICROSPHERE
1.0000 mL | INTRAVENOUS | Status: AC | PRN
  Administered 2021-11-07: 2 mL via INTRAVENOUS

## 2021-11-16 ENCOUNTER — Encounter: Payer: Self-pay | Admitting: Cardiology

## 2021-11-19 ENCOUNTER — Ambulatory Visit: Payer: Medicare Other | Admitting: Internal Medicine

## 2021-11-20 ENCOUNTER — Ambulatory Visit: Payer: Medicare Other | Admitting: Psychiatry

## 2021-11-21 ENCOUNTER — Ambulatory Visit: Payer: Medicare Other | Admitting: Medical

## 2021-11-21 NOTE — Progress Notes (Deleted)
Cardiology Office Note:    Date:  11/21/2021   ID:  Cheyenne Gray, DOB Apr 11, 1965, MRN [id number]  PCP:  McLean-Scocuzza, Nino Glow, MD  [facility] Cardiologist:  Ida Rogue, MD  [facility] Electrophysiologist:  Vickie Epley, MD   Referring MD: McLean-Scocuzza, Olivia Mackie *   Chief Complaint: Follow-up  History of Present Illness:    Cheyenne Gray is a 56 y.o. female with a hx of hypertrophic cardiomyopathy, vasovagal syndrome, palpitations, PVCs, syncope nonobstructive CAD by cath in 2018, obesity s/p bariatric surgery who presents for follow-up.   Previous event monitoring 2015 showed NSR with rare PVCs.  12/2014 echo showed normal LVEF.  01/2017 Lexiscan Myoview was ruled low risk with small to moderate sized region of predominantly fixed infarct in the mid to distal anterior septal, anteroseptal apical wall with mild peri-infarct ischemia.  07/2017 echo showed normal EF.  She was started on Lasix for lower extremity swelling and weight gain with subsequent 15 pound weight loss and improvement in lower extremity edema  She called the office 10/31/2019. At that time, she reported a recent episode of syncope while in [place]. Cardiac monitoring was recommended.Cardiac monitoring showed predominantly NSR.  She had 3 runs of VT, the longest lasting 10 beats.  She had 4 runs of SVT.  The longest episode lasted 18 beats.  Isolated PACs/PVCs were also noted.  PVCs were frequent at 13.7% and numbered [id number].  Triggered events corresponded with her PVCs.  She presented to the [facility] emergency department 01/30/20 for chest pain.  High-sensitivity troponin negative. CTA was negative for PE.  Moderate to severe coronary artery calcifications were noted.  She also had a fluid-filled esophagus with recommendation to correlate for symptoms of dysphagia.   She was started on Bentyl 20 mg every 6 hours as needed before meals.  She was also prescribed Pepcid 20 mg twice daily for 1 month.  She was  provided with a referral to [facility].  It was suspected that her chest pain was likely 2/2 esophageal spasm/stricture.  She was referred to EP for hypertrophic CM and PVCs. A cardiac MRI Was ordered which showed normal LVEF and low burden of LGE on MRI. When to use the American Heart Association HCM sudden cardiac death calculator, her risk of sudden cardiac death at 5 years is approximately 4%.  Based on her risk factors she has a class IIb indication for ICD.  After a long discussion the plan is for watchful waiting.   Last seen 05/2021 and was under a lot of stress and had weight gain. Echo 10/2021 showed LVEF 55-60%, no WMA, moderate LVH, no LVOT gradient, G2DD, mild MR. Heart monitor showed rare Supraventricular ectopy and frequent PVCs 5.8%, no sustained arrhythmias.   Today,   Past Medical History:  Diagnosis Date   (HFpEF) heart failure with preserved ejection fraction ([place])    a. Echo 2014: EF 65-70%, nl WM, mildly dilated LA, PASP nl; b. 12/2014 Echo: EF 65-70%, no rwma, mod septal hypertrophy w/o LVOT gradient or SAM; c. 07/2017 Echo: EF 55-60%, no rwma, mildly dil RV w/ nl syst fxn. Mildly dil RA. Dilated IVC w/ elevated CVP. Triv post effusion.   Anxiety    Asthma    Chronic pain    on methadone, managed by Dr. Primus Bravo   Concussion    hx of 4   Coronary artery disease, non-occlusive    a. LHC 1/18: proximal to mid LAD 40% stenosed, mid LAD 30% stenosed, mid RCA  20% stenosed, distal RCA 20% stenosed, EF 55-65%, LVEDP normal   Depression    DJD (degenerative joint disease), multiple sites    History of shingles    Hypertension    Iron deficiency anemia    Long QT interval    Obesity    Palpitations    a. 24 hour Holter: NSR, sinus brady down to 48, occasional PVCs & couplets, 8 beats NSVT; b. 30 day event monitor 2015: NSR with rare PVC.   Psoriasis    Syncope and collapse    Vitamin D deficiency    Wears dentures    full upper and lower    Past Surgical History:  Procedure  Laterality Date   ABDOMINOPLASTY     tummy tuck ? year    BARIATRIC SURGERY  2001   CARDIAC CATHETERIZATION Left 04/16/2016   Procedure: Left Heart Cath and Coronary Angiography;  Surgeon: [name], MD;  Location: [place] [facility];  Service: Cardiovascular;  Laterality: Left;   CHOLECYSTECTOMY  2001   COLONOSCOPY WITH PROPOFOL N/A 09/28/2018   Procedure: COLONOSCOPY WITH PROPOFOL;  Surgeon: Virgel Manifold, MD;  Location: [place];  Service: Endoscopy;  Laterality: N/A;   ESOPHAGOGASTRODUODENOSCOPY (EGD) WITH PROPOFOL N/A 09/28/2018   Procedure: ESOPHAGOGASTRODUODENOSCOPY (EGD) WITH BIOPSY;  Surgeon: Virgel Manifold, MD;  Location: [place];  Service: Endoscopy;  Laterality: N/A;   EXTERNAL FIXATION LEG Left 10/29/2020   Procedure: EXTERNAL FIXATION LEFT KNEE;  Surgeon: Altamese Plummer, MD;  Location: [place];  Service: Orthopedics;  Laterality: Left;   GALLBLADDER SURGERY     GASTRIC BYPASS  2001   GASTROPLASTY      Current Medications: No outpatient medications have been marked as taking for the 11/21/21 encounter (Appointment) with Kathlen Mody, Halil Rentz H, PA-C.   Current Facility-Administered Medications for the 11/21/21 encounter (Appointment) with Kathlen Mody, Stanley Lyness H, PA-C  Medication   cyanocobalamin ((VITAMIN B-12)) injection 1,000 mcg     Allergies:   Covid-19 (mrna) vaccine (pfizer) [covid-19 (mrna) vaccine], Penicillins, and Bee venom   Social History   Socioeconomic History   Marital status: Widowed    Spouse name: Not on file   Number of children: 1   Years of education: assoc degree   Highest education level: Associate degree: occupational, Hotel manager, or vocational program  Occupational History   Not on file  Tobacco Use   Smoking status: Never   Smokeless tobacco: Never  Vaping Use   Vaping Use: Never used  Substance and Sexual Activity   Alcohol use: No    Alcohol/week: 0.0 standard drinks of alcohol    Comment: holidays   Drug  use: No   Sexual activity: Not Currently  Other Topics Concern   Not on file  Social History Narrative   Adopted daughter [name] [phone] (now lives in [place] going to [place] has apt there)   Significant other mac [phone], former husband died    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    Never smoker    No guns   Wears seat belt    No caffeine   Social Determinants of Health   Financial Resource Strain: High Risk (11/06/2017)   Overall Financial Resource Strain (CARDIA)    Difficulty of Paying Living Expenses: Very hard  Food Insecurity: No Food Insecurity (11/06/2017)   Hunger Vital Sign    Worried About Running Out of Food in the Last Year: Never true    Kathryn in the Last  Year: Never true  Transportation Needs: No Transportation Needs (11/06/2017)   PRAPARE - Hydrologist (Medical): No    Lack of Transportation (Non-Medical): No  Physical Activity: Inactive (11/06/2017)   Exercise Vital Sign    Days of Exercise per Week: 0 days    Minutes of Exercise per Session: 0 min  Stress: Stress Concern Present (11/06/2017)   [place]    Feeling of Stress : Very much  Social Connections: Socially Isolated (11/06/2017)   Social Connection and Isolation Panel [NHANES]    Frequency of Communication with Friends and Family: Once a week    Frequency of Social Gatherings with Friends and Family: Never    Attends Religious Services: Never    Marine scientist or Organizations: No    Attends Archivist Meetings: Never    Marital Status: Widowed     Family History: The patient's family history includes Arthritis in her brother; Breast cancer in her maternal aunt; Cancer in her maternal aunt, maternal aunt, maternal grandmother, maternal uncle, maternal uncle, mother, and paternal grandmother; Depression in her brother; Diabetes in her brother and paternal grandmother; Early death in  her father and mother; Heart attack in her brother, maternal grandmother, and mother; Heart attack (age of onset: 27) in her father; Heart disease in her brother, father, and maternal grandmother; Lung cancer in her maternal grandmother; Pancreatic cancer in her maternal grandmother.  ROS:   Please see the history of present illness.     All other systems reviewed and are negative.  EKGs/Labs/Other Studies Reviewed:    The following studies were reviewed today:  Echo 10/2021  1. Left ventricular ejection fraction, by estimation, is 55 to 60%. The  left ventricle has normal function. The left ventricle has no regional  wall motion abnormalities. There is moderate left ventricular hypertrophy.  No significant LVOT gradient  measured. Left ventricular diastolic parameters are consistent with Grade  II diastolic dysfunction (pseudonormalization).   2. Right ventricular systolic function is normal. The right ventricular  size is normal.   3. Left atrial size was moderately dilated.   4. The mitral valve is normal in structure. Mild mitral valve  regurgitation. No evidence of mitral stenosis.   5. The aortic valve is normal in structure. Aortic valve regurgitation is  not visualized. Aortic valve sclerosis/calcification is present, without  any evidence of aortic stenosis.   6. The inferior vena cava is normal in size with greater than 50%  respiratory variability, suggesting right atrial pressure of 3 mmHg.  Heart monitor 06/2021 HR 42 - 136 bpm, average 60 bpm. Rare supraventricular ectopy, <1%. Frequent ventricular ectopy, 5.8%. No sustained arrhythmias.   Lysbeth Galas T. Quentin Ore, MD, [facility] Cardiac Electrophysiology  EKG:  EKG is *** ordered today.  The ekg ordered today demonstrates ***  Recent Labs: 03/01/2021: ALT 5; Hemoglobin 12.6; Platelets 265 04/18/2021: TSH 3.19  Recent Lipid Panel    Component Value Date/Time   CHOL 122 03/01/2021 1520   CHOL 114 10/02/2016 1037    CHOL 147 05/04/2013 1556   TRIG 86 03/01/2021 1520   TRIG 79 05/04/2013 1556   HDL 43 (L) 03/01/2021 1520   HDL 55 10/02/2016 1037   HDL 58 05/04/2013 1556   CHOLHDL 2.8 03/01/2021 1520   VLDL 13 12/07/2019 1106   VLDL 16 05/04/2013 1556   LDLCALC 62 03/01/2021 1520   LDLCALC 73 05/04/2013 1556  Risk Assessment/Calculations:   {Does this patient have ATRIAL FIBRILLATION?:[id number]}   Physical Exam:    VS:  LMP 02/12/2016 (Approximate)     Wt Readings from Last 3 Encounters:  05/29/21 264 lb 6 oz (119.9 kg)  03/01/21 258 lb 12.8 oz (117.4 kg)  11/04/20 249 lb 1.9 oz (113 kg)     GEN: *** Well nourished, well developed in no acute distress HEENT: Normal NECK: No JVD; No carotid bruits LYMPHATICS: No lymphadenopathy CARDIAC: ***RRR, no murmurs, rubs, gallops RESPIRATORY:  Clear to auscultation without rales, wheezing or rhonchi  ABDOMEN: Soft, non-tender, non-distended MUSCULOSKELETAL:  No edema; No deformity  SKIN: Warm and dry NEUROLOGIC:  Alert and oriented x 3 PSYCHIATRIC:  Normal affect   ASSESSMENT:    No diagnosis found. PLAN:    In order of problems listed above:  Hypertrophic CM  Obesity  Disposition: Follow up {follow up:15908} with ***   Shared Decision Making/Informed Consent   {Are you ordering a CV Procedure (e.g. stress test, cath, DCCV, TEE, etc)?   Press F2        :[id number]}    Signed, Alayne Estrella Ninfa Meeker, PA-C  11/21/2021 10:28 AM    [facility]

## 2021-11-26 ENCOUNTER — Ambulatory Visit (INDEPENDENT_AMBULATORY_CARE_PROVIDER_SITE_OTHER): Payer: Medicare Other

## 2021-11-26 ENCOUNTER — Other Ambulatory Visit: Payer: Self-pay

## 2021-11-26 ENCOUNTER — Encounter: Payer: Self-pay | Admitting: Internal Medicine

## 2021-11-26 ENCOUNTER — Ambulatory Visit (INDEPENDENT_AMBULATORY_CARE_PROVIDER_SITE_OTHER): Payer: Medicare Other | Admitting: Internal Medicine

## 2021-11-26 ENCOUNTER — Other Ambulatory Visit: Payer: Self-pay | Admitting: Cardiovascular Disease

## 2021-11-26 VITALS — BP 128/70 | HR 61 | Temp 98.0°F | Ht 64.0 in | Wt 282.2 lb

## 2021-11-26 DIAGNOSIS — I421 Obstructive hypertrophic cardiomyopathy: Secondary | ICD-10-CM | POA: Diagnosis not present

## 2021-11-26 DIAGNOSIS — K469 Unspecified abdominal hernia without obstruction or gangrene: Secondary | ICD-10-CM

## 2021-11-26 DIAGNOSIS — T148XXA Other injury of unspecified body region, initial encounter: Secondary | ICD-10-CM

## 2021-11-26 DIAGNOSIS — G894 Chronic pain syndrome: Secondary | ICD-10-CM

## 2021-11-26 DIAGNOSIS — M79642 Pain in left hand: Secondary | ICD-10-CM | POA: Diagnosis not present

## 2021-11-26 DIAGNOSIS — Z6841 Body Mass Index (BMI) 40.0 and over, adult: Secondary | ICD-10-CM

## 2021-11-26 DIAGNOSIS — I10 Essential (primary) hypertension: Secondary | ICD-10-CM

## 2021-11-26 DIAGNOSIS — R1084 Generalized abdominal pain: Secondary | ICD-10-CM | POA: Diagnosis not present

## 2021-11-26 DIAGNOSIS — R946 Abnormal results of thyroid function studies: Secondary | ICD-10-CM | POA: Diagnosis not present

## 2021-11-26 DIAGNOSIS — E782 Mixed hyperlipidemia: Secondary | ICD-10-CM

## 2021-11-26 DIAGNOSIS — E559 Vitamin D deficiency, unspecified: Secondary | ICD-10-CM | POA: Diagnosis not present

## 2021-11-26 DIAGNOSIS — L304 Erythema intertrigo: Secondary | ICD-10-CM

## 2021-11-26 DIAGNOSIS — R55 Syncope and collapse: Secondary | ICD-10-CM

## 2021-11-26 DIAGNOSIS — E538 Deficiency of other specified B group vitamins: Secondary | ICD-10-CM

## 2021-11-26 DIAGNOSIS — Z1389 Encounter for screening for other disorder: Secondary | ICD-10-CM | POA: Diagnosis not present

## 2021-11-26 DIAGNOSIS — Z8669 Personal history of other diseases of the nervous system and sense organs: Secondary | ICD-10-CM

## 2021-11-26 DIAGNOSIS — I2510 Atherosclerotic heart disease of native coronary artery without angina pectoris: Secondary | ICD-10-CM

## 2021-11-26 LAB — CBC WITH DIFFERENTIAL/PLATELET
Basophils Absolute: 0 10*3/uL (ref 0.0–0.1)
Basophils Relative: 0.7 % (ref 0.0–3.0)
Eosinophils Absolute: 0.1 10*3/uL (ref 0.0–0.7)
Eosinophils Relative: 2.1 % (ref 0.0–5.0)
HCT: 37.3 % (ref 36.0–46.0)
Hemoglobin: 12.4 g/dL (ref 12.0–15.0)
Lymphocytes Relative: 27.7 % (ref 12.0–46.0)
Lymphs Abs: 1.9 10*3/uL (ref 0.7–4.0)
MCHC: 33.3 g/dL (ref 30.0–36.0)
MCV: 88 fl (ref 78.0–100.0)
Monocytes Absolute: 0.6 10*3/uL (ref 0.1–1.0)
Monocytes Relative: 8.4 % (ref 3.0–12.0)
Neutro Abs: 4.2 10*3/uL (ref 1.4–7.7)
Neutrophils Relative %: 61.1 % (ref 43.0–77.0)
Platelets: 229 10*3/uL (ref 150.0–400.0)
RBC: 4.24 Mil/uL (ref 3.87–5.11)
RDW: 14.7 % (ref 11.5–15.5)
WBC: 6.9 10*3/uL (ref 4.0–10.5)

## 2021-11-26 LAB — COMPREHENSIVE METABOLIC PANEL
ALT: 17 U/L (ref 0–35)
AST: 27 U/L (ref 0–37)
Albumin: 3.7 g/dL (ref 3.5–5.2)
Alkaline Phosphatase: 155 U/L — ABNORMAL HIGH (ref 39–117)
BUN: 9 mg/dL (ref 6–23)
CO2: 31 mEq/L (ref 19–32)
Calcium: 9.1 mg/dL (ref 8.4–10.5)
Chloride: 102 mEq/L (ref 96–112)
Creatinine, Ser: 0.89 mg/dL (ref 0.40–1.20)
GFR: 73.59 mL/min (ref >60.00)
Glucose, Bld: 77 mg/dL (ref 70–99)
Potassium: 4 mEq/L (ref 3.5–5.1)
Sodium: 141 mEq/L (ref 135–145)
Total Bilirubin: 0.5 mg/dL (ref 0.2–1.2)
Total Protein: 6.9 g/dL (ref 6.0–8.3)

## 2021-11-26 LAB — TSH: TSH: 4.66 u[IU]/mL (ref 0.35–5.50)

## 2021-11-26 LAB — VITAMIN D 25 HYDROXY (VIT D DEFICIENCY, FRACTURES): VITD: 39.08 ng/mL (ref 30.00–100.00)

## 2021-11-26 LAB — LIPID PANEL
Cholesterol: 183 mg/dL (ref 0–200)
HDL: 49 mg/dL (ref >39.00)
LDL Cholesterol: 112 mg/dL — ABNORMAL HIGH (ref 0–99)
NonHDL: 133.96
Total CHOL/HDL Ratio: 4
Triglycerides: 109 mg/dL (ref 0.0–149.0)
VLDL: 21.8 mg/dL (ref 0.0–40.0)

## 2021-11-26 MED ORDER — WEGOVY 2.4 MG/0.75ML ~~LOC~~ SOAJ: 2.4000 mg | SUBCUTANEOUS | 5 refills | Status: DC

## 2021-11-26 MED ORDER — HYDROCORTISONE 2.5 % EX CREA: TOPICAL_CREAM | Freq: Two times a day (BID) | CUTANEOUS | 0 refills | Status: DC

## 2021-11-26 MED ORDER — METHADONE HCL 10 MG PO TABS: ORAL_TABLET | ORAL | 0 refills | Status: DC

## 2021-11-26 MED ORDER — CLOTRIMAZOLE 1 % EX CREA: 1.0000 | TOPICAL_CREAM | Freq: Two times a day (BID) | CUTANEOUS | 2 refills | Status: DC

## 2021-11-26 MED ORDER — WEGOVY 1.7 MG/0.75ML ~~LOC~~ SOAJ: 1.7000 mg | SUBCUTANEOUS | 0 refills | Status: DC

## 2021-11-26 MED ORDER — WEGOVY 0.25 MG/0.5ML ~~LOC~~ SOAJ: 0.2500 mg | SUBCUTANEOUS | 0 refills | Status: DC

## 2021-11-26 MED ORDER — CARVEDILOL 6.25 MG PO TABS: 6.2500 mg | ORAL_TABLET | Freq: Two times a day (BID) | ORAL | 3 refills | Status: DC

## 2021-11-26 MED ORDER — FLUCONAZOLE 150 MG PO TABS: 150.0000 mg | ORAL_TABLET | ORAL | 0 refills | Status: DC

## 2021-11-26 MED ORDER — WEGOVY 0.5 MG/0.5ML ~~LOC~~ SOAJ: 0.5000 mg | SUBCUTANEOUS | 0 refills | Status: DC

## 2021-11-26 MED ORDER — GABAPENTIN 400 MG PO CAPS: ORAL_CAPSULE | ORAL | 3 refills | Status: DC

## 2021-11-26 MED ORDER — WEGOVY 1 MG/0.5ML ~~LOC~~ SOAJ: 1.0000 mg | SUBCUTANEOUS | 0 refills | Status: DC

## 2021-11-26 NOTE — Patient Instructions (Addendum)
Ambereen or estrovan  Consider Tdap vaccine   Dr. Lucia Gaskins neurology  MD Physician   Primary Contact Information  Phone Fax E-mail Address  [phone] Not available [address]   [place] [address]     Specialties     Neurology              [facility] clinic surgery  Phone Fax E-mail Address  [phone] Not available [address]   [place] [address]      Specialties     General Surgery, Radiology        Plastics Dr. Arita Miss in [place] Intertrigo Intertrigo is skin irritation or inflammation (dermatitis) that occurs when folds of skin rub together. The irritation can cause a rash and make skin raw and itchy. This condition most commonly occurs in the skin folds of these areas: Toes. Armpits. Groin. Under the belly. Under the breasts. Buttocks. Intertrigo is not passed from person to person (is not contagious). What are the causes? This condition is caused by heat, moisture, rubbing (friction), and not enough air circulation. The condition can be made worse by: Sweat. Bacteria. A fungus, such as yeast. What increases the risk? This condition is more likely to occur if you have moisture in your skin folds. You are more likely to develop this condition if you: Have diabetes. Are overweight. Are not able to move around or are not active. Live in a warm and moist climate. Wear splints, braces, or other medical devices. Are not able to control your bowels or bladder (have incontinence). What are the signs or symptoms? Symptoms of this condition include: A pink or red skin rash in the skin fold or near the skin fold. Raw or scaly skin. Itchiness. A burning feeling. Bleeding. Leaking fluid. A bad smell. How is this diagnosed? This condition is diagnosed with a medical history and physical exam. You may also have a skin swab to test for bacteria or a fungus. How is this treated? This condition may be treated by: Cleaning  and drying your skin. Taking an antibiotic medicine or using an antibiotic skin cream for a bacterial infection. Using an antifungal cream on your skin or taking pills for an infection that was caused by a fungus, such as yeast. Using a steroid ointment to relieve itchiness and irritation. Separating the skin fold with a clean cotton cloth to absorb moisture and allow air to flow into the area. Follow these instructions at home: Keep the affected area clean and dry. Do not scratch your skin. Stay in a cool environment as much as possible. Use an air conditioner or fan, if available. Apply over-the-counter and prescription medicines only as told by your health care provider. If you were prescribed an antibiotic medicine, use it as told by your health care provider. Do not stop using the antibiotic even if your condition improves. Keep all follow-up visits as told by your health care provider. This is important. How is this prevented?  Maintain a healthy weight. Take care of your feet, especially if you have diabetes. Foot care includes: Wearing shoes that fit well. Keeping your feet dry. Wearing clean, breathable socks. Protect the skin around your groin and buttocks, especially if you have incontinence. Skin protection includes: Following a regular cleaning routine. Using skin protectant creams, powders, or ointments. Changing protection pads frequently. Do not wear tight clothes. Wear clothes that are loose, absorbent, and made of cotton. Wear a bra that gives good support,  if needed. Shower and dry yourself well after activity or exercise. Use a hair dryer on a cool setting to dry between skin folds, especially after you bathe. If you have diabetes, keep your blood sugar under control. Contact a health care provider if: Your symptoms do not improve with treatment. Your symptoms get worse or they spread. You notice increased redness and warmth. You have a fever. Summary Intertrigo  is skin irritation or inflammation (dermatitis) that occurs when folds of skin rub together. This condition is caused by heat, moisture, rubbing (friction), and not enough air circulation. This condition may be treated by cleaning and drying your skin and with medicines. Apply over-the-counter and prescription medicines only as told by your health care provider. Keep all follow-up visits as told by your health care provider. This is important. This information is not intended to replace advice given to you by your health care provider. Make sure you discuss any questions you have with your health care provider. Document Revised: 05/15/2021 Document Reviewed: 12/17/2020 Elsevier Patient Education  2023 Elsevier Inc.  Menopause Menopause is the normal time of a woman's life when menstrual periods stop completely. It marks the natural end to a woman's ability to become pregnant. It can be defined as the absence of a menstrual period for 12 months without another medical cause. The transition to menopause (perimenopause) most often happens between the ages of 75 and 86, and can last for many years. During perimenopause, hormone levels change in your body, which can cause symptoms and affect your health. Menopause may increase your risk for: Weakened bones (osteoporosis), which causes fractures. Depression. Hardening and narrowing of the arteries (atherosclerosis), which can cause heart attacks and strokes. What are the causes? This condition is usually caused by a natural change in hormone levels that happens as you get older. The condition may also be caused by changes that are not natural, including: Surgery to remove both ovaries (surgical menopause). Side effects from some medicines, such as chemotherapy used to treat cancer (chemical menopause). What increases the risk? This condition is more likely to start at an earlier age if you have certain medical conditions or have undergone treatments,  including: A tumor of the pituitary gland in the brain. A disease that affects the ovaries and hormones. Certain cancer treatments, such as chemotherapy or hormone therapy, or radiation therapy on the pelvis. Heavy smoking and excessive alcohol use. Family history of early menopause. This condition is also more likely to develop earlier in women who are very thin. What are the signs or symptoms? Symptoms of this condition include: Hot flashes. Irregular menstrual periods. Night sweats. Changes in feelings about sex. This could be a decrease in sex drive or an increased discomfort around your sexuality. Vaginal dryness and thinning of the vaginal walls. This may cause painful sex. Dryness of the skin and development of wrinkles. Headaches. Problems sleeping (insomnia). Mood swings or irritability. Memory problems. Weight gain. Hair growth on the face and chest. Bladder infections or problems with urinating. How is this diagnosed? This condition is diagnosed based on your medical history, a physical exam, your age, your menstrual history, and your symptoms. Hormone tests may also be done. How is this treated? In some cases, no treatment is needed. You and your health care provider should make a decision together about whether treatment is necessary. Treatment will be based on your individual condition and preferences. Treatment for this condition focuses on managing symptoms. Treatment may include: Menopausal hormone therapy (MHT). Medicines  to treat specific symptoms or complications. Acupuncture. Vitamin or herbal supplements. Before starting treatment, make sure to let your health care provider know if you have a personal or family history of these conditions: Heart disease. Breast cancer. Blood clots. Diabetes. Osteoporosis. Follow these instructions at home: Lifestyle Do not use any products that contain nicotine or tobacco, such as cigarettes, e-cigarettes, and chewing  tobacco. If you need help quitting, ask your health care provider. Get at least 30 minutes of physical activity on 5 or more days each week. Avoid alcoholic and caffeinated beverages, as well as spicy foods. This may help prevent hot flashes. Get 7-8 hours of sleep each night. If you have hot flashes, try: Dressing in layers. Avoiding things that may trigger hot flashes, such as spicy food, warm places, or stress. Taking slow, deep breaths when a hot flash starts. Keeping a fan in your home and office. Find ways to manage stress, such as deep breathing, meditation, or journaling. Consider going to group therapy with other women who are having menopause symptoms. Ask your health care provider about recommended group therapy meetings. Eating and drinking  Eat a healthy, balanced diet that contains whole grains, lean protein, low-fat dairy, and plenty of fruits and vegetables. Your health care provider may recommend adding more soy to your diet. Foods that contain soy include tofu, tempeh, and soy milk. Eat plenty of foods that contain calcium and vitamin D for bone health. Items that are rich in calcium include low-fat milk, yogurt, beans, almonds, sardines, broccoli, and kale. Medicines Take over-the-counter and prescription medicines only as told by your health care provider. Talk with your health care provider before starting any herbal supplements. If prescribed, take vitamins and supplements as told by your health care provider. General instructions  Keep track of your menstrual periods, including: When they occur. How heavy they are and how long they last. How much time passes between periods. Keep track of your symptoms, noting when they start, how often you have them, and how long they last. Use vaginal lubricants or moisturizers to help with vaginal dryness and improve comfort during sex. Keep all follow-up visits. This is important. This includes any group therapy or  counseling. Contact a health care provider if: You are still having menstrual periods after age 83. You have pain during sex. You have not had a period for 12 months and you develop vaginal bleeding. Get help right away if you have: Severe depression. Excessive vaginal bleeding. Pain when you urinate. A fast or irregular heartbeat (palpitations). Severe headaches. Abdominal pain or severe indigestion. Summary Menopause is a normal time of life when menstrual periods stop completely. It is usually defined as the absence of a menstrual period for 12 months without another medical cause. The transition to menopause (perimenopause) most often happens between the ages of 22 and 32 and can last for several years. Symptoms can be managed through medicines, lifestyle changes, and complementary therapies such as acupuncture. Eat a balanced diet that is rich in nutrients to promote bone health and heart health and to manage symptoms during menopause. This information is not intended to replace advice given to you by your health care provider. Make sure you discuss any questions you have with your health care provider. Document Revised: 12/02/2019 Document Reviewed: 08/18/2019 Elsevier Patient Education  2023 [employer].

## 2021-11-26 NOTE — Progress Notes (Signed)
Vitamin b12 1059mcg/mL was given to pt in left deltoid. Pt tolerated shot well.   --Lindaann Slough, CMA

## 2021-11-26 NOTE — Progress Notes (Addendum)
Chief Complaint  Patient presents with   Follow-up    F/u on menopause    Fu 1. C/o b/l hand pain L> right and knuckles enlarged 2. S/p gastric bypass and panniculectomy she is worried about ab hernia and having ab pain  3. B12 due  4. C/o hot flashes was on effexor 75 mg for mood per Dr. Shea Evans but made mood worse  5. Obesity BMI >48 with htn wants to try wegovy  6. Chronic h/a h/o migraines referred to neurology  7. Wound on abdomen my charted after visit referred to wound clinic   Review of Systems  Constitutional:  Negative for weight loss.  HENT:  Negative for hearing loss.   Eyes:  Negative for blurred vision.  Respiratory:  Negative for shortness of breath.   Cardiovascular:  Negative for chest pain.  Gastrointestinal:  Negative for abdominal pain and blood in stool.  Genitourinary:  Negative for dysuria.  Musculoskeletal:  Positive for joint pain. Negative for falls.  Skin:  Negative for rash.  Neurological:  Negative for headaches.  Psychiatric/Behavioral:  Negative for depression.    Past Medical History:  Diagnosis Date   (HFpEF) heart failure with preserved ejection fraction (Post)    a. Echo 2014: EF 65-70%, nl WM, mildly dilated LA, PASP nl; b. 12/2014 Echo: EF 65-70%, no rwma, mod septal hypertrophy w/o LVOT gradient or SAM; c. 07/2017 Echo: EF 55-60%, no rwma, mildly dil RV w/ nl syst fxn. Mildly dil RA. Dilated IVC w/ elevated CVP. Triv post effusion.   Anxiety    Asthma    Chronic pain    on methadone, managed by Dr. Primus Bravo   Concussion    hx of 4   Coronary artery disease, non-occlusive    a. LHC 1/18: proximal to mid LAD 40% stenosed, mid LAD 30% stenosed, mid RCA 20% stenosed, distal RCA 20% stenosed, EF 55-65%, LVEDP normal   Depression    DJD (degenerative joint disease), multiple sites    History of shingles    HOCM (hypertrophic obstructive cardiomyopathy) (HCC)    Hypertension    Iron deficiency anemia    Long QT interval    Obesity     Palpitations    a. 24 hour Holter: NSR, sinus brady down to 48, occasional PVCs & couplets, 8 beats NSVT; b. 30 day event monitor 2015: NSR with rare PVC.   Psoriasis    Syncope and collapse    Vitamin D deficiency    Wears dentures    full upper and lower   Past Surgical History:  Procedure Laterality Date   ABDOMINOPLASTY     tummy tuck ? year    BARIATRIC SURGERY  2001   CARDIAC CATHETERIZATION Left 04/16/2016   Procedure: Left Heart Cath and Coronary Angiography;  Surgeon: [name], MD;  Location: [facility];  Service: Cardiovascular;  Laterality: Left;   CHOLECYSTECTOMY  2001   COLONOSCOPY WITH PROPOFOL N/A 09/28/2018   Procedure: COLONOSCOPY WITH PROPOFOL;  Surgeon: Virgel Manifold, MD;  Location: [facility];  Service: Endoscopy;  Laterality: N/A;   ESOPHAGOGASTRODUODENOSCOPY (EGD) WITH PROPOFOL N/A 09/28/2018   Procedure: ESOPHAGOGASTRODUODENOSCOPY (EGD) WITH BIOPSY;  Surgeon: Virgel Manifold, MD;  Location: [facility];  Service: Endoscopy;  Laterality: N/A;   EXTERNAL FIXATION LEG Left 10/29/2020   Procedure: EXTERNAL FIXATION LEFT KNEE;  Surgeon: Altamese Kenton, MD;  Location: [facility];  Service: Orthopedics;  Laterality: Left;   GALLBLADDER SURGERY     GASTRIC  BYPASS  2001   GASTROPLASTY     Family History  Problem Relation Age of Onset   Heart attack Mother    Cancer Mother        pancreatitic 45   Early death Mother    Heart attack Father 45       MI   Early death Father    Heart disease Father    Heart attack Brother    Heart disease Brother    Arthritis Brother    Depression Brother    Diabetes Brother    Heart attack Maternal Grandmother    Cancer Maternal Grandmother        pancreatitic    Heart disease Maternal Grandmother    Lung cancer Maternal Grandmother    Pancreatic cancer Maternal Grandmother    Cancer Maternal Uncle        pancreatitic    Cancer Paternal Grandmother        ? type    Diabetes Paternal  Grandmother    Cancer Maternal Uncle        pancreatitic    Breast cancer Maternal Aunt    Cancer Maternal Aunt        GYN   Cancer Maternal Aunt        GYN   Social History   Socioeconomic History   Marital status: Widowed    Spouse name: Not on file   Number of children: 1   Years of education: assoc degree   Highest education level: Associate degree: occupational, Hotel manager, or vocational program  Occupational History   Not on file  Tobacco Use   Smoking status: Never   Smokeless tobacco: Never  Vaping Use   Vaping Use: Never used  Substance and Sexual Activity   Alcohol use: Not Currently    Comment: holidays   Drug use: No   Sexual activity: Not Currently  Other Topics Concern   Not on file  Social History Narrative   Adopted daughter [name] [phone] (now lives in [place] going to [place] has apt there)   Significant other mac [phone], former husband died    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    Never smoker    No guns   Wears seat belt    No caffeine   Social Determinants of Health   Financial Resource Strain: High Risk (11/06/2017)   Overall Financial Resource Strain (CARDIA)    Difficulty of Paying Living Expenses: Very hard  Food Insecurity: No Food Insecurity (11/06/2017)   Hunger Vital Sign    Worried About Running Out of Food in the Last Year: Never true    [place] in the Last Year: Never true  Transportation Needs: No Transportation Needs (11/06/2017)   PRAPARE - Hydrologist (Medical): No    Lack of Transportation (Non-Medical): No  Physical Activity: Inactive (11/06/2017)   Exercise Vital Sign    Days of Exercise per Week: 0 days    Minutes of Exercise per Session: 0 min  Stress: Stress Concern Present (11/06/2017)   [place]    Feeling of Stress : Very much  Social Connections: Socially Isolated (11/06/2017)   Social Connection and Isolation Panel  [NHANES]    Frequency of Communication with Friends and Family: Once a week    Frequency of Social Gatherings with Friends and Family: Never    Attends Religious Services: Never    Active Member  of Clubs or Organizations: No    Attends Archivist Meetings: Never    Marital Status: Widowed  Intimate Partner Violence: Not At Risk (11/06/2017)   Humiliation, Afraid, Rape, and Kick questionnaire    Fear of Current or Ex-Partner: No    Emotionally Abused: No    Physically Abused: No    Sexually Abused: No   Current Meds  Medication Sig   albuterol (PROAIR HFA) 108 (90 Base) MCG/ACT inhaler Inhale 1-2 puffs into the lungs every 6 (six) hours as needed for wheezing or shortness of breath.   Biotin 10 MG CAPS Take 10 mg by mouth daily.   clotrimazole (CLOTRIMAZOLE AF) 1 % cream Apply 1 Application topically 2 (two) times daily. Skin folds   diclofenac Sodium (VOLTAREN) 1 % GEL Apply topically.   fluconazole (DIFLUCAN) 150 MG tablet Take 1 tablet (150 mg total) by mouth once a week. X 2-4 weeks   hydrocortisone 2.5 % cream Apply topically 2 (two) times daily. Skin folds   isosorbide mononitrate (IMDUR) 60 MG 24 hr tablet TAKE ONE TABLET BY MOUTH DAILY AT 9 AM **PLEASE SCHEDULE OFFICE VISIT FOR FURTHER REFILLS** (VIAL)   Magnesium Oxide 400 MG CAPS Take 1 capsule (400 mg total) by mouth daily.   mirtazapine (REMERON) 15 MG tablet Take 1.5 tablets (22.5 mg total) by mouth at bedtime.   nitroGLYCERIN (NITROSTAT) 0.4 MG SL tablet PLACE 1 TABLET UNDER THE TONGUE EVERY 5 MINUTES AS NEEDED FOR CHEST PAIN. AFTER 2ND DOSE CALL 911   rosuvastatin (CRESTOR) 10 MG tablet TAKE ONE TABLET BY MOUTH DAILY AT 5 PM (NEED APPOINTMENT BEFORE NEXT REFILL) (VIAL)   tiZANidine (ZANAFLEX) 4 MG tablet Take 4 mg by mouth 2 (two) times daily.   [DISCONTINUED] carvedilol (COREG) 6.25 MG tablet TAKE 1 TABLET BY MOUTH TWICE A DAY   [DISCONTINUED] gabapentin (NEURONTIN) 400 MG capsule LIMIT 2 TABLETS IN THE A.M. AND  MIDDAY AND 3 TABLETS EACH EVENING   [DISCONTINUED] methadone (DOLOPHINE) 10 MG tablet Limit 1-2 tablets by mouth 2-3 times per day if tolerated   [DISCONTINUED] Semaglutide-Weight Management (WEGOVY) 0.25 MG/0.5ML SOAJ Inject 0.25 mg into the skin once a week. X 66month  [DISCONTINUED] Semaglutide-Weight Management (WEGOVY) 0.5 MG/0.5ML SOAJ Inject 0.5 mg into the skin once a week. X 1 month   [DISCONTINUED] Semaglutide-Weight Management (WEGOVY) 1 MG/0.5ML SOAJ Inject 1 mg into the skin once a week. X 1 month   [DISCONTINUED] Semaglutide-Weight Management (WEGOVY) 1.7 MG/0.75ML SOAJ Inject 1.7 mg into the skin once a week. X 147month [DISCONTINUED] Semaglutide-Weight Management (WEGOVY) 2.4 MG/0.75ML SOAJ Inject 2.4 mg into the skin once a week. X 10m210monthCurrent Facility-Administered Medications for the 11/26/21 encounter (Office Visit) with McLean-Scocuzza, TraNino GlowD  Medication   cyanocobalamin ((VITAMIN B-12)) injection 1,000 mcg   Allergies  Allergen Reactions   Covid-19 (Mrna) Vaccine (PfTherapist, musicCovid-19 (Mrna) Vaccine] Itching, Palpitations and Other (See Comments)    Throat closing.    Penicillins Hives, Shortness Of Breath and Rash    Has patient had a PCN reaction causing immediate rash, facial/tongue/throat swelling, SOB or lightheadedness with hypotension: Yes Has patient had a PCN reaction causing severe rash involving mucus membranes or skin necrosis: No Has patient had a PCN reaction that required hospitalization No Has patient had a PCN reaction occurring within the last 10 years: No If all of the above answers are "NO", then may proceed with Cephalosporin use.    Bee Venom  Effexor Xr [Venlafaxine Hcl]     Mood worse   Recent Results (from the past 2160 hour(s))  ECHOCARDIOGRAM COMPLETE     Status: None   Collection Time: 11/07/21  2:39 PM  Result Value Ref Range   AR max vel 2.21 cm2   AV Peak grad 6.6 mmHg   Ao pk vel 1.28 m/s   S' Lateral 2.80 cm   Area-P  1/2 5.06 cm2   AV Area VTI 2.42 cm2   AV Mean grad 3.0 mmHg   AV Area mean vel 2.39 cm2  Lipid panel     Status: Abnormal   Collection Time: 11/26/21  8:43 AM  Result Value Ref Range   Cholesterol 183 0 - 200 mg/dL    Comment: ATP III Classification       Desirable:  < 200 mg/dL               Borderline High:  200 - 239 mg/dL          High:  > = 240 mg/dL   Triglycerides 109.0 0.0 - 149.0 mg/dL    Comment: Normal:  <150 mg/dLBorderline High:  150 - 199 mg/dL   HDL 49.00 >39.00 mg/dL   VLDL 21.8 0.0 - 40.0 mg/dL   LDL Cholesterol 112 (H) 0 - 99 mg/dL   Total CHOL/HDL Ratio 4     Comment:                Men          Women1/2 Average Risk     3.4          3.3Average Risk          5.0          4.42X Average Risk          9.6          7.13X Average Risk          15.0          11.0                       NonHDL 133.96     Comment: NOTE:  Non-HDL goal should be 30 mg/dL higher than patient's LDL goal (i.e. LDL goal of < 70 mg/dL, would have non-HDL goal of < 100 mg/dL)  CBC with Differential/Platelet     Status: None   Collection Time: 11/26/21  8:43 AM  Result Value Ref Range   WBC 6.9 4.0 - 10.5 K/uL   RBC 4.24 3.87 - 5.11 Mil/uL   Hemoglobin 12.4 12.0 - 15.0 g/dL   HCT 37.3 36.0 - 46.0 %   MCV 88.0 78.0 - 100.0 fl   MCHC 33.3 30.0 - 36.0 g/dL   RDW 14.7 11.5 - 15.5 %   Platelets 229.0 150.0 - 400.0 K/uL   Neutrophils Relative % 61.1 43.0 - 77.0 %   Lymphocytes Relative 27.7 12.0 - 46.0 %   Monocytes Relative 8.4 3.0 - 12.0 %   Eosinophils Relative 2.1 0.0 - 5.0 %   Basophils Relative 0.7 0.0 - 3.0 %   Neutro Abs 4.2 1.4 - 7.7 K/uL   Lymphs Abs 1.9 0.7 - 4.0 K/uL   Monocytes Absolute 0.6 0.1 - 1.0 K/uL   Eosinophils Absolute 0.1 0.0 - 0.7 K/uL   Basophils Absolute 0.0 0.0 - 0.1 K/uL  Comprehensive metabolic panel     Status: Abnormal   Collection Time: 11/26/21  8:43 AM  Result Value Ref Range   Sodium 141 135 - 145 mEq/L   Potassium 4.0 3.5 - 5.1 mEq/L   Chloride 102 96 - 112  mEq/L   CO2 31 19 - 32 mEq/L   Glucose, Bld 77 70 - 99 mg/dL   BUN 9 6 - 23 mg/dL   Creatinine, Ser 0.89 0.40 - 1.20 mg/dL   Total Bilirubin 0.5 0.2 - 1.2 mg/dL   Alkaline Phosphatase 155 (H) 39 - 117 U/L   AST 27 0 - 37 U/L   ALT 17 0 - 35 U/L   Total Protein 6.9 6.0 - 8.3 g/dL   Albumin 3.7 3.5 - 5.2 g/dL   GFR 73.59 >60.00 mL/min    Comment: Calculated using the CKD-EPI Creatinine Equation (2021)   Calcium 9.1 8.4 - 10.5 mg/dL  TSH     Status: None   Collection Time: 11/26/21  8:43 AM  Result Value Ref Range   TSH 4.66 0.35 - 5.50 uIU/mL  Urinalysis, Routine w reflex microscopic     Status: Abnormal   Collection Time: 11/26/21  8:43 AM  Result Value Ref Range   Color, Urine YELLOW YELLOW   APPearance CLEAR CLEAR   Specific Gravity, Urine 1.008 1.001 - 1.035   pH 6.0 5.0 - 8.0   Glucose, UA NEGATIVE NEGATIVE   Bilirubin Urine NEGATIVE NEGATIVE   Ketones, ur NEGATIVE NEGATIVE   Hgb urine dipstick TRACE (A) NEGATIVE   Protein, ur NEGATIVE NEGATIVE   Nitrite NEGATIVE NEGATIVE   Leukocytes,Ua 2+ (A) NEGATIVE   WBC, UA 10-20 (A) 0 - 5 /HPF   RBC / HPF NONE SEEN 0 - 2 /HPF   Squamous Epithelial / LPF 0-5 < OR = 5 /HPF   Bacteria, UA NONE SEEN NONE SEEN /HPF   Hyaline Cast NONE SEEN NONE SEEN /LPF  Vitamin D (25 hydroxy)     Status: None   Collection Time: 11/26/21  8:43 AM  Result Value Ref Range   VITD 39.08 30.00 - 100.00 ng/mL  MICROSCOPIC MESSAGE     Status: None   Collection Time: 11/26/21  8:43 AM  Result Value Ref Range   Note      Comment: This urine was analyzed for the presence of WBC,  RBC, bacteria, casts, and other formed elements.  Only those elements seen were reported. . .    Objective  Body mass index is 48.44 kg/m. Wt Readings from Last 3 Encounters:  11/30/21 275 lb (124.7 kg)  11/26/21 282 lb 3.2 oz (128 kg)  05/29/21 264 lb 6 oz (119.9 kg)   Temp Readings from Last 3 Encounters:  11/30/21 98.3 F (36.8 C) (Oral)  11/26/21 98 F (36.7 C)  (Oral)  03/01/21 (!) 97.4 F (36.3 C) (Temporal)   BP Readings from Last 3 Encounters:  11/30/21 (!) 143/86  11/26/21 128/70  05/29/21 120/80   Pulse Readings from Last 3 Encounters:  11/30/21 60  11/26/21 61  05/29/21 (!) 58    Physical Exam Vitals and nursing note reviewed.  Constitutional:      Appearance: Normal appearance. She is well-developed and well-groomed.  HENT:     Head: Normocephalic and atraumatic.  Eyes:     Conjunctiva/sclera: Conjunctivae normal.     Pupils: Pupils are equal, round, and reactive to light.  Cardiovascular:     Rate and Rhythm: Normal rate and regular rhythm.     Heart sounds: Normal heart sounds. No murmur heard. Pulmonary:     Effort: Pulmonary  effort is normal.     Breath sounds: Normal breath sounds.  Abdominal:     General: Abdomen is flat. Bowel sounds are normal.     Tenderness: There is no abdominal tenderness.  Musculoskeletal:        General: No tenderness.  Skin:    General: Skin is warm and dry.  Neurological:     General: No focal deficit present.     Mental Status: She is alert and oriented to person, place, and time. Mental status is at baseline.     Cranial Nerves: Cranial nerves 2-12 are intact.     Motor: Motor function is intact.     Coordination: Coordination is intact.     Gait: Gait is intact.  Psychiatric:        Attention and Perception: Attention and perception normal.        Mood and Affect: Mood and affect normal.        Speech: Speech normal.        Behavior: Behavior normal. Behavior is cooperative.        Thought Content: Thought content normal.        Cognition and Memory: Cognition and memory normal.        Judgment: Judgment normal.     Assessment  Plan  Left hand pain - Plan: DG Hand Complete Left  Class 3 obesity bmi >48 due to excess calories BMI 45.0-49.9, adult (HCC) - Plan: Semaglutide-Weight Management (WEGOVY) 0.25 MG/0.5ML SOAJ, Semaglutide-Weight Management (WEGOVY) 0.5 MG/0.5ML  SOAJ, Semaglutide-Weight Management (WEGOVY) 1 MG/0.5ML SOAJ, Semaglutide-Weight Management (WEGOVY) 1.7 MG/0.75ML SOAJ, Semaglutide-Weight Management (WEGOVY) 2.4 MG/0.75ML SOAJ  B12 deficiency  Mixed hyperlipidemia - Plan: Semaglutide-Weight Management (WEGOVY) 0.25 MG/0.5ML SOAJ, Semaglutide-Weight Management (WEGOVY) 0.5 MG/0.5ML SOAJ, Semaglutide-Weight Management (WEGOVY) 1 MG/0.5ML SOAJ, Semaglutide-Weight Management (WEGOVY) 1.7 MG/0.75ML SOAJ, Semaglutide-Weight Management (WEGOVY) 2.4 MG/0.75ML SOAJ  Essential hypertension controlled - Plan: Semaglutide-Weight Management (WEGOVY) 0.25 MG/0.5ML SOAJ, Semaglutide-Weight Management (WEGOVY) 0.5 MG/0.5ML SOAJ, Semaglutide-Weight Management (WEGOVY) 1 MG/0.5ML SOAJ, Semaglutide-Weight Management (WEGOVY) 1.7 MG/0.75ML SOAJ, Semaglutide-Weight Management (WEGOVY) 2.4 MG/0.75ML SOAJ, Lipid panel, CBC with Differential/Platelet, Comprehensive metabolic panel  Generalized abdominal pain - Plan: Ambulatory referral to General Surgery  Abdominal hernia without obstruction and without gangrene, recurrence not specified, unspecified hernia type - Plan: Ambulatory referral to General Surgery  Vitamin D deficiency - Plan: Vitamin D (25 hydroxy)  Abnormal thyroid exam - Plan: TSH   History of migraine - Plan: Ambulatory referral to Neurology  HOCM (hypertrophic obstructive cardiomyopathy) ([facility])  Intertrigo - Plan: fluconazole (DIFLUCAN) 150 MG tablet, hydrocortisone 2.5 % cream Wound on abdomen referred to wound clinic rec bactroban and clotrimazole until wound clinic f/u  Syncope and collapse - Plan: carvedilol (COREG) 6.25 MG tablet  Nonobstructive atherosclerosis of coronary artery - Plan: carvedilol (COREG) 6.25 MG tablet   HM Never gets flu shot  Tdap ? Had 2009/2010 likely due for repeat -Rx today  Tdap given to pt to get at pharmacy 03/01/21  covid 1/2 had reaction not getting 2nd dose   consider shingrix in future  rec hep B  vaccine low titer 6 08/18/17  -consider vaccine in the future Hep A immune  B12 Q6 months    Pap pt has not had in a while wants to think about it if needed consider OB/GYN vs PCP pt wants to wait again as of 12/21/18 and has not had a pap in > 19 years per pt h/o trauma and hard to do paps she will think about this  and we will re discuss  Pap declines 11/26/21   Colonoscopy 09/28/18/EGD poor prep   needs repeat colonoscopy done 09/28/18 prep poor and repeat sch 01/2019 Dr. Earley Favor [facility] pt never had  EGD 09/28/18 neg bx for eos esophagitis   due for repeat colonoscopy disc 03/01/21 needs to call back    Mammogram 09/29/17 normal ordered call to schedule   DEXA 09/29/17 normal    Of note home sleep study ordered by lung MD Dr. Juanell Fairly never done from 08/2017   Rec healthy diet and exercise   Provider: Dr. Olivia Mackie McLean-Scocuzza-Internal Medicine

## 2021-11-27 ENCOUNTER — Other Ambulatory Visit: Payer: Self-pay | Admitting: Internal Medicine

## 2021-11-27 ENCOUNTER — Encounter: Payer: Self-pay | Admitting: Internal Medicine

## 2021-11-27 DIAGNOSIS — Z6841 Body Mass Index (BMI) 40.0 and over, adult: Secondary | ICD-10-CM

## 2021-11-27 DIAGNOSIS — R748 Abnormal levels of other serum enzymes: Secondary | ICD-10-CM

## 2021-11-27 DIAGNOSIS — R319 Hematuria, unspecified: Secondary | ICD-10-CM

## 2021-11-27 DIAGNOSIS — I10 Essential (primary) hypertension: Secondary | ICD-10-CM

## 2021-11-27 DIAGNOSIS — E782 Mixed hyperlipidemia: Secondary | ICD-10-CM

## 2021-11-27 LAB — URINALYSIS, ROUTINE W REFLEX MICROSCOPIC
Bacteria, UA: NONE SEEN /HPF
Bilirubin Urine: NEGATIVE
Glucose, UA: NEGATIVE
Hyaline Cast: NONE SEEN /LPF
Ketones, ur: NEGATIVE
Nitrite: NEGATIVE
Protein, ur: NEGATIVE
RBC / HPF: NONE SEEN /HPF (ref 0–2)
Specific Gravity, Urine: 1.008 (ref 1.001–1.035)
pH: 6 (ref 5.0–8.0)

## 2021-11-27 LAB — MICROSCOPIC MESSAGE

## 2021-11-27 MED ORDER — WEGOVY 0.5 MG/0.5ML ~~LOC~~ SOAJ: 0.5000 mg | SUBCUTANEOUS | 0 refills | Status: DC

## 2021-11-27 MED ORDER — WEGOVY 0.25 MG/0.5ML ~~LOC~~ SOAJ: 0.2500 mg | SUBCUTANEOUS | 0 refills | Status: DC

## 2021-11-27 MED ORDER — WEGOVY 1.7 MG/0.75ML ~~LOC~~ SOAJ: 1.7000 mg | SUBCUTANEOUS | 0 refills | Status: DC

## 2021-11-27 MED ORDER — WEGOVY 2.4 MG/0.75ML ~~LOC~~ SOAJ: 2.4000 mg | SUBCUTANEOUS | 5 refills | Status: DC

## 2021-11-27 MED ORDER — WEGOVY 1 MG/0.5ML ~~LOC~~ SOAJ: 1.0000 mg | SUBCUTANEOUS | 0 refills | Status: DC

## 2021-11-28 ENCOUNTER — Encounter: Payer: Self-pay | Admitting: Internal Medicine

## 2021-11-28 ENCOUNTER — Telehealth: Payer: Self-pay | Admitting: Internal Medicine

## 2021-11-28 NOTE — Telephone Encounter (Signed)
Spoke with patient she declined AWV do not call  

## 2021-11-30 ENCOUNTER — Ambulatory Visit
Admission: EM | Admit: 2021-11-30 | Discharge: 2021-11-30 | Disposition: A | Discharge status: Home / Self Care | Payer: Medicare Other | Attending: Physician Assistant | Admitting: Physician Assistant

## 2021-11-30 DIAGNOSIS — S31109A Unspecified open wound of abdominal wall, unspecified quadrant without penetration into peritoneal cavity, initial encounter: Secondary | ICD-10-CM | POA: Diagnosis not present

## 2021-11-30 DIAGNOSIS — L03311 Cellulitis of abdominal wall: Secondary | ICD-10-CM | POA: Diagnosis not present

## 2021-11-30 MED ORDER — DOXYCYCLINE HYCLATE 100 MG PO CAPS: 100.0000 mg | ORAL_CAPSULE | Freq: Two times a day (BID) | ORAL | 0 refills | Status: AC

## 2021-11-30 MED ORDER — MUPIROCIN 2 % EX OINT: 1.0000 | TOPICAL_OINTMENT | Freq: Two times a day (BID) | CUTANEOUS | 0 refills | Status: DC

## 2021-11-30 NOTE — Discharge Instructions (Signed)
-  You have an open wound to your abdomen.  You should clean it every day with soap and water and apply mupirocin ointment twice daily.  Change bandage twice a day. - Start the oral antibiotics and take full course. - Go to the emergency department if you develop fever or have worsening pain, deepening of wound or are not improving in the next few days.  Otherwise, follow-up with PCP and specialist as scheduled.

## 2021-11-30 NOTE — ED Triage Notes (Signed)
Pt c/o lower back/flank rash along the right side. Pt states that it bands around and crosses her abdomen.  Pt states that she had a Tummy Tuck done 12 years ago and the rash is following the line.   Pt states that she has green discharge along the rash.  Pt states that the incision on the Tummy Tuck sometimes opens up and has a open hole that she lets heal.   Pt has a bandage along her right hip that is draining.

## 2021-11-30 NOTE — ED Provider Notes (Signed)
[facility]    CSN: [id number] Arrival date & time: 11/30/21  1109      History   Chief Complaint Chief Complaint  Patient presents with   Abdominal Pain   Rash    HPI Cheyenne Gray is a 56 y.o. female presenting for open wound over right anterolateral trunk/abdomen.  Patient reports that she will occasionally have wounds that pop up along the scar where she had an abdominoplasty performed 12 years ago.  She says they generally heal on their own but she is concerned about potential infection with the one that she noticed 2 days ago.  She says that it has been draining greenish fluid.  She says it is also tender and now her back is aching.  She has been trying to keep it clean and bandaged.  She last changed the bandage yesterday.  She has not had any fevers.  Patient reportedly has an appointment with general surgery to assess her for possible hernia in 2 days.  Her medical history is significant for HOCM, hypertension, obesity, prolonged QT, psoriasis, iron deficiency anemia and chronic pain.  HPI  Past Medical History:  Diagnosis Date   (HFpEF) heart failure with preserved ejection fraction ([place])    a. Echo 2014: EF 65-70%, nl WM, mildly dilated LA, PASP nl; b. 12/2014 Echo: EF 65-70%, no rwma, mod septal hypertrophy w/o LVOT gradient or SAM; c. 07/2017 Echo: EF 55-60%, no rwma, mildly dil RV w/ nl syst fxn. Mildly dil RA. Dilated IVC w/ elevated CVP. Triv post effusion.   Anxiety    Asthma    Chronic pain    on methadone, managed by Dr. Primus Bravo   Concussion    hx of 4   Coronary artery disease, non-occlusive    a. LHC 1/18: proximal to mid LAD 40% stenosed, mid LAD 30% stenosed, mid RCA 20% stenosed, distal RCA 20% stenosed, EF 55-65%, LVEDP normal   Depression    DJD (degenerative joint disease), multiple sites    History of shingles    HOCM (hypertrophic obstructive cardiomyopathy) (HCC)    Hypertension    Iron deficiency anemia    Long QT interval    Obesity     Palpitations    a. 24 hour Holter: NSR, sinus brady down to 48, occasional PVCs & couplets, 8 beats NSVT; b. 30 day event monitor 2015: NSR with rare PVC.   Psoriasis    Syncope and collapse    Vitamin D deficiency    Wears dentures    full upper and lower    Patient Active Problem List   Diagnosis Date Noted   HOCM (hypertrophic obstructive cardiomyopathy) ([place]) 11/26/2021   Chronic back pain 03/01/2021   Adjustment disorder with depressed mood 02/19/2021   Noncompliance with medication regimen 12/13/2020   MDD (major depressive disorder), recurrent, in full remission ([place]) 11/15/2020   Closed fracture of left tibial plateau 10/29/2020   Hypomagnesemia 10/29/2020   Fall 10/29/2020   Insomnia due to medical condition 05/09/2020   FH: pancreatic cancer 12/29/2019   Depression, recurrent ([place]) 12/29/2019   Hand pain, right 11/15/2019   Homeless single person 11/15/2019   Closed fracture of right wrist 11/15/2019   MDD (major depressive disorder), recurrent, in partial remission ([place]) 08/22/2019   Essential hypertension 07/26/2019   Chronic pain of both knees 07/26/2019   Asthma 07/26/2019   Chronic midline low back pain with sciatica 07/26/2019   Lumbar herniated disc 07/26/2019   Migraine without aura and without  status migrainosus, not intractable 07/26/2019   Morbid obesity with BMI of 40.0-44.9, adult (HCC) 07/26/2019   Noncompliance with treatment regimen 05/16/2019   Upper abdominal pain 05/04/2019   Dizziness 05/04/2019   Hand eczema 04/26/2019   At risk for prolonged QT interval syndrome 12/27/2018   Obesity (BMI 30-39.9) 12/21/2018   Excess skin 12/21/2018   Allergy to honey bee venom 12/21/2018   Abnormal CT scan, colon    PTSD (post-traumatic stress disorder) 09/16/2018   GAD (generalized anxiety disorder) 09/16/2018   MDD (major depressive disorder), recurrent episode, moderate (HCC) 09/16/2018   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   Hot flashes due to  menopause 09/09/2018   Memory loss 09/09/2018   Intertrigo 09/09/2018   Fatigue 07/14/2018   Insomnia 07/14/2018   Abnormal CT of the abdomen 07/14/2018   Coronary artery disease of native artery of native heart with stable angina pectoris (HCC) 06/09/2018   Abnormal MRI, lumbar spine 03/30/2018   Abnormal MRI, thoracic spine 03/30/2018   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/30/2018   Hypokalemia 03/30/2018   Edema 10/01/2017   Anxiety and depression 09/28/2017   Vitamin D deficiency 09/28/2017   Chronic heart failure with preserved ejection fraction (HCC) 08/18/2017   Chest pain    Positive cardiac stress test    Spinal stenosis, lumbar region, with neurogenic claudication 10/18/2015   Lumbar radiculopathy 10/18/2015   Migraine 09/21/2015   Chronic tension-type headache, intractable 08/08/2015   Nonintractable headache 08/08/2015   Bilateral occipital neuralgia 06/04/2015   Migraine headache 06/04/2015   Angina pectoris (HCC) 02/07/2015   IDA (iron deficiency anemia) 09/19/2014   DDD (degenerative disc disease), thoracic 09/14/2014   DDD (degenerative disc disease), thoracolumbar 09/14/2014   Sacroiliac joint dysfunction 09/14/2014   Facet syndrome, lumbar 09/14/2014   Mixed hyperlipidemia 01/25/2014   PVC (premature ventricular contraction) 01/10/2014   CAD (coronary artery disease) 04/22/2013   PVD (peripheral vascular disease) (HCC) 04/22/2013   Long Q-T syndrome 07/09/2012   Chest pressure 07/09/2012   NSVT (nonsustained ventricular tachycardia) (HCC) 07/09/2012   Syncope 07/09/2012   Muscle spasm 07/20/2009    Past Surgical History:  Procedure Laterality Date   ABDOMINOPLASTY     tummy tuck ? year    BARIATRIC SURGERY  2001   CARDIAC CATHETERIZATION Left 04/16/2016   Procedure: Left Heart Cath and Coronary Angiography;  Surgeon: Antonieta Iba, MD;  Location: [facility];  Service: Cardiovascular;  Laterality: Left;   CHOLECYSTECTOMY  2001   COLONOSCOPY WITH PROPOFOL  N/A 09/28/2018   Procedure: COLONOSCOPY WITH PROPOFOL;  Surgeon: Pasty Spillers, MD;  Location: [facility];  Service: Endoscopy;  Laterality: N/A;   ESOPHAGOGASTRODUODENOSCOPY (EGD) WITH PROPOFOL N/A 09/28/2018   Procedure: ESOPHAGOGASTRODUODENOSCOPY (EGD) WITH BIOPSY;  Surgeon: Pasty Spillers, MD;  Location: [facility];  Service: Endoscopy;  Laterality: N/A;   EXTERNAL FIXATION LEG Left 10/29/2020   Procedure: EXTERNAL FIXATION LEFT KNEE;  Surgeon: Myrene Galas, MD;  Location: [facility] OR;  Service: Orthopedics;  Laterality: Left;   GALLBLADDER SURGERY     GASTRIC BYPASS  2001   GASTROPLASTY      OB History     Gravida  3   Para      Term      Preterm      AB  3   Living         SAB  3   IAB      Ectopic      Multiple  Live Births               Home Medications    Prior to Admission medications   Medication Sig Start Date End Date Taking? Authorizing Provider  albuterol (PROAIR HFA) 108 (90 Base) MCG/ACT inhaler Inhale 1-2 puffs into the lungs every 6 (six) hours as needed for wheezing or shortness of breath. 08/07/21  Yes McLean-Scocuzza, Pasty Spillers, MD  Biotin 10 MG CAPS Take 10 mg by mouth daily.   Yes [provider]  carvedilol (COREG) 6.25 MG tablet Take 1 tablet (6.25 mg total) by mouth 2 (two) times daily. 11/26/21  Yes McLean-Scocuzza, Pasty Spillers, MD  D3-50 1.25 MG (50000 UT) capsule TAKE 1 CAPSULE (50,000 UNITS TOTAL) BY MOUTH ONCE A WEEK. D3 06/25/21  Yes McLean-Scocuzza, Pasty Spillers, MD  diclofenac Sodium (VOLTAREN) 1 % GEL Apply topically. 12/18/20  Yes [provider]  doxycycline (VIBRAMYCIN) 100 MG capsule Take 1 capsule (100 mg total) by mouth 2 (two) times daily for 7 days. 11/30/21 12/07/21 Yes Eusebio Friendly B, PA-C  gabapentin (NEURONTIN) 400 MG capsule LIMIT 2 TABLETS IN THE A.M. AND MIDDAY AND 3 TABLETS EACH EVENING 11/26/21  Yes McLean-Scocuzza, Pasty Spillers, MD  methadone (DOLOPHINE) 10 MG tablet Limit 1 tablets by  mouth 3 times per day if tolerated per methadone clinic 11/26/21  Yes McLean-Scocuzza, Pasty Spillers, MD  mirtazapine (REMERON) 15 MG tablet Take 1.5 tablets (22.5 mg total) by mouth at bedtime. 08/15/21  Yes Jomarie Longs, MD  mupirocin ointment (BACTROBAN) 2 % Apply 1 Application topically 2 (two) times daily. 11/30/21  Yes Eusebio Friendly B, PA-C  nitroGLYCERIN (NITROSTAT) 0.4 MG SL tablet PLACE 1 TABLET UNDER THE TONGUE EVERY 5 MINUTES AS NEEDED FOR CHEST PAIN. AFTER 2ND DOSE CALL 911 04/28/21  Yes Worthy Rancher B, FNP  rosuvastatin (CRESTOR) 10 MG tablet TAKE ONE TABLET BY MOUTH DAILY AT 5 PM (NEED APPOINTMENT BEFORE NEXT REFILL) (VIAL) 11/26/21  Yes Gollan, Tollie Pizza, MD  Semaglutide-Weight Management (WEGOVY) 0.25 MG/0.5ML SOAJ Inject 0.25 mg into the skin once a week. X 12month 11/27/21  Yes McLean-Scocuzza, Pasty Spillers, MD  Semaglutide-Weight Management (WEGOVY) 0.5 MG/0.5ML SOAJ Inject 0.5 mg into the skin once a week. X 1 month 11/27/21  Yes McLean-Scocuzza, Pasty Spillers, MD  Semaglutide-Weight Management (WEGOVY) 1 MG/0.5ML SOAJ Inject 1 mg into the skin once a week. X 1 month 11/27/21  Yes McLean-Scocuzza, Pasty Spillers, MD  Semaglutide-Weight Management (WEGOVY) 1.7 MG/0.75ML SOAJ Inject 1.7 mg into the skin once a week. X 12month 11/27/21  Yes McLean-Scocuzza, Pasty Spillers, MD  Semaglutide-Weight Management (WEGOVY) 2.4 MG/0.75ML SOAJ Inject 2.4 mg into the skin once a week. X 12month 11/27/21  Yes McLean-Scocuzza, Pasty Spillers, MD  tiZANidine (ZANAFLEX) 4 MG tablet Take 4 mg by mouth 2 (two) times daily. 10/03/21  Yes [provider]  clotrimazole (CLOTRIMAZOLE AF) 1 % cream Apply 1 Application topically 2 (two) times daily. Skin folds 11/26/21   McLean-Scocuzza, Pasty Spillers, MD  collagenase (SANTYL) ointment Apply 1 application topically daily. X 1 week at night left abdomen Patient not taking: Reported on 11/26/2021 03/01/21   McLean-Scocuzza, Pasty Spillers, MD  fluconazole (DIFLUCAN) 150 MG tablet Take 1 tablet (150 mg total) by  mouth once a week. X 2-4 weeks 11/26/21   McLean-Scocuzza, Pasty Spillers, MD  hydrocortisone 2.5 % cream Apply topically 2 (two) times daily. Skin folds 11/26/21   McLean-Scocuzza, Pasty Spillers, MD  isosorbide mononitrate (IMDUR) 60 MG 24 hr tablet TAKE ONE TABLET  BY MOUTH DAILY AT 9 AM **PLEASE SCHEDULE OFFICE VISIT FOR FURTHER REFILLS** (VIAL) 11/26/21   Antonieta IbaGollan, Timothy J, MD  Magnesium Oxide 400 MG CAPS Take 1 capsule (400 mg total) by mouth daily. 02/03/20   Lennon AlstromVisser, Jacquelyn D, PA-C    Family History Family History  Problem Relation Age of Onset   Heart attack Mother    Cancer Mother        pancreatitic 5455   Early death Mother    Heart attack Father 5033       MI   Early death Father    Heart disease Father    Heart attack Brother    Heart disease Brother    Arthritis Brother    Depression Brother    Diabetes Brother    Heart attack Maternal Grandmother    Cancer Maternal Grandmother        pancreatitic    Heart disease Maternal Grandmother    Lung cancer Maternal Grandmother    Pancreatic cancer Maternal Grandmother    Cancer Maternal Uncle        pancreatitic    Cancer Paternal Grandmother        ? type    Diabetes Paternal Grandmother    Cancer Maternal Uncle        pancreatitic    Breast cancer Maternal Aunt    Cancer Maternal Aunt        GYN   Cancer Maternal Aunt        GYN    Social History Social History   Tobacco Use   Smoking status: Never   Smokeless tobacco: Never  Vaping Use   Vaping Use: Never used  Substance Use Topics   Alcohol use: Not Currently    Comment: holidays   Drug use: No     Allergies   Covid-19 (mrna) vaccine (pfizer) [covid-19 (mrna) vaccine], Penicillins, Bee venom, and Effexor xr [venlafaxine hcl]   Review of Systems Review of Systems  Constitutional:  Negative for fatigue and fever.  Gastrointestinal:  Negative for abdominal pain, nausea and vomiting.  Genitourinary:  Negative for difficulty urinating, dysuria and frequency.   Musculoskeletal:  Positive for back pain.  Skin:  Positive for color change, rash and wound.  Neurological:  Negative for weakness.     Physical Exam Triage Vital Signs ED Triage Vitals  Enc Vitals Group     BP      Pulse      Resp      Temp      Temp src      SpO2      Weight      Height      Head Circumference      Peak Flow      Pain Score      Pain Loc      Pain Edu?      Excl. in GC?    No data found.  Updated Vital Signs BP (!) 143/86 (BP Location: Left Arm)   Pulse 60   Temp 98.3 F (36.8 C) (Oral)   Resp 18   Ht 5\' 5"  (1.651 m)   Wt 275 lb (124.7 kg)   LMP 02/12/2016 (Approximate)   SpO2 92% Comment: Pt has HOCM and Asthma  BMI 45.76 kg/m   Physical Exam Vitals and nursing note reviewed.  Constitutional:      General: She is not in acute distress.    Appearance: Normal appearance. She is obese. She is not ill-appearing or toxic-appearing.  HENT:     Head: Normocephalic and atraumatic.  Eyes:     General: No scleral icterus.       Right eye: No discharge.        Left eye: No discharge.     Conjunctiva/sclera: Conjunctivae normal.  Cardiovascular:     Rate and Rhythm: Normal rate and regular rhythm.  Pulmonary:     Effort: Pulmonary effort is normal. No respiratory distress.     Breath sounds: Normal breath sounds.  Abdominal:     Palpations: Abdomen is soft.     Tenderness: There is no abdominal tenderness. There is no right CVA tenderness or left CVA tenderness.  Musculoskeletal:     Cervical back: Neck supple.  Skin:    General: Skin is dry.     Comments: Image included in chart is of the right anterolateral abdomen.  There is a shallow open wound that is slightly bleeding with surrounding erythema.  Yellowish drainage on the gauze pad that was covering the wound.  It is tender.  Neurological:     General: No focal deficit present.     Mental Status: She is alert. Mental status is at baseline.     Motor: No weakness.     Gait: Gait  normal.  Psychiatric:        Mood and Affect: Mood normal.        Behavior: Behavior normal.        Thought Content: Thought content normal.       UC Treatments / Results  Labs (all labs ordered are listed, but only abnormal results are displayed) Labs Reviewed - No data to display  EKG   Radiology No results found.  Procedures Procedures (including critical care time)  Medications Ordered in UC Medications - No data to display  Initial Impression / Assessment and Plan / UC Course  I have reviewed the triage vital signs and the nursing notes.  Pertinent labs & imaging results that were available during my care of the patient were reviewed by me and considered in my medical decision making (see chart for details).   56 year old female presenting for open wound of the right abdomen for the past couple of days.  She has a history of abdominoplasty 12 years ago and reports she occasionally has open wounds that appear but heal on their own.  She noticed her current one 2 days ago and says she thinks is getting worse and has been draining greenish fluid.  It is sore.  She has not had any fevers but her back is aching.  She denies urinary symptoms.  Image included in chart this of the wound.  It is a shallow open wound with surrounding erythema and there is yellowish-green material on the gauze pad that was overlying the wound.  Suspect that it is infected.  No other open wounds noted.  She has some erythema along her lower abdomen which is consistent with marks from her underwear.  We will treat her at this time with doxycycline as she has history of major allergy to penicillins including hives and breathing difficulty and should not take Bactrim DS due to prolonged QT.  Advised cleaning the wound regularly and applying the mupirocin ointment.  Take full course of antibiotics.  She has an appoint with general surgery in 2 days.  Advised to follow-up with them and her PCP.  Reviewed return  ER precautions.   Final Clinical Impressions(s) / UC Diagnoses   Final diagnoses:  Open  wound of anterior abdominal wall, initial encounter  Cellulitis of abdominal wall     Discharge Instructions      -You have an open wound to your abdomen.  You should clean it every day with soap and water and apply mupirocin ointment twice daily.  Change bandage twice a day. - Start the oral antibiotics and take full course. - Go to the emergency department if you develop fever or have worsening pain, deepening of wound or are not improving in the next few days.  Otherwise, follow-up with PCP and specialist as scheduled.     ED Prescriptions     Medication Sig Dispense Auth. Provider   doxycycline (VIBRAMYCIN) 100 MG capsule Take 1 capsule (100 mg total) by mouth 2 (two) times daily for 7 days. 14 capsule Eusebio Friendly B, PA-C   mupirocin ointment (BACTROBAN) 2 % Apply 1 Application topically 2 (two) times daily. 22 g Shirlee Latch, PA-C      PDMP not reviewed this encounter.   Shirlee Latch, PA-C 11/30/21 1157

## 2021-12-02 ENCOUNTER — Other Ambulatory Visit: Payer: Self-pay | Admitting: Internal Medicine

## 2021-12-02 DIAGNOSIS — T63441A Toxic effect of venom of bees, accidental (unintentional), initial encounter: Secondary | ICD-10-CM | POA: Insufficient documentation

## 2021-12-02 MED ORDER — EPINEPHRINE 0.3 MG/0.3ML IJ SOAJ: 0.3000 mg | INTRAMUSCULAR | 2 refills | Status: AC | PRN

## 2021-12-02 NOTE — Addendum Note (Signed)
Addended by: Orland Mustard on: 12/02/2021 10:23 AM   Modules accepted: Orders

## 2021-12-05 ENCOUNTER — Other Ambulatory Visit: Payer: Self-pay | Admitting: Cardiovascular Disease

## 2021-12-05 ENCOUNTER — Encounter: Payer: Self-pay | Admitting: Medical

## 2021-12-05 ENCOUNTER — Ambulatory Visit: Payer: Medicare Other | Attending: Medical | Admitting: Medical

## 2021-12-05 VITALS — BP 110/76 | HR 55 | Ht 66.0 in | Wt 277.2 lb

## 2021-12-05 DIAGNOSIS — I422 Other hypertrophic cardiomyopathy: Secondary | ICD-10-CM

## 2021-12-05 DIAGNOSIS — R0609 Other forms of dyspnea: Secondary | ICD-10-CM

## 2021-12-05 DIAGNOSIS — I5032 Chronic diastolic (congestive) heart failure: Secondary | ICD-10-CM | POA: Diagnosis not present

## 2021-12-05 DIAGNOSIS — Z6841 Body Mass Index (BMI) 40.0 and over, adult: Secondary | ICD-10-CM

## 2021-12-05 NOTE — Patient Instructions (Signed)
Medication Instructions:   Your physician has recommended you make the following change in your medication:   START taking Furosemide (Lasix) 20 MG once a day as needed for shortness of breath, or lower extremity swelling.  *If you need a refill on your cardiac medications before your next appointment, please call your pharmacy*   Lab Work:  Please go to the [facility] after your appointment today for a BNP lab draw.  If you have labs (blood work) drawn today and your tests are completely normal, you will receive your results only by: [place] (if you have MyChart) OR A paper copy in the mail If you have any lab test that is abnormal or we need to change your treatment, we will call you to review the results.    Follow-Up: At [facility], you and your health needs are our priority.  As part of our continuing mission to provide you with exceptional heart care, we have created designated Provider Care Teams.  These Care Teams include your primary Cardiologist (physician) and Advanced Practice Providers (APPs -  Physician Assistants and Nurse Practitioners) who all work together to provide you with the care you need, when you need it.  We recommend signing up for the patient portal called "MyChart".  Sign up information is provided on this After Visit Summary.  MyChart is used to connect with patients for Virtual Visits (Telemedicine).  Patients are able to view lab/test results, encounter notes, upcoming appointments, etc.  Non-urgent messages can be sent to your provider as well.   To learn more about what you can do with MyChart, go to [email or web address].    Your next appointment:   4 month(s)  The format for your next appointment:   In Person  Provider:   You may see Ida Rogue, MD or one of the following Advanced Practice Providers on your designated Care Team:   Murray Hodgkins, NP Christell Faith, PA-C Cadence Kathlen Mody, PA-C Gerrie Nordmann, NP    Other  Instructions   Important Information About Sugar

## 2021-12-05 NOTE — Progress Notes (Signed)
Cardiology Office Note:    Date:  12/05/2021   ID:  Cheyenne Gray, DOB 11/23/1965, MRN [id number]  PCP:  McLean-Scocuzza, Nino Glow, MD  [facility] Cardiologist:  Ida Rogue, MD  [facility] Electrophysiologist:  Vickie Epley, MD   Referring MD: McLean-Scocuzza, Olivia Mackie *   Chief Complaint: shortness of breath  History of Present Illness:    Cheyenne Gray is a 56 y.o. female with a hx of PVCs on monitor, tachycardia, syncope, chest pain, nonobstructive CAD by catheterization in early 2018, aortic atherosclerosis, hyperlipidemia, obesity s/p bariatric surgery, long QT syndrome, chronic pain on methadone, and PTSD, hypertrophic CM, vasovagal syncope who is being seen for follow-up.   Previous event monitor in 2015 with predominantly normal sinus rhythm and rare PVC.  Prior echocardiogram October 2016 with normal LVEF.  Lexiscan Myoview 01/2017 low risk scan with small to moderate sized region of predominantly fixed infarct in mid to distal anteroseptal, anteroseptal apical wall with mild peri-infarct ischemia, EF 51%. Echo May 2019 with normal EF.  Performed in the setting of lower extremity swelling and weight gain.  Noted improvement in lower extremity swelling with 15 pound weight loss following initiation of low-dose Lasix.   Her Crestor was previously had to be discontinued due to elevated liver enzymes, but was able to be resumed.  Clinic visit 11/09/19 for follow up after syncope. Noted dramatic increase in frequency of her hot flashes over the last two months. Also with  2 syncopal episodes one at [facility] and one in her kitchen. No prodromal symptoms. Reported loss of consciousness was witnessed by family member and lasted only a few seconds. Was noted to go long standing periods of time (multiple days) without eating and only drinking 5-7 cans of Coke per day. She was recommended for ZIO monitor. She also noted left arm/chest pain at rest and EKG showed NSR 66bpm,  frequent PVC but no acute ST/T wave changes and stat troponin was negative. She was started on Coreg 6.38m BID due to frequent PVCs on EKG. Preliminary report of ZIO monitor reviewed shows predominantly NSR. 2 short bursts of VT (longest 10 beats). 4 runs of SVT which were not triggered events. She had a PVC burden of 13.7% and her triggered events were associated with NSR and PVCs. No atrial fibrillation nor significant pause.Recommendation was to increase carvedilol to 12.5 mg twice daily.  Lasix was increased to 40 mg once daily for 5 days, followed by Lasix 20 mg daily thereafter.  She was started on potassium 10 M EQ daily.  Echo was ordered.  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 Echo showed no obstructive CM. CMRI Was ordered that showed normal left ventricular function and low burden of LGE. ICD was discussed with EP, but it was decided to watch and wait with annual echo and 7 day heart monitor. Last seen 05/2021 and echo and heart monitor were ordered.   Echo hosed LVEF 55-60%, no WMA, no significant LVOT gradient, G2DD, mild MR. Heart monitor showed NSR with rare supraventricular ectopy and frequent PVCs and no sustained arrhythmias.   Today, the patient reports occasional shortness of breath and chest pain. It's not as frequent as before. She denies lightheadedness and dizziness. She doesn't stay well-hydrated. She is drinking Coke 0. She denies passing out. She has gained weight since she broke her leg August 2022, weight went from 130s to 277lbs over the last few years. She uses  a cane to walk. She has occasional lower leg edema, it's dependent edema. She was previously on lasix 9m daily. PCP did labs that were unremarkalbe.   Past Medical History:  Diagnosis Date   (HFpEF) heart failure with preserved ejection fraction ([facility]    a. Echo 2014: EF 65-70%, nl WM, mildly dilated LA, PASP nl; b. 12/2014 Echo: EF 65-70%,  no rwma, mod septal hypertrophy w/o LVOT gradient or SAM; c. 07/2017 Echo: EF 55-60%, no rwma, mildly dil RV w/ nl syst fxn. Mildly dil RA. Dilated IVC w/ elevated CVP. Triv post effusion.   Anxiety    Asthma    Chronic pain    on methadone, managed by Dr. CPrimus Bravo  Concussion    hx of 4   Coronary artery disease, non-occlusive    a. LHC 1/18: proximal to mid LAD 40% stenosed, mid LAD 30% stenosed, mid RCA 20% stenosed, distal RCA 20% stenosed, EF 55-65%, LVEDP normal   Depression    DJD (degenerative joint disease), multiple sites    History of shingles    HOCM (hypertrophic obstructive cardiomyopathy) (HCC)    Hypertension    Iron deficiency anemia    Long QT interval    Obesity    Palpitations    a. 24 hour Holter: NSR, sinus brady down to 48, occasional PVCs & couplets, 8 beats NSVT; b. 30 day event monitor 2015: NSR with rare PVC.   Psoriasis    Syncope and collapse    Vitamin D deficiency    Wears dentures    full upper and lower    Past Surgical History:  Procedure Laterality Date   ABDOMINOPLASTY     tummy tuck ? year    BARIATRIC SURGERY  2001   CARDIAC CATHETERIZATION Left 04/16/2016   Procedure: Left Heart Cath and Coronary Angiography;  Surgeon: TMinna Merritts MD;  [facility];  Service: Cardiovascular;  Laterality: Left;   CHOLECYSTECTOMY  2001   COLONOSCOPY WITH PROPOFOL N/A 09/28/2018   Procedure: COLONOSCOPY WITH PROPOFOL;  Surgeon: TVirgel Manifold MD;  [facility]  Service: Endoscopy;  Laterality: N/A;   ESOPHAGOGASTRODUODENOSCOPY (EGD) WITH PROPOFOL N/A 09/28/2018   Procedure: ESOPHAGOGASTRODUODENOSCOPY (EGD) WITH BIOPSY;  Surgeon: TVirgel Manifold MD;  [facility]  Service: Endoscopy;  Laterality: N/A;   EXTERNAL FIXATION LEG Left 10/29/2020   Procedure: EXTERNAL FIXATION LEFT KNEE;  Surgeon: HAltamese Noxapater MD;  [facility]  Service: Orthopedics;  Laterality: Left;   GALLBLADDER SURGERY      GASTRIC BYPASS  2001   GASTROPLASTY      Current Medications: Current Meds  Medication Sig   albuterol (PROAIR HFA) 108 (90 Base) MCG/ACT inhaler Inhale 1-2 puffs into the lungs every 6 (six) hours as needed for wheezing or shortness of breath.   Biotin 10 MG CAPS Take 10 mg by mouth daily.   carvedilol (COREG) 6.25 MG tablet Take 1 tablet (6.25 mg total) by mouth 2 (two) times daily.   clotrimazole (CLOTRIMAZOLE AF) 1 % cream Apply 1 Application topically 2 (two) times daily. Skin folds   cyanocobalamin (VITAMIN B12) 1000 MCG/ML injection Inject into the muscle every 6 (six) months.   D3-50 1.25 MG (50000 UT) capsule TAKE 1 CAPSULE (50,000 UNITS TOTAL) BY MOUTH ONCE A WEEK. D3   diclofenac Sodium (VOLTAREN) 1 % GEL Apply topically.   doxycycline (VIBRAMYCIN) 100 MG capsule Take 1 capsule (100 mg total) by mouth 2 (two) times daily for 7  days.   EPINEPHrine 0.3 mg/0.3 mL IJ SOAJ injection Inject 0.3 mg into the muscle as needed for anaphylaxis.   gabapentin (NEURONTIN) 400 MG capsule LIMIT 2 TABLETS IN THE A.M. AND MIDDAY AND 3 TABLETS EACH EVENING   hydrocortisone 2.5 % cream Apply topically 2 (two) times daily. Skin folds   isosorbide mononitrate (IMDUR) 60 MG 24 hr tablet TAKE ONE TABLET BY MOUTH DAILY AT 9 AM **PLEASE SCHEDULE OFFICE VISIT FOR FURTHER REFILLS** (VIAL)   methadone (DOLOPHINE) 10 MG tablet Limit 1 tablets by mouth 3 times per day if tolerated per methadone clinic   mirtazapine (REMERON) 15 MG tablet Take 1.5 tablets (22.5 mg total) by mouth at bedtime.   nitroGLYCERIN (NITROSTAT) 0.4 MG SL tablet PLACE 1 TABLET UNDER THE TONGUE EVERY 5 MINUTES AS NEEDED FOR CHEST PAIN. AFTER 2ND DOSE CALL 911   rosuvastatin (CRESTOR) 10 MG tablet TAKE ONE TABLET BY MOUTH DAILY AT 5 PM (NEED APPOINTMENT BEFORE NEXT REFILL) (VIAL)   Current Facility-Administered Medications for the 12/05/21 encounter (Office Visit) with Kathlen Mody, Mina Carlisi H, PA-C  Medication   cyanocobalamin ((VITAMIN B-12))  injection 1,000 mcg     Allergies:   Covid-19 (mrna) vaccine (pfizer) [covid-19 (mrna) vaccine], Penicillins, Bee venom, and Effexor xr [venlafaxine hcl]   Social History   Socioeconomic History   Marital status: Widowed    Spouse name: Not on file   Number of children: 1   Years of education: assoc degree   Highest education level: Associate degree: occupational, Hotel manager, or vocational program  Occupational History   Not on file  Tobacco Use   Smoking status: Never   Smokeless tobacco: Never  Vaping Use   Vaping Use: Never used  Substance and Sexual Activity   Alcohol use: Not Currently    Comment: holidays   Drug use: No   Sexual activity: Not Currently  Other Topics Concern   Not on file  Social History Narrative   Adopted daughter [name] [phone] (now lives in [place] going to [place] state has apt there)   Significant other mac [phone], former husband died    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    Never smoker    No guns   Wears seat belt    No caffeine   Social Determinants of Health   Financial Resource Strain: High Risk (11/06/2017)   Overall Financial Resource Strain (CARDIA)    Difficulty of Paying Living Expenses: Very hard  Food Insecurity: No Food Insecurity (11/06/2017)   Hunger Vital Sign    Worried About Running Out of Food in the Last Year: Never true    [place] in the Last Year: Never true  Transportation Needs: No Transportation Needs (11/06/2017)   PRAPARE - Hydrologist (Medical): No    Lack of Transportation (Non-Medical): No  Physical Activity: Inactive (11/06/2017)   Exercise Vital Sign    Days of Exercise per Week: 0 days    Minutes of Exercise per Session: 0 min  Stress: Stress Concern Present (11/06/2017)   [place]    Feeling of Stress : Very much  Social Connections: Socially Isolated (11/06/2017)   Social Connection and Isolation Panel [NHANES]     Frequency of Communication with Friends and Family: Once a week    Frequency of Social Gatherings with Friends and Family: Never    Attends Religious Services: Never    Marine scientist or  Organizations: No    Attends Archivist Meetings: Never    Marital Status: Widowed     Family History: The patient's family history includes Arthritis in her brother; Breast cancer in her maternal aunt; Cancer in her maternal aunt, maternal aunt, maternal grandmother, maternal uncle, maternal uncle, mother, and paternal grandmother; Depression in her brother; Diabetes in her brother and paternal grandmother; Early death in her father and mother; Heart attack in her brother, maternal grandmother, and mother; Heart attack (age of onset: 25) in her father; Heart disease in her brother, father, and maternal grandmother; Lung cancer in her maternal grandmother; Pancreatic cancer in her maternal grandmother.  ROS:   Please see the history of present illness.     All other systems reviewed and are negative.  EKGs/Labs/Other Studies Reviewed:    The following studies were reviewed today:  Echo 10/2021  1. Left ventricular ejection fraction, by estimation, is 55 to 60%. The  left ventricle has normal function. The left ventricle has no regional  wall motion abnormalities. There is moderate left ventricular hypertrophy.  No significant LVOT gradient  measured. Left ventricular diastolic parameters are consistent with Grade  II diastolic dysfunction (pseudonormalization).   2. Right ventricular systolic function is normal. The right ventricular  size is normal.   3. Left atrial size was moderately dilated.   4. The mitral valve is normal in structure. Mild mitral valve  regurgitation. No evidence of mitral stenosis.   5. The aortic valve is normal in structure. Aortic valve regurgitation is  not visualized. Aortic valve sclerosis/calcification is present, without  any evidence of aortic  stenosis.   6. The inferior vena cava is normal in size with greater than 50%  respiratory variability, suggesting right atrial pressure of 3 mmHg.   Heart monitor 06/2021 HR 42 - 136 bpm, average 60 bpm. Rare supraventricular ectopy, <1%. Frequent ventricular ectopy, 5.8%. No sustained arrhythmias.   Lysbeth Galas T. Quentin Ore, MD, [facility], [facility] Cardiac Electrophysiology  EKG:  EKG is ordered today.  The ekg ordered today demonstrates SB 1st degree AV block, 55bpm, nonspecific ST changes  Recent Labs: 11/26/2021: ALT 17; BUN 9; Creatinine, Ser 0.89; Hemoglobin 12.4; Platelets 229.0; Potassium 4.0; Sodium 141; TSH 4.66  Recent Lipid Panel    Component Value Date/Time   CHOL 183 11/26/2021 0843   CHOL 114 10/02/2016 1037   CHOL 147 05/04/2013 1556   TRIG 109.0 11/26/2021 0843   TRIG 79 05/04/2013 1556   HDL 49.00 11/26/2021 0843   HDL 55 10/02/2016 1037   HDL 58 05/04/2013 1556   CHOLHDL 4 11/26/2021 0843   VLDL 21.8 11/26/2021 0843   VLDL 16 05/04/2013 1556   LDLCALC 112 (H) 11/26/2021 0843   LDLCALC 62 03/01/2021 1520   LDLCALC 73 05/04/2013 1556     Physical Exam:    VS:  BP 110/76 (BP Location: Left Arm, Patient Position: Sitting, Cuff Size: Large)   Pulse (!) 55   Ht _0  (1.676 m)   Wt 277 lb 3.2 oz (125.7 kg)   LMP 02/12/2016 (Approximate)   SpO2 98%   BMI 44.74 kg/m     Wt Readings from Last 3 Encounters:  12/05/21 277 lb 3.2 oz (125.7 kg)  11/30/21 275 lb (124.7 kg)  11/26/21 282 lb 3.2 oz (128 kg)     GEN:  Well nourished, well developed in no acute distress HEENT: Normal NECK: No JVD; No carotid bruits LYMPHATICS: No lymphadenopathy CARDIAC: RR, bradycardia, no murmurs, rubs, gallops RESPIRATORY:  Clear to auscultation without rales, wheezing or rhonchi  ABDOMEN: Soft, non-tender, non-distended MUSCULOSKELETAL:  No edema; No deformity  SKIN: Warm and dry NEUROLOGIC:  Alert and oriented x 3 PSYCHIATRIC:  Normal affect   ASSESSMENT:    1. Dyspnea on  exertion   2. Chronic heart failure with preserved ejection fraction ([place])   3. Hypertrophic cardiomyopathy (HCC)   4. Class 3 severe obesity due to excess calories with body mass index (BMI) of 40.0 to 44.9 in adult, unspecified whether serious comorbidity present ([place])    PLAN:    In order of problems listed above:  Shortness of breath HFpEF Obesity Patient reports shortness of breath, worse on exertion. She has h/o gastric bypass with weight as low as 130lbs in the past, it's now 277lbs. She uses a cane at baseline, so overall function is low. She appears euvolemic on exam today. Follow-up echo and heart monitor were reassuring with no significant changes. I will give her lasix 25m to use as needed to lower leg edema. I will check BNP for reassurance. PCP did labs earlier this month that were unremarkable. Suspect weight is contributing to breathing issues, lifestyle changes were discussed in detail today. Prior cath in 2018 showed nonobstructive CAD. We will see her back in 4 months and if brething has not changed can consider stress testing.   Hypertrophic Cardiomyopathy Known mutation in the MYBPC 3 gene. She denies syncopal episodes. Normal LV function and low burden of LGE on cMRI. Repeat echo showed normal LVEF with no significant LVOT gradient, overall no change. Heart monitor showed rare PVCs and no sustained arrhythmias. Plan to repeat echo and heart monitor annually.    Disposition: Follow up in 4 month(s) with MD/APP     Signed, Sharhonda Atwood HNinfa Meeker PA-C  12/05/2021 1:36 PM    [facility]

## 2021-12-06 DIAGNOSIS — S82142A Displaced bicondylar fracture of left tibia, initial encounter for closed fracture: Secondary | ICD-10-CM | POA: Diagnosis not present

## 2021-12-07 ENCOUNTER — Other Ambulatory Visit: Payer: Self-pay | Admitting: Psychiatry

## 2021-12-07 ENCOUNTER — Other Ambulatory Visit: Payer: Self-pay | Admitting: Internal Medicine

## 2021-12-07 DIAGNOSIS — F4310 Post-traumatic stress disorder, unspecified: Secondary | ICD-10-CM

## 2021-12-07 DIAGNOSIS — G4701 Insomnia due to medical condition: Secondary | ICD-10-CM

## 2021-12-07 DIAGNOSIS — F331 Major depressive disorder, recurrent, moderate: Secondary | ICD-10-CM

## 2021-12-07 DIAGNOSIS — F411 Generalized anxiety disorder: Secondary | ICD-10-CM

## 2021-12-10 ENCOUNTER — Ambulatory Visit (INDEPENDENT_AMBULATORY_CARE_PROVIDER_SITE_OTHER): Payer: Medicare Other | Admitting: Licensed Clinical Social Worker

## 2021-12-10 DIAGNOSIS — F4310 Post-traumatic stress disorder, unspecified: Secondary | ICD-10-CM | POA: Diagnosis not present

## 2021-12-10 NOTE — Plan of Care (Signed)
  Problem: Reduce the negative impact trauma related symptoms have on social, occupational, and family functioning per pt self report 3 out of 5 sessions documented. PTSD-Trauma Disorder CCP Problem  1  Goal: LTG: Reduce frequency, intensity, and duration of PTSD symptoms so daily functioning is improved: Input needed on appropriate metric.  per pt self report Outcome: Progressing Goal: LTG: Elimination of maladaptive behaviors and thinking patterns which interfere with resolution of trauma: per pt self report Outcome: Progressing Intervention: Encourage verbalization of feelings/concerns/expectations Note: Pt reports significant progress since last session

## 2021-12-10 NOTE — Progress Notes (Signed)
Virtual Visit via Video Note  I connected with Cheyenne Gray on 12/10/21 at  2:00 PM EDT by a video enabled telemedicine application and verified that I am speaking with the correct person using two identifiers.  Location: Patient: home Provider: remote office [place], [place])   I discussed the limitations of evaluation and management by telemedicine and the availability of in person appointments. The patient expressed understanding and agreed to proceed.   I discussed the assessment and treatment plan with the patient. The patient was provided an opportunity to ask questions and all were answered. The patient agreed with the plan and demonstrated an understanding of the instructions.   The patient was advised to call back or seek an in-person evaluation if the symptoms worsen or if the condition fails to improve as anticipated.  I provided 35 minutes of non-face-to-face time during this encounter.   Lead, LCSW  THERAPIST PROGRESS NOTE  Session Time:  2-235p  Participation Level: Active  Behavioral Response: NeatAlertDepressed  Type of Therapy: Individual Therapy  Treatment Goals addressed:   Problem: Reduce the negative impact trauma related symptoms have on social, occupational, and family functioning per pt self report 3 out of 5 sessions documented. PTSD-Trauma Disorder CCP Problem  1  Goal: LTG: Reduce frequency, intensity, and duration of PTSD symptoms so daily functioning is improved: Input needed on appropriate metric.  per pt self report Outcome: Progressing Goal: LTG: Elimination of maladaptive behaviors and thinking patterns which interfere with resolution of trauma: per pt self report Outcome: Progressing Intervention: Encourage verbalization of feelings/concerns/expectations Note: Pt reports significant progress since last session  ProgressTowards Goals: Progressing   Interventions: CBT and Motivational Interviewing  Summary: Cheyenne Gray  is a 56 y.o. female who presents with continuing symptoms related to trauma. Pt reports that overall mood has been up and down and that pt is experiencing stress/anxiety at times.   Allowed pt to explore and express thoughts and feelings associated with recent life situations and external stressors. Patient reports that she has been off her medication for a while. Patient forgot her medication for a few days, and made the decision at that point to just not go back to the medication. Patient reports that since stopping the medication she has felt more motivated, more initiative, and feels more like her "old self" again. Initially patient thought this was something that she was just feeling on her own, but patient states that other people mentioned that her behavior is more like behavior that they have seen her engage in in the past--in a positive way. Patient feels like she has more energy, is less irritable, and less fatigued overall. Patient wants to go out and engage with society, and do things that she enjoys. Patient is reflective of overall progress and encouraged that she is feeling better.  Explored relationship with daughter, and thoughts and feelings that are triggered when patient discusses it. Patient has come to the realization that her daughter's choices may have little to do with her, so she is not going to take it personally.  Patient made the statement that she's not 100% sure, but she may be ready to date again in the near future.  Allowed patient to explore overall progress in the past year. Patient reports that she is very happy with where she is right now. Goals currently are maintaining overall progress, and allowing patient to sit personal goals and articulate steps that she needs to take to achieve personal goals. Patient reflects  overall understanding and willingness to cooperate.  Continued recommendations are as follows: self care behaviors, positive social engagements, focusing  on overall work/home/life balance, and focusing on positive physical and emotional wellness.    Suicidal/Homicidal: No  Therapist Response: Pt is continuing to apply interventions learned in session into daily life situations. Pt is currently on track to meet goals utilizing interventions mentioned above. Personal growth and progress noted. Treatment to continue as indicated.   Plan: Return again in 4 weeks.  Diagnosis:  No diagnosis found.   Collaboration of Care: Other Pt to continue with psychiatrist of record, Dr. Elna Breslow  Patient/Guardian was advised Release of Information must be obtained prior to any record release in order to collaborate their care with an outside provider. Patient/Guardian was advised if they have not already done so to contact the registration department to sign all necessary forms in order for [place] to release information regarding their care.   Consent: Patient/Guardian gives verbal consent for treatment and assignment of benefits for services provided during this visit. Patient/Guardian expressed understanding and agreed to proceed.   Ernest Haber Lenardo Westwood, LCSW 12/10/2021

## 2021-12-16 ENCOUNTER — Other Ambulatory Visit (INDEPENDENT_AMBULATORY_CARE_PROVIDER_SITE_OTHER): Payer: Medicare Other

## 2021-12-16 DIAGNOSIS — Z79891 Long term (current) use of opiate analgesic: Secondary | ICD-10-CM | POA: Diagnosis not present

## 2021-12-16 DIAGNOSIS — G894 Chronic pain syndrome: Secondary | ICD-10-CM | POA: Diagnosis not present

## 2021-12-16 DIAGNOSIS — M791 Myalgia, unspecified site: Secondary | ICD-10-CM | POA: Diagnosis not present

## 2021-12-16 DIAGNOSIS — Z79899 Other long term (current) drug therapy: Secondary | ICD-10-CM | POA: Diagnosis not present

## 2021-12-16 DIAGNOSIS — R319 Hematuria, unspecified: Secondary | ICD-10-CM

## 2021-12-16 DIAGNOSIS — R748 Abnormal levels of other serum enzymes: Secondary | ICD-10-CM

## 2021-12-16 DIAGNOSIS — I10 Essential (primary) hypertension: Secondary | ICD-10-CM | POA: Diagnosis not present

## 2021-12-16 DIAGNOSIS — G8929 Other chronic pain: Secondary | ICD-10-CM | POA: Diagnosis not present

## 2021-12-16 DIAGNOSIS — M542 Cervicalgia: Secondary | ICD-10-CM | POA: Diagnosis not present

## 2021-12-16 DIAGNOSIS — M5440 Lumbago with sciatica, unspecified side: Secondary | ICD-10-CM | POA: Diagnosis not present

## 2021-12-16 DIAGNOSIS — G8921 Chronic pain due to trauma: Secondary | ICD-10-CM | POA: Diagnosis not present

## 2021-12-19 ENCOUNTER — Other Ambulatory Visit: Payer: Self-pay | Admitting: Internal Medicine

## 2021-12-19 ENCOUNTER — Encounter: Payer: Self-pay | Admitting: Internal Medicine

## 2021-12-19 DIAGNOSIS — N3000 Acute cystitis without hematuria: Secondary | ICD-10-CM

## 2021-12-19 LAB — URINALYSIS, ROUTINE W REFLEX MICROSCOPIC
Bacteria, UA: NONE SEEN /HPF
Bilirubin Urine: NEGATIVE
Glucose, UA: NEGATIVE
Hyaline Cast: NONE SEEN /LPF
Ketones, ur: NEGATIVE
Nitrite: NEGATIVE
Protein, ur: NEGATIVE
Specific Gravity, Urine: 1.017 (ref 1.001–1.035)
pH: 5.5 (ref 5.0–8.0)

## 2021-12-19 LAB — MICROSCOPIC MESSAGE

## 2021-12-19 LAB — URINE CULTURE
MICRO NUMBER:: 13994524
SPECIMEN QUALITY:: ADEQUATE

## 2021-12-19 MED ORDER — CIPROFLOXACIN HCL 250 MG PO TABS: 250.0000 mg | ORAL_TABLET | Freq: Two times a day (BID) | ORAL | 0 refills | Status: AC

## 2021-12-20 ENCOUNTER — Other Ambulatory Visit: Payer: Self-pay

## 2021-12-20 ENCOUNTER — Other Ambulatory Visit: Payer: Self-pay | Admitting: Internal Medicine

## 2021-12-20 DIAGNOSIS — B3731 Acute candidiasis of vulva and vagina: Secondary | ICD-10-CM

## 2021-12-20 LAB — ALKALINE PHOSPHATASE, ISOENZYMES
Alkaline Phosphatase: 144 IU/L — ABNORMAL HIGH (ref 44–121)
BONE FRACTION: 34 % (ref 14–68)
INTESTINAL FRAC.: 3 % (ref 0–18)
LIVER FRACTION: 63 % (ref 18–85)

## 2021-12-20 MED ORDER — FLUCONAZOLE 150 MG PO TABS: 150.0000 mg | ORAL_TABLET | Freq: Once | ORAL | 0 refills | Status: AC

## 2021-12-23 ENCOUNTER — Telehealth: Payer: Self-pay

## 2021-12-23 NOTE — Telephone Encounter (Signed)
Patient states she is returning Cheyenne Gray, CMA's call regarding her results.  I transferred call to Cheyenne. 

## 2021-12-23 NOTE — Telephone Encounter (Signed)
LMOM for pt to CB in regards to labs 

## 2021-12-24 ENCOUNTER — Ambulatory Visit: Payer: Medicare Other | Admitting: Medical

## 2021-12-26 ENCOUNTER — Other Ambulatory Visit: Payer: Self-pay | Admitting: Cardiovascular Disease

## 2022-01-05 DIAGNOSIS — S82142A Displaced bicondylar fracture of left tibia, initial encounter for closed fracture: Secondary | ICD-10-CM | POA: Diagnosis not present

## 2022-01-06 ENCOUNTER — Encounter: Payer: Self-pay | Admitting: Oncology

## 2022-01-08 ENCOUNTER — Encounter: Payer: Medicare Other | Attending: Internal Medicine | Admitting: Internal Medicine

## 2022-01-08 DIAGNOSIS — X58XXXA Exposure to other specified factors, initial encounter: Secondary | ICD-10-CM | POA: Diagnosis not present

## 2022-01-08 DIAGNOSIS — Z6841 Body Mass Index (BMI) 40.0 and over, adult: Secondary | ICD-10-CM | POA: Insufficient documentation

## 2022-01-08 DIAGNOSIS — I110 Hypertensive heart disease with heart failure: Secondary | ICD-10-CM | POA: Insufficient documentation

## 2022-01-08 DIAGNOSIS — I4581 Long QT syndrome: Secondary | ICD-10-CM | POA: Insufficient documentation

## 2022-01-08 DIAGNOSIS — E611 Iron deficiency: Secondary | ICD-10-CM | POA: Diagnosis not present

## 2022-01-08 DIAGNOSIS — I739 Peripheral vascular disease, unspecified: Secondary | ICD-10-CM | POA: Insufficient documentation

## 2022-01-08 DIAGNOSIS — I5032 Chronic diastolic (congestive) heart failure: Secondary | ICD-10-CM | POA: Insufficient documentation

## 2022-01-08 DIAGNOSIS — E668 Other obesity: Secondary | ICD-10-CM | POA: Insufficient documentation

## 2022-01-08 DIAGNOSIS — F4310 Post-traumatic stress disorder, unspecified: Secondary | ICD-10-CM | POA: Insufficient documentation

## 2022-01-08 DIAGNOSIS — L98498 Non-pressure chronic ulcer of skin of other sites with other specified severity: Secondary | ICD-10-CM | POA: Diagnosis not present

## 2022-01-08 DIAGNOSIS — Z9884 Bariatric surgery status: Secondary | ICD-10-CM | POA: Diagnosis not present

## 2022-01-08 DIAGNOSIS — S31103A Unspecified open wound of abdominal wall, right lower quadrant without penetration into peritoneal cavity, initial encounter: Secondary | ICD-10-CM | POA: Insufficient documentation

## 2022-01-08 DIAGNOSIS — I421 Obstructive hypertrophic cardiomyopathy: Secondary | ICD-10-CM | POA: Insufficient documentation

## 2022-01-08 DIAGNOSIS — L409 Psoriasis, unspecified: Secondary | ICD-10-CM | POA: Insufficient documentation

## 2022-01-08 DIAGNOSIS — M545 Low back pain, unspecified: Secondary | ICD-10-CM | POA: Diagnosis not present

## 2022-01-08 NOTE — Progress Notes (Signed)
ARAYA, ROEL ([id number]) [id number] Page 1 of 9 Visit Report for 01/08/2022 Allergy List Details Patient Name: Date of Service: Cheyenne Born DIE M. 01/08/2022 8:45 A M Medical Record Number: [id number] Patient Account Number: [id number] Date of Birth/Sex: Treating RN: 1965-11-26 (56 y.o. Orvan Falconer Primary Care Chele Cornell: McLean-Scocuzza, Olivia Mackie Other Clinician: Referring Krissia Schreier: Treating Theodore Rahrig/Extender: Eldridge Dace, [name], Harless Nakayama in Treatment: 0 Allergies Active Allergies COVID-19 (SARS-CoV-2) vaccine, Ad26 penicillin bee venom protein (honey bee) Effexor Allergy Notes Electronic Signature(s) Signed: 01/08/2022 9:46:19 AM By: Carlene Coria RN Entered By: Carlene Coria on 01/08/2022 08:55:43 -------------------------------------------------------------------------------- Arrival Information Details Patient Name: Date of Service: Cheyenne Born DIE M. 01/08/2022 8:45 A M Medical Record Number: [id number] Patient Account Number: [id number] Date of Birth/Sex: Treating RN: [birth year] (56 y.o. Orvan Falconer Primary Care Carmen Tolliver: McLean-Scocuzza, Olivia Mackie Other Clinician: Referring Bohdi Leeds: Treating Siddharth Babington/Extender: Eldridge Dace, [name], Harless Nakayama in Treatment: 0 Visit Information Patient Arrived: Cane Arrival Time: 08:53 Accompanied By: self Transfer Assistance: None Patient Identification Verified: Yes Secondary Verification Process Completed: Yes Patient Requires Transmission-Based Precautions: No Patient Has Alerts: No Cheyenne Gray, Cheyenne Gray ([id number]) [id number] Page 2 of 9 Electronic Signature(s) Signed: 01/08/2022 9:46:19 AM By: Carlene Coria RN Entered By: Carlene Coria on 01/08/2022 08:53:36 -------------------------------------------------------------------------------- Clinic Level of Care Assessment Details Patient Name: Date of Service: Cheyenne Born DIE M.  01/08/2022 8:45 A M Medical Record Number: [id number] Patient Account Number: [id number] Date of Birth/Sex: Treating RN: [birth year] (56 y.o. Orvan Falconer Primary Care Bird Swetz: McLean-Scocuzza, Olivia Mackie Other Clinician: Referring Givanni Staron: Treating Toy Samarin/Extender: RO BSO Delane Ginger, Masury McLean-Scocuzza, Harless Nakayama in Treatment: 0 Clinic Level of Care Assessment Items TOOL 2 Quantity Score X- 1 0 Use when only an EandM is performed on the INITIAL visit ASSESSMENTS - Nursing Assessment / Reassessment X- 1 20 General Physical Exam (combine w/ comprehensive assessment (listed just below) when performed on new pt. evals) X- 1 25 Comprehensive Assessment (HX, ROS, Risk Assessments, Wounds Hx, etc.) ASSESSMENTS - Wound and Skin A ssessment / Reassessment X - Simple Wound Assessment / Reassessment - one wound 1 5 []  - 0 Complex Wound Assessment / Reassessment - multiple wounds []  - 0 Dermatologic / Skin Assessment (not related to wound area) ASSESSMENTS - Ostomy and/or Continence Assessment and Care []  - 0 Incontinence Assessment and Management []  - 0 Ostomy Care Assessment and Management (repouching, etc.) PROCESS - Coordination of Care X - Simple Patient / Family Education for ongoing care 1 15 []  - 0 Complex (extensive) Patient / Family Education for ongoing care X- 1 10 Staff obtains Programmer, systems, Records, T Results / Process Orders est []  - 0 Staff telephones HHA, Nursing Homes / Clarify orders / etc []  - 0 Routine Transfer to another Facility (non-emergent condition) []  - 0 Routine Hospital Admission (non-emergent condition) []  - 0 New Admissions / Biomedical engineer / Ordering NPWT Apligraf, etc. , X- 1 20 Emergency Hospital Admission (emergent condition) X- 1 10 Simple Discharge Coordination []  - 0 Complex (extensive) Discharge Coordination PROCESS - Special Needs []  - 0 Pediatric / Minor Patient Management []  - 0 Isolation Patient Management []  - 0 Hearing  / Language / Visual special needs []  - 0 Assessment of Community assistance (transportation, D/C planning, etc.) Cheyenne Gray, Cheyenne Gray ([id number]) [id number] Page 3 of 9 []  - 0 Additional assistance / Altered mentation []  - 0 Support Surface(s) Assessment (bed, cushion, seat, etc.) INTERVENTIONS - Wound Cleansing /  Measurement X- 1 5 Wound Imaging (photographs - any number of wounds) []  - 0 Wound Tracing (instead of photographs) X- 1 5 Simple Wound Measurement - one wound []  - 0 Complex Wound Measurement - multiple wounds X- 1 5 Simple Wound Cleansing - one wound []  - 0 Complex Wound Cleansing - multiple wounds INTERVENTIONS - Wound Dressings X - Small Wound Dressing one or multiple wounds 1 10 []  - 0 Medium Wound Dressing one or multiple wounds []  - 0 Large Wound Dressing one or multiple wounds []  - 0 Application of Medications - injection INTERVENTIONS - Miscellaneous []  - 0 External ear exam []  - 0 Specimen Collection (cultures, biopsies, blood, body fluids, etc.) []  - 0 Specimen(s) / Culture(s) sent or taken to Lab for analysis []  - 0 Patient Transfer (multiple staff / / Similar devices) []  - 0 Simple Staple / Suture removal (25 or less) []  - 0 Complex Staple / Suture removal (26 or more) []  - 0 Hypo / Hyperglycemic Management (close monitor of Blood Glucose) []  - 0 Ankle / Brachial Index (ABI) - do not check if billed separately Has the patient been seen at the hospital within the last three years: Yes Total Score: 130 Level Of Care: New/Established - Level 4 Electronic Signature(s) Signed: 01/08/2022 9:46:19 AM By: RN Entered By: on 01/08/2022 09:15:27 -------------------------------------------------------------------------------- Encounter Discharge Information Details Patient Name: Date of Service: DIE M. 01/08/2022 8:45 A M Medical Record Number: Patient Account  Number: Date of Birth/Sex: Treating RN: 1965-09-23 (56 y.o. Primary Care Kynli Chou: McLean-Scocuzza, Other Clinician: Referring Ulanda Tackett: Treating Laymon Stockert/Extender: RO BSO , [name], in Treatment: 0 Encounter Discharge Information Items Discharge Condition: Stable Ambulatory Status: Ambulatory Discharge Destination: Home Transportation: [address] [place], BRANUM Jacona (01/10/2022) [id number] Page 4 of 9 Accompanied By: self Schedule Follow-up Appointment: Yes Clinical Summary of Care: Electronic Signature(s) Signed: 01/08/2022 9:46:19 AM By: [id number] RN Entered By: [id number] on 01/08/2022 09:16:23 -------------------------------------------------------------------------------- Lower Extremity Assessment Details Patient Name: Date of Service: 59 DIE M. 01/08/2022 8:45 A M Medical Record Number: French Ana Patient Account Number: Dorris Carnes Date of Birth/Sex: Treating RN: 29-Sep-1965 (56 y.o. Gwenette Greet Primary Care Borna Wessinger: McLean-Scocuzza, [place] Other Clinician: Referring Shanautica Forker: Treating Marleah Beever/Extender: [id number], [name], 05-07-1978 in Treatment: 0 Electronic Signature(s) Signed: 01/08/2022 9:46:19 AM By: Yevonne Pax RN Entered By: Yevonne Pax on 01/08/2022 09:00:23 -------------------------------------------------------------------------------- Multi Wound Chart Details Patient Name: Date of Service: Cheyenne Gentry DIE M. 01/08/2022 8:45 A M Medical Record Number: [id number] Patient Account Number: [id number] Date of Birth/Sex: Treating RN: [birth year] (56 y.o. Cheyenne Gray Primary Care Jaylene Schrom: McLean-Scocuzza, French Ana Other Clinician: Referring Thierry Dobosz: Treating Tajon Moring/Extender: RO BSO N, [name], Chauncey Mann in Treatment: 0 Vital Signs Height(in): 65 Pulse(bpm): 56 Weight(lbs): 270 Blood Pressure(mmHg):  141/87 Body Mass Index(BMI): 44.9 Temperature(F): 98.1 Respiratory Rate(breaths/min): 18 [1:Photos:] [N/A:N/A] Right, Distal Abdomen - Lower N/A N/A Wound Location: Quadrant Gradually Appeared N/A N/A Wounding Event: Lesion N/A N/A Primary Etiology: Asthma, Arrhythmia, Congestive Heart N/A N/A Comorbid History: Failure, Coronary Artery Disease, Hypertension, Peripheral Venous Disease 12/15/2021 N/A N/A Date Acquired: 0 N/A N/A Weeks of Treatment: Open N/A N/A Wound Status: No N/A N/A Wound Recurrence: 1.5x2x0.1 N/A N/A Measurements L x W x D (cm) 2.356 N/A N/A A (cm) : rea 0.236 N/A N/A Volume (cm) : Full Thickness Without Exposed  N/A N/A Classification: Support Structures Medium N/A N/A Exudate Amount: Serosanguineous N/A N/A Exudate Type: red, brown N/A N/A Exudate Color: Medium (34-66%) N/A N/A Granulation Amount: Red, Pink N/A N/A Granulation Quality: Medium (34-66%) N/A N/A Necrotic Amount: Fat Layer (Subcutaneous Tissue): Yes N/A N/A Exposed Structures: Fascia: No Tendon: No Muscle: No Joint: No Bone: No None N/A N/A Epithelialization: Treatment Notes Wound #1 (Abdomen - Lower Quadrant) Wound Laterality: Right, Distal Cleanser Peri-Wound Care Topical Primary Dressing Hydrofera Blue Ready Transfer Foam, 2.5x2.5 (in/in) Discharge Instruction: Apply Hydrofera Blue Ready to wound bed as directed Secondary Dressing Coverlet Latex-Free Fabric Adhesive Dressings Discharge Instruction: 1.5 x 2 Secured With Compression Wrap Compression Stockings Add-Ons Electronic Signature(s) Signed: 01/08/2022 4:38:50 PM By: Baltazar Najjar MD Entered By: Baltazar Najjar on 01/08/2022 09:21:20 Multi-Disciplinary Care Plan Details -------------------------------------------------------------------------------- Cheyenne Gray ([id number]) [id number] Page 6 of 9 Patient Name: Date of Service: Cheyenne Gentry DIE M. 01/08/2022  8:45 A M Medical Record Number: [id number] Patient Account Number: [id number] Date of Birth/Sex: Treating RN: 06-Nov-1965 (56 y.o. Cheyenne Gray Primary Care Mendell Bontempo: McLean-Scocuzza, French Ana Other Clinician: Referring Thadd Apuzzo: Treating Georgeana Oertel/Extender: RO BSO Dorris Carnes, [name], [name] in Treatment: 0 Active Inactive Wound/Skin Impairment Nursing Diagnoses: Knowledge deficit related to ulceration/compromised skin integrity Goals: Patient/caregiver will verbalize understanding of skin care regimen Date Initiated: 01/08/2022 Target Resolution Date: 02/08/2022 Goal Status: Active Ulcer/skin breakdown will have a volume reduction of 30% by week 4 Date Initiated: 01/08/2022 Target Resolution Date: 03/10/2022 Goal Status: Active Ulcer/skin breakdown will have a volume reduction of 50% by week 8 Date Initiated: 01/08/2022 Target Resolution Date: 04/10/2022 Goal Status: Active Ulcer/skin breakdown will have a volume reduction of 80% by week 12 Date Initiated: 01/08/2022 Target Resolution Date: 05/11/2022 Goal Status: Active Ulcer/skin breakdown will heal within 14 weeks Date Initiated: 01/08/2022 Target Resolution Date: 06/09/2022 Goal Status: Active Interventions: Assess patient/caregiver ability to obtain necessary supplies Assess patient/caregiver ability to perform ulcer/skin care regimen upon admission and as needed Assess ulceration(s) every visit Notes: Electronic Signature(s) Signed: 01/08/2022 9:46:19 AM By: Yevonne Pax RN Entered By: Yevonne Pax on 01/08/2022 09:13:09 -------------------------------------------------------------------------------- Pain Assessment Details Patient Name: Date of Service: Cheyenne Gentry DIE M. 01/08/2022 8:45 A M Medical Record Number: [id number] Patient Account Number: [id number] Date of Birth/Sex: Treating RN: Sep 15, 1965 (56 y.o. Cheyenne Gray Primary Care Madalina Rosman: McLean-Scocuzza, French Ana Other Clinician: Referring  Zamantha Strebel: Treating Kentarius Partington/Extender: Chauncey Mann, [name], [name] in Treatment: 0 Active Problems Location of Pain Severity and Description of Pain Patient Has Paino No Site Locations YARITHZA, MINK [place] ([id number]) [id number] Page 7 of 9 Pain Management and Medication Current Pain Management: Electronic Signature(s) Signed: 01/08/2022 9:46:19 AM By: Yevonne Pax RN Entered By: Yevonne Pax on 01/08/2022 08:54:22 -------------------------------------------------------------------------------- Patient/Caregiver Education Details Patient Name: Date of Service: Cheyenne Gentry DIE M. 10/25/2023andnbsp8:45 A M Medical Record Number: [id number] Patient Account Number: [id number] Date of Birth/Gender: Treating RN: 1966-02-25 (56 y.o. Cheyenne Gray Primary Care Physician: McLean-Scocuzza, French Ana Other Clinician: Referring Physician: Treating Physician/Extender: Chauncey Mann, [name], [name] in Treatment: 0 Education Assessment Education Provided To: Patient Education Topics Provided Wound/Skin Impairment: Methods: Explain/Verbal Responses: State content correctly Electronic Signature(s) Signed: 01/08/2022 9:46:19 AM By: Yevonne Pax RN Entered By: Yevonne Pax on 01/08/2022 09:15:59 Cheyenne Gray ([id number]) [id number] Page 8 of 9 -------------------------------------------------------------------------------- Wound Assessment Details Patient Name: Date of Service: Cheyenne Gentry DIE M. 01/08/2022 8:45 A M Medical Record Number:  [id number] Patient Account Number: [id number] Date of Birth/Sex: Treating RN: [birth year] (56 y.o. Cheyenne Gray Primary Care Tadarrius Burch: McLean-Scocuzza, French Ana Other Clinician: Referring Nyilah Kight: Treating Gurpreet Mariani/Extender: RO BSO N, [name], [name] in Treatment: 0 Wound Status Wound Number: 1 Primary Lesion Etiology: Wound  Location: Right, Distal Abdomen - Lower Quadrant Wound Open Wounding Event: Gradually Appeared Status: Date Acquired: 12/15/2021 Comorbid Asthma, Arrhythmia, Congestive Heart Failure, Coronary Artery Weeks Of Treatment: 0 History: Disease, Hypertension, Peripheral Venous Disease Clustered Wound: No Photos Wound Measurements Length: (cm) 1.5 Width: (cm) 2 Depth: (cm) 0.1 Area: (cm) 2.356 Volume: (cm) 0.236 % Reduction in Area: % Reduction in Volume: Epithelialization: None Tunneling: No Undermining: No Wound Description Classification: Full Thickness Without Exposed Suppor Exudate Amount: Medium Exudate Type: Serosanguineous Exudate Color: red, brown t Structures Foul Odor After Cleansing: No Slough/Fibrino Yes Wound Bed Granulation Amount: Medium (34-66%) Exposed Structure Granulation Quality: Red, Pink Fascia Exposed: No Necrotic Amount: Medium (34-66%) Fat Layer (Subcutaneous Tissue) Exposed: Yes Necrotic Quality: Adherent Slough Tendon Exposed: No Muscle Exposed: No Joint Exposed: No Bone Exposed: No Treatment Notes Wound #1 (Abdomen - Lower Quadrant) Wound Laterality: Right, Distal Cleanser Peri-Wound Care Topical Cheyenne Gray, Cheyenne Gray ([id number]) [id number] Page 9 of 9 Primary Dressing Hydrofera Blue Ready Transfer Foam, 2.5x2.5 (in/in) Discharge Instruction: Apply Hydrofera Blue Ready to wound bed as directed Secondary Dressing Coverlet Latex-Free Fabric Adhesive Dressings Discharge Instruction: 1.5 x 2 Secured With Compression Wrap Compression Stockings Add-Ons Electronic Signature(s) Signed: 01/08/2022 9:46:19 AM By: Yevonne Pax RN Entered By: Yevonne Pax on 01/08/2022 09:07:49 -------------------------------------------------------------------------------- Vitals Details Patient Name: Date of Service: Cheyenne Gentry DIE M. 01/08/2022 8:45 A M Medical Record Number: [id number] Patient Account Number: [id number] Date of  Birth/Sex: Treating RN: 1966/03/01 (56 y.o. Cheyenne Gray Primary Care Kell Ferris: McLean-Scocuzza, French Ana Other Clinician: Referring Dacie Mandel: Treating Brighten Buzzelli/Extender: RO BSO N, [name], [name] in Treatment: 0 Vital Signs Time Taken: 08:54 Temperature (F): 98.1 Height (in): 65 Pulse (bpm): 56 Source: Stated Respiratory Rate (breaths/min): 18 Weight (lbs): 270 Blood Pressure (mmHg): 141/87 Source: Stated Reference Range: 80 - 120 mg / dl Body Mass Index (BMI): 44.9 Electronic Signature(s) Signed: 01/08/2022 9:46:19 AM By: Yevonne Pax RN Entered By: Yevonne Pax on 01/08/2022 08:54:52

## 2022-01-08 NOTE — Progress Notes (Signed)
MAKAYLEE, SPIELBERG ([id number]) [id number] Page 1 of 7 Visit Report for 01/08/2022 Chief Complaint Document Details Patient Name: Date of Service: Cheyenne Gray Cheyenne M. 01/08/2022 8:45 A M Medical Record Number: [id number] Patient Account Number: [id number] Date of Birth/Sex: Treating RN: [birth year] (56 y.o. Cheyenne Gray Primary Care Provider: McLean-Scocuzza, French Gray Other Clinician: Referring Provider: Treating Provider/Extender: Chauncey Mann, Cheyenne Gray McLean-Scocuzza, [name] in Treatment: 0 Information Obtained from: Patient Chief Complaint 01/08/2022; patient is here for a review of a wound on her right lateral abdomen Electronic Signature(s) Signed: 01/08/2022 4:38:50 PM By: Cheyenne Najjar MD Entered By: Cheyenne Gray on 01/08/2022 09:21:59 -------------------------------------------------------------------------------- HPI Details Patient Name: Date of Service: Cheyenne Gray Cheyenne M. 01/08/2022 8:45 A M Medical Record Number: [id number] Patient Account Number: [id number] Date of Birth/Sex: Treating RN: 03/17/1966 (57 y.o. Cheyenne Gray Primary Care Provider: McLean-Scocuzza, French Gray Other Clinician: Referring Provider: Treating Provider/Extender: Chauncey Mann, Cheyenne Gray McLean-Scocuzza, [name] in Treatment: 0 History of Present Illness HPI Description: ADMISSION 01/08/2022 This is a 56 year old woman who has been dealing with a wound on her right lateral flank/abdomen for about a month. She was seen in the ER on 11/30/2021 with an open sore I presume the same area. At that time it was draining greenish fluid and was felt to be possibly infected she received a course of the doxycycline without much improvement. She has been using mupirocin topically changing the dressing daily. She tells me that she has been dealing with the consequences of had extensive left lower extremity fracture about a year ago. This is left her with somewhat problems  with mobility. 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 Past medical history is really quite extensive and includes heart failure with preserved ejection fraction, PTSD, low back pain, hypertrophic obstructive cardiomyopathy, status post bariatric surgery greater than 20 years ago and an abdominoplasty about 10 to 12 years ago, psoriasis, iron deficiency, long QT, hypertension and PAD Electronic Signature(s) Cheyenne, Gray ([id number]) [id number] Page 2 of 7 Signed: 01/08/2022 4:38:50 PM By: Cheyenne Najjar MD Entered By: Cheyenne Gray on 01/08/2022 09:37:32 -------------------------------------------------------------------------------- Physical Exam Details Patient Name: Date of Service: Cheyenne Gray Cheyenne M. 01/08/2022 8:45 A M Medical Record Number: [id number] Patient Account Number: [id number] Date of Birth/Sex: Treating RN: Cheyenne Gray (56 y.o. Cheyenne Gray Primary Care Provider: McLean-Scocuzza, French Gray Other Clinician: Referring Provider: Treating Provider/Extender: RO BSO N, Cheyenne Gray McLean-Scocuzza, [name] in Treatment: 0 Constitutional Sitting or standing Blood Pressure is within target range for patient.. Pulse regular and within target range for patient.Marland Kitchen Respirations regular, non-labored and within target range.. Temperature is normal and within the target range for the patient.Marland Kitchen appears in no distress. Respiratory Respiratory effort is easy and symmetric bilaterally. Rate is normal at rest and on room air.. Gastrointestinal (GI) Obese but soft nontender.. Notes Wound exam; the area in question is on the right lateral abdominal flank lower aspect. Small circular wound under illumination the surface of this looks healthy there is a ridge of skin around. There is no obvious infection no periwound tenderness no subcutaneous issues are identified. The wound surface looks slightly dry but otherwise I saw no issues of  concern Electronic Signature(s) Signed: 01/08/2022 4:38:50 PM By: Cheyenne Najjar MD Entered By: Cheyenne Gray on 01/08/2022 09:46:24 -------------------------------------------------------------------------------- Physician Orders Details Patient Name: Date of Service: Cheyenne Gray Cheyenne M. 01/08/2022 8:45 A M Medical Record Number: [id number] Patient Account Number:  [id number] Date of Birth/Sex: Treating RN: [birth year] (56 y.o. Cheyenne Gray Primary Care Provider: McLean-Scocuzza, Olivia Gray Other Clinician: Referring Provider: Treating Provider/Extender: RO BSO Delane Ginger, Cheyenne Gray McLean-Scocuzza, Harless Nakayama in Treatment: 0 Verbal / Phone Orders: No Diagnosis Coding Discharge From [facility] Services Discharge from [facility] Treatment Complete Follow-up Appointments Cheyenne, Gray ([id number]) [id number] Page 3 of 7 Return Appointment in 1 week. Bathing/ Shower/ Hygiene May shower; gently cleanse wound with antibacterial soap, rinse and pat dry prior to dressing wounds Edema Control - Lymphedema / Segmental Compressive Device / Other Elevate, Exercise Daily and A void Standing for Long Periods of Time. Elevate legs to the level of the heart and pump ankles as often as possible Elevate leg(s) parallel to the floor when sitting. Wound Treatment Wound #1 - Abdomen - Lower Quadrant Wound Laterality: Right, Distal Prim Dressing: Hydrofera Blue Ready Transfer Foam, 2.5x2.5 (in/in) 3 x Per Week/30 Days ary Discharge Instructions: Apply Hydrofera Blue Ready to wound bed as directed Secondary Dressing: Coverlet Latex-Free Fabric Adhesive Dressings 3 x Per Week/30 Days Discharge Instructions: 1.5 x 2 Electronic Signature(s) Signed: 01/08/2022 9:46:19 AM By: Cheyenne Coria RN Signed: 01/08/2022 4:38:50 PM By: Cheyenne Ham MD Entered By: Cheyenne Gray on 01/08/2022 09:14:56 -------------------------------------------------------------------------------- Problem  List Details Patient Name: Date of Service: Cheyenne Gray Cheyenne M. 01/08/2022 8:45 A M Medical Record Number: [id number] Patient Account Number: [id number] Date of Birth/Sex: Treating RN: [birth year] (56 y.o. Cheyenne Gray Primary Care Provider: McLean-Scocuzza, Olivia Gray Other Clinician: Referring Provider: Treating Provider/Extender: Eldridge Dace, Cheyenne Gray McLean-Scocuzza, Harless Nakayama in Treatment: 0 Active Problems ICD-10 Encounter Code Description Active Date MDM Diagnosis S31.103D Unspecified open wound of abdominal wall, right lower quadrant without 01/08/2022 No Yes penetration into peritoneal cavity, subsequent encounter E66.8 Other obesity 01/08/2022 No Yes Inactive Problems Resolved Problems Electronic Signature(s) Signed: 01/08/2022 4:38:50 PM By: Cheyenne Ham MD Entered By: Cheyenne Gray on 01/08/2022 09:21:12 Cheyenne Gray ([id number]) [id number] Page 4 of 7 -------------------------------------------------------------------------------- Progress Note Details Patient Name: Date of Service: Cheyenne Gray Cheyenne M. 01/08/2022 8:45 A M Medical Record Number: [id number] Patient Account Number: [id number] Date of Birth/Sex: Treating RN: [birth year] Gray (56 y.o. Cheyenne Gray Primary Care Provider: McLean-Scocuzza, Olivia Gray Other Clinician: Referring Provider: Treating Provider/Extender: Eldridge Dace, Cheyenne Gray McLean-Scocuzza, Harless Nakayama in Treatment: 0 Subjective Chief Complaint Information obtained from Patient 01/08/2022; patient is here for a review of a wound on her right lateral abdomen History of Present Illness (HPI) ADMISSION 01/08/2022 This is a 57 year old woman who has been dealing with a wound on her right lateral flank/abdomen for about a month. She was seen in the ER on 11/30/2021 with an open sore I presume the same area. At that time it was draining greenish fluid and was felt to be possibly infected she received a course of  the doxycycline without much improvement. She has been using mupirocin topically changing the dressing daily. She tells me that she has been dealing with the consequences of had extensive left lower extremity fracture about a year ago. This is left her with somewhat problems with mobility. 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 Past medical history is really quite extensive and includes heart failure with preserved ejection fraction, PTSD, low back pain, hypertrophic obstructive cardiomyopathy, status post bariatric surgery greater than 20 years ago and an abdominoplasty about 10 to 12 years ago, psoriasis, iron deficiency, long QT, hypertension and PAD Patient History Information obtained from  Patient. Allergies COVID-19 (SARS-CoV-2) vaccine, Ad26, penicillin, bee venom protein (honey bee), Effexor Social History Never smoker, Marital Status - Widowed, Alcohol Use - Rarely, Drug Use - No History, Caffeine Use - Daily. Medical History Respiratory Patient has history of Asthma Denies history of Chronic Obstructive Pulmonary Disease (COPD) Cardiovascular Patient has history of Arrhythmia, Congestive Heart Failure, Coronary Artery Disease, Hypertension, Peripheral Venous Disease Review of Systems (ROS) Eyes Complains or has symptoms of Vision Changes. Integumentary (Skin) Complains or has symptoms of Wounds. Objective Constitutional Sitting or standing Blood Pressure is within target range for patient.. Pulse regular and within target range for patient.Marland Kitchen Respirations regular, non-labored and within target range.. Temperature is normal and within the target range for the patient.Marland Kitchen appears in no distress. Vitals Time Taken: 8:54 AM, Height: 65 in, Source: Stated, Weight: 270 lbs, Source: Stated, BMI: 44.9, Temperature: 98.1 F, Pulse: 56 bpm, Respiratory Rate: 18 breaths/min, Blood Pressure: 141/87 mmHg. Respiratory Cheyenne, Gray ([id number])  [id number] Page 5 of 7 Respiratory effort is easy and symmetric bilaterally. Rate is normal at rest and on room air.. Gastrointestinal (GI) Obese but soft nontender.. General Notes: Wound exam; the area in question is on the right lateral abdominal flank lower aspect. Small circular wound under illumination the surface of this looks healthy there is a ridge of skin around. There is no obvious infection no periwound tenderness no subcutaneous issues are identified. The wound surface looks slightly dry but otherwise I saw no issues of concern Integumentary (Hair, Skin) Wound #1 status is Open. Original cause of wound was Gradually Appeared. The date acquired was: 12/15/2021. The wound is located on the Right,Distal Abdomen - Lower Quadrant. The wound measures 1.5cm length x 2cm width x 0.1cm depth; 2.356cm^2 area and 0.236cm^3 volume. There is Fat Layer (Subcutaneous Tissue) exposed. There is no tunneling or undermining noted. There is a medium amount of serosanguineous drainage noted. There is medium (34-66%) red, pink granulation within the wound bed. There is a medium (34-66%) amount of necrotic tissue within the wound bed including Adherent Slough. Assessment Active Problems ICD-10 Unspecified open wound of abdominal wall, right lower quadrant without penetration into peritoneal cavity, subsequent encounter Other obesity Plan Discharge From [facility] Services: Discharge from [facility] Treatment Complete Follow-up Appointments: Return Appointment in 1 week. Bathing/ Shower/ Hygiene: May shower; gently cleanse wound with antibacterial soap, rinse and pat dry prior to dressing wounds Edema Control - Lymphedema / Segmental Compressive Device / Other: Elevate, Exercise Daily and Avoid Standing for Long Periods of Time. Elevate legs to the level of the heart and pump ankles as often as possible Elevate leg(s) parallel to the floor when sitting. WOUND #1: -  Abdomen - Lower Quadrant Wound Laterality: Right, Distal Prim Dressing: Hydrofera Blue Ready Transfer Foam, 2.5x2.5 (in/in) 3 x Per Week/30 Days ary Discharge Instructions: Apply Hydrofera Blue Ready to wound bed as directed Secondary Dressing: Coverlet Latex-Free Fabric Adhesive Dressings 3 x Per Week/30 Days Discharge Instructions: 1.5 x 2 1. I suspect her abdominal wound is related to trauma perhaps from the waistband of her pants, sides of her potty chair she is using perhaps sides of her recliner. I asked her to be careful about this in all aspects. We will use Hydrofera Blue and border foam change every 1 to 2 days. 2. She was concerned that this was recurrent from an incision for her panniculectomy she had 10 to 12 years ago actually the wound appears to be under the scar and not involving the  scar. 3. Absolutely no concerning evidence to suggest infection at this point no cultures no further antibiotics. No evidence of an abscess and no evidence of deep subcutaneous involvement Electronic Signature(s) Signed: 01/08/2022 4:38:50 PM By: Cheyenne Najjar MD Entered By: Cheyenne Gray on 01/08/2022 09:48:08 -------------------------------------------------------------------------------- ROS/PFSH Details Patient Name: Date of Service: Cheyenne Gray Cheyenne M. 01/08/2022 8:45 A M Medical Record Number: [id number] Patient Account Number: [id number] Cheyenne, Gray ([id number]) [id number] Page 6 of 7 Date of Birth/Sex: Treating RN: [birth year] (56 y.o. Cheyenne Gray Primary Care Provider: McLean-Scocuzza, French Gray Other Clinician: Referring Provider: Treating Provider/Extender: RO BSO Dorris Carnes, Cheyenne Gray McLean-Scocuzza, [name] in Treatment: 0 Information Obtained From Patient Eyes Complaints and Symptoms: Positive for: Vision Changes Integumentary (Skin) Complaints and Symptoms: Positive for: Wounds Respiratory Medical History: Positive for:  Asthma Negative for: Chronic Obstructive Pulmonary Disease (COPD) Cardiovascular Medical History: Positive for: Arrhythmia; Congestive Heart Failure; Coronary Artery Disease; Hypertension; Peripheral Venous Disease Immunizations Pneumococcal Vaccine: Received Pneumococcal Vaccination: No Implantable Devices None Family and Social History Never smoker; Marital Status - Widowed; Alcohol Use: Rarely; Drug Use: No History; Caffeine Use: Daily Electronic Signature(s) Signed: 01/08/2022 9:46:19 AM By: Yevonne Pax RN Signed: 01/08/2022 4:38:50 PM By: Cheyenne Najjar MD Entered By: Yevonne Pax on 01/08/2022 08:57:50 -------------------------------------------------------------------------------- SuperBill Details Patient Name: Date of Service: Cheyenne Gray Cheyenne M. 01/08/2022 Medical Record Number: [id number] Patient Account Number: [id number] Date of Birth/Sex: Treating RN: [birth year] Gray (56 y.o. Cheyenne Gray Primary Care Provider: McLean-Scocuzza, French Gray Other Clinician: Referring Provider: Treating Provider/Extender: Chauncey Mann, Cheyenne Gray McLean-Scocuzza, [name] in Treatment: 0 Diagnosis Coding ICD-10 Codes Code Description S31.103D Unspecified open wound of abdominal wall, right lower quadrant without penetration into peritoneal cavity, subsequent encounter E66.8 Other obesity Cheyenne, Gray ([id number]) [id number] Page 7 of 7 Facility Procedures : CPT4 Code: 86168372 Description: 99214 - WOUND CARE VISIT-LEV 4 EST PT Modifier: Quantity: 1 Electronic Signature(s) Signed: 01/08/2022 4:38:50 PM By: Cheyenne Najjar MD Previous Signature: 01/08/2022 9:46:19 AM Version By: Yevonne Pax RN Entered By: Cheyenne Gray on 01/08/2022 09:48:40

## 2022-01-08 NOTE — Progress Notes (Signed)
**Note Cheyenne-Identified via Obfuscation** [name], [name] ([id number]) [id number] Page 1 of 5 Visit Report for 01/08/2022 Abuse Risk Screen Details Patient Name: Date of Service: Cheyenne Gray Gray M. 01/08/2022 8:45 A M Medical Record Number: [id number] Patient Account Number: [id number] Date of Birth/Sex: Treating RN: June 13, 1965 (56 y.o. [name] Primary Care Corbet Hanley: McLean-Scocuzza, French Ana Other Clinician: Referring Charmane Protzman: Treating Lilley Hubble/Extender: Chauncey Mann, [name], [name] in Treatment: 0 Abuse Risk Screen Items Answer ABUSE RISK SCREEN: Has anyone close to you tried to hurt or harm you recentlyo No Do you feel uncomfortable with anyone in your familyo No Has anyone forced you do things that you didnt want to doo No Electronic Signature(s) Signed: 01/08/2022 9:46:19 AM By: Yevonne Pax RN Entered By: Yevonne Pax on 01/08/2022 08:57:58 -------------------------------------------------------------------------------- Activities of Daily Living Details Patient Name: Date of Service: Cheyenne Gray Gray M. 01/08/2022 8:45 A M Medical Record Number: [id number] Patient Account Number: [id number] Date of Birth/Sex: Treating RN: 04-Apr-1965 (56 y.o. [name] Primary Care Alam Guterrez: McLean-Scocuzza, French Ana Other Clinician: Referring Fusaye Wachtel: Treating Jennifermarie Franzen/Extender: RO BSO Dorris Carnes, [name], [name] in Treatment: 0 Activities of Daily Living Items Answer Activities of Daily Living (Please select one for each item) Drive Automobile Completely Able T Medications ake Completely Able Use T elephone Completely Able Care for Appearance Completely Able Use T oilet Completely Able Bath / Shower Completely Able Dress Self Completely Able Feed Self Completely Able Walk Completely Able Get In / Out Bed Completely Able Housework Completely Cheyenne Gray, Cheyenne Gray ([id number]) [phone] [id number] Page 2 of  5 Prepare Meals Completely Able Handle Money Completely Able Shop for Self Completely Able Electronic Signature(s) Signed: 01/08/2022 9:46:19 AM By: Yevonne Pax RN Entered By: Yevonne Pax on 01/08/2022 08:58:19 -------------------------------------------------------------------------------- Education Screening Details Patient Name: Date of Service: Cheyenne Gray Gray M. 01/08/2022 8:45 A M Medical Record Number: [id number] Patient Account Number: [id number] Date of Birth/Sex: Treating RN: 03-24-1965 (56 y.o. [name] Primary Care Javoni Lucken: McLean-Scocuzza, French Ana Other Clinician: Referring Yarethzy Croak: Treating Sharaine Delange/Extender: Chauncey Mann, [name], [name] in Treatment: 0 Primary Learner Assessed: Patient Learning Preferences/Education Level/Primary Language Learning Preference: Explanation Highest Education Level: College or Above Preferred Language: English Cognitive Barrier Language Barrier: No Translator Needed: No Memory Deficit: No Emotional Barrier: No Cultural/Religious Beliefs Affecting Medical Care: No Physical Barrier Impaired Vision: Yes Glasses Impaired Hearing: No Decreased Hand dexterity: No Knowledge/Comprehension Knowledge Level: Medium Comprehension Level: High Ability to understand written instructions: High Ability to understand verbal instructions: High Motivation Anxiety Level: Anxious Cooperation: Cooperative Education Importance: Acknowledges Need Interest in Health Problems: Asks Questions Perception: Coherent Willingness to Engage in Self-Management High Activities: Readiness to Engage in Self-Management High Activities: Electronic Signature(s) Signed: 01/08/2022 9:46:19 AM By: Yevonne Pax RN Entered By: Yevonne Pax on 01/08/2022 08:58:54 [name] ([id number]) [id number] Page 3 of 5 -------------------------------------------------------------------------------- Fall  Risk Assessment Details Patient Name: Date of Service: Cheyenne Gray Gray M. 01/08/2022 8:45 A M Medical Record Number: [id number] Patient Account Number: [id number] Date of Birth/Sex: Treating RN: [birth year] (56 y.o. [name] Primary Care Payton Prinsen: McLean-Scocuzza, French Ana Other Clinician: Referring Ayah Cozzolino: Treating Blase Beckner/Extender: Chauncey Mann, [name], [name] in Treatment: 0 Fall Risk Assessment Items Have you had 2 or more falls in the last 12 monthso 0 Yes Have you had any fall that resulted in injury in the last 12 monthso 0 Yes FALLS RISK SCREEN History of  falling - immediate or within 3 months 25 Yes Secondary diagnosis (Do you have 2 or more medical diagnoseso) 15 Yes Ambulatory aid None/bed rest/wheelchair/nurse 0 No Crutches/cane/walker 15 Yes Furniture 0 No Intravenous therapy Access/Saline/Heparin Lock 0 No Gait/Transferring Normal/ bed rest/ wheelchair 0 No Weak (short steps with or without shuffle, stooped but able to lift head while walking, may seek 0 No support from furniture) Impaired (short steps with shuffle, may have difficulty arising from chair, head down, impaired 0 No balance) Mental Status Oriented to own ability 0 Yes Electronic Signature(s) Signed: 01/08/2022 9:46:19 AM By: Carlene Coria RN Entered By: Carlene Coria on 01/08/2022 08:59:28 -------------------------------------------------------------------------------- Foot Assessment Details Patient Name: Date of Service: Cheyenne Gray Gray M. 01/08/2022 8:45 A M Medical Record Number: [id number] Patient Account Number: [id number] Date of Birth/Sex: Treating RN: [birth year] (56 y.o. Orvan Falconer Primary Care Lygia Olaes: McLean-Scocuzza, Olivia Mackie Other Clinician: Referring Alanah Sakuma: Treating Paddy Walthall/Extender: Eldridge Dace, [name], Harless Nakayama in Treatment: 0 Foot Assessment Items Site Locations [place] ([id number]) [id number]  [id number] Page 4 of 5 + = Sensation present, - = Sensation absent, C = Callus, U = Ulcer R = Redness, W = Warmth, M = Maceration, PU = Pre-ulcerative lesion F = Fissure, S = Swelling, D = Dryness Assessment Right: Left: Other Deformity: No No Prior Foot Ulcer: No No Prior Amputation: No No Charcot Joint: No No Ambulatory Status: Ambulatory Without Help Gait: Steady Electronic Signature(s) Signed: 01/08/2022 9:46:19 AM By: Carlene Coria RN Entered By: Carlene Coria on 01/08/2022 09:00:13 -------------------------------------------------------------------------------- Nutrition Risk Screening Details Patient Name: Date of Service: Cheyenne Gray Gray M. 01/08/2022 8:45 A M Medical Record Number: [id number] Patient Account Number: [id number] Date of Birth/Sex: Treating RN: April 13, 1965 (56 y.o. Orvan Falconer Primary Care Ira Busbin: McLean-Scocuzza, Olivia Mackie Other Clinician: Referring Simone Tuckey: Treating Yuniel Blaney/Extender: RO BSO N, [name], Harless Nakayama in Treatment: 0 Height (in): 65 Weight (lbs): 270 Body Mass Index (BMI): 44.9 Nutrition Risk Screening Items Score Screening NUTRITION RISK SCREEN: I have an illness or condition that made me change the kind and/or amount of food I eat 0 No I eat fewer than two meals per day 0 No I eat few fruits and vegetables, or milk products 0 No I have three or more drinks of beer, liquor or wine almost every day 0 No I have tooth or mouth problems that make it hard for me to eat 0 No I don't always have enough money to buy the food I need 0 No Cheyenne Gray, Cheyenne Gray ([id number]) [phone] [id number] Page 5 of 5 I eat alone most of the time 0 No I take three or more different prescribed or over-the-counter drugs a day 1 Yes Without wanting to, I have lost or gained 10 pounds in the last six months 0 No I am not always physically able to shop, cook and/or feed myself 0 No Nutrition Protocols Good Risk  Protocol 0 No interventions needed Moderate Risk Protocol High Risk Proctocol Risk Level: Good Risk Score: 1 Electronic Signature(s) Signed: 01/08/2022 9:46:19 AM By: Carlene Coria RN Entered By: Carlene Coria on 01/08/2022 09:00:05

## 2022-01-15 ENCOUNTER — Encounter: Payer: Medicare Other | Attending: Internal Medicine | Admitting: Internal Medicine

## 2022-01-15 DIAGNOSIS — S31103D Unspecified open wound of abdominal wall, right lower quadrant without penetration into peritoneal cavity, subsequent encounter: Secondary | ICD-10-CM | POA: Diagnosis not present

## 2022-01-15 DIAGNOSIS — X58XXXD Exposure to other specified factors, subsequent encounter: Secondary | ICD-10-CM | POA: Insufficient documentation

## 2022-01-15 DIAGNOSIS — I421 Obstructive hypertrophic cardiomyopathy: Secondary | ICD-10-CM | POA: Insufficient documentation

## 2022-01-15 DIAGNOSIS — E668 Other obesity: Secondary | ICD-10-CM | POA: Diagnosis not present

## 2022-01-15 DIAGNOSIS — I4581 Long QT syndrome: Secondary | ICD-10-CM | POA: Insufficient documentation

## 2022-01-15 DIAGNOSIS — Z6841 Body Mass Index (BMI) 40.0 and over, adult: Secondary | ICD-10-CM | POA: Diagnosis not present

## 2022-01-15 DIAGNOSIS — F4310 Post-traumatic stress disorder, unspecified: Secondary | ICD-10-CM | POA: Diagnosis not present

## 2022-01-15 DIAGNOSIS — I739 Peripheral vascular disease, unspecified: Secondary | ICD-10-CM | POA: Diagnosis not present

## 2022-01-15 DIAGNOSIS — E611 Iron deficiency: Secondary | ICD-10-CM | POA: Diagnosis not present

## 2022-01-15 DIAGNOSIS — I110 Hypertensive heart disease with heart failure: Secondary | ICD-10-CM | POA: Diagnosis not present

## 2022-01-15 DIAGNOSIS — Z9884 Bariatric surgery status: Secondary | ICD-10-CM | POA: Insufficient documentation

## 2022-01-15 DIAGNOSIS — L409 Psoriasis, unspecified: Secondary | ICD-10-CM | POA: Diagnosis not present

## 2022-01-15 DIAGNOSIS — I5032 Chronic diastolic (congestive) heart failure: Secondary | ICD-10-CM | POA: Insufficient documentation

## 2022-01-15 DIAGNOSIS — M545 Low back pain, unspecified: Secondary | ICD-10-CM | POA: Insufficient documentation

## 2022-01-15 DIAGNOSIS — S31103A Unspecified open wound of abdominal wall, right lower quadrant without penetration into peritoneal cavity, initial encounter: Secondary | ICD-10-CM | POA: Diagnosis not present

## 2022-01-16 NOTE — Progress Notes (Signed)
KARIE, SKOWRON ([id number]) [id number] Page 1 of 5 Visit Report for 01/15/2022 HPI Details Patient Name: Date of Service: Cheyenne Gentry DIE M. 01/15/2022 1:15 PM Medical Record Number: [id number] Patient Account Number: [id number] Date of Birth/Sex: Treating RN: 01/24/1966 (56 y.o. [name] Primary Care Provider: McLean-Scocuzza, French Ana Other Clinician: Referring Provider: Treating Provider/Extender: Chauncey Mann, [name], [name] in Treatment: 1 History of Present Illness HPI Description: ADMISSION 01/08/2022 This is a 56 year old woman who has been dealing with a wound on her right lateral flank/abdomen for about a month. She was seen in the ER on 11/30/2021 with an open sore I presume the same area. At that time it was draining greenish fluid and was felt to be possibly infected she received a course of the doxycycline without much improvement. She has been using mupirocin topically changing the dressing daily. She tells me that she has been dealing with the consequences of had extensive left lower extremity fracture about a year ago. This is left her with somewhat problems with mobility. 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 01/15/2022 1:15 PM Medical Record Number: [id number] Patient Account Number: [id number] Date of Birth/Sex: Treating RN: 09-23-1965 (56 y.o. [name] Primary Care Provider: McLean-Scocuzza, French Ana Other Clinician: Referring Provider: Treating Provider/Extender: RO BSO Dorris Carnes, [name], [name] in Treatment: 1 Constitutional Patient is hypertensive.. Pulse regular and within target range for patient.Marland Kitchen Respirations regular, non-labored and within target range.. Temperature is normal and within the target range for the patient.Marland Kitchen appears in no distress. Notes Cheyenne Gray, Cheyenne Gray ([id number]) [id number] Page 2 of 5 Wound exam; right lateral flank. Under illumination healthy granulated surface with rims of epithelialization. Much improved versus last week. No surrounding infection Electronic Signature(s) Signed: 01/15/2022 3:59:15 PM By: Baltazar Najjar MD Entered By: Baltazar Najjar on 01/15/2022 13:50:40 -------------------------------------------------------------------------------- Physician Orders Details Patient Name: Date of Service: Cheyenne Gentry DIE M. 01/15/2022 1:15 PM Medical Record Number: [id number] Patient Account Number: [id number] Date of Birth/Sex: Treating RN: [birth year] (56 y.o. [name] Primary Care Provider: McLean-Scocuzza, French Ana Other Clinician: Referring Provider: Treating Provider/Extender: RO BSO Dorris Carnes, [name], [name] in Treatment: 1 Verbal / Phone Orders: No Diagnosis Coding Discharge From [facility] Services Discharge from Wound Care Center Treatment Complete Follow-up Appointments Return Appointment in 1 week. Bathing/ Shower/ Hygiene May shower; gently cleanse wound with antibacterial soap, rinse and pat dry prior to dressing wounds Edema Control - Lymphedema / Segmental Compressive Device / Other Elevate, Exercise Daily  and A void Standing for Long Periods of Time. Elevate legs to the level of the heart and pump ankles as often as possible Elevate leg(s) parallel to the floor when sitting. Wound Treatment Wound #1 - Abdomen - Lower Quadrant Wound Laterality: Right, Distal Prim Dressing: Hydrofera Blue Ready Transfer Foam, 2.5x2.5 (in/in) 3  x Per Week/30 Days ary Discharge Instructions: Apply Hydrofera Blue Ready to wound bed as directed Secondary Dressing: Coverlet Latex-Free Fabric Adhesive Dressings 3 x Per Week/30 Days Discharge Instructions: 1.5 x 2 Electronic Signature(s) Signed: 01/15/2022 3:59:15 PM By: Baltazar Najjar MD Signed: 01/16/2022 8:11:11 AM By: Yevonne Pax RN Entered By: Yevonne Pax on 01/15/2022 13:46:36 [name] ([id number]) [id number] Page 3 of 5 -------------------------------------------------------------------------------- Problem List Details Patient Name: Date of Service: Cheyenne Gentry DIE M. 01/15/2022 1:15 PM Medical Record Number: [id number] Patient Account Number: [id number] Date of Birth/Sex: Treating RN: 12-19-1965 (56 y.o. [name] Primary Care Provider: McLean-Scocuzza, French Ana Other Clinician: Referring Provider: Treating Provider/Extender: Chauncey Mann, [name], [name] in Treatment: 1 Active Problems ICD-10 Encounter Code Description Active Date MDM Diagnosis S31.103D Unspecified open wound of abdominal wall, right lower quadrant without 01/08/2022 No Yes penetration into peritoneal cavity, subsequent encounter E66.8 Other obesity 01/08/2022 No Yes Inactive Problems Resolved Problems Electronic Signature(s) Signed: 01/15/2022 3:59:15 PM By: Baltazar Najjar MD Entered By: Baltazar Najjar on 01/15/2022 13:48:52 -------------------------------------------------------------------------------- Progress Note Details Patient Name: Date of Service: Cheyenne Gentry DIE M. 01/15/2022 1:15 PM Medical Record  Number: [id number] Patient Account Number: [id number] Date of Birth/Sex: Treating RN: 30-Dec-1965 (57 y.o. [name] Primary Care Provider: McLean-Scocuzza, French Ana Other Clinician: Referring Provider: Treating Provider/Extender: Chauncey Mann, [name], [name] in Treatment: 1 Subjective History of Present Illness (HPI) ADMISSION 01/08/2022 This is a 56 year old woman who has been dealing with a wound on her right lateral flank/abdomen for about a month. She was seen in the ER on 11/30/2021 with an open sore I presume the same area. At that time it was draining greenish fluid and was felt to be possibly infected she received a course of the doxycycline without much improvement. She has been using mupirocin topically changing the dressing daily. She tells me that she has been dealing with the consequences of had extensive left lower extremity fracture about a year ago. This is left her with somewhat problems with mobility. 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 Pulse regular and within target range for patient.Marland Kitchen Respirations regular, non-labored and within target range.. Temperature is normal and within the target range for the patient.Marland Kitchen appears in no distress. Vitals Time Taken: 1:27 PM, Height: 65 in, Weight: 270 lbs, BMI: 44.9, Temperature: 98.2 F,  Pulse: 53 bpm, Respiratory Rate: 18 breaths/min, Blood Pressure: 155/81 mmHg. General Notes: Wound exam; right lateral flank. Under illumination healthy granulated surface with rims of epithelialization. Much improved versus last week. No surrounding infection Integumentary (Hair, Skin) Wound #1 status is Open. Original cause of wound was Gradually Appeared. The date acquired was: 12/15/2021. The wound has been in treatment 1 weeks. The wound is located on the Right,Distal Abdomen - Lower Quadrant. The wound measures 0.7cm length x 1.5cm width x 0.1cm depth; 0.825cm^2 area and 0.082cm^3 volume. There is Fat Layer (Subcutaneous Tissue) exposed. There is  no tunneling or undermining noted. There is a medium amount of serosanguineous drainage noted. There is medium (34-66%) red, pink granulation within the wound bed. There is a medium (34-66%) amount of necrotic tissue within the wound bed including Adherent Slough. Assessment Active Problems ICD-10 Unspecified open wound of abdominal wall, right lower quadrant without penetration into peritoneal cavity, subsequent encounter Other obesity Plan Discharge From [facility] Services: Discharge from [place] Treatment Complete Follow-up Appointments: Return Appointment in 1 week. Bathing/ Shower/ Hygiene: May shower; gently cleanse wound with antibacterial soap, rinse and pat dry prior to dressing wounds Edema Control - Lymphedema / Segmental Compressive Device / Other: Elevate, Exercise Daily and Avoid Standing for Long Periods of Time. Elevate legs to the level of the heart and pump ankles as often as possible Elevate leg(s) parallel to the floor when sitting. WOUND #1: - Abdomen - Lower Quadrant Wound Laterality: Right, Distal Prim Dressing: Hydrofera Blue Ready Transfer Foam, 2.5x2.5 (in/in) 3 x Per Week/30 Days ary Discharge Instructions: Apply Hydrofera Blue Ready to wound bed as directed Secondary Dressing: Coverlet Latex-Free Fabric  Adhesive Dressings 3 x Per Week/30 Days Discharge Instructions: 1.5 x 2 1. We continued with Hydrofera Blue with adherent dressing 2. The wound is considerably smaller and she is watching for any possible pressure scenarios Electronic Signature(s) Signed: 01/15/2022 3:59:15 PM By: Linton Ham MD Entered By: Linton Ham on 01/15/2022 13:52:28 Cheyenne Gray ([id number]) [id number] Page 5 of 5 -------------------------------------------------------------------------------- SuperBill Details Patient Name: Date of Service: Cheyenne Gray DIE M. 01/15/2022 Medical Record Number: [id number] Patient Account Number: [id number] Date of Birth/Sex: Treating RN: [birth year] (56 y.o. Orvan Falconer Primary Care Provider: McLean-Scocuzza, Olivia Mackie Other Clinician: Referring Provider: Treating Provider/Extender: Eldridge Dace, [name], Harless Nakayama in Treatment: 1 Diagnosis Coding ICD-10 Codes Code Description S31.103D Unspecified open wound of abdominal wall, right lower quadrant without penetration into peritoneal cavity, subsequent encounter E66.8 Other obesity Facility Procedures : CPT4 Code: [id number] Description: 56314 - WOUND CARE VISIT-LEV 2 EST PT Modifier: Quantity: 1 Physician Procedures : CPT4 Code Description Modifier [id number] - WC PHYS LEVEL 2 - EST PT ICD-10 Diagnosis Description S31.103D Unspecified open wound of abdominal wall, right lower quadrant without penetration into peritoneal cavi subsequent encounter E66.8 Other  obesity Quantity: 1 ty, Electronic Signature(s) Signed: 01/15/2022 3:59:15 PM By: Linton Ham MD Entered By: Linton Ham on 01/15/2022 13:52:41

## 2022-01-16 NOTE — Progress Notes (Signed)
SUZETTE, FLAGLER ([id number]) [id number] Page 1 of 8 Visit Report for 01/15/2022 Arrival Information Details Patient Name: Date of Service: Cheyenne Gray DIE M. 01/15/2022 1:15 PM Medical Record Number: [id number] Patient Account Number: [id number] Date of Birth/Sex: Treating RN: [birth year] (56 y.o. [name] Primary Care Kennan Detter: McLean-Scocuzza, French Ana Other Clinician: Referring Leather Estis: Treating Aylin Rhoads/Extender: Chauncey Mann, [name], [name] in Treatment: 1 Visit Information History Since Last Visit All ordered tests and consults were completed: No Patient Arrived: Ambulatory Added or deleted any medications: No Arrival Time: 13:22 Any new allergies or adverse reactions: No Accompanied By: self Had a fall or experienced change in No Transfer Assistance: None activities of daily living that may affect Patient Identification Verified: Yes risk of falls: Secondary Verification Process Completed: Yes Signs or symptoms of abuse/neglect since last visito No Patient Requires Transmission-Based Precautions: No Hospitalized since last visit: No Patient Has Alerts: No Implantable device outside of the clinic excluding No cellular tissue based products placed in the center since last visit: Has Dressing in Place as Prescribed: Yes Pain Present Now: No Electronic Signature(s) Signed: 01/16/2022 8:11:11 AM By: Yevonne Pax RN Entered By: Yevonne Pax on 01/15/2022 13:27:43 -------------------------------------------------------------------------------- Clinic Level of Care Assessment Details Patient Name: Date of Service: Cheyenne Gray DIE M. 01/15/2022 1:15 PM Medical Record Number: [id number] Patient Account Number: [id number] Date of Birth/Sex: Treating RN: [birth year] (56 y.o. [name] Primary Care Zakee Deerman: McLean-Scocuzza, French Ana Other Clinician: Referring Tyshea Imel: Treating Jahmiya Guidotti/Extender: RO BSO N, MICHA EL  G McLean-Scocuzza, [name] in Treatment: 1 Clinic Level of Care Assessment Items TOOL 4 Quantity Score X- 1 0 Use when only an EandM is performed on FOLLOW-UP visit ASSESSMENTS - Nursing Assessment / Reassessment X- 1 10 Reassessment of Co-morbidities (includes updates in patient status) X- 1 5 Reassessment of Adherence to Treatment Plan Cheyenne Gray, Cheyenne Gray ([id number]) [id number] Page 2 of 8 ASSESSMENTS - Wound and Skin A ssessment / Reassessment X - Simple Wound Assessment / Reassessment - one wound 1 5 []  - 0 Complex Wound Assessment / Reassessment - multiple wounds []  - 0 Dermatologic / Skin Assessment (not related to wound area) ASSESSMENTS - Focused Assessment []  - 0 Circumferential Edema Measurements - multi extremities []  - 0 Nutritional Assessment / Counseling / Intervention []  - 0 Lower Extremity Assessment (monofilament, tuning fork, pulses) []  - 0 Peripheral Arterial Disease Assessment (using hand held doppler) ASSESSMENTS - Ostomy and/or Continence Assessment and Care []  - 0 Incontinence Assessment and Management []  - 0 Ostomy Care Assessment and Management (repouching, etc.) PROCESS - Coordination of Care X - Simple Patient / Family Education for ongoing care 1 15 []  - 0 Complex (extensive) Patient / Family Education for ongoing care []  - 0 Staff obtains , Records, T Results / Process Orders est []  - 0 Staff telephones HHA, Nursing Homes / Clarify orders / etc []  - 0 Routine Transfer to another Facility (non-emergent condition) []  - 0 Routine Hospital Admission (non-emergent condition) []  - 0 New Admissions / / Ordering NPWT Apligraf, etc. , []  - 0 Emergency Hospital Admission (emergent condition) X- 1 10 Simple Discharge Coordination []  - 0 Complex (extensive) Discharge Coordination PROCESS - Special Needs []  - 0 Pediatric / Minor Patient Management []  - 0 Isolation Patient  Management []  - 0 Hearing / Language / Visual special needs []  - 0 Assessment of Community assistance (transportation, D/C planning, etc.) []  - 0 Additional assistance / Altered mentation []  -  0 Support Surface(s) Assessment (bed, cushion, seat, etc.) INTERVENTIONS - Wound Cleansing / Measurement X - Simple Wound Cleansing - one wound 1 5 []  - 0 Complex Wound Cleansing - multiple wounds X- 1 5 Wound Imaging (photographs - any number of wounds) []  - 0 Wound Tracing (instead of photographs) X- 1 5 Simple Wound Measurement - one wound []  - 0 Complex Wound Measurement - multiple wounds INTERVENTIONS - Wound Dressings X - Small Wound Dressing one or multiple wounds 1 10 []  - 0 Medium Wound Dressing one or multiple wounds []  - 0 Large Wound Dressing one or multiple wounds []  - 0 Application of Medications - topical []  - 0 Application of Medications - injection INTERVENTIONS - Miscellaneous []  - 0 External ear exam Cheyenne Gray, Cheyenne Gray ([id number]) [id number] Page 3 of 8 []  - 0 Specimen Collection (cultures, biopsies, blood, body fluids, etc.) []  - 0 Specimen(s) / Culture(s) sent or taken to Lab for analysis []  - 0 Patient Transfer (multiple staff / Harrel Lemon Lift / Similar devices) []  - 0 Simple Staple / Suture removal (25 or less) []  - 0 Complex Staple / Suture removal (26 or more) []  - 0 Hypo / Hyperglycemic Management (close monitor of Blood Glucose) []  - 0 Ankle / Brachial Index (ABI) - do not check if billed separately X- 1 5 Vital Signs Has the patient been seen at the hospital within the last three years: Yes Total Score: 75 Level Of Care: New/Established - Level 2 Electronic Signature(s) Signed: 01/16/2022 8:11:11 AM By: Carlene Coria RN Entered By: Carlene Coria on 01/15/2022 13:47:09 -------------------------------------------------------------------------------- Encounter Discharge Information Details Patient Name: Date of Service: Cheyenne Gray DIE M. 01/15/2022 1:15 PM Medical Record Number: [id number] Patient Account Number: [id number] Date of Birth/Sex: Treating RN: 25-Jun-1965 (56 y.o. Orvan Falconer Primary Care Suanne Minahan: McLean-Scocuzza, Olivia Mackie Other Clinician: Referring Irasema Chalk: Treating Marzelle Rutten/Extender: Eldridge Dace, [name], Harless Nakayama in Treatment: 1 Encounter Discharge Information Items Discharge Condition: Stable Ambulatory Status: Ambulatory Discharge Destination: Home Transportation: Private Auto Accompanied By: self Schedule Follow-up Appointment: Yes Clinical Summary of Care: Electronic Signature(s) Signed: 01/16/2022 8:11:11 AM By: Carlene Coria RN Entered By: Carlene Coria on 01/15/2022 13:50:47 Lower Extremity Assessment Details -------------------------------------------------------------------------------- Cheyenne Gray ([id number]) [id number] Page 4 of 8 Patient Name: Date of Service: Cheyenne Gray DIE M. 01/15/2022 1:15 PM Medical Record Number: [id number] Patient Account Number: [id number] Date of Birth/Sex: Treating RN: 1965-12-09 (56 y.o. Orvan Falconer Primary Care Mcdonald Reiling: McLean-Scocuzza, Olivia Mackie Other Clinician: Referring Ganon Demasi: Treating Maximina Pirozzi/Extender: Eldridge Dace, [name], Harless Nakayama in Treatment: 1 Electronic Signature(s) Signed: 01/16/2022 8:11:11 AM By: Carlene Coria RN Entered By: Carlene Coria on 01/15/2022 13:31:58 -------------------------------------------------------------------------------- Multi Wound Chart Details Patient Name: Date of Service: Cheyenne Gray DIE M. 01/15/2022 1:15 PM Medical Record Number: [id number] Patient Account Number: [id number] Date of Birth/Sex: Treating RN: December 22, 1965 (56 y.o. Orvan Falconer Primary Care Blyss Lugar: McLean-Scocuzza, Olivia Mackie Other Clinician: Referring Wilfrido Luedke: Treating Hasten Sweitzer/Extender: RO BSO N, [name], Harless Nakayama in Treatment: 1 Vital  Signs Height(in): 65 Pulse(bpm): 76 Weight(lbs): 270 Blood Pressure(mmHg): 155/81 Body Mass Index(BMI): 44.9 Temperature(F): 98.2 Respiratory Rate(breaths/min): 18 [1:Photos:] [N/A:N/A] Right, Distal Abdomen - Lower N/A N/A Wound Location: Quadrant Gradually Appeared N/A N/A Wounding Event: Lesion N/A N/A Primary Etiology: Asthma, Arrhythmia, Congestive Heart N/A N/A Comorbid History: Failure, Coronary Artery Disease, Hypertension, Peripheral Venous Disease 12/15/2021 N/A N/A Date Acquired: 1 N/A N/A Weeks of Treatment: Open N/A N/A Wound  Status: No N/A N/A Wound Recurrence: 0.7x1.5x0.1 N/A N/A Measurements L x W x D (cm) 0.825 N/A N/A A (cm) : rea 0.082 N/A N/A Volume (cm) : 65.00% N/A N/A % Reduction in Area: 65.30% N/A N/A % Reduction in Volume: Full Thickness Without Exposed N/A N/A Classification: Support Structures Medium N/A N/A Exudate Amount: Serosanguineous N/A N/A Exudate Type: red, brown N/A N/A Exudate Color: Medium (34-66%) N/A N/A Granulation Amount: Red, Pink N/A N/A Granulation Quality: Medium (34-66%) N/A N/A Necrotic Amount: Cheyenne Gray, Cheyenne Gray ([id number]) [id number] Page 5 of 8 Fat Layer (Subcutaneous Tissue): Yes N/A N/A Exposed Structures: Fascia: No Tendon: No Muscle: No Joint: No Bone: No None N/A N/A Epithelialization: Treatment Notes Electronic Signature(s) Signed: 01/16/2022 8:11:11 AM By: Yevonne Pax RN Entered By: Yevonne Pax on 01/15/2022 13:32:13 -------------------------------------------------------------------------------- Multi-Disciplinary Care Plan Details Patient Name: Date of Service: Cheyenne Gray DIE M. 01/15/2022 1:15 PM Medical Record Number: [id number] Patient Account Number: [id number] Date of Birth/Sex: Treating RN: 1965-08-08 (56 y.o. [name] Primary Care Beatryce Colombo: McLean-Scocuzza, French Ana Other Clinician: Referring Zivah Mayr: Treating Sylvie Mifsud/Extender: Chauncey Mann, [name], [name] in Treatment: 1 Active Inactive Wound/Skin Impairment Nursing Diagnoses: Knowledge deficit related to ulceration/compromised skin integrity Goals: Patient/caregiver will verbalize understanding of skin care regimen Date Initiated: 01/08/2022 Target Resolution Date: 02/08/2022 Goal Status: Active Ulcer/skin breakdown will have a volume reduction of 30% by week 4 Date Initiated: 01/08/2022 Target Resolution Date: 03/10/2022 Goal Status: Active Ulcer/skin breakdown will have a volume reduction of 50% by week 8 Date Initiated: 01/08/2022 Target Resolution Date: 04/10/2022 Goal Status: Active Ulcer/skin breakdown will have a volume reduction of 80% by week 12 Date Initiated: 01/08/2022 Target Resolution Date: 05/11/2022 Goal Status: Active Ulcer/skin breakdown will heal within 14 weeks Date Initiated: 01/08/2022 Target Resolution Date: 06/09/2022 Goal Status: Active Interventions: Assess patient/caregiver ability to obtain necessary supplies Assess patient/caregiver ability to perform ulcer/skin care regimen upon admission and as needed Assess ulceration(s) every visit Notes: Electronic Signature(s) Signed: 01/16/2022 8:11:11 AM By: Yevonne Pax RN Entered By: Yevonne Pax on 01/15/2022 13:32:07 Cheyenne Gray ([id number]) [id number] Page 6 of 8 -------------------------------------------------------------------------------- Pain Assessment Details Patient Name: Date of Service: Cheyenne Gray DIE M. 01/15/2022 1:15 PM Medical Record Number: [id number] Patient Account Number: [id number] Date of Birth/Sex: Treating RN: 1966-02-24 (56 y.o. [name] Primary Care Jaiveer Panas: McLean-Scocuzza, French Ana Other Clinician: Referring Shuntia Exton: Treating Jevaughn Degollado/Extender: Chauncey Mann, [name], [name] in Treatment: 1 Active Problems Location of Pain Severity and Description of Pain Patient Has  Paino No Site Locations Pain Management and Medication Current Pain Management: Electronic Signature(s) Signed: 01/16/2022 8:11:11 AM By: Yevonne Pax RN Entered By: Yevonne Pax on 01/15/2022 13:28:11 -------------------------------------------------------------------------------- Patient/Caregiver Education Details Patient Name: Date of Service: Cheyenne Gray DIE M. 11/1/2023andnbsp1:15 PM Medical Record Number: [id number] Patient Account Number: [id number] Date of Birth/Gender: Treating RN: 09/04/1965 (56 y.o. [name] Primary Care Physician: McLean-Scocuzza, French Ana Other Clinician: Referring Physician: Treating Physician/Extender: Chauncey Mann, [name], [name] in Treatment: 1 Cheyenne Gray, Cheyenne Gray ([id number]) [id number] Page 7 of 8 Education Assessment Education Provided To: Patient Education Topics Provided Wound/Skin Impairment: Methods: Explain/Verbal Responses: State content correctly Electronic Signature(s) Signed: 01/16/2022 8:11:11 AM By: Yevonne Pax RN Entered By: Yevonne Pax on 01/15/2022 13:50:26 -------------------------------------------------------------------------------- Wound Assessment Details Patient Name: Date of Service: Cheyenne Gray DIE M. 01/15/2022 1:15 PM Medical Record Number: [id number] Patient Account Number: [id number] Date of Birth/Sex: Treating RN:  1965-11-16 (56 y.o. [name] Primary Care Leny Morozov: McLean-Scocuzza, French Ana Other Clinician: Referring Barrie Sigmund: Treating Francisca Langenderfer/Extender: RO BSO Dorris Carnes, [name], [name] in Treatment: 1 Wound Status Wound Number: 1 Primary Lesion Etiology: Wound Location: Right, Distal Abdomen - Lower Quadrant Wound Open Wounding Event: Gradually Appeared Status: Date Acquired: 12/15/2021 Comorbid Asthma, Arrhythmia, Congestive Heart Failure, Coronary Artery Weeks Of Treatment: 1 History: Disease, Hypertension, Peripheral Venous  Disease Clustered Wound: No Photos Wound Measurements Length: (cm) 0.7 Width: (cm) 1.5 Depth: (cm) 0.1 Area: (cm) 0.825 Volume: (cm) 0.082 % Reduction in Area: 65% % Reduction in Volume: 65.3% Epithelialization: None Tunneling: No Undermining: No Wound Description Classification: Full Thickness Without Exposed Support Structures Exudate Amount: Medium Exudate Type: Serosanguineous Cheyenne Gray, Cheyenne Gray ([id number]) Exudate Color: red, brown Foul Odor After Cleansing: No Slough/Fibrino Yes [id number] Page 8 of 8 Wound Bed Granulation Amount: Medium (34-66%) Exposed Structure Granulation Quality: Red, Pink Fascia Exposed: No Necrotic Amount: Medium (34-66%) Fat Layer (Subcutaneous Tissue) Exposed: Yes Necrotic Quality: Adherent Slough Tendon Exposed: No Muscle Exposed: No Joint Exposed: No Bone Exposed: No Treatment Notes Wound #1 (Abdomen - Lower Quadrant) Wound Laterality: Right, Distal Cleanser Peri-Wound Care Topical Primary Dressing Hydrofera Blue Ready Transfer Foam, 2.5x2.5 (in/in) Discharge Instruction: Apply Hydrofera Blue Ready to wound bed as directed Secondary Dressing Coverlet Latex-Free Fabric Adhesive Dressings Discharge Instruction: 1.5 x 2 Secured With Compression Wrap Compression Stockings Add-Ons Electronic Signature(s) Signed: 01/16/2022 8:11:11 AM By: Yevonne Pax RN Entered By: Yevonne Pax on 01/15/2022 13:31:02 -------------------------------------------------------------------------------- Vitals Details Patient Name: Date of Service: Cheyenne Gray DIE M. 01/15/2022 1:15 PM Medical Record Number: [id number] Patient Account Number: [id number] Date of Birth/Sex: Treating RN: [birth year] (56 y.o. [name] Primary Care Aamari West: McLean-Scocuzza, French Ana Other Clinician: Referring Shirleymae Hauth: Treating Kemoni Ortega/Extender: RO BSO N, [name], [name] in Treatment: 1 Vital Signs Time Taken:  13:27 Temperature (F): 98.2 Height (in): 65 Pulse (bpm): 53 Weight (lbs): 270 Respiratory Rate (breaths/min): 18 Body Mass Index (BMI): 44.9 Blood Pressure (mmHg): 155/81 Reference Range: 80 - 120 mg / dl Electronic Signature(s) Signed: 01/16/2022 8:11:11 AM By: Yevonne Pax RN Entered By: Yevonne Pax on 01/15/2022 13:28:03

## 2022-01-20 ENCOUNTER — Encounter: Payer: Self-pay | Admitting: Internal Medicine

## 2022-01-20 DIAGNOSIS — Z1231 Encounter for screening mammogram for malignant neoplasm of breast: Secondary | ICD-10-CM

## 2022-01-22 ENCOUNTER — Encounter (HOSPITAL_BASED_OUTPATIENT_CLINIC_OR_DEPARTMENT_OTHER): Payer: Medicare Other | Admitting: Internal Medicine

## 2022-01-22 DIAGNOSIS — I110 Hypertensive heart disease with heart failure: Secondary | ICD-10-CM | POA: Diagnosis not present

## 2022-01-22 DIAGNOSIS — I739 Peripheral vascular disease, unspecified: Secondary | ICD-10-CM | POA: Diagnosis not present

## 2022-01-22 DIAGNOSIS — E668 Other obesity: Secondary | ICD-10-CM

## 2022-01-22 DIAGNOSIS — S31103D Unspecified open wound of abdominal wall, right lower quadrant without penetration into peritoneal cavity, subsequent encounter: Secondary | ICD-10-CM | POA: Diagnosis not present

## 2022-01-22 DIAGNOSIS — L409 Psoriasis, unspecified: Secondary | ICD-10-CM | POA: Diagnosis not present

## 2022-01-22 DIAGNOSIS — I5032 Chronic diastolic (congestive) heart failure: Secondary | ICD-10-CM | POA: Diagnosis not present

## 2022-01-22 DIAGNOSIS — I4581 Long QT syndrome: Secondary | ICD-10-CM | POA: Diagnosis not present

## 2022-01-22 DIAGNOSIS — M545 Low back pain, unspecified: Secondary | ICD-10-CM | POA: Diagnosis not present

## 2022-01-22 DIAGNOSIS — E611 Iron deficiency: Secondary | ICD-10-CM | POA: Diagnosis not present

## 2022-01-22 DIAGNOSIS — Z9884 Bariatric surgery status: Secondary | ICD-10-CM | POA: Diagnosis not present

## 2022-01-22 DIAGNOSIS — I421 Obstructive hypertrophic cardiomyopathy: Secondary | ICD-10-CM | POA: Diagnosis not present

## 2022-01-23 NOTE — Progress Notes (Signed)
SANYLA, SUMMEY ([id number]) [id number] Page 1 of 6 Visit Report for 01/22/2022 Chief Complaint Document Details Patient Name: Date of Service: Cheyenne Gentry DIE M. 01/22/2022 2:30 PM Medical Record Number: [id number] Patient Account Number: [id number] Date of Birth/Sex: Treating RN: [birth year] (56 y.o. Cheyenne Gray Primary Care Provider: McLean-Scocuzza, French Gray Other Clinician: Referring Provider: Treating Provider/Extender: Cheyenne Gray McLean-Scocuzza, Cheyenne Gray in Treatment: 2 Information Obtained from: Patient Chief Complaint 01/08/2022; patient is here for a review of a wound on her right lateral abdomen Electronic Signature(s) Signed: 01/22/2022 3:55:12 PM By: Cheyenne Corwin DO Entered By: Cheyenne Gray on 01/22/2022 14:57:37 -------------------------------------------------------------------------------- HPI Details Patient Name: Date of Service: Cheyenne Gentry DIE M. 01/22/2022 2:30 PM Medical Record Number: [id number] Patient Account Number: [id number] Date of Birth/Sex: Treating RN: [birth year] (56 y.o. Cheyenne Gray Primary Care Provider: McLean-Scocuzza, French Gray Other Clinician: Referring Provider: Treating Provider/Extender: Cheyenne Gray McLean-Scocuzza, Cheyenne Gray in Treatment: 2 History of Present Illness HPI Description: ADMISSION 01/08/2022 This is a 56 year old woman who has been dealing with a wound on her right lateral flank/abdomen for about a month. She was seen in the ER on 11/30/2021 with an open sore I presume the same area. At that time it was draining greenish fluid and was felt to be possibly infected she received a course of the doxycycline without much improvement. She has been using mupirocin topically changing the dressing daily. She tells me that she has been dealing with the consequences of had extensive left lower extremity fracture about a year ago. This is left her with somewhat problems with mobility.  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 Past medical history is really quite extensive and includes heart failure with preserved ejection fraction, PTSD, low back pain, hypertrophic obstructive cardiomyopathy, status post bariatric surgery greater than 20 years ago and an abdominoplasty about 10 to 12 years ago, psoriasis, iron deficiency, long QT, hypertension and PAD 11/1; patient arrives back in clinic with a wound considerably smaller and with a healthy surface she has been using Hydrofera Blue and religiously offloading 11/8; patient presents for follow-up. She has been using Hydrofera Blue without issues. Cheyenne Gray, Cheyenne Gray ([id number]) [id number] Page 2 of 6 Electronic Signature(s) Signed: 01/22/2022 3:55:12 PM By: Cheyenne Corwin DO Entered By: Cheyenne Gray on 01/22/2022 14:57:58 -------------------------------------------------------------------------------- Physical Exam Details Patient Name: Date of Service: Cheyenne Gentry DIE M. 01/22/2022 2:30 PM Medical Record Number: [id number] Patient Account Number: [id number] Date of Birth/Sex: Treating RN: 10-17-1965 (56 y.o. Cheyenne Gray Primary Care Provider: McLean-Scocuzza, French Gray Other Clinician: Referring Provider: Treating Provider/Extender: Cheyenne Gray McLean-Scocuzza, Cheyenne Gray in Treatment: 2 Constitutional . Psychiatric . Notes T the right lateral flank there is a small open wound limited to skin breakdown. o Electronic Signature(s) Signed: 01/22/2022 3:55:12 PM By: Cheyenne Corwin DO Entered By: Cheyenne Gray on 01/22/2022 14:58:47 -------------------------------------------------------------------------------- Physician Orders Details Patient Name: Date of Service: Cheyenne Gentry DIE M. 01/22/2022 2:30 PM Medical Record Number: [id number] Patient Account Number: [id number] Date of Birth/Sex: Treating RN: 12-Aug-1965 (56 y.o. Cheyenne Gray Primary Care Provider:  McLean-Scocuzza, French Gray Other Clinician: Referring Provider: Treating Provider/Extender: Cheyenne Gray McLean-Scocuzza, Cheyenne Gray in Treatment: 2 Verbal / Phone Orders: No Diagnosis Coding Discharge From [facility] Services Discharge from Wound Care Center Treatment Complete Follow-up Appointments Return Appointment in 2 weeks. [address] [name], [name] Gray ([id number]) [id number] Page 3 of 6 May shower; gently cleanse wound with antibacterial soap, rinse and pat dry prior to dressing  wounds Edema Control - Lymphedema / Segmental Compressive Device / Other Elevate, Exercise Daily and A void Standing for Long Periods of Time. Elevate legs to the level of the heart and pump ankles as often as possible Elevate leg(s) parallel to the floor when sitting. Wound Treatment Wound #1 - Abdomen - Lower Quadrant Wound Laterality: Right, Distal Prim Dressing: Hydrofera Blue Ready Transfer Foam, 2.5x2.5 (in/in) 3 x Per Week/30 Days ary Discharge Instructions: Apply Hydrofera Blue Ready to wound bed as directed Secondary Dressing: Coverlet Latex-Free Fabric Adhesive Dressings 3 x Per Week/30 Days Discharge Instructions: 1.5 x 2 Electronic Signature(s) Signed: 01/22/2022 3:55:12 PM By: Cheyenne Corwin DO Entered By: Cheyenne Gray on 01/22/2022 15:00:04 -------------------------------------------------------------------------------- Problem List Details Patient Name: Date of Service: Cheyenne Gentry DIE M. 01/22/2022 2:30 PM Medical Record Number: [id number] Patient Account Number: [id number] Date of Birth/Sex: Treating RN: [birth year] (56 y.o. Cheyenne Gray Primary Care Provider: McLean-Scocuzza, French Gray Other Clinician: Referring Provider: Treating Provider/Extender: Cheyenne Gray McLean-Scocuzza, Cheyenne Gray in Treatment: 2 Active Problems ICD-10 Encounter Code Description Active Date MDM Diagnosis S31.103D Unspecified open wound of abdominal  wall, right lower quadrant without 01/08/2022 No Yes penetration into peritoneal cavity, subsequent encounter E66.8 Other obesity 01/08/2022 No Yes Inactive Problems Resolved Problems Electronic Signature(s) Signed: 01/22/2022 3:55:12 PM By: Cheyenne Corwin DO Entered By: Cheyenne Gray on 01/22/2022 14:57:33 Cheyenne Gray ([id number]) [id number] Page 4 of 6 -------------------------------------------------------------------------------- Progress Note Details Patient Name: Date of Service: Cheyenne Gentry DIE M. 01/22/2022 2:30 PM Medical Record Number: [id number] Patient Account Number: [id number] Date of Birth/Sex: Treating RN: Oct 21, 1965 (56 y.o. Cheyenne Gray Primary Care Provider: McLean-Scocuzza, French Gray Other Clinician: Referring Provider: Treating Provider/Extender: Cheyenne Gray McLean-Scocuzza, Cheyenne Gray in Treatment: 2 Subjective Chief Complaint Information obtained from Patient 01/08/2022; patient is here for a review of a wound on her right lateral abdomen History of Present Illness (HPI) ADMISSION 01/08/2022 This is a 56 year old woman who has been dealing with a wound on her right lateral flank/abdomen for about a month. She was seen in the ER on 11/30/2021 with an open sore I presume the same area. At that time it was draining greenish fluid and was felt to be possibly infected she received a course of the doxycycline without much improvement. She has been using mupirocin topically changing the dressing daily. She tells me that she has been dealing with the consequences of had extensive left lower extremity fracture about a year ago. This is left her with somewhat problems with mobility. 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 Past medical history is really quite extensive and includes heart failure with preserved ejection fraction, PTSD, low back pain, hypertrophic obstructive cardiomyopathy, status post bariatric surgery  greater than 20 years ago and an abdominoplasty about 10 to 12 years ago, psoriasis, iron deficiency, long QT, hypertension and PAD 11/1; patient arrives back in clinic with a wound considerably smaller and with a healthy surface she has been using Hydrofera Blue and religiously offloading 11/8; patient presents for follow-up. She has been using Hydrofera Blue without issues. Objective Constitutional Vitals Time Taken: 2:24 PM, Height: 65 in, Weight: 270 lbs, BMI: 44.9, Temperature: 98.2 F, Pulse: 64 bpm, Respiratory Rate: 16 breaths/min, Blood Pressure: 140/85 mmHg. General Notes: T the right lateral flank there is a small open wound limited to skin breakdown. o Integumentary (Hair, Skin) Wound #1 status is Open. Original cause of wound was Gradually Appeared. The date acquired was: 12/15/2021. The wound has been in  treatment 2 weeks. The wound is located on the Right,Distal Abdomen - Lower Quadrant. The wound measures 0.2cm length x 0.3cm width x 0.1cm depth; 0.047cm^2 area and 0.005cm^3 volume. There is Fat Layer (Subcutaneous Tissue) exposed. There is a medium amount of serosanguineous drainage noted. There is medium (34- 66%) red, pink granulation within the wound bed. There is a medium (34-66%) amount of necrotic tissue within the wound bed including Adherent Slough. Assessment Active Problems ICD-10 Unspecified open wound of abdominal wall, right lower quadrant without penetration into peritoneal cavity, subsequent encounter Other obesity Cheyenne Gray, Cheyenne Gray ([id number]) [id number] Page 5 of 6 Patient's wound appears well-healing. I recommended continuing Hydrofera Blue. Follow-up in 2 weeks and the wound should be healed by then. Plan Discharge From [facility] Services: Discharge from Wound Care Center Treatment Complete Follow-up Appointments: Return Appointment in 2 weeks. Bathing/ Shower/ Hygiene: May shower; gently cleanse wound with antibacterial soap,  rinse and pat dry prior to dressing wounds Edema Control - Lymphedema / Segmental Compressive Device / Other: Elevate, Exercise Daily and Avoid Standing for Long Periods of Time. Elevate legs to the level of the heart and pump ankles as often as possible Elevate leg(s) parallel to the floor when sitting. WOUND #1: - Abdomen - Lower Quadrant Wound Laterality: Right, Distal Prim Dressing: Hydrofera Blue Ready Transfer Foam, 2.5x2.5 (in/in) 3 x Per Week/30 Days ary Discharge Instructions: Apply Hydrofera Blue Ready to wound bed as directed Secondary Dressing: Coverlet Latex-Free Fabric Adhesive Dressings 3 x Per Week/30 Days Discharge Instructions: 1.5 x 2 1. Hydrofera Blue 2. Follow-up in 2 weeks Electronic Signature(s) Signed: 01/22/2022 3:55:12 PM By: Cheyenne Corwin DO Entered By: Cheyenne Gray on 01/22/2022 14:59:42 -------------------------------------------------------------------------------- ROS/PFSH Details Patient Name: Date of Service: Cheyenne Gentry DIE M. 01/22/2022 2:30 PM Medical Record Number: [id number] Patient Account Number: [id number] Date of Birth/Sex: Treating RN: November 29, 1965 (56 y.o. Cheyenne Gray Primary Care Provider: McLean-Scocuzza, French Gray Other Clinician: Referring Provider: Treating Provider/Extender: Cheyenne Gray McLean-Scocuzza, Cheyenne Gray in Treatment: 2 Information Obtained From Patient Respiratory Medical History: Positive for: Asthma Negative for: Chronic Obstructive Pulmonary Disease (COPD) Cardiovascular Medical History: Positive for: Arrhythmia; Congestive Heart Failure; Coronary Artery Disease; Hypertension; Peripheral Venous Disease Immunizations Pneumococcal Vaccine: Received Pneumococcal Vaccination: No Implantable Devices None Cheyenne Gray, Cheyenne Gray ([id number]) [id number] Page 6 of 6 Family and Social History Never smoker; Marital Status - Widowed; Alcohol Use: Rarely; Drug Use: No History; Caffeine Use:  Daily Electronic Signature(s) Signed: 01/22/2022 3:55:12 PM By: Cheyenne Corwin DO Signed: 01/23/2022 4:19:44 PM By: Midge Aver MSN RN CNS WTA Entered By: Cheyenne Gray on 01/22/2022 15:00:15 -------------------------------------------------------------------------------- SuperBill Details Patient Name: Date of Service: Cheyenne Gentry DIE M. 01/22/2022 Medical Record Number: [id number] Patient Account Number: [id number] Date of Birth/Sex: Treating RN: [birth year] (56 y.o. Cheyenne Gray Primary Care Provider: McLean-Scocuzza, French Gray Other Clinician: Referring Provider: Treating Provider/Extender: Cheyenne Gray McLean-Scocuzza, Cheyenne Gray in Treatment: 2 Diagnosis Coding ICD-10 Codes Code Description S31.103D Unspecified open wound of abdominal wall, right lower quadrant without penetration into peritoneal cavity, subsequent encounter E66.8 Other obesity Facility Procedures : CPT4 Code: [id number] Description: 35009 - WOUND CARE VISIT-LEV 2 EST PT Modifier: Quantity: 1 Physician Procedures : CPT4 Code Description Modifier [id number] - WC PHYS LEVEL 3 - EST PT ICD-10 Diagnosis Description S31.103D Unspecified open wound of abdominal wall, right lower quadrant without penetration into peritoneal cavi subsequent encounter E66.8 Other  obesity Quantity: 1 ty, Electronic Signature(s) Signed: 01/22/2022 3:55:12 PM By: Cheyenne Corwin DO Entered By: Cheyenne Gray on  01/22/2022 14:59:53 

## 2022-01-23 NOTE — Progress Notes (Signed)
Cheyenne Gray ([id number]) [id number] Page 1 of 8 Visit Report for 01/22/2022 Arrival Information Details Patient Name: Date of Service: Cheyenne Gentry Cheyenne M. 01/22/2022 2:30 PM Medical Record Number: [id number] Patient Account Number: [id number] Date of Birth/Sex: Treating RN: Nov 15, 1965 (56 y.o. Ginette Pitman Primary Care Mry Lamia: McLean-Scocuzza, French Ana Other Clinician: Referring Avice Funchess: Treating Chinmayi Rumer/Extender: Geralyn Corwin McLean-Scocuzza, [name] in Treatment: 2 Visit Information History Since Last Visit Added or deleted any medications: No Patient Arrived: Ambulatory Any new allergies or adverse reactions: No Arrival Time: 14:23 Had a fall or experienced change in No Accompanied By: self activities of daily living that may affect Transfer Assistance: None risk of falls: Patient Requires Transmission-Based Precautions: No Hospitalized since last visit: No Patient Has Alerts: No Pain Present Now: No Electronic Signature(s) Signed: 01/23/2022 4:19:44 PM By: Midge Aver MSN RN CNS WTA Entered By: Midge Aver on 01/22/2022 14:24:57 -------------------------------------------------------------------------------- Clinic Level of Care Assessment Details Patient Name: Date of Service: [facility] Cheyenne M. 01/22/2022 2:30 PM Medical Record Number: [id number] Patient Account Number: [id number] Date of Birth/Sex: Treating RN: 07-Dec-1965 (56 y.o. Ginette Pitman Primary Care Antiono Ettinger: McLean-Scocuzza, French Ana Other Clinician: Referring Rawn Quiroa: Treating Kateena Degroote/Extender: Geralyn Corwin McLean-Scocuzza, [name] in Treatment: 2 Clinic Level of Care Assessment Items TOOL 4 Quantity Score X- 1 0 Use when only an EandM is performed on FOLLOW-UP visit ASSESSMENTS - Nursing Assessment / Reassessment X- 1 10 Reassessment of Co-morbidities (includes updates in patient status) X- 1 5 Reassessment of Adherence to Treatment  Plan ASSESSMENTS - Wound and Skin A ssessment / Reassessment X - Simple Wound Assessment / Reassessment - one wound 1 5 []  - 0 Complex Wound Assessment / Reassessment - multiple wounds Cheyenne Gray ([name]) [id number] Page 2 of 8 []  - 0 Dermatologic / Skin Assessment (not related to wound area) ASSESSMENTS - Focused Assessment []  - 0 Circumferential Edema Measurements - multi extremities []  - 0 Nutritional Assessment / Counseling / Intervention []  - 0 Lower Extremity Assessment (monofilament, tuning fork, pulses) []  - 0 Peripheral Arterial Disease Assessment (using hand held doppler) ASSESSMENTS - Ostomy and/or Continence Assessment and Care []  - 0 Incontinence Assessment and Management []  - 0 Ostomy Care Assessment and Management (repouching, etc.) PROCESS - Coordination of Care X - Simple Patient / Family Education for ongoing care 1 15 []  - 0 Complex (extensive) Patient / Family Education for ongoing care X- 1 10 Staff obtains 04-06-1999, Records, T Results / Process Orders est []  - 0 Staff telephones HHA, Nursing Homes / Clarify orders / etc []  - 0 Routine Transfer to another Facility (non-emergent condition) []  - 0 Routine Hospital Admission (non-emergent condition) []  - 0 New Admissions / / Ordering NPWT Apligraf, etc. , []  - 0 Emergency Hospital Admission (emergent condition) X- 1 10 Simple Discharge Coordination []  - 0 Complex (extensive) Discharge Coordination PROCESS - Special Needs []  - 0 Pediatric / Minor Patient Management []  - 0 Isolation Patient Management []  - 0 Hearing / Language / Visual special needs []  - 0 Assessment of Community assistance (transportation, D/C planning, etc.) []  - 0 Additional assistance / Altered mentation []  - 0 Support Surface(s) Assessment (bed, cushion, seat, etc.) INTERVENTIONS - Wound Cleansing / Measurement X - Simple Wound Cleansing - one wound 1 5 []  -  0 Complex Wound Cleansing - multiple wounds X- 1 5 Wound Imaging (photographs - any number of wounds) []  - 0 Wound Tracing (instead of photographs) X- 1 5 Simple Wound  Measurement - one wound []  - 0 Complex Wound Measurement - multiple wounds INTERVENTIONS - Wound Dressings []  - 0 Small Wound Dressing one or multiple wounds []  - 0 Medium Wound Dressing one or multiple wounds []  - 0 Large Wound Dressing one or multiple wounds []  - 0 Application of Medications - topical []  - 0 Application of Medications - injection INTERVENTIONS - Miscellaneous []  - 0 External ear exam []  - 0 Specimen Collection (cultures, biopsies, blood, body fluids, etc.) []  - 0 Specimen(s) / Culture(s) sent or taken to Lab for analysis []  - 0 Patient Transfer (multiple staff / / Similar devices) Cheyenne Gray ( ) [id number] Page 3 of 8 []  - 0 Simple Staple / Suture removal (25 or less) []  - 0 Complex Staple / Suture removal (26 or more) []  - 0 Hypo / Hyperglycemic Management (close monitor of Blood Glucose) []  - 0 Ankle / Brachial Index (ABI) - do not check if billed separately X- 1 5 Vital Signs Has the patient been seen at the hospital within the last three years: Yes Total Score: 75 Level Of Care: New/Established - Level 2 Electronic Signature(s) Signed: 01/23/2022 4:19:44 PM By: MSN RN CNS WTA Entered By: on 01/22/2022 14:52:51 -------------------------------------------------------------------------------- Encounter Discharge Information Details Patient Name: Date of Service: Cheyenne M. 01/22/2022 2:30 PM Medical Record Number: Cheyenne Gray Patient Account Number: [name] Date of Birth/Sex: Treating RN: 1965-08-19 (56 y.o. Primary Care Keelan Pomerleau: McLean-Scocuzza, Other Clinician: Referring Iyah Laguna: Treating Latarshia Jersey/Extender: McLean-Scocuzza, in  Treatment: 2 Encounter Discharge Information Items Discharge Condition: Stable Ambulatory Status: Ambulatory Discharge Destination: Home Transportation: Private Auto Accompanied By: self Schedule Follow-up Appointment: No Clinical Summary of Care: Electronic Signature(s) Signed: 01/23/2022 4:19:44 PM By: Midge Aver MSN RN CNS WTA Entered By: Midge Aver on 01/22/2022 14:54:19 -------------------------------------------------------------------------------- Lower Extremity Assessment Details Patient Name: Date of Service: Cheyenne Gentry Cheyenne M. 01/22/2022 2:30 PM Medical Record Number: [id number] Patient Account Number: [id number] Date of Birth/Sex: Treating RN: 06-22-1965 (56 y.o. Ginette Pitman Primary Care Norelle Runnion: McLean-Scocuzza, French Ana Other Clinician: Referring Hercules Hasler: Treating Nadalee Neiswender/Extender: Geralyn Corwin McLean-Scocuzza, [name] in Treatment: 2 Cheyenne Gray (Midge Aver) [id number] Page 4 of 8 Electronic Signature(s) Signed: 01/23/2022 4:19:44 PM By: Cheyenne Gentry MSN RN CNS WTA Entered By: 13/10/2021 on 01/22/2022 14:32:06 -------------------------------------------------------------------------------- Multi Wound Chart Details Patient Name: Date of Service: [id number] Cheyenne M. 01/22/2022 2:30 PM Medical Record Number: [id number] Patient Account Number: Ginette Pitman Date of Birth/Sex: Treating RN: 09-May-1965 (56 y.o. [name] Primary Care Lendon George: McLean-Scocuzza, [name] Other Clinician: Referring Viviene Thurston: Treating Rhyder Koegel/Extender: [id number] McLean-Scocuzza, 04-06-1999 in Treatment: 2 Vital Signs Height(in): 65 Pulse(bpm): 64 Weight(lbs): 270 Blood Pressure(mmHg): 140/85 Body Mass Index(BMI): 44.9 Temperature(F): 98.2 Respiratory Rate(breaths/min): 16 [1:Photos:] [N/A:N/A] Right, Distal Abdomen - Lower N/A N/A Wound Location: Quadrant Gradually Appeared N/A N/A Wounding Event: Lesion N/A  N/A Primary Etiology: Asthma, Arrhythmia, Congestive Heart N/A N/A Comorbid History: Failure, Coronary Artery Disease, Hypertension, Peripheral Venous Disease 12/15/2021 N/A N/A Date Acquired: 2 N/A N/A Weeks of Treatment: Open N/A N/A Wound Status: No N/A N/A Wound Recurrence: 0.2x0.3x0.1 N/A N/A Measurements L x W x D (cm) 0.047 N/A N/A A (cm) : rea 0.005 N/A N/A Volume (cm) : 98.00% N/A N/A % Reduction in Area: 97.90% N/A N/A % Reduction in Volume: Full Thickness Without Exposed N/A N/A Classification: Support Structures Medium N/A N/A Exudate Amount: Serosanguineous N/A N/A Exudate  Type: red, brown N/A N/A Exudate Color: Medium (34-66%) N/A N/A Granulation Amount: Red, Pink N/A N/A Granulation Quality: Medium (34-66%) N/A N/A Necrotic Amount: Fat Layer (Subcutaneous Tissue): Yes N/A N/A Exposed Structures: Fascia: No Tendon: No Muscle: No Joint: No Bone: No None N/A N/A EpithelializationLIZANIA, Cheyenne Gray ([id number]) [id number] Page 5 of 8 Treatment Notes Electronic Signature(s) Signed: 01/23/2022 4:19:44 PM By: Midge Aver MSN RN CNS WTA Entered By: Midge Aver on 01/22/2022 14:48:41 -------------------------------------------------------------------------------- Multi-Disciplinary Care Plan Details Patient Name: Date of Service: Cheyenne Gentry Cheyenne M. 01/22/2022 2:30 PM Medical Record Number: [id number] Patient Account Number: [id number] Date of Birth/Sex: Treating RN: 31-Aug-1965 (56 y.o. Ginette Pitman Primary Care Tymeka Privette: McLean-Scocuzza, French Ana Other Clinician: Referring Anela Bensman: Treating Loris Winrow/Extender: Geralyn Corwin McLean-Scocuzza, [name] in Treatment: 2 Active Inactive Wound/Skin Impairment Nursing Diagnoses: Knowledge deficit related to ulceration/compromised skin integrity Goals: Patient/caregiver will verbalize understanding of skin care regimen Date Initiated: 01/08/2022 Target  Resolution Date: 02/08/2022 Goal Status: Active Ulcer/skin breakdown will have a volume reduction of 30% by week 4 Date Initiated: 01/08/2022 Target Resolution Date: 03/10/2022 Goal Status: Active Ulcer/skin breakdown will have a volume reduction of 50% by week 8 Date Initiated: 01/08/2022 Target Resolution Date: 04/10/2022 Goal Status: Active Ulcer/skin breakdown will have a volume reduction of 80% by week 12 Date Initiated: 01/08/2022 Target Resolution Date: 05/11/2022 Goal Status: Active Ulcer/skin breakdown will heal within 14 weeks Date Initiated: 01/08/2022 Target Resolution Date: 06/09/2022 Goal Status: Active Interventions: Assess patient/caregiver ability to obtain necessary supplies Assess patient/caregiver ability to perform ulcer/skin care regimen upon admission and as needed Assess ulceration(s) every visit Notes: Electronic Signature(s) Signed: 01/23/2022 4:19:44 PM By: Midge Aver MSN RN CNS WTA Entered By: Midge Aver on 01/22/2022 14:32:10 [name] ([id number]) [id number] Page 6 of 8 -------------------------------------------------------------------------------- Pain Assessment Details Patient Name: Date of Service: Cheyenne Gentry Cheyenne M. 01/22/2022 2:30 PM Medical Record Number: [id number] Patient Account Number: [id number] Date of Birth/Sex: Treating RN: [birth year] (56 y.o. Ginette Pitman Primary Care Cheyenne Gray: McLean-Scocuzza, French Ana Other Clinician: Referring Vishruth Seoane: Treating Cheyenne Gray/Extender: Geralyn Corwin McLean-Scocuzza, [name] in Treatment: 2 Active Problems Location of Pain Severity and Description of Pain Patient Has Paino No Site Locations Pain Management and Medication Current Pain Management: Electronic Signature(s) Signed: 01/23/2022 4:19:44 PM By: Midge Aver MSN RN CNS WTA Entered By: Midge Aver on 01/22/2022  14:26:54 -------------------------------------------------------------------------------- Patient/Caregiver Education Details Patient Name: Date of Service: Cheyenne Gentry Cheyenne M. 11/8/2023andnbsp2:30 PM Medical Record Number: [id number] Patient Account Number: [id number] Date of Birth/Gender: Treating RN: [birth year] (56 y.o. Ginette Pitman Primary Care Physician: McLean-Scocuzza, French Ana Other Clinician: Referring Physician: Treating Physician/Extender: Geralyn Corwin McLean-Scocuzza, [name] in Treatment: 2 Cheyenne Gray ([id number]) [id number] Page 7 of 8 Education Assessment Education Provided To: Patient Education Topics Provided Wound/Skin Impairment: Handouts: Caring for Your Ulcer Methods: Explain/Verbal Responses: State content correctly Electronic Signature(s) Signed: 01/23/2022 4:19:44 PM By: Midge Aver MSN RN CNS WTA Entered By: Midge Aver on 01/22/2022 14:53:13 -------------------------------------------------------------------------------- Wound Assessment Details Patient Name: Date of Service: Cheyenne Gentry Cheyenne M. 01/22/2022 2:30 PM Medical Record Number: [id number] Patient Account Number: [id number] Date of Birth/Sex: Treating RN: Sep 14, 1965 (56 y.o. Ginette Pitman Primary Care Camiyah Friberg: McLean-Scocuzza, French Ana Other Clinician: Referring Ayala Ribble: Treating Cheyenne Gray/Extender: Geralyn Corwin McLean-Scocuzza, [name] in Treatment: 2 Wound Status Wound Number: 1 Primary Lesion Etiology: Wound Location: Right, Distal Abdomen - Lower Quadrant Wound Open Wounding Event: Gradually Appeared Status: Date Acquired: 12/15/2021 Comorbid Asthma, Arrhythmia, Congestive Heart  Failure, Coronary Artery Weeks Of Treatment: 2 History: Disease, Hypertension, Peripheral Venous Disease Clustered Wound: No Photos Wound Measurements Length: (cm) 0.2 Width: (cm) 0.3 Depth: (cm) 0.1 Area: (cm) 0.047 Volume: (cm) 0.005 % Reduction in  Area: 98% % Reduction in Volume: 97.9% Epithelialization: None Wound Description Classification: Full Thickness Without Exposed Support Structures Exudate Amount: Medium Exudate Type: Serosanguineous Cheyenne Gray ([id number]) Exudate Color: red, brown Foul Odor After Cleansing: No Slough/Fibrino Yes [id number] Page 8 of 8 Wound Bed Granulation Amount: Medium (34-66%) Exposed Structure Granulation Quality: Red, Pink Fascia Exposed: No Necrotic Amount: Medium (34-66%) Fat Layer (Subcutaneous Tissue) Exposed: Yes Necrotic Quality: Adherent Slough Tendon Exposed: No Muscle Exposed: No Joint Exposed: No Bone Exposed: No Treatment Notes Wound #1 (Abdomen - Lower Quadrant) Wound Laterality: Right, Distal Cleanser Peri-Wound Care Topical Primary Dressing Hydrofera Blue Ready Transfer Foam, 2.5x2.5 (in/in) Discharge Instruction: Apply Hydrofera Blue Ready to wound bed as directed Secondary Dressing Coverlet Latex-Free Fabric Adhesive Dressings Discharge Instruction: 1.5 x 2 Secured With Compression Wrap Compression Stockings Add-Ons Electronic Signature(s) Signed: 01/23/2022 4:19:44 PM By: Midge Aver MSN RN CNS WTA Entered By: Midge Aver on 01/22/2022 14:31:20 -------------------------------------------------------------------------------- Vitals Details Patient Name: Date of Service: Cheyenne Gentry Cheyenne M. 01/22/2022 2:30 PM Medical Record Number: [id number] Patient Account Number: [id number] Date of Birth/Sex: Treating RN: Nov 06, 1965 (56 y.o. Ginette Pitman Primary Care Ruwayda Curet: McLean-Scocuzza, French Ana Other Clinician: Referring Deitrich Steve: Treating Sahej Hauswirth/Extender: Geralyn Corwin McLean-Scocuzza, [name] in Treatment: 2 Vital Signs Time Taken: 14:24 Temperature (F): 98.2 Height (in): 65 Pulse (bpm): 64 Weight (lbs): 270 Respiratory Rate (breaths/min): 16 Body Mass Index (BMI): 44.9 Blood Pressure (mmHg): 140/85 Reference  Range: 80 - 120 mg / dl Electronic Signature(s) Signed: 01/23/2022 4:19:44 PM By: Midge Aver MSN RN CNS WTA Entered By: Midge Aver on 01/22/2022 14:26:46

## 2022-02-05 ENCOUNTER — Encounter (HOSPITAL_BASED_OUTPATIENT_CLINIC_OR_DEPARTMENT_OTHER): Payer: Medicare Other | Admitting: Internal Medicine

## 2022-02-05 DIAGNOSIS — I421 Obstructive hypertrophic cardiomyopathy: Secondary | ICD-10-CM | POA: Diagnosis not present

## 2022-02-05 DIAGNOSIS — E668 Other obesity: Secondary | ICD-10-CM

## 2022-02-05 DIAGNOSIS — S31103D Unspecified open wound of abdominal wall, right lower quadrant without penetration into peritoneal cavity, subsequent encounter: Secondary | ICD-10-CM

## 2022-02-05 DIAGNOSIS — E611 Iron deficiency: Secondary | ICD-10-CM | POA: Diagnosis not present

## 2022-02-05 DIAGNOSIS — I5032 Chronic diastolic (congestive) heart failure: Secondary | ICD-10-CM | POA: Diagnosis not present

## 2022-02-05 DIAGNOSIS — I4581 Long QT syndrome: Secondary | ICD-10-CM | POA: Diagnosis not present

## 2022-02-05 DIAGNOSIS — Z9884 Bariatric surgery status: Secondary | ICD-10-CM | POA: Diagnosis not present

## 2022-02-05 DIAGNOSIS — I739 Peripheral vascular disease, unspecified: Secondary | ICD-10-CM | POA: Diagnosis not present

## 2022-02-05 DIAGNOSIS — I110 Hypertensive heart disease with heart failure: Secondary | ICD-10-CM | POA: Diagnosis not present

## 2022-02-05 DIAGNOSIS — L409 Psoriasis, unspecified: Secondary | ICD-10-CM | POA: Diagnosis not present

## 2022-02-05 DIAGNOSIS — M545 Low back pain, unspecified: Secondary | ICD-10-CM | POA: Diagnosis not present

## 2022-02-05 DIAGNOSIS — S82142A Displaced bicondylar fracture of left tibia, initial encounter for closed fracture: Secondary | ICD-10-CM | POA: Diagnosis not present

## 2022-02-06 NOTE — Progress Notes (Signed)
Cheyenne Gray ([id number]) [id number] Page 1 of 6 Visit Report for 02/05/2022 Chief Complaint Document Details Patient Name: Date of Service: Cheyenne Gentry DIE M. 02/05/2022 3:15 PM Medical Record Number: [id number] Patient Account Number: [id number] Date of Birth/Sex: Treating RN: 1965-04-24 (56 y.o. Ginette Pitman Primary Care Provider: McLean-Scocuzza, French Ana Other Clinician: Referring Provider: Treating Provider/Extender: Geralyn Corwin McLean-Scocuzza, [name] in Treatment: 4 Information Obtained from: Patient Chief Complaint 01/08/2022; patient is here for a review of a wound on her right lateral abdomen Electronic Signature(s) Signed: 02/05/2022 4:53:17 PM By: Geralyn Corwin DO Entered By: Geralyn Corwin on 02/05/2022 15:35:11 -------------------------------------------------------------------------------- Debridement Details Patient Name: Date of Service: Cheyenne Gentry DIE M. 02/05/2022 3:15 PM Medical Record Number: [id number] Patient Account Number: [id number] Date of Birth/Sex: Treating RN: 06-02-1965 (56 y.o. Ginette Pitman Primary Care Provider: McLean-Scocuzza, French Ana Other Clinician: Referring Provider: Treating Provider/Extender: Geralyn Corwin McLean-Scocuzza, [name] in Treatment: 4 Debridement Performed for Assessment: Wound #1 Right,Distal Abdomen - Lower Quadrant Performed By: Physician Geralyn Corwin, MD Debridement Type: Chemical/Enzymatic/Mechanical Agent Used: Other Level of Consciousness (Pre-procedure): Awake and Alert Pre-procedure Verification/Time Out No Taken: Instrument: Other : saline gauze Bleeding: Minimum Hemostasis Achieved: Pressure Response to Treatment: Procedure was tolerated well Level of Consciousness (Post- Awake and Alert procedure): Post Debridement Measurements of Total Wound Length: (cm) 0.9 Width: (cm) 0.4 Depth: (cm) 0.1 Cheyenne Gray ([id number])  [id number] Page 2 of 6 Volume: (cm) 0.028 Character of Wound/Ulcer Post Debridement: Stable Post Procedure Diagnosis Same as Pre-procedure Electronic Signature(s) Signed: 02/05/2022 4:52:27 PM By: Midge Aver MSN RN CNS WTA Signed: 02/05/2022 4:53:17 PM By: Geralyn Corwin DO Entered By: Midge Aver on 02/05/2022 15:47:31 -------------------------------------------------------------------------------- HPI Details Patient Name: Date of Service: Cheyenne Gentry DIE M. 02/05/2022 3:15 PM Medical Record Number: [id number] Patient Account Number: [id number] Date of Birth/Sex: Treating RN: [birth year] (56 y.o. Ginette Pitman Primary Care Provider: McLean-Scocuzza, French Ana Other Clinician: Referring Provider: Treating Provider/Extender: Geralyn Corwin McLean-Scocuzza, [name] in Treatment: 4 History of Present Illness HPI Description: ADMISSION 01/08/2022 This is a 56 year old woman who has been dealing with a wound on her right lateral flank/abdomen for about a month. She was seen in the ER on 11/30/2021 with an open sore I presume the same area. At that time it was draining greenish fluid and was felt to be possibly infected she received a course of the doxycycline without much improvement. She has been using mupirocin topically changing the dressing daily. She tells me that she has been dealing with the consequences of had extensive left lower extremity fracture about a year ago. This is left her with somewhat problems with mobility. 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 Past medical history is really quite extensive and includes heart failure with preserved ejection fraction, PTSD, low back pain, hypertrophic obstructive cardiomyopathy, status post bariatric surgery greater than 20 years ago and an abdominoplasty about 10 to 12 years ago, psoriasis, iron deficiency, long QT, hypertension and PAD 11/1; patient arrives back in clinic with a wound  considerably smaller and with a healthy surface she has been using Hydrofera Blue and religiously offloading 11/8; patient presents for follow-up. She has been using Hydrofera Blue without issues. 11/22; patient presents for follow-up. She has been using Hydrofera Blue. The wound has reopened. She denies signs of infection. Electronic Signature(s) Signed: 02/05/2022 4:53:17 PM By: Geralyn Corwin DO Entered By: Geralyn Corwin on 02/05/2022 15:35:48 Physical Exam Details -------------------------------------------------------------------------------- Cheyenne Gray ([id number]) [id number]  Page 3 of 6 Patient Name: Date of Service: Cheyenne Gentry DIE M. 02/05/2022 3:15 PM Medical Record Number: [id number] Patient Account Number: [id number] Date of Birth/Sex: Treating RN: [birth year] (56 y.o. Ginette Pitman Primary Care Provider: McLean-Scocuzza, French Ana Other Clinician: Referring Provider: Treating Provider/Extender: Geralyn Corwin McLean-Scocuzza, [name] in Treatment: 4 Constitutional . Psychiatric . Notes T the right lateral flank there is an open wound with granulation tissue present. o Electronic Signature(s) Signed: 02/05/2022 4:53:17 PM By: Geralyn Corwin DO Entered By: Geralyn Corwin on 02/05/2022 15:36:11 -------------------------------------------------------------------------------- Physician Orders Details Patient Name: Date of Service: Cheyenne Gentry DIE M. 02/05/2022 3:15 PM Medical Record Number: [id number] Patient Account Number: [id number] Date of Birth/Sex: Treating RN: [birth year] (56 y.o. Ginette Pitman Primary Care Provider: McLean-Scocuzza, French Ana Other Clinician: Referring Provider: Treating Provider/Extender: Geralyn Corwin McLean-Scocuzza, [name] in Treatment: 4 Verbal / Phone Orders: No Diagnosis Coding ICD-10 Coding Code Description S31.103D Unspecified open wound of abdominal wall, right lower quadrant  without penetration into peritoneal cavity, subsequent encounter E66.8 Other obesity Follow-up Appointments Return Appointment in 1 week. Wound Treatment Wound #1 - Abdomen - Lower Quadrant Wound Laterality: Right, Distal Topical: Activon Honey Gel, 25 (g) Tube Every Other Day/30 Days Prim Dressing: AquacelAg Advantage Dressing, 2X2 (in/in) (DME) (Generic) Every Other Day/30 Days ary Discharge Instructions: Apply to wound as directed Electronic Signature(s) Signed: 02/05/2022 4:52:27 PM By: Midge Aver MSN RN CNS WTA Signed: 02/05/2022 4:53:17 PM By: Geralyn Corwin DO Entered By: Midge Aver on 02/05/2022 15:51:16 Cheyenne Gray ([id number]) [id number] Page 4 of 6 -------------------------------------------------------------------------------- Problem List Details Patient Name: Date of Service: Cheyenne Gentry DIE M. 02/05/2022 3:15 PM Medical Record Number: [id number] Patient Account Number: [id number] Date of Birth/Sex: Treating RN: [birth year] (56 y.o. Ginette Pitman Primary Care Provider: McLean-Scocuzza, French Ana Other Clinician: Referring Provider: Treating Provider/Extender: Geralyn Corwin McLean-Scocuzza, [name] in Treatment: 4 Active Problems ICD-10 Encounter Code Description Active Date MDM Diagnosis S31.103D Unspecified open wound of abdominal wall, right lower quadrant without 01/08/2022 No Yes penetration into peritoneal cavity, subsequent encounter E66.8 Other obesity 01/08/2022 No Yes Inactive Problems Resolved Problems Electronic Signature(s) Signed: 02/05/2022 4:52:27 PM By: Midge Aver MSN RN CNS WTA Signed: 02/05/2022 4:53:17 PM By: Geralyn Corwin DO Entered By: Midge Aver on 02/05/2022 15:48:41 -------------------------------------------------------------------------------- Progress Note Details Patient Name: Date of Service: Cheyenne Gentry DIE M. 02/05/2022 3:15 PM Medical Record Number: [id number] Patient  Account Number: [id number] Date of Birth/Sex: Treating RN: 1965/09/27 (56 y.o. Ginette Pitman Primary Care Provider: McLean-Scocuzza, French Ana Other Clinician: Referring Provider: Treating Provider/Extender: Geralyn Corwin McLean-Scocuzza, [name] in Treatment: 4 Subjective Chief Complaint Information obtained from Patient 01/08/2022; patient is here for a review of a wound on her right lateral abdomen Cheyenne Gray, Cheyenne Gray ([id number]) [id number] Page 5 of 6 History of Present Illness (HPI) ADMISSION 01/08/2022 This is a 56 year old woman who has been dealing with a wound on her right lateral flank/abdomen for about a month. She was seen in the ER on 11/30/2021 with an open sore I presume the same area. At that time it was draining greenish fluid and was felt to be possibly infected she received a course of the doxycycline without much improvement. She has been using mupirocin topically changing the dressing daily. She tells me that she has been dealing with the consequences of had extensive left lower extremity fracture about a year ago. This is left her with somewhat problems with mobility. 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 Past medical history is really quite extensive and includes heart failure with preserved ejection fraction, PTSD, low back pain, hypertrophic obstructive cardiomyopathy, status post bariatric surgery greater than 20 years ago and an abdominoplasty about 10 to 12 years ago, psoriasis, iron deficiency, long QT, hypertension and PAD 11/1; patient arrives back in clinic with a wound considerably smaller and with a healthy surface she has been using Hydrofera Blue and religiously offloading 11/8; patient presents for follow-up. She has been using Hydrofera Blue without issues. 11/22; patient presents for follow-up. She has been using Hydrofera Blue. The wound has reopened. She denies signs of  infection. Objective Constitutional Vitals Time Taken: 3:25 PM, Height: 65 in, Weight: 270 lbs, BMI: 44.9, Temperature: 98.1 F, Pulse: 60 bpm, Respiratory Rate: 16 breaths/min, Blood Pressure: 149/81 mmHg. General Notes: T the right lateral flank there is an open wound with granulation tissue present. o Integumentary (Hair, Skin) Wound #1 status is Open. Original cause of wound was Gradually Appeared. The date acquired was: 12/15/2021. The wound has been in treatment 4 weeks. The wound is located on the Right,Distal Abdomen - Lower Quadrant. The wound measures 0.9cm length x 0.4cm width x 0.1cm depth; 0.283cm^2 area and 0.028cm^3 volume. There is Fat Layer (Subcutaneous Tissue) exposed. There is a medium amount of serosanguineous drainage noted. There is medium (34- 66%) red, pink granulation within the wound bed. There is a medium (34-66%) amount of necrotic tissue within the wound bed including Adherent Slough. Assessment Active Problems ICD-10 Unspecified open wound of abdominal wall, right lower quadrant without penetration into peritoneal cavity, subsequent encounter Other obesity Patient's wound is slightly larger than last clinic visit. There are some concern that the [facility] Blue is sticking to the wound bed. I recommend at this time switching to Medihoney and silver alginate every other day to the wound bed. Aggressive offloading. Follow-up in 1 week. Plan 1. Silver alginate with Medihoney 2. Aggressive offloading 3. Follow-up in 1 week Electronic Signature(s) Signed: 02/05/2022 4:53:17 PM By: Geralyn Corwin DO Entered By: Geralyn Corwin on 02/05/2022 15:37:29 Cheyenne Gray ([id number]) [id number] Page 6 of 6 -------------------------------------------------------------------------------- SuperBill Details Patient Name: Date of Service: Cheyenne Gentry DIE M. 02/05/2022 Medical Record Number: [id number] Patient Account Number:  [id number] Date of Birth/Sex: Treating RN: 1965/05/26 (56 y.o. Ginette Pitman Primary Care Provider: McLean-Scocuzza, French Ana Other Clinician: Referring Provider: Treating Provider/Extender: Geralyn Corwin McLean-Scocuzza, [name] in Treatment: 4 Diagnosis Coding ICD-10 Codes Code Description S31.103D Unspecified open wound of abdominal wall, right lower quadrant without penetration into peritoneal cavity, subsequent encounter E66.8 Other obesity Facility Procedures : CPT4 Code: [id number] Description: 99213 - WOUND CARE VISIT-LEV 3 EST PT Modifier: Quantity: 1 Physician Procedures : CPT4 Code Description Modifier [id number] 99213 - WC PHYS LEVEL 3 - EST PT ICD-10 Diagnosis Description S31.103D Unspecified open wound of abdominal wall, right lower quadrant without penetration into peritoneal cavi subsequent encounter E66.8 Other  obesity Quantity: 1 ty, Electronic Signature(s) Signed: 02/05/2022 5:52:07 PM By: Elliot Gurney, BSN, RN, CWS, Kim RN, BSN Previous Signature: 02/05/2022 4:52:27 PM Version By: Midge Aver MSN RN CNS WTA Previous Signature: 02/05/2022 4:53:17 PM Version By: Geralyn Corwin DO Entered By: Elliot Gurney, BSN, RN, CWS, Kim on 02/05/2022 17:52:07

## 2022-02-06 NOTE — Progress Notes (Signed)
[name], [name] ([id number]) [id number] Page 1 of 8 Visit Report for 02/05/2022 Arrival Information Details Patient Name: Date of Service: Cheyenne Gray DIE M. 02/05/2022 3:15 PM Medical Record Number: [id number] Patient Account Number: [id number] Date of Birth/Sex: Treating RN: [birth year] (56 y.o. Cheyenne Pitman Primary Care Tramaine Snell: McLean-Scocuzza, French Ana Other Clinician: Referring Kersten Salmons: Treating Raimundo Corbit/Extender: Geralyn Corwin McLean-Scocuzza, [name] in Treatment: 4 Visit Information History Since Last Visit Added or deleted any medications: No Patient Arrived: [name] Any new allergies or adverse reactions: No Arrival Time: 15:20 Had a fall or experienced change in No Accompanied By: self activities of daily living that may affect Transfer Assistance: None risk of falls: Patient Identification Verified: Yes Signs or symptoms of abuse/neglect since last visito No Secondary Verification Process Completed: Yes Hospitalized since last visit: No Patient Requires Transmission-Based Precautions: No Pain Present Now: No Patient Has Alerts: No Electronic Signature(s) Signed: 02/05/2022 4:52:27 PM By: Midge Aver MSN RN CNS WTA Entered By: Midge Aver on 02/05/2022 15:23:05 -------------------------------------------------------------------------------- Clinic Level of Care Assessment Details Patient Name: Date of Service: [facility] DIE M. 02/05/2022 3:15 PM Medical Record Number: [id number] Patient Account Number: [id number] Date of Birth/Sex: Treating RN: 1965/07/29 (56 y.o. Cheyenne Pitman Primary Care Shivonne Schwartzman: McLean-Scocuzza, French Ana Other Clinician: Referring Joakim Huesman: Treating Petrona Wyeth/Extender: Geralyn Corwin McLean-Scocuzza, [name] in Treatment: 4 Clinic Level of Care Assessment Items TOOL 4 Quantity Score X- 1 0 Use when only an EandM is performed on FOLLOW-UP visit ASSESSMENTS - Nursing Assessment /  Reassessment X- 1 10 Reassessment of Co-morbidities (includes updates in patient status) X- 1 5 Reassessment of Adherence to Treatment Plan ASSESSMENTS - Wound and Skin A ssessment / Reassessment X - Simple Wound Assessment / Reassessment - one wound 1 5 []  - 0 Complex Wound Assessment / Reassessment - multiple wounds MARELI, [name] ([name]) [id number] Page 2 of 8 []  - 0 Dermatologic / Skin Assessment (not related to wound area) ASSESSMENTS - Focused Assessment []  - 0 Circumferential Edema Measurements - multi extremities []  - 0 Nutritional Assessment / Counseling / Intervention []  - 0 Lower Extremity Assessment (monofilament, tuning fork, pulses) []  - 0 Peripheral Arterial Disease Assessment (using hand held doppler) ASSESSMENTS - Ostomy and/or Continence Assessment and Care []  - 0 Incontinence Assessment and Management []  - 0 Ostomy Care Assessment and Management (repouching, etc.) PROCESS - Coordination of Care X - Simple Patient / Family Education for ongoing care 1 15 []  - 0 Complex (extensive) Patient / Family Education for ongoing care X- 1 10 Staff obtains 11-15-1996, Records, T Results / Process Orders est []  - 0 Staff telephones HHA, Nursing Homes / Clarify orders / etc []  - 0 Routine Transfer to another Facility (non-emergent condition) []  - 0 Routine Hospital Admission (non-emergent condition) []  - 0 New Admissions / / Ordering NPWT Apligraf, etc. , []  - 0 Emergency Hospital Admission (emergent condition) X- 1 10 Simple Discharge Coordination []  - 0 Complex (extensive) Discharge Coordination PROCESS - Special Needs []  - 0 Pediatric / Minor Patient Management []  - 0 Isolation Patient Management []  - 0 Hearing / Language / Visual special needs []  - 0 Assessment of Community assistance (transportation, D/C planning, etc.) []  - 0 Additional assistance / Altered mentation []  - 0 Support  Surface(s) Assessment (bed, cushion, seat, etc.) INTERVENTIONS - Wound Cleansing / Measurement X - Simple Wound Cleansing - one wound 1 5 []  - 0 Complex Wound Cleansing - multiple wounds X- 1 5 Wound Imaging (photographs -  any number of wounds) []  - 0 Wound Tracing (instead of photographs) X- 1 5 Simple Wound Measurement - one wound []  - 0 Complex Wound Measurement - multiple wounds INTERVENTIONS - Wound Dressings X - Small Wound Dressing one or multiple wounds 1 10 []  - 0 Medium Wound Dressing one or multiple wounds []  - 0 Large Wound Dressing one or multiple wounds []  - 0 Application of Medications - topical []  - 0 Application of Medications - injection INTERVENTIONS - Miscellaneous []  - 0 External ear exam []  - 0 Specimen Collection (cultures, biopsies, blood, body fluids, etc.) []  - 0 Specimen(s) / Culture(s) sent or taken to Lab for analysis Altamese DillingHOMPSON, Dineen M ([id number]) [id number] Page 3 of 8 []  - 0 Patient Transfer (multiple staff / Nurse, adultHoyer Lift / Similar devices) []  - 0 Simple Staple / Suture removal (25 or less) []  - 0 Complex Staple / Suture removal (26 or more) []  - 0 Hypo / Hyperglycemic Management (close monitor of Blood Glucose) []  - 0 Ankle / Brachial Index (ABI) - do not check if billed separately X- 1 5 Vital Signs Has the patient been seen at the hospital within the last three years: Yes Total Score: 85 Level Of Care: New/Established - Level 3 Electronic Signature(s) Signed: 02/05/2022 4:52:27 PM By: Midge AverSmith, Vicki MSN RN CNS WTA Entered By: Midge AverSmith, Vicki on 02/05/2022 15:48:17 -------------------------------------------------------------------------------- Encounter Discharge Information Details Patient Name: Date of Service: Cheyenne Cheyenne Gray, JO DIE M. 02/05/2022 3:15 PM Medical Record Number: [id number] Patient Account Number: [id number] Date of Birth/Sex: Treating RN: 10-27-1965 (56 y.o. Cheyenne Gray) Smith, Vicki Primary Care Oval Cavazos:  McLean-Scocuzza, French Anaracy Other Clinician: Referring Ayodele Sangalang: Treating Hong Moring/Extender: Geralyn CorwinHoffman, Jessica McLean-Scocuzza, Delmer Islamracy Weeks in Treatment: 4 Encounter Discharge Information Items Post Procedure Vitals Discharge Condition: Stable Temperature (F): 98.1 Ambulatory Status: Ambulatory Pulse (bpm): 60 Discharge Destination: Home Respiratory Rate (breaths/min): 16 Transportation: Private Auto Blood Pressure (mmHg): 149/81 Accompanied By: self Schedule Follow-up Appointment: Yes Clinical Summary of Care: Electronic Signature(s) Signed: 02/05/2022 4:52:27 PM By: Midge AverSmith, Vicki MSN RN CNS WTA Entered By: Midge AverSmith, Vicki on 02/05/2022 15:52:16 -------------------------------------------------------------------------------- Lower Extremity Assessment Details Patient Name: Date of Service: Cheyenne Cheyenne Gray, JO DIE M. 02/05/2022 3:15 PM Medical Record Number: [id number] Patient Account Number: [id number] Date of Birth/Sex: Treating RN: 10-27-1965 (56 y.o. Cheyenne Gray) Smith, Vicki Primary Care Manette Doto: McLean-Scocuzza, French Anaracy Other Clinician: Referring Kindall Swaby: Treating Syreeta Figler/Extender: Geralyn CorwinHoffman, Jessica McLean-Scocuzza, Lacie Scottsracy Bors, Cathey EndowJODIE M ([id number]) [id number] Page 4 of 8 Weeks in Treatment: 4 Electronic Signature(s) Signed: 02/05/2022 4:52:27 PM By: Midge AverSmith, Vicki MSN RN CNS WTA Entered By: Midge AverSmith, Vicki on 02/05/2022 15:28:46 -------------------------------------------------------------------------------- Multi Wound Chart Details Patient Name: Date of Service: Cheyenne Cheyenne Gray, JO DIE M. 02/05/2022 3:15 PM Medical Record Number: [id number] Patient Account Number: [id number] Date of Birth/Sex: Treating RN: 10-27-1965 (56 y.o. Cheyenne Gray) Smith, Vicki Primary Care Laneshia Pina: McLean-Scocuzza, French Anaracy Other Clinician: Referring Marianny Goris: Treating Garik Diamant/Extender: Geralyn CorwinHoffman, Jessica McLean-Scocuzza, Delmer Islamracy Weeks in Treatment: 4 Vital Signs Height(in): 65 Pulse(bpm): 60 Weight(lbs):  270 Blood Pressure(mmHg): 149/81 Body Mass Index(BMI): 44.9 Temperature(F): 98.1 Respiratory Rate(breaths/min): 16 [1:Photos:] [Gray/A:Gray/A] Right, Distal Abdomen - Lower Gray/A Gray/A Wound Location: Quadrant Gradually Appeared Gray/A Gray/A Wounding Event: Lesion Gray/A Gray/A Primary Etiology: Asthma, Arrhythmia, Congestive Heart Gray/A Gray/A Comorbid History: Failure, Coronary Artery Disease, Hypertension, Peripheral Venous Disease 12/15/2021 Gray/A Gray/A Date Acquired: 4 Gray/A Gray/A Weeks of Treatment: Open Gray/A Gray/A Wound Status: No Gray/A Gray/A Wound Recurrence: 0.9x0.4x0.1 Gray/A Gray/A Measurements L x W x D (cm) 0.283 Gray/A Gray/A A (cm) : rea 0.028 Gray/A Gray/A  Volume (cm) : 88.00% Gray/A Gray/A % Reduction in Area: 88.10% Gray/A Gray/A % Reduction in Volume: Full Thickness Without Exposed Gray/A Gray/A Classification: Support Structures Medium Gray/A Gray/A Exudate Amount: Serosanguineous Gray/A Gray/A Exudate Type: red, brown Gray/A Gray/A Exudate Color: Medium (34-66%) Gray/A Gray/A Granulation Amount: Red, Pink Gray/A Gray/A Granulation Quality: Medium (34-66%) Gray/A Gray/A Necrotic Amount: Fat Layer (Subcutaneous Tissue): Yes Gray/A Gray/A Exposed Structures: Fascia: No Tendon: No Muscle: No Joint: No Bone: No DEMIRA, GWYNNE ([id number]) [id number] Page 5 of 8 None Gray/A Gray/A Epithelialization: Treatment Notes Electronic Signature(s) Signed: 02/05/2022 4:52:27 PM By: Midge Aver MSN RN CNS WTA Entered By: Midge Aver on 02/05/2022 15:30:32 -------------------------------------------------------------------------------- Multi-Disciplinary Care Plan Details Patient Name: Date of Service: Cheyenne Gray DIE M. 02/05/2022 3:15 PM Medical Record Number: [id number] Patient Account Number: [id number] Date of Birth/Sex: Treating RN: 24-Nov-1965 (56 y.o. Cheyenne Pitman Primary Care Adewale Pucillo: McLean-Scocuzza, French Ana Other Clinician: Referring Chevelle Durr: Treating Aydan Levitz/Extender: Geralyn Corwin McLean-Scocuzza,  [name] in Treatment: 4 Active Inactive Wound/Skin Impairment Nursing Diagnoses: Knowledge deficit related to ulceration/compromised skin integrity Goals: Patient/caregiver will verbalize understanding of skin care regimen Date Initiated: 01/08/2022 Target Resolution Date: 02/08/2022 Goal Status: Active Ulcer/skin breakdown will have a volume reduction of 30% by week 4 Date Initiated: 01/08/2022 Target Resolution Date: 03/10/2022 Goal Status: Active Ulcer/skin breakdown will have a volume reduction of 50% by week 8 Date Initiated: 01/08/2022 Target Resolution Date: 04/10/2022 Goal Status: Active Ulcer/skin breakdown will have a volume reduction of 80% by week 12 Date Initiated: 01/08/2022 Target Resolution Date: 05/11/2022 Goal Status: Active Ulcer/skin breakdown will heal within 14 weeks Date Initiated: 01/08/2022 Target Resolution Date: 06/09/2022 Goal Status: Active Interventions: Assess patient/caregiver ability to obtain necessary supplies Assess patient/caregiver ability to perform ulcer/skin care regimen upon admission and as needed Assess ulceration(s) every visit Notes: Electronic Signature(s) Signed: 02/05/2022 4:52:27 PM By: Midge Aver MSN RN CNS WTA Entered By: Midge Aver on 02/05/2022 15:28:51 [name] ([id number]) [id number] Page 6 of 8 -------------------------------------------------------------------------------- Pain Assessment Details Patient Name: Date of Service: Cheyenne Gray DIE M. 02/05/2022 3:15 PM Medical Record Number: [id number] Patient Account Number: [id number] Date of Birth/Sex: Treating RN: 11/16/1965 (56 y.o. Cheyenne Pitman Primary Care Inri Sobieski: McLean-Scocuzza, French Ana Other Clinician: Referring Avigdor Dollar: Treating Charnita Trudel/Extender: Geralyn Corwin McLean-Scocuzza, [name] in Treatment: 4 Active Problems Location of Pain Severity and Description of Pain Patient Has Paino No Site  Locations Pain Management and Medication Current Pain Management: Electronic Signature(s) Signed: 02/05/2022 4:52:27 PM By: Midge Aver MSN RN CNS WTA Entered By: Midge Aver on 02/05/2022 15:25:38 -------------------------------------------------------------------------------- Patient/Caregiver Education Details Patient Name: Date of Service: Cheyenne Gray DIE M. 11/22/2023andnbsp3:15 PM Medical Record Number: [id number] Patient Account Number: [id number] Date of Birth/Gender: Treating RN: 1965/10/28 (56 y.o. Cheyenne Pitman Primary Care Physician: McLean-Scocuzza, French Ana Other Clinician: Referring Physician: Treating Physician/Extender: Geralyn Corwin McLean-Scocuzza, [name] in Treatment: 4 NAZ, DENUNZIO ([id number]) [id number] Page 7 of 8 Education Assessment Education Provided To: Patient Education Topics Provided Wound/Skin Impairment: Handouts: Caring for Your Ulcer Methods: Explain/Verbal Responses: State content correctly Electronic Signature(s) Signed: 02/05/2022 4:52:27 PM By: Midge Aver MSN RN CNS WTA Entered By: Midge Aver on 02/05/2022 15:48:37 -------------------------------------------------------------------------------- Wound Assessment Details Patient Name: Date of Service: Cheyenne Gray DIE M. 02/05/2022 3:15 PM Medical Record Number: [id number] Patient Account Number: [id number] Date of Birth/Sex: Treating RN: 1965/10/25 (56 y.o. Cheyenne Pitman Primary Care Renea Schoonmaker: McLean-Scocuzza, French Ana Other Clinician: Referring Faven Watterson: Treating Faithlyn Recktenwald/Extender: Geralyn Corwin McLean-Scocuzza, French Ana  Weeks in Treatment: 4 Wound Status Wound Number: 1 Primary Lesion Etiology: Wound Location: Right, Distal Abdomen - Lower Quadrant Wound Open Wounding Event: Gradually Appeared Status: Date Acquired: 12/15/2021 Comorbid Asthma, Arrhythmia, Congestive Heart Failure, Coronary Artery Weeks Of Treatment: 4 History:  Disease, Hypertension, Peripheral Venous Disease Clustered Wound: No Photos Wound Measurements Length: (cm) 0.9 Width: (cm) 0.4 Depth: (cm) 0.1 Area: (cm) 0.283 Volume: (cm) 0.028 % Reduction in Area: 88% % Reduction in Volume: 88.1% Epithelialization: None Wound Description Classification: Full Thickness Without Exposed Support Structures Exudate Amount: Medium Exudate Type: Serosanguineous JAQLYN, GRUENHAGEN ([id number]) Exudate Color: red, brown Foul Odor After Cleansing: No Slough/Fibrino Yes [id number] Page 8 of 8 Wound Bed Granulation Amount: Medium (34-66%) Exposed Structure Granulation Quality: Red, Pink Fascia Exposed: No Necrotic Amount: Medium (34-66%) Fat Layer (Subcutaneous Tissue) Exposed: Yes Necrotic Quality: Adherent Slough Tendon Exposed: No Muscle Exposed: No Joint Exposed: No Bone Exposed: No Treatment Notes Wound #1 (Abdomen - Lower Quadrant) Wound Laterality: Right, Distal Cleanser Peri-Wound Care Topical Activon Honey Gel, 25 (g) Tube Primary Dressing AquacelAg Advantage Dressing, 2X2 (in/in) Discharge Instruction: Apply to wound as directed Secondary Dressing Secured With Compression Wrap Compression Stockings Add-Ons Electronic Signature(s) Signed: 02/05/2022 4:52:27 PM By: Midge Aver MSN RN CNS WTA Entered By: Midge Aver on 02/05/2022 15:28:35 -------------------------------------------------------------------------------- Vitals Details Patient Name: Date of Service: Cheyenne Gray DIE M. 02/05/2022 3:15 PM Medical Record Number: [id number] Patient Account Number: [id number] Date of Birth/Sex: Treating RN: 1966/01/03 (56 y.o. Cheyenne Pitman Primary Care Okechukwu Regnier: McLean-Scocuzza, French Ana Other Clinician: Referring Deborahann Poteat: Treating Demar Shad/Extender: Geralyn Corwin McLean-Scocuzza, [name] in Treatment: 4 Vital Signs Time Taken: 15:25 Temperature (F): 98.1 Height (in): 65 Pulse (bpm):  60 Weight (lbs): 270 Respiratory Rate (breaths/min): 16 Body Mass Index (BMI): 44.9 Blood Pressure (mmHg): 149/81 Reference Range: 80 - 120 mg / dl Electronic Signature(s) Signed: 02/05/2022 4:52:27 PM By: Midge Aver MSN RN CNS WTA Entered By: Midge Aver on 02/05/2022 15:25:32

## 2022-02-07 DIAGNOSIS — S31109A Unspecified open wound of abdominal wall, unspecified quadrant without penetration into peritoneal cavity, initial encounter: Secondary | ICD-10-CM | POA: Diagnosis not present

## 2022-02-11 ENCOUNTER — Ambulatory Visit (INDEPENDENT_AMBULATORY_CARE_PROVIDER_SITE_OTHER): Payer: Medicare Other | Admitting: Licensed Clinical Social Worker

## 2022-02-11 DIAGNOSIS — F4310 Post-traumatic stress disorder, unspecified: Secondary | ICD-10-CM

## 2022-02-11 NOTE — Plan of Care (Signed)
  Problem: Reduce the negative impact trauma related symptoms have on social, occupational, and family functioning per pt self report 3 out of 5 sessions documented. PTSD-Trauma Disorder CCP Problem  1  Goal: LTG: Reduce frequency, intensity, and duration of PTSD symptoms so daily functioning is improved: Input needed on appropriate metric.  per pt self report Outcome: Progressing Goal: LTG: Elimination of maladaptive behaviors and thinking patterns which interfere with resolution of trauma: per pt self report Outcome: Progressing Intervention: Encourage patient to identify triggers Note: Assisted w/ identification  Intervention: Encourage verbalization of feelings/concerns/expectations Note: Allowed pt to explore/express
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
## 2022-02-12 ENCOUNTER — Ambulatory Visit: Payer: Medicare Other | Admitting: Internal Medicine

## 2022-02-12 DIAGNOSIS — M791 Myalgia, unspecified site: Secondary | ICD-10-CM | POA: Diagnosis not present

## 2022-02-12 DIAGNOSIS — I10 Essential (primary) hypertension: Secondary | ICD-10-CM | POA: Diagnosis not present

## 2022-02-12 DIAGNOSIS — G894 Chronic pain syndrome: Secondary | ICD-10-CM | POA: Diagnosis not present

## 2022-02-12 DIAGNOSIS — M5440 Lumbago with sciatica, unspecified side: Secondary | ICD-10-CM | POA: Diagnosis not present

## 2022-02-12 DIAGNOSIS — Z79891 Long term (current) use of opiate analgesic: Secondary | ICD-10-CM | POA: Diagnosis not present

## 2022-02-12 DIAGNOSIS — G8921 Chronic pain due to trauma: Secondary | ICD-10-CM | POA: Diagnosis not present

## 2022-02-12 DIAGNOSIS — Z79899 Other long term (current) drug therapy: Secondary | ICD-10-CM | POA: Diagnosis not present

## 2022-02-12 DIAGNOSIS — G8929 Other chronic pain: Secondary | ICD-10-CM | POA: Diagnosis not present

## 2022-02-12 DIAGNOSIS — M542 Cervicalgia: Secondary | ICD-10-CM | POA: Diagnosis not present

## 2022-02-19 ENCOUNTER — Encounter: Payer: Medicare Other | Attending: Internal Medicine | Admitting: Internal Medicine

## 2022-02-19 DIAGNOSIS — I110 Hypertensive heart disease with heart failure: Secondary | ICD-10-CM | POA: Diagnosis not present

## 2022-02-19 DIAGNOSIS — S31109A Unspecified open wound of abdominal wall, unspecified quadrant without penetration into peritoneal cavity, initial encounter: Secondary | ICD-10-CM | POA: Diagnosis not present

## 2022-02-19 DIAGNOSIS — I2510 Atherosclerotic heart disease of native coronary artery without angina pectoris: Secondary | ICD-10-CM | POA: Insufficient documentation

## 2022-02-19 DIAGNOSIS — J45909 Unspecified asthma, uncomplicated: Secondary | ICD-10-CM | POA: Insufficient documentation

## 2022-02-19 DIAGNOSIS — I421 Obstructive hypertrophic cardiomyopathy: Secondary | ICD-10-CM | POA: Insufficient documentation

## 2022-02-19 DIAGNOSIS — I5032 Chronic diastolic (congestive) heart failure: Secondary | ICD-10-CM | POA: Insufficient documentation

## 2022-02-19 DIAGNOSIS — S31103D Unspecified open wound of abdominal wall, right lower quadrant without penetration into peritoneal cavity, subsequent encounter: Secondary | ICD-10-CM | POA: Diagnosis not present

## 2022-02-19 DIAGNOSIS — E668 Other obesity: Secondary | ICD-10-CM

## 2022-02-19 DIAGNOSIS — Z9884 Bariatric surgery status: Secondary | ICD-10-CM | POA: Diagnosis not present

## 2022-02-20 NOTE — Progress Notes (Signed)
Cheyenne Gray ([id number]) [id number] Page 1 of 6 Visit Report for 02/19/2022 Chief Complaint Document Details Patient Name: Date of Service: Cheyenne Born DIE M. 02/19/2022 2:30 PM Medical Record Number: [id number] Patient Account Number: [id number] Date of Birth/Sex: Treating RN: April 10, 1965 (56 y.o. Cheyenne Gray Primary Care Provider: McLean-Scocuzza, Olivia Mackie Other Clinician: Referring Provider: Treating Provider/Extender: Kalman Shan McLean-Scocuzza, Harless Nakayama in Treatment: 6 Information Obtained from: Patient Chief Complaint 01/08/2022; patient is here for a review of a wound on her right lateral abdomen Electronic Signature(s) Signed: 02/19/2022 2:38:28 PM By: Kalman Shan DO Entered By: Kalman Shan on 02/19/2022 14:34:28 -------------------------------------------------------------------------------- HPI Details Patient Name: Date of Service: Cheyenne Born DIE M. 02/19/2022 2:30 PM Medical Record Number: [id number] Patient Account Number: [id number] Date of Birth/Sex: Treating RN: 1965-06-22 (56 y.o. Cheyenne Gray Primary Care Provider: McLean-Scocuzza, Olivia Mackie Other Clinician: Referring Provider: Treating Provider/Extender: Kalman Shan McLean-Scocuzza, Harless Nakayama in Treatment: 6 History of Present Illness HPI Description: ADMISSION 01/08/2022 This is a 56 year old woman who has been dealing with a wound on her right lateral flank/abdomen for about a month. She was seen in the ER on 11/30/2021 with an open sore I presume the same area. At that time it was draining greenish fluid and was felt to be possibly infected she received a course of the doxycycline without much improvement. She has been using mupirocin topically changing the dressing daily. She tells me that she has been dealing with the consequences of had extensive left lower extremity fracture about a year ago. This is left her with somewhat problems with mobility.  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 Past medical history is really quite extensive and includes heart failure with preserved ejection fraction, PTSD, low back pain, hypertrophic obstructive cardiomyopathy, status post bariatric surgery greater than 20 years ago and an abdominoplasty about 10 to 12 years ago, psoriasis, iron deficiency, long QT, hypertension and PAD 11/1; patient arrives back in clinic with a wound considerably smaller and with a healthy surface she has been using Hydrofera Blue and religiously offloading 11/8; patient presents for follow-up. She has been using Hydrofera Blue without issues. Cheyenne Gray ([id number]) [id number] Page 2 of 6 11/22; patient presents for follow-up. She has been using Hydrofera Blue. The wound has reopened. She denies signs of infection. 12/6; patient presents for follow-up. She has been using silver alginate to the wound bed. She just obtained Medihoney yesterday. She has no issues or complaints today. She tries to offload the area. Electronic Signature(s) Signed: 02/19/2022 2:38:28 PM By: Kalman Shan DO Entered By: Kalman Shan on 02/19/2022 14:35:46 -------------------------------------------------------------------------------- Physical Exam Details Patient Name: Date of Service: Cheyenne Born DIE M. 02/19/2022 2:30 PM Medical Record Number: [id number] Patient Account Number: [id number] Date of Birth/Sex: Treating RN: [birth year] (56 y.o. Cheyenne Gray Primary Care Provider: McLean-Scocuzza, Olivia Mackie Other Clinician: Referring Provider: Treating Provider/Extender: Kalman Shan McLean-Scocuzza, Harless Nakayama in Treatment: 6 Constitutional . Psychiatric . Notes T the right lateral flank there is an open wound with granulation tissue present. o Electronic Signature(s) Signed: 02/19/2022 2:38:28 PM By: Kalman Shan DO Entered By: Kalman Shan on 02/19/2022  14:36:01 -------------------------------------------------------------------------------- Physician Orders Details Patient Name: Date of Service: Cheyenne Born DIE M. 02/19/2022 2:30 PM Medical Record Number: [id number] Patient Account Number: [id number] Date of Birth/Sex: Treating RN: 07/02/1965 (56 y.o. Cheyenne Gray Primary Care Provider: McLean-Scocuzza, Olivia Mackie Other Clinician: Referring Provider: Treating Provider/Extender: Kalman Shan McLean-Scocuzza, Harless Nakayama in Treatment: 6  Verbal / Phone Orders: No Diagnosis Coding Follow-up Appointments Return Appointment in 2 weeks. Cheyenne Gray ([id number]) [id number] Page 3 of 6 Wound Treatment Wound #1 - Abdomen - Lower Quadrant Wound Laterality: Right, Distal Topical: Activon Honey Gel, 25 (g) Tube Every Other Day/30 Days Prim Dressing: Silvercel Small 2x2 (in/in) Every Other Day/30 Days ary Discharge Instructions: Apply Silvercel Small 2x2 (in/in) as instructed Electronic Signature(s) Signed: 02/19/2022 2:38:28 PM By: Kalman Shan DO Entered By: Kalman Shan on 02/19/2022 14:37:48 -------------------------------------------------------------------------------- Problem List Details Patient Name: Date of Service: Cheyenne Born DIE M. 02/19/2022 2:30 PM Medical Record Number: [id number] Patient Account Number: [id number] Date of Birth/Sex: Treating RN: July 01, 1965 (56 y.o. Cheyenne Gray Primary Care Provider: McLean-Scocuzza, Olivia Mackie Other Clinician: Referring Provider: Treating Provider/Extender: Kalman Shan McLean-Scocuzza, Harless Nakayama in Treatment: 6 Active Problems ICD-10 Encounter Code Description Active Date MDM Diagnosis S31.103D Unspecified open wound of abdominal wall, right lower quadrant without 01/08/2022 No Yes penetration into peritoneal cavity, subsequent encounter E66.8 Other obesity 01/08/2022 No Yes Inactive Problems Resolved Problems Electronic  Signature(s) Signed: 02/19/2022 2:38:28 PM By: Kalman Shan DO Entered By: Kalman Shan on 02/19/2022 14:34:23 Cheyenne Gray ([id number]) [id number] Page 4 of 6 -------------------------------------------------------------------------------- Progress Note Details Patient Name: Date of Service: Cheyenne Born DIE M. 02/19/2022 2:30 PM Medical Record Number: [id number] Patient Account Number: [id number] Date of Birth/Sex: Treating RN: 01-02-1966 (56 y.o. Cheyenne Gray Primary Care Provider: McLean-Scocuzza, Olivia Mackie Other Clinician: Referring Provider: Treating Provider/Extender: Kalman Shan McLean-Scocuzza, Harless Nakayama in Treatment: 6 Subjective Chief Complaint Information obtained from Patient 01/08/2022; patient is here for a review of a wound on her right lateral abdomen History of Present Illness (HPI) ADMISSION 01/08/2022 This is a 56 year old woman who has been dealing with a wound on her right lateral flank/abdomen for about a month. She was seen in the ER on 11/30/2021 with an open sore I presume the same area. At that time it was draining greenish fluid and was felt to be possibly infected she received a course of the doxycycline without much improvement. She has been using mupirocin topically changing the dressing daily. She tells me that she has been dealing with the consequences of had extensive left lower extremity fracture about a year ago. This is left her with somewhat problems with mobility. 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 Past medical history is really quite extensive and includes heart failure with preserved ejection fraction, PTSD, low back pain, hypertrophic obstructive cardiomyopathy, status post bariatric surgery greater than 20 years ago and an abdominoplasty about 10 to 12 years ago, psoriasis, iron deficiency, long QT, hypertension and PAD 11/1; patient arrives back in clinic with a wound considerably  smaller and with a healthy surface she has been using Hydrofera Blue and religiously offloading 11/8; patient presents for follow-up. She has been using Hydrofera Blue without issues. 11/22; patient presents for follow-up. She has been using Hydrofera Blue. The wound has reopened. She denies signs of infection. 12/6; patient presents for follow-up. She has been using silver alginate to the wound bed. She just obtained Medihoney yesterday. She has no issues or complaints today. She tries to offload the area. Objective Constitutional Vitals Time Taken: 2:12 PM, Height: 65 in, Weight: 270 lbs, BMI: 44.9, Temperature: 98.2 F, Pulse: 56 bpm, Respiratory Rate: 16 breaths/min, Blood Pressure: 138/95 mmHg. General Notes: T the right lateral flank there is an open wound with granulation tissue present. o Integumentary (Hair, Skin) Wound #1 status is Open.  Original cause of wound was Gradually Appeared. The date acquired was: 12/15/2021. The wound has been in treatment 6 weeks. The wound is located on the Right,Distal Abdomen - Lower Quadrant. The wound measures 0.5cm length x 0.7cm width x 0.2cm depth; 0.275cm^2 area and 0.055cm^3 volume. There is Fat Layer (Subcutaneous Tissue) exposed. There is a medium amount of serosanguineous drainage noted. There is medium (34- 66%) red, pink granulation within the wound bed. There is a medium (34-66%) amount of necrotic tissue within the wound bed including Adherent Slough. Assessment Active Problems ICD-10 Unspecified open wound of abdominal wall, right lower quadrant without penetration into peritoneal cavity, subsequent encounter Other obesity Patient's wound is stable. I recommended starting the Medihoney and continuing silver alginate. This is a precarious area as her pants are likely creating friction to this area keeping the wound open. I recommended aggressive offloading. Follow-up in 2 weeks. Plan Cheyenne Gray, Cheyenne Gray ([id number])  [id number] Page 5 of 6 Follow-up Appointments: Return Appointment in 2 weeks. WOUND #1: - Abdomen - Lower Quadrant Wound Laterality: Right, Distal Topical: Activon Honey Gel, 25 (g) Tube Every Other Day/30 Days Prim Dressing: Silvercel Small 2x2 (in/in) Every Other Day/30 Days ary Discharge Instructions: Apply Silvercel Small 2x2 (in/in) as instructed 1. Silver alginate with Medihoney 2. Offload 3. Follow-up in 2 weeks Electronic Signature(s) Signed: 02/19/2022 2:38:28 PM By: Geralyn Corwin DO Entered By: Geralyn Corwin on 02/19/2022 14:37:28 -------------------------------------------------------------------------------- ROS/PFSH Details Patient Name: Date of Service: Cheyenne Gentry DIE M. 02/19/2022 2:30 PM Medical Record Number: [id number] Patient Account Number: [id number] Date of Birth/Sex: Treating RN: 05/31/1965 (56 y.o. Ginette Pitman Primary Care Provider: McLean-Scocuzza, French Ana Other Clinician: Referring Provider: Treating Provider/Extender: Geralyn Corwin McLean-Scocuzza, [name] in Treatment: 6 Information Obtained From Patient Respiratory Medical History: Positive for: Asthma Negative for: Chronic Obstructive Pulmonary Disease (COPD) Cardiovascular Medical History: Positive for: Arrhythmia; Congestive Heart Failure; Coronary Artery Disease; Hypertension; Peripheral Venous Disease Immunizations Pneumococcal Vaccine: Received Pneumococcal Vaccination: No Implantable Devices None Family and Social History Never smoker; Marital Status - Widowed; Alcohol Use: Rarely; Drug Use: No History; Caffeine Use: Daily Electronic Signature(s) Signed: 02/19/2022 2:38:28 PM By: Geralyn Corwin DO Signed: 02/19/2022 4:17:24 PM By: Midge Aver MSN RN CNS WTA Entered By: Geralyn Corwin on 02/19/2022 14:37:57 [name] ([id number]) [id number] Page 6 of  6 -------------------------------------------------------------------------------- SuperBill Details Patient Name: Date of Service: Cheyenne Gentry DIE M. 02/19/2022 Medical Record Number: [id number] Patient Account Number: [id number] Date of Birth/Sex: Treating RN: 01/14/1966 (56 y.o. Ginette Pitman Primary Care Provider: McLean-Scocuzza, French Ana Other Clinician: Referring Provider: Treating Provider/Extender: Geralyn Corwin McLean-Scocuzza, [name] in Treatment: 6 Diagnosis Coding ICD-10 Codes Code Description S31.103D Unspecified open wound of abdominal wall, right lower quadrant without penetration into peritoneal cavity, subsequent encounter E66.8 Other obesity Facility Procedures : CPT4 Code: [id number] Description: 99213 - WOUND CARE VISIT-LEV 3 EST PT Modifier: Quantity: 1 Physician Procedures : CPT4 Code Description Modifier [id number] 99213 - WC PHYS LEVEL 3 - EST PT ICD-10 Diagnosis Description S31.103D Unspecified open wound of abdominal wall, right lower quadrant without penetration into peritoneal cavi subsequent encounter E66.8 Other  obesity Quantity: 1 ty, Electronic Signature(s) Signed: 02/19/2022 2:38:28 PM By: Geralyn Corwin DO Entered By: Geralyn Corwin on 02/19/2022 14:37:41

## 2022-02-20 NOTE — Progress Notes (Signed)
Cheyenne, Gray ([id number]) [id number] Page 1 of 8 Visit Report for 02/19/2022 Arrival Information Details Patient Name: Date of Service: Cheyenne Gray DIE M. 02/19/2022 2:30 PM Medical Record Number: [id number] Patient Account Number: [id number] Date of Birth/Sex: Treating RN: [birth year] (56 y.o. Cheyenne Gray Primary Care Karlissa Aron: McLean-Scocuzza, French Ana Other Clinician: Referring Cresencio Reesor: Treating Lenola Lockner/Extender: Geralyn Corwin McLean-Scocuzza, [name] in Treatment: 6 Visit Information History Since Last Visit Added or deleted any medications: No Patient Arrived: [name] Any new allergies or adverse reactions: No Arrival Time: 14:10 Had a fall or experienced change in No Accompanied By: self activities of daily living that may affect Transfer Assistance: None risk of falls: Patient Identification Verified: Yes Hospitalized since last visit: No Secondary Verification Process Completed: Yes Pain Present Now: No Patient Requires Transmission-Based Precautions: No Patient Has Alerts: No Electronic Signature(s) Signed: 02/19/2022 4:17:24 PM By: Midge Aver MSN RN CNS WTA Entered By: Midge Aver on 02/19/2022 14:12:31 -------------------------------------------------------------------------------- Clinic Level of Care Assessment Details Patient Name: Date of Service: Cheyenne Gray DIE M. 02/19/2022 2:30 PM Medical Record Number: [id number] Patient Account Number: [id number] Date of Birth/Sex: Treating RN: 05-18-1965 (56 y.o. Cheyenne Gray Primary Care Amisadai Woodford: McLean-Scocuzza, French Ana Other Clinician: Referring Audery Wassenaar: Treating Mikaia Janvier/Extender: Geralyn Corwin McLean-Scocuzza, [name] in Treatment: 6 Clinic Level of Care Assessment Items TOOL 4 Quantity Score X- 1 0 Use when only an EandM is performed on FOLLOW-UP visit ASSESSMENTS - Nursing Assessment / Reassessment X- 1 10 Reassessment of Co-morbidities (includes updates in  patient status) X- 1 5 Reassessment of Adherence to Treatment Plan ASSESSMENTS - Wound and Skin A ssessment / Reassessment X - Simple Wound Assessment / Reassessment - one wound 1 5 []  - 0 Complex Wound Assessment / Reassessment - multiple wounds CHAREE, TUMBLIN (Cheyenne Gray) [id number] Page 2 of 8 []  - 0 Dermatologic / Skin Assessment (not related to wound area) ASSESSMENTS - Focused Assessment []  - 0 Circumferential Edema Measurements - multi extremities []  - 0 Nutritional Assessment / Counseling / Intervention []  - 0 Lower Extremity Assessment (monofilament, tuning fork, pulses) []  - 0 Peripheral Arterial Disease Assessment (using hand held doppler) ASSESSMENTS - Ostomy and/or Continence Assessment and Care []  - 0 Incontinence Assessment and Management []  - 0 Ostomy Care Assessment and Management (repouching, etc.) PROCESS - Coordination of Care X - Simple Patient / Family Education for ongoing care 1 15 []  - 0 Complex (extensive) Patient / Family Education for ongoing care X- 1 10 Staff obtains 03-05-1971, Records, T Results / Process Orders est []  - 0 Staff telephones HHA, Nursing Homes / Clarify orders / etc []  - 0 Routine Transfer to another Facility (non-emergent condition) []  - 0 Routine Hospital Admission (non-emergent condition) []  - 0 New Admissions / / Ordering NPWT Apligraf, etc. , []  - 0 Emergency Hospital Admission (emergent condition) X- 1 10 Simple Discharge Coordination []  - 0 Complex (extensive) Discharge Coordination PROCESS - Special Needs []  - 0 Pediatric / Minor Patient Management []  - 0 Isolation Patient Management []  - 0 Hearing / Language / Visual special needs []  - 0 Assessment of Community assistance (transportation, D/C planning, etc.) []  - 0 Additional assistance / Altered mentation []  - 0 Support Surface(s) Assessment (bed, cushion, seat, etc.) INTERVENTIONS - Wound Cleansing /  Measurement X - Simple Wound Cleansing - one wound 1 5 []  - 0 Complex Wound Cleansing - multiple wounds X- 1 5 Wound Imaging (photographs - any number of wounds) []  - 0 Wound  Tracing (instead of photographs) X- 1 5 Simple Wound Measurement - one wound []  - 0 Complex Wound Measurement - multiple wounds INTERVENTIONS - Wound Dressings X - Small Wound Dressing one or multiple wounds 1 10 []  - 0 Medium Wound Dressing one or multiple wounds []  - 0 Large Wound Dressing one or multiple wounds []  - 0 Application of Medications - topical []  - 0 Application of Medications - injection INTERVENTIONS - Miscellaneous []  - 0 External ear exam []  - 0 Specimen Collection (cultures, biopsies, blood, body fluids, etc.) []  - 0 Specimen(s) / Culture(s) sent or taken to Lab for analysis JULIEANN, DRUMMONDS ( ) [id number] Page 3 of 8 []  - 0 Patient Transfer (multiple staff / / Similar devices) []  - 0 Simple Staple / Suture removal (25 or less) []  - 0 Complex Staple / Suture removal (26 or more) []  - 0 Hypo / Hyperglycemic Management (close monitor of Blood Glucose) []  - 0 Ankle / Brachial Index (ABI) - do not check if billed separately X- 1 5 Vital Signs Has the patient been seen at the hospital within the last three years: Yes Total Score: 85 Level Of Care: New/Established - Level 3 Electronic Signature(s) Signed: 02/19/2022 4:17:24 PM By: MSN RN CNS WTA Entered By: on 02/19/2022 14:29:15 -------------------------------------------------------------------------------- Encounter Discharge Information Details Patient Name: Date of Service: Cheyenne Gray DIE M. 02/19/2022 2:30 PM Medical Record Number: [id number] Patient Account Number: Date of Birth/Sex: Treating RN: 1966/03/12 (56 y.o. Primary Care Kayloni Rocco: McLean-Scocuzza, Other Clinician: Referring Shewanda Sharpe: Treating  Channin Agustin/Extender: McLean-Scocuzza, 14/08/2021 in Treatment: 6 Encounter Discharge Information Items Discharge Condition: Stable Ambulatory Status: Cane Discharge Destination: Home Transportation: Private Auto Accompanied By: self Schedule Follow-up Appointment: Yes Clinical Summary of Care: Electronic Signature(s) Signed: 02/19/2022 4:17:24 PM By: Midge Aver MSN RN CNS WTA Entered By: 14/08/2021 on 02/19/2022 14:30:48 -------------------------------------------------------------------------------- Lower Extremity Assessment Details Patient Name: Date of Service: 14/08/2021 DIE M. 02/19/2022 2:30 PM Medical Record Number: [id number] Patient Account Number: 10/25/1965 Date of Birth/Sex: Treating RN: September 30, 1965 (56 y.o. French Ana Primary Care Pietro Bonura: McLean-Scocuzza, Geralyn Corwin Other Clinician: Referring Javonda Suh: Treating Steve Gregg/Extender: [name] McLean-Scocuzza, 14/08/2021, Midge Aver (Midge Aver) [id number] Page 4 of 8 Weeks in Treatment: 6 Electronic Signature(s) Signed: 02/19/2022 4:17:24 PM By: Cheyenne Gentry MSN RN CNS WTA Entered By: 14/08/2021 on 02/19/2022 14:20:03 -------------------------------------------------------------------------------- Multi Wound Chart Details Patient Name: Date of Service: [id number] DIE M. 02/19/2022 2:30 PM Medical Record Number: [id number] Patient Account Number: Cheyenne Gray Date of Birth/Sex: Treating RN: 08/28/1965 (56 y.o. Lacie Scotts Primary Care Haruo Stepanek: McLean-Scocuzza, Cathey Endow Other Clinician: Referring Christyna Letendre: Treating Charish Schroepfer/Extender: [id number] McLean-Scocuzza, 14/08/2021 in Treatment: 6 Vital Signs Height(in): 65 Pulse(bpm): 56 Weight(lbs): 270 Blood Pressure(mmHg): 138/95 Body Mass Index(BMI): 44.9 Temperature(F): 98.2 Respiratory Rate(breaths/min): 16 [1:Photos:] [N/A:N/A] Right, Distal Abdomen - Lower N/A N/A Wound  Location: Quadrant Gradually Appeared N/A N/A Wounding Event: Lesion N/A N/A Primary Etiology: Asthma, Arrhythmia, Congestive Heart N/A N/A Comorbid History: Failure, Coronary Artery Disease, Hypertension, Peripheral Venous Disease 12/15/2021 N/A N/A Date Acquired: 6 N/A N/A Weeks of Treatment: Open N/A N/A Wound Status: No N/A N/A Wound Recurrence: 0.5x0.7x0.2 N/A N/A Measurements L x W x D (cm) 0.275 N/A N/A A (cm) : rea 0.055 N/A N/A Volume (cm) : 88.30% N/A N/A % Reduction in Area: 76.70% N/A N/A % Reduction in Volume: Full Thickness Without Exposed N/A N/A Classification: Support  Structures Medium N/A N/A Exudate Amount: Serosanguineous N/A N/A Exudate Type: red, brown N/A N/A Exudate Color: Medium (34-66%) N/A N/A Granulation Amount: Red, Pink N/A N/A Granulation Quality: Medium (34-66%) N/A N/A Necrotic Amount: Fat Layer (Subcutaneous Tissue): Yes N/A N/A Exposed Structures: Fascia: No Tendon: No Muscle: No Joint: No Bone: No Cheyenne DillingHOMPSON, Joniqua M ([id number]) [id number] Page 5 of 8 None N/A N/A Epithelialization: Treatment Notes Electronic Signature(s) Signed: 02/19/2022 4:17:24 PM By: Midge AverSmith, Vicki MSN RN CNS WTA Entered By: Midge AverSmith, Vicki on 02/19/2022 14:21:03 -------------------------------------------------------------------------------- Multi-Disciplinary Care Plan Details Patient Name: Date of Service: Cheyenne GentryHO MPSO N, JO DIE M. 02/19/2022 2:30 PM Medical Record Number: [id number] Patient Account Number: [id number] Date of Birth/Sex: Treating RN: Jun 14, 1965 (56 y.o. Cheyenne PitmanF) Smith, Vicki Primary Care Tersea Aulds: McLean-Scocuzza, French Anaracy Other Clinician: Referring Ariel Dimitri: Treating Javid Kemler/Extender: Geralyn CorwinHoffman, Jessica McLean-Scocuzza, Delmer Islamracy Weeks in Treatment: 6 Active Inactive Wound/Skin Impairment Nursing Diagnoses: Knowledge deficit related to ulceration/compromised skin integrity Goals: Patient/caregiver will verbalize  understanding of skin care regimen Date Initiated: 01/08/2022 Target Resolution Date: 02/08/2022 Goal Status: Active Ulcer/skin breakdown will have a volume reduction of 30% by week 4 Date Initiated: 01/08/2022 Target Resolution Date: 03/10/2022 Goal Status: Active Ulcer/skin breakdown will have a volume reduction of 50% by week 8 Date Initiated: 01/08/2022 Target Resolution Date: 04/10/2022 Goal Status: Active Ulcer/skin breakdown will have a volume reduction of 80% by week 12 Date Initiated: 01/08/2022 Target Resolution Date: 05/11/2022 Goal Status: Active Ulcer/skin breakdown will heal within 14 weeks Date Initiated: 01/08/2022 Target Resolution Date: 06/09/2022 Goal Status: Active Interventions: Assess patient/caregiver ability to obtain necessary supplies Assess patient/caregiver ability to perform ulcer/skin care regimen upon admission and as needed Assess ulceration(s) every visit Notes: Electronic Signature(s) Signed: 02/19/2022 4:17:24 PM By: Midge AverSmith, Vicki MSN RN CNS WTA Entered By: Midge AverSmith, Vicki on 02/19/2022 14:29:54 Cheyenne DillingHOMPSON, Tiffanye M ([id number]) [id number] Page 6 of 8 -------------------------------------------------------------------------------- Pain Assessment Details Patient Name: Date of Service: Cheyenne GentryHO MPSO N, JO DIE M. 02/19/2022 2:30 PM Medical Record Number: [id number] Patient Account Number: [id number] Date of Birth/Sex: Treating RN: Jun 14, 1965 (56 y.o. Cheyenne PitmanF) Smith, Vicki Primary Care Iyanni Hepp: McLean-Scocuzza, French Anaracy Other Clinician: Referring Glorya Bartley: Treating Jud Fanguy/Extender: Geralyn CorwinHoffman, Jessica McLean-Scocuzza, Delmer Islamracy Weeks in Treatment: 6 Active Problems Location of Pain Severity and Description of Pain Patient Has Paino No Site Locations Pain Management and Medication Current Pain Management: Electronic Signature(s) Signed: 02/19/2022 4:17:24 PM By: Midge AverSmith, Vicki MSN RN CNS WTA Entered By: Midge AverSmith, Vicki on 02/19/2022  14:14:24 -------------------------------------------------------------------------------- Patient/Caregiver Education Details Patient Name: Date of Service: Cheyenne GentryHO MPSO N, JO DIE M. 12/6/2023andnbsp2:30 PM Medical Record Number: [id number] Patient Account Number: [id number] Date of Birth/Gender: Treating RN: Jun 14, 1965 (56 y.o. Cheyenne PitmanF) Smith, Vicki Primary Care Physician: McLean-Scocuzza, French Anaracy Other Clinician: Referring Physician: Treating Physician/Extender: Geralyn CorwinHoffman, Jessica McLean-Scocuzza, Delmer Islamracy Weeks in Treatment: 6 Cheyenne DillingHOMPSON, Zeidy M ([id number]) [id number] Page 7 of 8 Education Assessment Education Provided To: Patient Education Topics Provided Wound/Skin Impairment: Handouts: Caring for Your Ulcer Methods: Explain/Verbal Responses: State content correctly Electronic Signature(s) Signed: 02/19/2022 4:17:24 PM By: Midge AverSmith, Vicki MSN RN CNS WTA Entered By: Midge AverSmith, Vicki on 02/19/2022 14:29:49 -------------------------------------------------------------------------------- Wound Assessment Details Patient Name: Date of Service: Cheyenne GentryHO MPSO N, JO DIE M. 02/19/2022 2:30 PM Medical Record Number: [id number] Patient Account Number: [id number] Date of Birth/Sex: Treating RN: Jun 14, 1965 (56 y.o. Cheyenne PitmanF) Smith, Vicki Primary Care Sheffield Hawker: McLean-Scocuzza, French Anaracy Other Clinician: Referring Tamyka Bezio: Treating Webster Patrone/Extender: Geralyn CorwinHoffman, Jessica McLean-Scocuzza, Delmer Islamracy Weeks in Treatment: 6 Wound Status Wound Number: 1 Primary Lesion Etiology: Wound Location: Right, Distal Abdomen - Lower Quadrant Wound Open Wounding Event: Gradually  Appeared Status: Date Acquired: 12/15/2021 Comorbid Asthma, Arrhythmia, Congestive Heart Failure, Coronary Artery Weeks Of Treatment: 6 History: Disease, Hypertension, Peripheral Venous Disease Clustered Wound: No Photos Wound Measurements Length: (cm) 0.5 Width: (cm) 0.7 Depth: (cm) 0.2 Area: (cm) 0.275 Volume: (cm) 0.055 % Reduction in  Area: 88.3% % Reduction in Volume: 76.7% Epithelialization: None Wound Description Classification: Full Thickness Without Exposed Support Exudate Amount: Medium Exudate Type: Serosanguineous LORIA, LACINA ([id number]) Exudate Color: red, brown Structures Foul Odor After Cleansing: No Slough/Fibrino Yes [id number] Page 8 of 8 Wound Bed Granulation Amount: Medium (34-66%) Exposed Structure Granulation Quality: Red, Pink Fascia Exposed: No Necrotic Amount: Medium (34-66%) Fat Layer (Subcutaneous Tissue) Exposed: Yes Necrotic Quality: Adherent Slough Tendon Exposed: No Muscle Exposed: No Joint Exposed: No Bone Exposed: No Treatment Notes Wound #1 (Abdomen - Lower Quadrant) Wound Laterality: Right, Distal Cleanser Peri-Wound Care Topical Activon Honey Gel, 25 (g) Tube Primary Dressing Silvercel Small 2x2 (in/in) Discharge Instruction: Apply Silvercel Small 2x2 (in/in) as instructed Secondary Dressing Secured With Compression Wrap Compression Stockings Add-Ons Electronic Signature(s) Signed: 02/19/2022 4:17:24 PM By: Midge Aver MSN RN CNS WTA Entered By: Midge Aver on 02/19/2022 14:18:19 -------------------------------------------------------------------------------- Vitals Details Patient Name: Date of Service: Cheyenne Gray DIE M. 02/19/2022 2:30 PM Medical Record Number: [id number] Patient Account Number: [id number] Date of Birth/Sex: Treating RN: 1965/07/04 (56 y.o. Cheyenne Gray Primary Care Patrik Turnbaugh: McLean-Scocuzza, French Ana Other Clinician: Referring Antar Milks: Treating Aubre Quincy/Extender: Geralyn Corwin McLean-Scocuzza, [name] in Treatment: 6 Vital Signs Time Taken: 14:12 Temperature (F): 98.2 Height (in): 65 Pulse (bpm): 56 Weight (lbs): 270 Respiratory Rate (breaths/min): 16 Body Mass Index (BMI): 44.9 Blood Pressure (mmHg): 138/95 Reference Range: 80 - 120 mg / dl Electronic Signature(s) Signed: 02/19/2022  4:17:24 PM By: Midge Aver MSN RN CNS WTA Entered By: Midge Aver on 02/19/2022 14:14:17

## 2022-03-04 ENCOUNTER — Other Ambulatory Visit: Payer: Self-pay | Admitting: Cardiovascular Disease

## 2022-03-05 ENCOUNTER — Ambulatory Visit: Payer: Medicare Other | Admitting: Internal Medicine

## 2022-03-07 DIAGNOSIS — S82142A Displaced bicondylar fracture of left tibia, initial encounter for closed fracture: Secondary | ICD-10-CM | POA: Diagnosis not present

## 2022-03-12 ENCOUNTER — Encounter: Payer: Medicare Other | Admitting: Internal Medicine

## 2022-03-12 DIAGNOSIS — J45909 Unspecified asthma, uncomplicated: Secondary | ICD-10-CM | POA: Diagnosis not present

## 2022-03-12 DIAGNOSIS — I5032 Chronic diastolic (congestive) heart failure: Secondary | ICD-10-CM | POA: Diagnosis not present

## 2022-03-12 DIAGNOSIS — I421 Obstructive hypertrophic cardiomyopathy: Secondary | ICD-10-CM | POA: Diagnosis not present

## 2022-03-12 DIAGNOSIS — S31103A Unspecified open wound of abdominal wall, right lower quadrant without penetration into peritoneal cavity, initial encounter: Secondary | ICD-10-CM | POA: Diagnosis not present

## 2022-03-12 DIAGNOSIS — Z9884 Bariatric surgery status: Secondary | ICD-10-CM | POA: Diagnosis not present

## 2022-03-12 DIAGNOSIS — I110 Hypertensive heart disease with heart failure: Secondary | ICD-10-CM | POA: Diagnosis not present

## 2022-03-12 DIAGNOSIS — S31109A Unspecified open wound of abdominal wall, unspecified quadrant without penetration into peritoneal cavity, initial encounter: Secondary | ICD-10-CM | POA: Diagnosis not present

## 2022-03-12 DIAGNOSIS — I2510 Atherosclerotic heart disease of native coronary artery without angina pectoris: Secondary | ICD-10-CM | POA: Diagnosis not present

## 2022-03-12 NOTE — Progress Notes (Signed)
ONEDA, DUFFETT ([id number]) [id number] Page 1 of 8 Visit Report for 03/12/2022 Arrival Information Details Patient Name: Date of Service: Cheyenne Gray DIE M. 03/12/2022 10:45 A M Medical Record Number: [id number] Patient Account Number: [id number] Date of Birth/Sex: Treating RN: 06/10/1965 (56 y.o. Skip Mayer Primary Care Gordie Crumby: McLean-Scocuzza, French Ana Other Clinician: Referring Aibhlinn Kalmar: Treating Margurette Brener/Extender: Chauncey Mann, [name], [name] in Treatment: 9 Visit Information History Since Last Visit Added or deleted any medications: No Patient Arrived: Ambulatory Has Dressing in Place as Prescribed: Yes Arrival Time: 10:36 Pain Present Now: No Accompanied By: self Transfer Assistance: None Patient Identification Verified: Yes Secondary Verification Process Completed: Yes Patient Requires Transmission-Based Precautions: No Patient Has Alerts: No Electronic Signature(s) Signed: 03/12/2022 12:29:49 PM By: Elliot Gurney, BSN, RN, CWS, Kim RN, BSN Entered By: Elliot Gurney, BSN, RN, CWS, Kim on 03/12/2022 10:43:40 -------------------------------------------------------------------------------- Clinic Level of Care Assessment Details Patient Name: Date of Service: [facility] DIE M. 03/12/2022 10:45 A M Medical Record Number: [id number] Patient Account Number: [id number] Date of Birth/Sex: Treating RN: 1965-06-18 (56 y.o. Skip Mayer Primary Care Sherrise Liberto: McLean-Scocuzza, French Ana Other Clinician: Referring Arelys Glassco: Treating Lorance Pickeral/Extender: RO BSO N, [name], [name] in Treatment: 9 Clinic Level of Care Assessment Items TOOL 4 Quantity Score []  - 0 Use when only an EandM is performed on FOLLOW-UP visit ASSESSMENTS - Nursing Assessment / Reassessment X- 1 10 Reassessment of Co-morbidities (includes updates in patient status) X- 1 5 Reassessment of Adherence to Treatment Plan ASSESSMENTS - Wound and Skin A  ssessment / Reassessment X - Simple Wound Assessment / Reassessment - one wound 1 5 []  - 0 Complex Wound Assessment / Reassessment - multiple wounds KIYARA, BOUFFARD ( ) [id number] Page 2 of 8 []  - 0 Dermatologic / Skin Assessment (not related to wound area) ASSESSMENTS - Focused Assessment []  - 0 Circumferential Edema Measurements - multi extremities []  - 0 Nutritional Assessment / Counseling / Intervention []  - 0 Lower Extremity Assessment (monofilament, tuning fork, pulses) []  - 0 Peripheral Arterial Disease Assessment (using hand held doppler) ASSESSMENTS - Ostomy and/or Continence Assessment and Care []  - 0 Incontinence Assessment and Management []  - 0 Ostomy Care Assessment and Management (repouching, etc.) PROCESS - Coordination of Care X - Simple Patient / Family Education for ongoing care 1 15 []  - 0 Complex (extensive) Patient / Family Education for ongoing care X- 1 10 Staff obtains [id number], Records, T Results / Process Orders est []  - 0 Staff telephones HHA, Nursing Homes / Clarify orders / etc []  - 0 Routine Transfer to another Facility (non-emergent condition) []  - 0 Routine Hospital Admission (non-emergent condition) []  - 0 New Admissions / 09-15-1976 / Ordering NPWT Apligraf, etc. , []  - 0 Emergency Hospital Admission (emergent condition) X- 1 10 Simple Discharge Coordination []  - 0 Complex (extensive) Discharge Coordination PROCESS - Special Needs []  - 0 Pediatric / Minor Patient Management []  - 0 Isolation Patient Management []  - 0 Hearing / Language / Visual special needs []  - 0 Assessment of Community assistance (transportation, D/C planning, etc.) []  - 0 Additional assistance / Altered mentation []  - 0 Support Surface(s) Assessment (bed, cushion, seat, etc.) INTERVENTIONS - Wound Cleansing / Measurement X - Simple Wound Cleansing - one wound 1 5 []  - 0 Complex Wound Cleansing - multiple  wounds X- 1 5 Wound Imaging (photographs - any number of wounds) []  - 0 Wound Tracing (instead of photographs) X- 1 5 Simple Wound  Measurement - one wound []  - 0 Complex Wound Measurement - multiple wounds INTERVENTIONS - Wound Dressings []  - 0 Small Wound Dressing one or multiple wounds []  - 0 Medium Wound Dressing one or multiple wounds []  - 0 Large Wound Dressing one or multiple wounds []  - 0 Application of Medications - topical []  - 0 Application of Medications - injection INTERVENTIONS - Miscellaneous []  - 0 External ear exam []  - 0 Specimen Collection (cultures, biopsies, blood, body fluids, etc.) []  - 0 Specimen(s) / Culture(s) sent or taken to Lab for analysis Altamese DillingHOMPSON, Caprisha M ([id number]) [id number] Page 3 of 8 []  - 0 Patient Transfer (multiple staff / Nurse, adultHoyer Lift / Similar devices) []  - 0 Simple Staple / Suture removal (25 or less) []  - 0 Complex Staple / Suture removal (26 or more) []  - 0 Hypo / Hyperglycemic Management (close monitor of Blood Glucose) []  - 0 Ankle / Brachial Index (ABI) - do not check if billed separately X- 1 5 Vital Signs Has the patient been seen at the hospital within the last three years: Yes Total Score: 75 Level Of Care: New/Established - Level 2 Electronic Signature(s) Signed: 03/12/2022 12:29:49 PM By: Elliot GurneyWoody, BSN, RN, CWS, Kim RN, BSN Entered By: Elliot GurneyWoody, BSN, RN, CWS, Kim on 03/12/2022 11:07:09 -------------------------------------------------------------------------------- Encounter Discharge Information Details Patient Name: Date of Service: Cheyenne GentryHO MPSO N, JO DIE M. 03/12/2022 10:45 A M Medical Record Number: [id number] Patient Account Number: [id number] Date of Birth/Sex: Treating RN: 1965-04-18 (56 y.o. Skip MayerF) Woody, Kim Primary Care Raymie Giammarco: McLean-Scocuzza, French Anaracy Other Clinician: Referring Malyiah Fellows: Treating Kaleiah Kutzer/Extender: RO BSO Dorris CarnesN, [name], Delmer Islamracy Weeks in Treatment:  9 Encounter Discharge Information Items Discharge Condition: Stable Ambulatory Status: Ambulatory Discharge Destination: Home Transportation: Private Auto Schedule Follow-up Appointment: Yes Clinical Summary of Care: Electronic Signature(s) Signed: 03/12/2022 12:29:49 PM By: Elliot GurneyWoody, BSN, RN, CWS, Kim RN, BSN Entered By: Elliot GurneyWoody, BSN, RN, CWS, Kim on 03/12/2022 11:08:23 -------------------------------------------------------------------------------- Lower Extremity Assessment Details [facility]: [facility] 03/12/2022 10:45 A M Medical Record Number: [id number] Patient Account Number: [id number] Date of Birth/Sex: Treating RN: 1965-04-18 (56 y.o. Skip MayerF) Woody, Kim Primary Care Kyandre Okray: McLean-Scocuzza, French Anaracy Other Clinician: Referring Tyese Finken: Treating Mykale Gandolfo/Extender: Chauncey MannO BSO N, [name], Delmer Islamracy Weeks in Treatment: [address] Zuver, Gail M ([id number]) [id number] Page 4 of 8 Electronic Signature(s) Signed: 03/12/2022 12:29:49 PM By: Elliot GurneyWoody, BSN, RN, CWS, Kim RN, BSN Entered By: Elliot GurneyWoody, BSN, RN, CWS, Kim on 03/12/2022 11:03:40 -------------------------------------------------------------------------------- Multi Wound Chart Details Patient Name: Date of Service: Cheyenne GentryHO MPSO N, JO DIE M. 03/12/2022 10:45 A M Medical Record Number: [id number] Patient Account Number: [id number] Date of Birth/Sex: Treating RN: 1965-04-18 (56 y.o. Skip MayerF) Woody, Kim Primary Care Abem Shaddix: McLean-Scocuzza, French Anaracy Other Clinician: Referring Seylah Wernert: Treating Trason Shifflet/Extender: RO BSO N, [name], Delmer Islamracy Weeks in Treatment: 9 Vital Signs Height(in): 65 Pulse(bpm): 61 Weight(lbs): 270 Blood Pressure(mmHg): 132/67 Body Mass Index(BMI): 44.9 Temperature(F): 97.6 Respiratory Rate(breaths/min): 16 [1:Photos:] [N/A:N/A] Right, Distal Abdomen - Lower N/A N/A Wound Location: Quadrant Gradually Appeared N/A N/A Wounding  Event: Lesion N/A N/A Primary Etiology: Asthma, Arrhythmia, Congestive Heart N/A N/A Comorbid History: Failure, Coronary Artery Disease, Hypertension, Peripheral Venous Disease 12/15/2021 N/A N/A Date Acquired: 9 N/A N/A Weeks of Treatment: Healed - Epithelialized N/A N/A Wound Status: No N/A N/A Wound Recurrence: 0x0x0 N/A N/A Measurements L x W x D (cm) 0 N/A N/A A (cm) : rea 0 N/A N/A Volume (cm) : 100.00% N/A N/A % Reduction in  Area: 100.00% N/A N/A % Reduction in Volume: Full Thickness Without Exposed N/A N/A Classification: Support Structures Medium N/A N/A Exudate Amount: Serosanguineous N/A N/A Exudate Type: red, brown N/A N/A Exudate Color: None Present (0%) N/A N/A Granulation Amount: None Present (0%) N/A N/A Necrotic Amount: Fascia: No N/A N/A Exposed Structures: Fat Layer (Subcutaneous Tissue): No Tendon: No Muscle: No Joint: No Bone: No Large (67-100%) N/A N/A EpithelializationVEE, BAHE ([id number]) [id number] Page 5 of 8 Treatment Notes Electronic Signature(s) Signed: 03/12/2022 12:29:49 PM By: Elliot Gurney, BSN, RN, CWS, Kim RN, BSN Entered By: Elliot Gurney, BSN, RN, CWS, Kim on 03/12/2022 11:06:28 -------------------------------------------------------------------------------- Multi-Disciplinary Care Plan Details Patient Name: Date of Service: [facility] DIE M. 03/12/2022 10:45 A M Medical Record Number: [id number] Patient Account Number: [id number] Date of Birth/Sex: Treating RN: Oct 09, 1965 (56 y.o. Skip Mayer Primary Care Ramsie Ostrander: McLean-Scocuzza, French Ana Other Clinician: Referring Lionardo Haze: Treating Leni Pankonin/Extender: Chauncey Mann, [name], [name] in Treatment: 9 Active Inactive Electronic Signature(s) Signed: 03/12/2022 12:29:49 PM By: Elliot Gurney, BSN, RN, CWS, Kim RN, BSN Entered By: Elliot Gurney, BSN, RN, CWS, Kim on 03/12/2022  11:07:56 -------------------------------------------------------------------------------- Pain Assessment Details Patient Name: Date of Service: Cheyenne Gray DIE M. 03/12/2022 10:45 A M Medical Record Number: [id number] Patient Account Number: [id number] Date of Birth/Sex: Treating RN: Jul 16, 1965 (56 y.o. Skip Mayer Primary Care Samarrah Tranchina: McLean-Scocuzza, French Ana Other Clinician: Referring Latashia Koch: Treating Korissa Horsford/Extender: Chauncey Mann, [name], [name] in Treatment: 9 Active Problems Location of Pain Severity and Description of Pain Patient Has Paino No Site Locations LETINA, LUCKETT Woodson ([id number]) [id number] Page 6 of 8 Pain Management and Medication Current Pain Management: Notes Patient denies pain at this time. Electronic Signature(s) Signed: 03/12/2022 12:29:49 PM By: Elliot Gurney, BSN, RN, CWS, Kim RN, BSN Entered By: Elliot Gurney, BSN, RN, CWS, Kim on 03/12/2022 10:46:15 -------------------------------------------------------------------------------- Patient/Caregiver Education Details Patient Name: Date of Service: Cheyenne Gray DIE M. 12/27/2023andnbsp10:45 A M Medical Record Number: [id number] Patient Account Number: [id number] Date of Birth/Gender: Treating RN: 08-28-1965 (56 y.o. Skip Mayer Primary Care Physician: McLean-Scocuzza, French Ana Other Clinician: Referring Physician: Treating Physician/Extender: Chauncey Mann, [name], [name] in Treatment: 9 Education Assessment Education Provided To: Patient Education Topics Provided Pressure: Handouts: Other: Do not wear tight clothing on this area. Methods: Explain/Verbal Responses: State content correctly Electronic Signature(s) Signed: 03/12/2022 12:29:49 PM By: Elliot Gurney, BSN, RN, CWS, Kim RN, BSN Entered By: Elliot Gurney, BSN, RN, CWS, Kim on 03/12/2022 11:07:44 [name] ([id number]) [id number] Page 7 of  8 -------------------------------------------------------------------------------- Wound Assessment Details Patient Name: Date of Service: Cheyenne Gray DIE M. 03/12/2022 10:45 A M Medical Record Number: [id number] Patient Account Number: [id number] Date of Birth/Sex: Treating RN: 04-18-1965 (56 y.o. Skip Mayer Primary Care Caliyah Sieh: McLean-Scocuzza, French Ana Other Clinician: Referring Dajana Gehrig: Treating Andrea Colglazier/Extender: RO BSO Dorris Carnes, [name], [name] in Treatment: 9 Wound Status Wound Number: 1 Primary Lesion Etiology: Wound Location: Right, Distal Abdomen - Lower Quadrant Wound Healed - Epithelialized Wounding Event: Gradually Appeared Status: Date Acquired: 12/15/2021 Comorbid Asthma, Arrhythmia, Congestive Heart Failure, Coronary Artery Weeks Of Treatment: 9 History: Disease, Hypertension, Peripheral Venous Disease Clustered Wound: No Photos Wound Measurements Length: (cm) Width: (cm) Depth: (cm) Area: (cm) Volume: (cm) 0 % Reduction in Area: 100% 0 % Reduction in Volume: 100% 0 Epithelialization: Large (67-100%) 0 0 Wound Description Classification: Full Thickness Without Exposed Support Exudate Amount: Medium Exudate Type: Serosanguineous Exudate Color: red, brown Structures Foul  Odor After Cleansing: No Slough/Fibrino Yes Wound Bed Granulation Amount: None Present (0%) Exposed Structure Necrotic Amount: None Present (0%) Fascia Exposed: No Fat Layer (Subcutaneous Tissue) Exposed: No Tendon Exposed: No Muscle Exposed: No Joint Exposed: No Bone Exposed: No Treatment Notes Wound #1 (Abdomen - Lower Quadrant) Wound Laterality: Right, Distal Cleanser Peri-Wound Care Topical ADILYN, HUMES ([id number]) [id number] Page 8 of 8 Primary Dressing Secondary Dressing Secured With Compression Wrap Compression Stockings Add-Ons Electronic Signature(s) Signed: 03/12/2022 12:29:49 PM By: Elliot Gurney, BSN, RN, CWS, Kim  RN, BSN Entered By: Elliot Gurney, BSN, RN, CWS, Kim on 03/12/2022 11:06:20 -------------------------------------------------------------------------------- Vitals Details Patient Name: Date of Service: Cheyenne Gray DIE M. 03/12/2022 10:45 A M Medical Record Number: [id number] Patient Account Number: [id number] Date of Birth/Sex: Treating RN: September 14, 1965 (56 y.o. Skip Mayer Primary Care Alejandrina Raimer: McLean-Scocuzza, French Ana Other Clinician: Referring Lavella Myren: Treating Soua Lenk/Extender: RO BSO Dorris Carnes, [name], [name] in Treatment: 9 Vital Signs Time Taken: 10:43 Temperature (F): 97.6 Height (in): 65 Pulse (bpm): 61 Weight (lbs): 270 Respiratory Rate (breaths/min): 16 Body Mass Index (BMI): 44.9 Blood Pressure (mmHg): 132/67 Reference Range: 80 - 120 mg / dl Electronic Signature(s) Signed: 03/12/2022 12:29:49 PM By: Elliot Gurney, BSN, RN, CWS, Kim RN, BSN Entered By: Elliot Gurney, BSN, RN, CWS, Kim on 03/12/2022 10:45:35

## 2022-03-12 NOTE — Progress Notes (Signed)
ALBANA, SAPERSTEIN ([id number]) [id number] Page 1 of 5 Visit Report for 03/12/2022 HPI Details Patient Name: Date of Service: Cheyenne Gray DIE M. 03/12/2022 10:45 A M Medical Record Number: [id number] Patient Account Number: [id number] Date of Birth/Sex: Treating RN: [birth year] (56 y.o. Skip Mayer Primary Care Provider: McLean-Scocuzza, French Ana Other Clinician: Referring Provider: Treating Provider/Extender: Chauncey Mann, [name], [name] in Treatment: 9 History of Present Illness HPI Description: ADMISSION 01/08/2022 This is a 57 year old woman who has been dealing with a wound on her right lateral flank/abdomen for about a month. She was seen in the ER on 11/30/2021 with an open sore I presume the same area. At that time it was draining greenish fluid and was felt to be possibly infected she received a course of the doxycycline without much improvement. She has been using mupirocin topically changing the dressing daily. She tells me that she has been dealing with the consequences of had extensive left lower extremity fracture about a year ago. This is left her with somewhat problems with mobility. 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 Past medical history is really quite extensive and includes heart failure with preserved ejection fraction, PTSD, low back pain, hypertrophic obstructive cardiomyopathy, status post bariatric surgery greater than 20 years ago and an abdominoplasty about 10 to 12 years ago, psoriasis, iron deficiency, long QT, hypertension and PAD 11/1; patient arrives back in clinic with a wound considerably smaller and with a healthy surface she has been using Hydrofera Blue and religiously offloading 11/8; patient presents for follow-up. She has been using Hydrofera Blue without issues. 11/22; patient presents for follow-up. She has been using Hydrofera Blue. The wound has reopened. She denies signs of  infection. 12/6; patient presents for follow-up. She has been using silver alginate to the wound bed. She just obtained Medihoney yesterday. She has no issues or complaints today. She tries to offload the area. 12/27; the patient arrives in clinic with a wound on the right flank closed. The exact cause of this is not clear. She had a remote panniculectomy and there is scars in this area but the exact reason behind this was not exactly clear Electronic Signature(s) Signed: 03/12/2022 4:44:55 PM By: Baltazar Najjar MD Entered By: Baltazar Najjar on 03/12/2022 12:06:29 -------------------------------------------------------------------------------- Physical Exam Details Patient Name: Date of Service: Cheyenne Gray DIE M. 03/12/2022 10:45 A M Medical Record Number: [id number] Patient Account Number: [id number] Date of Birth/Sex: Treating RN: [birth year] (56 y.o. Skip Mayer Primary Care Provider: McLean-Scocuzza, French Ana Other Clinician: Referring Provider: Treating Provider/Extender: Chauncey Mann, [name], [name] in Treatment: [address] [place] ([id number]) [id number] Page 2 of 5 Constitutional Sitting or standing Blood Pressure is within target range for patient.. Pulse regular and within target range for patient.Marland Kitchen Respirations regular, non-labored and within target range.. Temperature is normal and within the target range for the patient.Marland Kitchen appears in no distress. Notes Wound exam; right lateral flank the area is closed. She had a similar area on the left and she showed [place] the results of that there is no open wound there either. Electronic Signature(s) Signed: 03/12/2022 4:44:55 PM By: Baltazar Najjar MD Entered By: Baltazar Najjar on 03/12/2022 12:07:51 -------------------------------------------------------------------------------- Physician Orders Details Patient Name: Date of Service: Cheyenne Gray DIE M. 03/12/2022 10:45 A M Medical  Record Number: [id number] Patient Account Number: [id number] Date of Birth/Sex: Treating RN: 1966/03/14 (56 y.o. Skip Mayer Primary Care Provider: McLean-Scocuzza,  French Ana Other Clinician: Referring Provider: Treating Provider/Extender: RO BSO Dorris Carnes, [name], [name] in Treatment: 9 Verbal / Phone Orders: No Diagnosis Coding Discharge From [facility] Services Discharge from Wound Care Center Treatment Complete Electronic Signature(s) Signed: 03/12/2022 12:29:49 PM By: Elliot Gurney BSN, RN, CWS, Kim RN, BSN Signed: 03/12/2022 4:44:55 PM By: Baltazar Najjar MD Entered By: Elliot Gurney, BSN, RN, CWS, Kim on 03/12/2022 11:06:44 -------------------------------------------------------------------------------- Problem List Details Patient Name: Date of Service: Cheyenne Gray DIE M. 03/12/2022 10:45 A M Medical Record Number: [id number] Patient Account Number: [id number] Date of Birth/Sex: Treating RN: [birth year] (56 y.o. Skip Mayer Primary Care Provider: McLean-Scocuzza, French Ana Other Clinician: Referring Provider: Treating Provider/Extender: Chauncey Mann, [name], [name] in Treatment: 9 Active Problems ICD-10 Encounter Code Description Active Date MDM TERESSA, MCGLOCKLIN ([id number]) [id number] Page 3 of 5 Code Description Active Date MDM Diagnosis S31.103D Unspecified open wound of abdominal wall, right lower quadrant without 01/08/2022 No Yes penetration into peritoneal cavity, subsequent encounter E66.8 Other obesity 01/08/2022 No Yes Inactive Problems Resolved Problems Electronic Signature(s) Signed: 03/12/2022 4:44:55 PM By: Baltazar Najjar MD Entered By: Baltazar Najjar on 03/12/2022 12:05:20 -------------------------------------------------------------------------------- Progress Note Details Patient Name: Date of Service: Cheyenne Gray DIE M. 03/12/2022 10:45 A M Medical Record Number: [id number] Patient Account Number:  [id number] Date of Birth/Sex: Treating RN: 11/14/1965 (56 y.o. Skip Mayer Primary Care Provider: McLean-Scocuzza, French Ana Other Clinician: Referring Provider: Treating Provider/Extender: Chauncey Mann, [name], [name] in Treatment: 9 Subjective History of Present Illness (HPI) ADMISSION 01/08/2022 This is a 56 year old woman who has been dealing with a wound on her right lateral flank/abdomen for about a month. She was seen in the ER on 11/30/2021 with an open sore I presume the same area. At that time it was draining greenish fluid and was felt to be possibly infected she received a course of the doxycycline without much improvement. She has been using mupirocin topically changing the dressing daily. She tells me that she has been dealing with the consequences of had extensive left lower extremity fracture about a year ago. This is left her with somewhat problems with mobility. 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 Past medical history is really quite extensive and includes heart failure with preserved ejection fraction, PTSD, low back pain, hypertrophic obstructive cardiomyopathy, status post bariatric surgery greater than 20 years ago and an abdominoplasty about 10 to 12 years ago, psoriasis, iron deficiency, long QT, hypertension and PAD 11/1; patient arrives back in clinic with a wound considerably smaller and with a healthy surface she has been using Hydrofera Blue and religiously offloading 11/8; patient presents for follow-up. She has been using Hydrofera Blue without issues. 11/22; patient presents for follow-up. She has been using Hydrofera Blue. The wound has reopened. She denies signs of infection. 12/6; patient presents for follow-up. She has been using silver alginate to the wound bed. She just obtained Medihoney yesterday. She has no issues or complaints today. She tries to offload the area. 12/27; the patient arrives in clinic with a wound on the  right flank closed. The exact cause of this is not clear. She had a remote panniculectomy and there is scars in this area but the exact reason behind this was not exactly clear Objective HUSNA, KRONE ([id number]) [id number] Page 4 of 5 Constitutional Sitting or standing Blood Pressure is within target range for patient.. Pulse regular and within target range for patient.Marland Kitchen Respirations  regular, non-labored and within target range.. Temperature is normal and within the target range for the patient.Marland Kitchen appears in no distress. Vitals Time Taken: 10:43 AM, Height: 65 in, Weight: 270 lbs, BMI: 44.9, Temperature: 97.6 F, Pulse: 61 bpm, Respiratory Rate: 16 breaths/min, Blood Pressure: 132/67 mmHg. General Notes: Wound exam; right lateral flank the area is closed. She had a similar area on the left and she showed [place] the results of that there is no open wound there either. Integumentary (Hair, Skin) Wound #1 status is Healed - Epithelialized. Original cause of wound was Gradually Appeared. The date acquired was: 12/15/2021. The wound has been in treatment 9 weeks. The wound is located on the Right,Distal Abdomen - Lower Quadrant. The wound measures 0cm length x 0cm width x 0cm depth; 0cm^2 area and 0cm^3 volume. There is a medium amount of serosanguineous drainage noted. There is no granulation within the wound bed. There is no necrotic tissue within the wound bed. Assessment Active Problems ICD-10 Unspecified open wound of abdominal wall, right lower quadrant without penetration into peritoneal cavity, subsequent encounter Other obesity Plan Discharge From [facility] Services: Discharge from Wound Care Center Treatment Complete 1. Everything is closed here. 2. The exact reason for these wounds is not clear. Possible excoriation from her undergarments or her waistband of her pants I advised her not to do this and to adjust her clothing accordingly Electronic  Signature(s) Signed: 03/12/2022 4:44:55 PM By: Baltazar Najjar MD Entered By: Baltazar Najjar on 03/12/2022 12:08:36 -------------------------------------------------------------------------------- SuperBill Details Patient Name: Date of Service: Cheyenne Gray DIE M. 03/12/2022 Medical Record Number: [id number] Patient Account Number: [id number] Date of Birth/Sex: Treating RN: [birth year] (56 y.o. Skip Mayer Primary Care Provider: McLean-Scocuzza, French Ana Other Clinician: Referring Provider: Treating Provider/Extender: Chauncey Mann, [name], [name] in Treatment: 9 Diagnosis Coding ICD-10 Codes Code Description S31.103D Unspecified open wound of abdominal wall, right lower quadrant without penetration into peritoneal cavity, subsequent encounter LINNET, BOTTARI ([id number]) [id number] Page 5 of 5 E66.8 Other obesity Facility Procedures : CPT4 Code: [id number] Description: [phone] - WOUND CARE VISIT-LEV 2 EST PT Modifier: Quantity: 1 Physician Procedures : CPT4 Code Description Modifier [id number] [phone] - WC PHYS LEVEL 2 - EST PT ICD-10 Diagnosis Description S31.103D Unspecified open wound of abdominal wall, right lower quadrant without penetration into peritoneal cavi subsequent encounter Quantity: 1 ty, Electronic Signature(s) Signed: 03/12/2022 4:44:55 PM By: Baltazar Najjar MD Entered By: Baltazar Najjar on 03/12/2022 12:08:56

## 2022-03-14 ENCOUNTER — Other Ambulatory Visit: Payer: Self-pay

## 2022-03-14 MED ORDER — ALBUTEROL SULFATE HFA 108 (90 BASE) MCG/ACT IN AERS: 1.0000 | INHALATION_SPRAY | Freq: Four times a day (QID) | RESPIRATORY_TRACT | 3 refills | Status: DC | PRN

## 2022-03-14 NOTE — Telephone Encounter (Signed)
Rx refill for albuterol HFA came via fax from [facility].   Pt has a TOC appt with [facility] in 05-28-22  Refill has been sent,

## 2022-03-21 ENCOUNTER — Encounter: Payer: Self-pay | Admitting: Psychiatry

## 2022-03-21 ENCOUNTER — Ambulatory Visit (INDEPENDENT_AMBULATORY_CARE_PROVIDER_SITE_OTHER): Payer: Medicare Other | Admitting: Psychiatry

## 2022-03-21 VITALS — BP 134/83 | HR 62 | Temp 98.8°F | Ht 66.0 in | Wt 275.8 lb

## 2022-03-21 DIAGNOSIS — F1590 Other stimulant use, unspecified, uncomplicated: Secondary | ICD-10-CM

## 2022-03-21 DIAGNOSIS — F411 Generalized anxiety disorder: Secondary | ICD-10-CM | POA: Diagnosis not present

## 2022-03-21 DIAGNOSIS — F4310 Post-traumatic stress disorder, unspecified: Secondary | ICD-10-CM

## 2022-03-21 DIAGNOSIS — F331 Major depressive disorder, recurrent, moderate: Secondary | ICD-10-CM

## 2022-03-21 DIAGNOSIS — G4701 Insomnia due to medical condition: Secondary | ICD-10-CM | POA: Diagnosis not present

## 2022-03-21 DIAGNOSIS — Z91148 Patient's other noncompliance with medication regimen for other reason: Secondary | ICD-10-CM

## 2022-03-21 MED ORDER — VENLAFAXINE HCL ER 75 MG PO CP24: 75.0000 mg | ORAL_CAPSULE | Freq: Every day | ORAL | 0 refills | Status: DC
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
## 2022-03-26 ENCOUNTER — Telehealth: Payer: Self-pay

## 2022-03-26 ENCOUNTER — Other Ambulatory Visit: Payer: Self-pay

## 2022-03-26 ENCOUNTER — Other Ambulatory Visit: Payer: Self-pay | Admitting: Family

## 2022-03-26 MED ORDER — ALBUTEROL SULFATE HFA 108 (90 BASE) MCG/ACT IN AERS: 1.0000 | INHALATION_SPRAY | Freq: Four times a day (QID) | RESPIRATORY_TRACT | 3 refills | Status: DC | PRN

## 2022-03-26 NOTE — Telephone Encounter (Signed)
Rx refill for albuterol inhaler was received via fax for pt. A warning popped up when tried to send in refill.   Is refill okay to send in ?  Pt has a TOC with Volanda Napoleon: 05-28-22

## 2022-03-28 ENCOUNTER — Encounter: Payer: Self-pay | Admitting: Oncology

## 2022-03-31 ENCOUNTER — Ambulatory Visit (INDEPENDENT_AMBULATORY_CARE_PROVIDER_SITE_OTHER): Payer: Medicare Other | Admitting: Licensed Clinical Social Worker

## 2022-03-31 DIAGNOSIS — Z91198 Patient's noncompliance with other medical treatment and regimen for other reason: Secondary | ICD-10-CM

## 2022-03-31 NOTE — Progress Notes (Signed)
Attempted pt virtual session--audio was not clear. LCSW clinician suggested that pt log out and log back into virtual session to see if that would improve audio. When pt logged back in there was no audio on HER end.   LCSW clinician attempted to transfer video session into audio session--informed pt during the video session and via chat that clinician would be calling pt.  Phone calls were not answered and went straight to voice mail.  LCSW clinician left two messages stating that the appointment would be held for pt until it would be no longer billable (30 min).  At 2:30pm administrative staff informed to cancel session. Session will not be billed as a no show since technology was the issue and not pt compliance.

## 2022-04-03 ENCOUNTER — Other Ambulatory Visit: Payer: Self-pay | Admitting: Cardiology

## 2022-04-03 DIAGNOSIS — R55 Syncope and collapse: Secondary | ICD-10-CM

## 2022-04-03 DIAGNOSIS — I2510 Atherosclerotic heart disease of native coronary artery without angina pectoris: Secondary | ICD-10-CM

## 2022-04-04 ENCOUNTER — Encounter: Payer: Self-pay | Admitting: Oncology
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
## 2022-04-07 ENCOUNTER — Ambulatory Visit: Payer: Medicare Other | Admitting: Cardiovascular Disease

## 2022-04-07 DIAGNOSIS — S82142A Displaced bicondylar fracture of left tibia, initial encounter for closed fracture: Secondary | ICD-10-CM | POA: Diagnosis not present

## 2022-04-09 ENCOUNTER — Telehealth: Payer: Self-pay

## 2022-04-09 DIAGNOSIS — M5440 Lumbago with sciatica, unspecified side: Secondary | ICD-10-CM | POA: Diagnosis not present

## 2022-04-09 DIAGNOSIS — G894 Chronic pain syndrome: Secondary | ICD-10-CM | POA: Diagnosis not present

## 2022-04-09 DIAGNOSIS — Z79891 Long term (current) use of opiate analgesic: Secondary | ICD-10-CM | POA: Diagnosis not present

## 2022-04-09 DIAGNOSIS — M791 Myalgia, unspecified site: Secondary | ICD-10-CM | POA: Diagnosis not present

## 2022-04-09 DIAGNOSIS — I10 Essential (primary) hypertension: Secondary | ICD-10-CM | POA: Diagnosis not present

## 2022-04-09 DIAGNOSIS — G8921 Chronic pain due to trauma: Secondary | ICD-10-CM | POA: Diagnosis not present

## 2022-04-09 DIAGNOSIS — M542 Cervicalgia: Secondary | ICD-10-CM | POA: Diagnosis not present

## 2022-04-09 NOTE — Telephone Encounter (Signed)
Left detailed msg on pts phone in regards to her appt on tomorrow with Ollen Gross.   Ollen Gross stated if its for a concussion f/u pt needs to go to ER where a CT can and other imagine may be done.

## 2022-04-10 ENCOUNTER — Ambulatory Visit: Payer: Medicare Other | Admitting: Nurse Practitioner

## 2022-04-14 ENCOUNTER — Encounter: Payer: Self-pay | Admitting: Family Medicine

## 2022-04-25 ENCOUNTER — Other Ambulatory Visit: Payer: Self-pay | Admitting: Cardiology

## 2022-04-25 DIAGNOSIS — R55 Syncope and collapse: Secondary | ICD-10-CM

## 2022-04-25 DIAGNOSIS — I2510 Atherosclerotic heart disease of native coronary artery without angina pectoris: Secondary | ICD-10-CM

## 2022-05-05 ENCOUNTER — Other Ambulatory Visit: Payer: Self-pay | Admitting: Cardiovascular Disease

## 2022-05-05 DIAGNOSIS — R55 Syncope and collapse: Secondary | ICD-10-CM

## 2022-05-05 DIAGNOSIS — I2510 Atherosclerotic heart disease of native coronary artery without angina pectoris: Secondary | ICD-10-CM

## 2022-05-08 DIAGNOSIS — S82142A Displaced bicondylar fracture of left tibia, initial encounter for closed fracture: Secondary | ICD-10-CM | POA: Diagnosis not present
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
## 2022-05-15 ENCOUNTER — Encounter: Payer: Self-pay | Admitting: Radiology

## 2022-05-16 ENCOUNTER — Encounter: Payer: Self-pay | Admitting: Cardiovascular Disease

## 2022-05-16 ENCOUNTER — Ambulatory Visit: Payer: Medicare Other | Attending: Cardiovascular Disease | Admitting: Cardiovascular Disease

## 2022-05-16 VITALS — BP 110/70 | HR 52 | Ht 65.0 in | Wt 273.5 lb

## 2022-05-16 DIAGNOSIS — I10 Essential (primary) hypertension: Secondary | ICD-10-CM

## 2022-05-16 DIAGNOSIS — I4729 Other ventricular tachycardia: Secondary | ICD-10-CM | POA: Diagnosis not present

## 2022-05-16 DIAGNOSIS — I5032 Chronic diastolic (congestive) heart failure: Secondary | ICD-10-CM

## 2022-05-16 DIAGNOSIS — R55 Syncope and collapse: Secondary | ICD-10-CM

## 2022-05-16 DIAGNOSIS — I25118 Atherosclerotic heart disease of native coronary artery with other forms of angina pectoris: Secondary | ICD-10-CM

## 2022-05-16 DIAGNOSIS — I2510 Atherosclerotic heart disease of native coronary artery without angina pectoris: Secondary | ICD-10-CM

## 2022-05-16 DIAGNOSIS — I421 Obstructive hypertrophic cardiomyopathy: Secondary | ICD-10-CM | POA: Diagnosis not present

## 2022-05-16 DIAGNOSIS — I209 Angina pectoris, unspecified: Secondary | ICD-10-CM

## 2022-05-16 MED ORDER — ISOSORBIDE MONONITRATE ER 60 MG PO TB24: ORAL_TABLET | ORAL | 2 refills | Status: DC

## 2022-05-16 MED ORDER — FUROSEMIDE 20 MG PO TABS: 20.0000 mg | ORAL_TABLET | Freq: Every day | ORAL | 0 refills | Status: AC | PRN

## 2022-05-16 MED ORDER — CARVEDILOL 6.25 MG PO TABS: 6.2500 mg | ORAL_TABLET | Freq: Two times a day (BID) | ORAL | 2 refills | Status: DC

## 2022-05-16 MED ORDER — ROSUVASTATIN CALCIUM 10 MG PO TABS: ORAL_TABLET | ORAL | 2 refills | Status: DC

## 2022-05-16 NOTE — Patient Instructions (Signed)
Medication Instructions:  No changes  If you need a refill on your cardiac medications before your next appointment, please call your pharmacy.   Lab work: No new labs needed  Testing/Procedures: No new testing needed  Follow-Up: At [facility], you and your health needs are our priority.  As part of our continuing mission to provide you with exceptional heart care, we have created designated Provider Care Teams.  These Care Teams include your primary Cardiologist (physician) and Advanced Practice Providers (APPs -  Physician Assistants and Nurse Practitioners) who all work together to provide you with the care you need, when you need it.  You will need a follow up appointment in 12 months  Providers on your designated Care Team:   Christopher Berge, NP Ryan Dunn, PA-C Cadence Furth, PA-C  COVID-19 Vaccine Information can be found at: [email or web address] For questions related to vaccine distribution or appointments, please email [email or web address] or call [phone].   

## 2022-05-20 ENCOUNTER — Telehealth: Payer: Self-pay

## 2022-05-20 DIAGNOSIS — F4310 Post-traumatic stress disorder, unspecified: Secondary | ICD-10-CM

## 2022-05-20 DIAGNOSIS — G4701 Insomnia due to medical condition: Secondary | ICD-10-CM

## 2022-05-20 NOTE — Telephone Encounter (Signed)
Venlafaxine was last sent to her pharmacy at [facility] on 03/21/2022.  She was provided 90 days supply hence she is not due for it yet.  Her mirtazapine was sent out by her primary care provider for 6 months in September 2023.  Please verify the patient what medication she is talking about.  If she needs a new prescription sent out for any of the above medications it has to be done only when she is due and cannot be sent early.  Please verify and let me know.

## 2022-05-20 NOTE — Telephone Encounter (Signed)
pt states that you was suppose to have sent a medication to [facility] and they don't have a record of any medication sent. pt was last seen on 03-21-22 and next appt 06-20-22

## 2022-05-22 NOTE — Telephone Encounter (Signed)
Please check with [facility] when was the last time prescription was picked up to avoid sending medication sooner than it is required.

## 2022-05-22 NOTE — Telephone Encounter (Signed)
called pharmacy and she states venlafaxine was last picked up in oct the one for jan was never picked up and it was returned to stock. the mirtazapine was last picked up in sept 25th. pt requested her rx's be sent to the mail order. [facility].

## 2022-05-22 NOTE — Telephone Encounter (Signed)
rx was [facility] to [id number] [place]

## 2022-05-23 MED ORDER — MIRTAZAPINE 15 MG PO TABS: ORAL_TABLET | ORAL | 0 refills | Status: DC

## 2022-05-23 MED ORDER — VENLAFAXINE HCL ER 75 MG PO CP24: 75.0000 mg | ORAL_CAPSULE | Freq: Every day | ORAL | 0 refills | Status: DC

## 2022-05-23 NOTE — Telephone Encounter (Signed)
Left message that rx was sent

## 2022-05-23 NOTE — Telephone Encounter (Signed)
I have sent venlafaxine and mirtazapine to pharmacy at [facility]

## 2022-05-27 ENCOUNTER — Other Ambulatory Visit: Payer: Self-pay | Admitting: Cardiovascular Disease

## 2022-05-27 DIAGNOSIS — I2510 Atherosclerotic heart disease of native coronary artery without angina pectoris: Secondary | ICD-10-CM

## 2022-05-27 DIAGNOSIS — R55 Syncope and collapse: Secondary | ICD-10-CM

## 2022-05-28 ENCOUNTER — Encounter: Payer: Medicare Other | Admitting: Family Medicine

## 2022-05-28 ENCOUNTER — Encounter: Payer: Self-pay | Admitting: Cardiology

## 2022-05-28 ENCOUNTER — Other Ambulatory Visit: Payer: Self-pay | Admitting: Cardiovascular Disease

## 2022-06-06 DIAGNOSIS — S82142A Displaced bicondylar fracture of left tibia, initial encounter for closed fracture: Secondary | ICD-10-CM | POA: Diagnosis not present

## 2022-06-11 DIAGNOSIS — M542 Cervicalgia: Secondary | ICD-10-CM | POA: Diagnosis not present

## 2022-06-11 DIAGNOSIS — M791 Myalgia, unspecified site: Secondary | ICD-10-CM | POA: Diagnosis not present

## 2022-06-11 DIAGNOSIS — G894 Chronic pain syndrome: Secondary | ICD-10-CM | POA: Diagnosis not present

## 2022-06-11 DIAGNOSIS — I10 Essential (primary) hypertension: Secondary | ICD-10-CM | POA: Diagnosis not present

## 2022-06-11 DIAGNOSIS — G8921 Chronic pain due to trauma: Secondary | ICD-10-CM | POA: Diagnosis not present

## 2022-06-11 DIAGNOSIS — M5440 Lumbago with sciatica, unspecified side: Secondary | ICD-10-CM | POA: Diagnosis not present

## 2022-06-11 DIAGNOSIS — Z79891 Long term (current) use of opiate analgesic: Secondary | ICD-10-CM | POA: Diagnosis not present

## 2022-06-20 ENCOUNTER — Telehealth (INDEPENDENT_AMBULATORY_CARE_PROVIDER_SITE_OTHER): Payer: Medicare Other | Admitting: Psychiatry

## 2022-06-20 ENCOUNTER — Encounter: Payer: Self-pay | Admitting: Psychiatry

## 2022-06-20 DIAGNOSIS — F3341 Major depressive disorder, recurrent, in partial remission: Secondary | ICD-10-CM | POA: Diagnosis not present

## 2022-06-20 DIAGNOSIS — F4310 Post-traumatic stress disorder, unspecified: Secondary | ICD-10-CM

## 2022-06-20 DIAGNOSIS — F411 Generalized anxiety disorder: Secondary | ICD-10-CM | POA: Diagnosis not present

## 2022-06-20 DIAGNOSIS — G4701 Insomnia due to medical condition: Secondary | ICD-10-CM | POA: Diagnosis not present

## 2022-06-20 DIAGNOSIS — F1590 Other stimulant use, unspecified, uncomplicated: Secondary | ICD-10-CM

## 2022-06-20 NOTE — Progress Notes (Signed)
Virtual Visit via Video Note  I connected with Cheyenne Gray on 06/20/22 at 11:30 AM EDT by a video enabled telemedicine application and verified that I am speaking with the correct person using two identifiers.  Location Provider Location : [facility] Patient Location : Home  Participants: Patient , Provider   I discussed the limitations of evaluation and management by telemedicine and the availability of in person appointments. The patient expressed understanding and agreed to proceed.   I discussed the assessment and treatment plan with the patient. The patient was provided an opportunity to ask questions and all were answered. The patient agreed with the plan and demonstrated an understanding of the instructions.   The patient was advised to call back or seek an in-person evaluation if the symptoms worsen or if the condition fails to improve as anticipated.   BH MD OP Progress Note  06/20/2022 1:02 PM Cheyenne Gray  MRN:  [id number]  Chief Complaint:  Chief Complaint  Patient presents with   Follow-up   Anxiety   Depression   Medication Refill   HPI: Cheyenne Gray is a 57 year old Caucasian female, widowed, on disability, has a history of PTSD, MDD, GAD, insomnia, caffeine use disorder, history of prolonged QT syndrome, hypotrophic cardiomyopathy, vitamin B12 deficiency, multiple other medical problems was evaluated by telemedicine today.  Patient today reports she is currently having flulike symptoms, she is not sure if this is due to seasonal allergies or not.  Patient reports overall mood symptoms has been manageable.  She is currently compliant with the venlafaxine and mirtazapine.  Denies side effects.  Patient however reports she struggles with sleep.  She reports she gets only around 3 to 4 hours of sleep at night.  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  She gets too tired by the  afternoon.  Patient however reports she does have hot flashes.  She is planning to talk to her provider about the new medication that was FDA approved for for hot flashes.  Her hot flashes are also affecting her sleep.  Patient reports she is trying to cut back on her caffeine intake.  Trying to drink more water.  Patient denies any suicidality, homicidality or perceptual disturbances.  Reports she missed her appointment with therapist recently however is motivated to stay in therapy.  Denies any other concerns today.  Visit Diagnosis:    ICD-10-CM   1. PTSD (post-traumatic stress disorder)  F43.10     2. GAD (generalized anxiety disorder)  F41.1     3. Recurrent major depressive disorder, in partial remission  F33.41     4. Insomnia due to medical condition  G47.01    mood, caffeine, hot flashes    5. 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  Past trials of Paxil, Cymbalta, Valium, melatonin, trazodone, Ambien, Wellbutrin, Abilify.  Past Medical History:  Past Medical History:  Diagnosis Date   (HFpEF) heart failure with preserved ejection fraction    a. Echo 2014: EF 65-70%, nl WM, mildly dilated LA, PASP nl; b. 12/2014 Echo: EF 65-70%, no rwma, mod septal hypertrophy w/o LVOT gradient or SAM; c. 07/2017 Echo: EF 55-60%, no rwma, mildly dil RV w/ nl syst fxn. Mildly dil RA. Dilated IVC w/ elevated CVP. Triv post effusion.  Anxiety    Asthma    Chronic pain    on methadone, managed by Dr. Metta Clines   Concussion    hx of 4   Coronary artery disease, non-occlusive    a. LHC 1/18: proximal to mid LAD 40% stenosed, mid LAD 30% stenosed, mid RCA 20% stenosed, distal RCA 20% stenosed, EF 55-65%, LVEDP normal   Depression    DJD (degenerative joint disease), multiple sites    History of shingles    HOCM (hypertrophic obstructive cardiomyopathy)    Hypertension    Iron deficiency  anemia    Long QT interval    Obesity    Palpitations    a. 24 hour Holter: NSR, sinus brady down to 48, occasional PVCs & couplets, 8 beats NSVT; b. 30 day event monitor 2015: NSR with rare PVC.   Psoriasis    Syncope and collapse    Vitamin D deficiency    Wears dentures    full upper and lower    Past Surgical History:  Procedure Laterality Date   ABDOMINOPLASTY     tummy tuck ? year    BARIATRIC SURGERY  2001   CARDIAC CATHETERIZATION Left 04/16/2016   Procedure: Left Heart Cath and Coronary Angiography;  Surgeon: Antonieta Iba, MD;  Location: [facility] INVASIVE CV LAB;  Service: Cardiovascular;  Laterality: Left;   CHOLECYSTECTOMY  2001   COLONOSCOPY WITH PROPOFOL N/A 09/28/2018   Procedure: COLONOSCOPY WITH PROPOFOL;  Surgeon: Pasty Spillers, MD;  Location: [facility];  Service: Endoscopy;  Laterality: N/A;   ESOPHAGOGASTRODUODENOSCOPY (EGD) WITH PROPOFOL N/A 09/28/2018   Procedure: ESOPHAGOGASTRODUODENOSCOPY (EGD) WITH BIOPSY;  Surgeon: Pasty Spillers, MD;  Location: [facility];  Service: Endoscopy;  Laterality: N/A;   EXTERNAL FIXATION LEG Left 10/29/2020   Procedure: EXTERNAL FIXATION LEFT KNEE;  Surgeon: Myrene Galas, MD;  Location: [facility] OR;  Service: Orthopedics;  Laterality: Left;   GALLBLADDER SURGERY     GASTRIC BYPASS  2001   GASTROPLASTY      Family Psychiatric History: Reviewed family psychiatric history from progress note on 11/13/2017.  Family History:  Family History  Problem Relation Age of Onset   Heart attack Mother    Cancer Mother        pancreatitic 42   Early death Mother    Heart attack Father 68       MI   Early death Father    Heart disease Father    Heart attack Brother    Heart disease Brother    Arthritis Brother    Depression Brother    Diabetes Brother    Heart attack Maternal Grandmother    Cancer Maternal Grandmother        pancreatitic    Heart disease Maternal Grandmother    Lung cancer Maternal  Grandmother    Pancreatic cancer Maternal Grandmother    Cancer Maternal Uncle        pancreatitic    Cancer Paternal Grandmother        ? type    Diabetes Paternal Grandmother    Cancer Maternal Uncle        pancreatitic    Breast cancer Maternal Aunt    Cancer Maternal Aunt        GYN   Cancer Maternal Aunt        GYN    Social History: Reviewed social history from progress note on 11/13/2017. Social History   Socioeconomic History   Marital status: Widowed    Spouse  name: Not on file   Number of children: 1   Years of education: assoc degree   Highest education level: Associate degree: occupational, Scientist, product/process development, or vocational program  Occupational History   Not on file  Tobacco Use   Smoking status: Never   Smokeless tobacco: Never  Vaping Use   Vaping Use: Never used  Substance and Sexual Activity   Alcohol use: Not Currently    Comment: holidays   Drug use: No   Sexual activity: Not Currently  Other Topics Concern   Not on file  Social History Narrative   Adopted daughter Morrell Riddle [phone] (now lives in [place] going to [place] has apt there)   Significant other mac [phone], former husband died    Teacher ages 4 and up    Never smoker    No guns   Wears seat belt    No caffeine   Social Determinants of Health   Financial Resource Strain: High Risk (11/06/2017)   Overall Financial Resource Strain (CARDIA)    Difficulty of Paying Living Expenses: Very hard  Food Insecurity: No Food Insecurity (11/06/2017)   Hunger Vital Sign    Worried About Running Out of Food in the Last Year: Never true    Ran Out of Food in the Last Year: Never true  Transportation Needs: No Transportation Needs (11/06/2017)   PRAPARE - Administrator, Civil Service (Medical): No    Lack of Transportation (Non-Medical): No  Physical Activity: Inactive (11/06/2017)   Exercise Vital Sign    Days of Exercise per Week: 0 days    Minutes of Exercise per Session: 0 min   Stress: Stress Concern Present (11/06/2017)   [employer] of Occupational Health - Occupational Stress Questionnaire    Feeling of Stress : Very much  Social Connections: Socially Isolated (11/06/2017)   Social Connection and Isolation Panel [NHANES]    Frequency of Communication with Friends and Family: Once a week    Frequency of Social Gatherings with Friends and Family: Never    Attends Religious Services: Never    Database administrator or Organizations: No    Attends Banker Meetings: Never    Marital Status: Widowed    Allergies:  Allergies  Allergen Reactions   Covid-19 (Mrna) Vaccine Proofreader) [Covid-19 (Mrna) Vaccine] Itching, Palpitations and Other (See Comments)    Throat closing.    Penicillins Hives, Shortness Of Breath and Rash    Has patient had a PCN reaction causing immediate rash, facial/tongue/throat swelling, SOB or lightheadedness with hypotension: Yes Has patient had a PCN reaction causing severe rash involving mucus membranes or skin necrosis: No Has patient had a PCN reaction that required hospitalization No Has patient had a PCN reaction occurring within the last 10 years: No If all of the above answers are "NO", then may proceed with Cephalosporin use.    Bee Venom     Metabolic Disorder Labs: Lab Results  Component Value Date   HGBA1C 5.1 03/01/2021   MPG 100 03/01/2021   No results found for: "PROLACTIN" Lab Results  Component Value Date   CHOL 183 11/26/2021   TRIG 109.0 11/26/2021   HDL 49.00 11/26/2021   CHOLHDL 4 11/26/2021   VLDL 21.8 11/26/2021   LDLCALC 112 (H) 11/26/2021   LDLCALC 62 03/01/2021   Lab Results  Component Value Date   TSH 4.66 11/26/2021   TSH 3.19 04/18/2021    Therapeutic Level Labs: No results found for: "  LITHIUM" No results found for: "VALPROATE" No results found for: "CBMZ"  Current Medications: Current Outpatient Medications  Medication Sig Dispense Refill   albuterol (PROAIR HFA) 108  (90 Base) MCG/ACT inhaler Inhale 1-2 puffs into the lungs every 6 (six) hours as needed for wheezing or shortness of breath. 54 g 3   Biotin 10 MG CAPS Take 10 mg by mouth daily.     carvedilol (COREG) 6.25 MG tablet Take 1 tablet (6.25 mg total) by mouth 2 (two) times daily with a meal. Additional refill available at office visit 180 tablet 2   diclofenac Sodium (VOLTAREN) 1 % GEL Apply topically.     EPINEPHrine 0.3 mg/0.3 mL IJ SOAJ injection Inject 0.3 mg into the muscle as needed for anaphylaxis. 1 each 2   furosemide (LASIX) 20 MG tablet Take 1 tablet (20 mg total) by mouth daily as needed. 90 tablet 0   gabapentin (NEURONTIN) 400 MG capsule LIMIT 2 TABLETS IN THE A.M. AND MIDDAY AND 3 TABLETS EACH EVENING 630 capsule 3   isosorbide mononitrate (IMDUR) 60 MG 24 hr tablet TAKE ONE TABLET (60 mg total) BY MOUTH DAILY AT 9 AM 90 tablet 2   Magnesium Oxide 400 MG CAPS Take 1 capsule (400 mg total) by mouth daily. 30 capsule 6   methadone (DOLOPHINE) 10 MG tablet Limit 1 tablets by mouth 3 times per day if tolerated per methadone clinic 180 tablet 0   mirtazapine (REMERON) 15 MG tablet TAKE 1 TABLET BY MOUTH EVERYDAY AT BEDTIME 90 tablet 0   nitroGLYCERIN (NITROSTAT) 0.4 MG SL tablet PLACE 1 TABLET UNDER THE TONGUE EVERY 5 MINUTES AS NEEDED FOR CHEST PAIN. AFTER 2ND DOSE CALL 911 25 tablet 2   rosuvastatin (CRESTOR) 10 MG tablet TAKE ONE TABLET (10 mg total) BY MOUTH DAILY AT 5 PM 90 tablet 2   Semaglutide-Weight Management (WEGOVY) 2.4 MG/0.75ML SOAJ Inject 2.4 mg into the skin once a week. X 61month (Patient not taking: Reported on 03/21/2022) 3 mL 5   tiZANidine (ZANAFLEX) 4 MG tablet Take 4 mg by mouth 2 (two) times daily.     venlafaxine XR (EFFEXOR XR) 75 MG 24 hr capsule Take 1 capsule (75 mg total) by mouth daily with breakfast. 90 capsule 0   Current Facility-Administered Medications  Medication Dose Route Frequency Provider Last Rate Last Admin   cyanocobalamin ((VITAMIN B-12)) injection  1,000 mcg  1,000 mcg Intramuscular Q30 days McLean-Scocuzza, Pasty Spillers, MD   1,000 mcg at 11/26/21 1610     Musculoskeletal: Strength & Muscle Tone:  UTA Gait & Station:  Seated Patient leans: N/A  Psychiatric Specialty Exam: Review of Systems  Endocrine:       Hot flashes  Musculoskeletal:  Positive for back pain.  Psychiatric/Behavioral:  Positive for sleep disturbance. The patient is nervous/anxious.   All other systems reviewed and are negative.   Last menstrual period 02/12/2016.There is no height or weight on file to calculate BMI.  General Appearance: Casual  Eye Contact:  Fair  Speech:  Clear and Coherent  Volume:  Normal  Mood:  Anxious  Affect:  Congruent  Thought Process:  Goal Directed and Descriptions of Associations: Intact  Orientation:  Full (Time, Place, and Person)  Thought Content: Logical   Suicidal Thoughts:  No  Homicidal Thoughts:  No  Memory:  Immediate;   Fair Recent;   Fair Remote;   Fair  Judgement:  Fair  Insight:  Fair  Psychomotor Activity:  Normal  Concentration:  Concentration: Fair  and Attention Span: Fair  Recall:  FiservFair  Fund of Knowledge: Fair  Language: Fair  Akathisia:  No  Handed:  Right  AIMS (if indicated): not done  Assets:  Communication Skills Desire for Improvement Housing Social Support  ADL's:  Intact  Cognition: WNL  Sleep:  Poor due to hotflashes   Screenings: AIMS    Flowsheet Row [facility]  AIMS Total Score 0      GAD-7    Flowsheet Row [facility] Counselor [facility] Video [facility] Office Visit from 07/26/2019 [facility]  Total GAD-7 Score 18 11 10 19 15       Mini-Mental    Flowsheet Row [facility]  Total Score (max 30 points ) 27      PHQ2-9    Flowsheet Row [facility] Office [facility] Video Visit from 10[facility]2021 [facility] Video [facility] Counselor [facility]  PHQ-2 Total Score 2 2 4 3 6   PHQ-9 Total Score 7 14 10 10 17       Flowsheet Row Video [facility] Office Visit from [facility]20[facility] [place]   C-SSRS RISK CATEGORY No Risk No Risk No Risk        Assessment and Plan: Cheyenne Gray is a 57 year old Caucasian female, widowed, has a history of PTSD, depression, multiple medical problems including status post tibial plateau fracture, status post ORIF, currently struggling with sleep, will benefit from following plan.  Plan PTSD-stable Continue CBT.  GAD-improving Mirtazapine 15 mg p.o. nightly Venlafaxine extended release 75 mg p.o. daily in the morning Patient encouraged to continue CBT.  MDD-in partial remission Venlafaxine extended release 75 mg p.o. daily in the morning Continue CBT  Insomnia-unstable Likely due to hot flashes.  Patient advised to follow up with provider, she is interested in initiating new FDA approved medication for home flashes. Patient to work on sleep hygiene techniques. Mirtazapine 15 mg p.o. nightly  Caffeine use disorder-improving Patient to continue to cut back.  Follow-up in clinic in 2 months or sooner if  needed.   Collaboration of Care: Collaboration of Care: Referral or follow-up with counselor/therapist AEB encouraged to continue psychotherapy sessions.  Patient/Guardian was advised Release of Information must be obtained prior to any record release in order to collaborate their care with an outside provider. Patient/Guardian was advised if they have not already done so to contact the registration department to sign all necessary forms in order for us to release information regarding their care.   Consent: Patient/Guardian gives verbal consent for treatment and assignment of benefits for services provided during this visit. Patient/Guardian expressed understanding and agreed to proceed.   This note was generated in part or whole with voice recognition software. Voice recognition is usually quite accurate but there are transcription errors that can and very often do  occur. I apologize for any typographical errors that were not detected and corrected.    Jomarie Longs, MD 06/20/2022, 1:02 PM

## 2022-06-24 ENCOUNTER — Other Ambulatory Visit: Payer: Self-pay | Admitting: Cardiovascular Disease

## 2022-06-24 DIAGNOSIS — I2510 Atherosclerotic heart disease of native coronary artery without angina pectoris: Secondary | ICD-10-CM

## 2022-06-24 DIAGNOSIS — R55 Syncope and collapse: Secondary | ICD-10-CM

## 2022-06-26 ENCOUNTER — Encounter: Payer: Self-pay | Admitting: Oncology

## 2022-06-26 ENCOUNTER — Inpatient Hospital Stay: Payer: Medicare Other | Attending: Oncology | Admitting: Oncology

## 2022-06-26 ENCOUNTER — Inpatient Hospital Stay: Payer: Medicare Other

## 2022-06-26 VITALS — BP 140/69 | HR 51 | Temp 96.0°F | Resp 18 | Wt 274.4 lb

## 2022-06-26 DIAGNOSIS — D508 Other iron deficiency anemias: Secondary | ICD-10-CM

## 2022-06-26 DIAGNOSIS — D509 Iron deficiency anemia, unspecified: Secondary | ICD-10-CM | POA: Insufficient documentation

## 2022-06-26 DIAGNOSIS — Z9884 Bariatric surgery status: Secondary | ICD-10-CM | POA: Insufficient documentation

## 2022-06-26 DIAGNOSIS — R918 Other nonspecific abnormal finding of lung field: Secondary | ICD-10-CM | POA: Diagnosis not present

## 2022-06-26 DIAGNOSIS — I110 Hypertensive heart disease with heart failure: Secondary | ICD-10-CM | POA: Insufficient documentation

## 2022-06-26 DIAGNOSIS — I5032 Chronic diastolic (congestive) heart failure: Secondary | ICD-10-CM | POA: Diagnosis not present

## 2022-06-26 DIAGNOSIS — Z809 Family history of malignant neoplasm, unspecified: Secondary | ICD-10-CM | POA: Diagnosis not present

## 2022-06-26 DIAGNOSIS — Z79899 Other long term (current) drug therapy: Secondary | ICD-10-CM | POA: Diagnosis not present

## 2022-06-26 DIAGNOSIS — I422 Other hypertrophic cardiomyopathy: Secondary | ICD-10-CM | POA: Diagnosis not present

## 2022-06-26 LAB — CBC WITH DIFFERENTIAL/PLATELET
Abs Immature Granulocytes: 0.02 10*3/uL (ref 0.00–0.07)
Basophils Absolute: 0.1 10*3/uL (ref 0.0–0.1)
Basophils Relative: 1 %
Eosinophils Absolute: 0.2 10*3/uL (ref 0.0–0.5)
Eosinophils Relative: 2 %
HCT: 39.1 % (ref 36.0–46.0)
Hemoglobin: 12.9 g/dL (ref 12.0–15.0)
Immature Granulocytes: 0 %
Lymphocytes Relative: 31 %
Lymphs Abs: 2.3 10*3/uL (ref 0.7–4.0)
MCH: 30.6 pg (ref 26.0–34.0)
MCHC: 33 g/dL (ref 30.0–36.0)
MCV: 92.9 fL (ref 80.0–100.0)
Monocytes Absolute: 0.5 10*3/uL (ref 0.1–1.0)
Monocytes Relative: 6 %
Neutro Abs: 4.4 10*3/uL (ref 1.7–7.7)
Neutrophils Relative %: 60 %
Platelets: 249 10*3/uL (ref 150–400)
RBC: 4.21 MIL/uL (ref 3.87–5.11)
RDW: 12.7 % (ref 11.5–15.5)
WBC: 7.4 10*3/uL (ref 4.0–10.5)
nRBC: 0 % (ref 0.0–0.2)

## 2022-06-26 LAB — COMPREHENSIVE METABOLIC PANEL
ALT: 13 U/L (ref 0–44)
AST: 27 U/L (ref 15–41)
Albumin: 3.5 g/dL (ref 3.5–5.0)
Alkaline Phosphatase: 139 U/L — ABNORMAL HIGH (ref 38–126)
Anion gap: 8 (ref 5–15)
BUN: 8 mg/dL (ref 6–20)
CO2: 27 mmol/L (ref 22–32)
Calcium: 8.7 mg/dL — ABNORMAL LOW (ref 8.9–10.3)
Chloride: 103 mmol/L (ref 98–111)
Creatinine, Ser: 0.94 mg/dL (ref 0.44–1.00)
GFR, Estimated: >60 mL/min (ref >60)
Glucose, Bld: 90 mg/dL (ref 70–99)
Potassium: 4.3 mmol/L (ref 3.5–5.1)
Sodium: 138 mmol/L (ref 135–145)
Total Bilirubin: 0.6 mg/dL (ref 0.3–1.2)
Total Protein: 7.6 g/dL (ref 6.5–8.1)

## 2022-06-26 LAB — VITAMIN B12: Vitamin B-12: 231 pg/mL (ref 180–914)

## 2022-06-26 LAB — IRON AND TIBC
Iron: 59 ug/dL (ref 28–170)
Saturation Ratios: 16 % (ref 10.4–31.8)
TIBC: 374 ug/dL (ref 250–450)
UIBC: 315 ug/dL

## 2022-06-26 LAB — FOLATE: Folate: 8 ng/mL (ref >5.9)

## 2022-06-26 LAB — FERRITIN: Ferritin: 30 ng/mL (ref 11–307)

## 2022-06-26 NOTE — Assessment & Plan Note (Signed)
#  History of iron deficiency anemia.  In the context of gastric bypass. Check CBC, iron, TIBC ferritin Today's labs showed ferritin of 30, iron saturation 16, patient has normal hemoglobin. Given her history of gastric bypass and borderline iron saturation, I recommend 1 dose of Venofer for maintenance.

## 2022-06-26 NOTE — Assessment & Plan Note (Signed)
Check vitamin B12 and folate level.

## 2022-06-26 NOTE — Assessment & Plan Note (Addendum)
These lung nodules are less then 4 mm on previous CT  She is made never smoker.  Patient has no risk factors for lung cancer. Currently she does not have any physical symptoms. Reassurance was provided.

## 2022-06-26 NOTE — Progress Notes (Signed)
Hematology/Oncology  Follow up note [facility] [phone] [phone]     REASON FOR VISIT Follow-up for iron deficiency anemia, history of gastric bypass  ASSESSMENT & PLAN:   Family history of cancer Refer to genetic counselor.   IDA (iron deficiency anemia) #History of iron deficiency anemia.  In the context of gastric bypass. Check CBC, iron, TIBC ferritin Today's labs showed ferritin of 30, iron saturation 16, patient has normal hemoglobin. Given her history of gastric bypass and borderline iron saturation, I recommend 1 dose of Venofer for maintenance.  H/O gastric bypass Check vitamin B12 and folate level.  Lung nodules These lung nodules are less then 4 mm on previous CT  She is made never smoker.  Patient has no risk factors for lung cancer. Currently she does not have any physical symptoms. Reassurance was provided.    Orders Placed This Encounter  Procedures   Vitamin B12    Standing Status:   Future    Number of Occurrences:   1    Standing Expiration Date:   06/26/2023   Folate    Standing Status:   Future    Number of Occurrences:   1    Standing Expiration Date:   06/26/2023   Iron and TIBC    Standing Status:   Future    Number of Occurrences:   1    Standing Expiration Date:   06/26/2023   Ferritin    Standing Status:   Future    Number of Occurrences:   1    Standing Expiration Date:   12/26/2022   CBC with Differential/Platelet    Standing Status:   Future    Number of Occurrences:   1    Standing Expiration Date:   06/26/2023   Comprehensive metabolic panel    Standing Status:   Future    Number of Occurrences:   1    Standing Expiration Date:   06/26/2023   CBC with Differential (Cancer Center Only)    Standing Status:   Future    Standing Expiration Date:   06/26/2023   Iron and TIBC    Standing Status:   Future    Standing Expiration Date:   06/26/2023   Vitamin B12    Standing Status:    Future    Standing Expiration Date:   06/26/2023   Ambulatory referral to Genetics    Referral Priority:   Routine    Referral Type:   Consultation    Referral Reason:   Specialty Services Required    Number of Visits Requested:   1   Follow-up in 6 months. All questions were answered. The patient knows to call the clinic with any problems, questions or concerns.  Rickard Patience, MD, PhD [facility] Hematology Oncology 06/26/2022    HISTORY OF PRESENTING ILLNESS:  This is a patient who used to follow up with Dr. Sherrlyn Hock presents for follow-up of management of her anemia.   History of gastric bypass in 2001 Patient was last seen by Dr. Sherrlyn Hock in July 2016 for iron deficiency.  She was again being seen by nurse practitioner on August 21, 2015 for follow-up of her iron deficiency anemia.  She has a history of iron deficiency anemia remote gastric bypass history in 2001.  She used to IV iron infusion with venofer 100mg  Every 3 months but she has a follow-up with [place] for about a year.  Patient reports feeling fatigued.  She did not have much  of appetite.  Denies any nausea vomiting diarrhea or abdominal pain.  She has no unintentional weight loss, instead she feels that she has gained a few pounds.  # . She follows up with cardiology for CAD, long QT syndrome, chest pain, lower extremity edema.  INTERVAL HISTORY Cheyenne Gray is a 57 y.o. female who has above history reviewed by me today presents for follow up visit for management of lung nodule, history of iron deficiency anemia due to gastric bypass.  .  Patient was last seen today by me.  Virtual visit in February 2021. Recently she [facility] of chest pain.  Patient had CT PE as well as abdomen scan done in the emergency room. 01/31/2020, CT chest PE protocol showed no PE.  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  Tiny punctated bilateral pulmonary nodules-3 mm nodule near the left  lung base, punctate nodule in the right lower lobe.  Mild bronchial wall thickening.  Patulous fluid-filled esophagus. CT abdomen pelvis with contrast showed no etiology of abdominal pain was identified.  Mild intra and extra hepatic bile duct dilatation which can be seen status post cholecystectomy.   INTERVAL HISTORY Ovida Viann FishM Machorro is a 57 y.o. female who has above history reviewed by me today presents to reestablish care Patient was last seen in 2021, and lost follow-up after that visit. Patient reports feeling more tired and fatigued recently.  Previously she tolerated iron infusion very well. Patient follows up with cardiology for Hypertrophic cardiomyopathy, MYBPC 3 gene. Patient reports extensive family history of cancer.  She is concerned about lung nodules that was detected on her CT scan on 01/31/2020-3 mm nodule left lung base, punctate nodule right lower lobe.  She denies history of smoking.  Review of Systems  Constitutional:  Positive for malaise/fatigue. Negative for chills, fever and weight loss.  HENT:  Negative for hearing loss and nosebleeds.   Eyes:  Negative for photophobia and pain.  Respiratory:  Negative for cough and sputum production.   Cardiovascular:  Negative for chest pain and palpitations.  Gastrointestinal:  Negative for abdominal pain, heartburn, nausea and vomiting.  Genitourinary:  Negative for dysuria.  Musculoskeletal:  Negative for myalgias.  Skin:  Negative for rash.  Neurological:  Negative for dizziness.  Endo/Heme/Allergies:  Does not bruise/bleed easily.  Psychiatric/Behavioral:  Negative for depression.     MEDICAL HISTORY:  Past Medical History:  Diagnosis Date   (HFpEF) heart failure with preserved ejection fraction    a. Echo 2014: EF 65-70%, nl WM, mildly dilated LA, PASP nl; b. 12/2014 Echo: EF 65-70%, no rwma, mod septal hypertrophy w/o LVOT gradient or SAM; c. 07/2017 Echo: EF 55-60%, no rwma, mildly dil RV w/ nl syst fxn. Mildly dil  RA. Dilated IVC w/ elevated CVP. Triv post effusion.   Anxiety    Asthma    Chronic pain    on methadone, managed by Dr. Metta Clinesrisp   Concussion    hx of 4   Coronary artery disease, non-occlusive    a. LHC 1/18: proximal to mid LAD 40% stenosed, mid LAD 30% stenosed, mid RCA 20% stenosed, distal RCA 20% stenosed, EF 55-65%, LVEDP normal   Depression    DJD (degenerative joint disease), multiple sites    History of shingles    HOCM (hypertrophic obstructive cardiomyopathy)    Hypertension    Iron deficiency anemia    Long QT interval    Obesity  Palpitations    a. 24 hour Holter: NSR, sinus brady down to 48, occasional PVCs & couplets, 8 beats NSVT; b. 30 day event monitor 2015: NSR with rare PVC.   Psoriasis    Syncope and collapse    Vitamin D deficiency    Wears dentures    full upper and lower    SURGICAL HISTORY: Past Surgical History:  Procedure Laterality Date   ABDOMINOPLASTY     tummy tuck ? year    BARIATRIC SURGERY  2001   CARDIAC CATHETERIZATION Left 04/16/2016   Procedure: Left Heart Cath and Coronary Angiography;  Surgeon: Antonieta Iba, MD;  Location: [facility] INVASIVE CV LAB;  Service: Cardiovascular;  Laterality: Left;   CHOLECYSTECTOMY  2001   COLONOSCOPY WITH PROPOFOL N/A 09/28/2018   Procedure: COLONOSCOPY WITH PROPOFOL;  Surgeon: Pasty Spillers, MD;  Location: [facility];  Service: Endoscopy;  Laterality: N/A;   ESOPHAGOGASTRODUODENOSCOPY (EGD) WITH PROPOFOL N/A 09/28/2018   Procedure: ESOPHAGOGASTRODUODENOSCOPY (EGD) WITH BIOPSY;  Surgeon: Pasty Spillers, MD;  Location: [facility];  Service: Endoscopy;  Laterality: N/A;   EXTERNAL FIXATION LEG Left 10/29/2020   Procedure: EXTERNAL FIXATION LEFT KNEE;  Surgeon: Myrene Galas, MD;  Location: [facility] OR;  Service: Orthopedics;  Laterality: Left;   GALLBLADDER SURGERY     GASTRIC BYPASS  2001   GASTROPLASTY      SOCIAL HISTORY: Social History   Socioeconomic History   Marital  status: Widowed    Spouse name: Not on file   Number of children: 1   Years of education: assoc degree   Highest education level: Associate degree: occupational, Scientist, product/process development, or vocational program  Occupational History   Not on file  Tobacco Use   Smoking status: Never   Smokeless tobacco: Never  Vaping Use   Vaping Use: Never used  Substance and Sexual Activity   Alcohol use: Not Currently    Comment: holidays   Drug use: No   Sexual activity: Not Currently  Other Topics Concern   Not on file  Social History Narrative   Adopted daughter Morrell Riddle [phone] (now lives in [place] going to [place] state has apt there)   Significant other mac [phone], former husband died    Teacher ages 4 and up    Never smoker    No guns   Wears seat belt    No caffeine   Social Determinants of Health   Financial Resource Strain: High Risk (11/06/2017)   Overall Financial Resource Strain (CARDIA)    Difficulty of Paying Living Expenses: Very hard  Food Insecurity: No Food Insecurity (11/06/2017)   Hunger Vital Sign    Worried About Running Out of Food in the Last Year: Never true    Ran Out of Food in the Last Year: Never true  Transportation Needs: No Transportation Needs (11/06/2017)   PRAPARE - Administrator, Civil Service (Medical): No    Lack of Transportation (Non-Medical): No  Physical Activity: Inactive (11/06/2017)   Exercise Vital Sign    Days of Exercise per Week: 0 days    Minutes of Exercise per Session: 0 min  Stress: Stress Concern Present (11/06/2017)   [employer] of Occupational Health - Occupational Stress Questionnaire    Feeling of Stress : Very much  Social Connections: Socially Isolated (11/06/2017)   Social Connection and Isolation Panel [NHANES]    Frequency of Communication with Friends and Family: Once a week    Frequency of Social Gatherings  with Friends and Family: Never    Attends Religious Services: Never    Database administrator or  Organizations: No    Attends Banker Meetings: Never    Marital Status: Widowed  Intimate Partner Violence: Not At Risk (11/06/2017)   Humiliation, Afraid, Rape, and Kick questionnaire    Fear of Current or Ex-Partner: No    Emotionally Abused: No    Physically Abused: No    Sexually Abused: No    FAMILY HISTORY: Family History  Problem Relation Age of Onset   Heart attack Mother    Cancer Mother        pancreatitic 11   Early death Mother    Heart attack Father 86       MI   Early death Father    Heart disease Father    Heart attack Brother    Heart disease Brother    Arthritis Brother    Depression Brother    Diabetes Brother    Heart attack Maternal Grandmother    Cancer Maternal Grandmother        pancreatitic    Heart disease Maternal Grandmother    Lung cancer Maternal Grandmother    Pancreatic cancer Maternal Grandmother    Cancer Maternal Uncle        pancreatitic    Cancer Paternal Grandmother        ? type    Diabetes Paternal Grandmother    Cancer Maternal Uncle        pancreatitic    Breast cancer Maternal Aunt    Cancer Maternal Aunt        GYN   Cancer Maternal Aunt        GYN    ALLERGIES:  is allergic to covid-19 (mrna) vaccine (pfizer) [covid-19 (mrna) vaccine], penicillins, and bee venom.  MEDICATIONS:  Current Outpatient Medications  Medication Sig Dispense Refill   albuterol (PROAIR HFA) 108 (90 Base) MCG/ACT inhaler Inhale 1-2 puffs into the lungs every 6 (six) hours as needed for wheezing or shortness of breath. 54 g 3   Biotin 10 MG CAPS Take 10 mg by mouth daily.     carvedilol (COREG) 6.25 MG tablet Take 1 tablet (6.25 mg total) by mouth 2 (two) times daily with a meal. Additional refill available at office visit 180 tablet 2   diclofenac Sodium (VOLTAREN) 1 % GEL Apply topically.     EPINEPHrine 0.3 mg/0.3 mL IJ SOAJ injection Inject 0.3 mg into the muscle as needed for anaphylaxis. 1 each 2   furosemide (LASIX) 20 MG  tablet Take 1 tablet (20 mg total) by mouth daily as needed. 90 tablet 0   gabapentin (NEURONTIN) 400 MG capsule LIMIT 2 TABLETS IN THE A.M. AND MIDDAY AND 3 TABLETS EACH EVENING 630 capsule 3   isosorbide mononitrate (IMDUR) 60 MG 24 hr tablet TAKE ONE TABLET (60 mg total) BY MOUTH DAILY AT 9 AM 90 tablet 2   Magnesium Oxide 400 MG CAPS Take 1 capsule (400 mg total) by mouth daily. 30 capsule 6   methadone (DOLOPHINE) 10 MG tablet Limit 1 tablets by mouth 3 times per day if tolerated per methadone clinic 180 tablet 0   mirtazapine (REMERON) 15 MG tablet TAKE 1 TABLET BY MOUTH EVERYDAY AT BEDTIME 90 tablet 0   nitroGLYCERIN (NITROSTAT) 0.4 MG SL tablet PLACE 1 TABLET UNDER THE TONGUE EVERY 5 MINUTES AS NEEDED FOR CHEST PAIN. AFTER 2ND DOSE CALL 911 25 tablet 2   rosuvastatin (CRESTOR)  10 MG tablet TAKE ONE TABLET (10 mg total) BY MOUTH DAILY AT 5 PM 90 tablet 2   tiZANidine (ZANAFLEX) 4 MG tablet Take 4 mg by mouth 2 (two) times daily.     venlafaxine XR (EFFEXOR XR) 75 MG 24 hr capsule Take 1 capsule (75 mg total) by mouth daily with breakfast. 90 capsule 0   Semaglutide-Weight Management (WEGOVY) 2.4 MG/0.75ML SOAJ Inject 2.4 mg into the skin once a week. X 37month (Patient not taking: Reported on 03/21/2022) 3 mL 5   Current Facility-Administered Medications  Medication Dose Route Frequency Provider Last Rate Last Admin   cyanocobalamin ((VITAMIN B-12)) injection 1,000 mcg  1,000 mcg Intramuscular Q30 days McLean-Scocuzza, Pasty Spillers, MD   1,000 mcg at 11/26/21 0816     PHYSICAL EXAMINATION: ECOG PERFORMANCE STATUS: 1 - Symptomatic but completely ambulatory Vitals:   06/26/22 1336  BP: (!) 140/69  Pulse: (!) 51  Resp: 18  Temp: (!) 96 F (35.6 C)  SpO2: 94%   Filed Weights   06/26/22 1336  Weight: 274 lb 6.4 oz (124.5 kg)    Physical Exam Constitutional:      General: She is not in acute distress.    Appearance: She is not diaphoretic.     Comments: Obese  HENT:     Head:  Normocephalic and atraumatic.     Nose: Nose normal.     Mouth/Throat:     Pharynx: No oropharyngeal exudate.  Eyes:     General: No scleral icterus.       Left eye: No discharge.     Conjunctiva/sclera: Conjunctivae normal.     Pupils: Pupils are equal, round, and reactive to light.  Neck:     Vascular: No JVD.  Cardiovascular:     Rate and Rhythm: Normal rate and regular rhythm.     Heart sounds: Normal heart sounds. No murmur heard. Pulmonary:     Effort: Pulmonary effort is normal. No respiratory distress.     Breath sounds: Normal breath sounds. No wheezing or rales.  Chest:     Chest wall: No tenderness.  Abdominal:     General: Bowel sounds are normal. There is no distension.     Palpations: Abdomen is soft. There is no mass.     Tenderness: There is no abdominal tenderness. There is no rebound.  Musculoskeletal:        General: No tenderness or deformity. Normal range of motion.     Cervical back: Normal range of motion and neck supple.  Lymphadenopathy:     Cervical: No cervical adenopathy.  Skin:    General: Skin is warm and dry.     Findings: No erythema or rash.  Neurological:     Mental Status: She is alert and oriented to person, place, and time.     Cranial Nerves: No cranial nerve deficit.     Motor: No abnormal muscle tone.     Coordination: Coordination normal.  Psychiatric:        Mood and Affect: Affect normal.        Judgment: Judgment normal.      LABORATORY DATA:  I have reviewed the data as listed Lab Results  Component Value Date   WBC 7.4 06/26/2022   HGB 12.9 06/26/2022   HCT 39.1 06/26/2022   MCV 92.9 06/26/2022   PLT 249 06/26/2022      Latest Ref Rng & Units 06/26/2022    2:16 PM 12/16/2021    9:34 AM 11/26/2021  8:43 AM  CMP  Glucose 70 - 99 mg/dL 90   77   BUN 6 - 20 mg/dL 8   9   Creatinine 7.82 - 1.00 mg/dL 9.56   2.13   Sodium 086 - 145 mmol/L 138   141   Potassium 3.5 - 5.1 mmol/L 4.3   4.0   Chloride 98 - 111 mmol/L  103   102   CO2 22 - 32 mmol/L 27   31   Calcium 8.9 - 10.3 mg/dL 8.7   9.1   Total Protein 6.5 - 8.1 g/dL 7.6   6.9   Total Bilirubin 0.3 - 1.2 mg/dL 0.6   0.5   Alkaline Phos 38 - 126 U/L 139  144  155   AST 15 - 41 U/L 27   27   ALT 0 - 44 U/L 13   17      Lab Results  Component Value Date   IRON 59 06/26/2022   TIBC 374 06/26/2022   IRONPCTSAT 16 06/26/2022   FERRITIN 30 06/26/2022

## 2022-06-26 NOTE — Assessment & Plan Note (Signed)
Refer to genetic counselor.  

## 2022-06-27 ENCOUNTER — Other Ambulatory Visit: Payer: Self-pay | Admitting: Oncology

## 2022-06-27 ENCOUNTER — Telehealth: Payer: Self-pay

## 2022-06-27 MED ORDER — VITAMIN B-12 1000 MCG SL SUBL: 1000.0000 ug | SUBLINGUAL_TABLET | Freq: Every day | SUBLINGUAL | 3 refills | Status: DC

## 2022-06-27 NOTE — Telephone Encounter (Signed)
-----   Message from Rickard Patience, MD sent at 06/26/2022  7:20 PM EDT ----- Please arrange patient to get 1 dose of Venofer treatment.

## 2022-06-27 NOTE — Telephone Encounter (Signed)
Dr. Cathie Hoops has communicated plan with pt via Mychart. Please arrange and inform pt of appt:   1 dose of venofer

## 2022-06-30 ENCOUNTER — Encounter: Payer: Self-pay | Admitting: Family Medicine

## 2022-06-30 ENCOUNTER — Ambulatory Visit (INDEPENDENT_AMBULATORY_CARE_PROVIDER_SITE_OTHER): Payer: Medicare Other | Admitting: Family Medicine

## 2022-06-30 VITALS — BP 130/70 | HR 58 | Temp 98.4°F | Ht 65.0 in | Wt 272.8 lb

## 2022-06-30 DIAGNOSIS — F1120 Opioid dependence, uncomplicated: Secondary | ICD-10-CM

## 2022-06-30 DIAGNOSIS — Z114 Encounter for screening for human immunodeficiency virus [HIV]: Secondary | ICD-10-CM

## 2022-06-30 DIAGNOSIS — Z6841 Body Mass Index (BMI) 40.0 and over, adult: Secondary | ICD-10-CM

## 2022-06-30 DIAGNOSIS — Z1231 Encounter for screening mammogram for malignant neoplasm of breast: Secondary | ICD-10-CM

## 2022-06-30 DIAGNOSIS — E782 Mixed hyperlipidemia: Secondary | ICD-10-CM | POA: Diagnosis not present

## 2022-06-30 DIAGNOSIS — I421 Obstructive hypertrophic cardiomyopathy: Secondary | ICD-10-CM | POA: Diagnosis not present

## 2022-06-30 DIAGNOSIS — E538 Deficiency of other specified B group vitamins: Secondary | ICD-10-CM

## 2022-06-30 DIAGNOSIS — I10 Essential (primary) hypertension: Secondary | ICD-10-CM | POA: Diagnosis not present

## 2022-06-30 DIAGNOSIS — I2510 Atherosclerotic heart disease of native coronary artery without angina pectoris: Secondary | ICD-10-CM

## 2022-06-30 DIAGNOSIS — F39 Unspecified mood [affective] disorder: Secondary | ICD-10-CM

## 2022-06-30 DIAGNOSIS — I739 Peripheral vascular disease, unspecified: Secondary | ICD-10-CM | POA: Diagnosis not present

## 2022-06-30 DIAGNOSIS — J4520 Mild intermittent asthma, uncomplicated: Secondary | ICD-10-CM

## 2022-06-30 DIAGNOSIS — G894 Chronic pain syndrome: Secondary | ICD-10-CM | POA: Diagnosis not present

## 2022-06-30 DIAGNOSIS — Z1211 Encounter for screening for malignant neoplasm of colon: Secondary | ICD-10-CM

## 2022-06-30 NOTE — Progress Notes (Signed)
presents today for injection per MD orders. B12 injection administered IM in left Upper Arm. Administration without incident. Patient tolerated well.  Adore Kithcart,cma   

## 2022-06-30 NOTE — Progress Notes (Signed)
SUBJECTIVE:   Chief Complaint  Patient presents with   Transitions Of Care   HPI Patient presents to clinic to transfer care.  No acute concerns today.  HOCM/hypertension/hyperlipidemia/leg edema Asymptomatic.  Currently on carvedilol 6.25 mg twice daily, Lasix 20 mg daily, Imdur 60 mg daily, Crestor 10 mg daily.  Tolerating medications well.  Denies any myalgias.  Has Nitrostat for chest pain.  Follows with cardiology, Dr. Mariah Milling.  Mood disorder Currently taking Remeron 15 mg nightly.  Was prescribed venlafaxine 75 mg daily but reports allergic reaction to medication.  Follows with Dr. Mariam Dollar, psychiatry.   Chronic pain syndrome Currently follows with Dr. Lennette Bihari and Dr. Metta Clines.  On methadone 10 mg 3 times daily, gabapentin 800 mg a.m., 800 mg midday., 1200 mg p.m.  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  Constitutional:      General: She is not in acute distress.    Appearance: She is obese. She is not ill-appearing.  HENT:     Head: Normocephalic.     Nose: Nose normal.  Eyes:     Conjunctiva/sclera: Conjunctivae normal.  Neck:     Thyroid: No thyromegaly or thyroid tenderness.  Cardiovascular:     Rate and Rhythm: Normal rate and regular rhythm.     Heart sounds: Normal heart sounds.  Pulmonary:     Effort: Pulmonary effort is normal.     Breath sounds: Normal breath sounds.  Abdominal:     General: Abdomen is flat. Bowel sounds are normal.     Palpations: Abdomen is soft.  Musculoskeletal:        General: Normal range of motion.     Cervical back: Normal range of motion.  Neurological:     Mental Status: She is  alert and oriented to person, place, and time. Mental status is at baseline.  Psychiatric:        Mood and Affect: Mood normal.        Behavior: Behavior normal.        Thought Content: Thought content normal.        Judgment: Judgment normal.     ASSESSMENT/PLAN:  B12 deficiency Assessment & Plan: Chronic.  Vitamin B12 injection today administered by CMA   Mixed hyperlipidemia  Essential hypertension Assessment & Plan: Chronic.  Stable. Currently takes Coreg 6.25 mg twice daily Follows with cardiology.  Orders: -     Lipid panel; Future -     Hemoglobin A1c; Future -     TSH; Future -     3D Screening Mammogram, Left and Right; Future -     Ambulatory referral to Gastroenterology  Colon cancer screening -     Ambulatory referral to Gastroenterology  Breast cancer screening by mammogram -     3D Screening Mammogram, Left and Right; Future  Chronic pain syndrome -     Gabapentin; LIMIT 2 TABLETS IN THE A.M. AND MIDDAY AND 3 TABLETS EACH EVENING -     Methadone HCl; Limit 1 tablets by mouth 3 times per day if tolerated per methadone clinic  Opioid  dependence in controlled environment  PVD (peripheral vascular disease) Assessment & Plan: Chronic. Currently on Lasix 20 mg as needed Follows with cardiology.   HOCM (hypertrophic obstructive cardiomyopathy) Assessment & Plan: Chronic.  Asymptomatic. Takes Imdur 60 mg daily Has Nitrostat for chest pain. Blood pressure controlled Follows with cardiology.    Coronary artery disease involving native coronary artery of native heart without angina pectoris Assessment & Plan: Chronic.  On statin therapy and tolerating well. Takes Crestor 10 mg daily Follows with cardiology   Mild intermittent asthma, unspecified whether complicated Assessment & Plan: Night.  Stable.  Previous evaluation by pulmonology or PFTs.  Has albuterol inhaler and uses as needed.    Morbid obesity Assessment & Plan: Elevated BMI. Was  previously prescribed [facility] however had not started medication. Would like to try [facility] again. Will check labs today Patient to call insurance to discuss coverage given most insurances are not covering [facility] for weight loss.    Mood disorder Assessment & Plan: Chronic.  Follows with psychiatry.  Currently on Remeron 15 mg at night.  Reports had allergic reaction to Effexor. Denies SI/HI. Follow-up with Dr. Mariam Dollar as scheduled Continue CBT   Encounter for screening for HIV -     HIV Antibody (routine testing w rflx); Future  HCM Medicare annual wellness due HIV screening due Hepatitis C screening completed  Pap smear due.  No previous Pap on file.  Patient to schedule appointment for Pap Mammogram due.  Referral sent today. Tdap due.  Declined today Recent colonoscopy 09/28/2018 recommended repeat in 3 months secondary to to poor bowel prep.  21-month follow-up had not been completed.  Referral sent for repeat colonoscopy. Recommend shingles vaccine Recommend pneumonia 20 vaccine    PDMP reviewed  Return if symptoms worsen or fail to improve.  Dana Allan, MD

## 2022-06-30 NOTE — Patient Instructions (Addendum)
It was a pleasure meeting you today. Thank you for allowing me to take part in your health care.  Our goals for today as we discussed include:  Schedule PAP in near future Schedule fasting lab appointment  Recommend Shingles vaccine.  This is a 2 dose series and can be given at your local pharmacy.  Please talk to your pharmacist about this.   Recommend Tetanus Vaccination.  This is given every 10 years.   Recommend Pneumonia 20 vaccine  Referral sent for Mammogram. Please call to schedule appointment. [facility] [address] [place], [place] [address] [phone]     Schedule Medicare Annual Wellness Visit     If you have any questions or concerns, please do not hesitate to call the office at [phone].  I look forward to our next visit and until then take care and stay safe.  Regards,   Dana Allan, MD   [facility]

## 2022-07-01 ENCOUNTER — Telehealth: Payer: Self-pay

## 2022-07-01 ENCOUNTER — Other Ambulatory Visit: Payer: Self-pay

## 2022-07-01 ENCOUNTER — Encounter: Payer: Self-pay | Admitting: Oncology

## 2022-07-01 DIAGNOSIS — Z1211 Encounter for screening for malignant neoplasm of colon: Secondary | ICD-10-CM

## 2022-07-01 MED ORDER — SUTAB 1479-225-188 MG PO TABS: 12.0000 | ORAL_TABLET | Freq: Two times a day (BID) | ORAL | 0 refills | Status: AC

## 2022-07-01 NOTE — Telephone Encounter (Signed)
Gastroenterology Pre-Procedure Review  Request Date: 07/21/22 Requesting Physician: Dr. Allegra Lai  PATIENT REVIEW QUESTIONS: The patient responded to the following health history questions as indicated:    1. Are you having any GI issues? no history of gastric bypass 2. Do you have a personal history of Polyps? no previous colonoscopy 09/28/22 attempted but patient was unable to get all prep down due to her gastric bypass.  She has requested to have Sutab rx. 3. Do you have a family history of Colon Cancer or Polyps? no 4. Diabetes Mellitus? no 5. Joint replacements in the past 12 months?no 6. Major health problems in the past 3 months?no 7. Any artificial heart valves, MVP, or defibrillator?no patient has cardiac history cardiac clearance sent to Dr. Lalla Brothers.    MEDICATIONS & ALLERGIES:    Patient reports the following regarding taking any anticoagulation/antiplatelet therapy:   Plavix, Coumadin, Eliquis, Xarelto, Lovenox, Pradaxa, Brilinta, or Effient? no Aspirin? no  Patient confirms/reports the following medications:  Current Outpatient Medications  Medication Sig Dispense Refill   albuterol (PROAIR HFA) 108 (90 Base) MCG/ACT inhaler Inhale 1-2 puffs into the lungs every 6 (six) hours as needed for wheezing or shortness of breath. 54 g 3   Biotin 10 MG CAPS Take 10 mg by mouth daily.     carvedilol (COREG) 6.25 MG tablet Take 1 tablet (6.25 mg total) by mouth 2 (two) times daily with a meal. Additional refill available at office visit 180 tablet 2   Cholecalciferol 1.25 MG (50000 UT) capsule Take by mouth.     Cyanocobalamin (VITAMIN B-12) 1000 MCG SUBL Place 1 tablet (1,000 mcg total) under the tongue daily. 90 tablet 3   diclofenac Sodium (VOLTAREN) 1 % GEL Apply topically.     EPINEPHrine 0.3 mg/0.3 mL IJ SOAJ injection Inject 0.3 mg into the muscle as needed for anaphylaxis. 1 each 2   furosemide (LASIX) 20 MG tablet Take 1 tablet (20 mg total) by mouth daily as needed. 90 tablet 0    gabapentin (NEURONTIN) 400 MG capsule LIMIT 2 TABLETS IN THE A.M. AND MIDDAY AND 3 TABLETS EACH EVENING 630 capsule 3   isosorbide mononitrate (IMDUR) 60 MG 24 hr tablet TAKE ONE TABLET (60 mg total) BY MOUTH DAILY AT 9 AM 90 tablet 2   Magnesium Oxide 400 MG CAPS Take 1 capsule (400 mg total) by mouth daily. 30 capsule 6   methadone (DOLOPHINE) 10 MG tablet Limit 1 tablets by mouth 3 times per day if tolerated per methadone clinic 180 tablet 0   mirtazapine (REMERON) 15 MG tablet TAKE 1 TABLET BY MOUTH EVERYDAY AT BEDTIME 90 tablet 0   nitroGLYCERIN (NITROSTAT) 0.4 MG SL tablet PLACE 1 TABLET UNDER THE TONGUE EVERY 5 MINUTES AS NEEDED FOR CHEST PAIN. AFTER 2ND DOSE CALL 911 25 tablet 2   rosuvastatin (CRESTOR) 10 MG tablet TAKE ONE TABLET (10 mg total) BY MOUTH DAILY AT 5 PM 90 tablet 2   Semaglutide-Weight Management (WEGOVY) 2.4 MG/0.75ML SOAJ Inject 2.4 mg into the skin once a week. X 68month (Patient not taking: Reported on 07/01/2022) 3 mL 5   tiZANidine (ZANAFLEX) 4 MG tablet Take 4 mg by mouth 2 (two) times daily.     Current Facility-Administered Medications  Medication Dose Route Frequency Provider Last Rate Last Admin   cyanocobalamin ((VITAMIN B-12)) injection 1,000 mcg  1,000 mcg Intramuscular Q30 days McLean-Scocuzza, Pasty Spillers, MD   1,000 mcg at 06/30/22 1513    Patient confirms/reports the following allergies:  Allergies  Allergen Reactions   Covid-19 (Mrna) Vaccine Proofreader) [Covid-19 (Mrna) Vaccine] Itching, Palpitations and Other (See Comments)    Throat closing.    Penicillins Hives, Shortness Of Breath and Rash    Has patient had a PCN reaction causing immediate rash, facial/tongue/throat swelling, SOB or lightheadedness with hypotension: Yes Has patient had a PCN reaction causing severe rash involving mucus membranes or skin necrosis: No Has patient had a PCN reaction that required hospitalization No Has patient had a PCN reaction occurring within the last 10 years: No If  all of the above answers are "NO", then may proceed with Cephalosporin use.    Bee Venom    Venlafaxine Anxiety    No orders of the defined types were placed in this encounter.   AUTHORIZATION INFORMATION Primary Insurance: 1D#: Group #:  Secondary Insurance: 1D#: Group #:  SCHEDULE INFORMATION: Date: 07/21/22 Time: Location: [facility]

## 2022-07-01 NOTE — Telephone Encounter (Signed)
..     Pre-operative Risk Assessment    Patient Name: Cheyenne Gray  DOB: 01-08-1966 MRN: [id number]      Request for Surgical Clearance    Procedure:   COLONSCOPY  Date of Surgery:  Clearance 07/21/22                                 Surgeon:  [facility] Surgeon's Group or Practice Name:  [facility] Phone number:  [phone] Fax number:  [phone]   Type of Clearance Requested:   - Medical    Type of Anesthesia:  General    Additional requests/questions:    Jola Babinski   07/01/2022, 1:44 PM

## 2022-07-03 ENCOUNTER — Inpatient Hospital Stay: Payer: Medicare Other

## 2022-07-03 VITALS — BP 136/86 | HR 63 | Temp 98.2°F | Resp 18

## 2022-07-03 DIAGNOSIS — D509 Iron deficiency anemia, unspecified: Secondary | ICD-10-CM | POA: Diagnosis not present

## 2022-07-03 DIAGNOSIS — I5032 Chronic diastolic (congestive) heart failure: Secondary | ICD-10-CM | POA: Diagnosis not present

## 2022-07-03 DIAGNOSIS — I422 Other hypertrophic cardiomyopathy: Secondary | ICD-10-CM | POA: Diagnosis not present

## 2022-07-03 DIAGNOSIS — Z9884 Bariatric surgery status: Secondary | ICD-10-CM | POA: Diagnosis not present

## 2022-07-03 DIAGNOSIS — R918 Other nonspecific abnormal finding of lung field: Secondary | ICD-10-CM | POA: Diagnosis not present

## 2022-07-03 DIAGNOSIS — I110 Hypertensive heart disease with heart failure: Secondary | ICD-10-CM | POA: Diagnosis not present

## 2022-07-03 DIAGNOSIS — Z79899 Other long term (current) drug therapy: Secondary | ICD-10-CM | POA: Diagnosis not present

## 2022-07-03 DIAGNOSIS — D508 Other iron deficiency anemias: Secondary | ICD-10-CM

## 2022-07-03 MED ORDER — SODIUM CHLORIDE 0.9 % IV SOLN
200.0000 mg | Freq: Once | INTRAVENOUS | Status: AC
  Administered 2022-07-03: 200 mg via INTRAVENOUS
  Filled 2022-07-03: qty 200

## 2022-07-03 MED ORDER — SODIUM CHLORIDE 0.9 % IV SOLN
INTRAVENOUS | Status: DC | PRN
  Filled 2022-07-03: qty 250

## 2022-07-03 NOTE — Telephone Encounter (Signed)
   Primary Cardiologist: Julien Nordmann, MD  Chart reviewed as part of pre-operative protocol coverage. Given past medical history and time since last visit, based on ACC/AHA guidelines, Cheyenne Gray would be at acceptable risk for the planned procedure without further cardiovascular testing.   Patient was advised that if she develops new symptoms prior to surgery to contact our office to arrange a follow-up appointment. She verbalized understanding.  I will route this recommendation to the requesting party via Epic fax function and remove from pre-op pool.  Please call with questions.  Levi Aland, NP-C  07/03/2022, 8:06 AM [address] Office [phone] Fax [phone]

## 2022-07-04 ENCOUNTER — Other Ambulatory Visit: Payer: Self-pay | Admitting: Psychiatry

## 2022-07-04 DIAGNOSIS — G4701 Insomnia due to medical condition: Secondary | ICD-10-CM

## 2022-07-04 NOTE — Progress Notes (Deleted)
Cardiology Office Note Date:  07/04/2022  Patient ID:  Cheyenne Gray, Cheyenne Gray Jun 09, 1965, MRN [id number] PCP:  Dana Allan, MD  Cardiologist:  Julien Nordmann, MD Electrophysiologist: Lanier Prude, MD  ***refresh   Chief Complaint: ***  History of Present Illness: Cheyenne Gray is a 57 y.o. female with PMH notable for HOCM, palpitations, HTN, NSVT, vasovagal syncope ; seen today for Lanier Prude, MD for routine electrophysiology followup.  Last saw Dr. Lalla Brothers 05/2021, no syncope. Known MYBPC 3 gene mutation. Planning to repeat zio and echo. Both reassuring.   Has history of syncope with prodrome of a hot flash over her head and body and changes to her vision, prodrome lasts for a couple seconds and reliably precedes the syncope.    *** palpitations *** syncope *** annual echo and 2 week zio due    Since last being seen in our clinic the patient reports doing ***.  she denies chest pain, palpitations, dyspnea, PND, orthopnea, nausea, vomiting, dizziness, syncope, edema, weight gain, or early satiety.     Past Medical History:  Diagnosis Date   (HFpEF) heart failure with preserved ejection fraction    a. Echo 2014: EF 65-70%, nl WM, mildly dilated LA, PASP nl; b. 12/2014 Echo: EF 65-70%, no rwma, mod septal hypertrophy w/o LVOT gradient or SAM; c. 07/2017 Echo: EF 55-60%, no rwma, mildly dil RV w/ nl syst fxn. Mildly dil RA. Dilated IVC w/ elevated CVP. Triv post effusion.   Anxiety    Asthma    Chronic pain    on methadone, managed by Dr. Metta Clines   Concussion    hx of 4   Coronary artery disease, non-occlusive    a. LHC 1/18: proximal to mid LAD 40% stenosed, mid LAD 30% stenosed, mid RCA 20% stenosed, distal RCA 20% stenosed, EF 55-65%, LVEDP normal   Depression    DJD (degenerative joint disease), multiple sites    History of shingles    HOCM (hypertrophic obstructive cardiomyopathy)    Hypertension    Iron deficiency anemia    Long QT interval    Obesity     Palpitations    a. 24 hour Holter: NSR, sinus brady down to 48, occasional PVCs & couplets, 8 beats NSVT; b. 30 day event monitor 2015: NSR with rare PVC.   Psoriasis    Syncope and collapse    Vitamin D deficiency    Wears dentures    full upper and lower    Past Surgical History:  Procedure Laterality Date   ABDOMINOPLASTY     tummy tuck ? year    BARIATRIC SURGERY  2001   CARDIAC CATHETERIZATION Left 04/16/2016   Procedure: Left Heart Cath and Coronary Angiography;  Surgeon: Antonieta Iba, MD;  Location: [facility];  Service: Cardiovascular;  Laterality: Left;   CHOLECYSTECTOMY  2001   COLONOSCOPY WITH PROPOFOL N/A 09/28/2018   Procedure: COLONOSCOPY WITH PROPOFOL;  Surgeon: Pasty Spillers, MD;  Location: [facility];  Service: Endoscopy;  Laterality: N/A;   ESOPHAGOGASTRODUODENOSCOPY (EGD) WITH PROPOFOL N/A 09/28/2018   Procedure: ESOPHAGOGASTRODUODENOSCOPY (EGD) WITH BIOPSY;  Surgeon: Pasty Spillers, MD;  Location: [facility];  Service: Endoscopy;  Laterality: N/A;   EXTERNAL FIXATION LEG Left 10/29/2020   Procedure: EXTERNAL FIXATION LEFT KNEE;  Surgeon: Myrene Galas, MD;  Location: [facility] OR;  Service: Orthopedics;  Laterality: Left;   GALLBLADDER SURGERY     GASTRIC BYPASS  2001   GASTROPLASTY  Current Outpatient Medications  Medication Instructions   albuterol (PROAIR HFA) 108 (90 Base) MCG/ACT inhaler 1-2 puffs, Inhalation, Every 6 hours PRN   Biotin 10 mg, Oral, Daily   carvedilol (COREG) 6.25 mg, Oral, 2 times daily with meals, Additional refill available at office visit   Cholecalciferol 1.25 MG (50000 UT) capsule Oral   diclofenac Sodium (VOLTAREN) 1 % GEL Topical   EPINEPHrine (EPI-PEN) 0.3 mg, Intramuscular, As needed   furosemide (LASIX) 20 mg, Oral, Daily PRN   gabapentin (NEURONTIN) 400 MG capsule LIMIT 2 TABLETS IN THE A.M. AND MIDDAY AND 3 TABLETS EACH EVENING   isosorbide mononitrate (IMDUR) 60 MG 24 hr tablet TAKE  ONE TABLET (60 mg total) BY MOUTH DAILY AT 9 AM   Magnesium Oxide 400 mg, Oral, Daily   methadone (DOLOPHINE) 10 MG tablet Limit 1 tablets by mouth 3 times per day if tolerated per methadone clinic   mirtazapine (REMERON) 15 MG tablet TAKE 1 TABLET BY MOUTH EVERYDAY AT BEDTIME   nitroGLYCERIN (NITROSTAT) 0.4 MG SL tablet PLACE 1 TABLET UNDER THE TONGUE EVERY 5 MINUTES AS NEEDED FOR CHEST PAIN. AFTER 2ND DOSE CALL 911   rosuvastatin (CRESTOR) 10 MG tablet TAKE ONE TABLET (10 mg total) BY MOUTH DAILY AT 5 PM   tiZANidine (ZANAFLEX) 4 mg, Oral, 2 times daily   Vitamin B-12 1,000 mcg, Sublingual, Daily   Wegovy 2.4 mg, Subcutaneous, Weekly, X 39month    Social History:  The patient  reports that she has never smoked. She has never used smokeless tobacco. She reports that she does not currently use alcohol. She reports that she does not use drugs.   Family History:  *** include only if pertinent The patient's family history includes Arthritis in her brother; Breast cancer in her maternal aunt; Cancer in her maternal aunt, maternal aunt, maternal grandmother, maternal uncle, maternal uncle, mother, and paternal grandmother; Depression in her brother; Diabetes in her brother and paternal grandmother; Early death in her father and mother; Heart attack in her brother, maternal grandmother, and mother; Heart attack (age of onset: 64) in her father; Heart disease in her brother, father, and maternal grandmother; Lung cancer in her maternal grandmother; Pancreatic cancer in her maternal grandmother.***  ROS:  Please see the history of present illness. All other systems are reviewed and otherwise negative.   PHYSICAL EXAM: *** VS:  LMP 02/12/2016 (Approximate)  BMI: There is no height or weight on file to calculate BMI.  GEN- The patient is well appearing, alert and oriented x 3 today.   Lungs- Clear to ausculation bilaterally, normal work of breathing.  Heart- {Blank single:19197::"Regular","Irregularly  irregular"} rate and rhythm, no murmurs, rubs or gallops Extremities- {EDEMA LEVEL:28147::"No"} peripheral edema, warm, dry   EKG is not ordered. Personal review of EKG from {Blank single:19197::"today","***"} shows:  ***  Recent Labs: 11/26/2021: TSH 4.66 06/26/2022: ALT 13; BUN 8; Creatinine, Ser 0.94; Hemoglobin 12.9; Platelets 249; Potassium 4.3; Sodium 138  11/26/2021: Cholesterol 183; HDL 49.00; LDL Cholesterol 112; Total CHOL/HDL Ratio 4; Triglycerides 109.0; VLDL 21.8   Estimated Creatinine Clearance: 90.4 mL/min (by C-G formula based on SCr of 0.94 mg/dL).   Wt Readings from Last 3 Encounters:  06/30/22 272 lb 12.8 oz (123.7 kg)  06/26/22 274 lb 6.4 oz (124.5 kg)  05/16/22 273 lb 8 oz (124.1 kg)     Additional studies reviewed include: Previous EP, cardiology notes.   TTE, 11/07/2021  1. Left ventricular ejection fraction, by estimation, is 55 to 60%. The  left ventricle has normal function. The left ventricle has no regional wall motion abnormalities. There is moderate left ventricular hypertrophy. No significant LVOT gradient measured. Left ventricular diastolic parameters are consistent with Grade II diastolic dysfunction (pseudonormalization).   2. Right ventricular systolic function is normal. The right ventricular size is normal.   3. Left atrial size was moderately dilated.   4. The mitral valve is normal in structure. Mild mitral valve regurgitation. No evidence of mitral stenosis.   5. The aortic valve is normal in structure. Aortic valve regurgitation is not visualized. Aortic valve sclerosis/calcification is present, without any evidence of aortic stenosis.   6. The inferior vena cava is normal in size with greater than 50%  respiratory variability, suggesting right atrial pressure of 3 mmHg.   Long term monitor, 07/07/2021 HR 42 - 136 bpm, average 60 bpm. Rare supraventricular ectopy, <1%. Frequent ventricular ectopy, 5.8%. No sustained arrhythmias.  Cardiac  MRI, 06/12/2020 1. Severe asymmetric septal hypertrophy, with basal septum measuring upto 1.6 cm.  2. Non-ischemic pattern of late gadolinium enhancement involving the LV mid basal septal wall (areas of maximum hypertrophy) and right ventricular insertion points of the LV walls. Scar/LGE comprises 7.3% of total LV myocardium. 3. There is no evidence of left ventricular outflow tract obstruction or systolic anterior motion of mitral valve.  4. There is normal left and right ventricular size and function. LVEF 51% 5. Findings are consistent with Hypertrophic Cardiomyopathy (HCM), asymmetric septal variant.  ASSESSMENT AND PLAN:  #) HOCM #) NSVT   #) HTN    Current medicines are reviewed at length with the patient today.   The patient {ACTIONS; HAS/DOES NOT HAVE:19233} concerns regarding her medicines.  The following changes were made today:  {NONE DEFAULTED:18576}  Labs/ tests ordered today include: *** No orders of the defined types were placed in this encounter.    Disposition: Follow up with {EPMDS:28135} in {EPFOLLOW UP:28173}   Signed, Sherie Don, NP  07/04/22  1:50 PM  Electrophysiology [facility]

## 2022-07-06 ENCOUNTER — Encounter: Payer: Self-pay | Admitting: Family Medicine

## 2022-07-06 DIAGNOSIS — E538 Deficiency of other specified B group vitamins: Secondary | ICD-10-CM | POA: Insufficient documentation

## 2022-07-06 DIAGNOSIS — Z1231 Encounter for screening mammogram for malignant neoplasm of breast: Secondary | ICD-10-CM | POA: Insufficient documentation

## 2022-07-06 DIAGNOSIS — G894 Chronic pain syndrome: Secondary | ICD-10-CM | POA: Insufficient documentation

## 2022-07-06 DIAGNOSIS — Z1211 Encounter for screening for malignant neoplasm of colon: Secondary | ICD-10-CM | POA: Insufficient documentation

## 2022-07-06 DIAGNOSIS — Z114 Encounter for screening for human immunodeficiency virus [HIV]: Secondary | ICD-10-CM | POA: Insufficient documentation

## 2022-07-06 DIAGNOSIS — F39 Unspecified mood [affective] disorder: Secondary | ICD-10-CM | POA: Insufficient documentation

## 2022-07-06 DIAGNOSIS — F1120 Opioid dependence, uncomplicated: Secondary | ICD-10-CM | POA: Insufficient documentation

## 2022-07-06 MED ORDER — GABAPENTIN 400 MG PO CAPS
ORAL_CAPSULE | ORAL | Status: AC
Start: 2022-07-06 — End: ?

## 2022-07-06 MED ORDER — METHADONE HCL 10 MG PO TABS
ORAL_TABLET | ORAL | Status: AC
Start: 2022-07-06 — End: ?

## 2022-07-06 NOTE — Assessment & Plan Note (Addendum)
Chronic.  Asymptomatic. Takes Imdur 60 mg daily Has Nitrostat for chest pain. Blood pressure controlled Follows with cardiology.

## 2022-07-06 NOTE — Assessment & Plan Note (Signed)
Chronic.  Stable. Currently takes Coreg 6.25 mg twice daily Follows with cardiology.

## 2022-07-06 NOTE — Progress Notes (Deleted)
   SUBJECTIVE:   No chief complaint on file.  HPI Patient presents to clinic for annual visit and PAP  No acute concerns today.  HOCM/hypertension/hyperlipidemia/leg edema Asymptomatic.  Currently on carvedilol 6.25 mg twice daily, Lasix 20 mg daily, Imdur 60 mg daily, Crestor 10 mg daily.  Tolerating medications well.  Denies any myalgias.  Has Nitrostat for chest pain.  Follows with cardiology, Dr. Mariah Milling.  Mood disorder Currently taking Remeron 15 mg nightly.  Was prescribed venlafaxine 75 mg daily but reports allergic reaction to medication.  Follows with Dr. Mariam Dollar, psychiatry.   Chronic pain syndrome Currently follows with Dr. Lennette Bihari and Dr. Metta Clines.  On methadone 10 mg 3 times daily, gabapentin 800 mg a.m., 800 mg midday., 1200 mg p.m.  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   HCM Medicare annual wellness due HIV screening due Hepatitis C screening completed  Pap completed today Mammogram due.  Referral recently sent. Patient to schedule appointment Tdap due.  Declined today Recent colonoscopy 09/28/2018 recommended repeat in 3 months secondary to to poor bowel prep.  19-month follow-up had not been completed.  Referral sent for repeat colonoscopy. Recommend shingles vaccine Recommend pneumonia 20 vaccine    PDMP reviewed  No follow-ups on file.  Dana Allan, MD

## 2022-07-06 NOTE — Assessment & Plan Note (Signed)
Chronic.  Vitamin B12 injection today administered by CMA

## 2022-07-06 NOTE — Assessment & Plan Note (Signed)
Elevated BMI. Was previously prescribed [facility] however had not started medication. Would like to try [facility] again. Will check labs today Patient to call insurance to discuss coverage given most insurances are not covering [facility] for weight loss.

## 2022-07-06 NOTE — Assessment & Plan Note (Signed)
Chronic. Currently on Lasix 20 mg as needed Follows with cardiology.

## 2022-07-06 NOTE — Assessment & Plan Note (Signed)
Chronic.  Follows with psychiatry.  Currently on Remeron 15 mg at night.  Reports had allergic reaction to Effexor. Denies SI/HI. Follow-up with Dr. Mariam Dollar as scheduled Continue CBT

## 2022-07-06 NOTE — Assessment & Plan Note (Signed)
Night.  Stable.  Previous evaluation by pulmonology or PFTs.  Has albuterol inhaler and uses as needed.

## 2022-07-06 NOTE — Assessment & Plan Note (Signed)
Chronic.  On statin therapy and tolerating well. Takes Crestor 10 mg daily Follows with cardiology

## 2022-07-07 ENCOUNTER — Telehealth: Payer: Self-pay

## 2022-07-07 ENCOUNTER — Ambulatory Visit: Payer: Medicare Other | Admitting: Family Medicine

## 2022-07-07 ENCOUNTER — Ambulatory Visit: Payer: Medicare Other | Admitting: Cardiology

## 2022-07-07 DIAGNOSIS — I10 Essential (primary) hypertension: Secondary | ICD-10-CM

## 2022-07-07 DIAGNOSIS — M791 Myalgia, unspecified site: Secondary | ICD-10-CM | POA: Diagnosis not present

## 2022-07-07 DIAGNOSIS — Z124 Encounter for screening for malignant neoplasm of cervix: Secondary | ICD-10-CM

## 2022-07-07 DIAGNOSIS — M5440 Lumbago with sciatica, unspecified side: Secondary | ICD-10-CM | POA: Diagnosis not present

## 2022-07-07 DIAGNOSIS — G8921 Chronic pain due to trauma: Secondary | ICD-10-CM | POA: Diagnosis not present

## 2022-07-07 DIAGNOSIS — G894 Chronic pain syndrome: Secondary | ICD-10-CM | POA: Diagnosis not present

## 2022-07-07 DIAGNOSIS — M542 Cervicalgia: Secondary | ICD-10-CM | POA: Diagnosis not present

## 2022-07-07 DIAGNOSIS — I421 Obstructive hypertrophic cardiomyopathy: Secondary | ICD-10-CM

## 2022-07-07 DIAGNOSIS — S82142A Displaced bicondylar fracture of left tibia, initial encounter for closed fracture: Secondary | ICD-10-CM | POA: Diagnosis not present

## 2022-07-07 DIAGNOSIS — I4729 Other ventricular tachycardia: Secondary | ICD-10-CM

## 2022-07-07 DIAGNOSIS — Z79891 Long term (current) use of opiate analgesic: Secondary | ICD-10-CM | POA: Diagnosis not present

## 2022-07-07 NOTE — Progress Notes (Deleted)
Cardiology Office Note Date:  07/07/2022  Patient ID:  Lyanne, Kates 1965-08-21, MRN [id number] PCP:  Dana Allan, MD  Cardiologist:  Julien Nordmann, MD Electrophysiologist: Lanier Prude, MD  ***refresh   Chief Complaint: ***  History of Present Illness: Cheyenne Gray is a 57 y.o. female with PMH notable for ***; seen today for Lanier Prude, MD for routine electrophysiology followup.  Last saw Dr. Lalla Brothers 05/2021, no syncope. Known MYBPC 3 gene mutation. Planning to repeat zio and echo. Both reassuring.   Has history of syncope with prodrome of a hot flash over her head and body and changes to her vision, prodrome lasts for a couple seconds and reliably precedes the syncope.    *** palpitations *** syncope *** annual echo and 2 week zio due   Since last being seen in our clinic the patient reports doing ***.  she denies chest pain, palpitations, dyspnea, PND, orthopnea, nausea, vomiting, dizziness, syncope, edema, weight gain, or early satiety.     Past Medical History:  Diagnosis Date   (HFpEF) heart failure with preserved ejection fraction    a. Echo 2014: EF 65-70%, nl WM, mildly dilated LA, PASP nl; b. 12/2014 Echo: EF 65-70%, no rwma, mod septal hypertrophy w/o LVOT gradient or SAM; c. 07/2017 Echo: EF 55-60%, no rwma, mildly dil RV w/ nl syst fxn. Mildly dil RA. Dilated IVC w/ elevated CVP. Triv post effusion.   Anxiety    Asthma    Chronic pain    on methadone, managed by Dr. Metta Clines   Concussion    hx of 4   Coronary artery disease, non-occlusive    a. LHC 1/18: proximal to mid LAD 40% stenosed, mid LAD 30% stenosed, mid RCA 20% stenosed, distal RCA 20% stenosed, EF 55-65%, LVEDP normal   Depression    DJD (degenerative joint disease), multiple sites    History of shingles    HOCM (hypertrophic obstructive cardiomyopathy)    Hypertension    Iron deficiency anemia    Long QT interval    Obesity    Palpitations    a. 24 hour Holter: NSR, sinus  brady down to 48, occasional PVCs & couplets, 8 beats NSVT; b. 30 day event monitor 2015: NSR with rare PVC.   Psoriasis    Syncope and collapse    Vitamin D deficiency    Wears dentures    full upper and lower    Past Surgical History:  Procedure Laterality Date   ABDOMINOPLASTY     tummy tuck ? year    BARIATRIC SURGERY  2001   CARDIAC CATHETERIZATION Left 04/16/2016   Procedure: Left Heart Cath and Coronary Angiography;  Surgeon: Antonieta Iba, MD;  Location: [facility];  Service: Cardiovascular;  Laterality: Left;   CHOLECYSTECTOMY  2001   COLONOSCOPY WITH PROPOFOL N/A 09/28/2018   Procedure: COLONOSCOPY WITH PROPOFOL;  Surgeon: Pasty Spillers, MD;  Location: [facility];  Service: Endoscopy;  Laterality: N/A;   ESOPHAGOGASTRODUODENOSCOPY (EGD) WITH PROPOFOL N/A 09/28/2018   Procedure: ESOPHAGOGASTRODUODENOSCOPY (EGD) WITH BIOPSY;  Surgeon: Pasty Spillers, MD;  Location: [facility];  Service: Endoscopy;  Laterality: N/A;   EXTERNAL FIXATION LEG Left 10/29/2020   Procedure: EXTERNAL FIXATION LEFT KNEE;  Surgeon: Myrene Galas, MD;  Location: [facility] OR;  Service: Orthopedics;  Laterality: Left;   GALLBLADDER SURGERY     GASTRIC BYPASS  2001   GASTROPLASTY      Current Outpatient Medications  Medication Instructions  albuterol (PROAIR HFA) 108 (90 Base) MCG/ACT inhaler 1-2 puffs, Inhalation, Every 6 hours PRN   Biotin 10 mg, Oral, Daily   carvedilol (COREG) 6.25 mg, Oral, 2 times daily with meals, Additional refill available at office visit   Cholecalciferol 1.25 MG (50000 UT) capsule Oral   diclofenac Sodium (VOLTAREN) 1 % GEL Topical   EPINEPHrine (EPI-PEN) 0.3 mg, Intramuscular, As needed   furosemide (LASIX) 20 mg, Oral, Daily PRN   gabapentin (NEURONTIN) 400 MG capsule LIMIT 2 TABLETS IN THE A.M. AND MIDDAY AND 3 TABLETS EACH EVENING   isosorbide mononitrate (IMDUR) 60 MG 24 hr tablet TAKE ONE TABLET (60 mg total) BY MOUTH DAILY AT 9 AM    Magnesium Oxide 400 mg, Oral, Daily   methadone (DOLOPHINE) 10 MG tablet Limit 1 tablets by mouth 3 times per day if tolerated per methadone clinic   mirtazapine (REMERON) 15 MG tablet TAKE 1 TABLET BY MOUTH EVERYDAY AT BEDTIME   nitroGLYCERIN (NITROSTAT) 0.4 MG SL tablet PLACE 1 TABLET UNDER THE TONGUE EVERY 5 MINUTES AS NEEDED FOR CHEST PAIN. AFTER 2ND DOSE CALL 911   rosuvastatin (CRESTOR) 10 MG tablet TAKE ONE TABLET (10 mg total) BY MOUTH DAILY AT 5 PM   tiZANidine (ZANAFLEX) 4 mg, Oral, 2 times daily   Vitamin B-12 1,000 mcg, Sublingual, Daily   Wegovy 2.4 mg, Subcutaneous, Weekly, X 16month    Social History:  The patient  reports that she has never smoked. She has never used smokeless tobacco. She reports that she does not currently use alcohol. She reports that she does not use drugs.   Family History:  *** include only if pertinent The patient's family history includes Arthritis in her brother; Breast cancer in her maternal aunt; Cancer in her maternal aunt, maternal aunt, maternal grandmother, maternal uncle, maternal uncle, mother, and paternal grandmother; Depression in her brother; Diabetes in her brother and paternal grandmother; Early death in her father and mother; Heart attack in her brother, maternal grandmother, and mother; Heart attack (age of onset: 35) in her father; Heart disease in her brother, father, and maternal grandmother; Lung cancer in her maternal grandmother; Pancreatic cancer in her maternal grandmother.***  ROS:  Please see the history of present illness. All other systems are reviewed and otherwise negative.   PHYSICAL EXAM: *** VS:  LMP 02/12/2016 (Approximate)  BMI: There is no height or weight on file to calculate BMI.  GEN- The patient is well appearing, alert and oriented x 3 today.   Lungs- Clear to ausculation bilaterally, normal work of breathing.  Heart- {Blank single:19197::"Regular","Irregularly irregular"} rate and rhythm, no murmurs, rubs or  gallops Extremities- {EDEMA LEVEL:28147::"No"} peripheral edema, warm, dry  EKG is not ordered. Personal review of EKG from {Blank single:19197::"today","***"} shows:  ***  Recent Labs: 11/26/2021: TSH 4.66 06/26/2022: ALT 13; BUN 8; Creatinine, Ser 0.94; Hemoglobin 12.9; Platelets 249; Potassium 4.3; Sodium 138  11/26/2021: Cholesterol 183; HDL 49.00; LDL Cholesterol 112; Total CHOL/HDL Ratio 4; Triglycerides 109.0; VLDL 21.8   Estimated Creatinine Clearance: 90.4 mL/min (by C-G formula based on SCr of 0.94 mg/dL).   Wt Readings from Last 3 Encounters:  06/30/22 272 lb 12.8 oz (123.7 kg)  06/26/22 274 lb 6.4 oz (124.5 kg)  05/16/22 273 lb 8 oz (124.1 kg)     Additional studies reviewed include: Previous EP, cardiology notes.   TTE, 11/07/2021  1. Left ventricular ejection fraction, by estimation, is 55 to 60%. The  left ventricle has normal function. The left ventricle has  no regional wall motion abnormalities. There is moderate left ventricular hypertrophy. No significant LVOT gradient measured. Left ventricular diastolic parameters are consistent with Grade II diastolic dysfunction (pseudonormalization).   2. Right ventricular systolic function is normal. The right ventricular size is normal.   3. Left atrial size was moderately dilated.   4. The mitral valve is normal in structure. Mild mitral valve regurgitation. No evidence of mitral stenosis.   5. The aortic valve is normal in structure. Aortic valve regurgitation is not visualized. Aortic valve sclerosis/calcification is present, without any evidence of aortic stenosis.   6. The inferior vena cava is normal in size with greater than 50%  respiratory variability, suggesting right atrial pressure of 3 mmHg.   Long term monitor, 07/07/2021 HR 42 - 136 bpm, average 60 bpm. Rare supraventricular ectopy, <1%. Frequent ventricular ectopy, 5.8%. No sustained arrhythmias.  Cardiac MRI, 06/12/2020 1. Severe asymmetric septal  hypertrophy, with basal septum measuring upto 1.6 cm.  2. Non-ischemic pattern of late gadolinium enhancement involving the LV mid basal septal wall (areas of maximum hypertrophy) and right ventricular insertion points of the LV walls. Scar/LGE comprises 7.3% of total LV myocardium. 3. There is no evidence of left ventricular outflow tract obstruction or systolic anterior motion of mitral valve.  4. There is normal left and right ventricular size and function. LVEF 51% 5. Findings are consistent with Hypertrophic Cardiomyopathy (HCM), asymmetric septal variant.  ASSESSMENT AND PLAN:  #) HOCM #) NSVT   #) HTN    Current medicines are reviewed at length with the patient today.   The patient {ACTIONS; HAS/DOES NOT HAVE:19233} concerns regarding her medicines.  The following changes were made today:  {NONE DEFAULTED:18576}  Labs/ tests ordered today include: *** No orders of the defined types were placed in this encounter.    Disposition: Follow up with {EPMDS:28135} in {EPFOLLOW UP:28173}   Signed, Sherie Don, NP  07/07/22  2:19 PM  Electrophysiology [facility]

## 2022-07-07 NOTE — Telephone Encounter (Signed)
Cardiac clearance has been granted from Dr. Mariah Milling for 07/21/22 Colonoscopy.  [facility] chart noted on 07/01/22.  Thanks,  [place], [place]

## 2022-07-09 ENCOUNTER — Ambulatory Visit: Payer: Medicare Other | Admitting: Cardiology

## 2022-07-09 ENCOUNTER — Encounter: Payer: Self-pay | Admitting: Gastroenterology

## 2022-07-09 ENCOUNTER — Telehealth: Payer: Self-pay

## 2022-07-09 DIAGNOSIS — I4729 Other ventricular tachycardia: Secondary | ICD-10-CM

## 2022-07-09 DIAGNOSIS — Z79891 Long term (current) use of opiate analgesic: Secondary | ICD-10-CM | POA: Diagnosis not present

## 2022-07-09 DIAGNOSIS — I421 Obstructive hypertrophic cardiomyopathy: Secondary | ICD-10-CM

## 2022-07-09 DIAGNOSIS — I10 Essential (primary) hypertension: Secondary | ICD-10-CM

## 2022-07-09 NOTE — Progress Notes (Deleted)
Cardiology Office Note Date:  07/09/2022  Patient ID:  Cheyenne Gray 1965-08-02, MRN [id number] PCP:  Dana Allan, MD  Cardiologist:  Julien Nordmann, MD Electrophysiologist: Lanier Prude, MD  ***refresh   Chief Complaint: ***  History of Present Illness: Cheyenne Gray is a 57 y.o. female with PMH notable for PVC, HOCM***; seen today for Lanier Prude, MD for routine electrophysiology followup.  Last saw Dr. Lalla Brothers 05/2021, no syncope. Known MYBPC 3 gene mutation. Planning to repeat zio and echo. Both reassuring.   Has history of syncope with prodrome of a hot flash over her head and body and changes to her vision, prodrome lasts for a couple seconds and reliably precedes the syncope.    *** palpitations *** syncope *** annual echo and 2 week zio due   Since last being seen in our clinic the patient reports doing ***.  she denies chest pain, palpitations, dyspnea, PND, orthopnea, nausea, vomiting, dizziness, syncope, edema, weight gain, or early satiety.     Past Medical History:  Diagnosis Date   (HFpEF) heart failure with preserved ejection fraction    a. Echo 2014: EF 65-70%, nl WM, mildly dilated LA, PASP nl; b. 12/2014 Echo: EF 65-70%, no rwma, mod septal hypertrophy w/o LVOT gradient or SAM; c. 07/2017 Echo: EF 55-60%, no rwma, mildly dil RV w/ nl syst fxn. Mildly dil RA. Dilated IVC w/ elevated CVP. Triv post effusion.   Anxiety    Asthma    Chronic pain    on methadone, managed by Dr. Metta Clines   Concussion    hx of 4   Coronary artery disease, non-occlusive    a. LHC 1/18: proximal to mid LAD 40% stenosed, mid LAD 30% stenosed, mid RCA 20% stenosed, distal RCA 20% stenosed, EF 55-65%, LVEDP normal   Depression    DJD (degenerative joint disease), multiple sites    History of shingles    HOCM (hypertrophic obstructive cardiomyopathy)    Hypertension    Iron deficiency anemia    Long QT interval    Obesity    Palpitations    a. 24 hour Holter:  NSR, sinus brady down to 48, occasional PVCs & couplets, 8 beats NSVT; b. 30 day event monitor 2015: NSR with rare PVC.   Psoriasis    Syncope and collapse    Vitamin D deficiency    Wears dentures    full upper and lower    Past Surgical History:  Procedure Laterality Date   ABDOMINOPLASTY     tummy tuck ? year    BARIATRIC SURGERY  2001   CARDIAC CATHETERIZATION Left 04/16/2016   Procedure: Left Heart Cath and Coronary Angiography;  Surgeon: Antonieta Iba, MD;  Location: [facility];  Service: Cardiovascular;  Laterality: Left;   CHOLECYSTECTOMY  2001   COLONOSCOPY WITH PROPOFOL N/A 09/28/2018   Procedure: COLONOSCOPY WITH PROPOFOL;  Surgeon: Pasty Spillers, MD;  Location: [facility];  Service: Endoscopy;  Laterality: N/A;   ESOPHAGOGASTRODUODENOSCOPY (EGD) WITH PROPOFOL N/A 09/28/2018   Procedure: ESOPHAGOGASTRODUODENOSCOPY (EGD) WITH BIOPSY;  Surgeon: Pasty Spillers, MD;  Location: [facility];  Service: Endoscopy;  Laterality: N/A;   EXTERNAL FIXATION LEG Left 10/29/2020   Procedure: EXTERNAL FIXATION LEFT KNEE;  Surgeon: Myrene Galas, MD;  Location: [facility] OR;  Service: Orthopedics;  Laterality: Left;   GALLBLADDER SURGERY     GASTRIC BYPASS  2001   GASTROPLASTY      Current Outpatient Medications  Medication  Instructions   albuterol (PROAIR HFA) 108 (90 Base) MCG/ACT inhaler 1-2 puffs, Inhalation, Every 6 hours PRN   Biotin 10 mg, Oral, Daily   carvedilol (COREG) 6.25 mg, Oral, 2 times daily with meals, Additional refill available at office visit   Cholecalciferol 1.25 MG (50000 UT) capsule Oral   diclofenac Sodium (VOLTAREN) 1 % GEL Topical   EPINEPHrine (EPI-PEN) 0.3 mg, Intramuscular, As needed   furosemide (LASIX) 20 mg, Oral, Daily PRN   gabapentin (NEURONTIN) 400 MG capsule LIMIT 2 TABLETS IN THE A.M. AND MIDDAY AND 3 TABLETS EACH EVENING   isosorbide mononitrate (IMDUR) 60 MG 24 hr tablet TAKE ONE TABLET (60 mg total) BY MOUTH  DAILY AT 9 AM   Magnesium Oxide 400 mg, Oral, Daily   methadone (DOLOPHINE) 10 MG tablet Limit 1 tablets by mouth 3 times per day if tolerated per methadone clinic   mirtazapine (REMERON) 15 MG tablet TAKE 1 TABLET BY MOUTH EVERYDAY AT BEDTIME   nitroGLYCERIN (NITROSTAT) 0.4 MG SL tablet PLACE 1 TABLET UNDER THE TONGUE EVERY 5 MINUTES AS NEEDED FOR CHEST PAIN. AFTER 2ND DOSE CALL 911   rosuvastatin (CRESTOR) 10 MG tablet TAKE ONE TABLET (10 mg total) BY MOUTH DAILY AT 5 PM   tiZANidine (ZANAFLEX) 4 mg, Oral, 2 times daily   Vitamin B-12 1,000 mcg, Sublingual, Daily   Wegovy 2.4 mg, Subcutaneous, Weekly, X 40month    Social History:  The patient  reports that she has never smoked. She has never used smokeless tobacco. She reports that she does not currently use alcohol. She reports that she does not use drugs.   Family History:  *** include only if pertinent The patient's family history includes Arthritis in her brother; Breast cancer in her maternal aunt; Cancer in her maternal aunt, maternal aunt, maternal grandmother, maternal uncle, maternal uncle, mother, and paternal grandmother; Depression in her brother; Diabetes in her brother and paternal grandmother; Early death in her father and mother; Heart attack in her brother, maternal grandmother, and mother; Heart attack (age of onset: 69) in her father; Heart disease in her brother, father, and maternal grandmother; Lung cancer in her maternal grandmother; Pancreatic cancer in her maternal grandmother.***  ROS:  Please see the history of present illness. All other systems are reviewed and otherwise negative.   PHYSICAL EXAM: *** VS:  LMP 02/12/2016 (Approximate)  BMI: There is no height or weight on file to calculate BMI.  GEN- The patient is well appearing, alert and oriented x 3 today.   Lungs- Clear to ausculation bilaterally, normal work of breathing.  Heart- {Blank single:19197::"Regular","Irregularly irregular"} rate and rhythm, no  murmurs, rubs or gallops Extremities- {EDEMA LEVEL:28147::"No"} peripheral edema, warm, dry  EKG is not ordered. Personal review of EKG from {Blank single:19197::"today","***"} shows:  ***  Recent Labs: 11/26/2021: TSH 4.66 06/26/2022: ALT 13; BUN 8; Creatinine, Ser 0.94; Hemoglobin 12.9; Platelets 249; Potassium 4.3; Sodium 138  11/26/2021: Cholesterol 183; HDL 49.00; LDL Cholesterol 112; Total CHOL/HDL Ratio 4; Triglycerides 109.0; VLDL 21.8   Estimated Creatinine Clearance: 90.4 mL/min (by C-G formula based on SCr of 0.94 mg/dL).   Wt Readings from Last 3 Encounters:  06/30/22 272 lb 12.8 oz (123.7 kg)  06/26/22 274 lb 6.4 oz (124.5 kg)  05/16/22 273 lb 8 oz (124.1 kg)     Additional studies reviewed include: Previous EP, cardiology notes.   TTE, 11/07/2021  1. Left ventricular ejection fraction, by estimation, is 55 to 60%. The  left ventricle has normal function. The  left ventricle has no regional wall motion abnormalities. There is moderate left ventricular hypertrophy. No significant LVOT gradient measured. Left ventricular diastolic parameters are consistent with Grade II diastolic dysfunction (pseudonormalization).   2. Right ventricular systolic function is normal. The right ventricular size is normal.   3. Left atrial size was moderately dilated.   4. The mitral valve is normal in structure. Mild mitral valve regurgitation. No evidence of mitral stenosis.   5. The aortic valve is normal in structure. Aortic valve regurgitation is not visualized. Aortic valve sclerosis/calcification is present, without any evidence of aortic stenosis.   6. The inferior vena cava is normal in size with greater than 50%  respiratory variability, suggesting right atrial pressure of 3 mmHg.   Long term monitor, 07/07/2021 HR 42 - 136 bpm, average 60 bpm. Rare supraventricular ectopy, <1%. Frequent ventricular ectopy, 5.8%. No sustained arrhythmias.  Cardiac MRI, 06/12/2020 1. Severe asymmetric  septal hypertrophy, with basal septum measuring upto 1.6 cm.  2. Non-ischemic pattern of late gadolinium enhancement involving the LV mid basal septal wall (areas of maximum hypertrophy) and right ventricular insertion points of the LV walls. Scar/LGE comprises 7.3% of total LV myocardium. 3. There is no evidence of left ventricular outflow tract obstruction or systolic anterior motion of mitral valve.  4. There is normal left and right ventricular size and function. LVEF 51% 5. Findings are consistent with Hypertrophic Cardiomyopathy (HCM), asymmetric septal variant.  ASSESSMENT AND PLAN:  #) HOCM #) NSVT   #) HTN    Current medicines are reviewed at length with the patient today.   The patient {ACTIONS; HAS/DOES NOT HAVE:19233} concerns regarding her medicines.  The following changes were made today:  {NONE DEFAULTED:18576}  Labs/ tests ordered today include: *** No orders of the defined types were placed in this encounter.    Disposition: Follow up with {EPMDS:28135} in {EPFOLLOW UP:28173}   Signed, Cheyenne Don, NP  07/09/22  2:06 PM  Electrophysiology [facility]

## 2022-07-09 NOTE — Telephone Encounter (Signed)
Patient sent mychart message stating Hello I have a may appt I need to cancel and the only available appt was August so if you anything sooner than August that be great. 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 Thank you   Please call patient to reschedule colonoscopy that is schedule for 07/21/2022

## 2022-07-09 NOTE — Telephone Encounter (Signed)
Patient has been contacted to reschedule her 07/21/22 Colonoscopy with Dr. Allegra Lai.  Colonoscopy has been rescheduled to 08/14/22. Instructions updated.  Referral updated. Leslie in Endo notified.  Thanks,  [place], [place]

## 2022-07-17 ENCOUNTER — Encounter: Payer: Medicare Other | Admitting: Licensed Clinical Social Worker

## 2022-07-18 ENCOUNTER — Ambulatory Visit: Payer: Medicare Other | Admitting: Cardiology

## 2022-07-24 ENCOUNTER — Other Ambulatory Visit: Payer: Self-pay | Admitting: Psychiatry

## 2022-07-24 ENCOUNTER — Other Ambulatory Visit: Payer: Self-pay | Admitting: Cardiovascular Disease

## 2022-07-24 DIAGNOSIS — G4701 Insomnia due to medical condition: Secondary | ICD-10-CM

## 2022-07-24 DIAGNOSIS — R55 Syncope and collapse: Secondary | ICD-10-CM

## 2022-07-24 DIAGNOSIS — I2510 Atherosclerotic heart disease of native coronary artery without angina pectoris: Secondary | ICD-10-CM

## 2022-07-28 ENCOUNTER — Encounter: Payer: Self-pay | Admitting: Cardiovascular Disease

## 2022-07-29 ENCOUNTER — Inpatient Hospital Stay: Payer: Medicare Other

## 2022-07-29 ENCOUNTER — Inpatient Hospital Stay: Payer: Medicare Other | Admitting: Licensed Clinical Social Worker

## 2022-08-04 ENCOUNTER — Other Ambulatory Visit: Payer: Self-pay | Admitting: Psychiatry

## 2022-08-04 DIAGNOSIS — F4310 Post-traumatic stress disorder, unspecified: Secondary | ICD-10-CM

## 2022-08-04 DIAGNOSIS — G4701 Insomnia due to medical condition: Secondary | ICD-10-CM

## 2022-08-06 DIAGNOSIS — S82142A Displaced bicondylar fracture of left tibia, initial encounter for closed fracture: Secondary | ICD-10-CM | POA: Diagnosis not present

## 2022-08-06 DIAGNOSIS — H0100A Unspecified blepharitis right eye, upper and lower eyelids: Secondary | ICD-10-CM | POA: Diagnosis not present

## 2022-08-11 DIAGNOSIS — M791 Myalgia, unspecified site: Secondary | ICD-10-CM | POA: Diagnosis not present

## 2022-08-11 DIAGNOSIS — I10 Essential (primary) hypertension: Secondary | ICD-10-CM | POA: Diagnosis not present

## 2022-08-11 DIAGNOSIS — G894 Chronic pain syndrome: Secondary | ICD-10-CM | POA: Diagnosis not present

## 2022-08-11 DIAGNOSIS — M5440 Lumbago with sciatica, unspecified side: Secondary | ICD-10-CM | POA: Diagnosis not present

## 2022-08-11 DIAGNOSIS — G8921 Chronic pain due to trauma: Secondary | ICD-10-CM | POA: Diagnosis not present

## 2022-08-11 DIAGNOSIS — M542 Cervicalgia: Secondary | ICD-10-CM | POA: Diagnosis not present

## 2022-08-11 DIAGNOSIS — Z79891 Long term (current) use of opiate analgesic: Secondary | ICD-10-CM | POA: Diagnosis not present

## 2022-08-18 ENCOUNTER — Ambulatory Visit: Payer: Medicare Other | Admitting: Psychiatry

## 2022-08-26 ENCOUNTER — Encounter: Payer: Medicare Other | Admitting: Licensed Clinical Social Worker

## 2022-08-26 ENCOUNTER — Other Ambulatory Visit: Payer: Medicare Other

## 2022-09-01 ENCOUNTER — Telehealth: Payer: Self-pay

## 2022-09-01 NOTE — Telephone Encounter (Signed)
Per Trish patient forgot her procedure.  Procedure canceled and this is her 3rd reschedule.  Will need to discuss with office mgr before rescheduling again or she will need to have referral sent to another GI Office.  Thanks,  [place], [place]

## 2022-09-02 ENCOUNTER — Ambulatory Visit: Admission: RE | Admit: 2022-09-02 | Payer: Medicare Other | Source: Home / Self Care | Admitting: Gastroenterology

## 2022-09-02 ENCOUNTER — Encounter: Admission: RE | Payer: Self-pay | Source: Home / Self Care

## 2022-09-02 SURGERY — COLONOSCOPY WITH PROPOFOL
Anesthesia: General

## 2022-09-08 DIAGNOSIS — M5440 Lumbago with sciatica, unspecified side: Secondary | ICD-10-CM | POA: Diagnosis not present

## 2022-09-08 DIAGNOSIS — M791 Myalgia, unspecified site: Secondary | ICD-10-CM | POA: Diagnosis not present

## 2022-09-08 DIAGNOSIS — G894 Chronic pain syndrome: Secondary | ICD-10-CM | POA: Diagnosis not present

## 2022-09-08 DIAGNOSIS — Z79891 Long term (current) use of opiate analgesic: Secondary | ICD-10-CM | POA: Diagnosis not present

## 2022-09-08 DIAGNOSIS — M542 Cervicalgia: Secondary | ICD-10-CM | POA: Diagnosis not present

## 2022-09-08 DIAGNOSIS — G8921 Chronic pain due to trauma: Secondary | ICD-10-CM | POA: Diagnosis not present

## 2022-09-08 DIAGNOSIS — I10 Essential (primary) hypertension: Secondary | ICD-10-CM | POA: Diagnosis not present

## 2022-09-30 ENCOUNTER — Other Ambulatory Visit: Payer: Self-pay | Admitting: Psychiatry

## 2022-09-30 DIAGNOSIS — G4701 Insomnia due to medical condition: Secondary | ICD-10-CM

## 2022-10-02 ENCOUNTER — Ambulatory Visit: Payer: Medicare Other | Admitting: Nurse Practitioner

## 2022-10-03 ENCOUNTER — Ambulatory Visit: Payer: Medicare Other | Admitting: Psychiatry

## 2022-10-13 ENCOUNTER — Ambulatory Visit: Payer: Medicare Other | Admitting: Family

## 2022-10-13 ENCOUNTER — Ambulatory Visit: Payer: Medicare Other | Admitting: Physician Assistant

## 2022-10-13 ENCOUNTER — Encounter: Payer: Self-pay | Admitting: Family

## 2022-10-13 VITALS — BP 136/70 | HR 86 | Temp 98.3°F | Ht 64.0 in | Wt 250.6 lb

## 2022-10-13 DIAGNOSIS — E538 Deficiency of other specified B group vitamins: Secondary | ICD-10-CM

## 2022-10-13 DIAGNOSIS — L89219 Pressure ulcer of right hip, unspecified stage: Secondary | ICD-10-CM | POA: Diagnosis not present

## 2022-10-13 MED ORDER — CYANOCOBALAMIN 1000 MCG/ML IJ SOLN
1000.0000 ug | Freq: Once | INTRAMUSCULAR | Status: AC
Start: 2022-10-13 — End: 2022-10-13
  Administered 2022-10-13: 1000 ug via INTRAMUSCULAR
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
## 2022-10-13 NOTE — Patient Instructions (Addendum)
Please ensure that you call wound care in the morning to schedule an appointment.  Let me know once you are scheduled.    I am not as concerned for infection however I am concerned you might need the area to be debrided and start hydrofera blue , silva alginate, or medihoney.  Please keep area covered as you are at risk for infection.  Please also avoid direct pressure.   Nice to meet you.

## 2022-10-14 ENCOUNTER — Ambulatory Visit (INDEPENDENT_AMBULATORY_CARE_PROVIDER_SITE_OTHER): Payer: Medicare Other | Admitting: Psychiatry

## 2022-10-14 ENCOUNTER — Encounter: Payer: Self-pay | Admitting: Psychiatry

## 2022-10-14 VITALS — BP 118/78 | HR 71 | Temp 97.9°F | Ht 64.0 in | Wt 250.0 lb

## 2022-10-14 DIAGNOSIS — F411 Generalized anxiety disorder: Secondary | ICD-10-CM

## 2022-10-14 DIAGNOSIS — G4701 Insomnia due to medical condition: Secondary | ICD-10-CM | POA: Diagnosis not present

## 2022-10-14 DIAGNOSIS — F1590 Other stimulant use, unspecified, uncomplicated: Secondary | ICD-10-CM

## 2022-10-14 DIAGNOSIS — F330 Major depressive disorder, recurrent, mild: Secondary | ICD-10-CM

## 2022-10-14 DIAGNOSIS — F4310 Post-traumatic stress disorder, unspecified: Secondary | ICD-10-CM | POA: Diagnosis not present

## 2022-10-14 MED ORDER — SERTRALINE HCL 25 MG PO TABS
25.0000 mg | ORAL_TABLET | Freq: Every day | ORAL | 1 refills | Status: DC
Start: 2022-10-14 — End: 2022-11-07

## 2022-10-14 NOTE — Patient Instructions (Addendum)
[email or web address]  [email or web address]  [email or web address] [address] [place], [place] [address]  [phone]  [facility] [email or web address] [address], [place], [place] [address]   [phone]  [facility], [facility] [email or web address] [address], [place], [place] [address]  [phone]   [facility] - [phone]  [facility] - [phone]  [facility] - [phone] counseling [phone]  [facility] [phone]   [email or web address] this clinician can offer telehealth and has a sliding scale option  [email or web address] this group also offers sliding scale rates and is based out of [place]  [employer] [facility] has interns who offer sliding scale rates and some of the full time clinicians do, as well. You complete their contact form on their website and the referrals coordinator will help to get connected to someone   Medicaid below :  [facility] [address] [place], [place] [address]  [phone]    [employer] [address] [place], [place] [address]  [phone]    [facility] [address] [place], [place] [address]  [phone]   Sertraline Tablets What is this medication? SERTRALINE (SER tra leen) treats depression, anxiety, obsessive-compulsive disorder (OCD), post-traumatic stress disorder (PTSD), and premenstrual dysphoric disorder (PMDD). It increases the amount of serotonin in the brain, a hormone that helps regulate mood. It belongs to a group of medications called SSRIs. This medicine may be used for other purposes; ask your health care provider or pharmacist if you have questions. COMMON BRAND  NAME(S): Zoloft What should I tell my care team before I take this medication? They need to know if you have any of these conditions: Bleeding disorders Bipolar disorder or a family history of bipolar disorder Frequently drink alcohol Glaucoma Heart disease High blood pressure History of irregular heartbeat History of low levels of calcium, magnesium, or potassium in the blood Liver disease Receiving electroconvulsive therapy Seizures Suicidal thoughts, plans, or attempt by you or a family member Take medications that prevent or treat blood clots Thyroid disease An unusual or allergic reaction to sertraline, other medications, foods, dyes, or preservatives Pregnant or trying to get pregnant Breastfeeding How should I use this medication? Take this medication by mouth with a glass of water. Take it as directed on the prescription label at the same time every day. You can take it with or without food. If it upsets your stomach, take it with food. Do not take your medication more often than directed. Keep taking this medication unless your care team tells you to stop. Stopping it too quickly can cause serious side effects. It can also make your condition worse. A special MedGuide will be given to you by the pharmacist with each prescription and refill. Be sure to read this information carefully each time. Talk to your care team about the use of this medication in children. While it may be prescribed for children as young as 7 years for selected conditions, precautions do apply. Overdosage: If you think you have taken too much of this medicine contact a poison control center or emergency room at once. NOTE: This medicine is only for you. Do not share this medicine with others. What if I miss a dose? If you miss a dose, take it as soon as you can. If it is almost time for your next  dose, take only that dose. Do not take double or extra doses. What may interact with this medication? Do not  take this medication with any of the following: Cisapride Dronedarone Linezolid MAOIs, such as Carbex, Eldepryl, Marplan, Nardil, and Parnate Methylene blue (injected into a vein) Pimozide Thioridazine This medication may also interact with the following: Alcohol Amphetamines Aspirin and aspirin-like medications Certain medications for fungal infections, such as ketoconazole, fluconazole, posaconazole, itraconazole Certain medications for irregular heart beat, such as flecainide, quinidine, propafenone Certain medications for mental health conditions Certain medications for migraine headaches, such as almotriptan, eletriptan, frovatriptan, naratriptan, rizatriptan, sumatriptan, zolmitriptan Certain medications for seizures, such as carbamazepine, valproic acid, phenytoin Certain medications for sleep Certain medications that prevent or treat blood clots, such as warfarin, enoxaparin, dalteparin Cimetidine Digoxin Diuretics Fentanyl Isoniazid Lithium NSAIDs, medications for pain and inflammation, such as ibuprofen or naproxen Other medications that cause heart rhythm changes, such as dofetilide Rasagiline Safinamide Supplements, such as St. John's wort, kava kava, valerian Tolbutamide Tramadol Tryptophan This list may not describe all possible interactions. Give your health care provider a list of all the medicines, herbs, non-prescription drugs, or dietary supplements you use. Also tell them if you smoke, drink alcohol, or use illegal drugs. Some items may interact with your medicine. What should I watch for while using this medication? Tell your care team if your symptoms do not get better or if they get worse. Visit your care team for regular checks on your progress. Because it may take several weeks to see the full effects of this medication, it is important to continue your treatment as prescribed by your care team. Patients and their families should watch out for new or  worsening thoughts of suicide or depression. Also watch out for sudden changes in feelings such as feeling anxious, agitated, panicky, irritable, hostile, aggressive, impulsive, severely restless, overly excited and hyperactive, or not being able to sleep. If this happens, especially at the beginning of treatment or after a change in dose, call your care team. This medication may affect your coordination, reaction time, or judgment. Do not drive or operate machinery until you know how this medication affects you. Sit or stand up slowly to reduce the risk of dizzy or fainting spells. Drinking alcohol with this medication can increase the risk of these side effects. Your mouth may get dry. Chewing sugarless gum or sucking hard candy, and drinking plenty of water may help. Contact your care team if the problem does not go away or is severe. What side effects may I notice from receiving this medication? Side effects that you should report to your care team as soon as possible: Allergic reactions--skin rash, itching, hives, swelling of the face, lips, tongue, or throat Bleeding--bloody or black, tar-like stools, red or dark brown urine, vomiting blood or brown material that looks like coffee grounds, small red or purple spots on skin, unusual bleeding or bruising Heart rhythm changes--fast or irregular heartbeat, dizziness, feeling faint or lightheaded, chest pain, trouble breathing Low sodium level--muscle weakness, fatigue, dizziness, headache, confusion Serotonin syndrome--irritability, confusion, fast or irregular heartbeat, muscle stiffness, twitching muscles, sweating, high fever, seizure, chills, vomiting, diarrhea Sudden eye pain or change in vision such as blurred vision, seeing halos around lights, vision loss Thoughts of suicide or self-harm, worsening mood Side effects that usually do not require medical attention (report these to your care team if they continue or are bothersome): Change in sex  drive or performance Diarrhea Excessive sweating Nausea Tremors or shaking Upset  stomach This list may not describe all possible side effects. Call your doctor for medical advice about side effects. You may report side effects to FDA at 1-800-FDA-1088. Where should I keep my medication? Keep out of the reach of children and pets. Store at room temperature between 20 and 25 degrees C (68 and 77 degrees F). Get rid of any unused medication after the expiration date. To get rid of medications that are no longer needed or expired: Take the medication to a medication take-back program. Check with your pharmacy or law enforcement to find a location. If you cannot return the medication, check the label or package insert to see if the medication should be thrown out in the garbage or flushed down the toilet. If you are not sure, ask your care team. If it is safe to put in the trash, empty the medication out of the container. Mix the medication with cat litter, dirt, coffee grounds, or other unwanted substance. Seal the mixture in a bag or container. Put it in the trash. NOTE: This sheet is a summary. It may not cover all possible information. If you have questions about this medicine, talk to your doctor, pharmacist, or health care provider.  2024 Elsevier/Gold Standard (2021-10-01 00:00:00)

## 2022-10-14 NOTE — Progress Notes (Unsigned)
BH MD OP Progress Note  10/14/2022 5:19 PM Cheyenne Gray  MRN:  [id number]  Chief Complaint:  Chief Complaint  Patient presents with   Follow-up   Anxiety   Depression   Medication Refill   HPI: Cheyenne Gray is a 57 year old Caucasian female, widowed on disability, lives in [place] a history of PTSD, MDD, GAD, insomnia, caffeine use disorder, history of prolonged QT syndrome, hypertrophic cardiomyopathy, vitamin B12 deficiency, multiple other medical problems was evaluated in office today.  Patient today appeared to be extremely restless in session.  Patient reports she is currently struggling with significant anxiety as well as mood swings including irritability.  Patient reports she has a lot going on.  Patient's sister who came in with patient also provided collateral information and reported that patient is currently undergoing a lot of situational stressors.  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  The friend has memory problems and needs a lot of support throughout the day and night.  He does not have any family around and hence patient has taken up a lot of responsibilities to help him out.  This has been extremely stressful for patient although she does not want to stop doing it for this friend.  Patient also reports relationship struggles with her daughter who currently does not have any contact with her.  That also frustrates her.  Patient reports today that she stopped taking venlafaxine since she felt worse on that medication.  Patient reports she is not sure why she keeps getting refills from the pharmacy although she had requested to stop this medication last October, 2023.  However per review of documentation from patient's visit on March 21, 2022 patient had requested that writer place this patient back on the venlafaxine and hence it was sent to the pharmacy.  Patient however reports she has no memory  about that conversation and hence has not been taking the venlafaxine.  She continues to be compliant on the mirtazapine.  She reports she sleeps around 5 hours at night which is a good night of sleep for her.  She does struggle with a history of chronic pain and other medical issues which does have an impact on her sleep.  Patient currently denies any suicidality, homicidality or perceptual disturbances.  Patient agreeable to establish care with a new therapist and is aggravated by the fact that her previous therapist appointments could not be scheduled since she had trouble contacting her previous therapist.  Patient denies any other concerns today.    Visit Diagnosis:    ICD-10-CM   1. PTSD (post-traumatic stress disorder)  F43.10 sertraline (ZOLOFT) 25 MG tablet    2. GAD (generalized anxiety disorder)  F41.1 sertraline (ZOLOFT) 25 MG tablet    3. Mild episode of recurrent major depressive disorder (HCC)  F33.0 sertraline (ZOLOFT) 25 MG tablet    4. Insomnia due to medical condition  G47.01    mood, caffeine use    5. Caffeine use disorder  F15.90    mild      Past Psychiatric History: I have reviewed past psychiatric history from progress note on 11/13/2017.  Past trials of Paxil, Cymbalta, Valium, melatonin, trazodone, Ambien, Wellbutrin, Abilify, venlafaxine-side effects.  Past Medical History:  Past Medical History:  Diagnosis Date   (HFpEF) heart failure with preserved ejection fraction (HCC)    a. Echo 2014: EF 65-70%, nl WM, mildly dilated LA, PASP nl; b. 12/2014  Echo: EF 65-70%, no rwma, mod septal hypertrophy w/o LVOT gradient or SAM; c. 07/2017 Echo: EF 55-60%, no rwma, mildly dil RV w/ nl syst fxn. Mildly dil RA. Dilated IVC w/ elevated CVP. Triv post effusion.   Anxiety    Asthma    Chronic pain    on methadone, managed by Dr. Metta Clines   Concussion    hx of 4   Coronary artery disease, non-occlusive    a. LHC 1/18: proximal to mid LAD 40% stenosed, mid LAD 30%  stenosed, mid RCA 20% stenosed, distal RCA 20% stenosed, EF 55-65%, LVEDP normal   Depression    DJD (degenerative joint disease), multiple sites    History of shingles    HOCM (hypertrophic obstructive cardiomyopathy) (HCC)    Hypertension    Iron deficiency anemia    Long QT interval    Obesity    Palpitations    a. 24 hour Holter: NSR, sinus brady down to 48, occasional PVCs & couplets, 8 beats NSVT; b. 30 day event monitor 2015: NSR with rare PVC.   Psoriasis    Syncope and collapse    Vitamin D deficiency    Wears dentures    full upper and lower    Past Surgical History:  Procedure Laterality Date   ABDOMINOPLASTY     tummy tuck ? year ;she thinks 2012 to 2014   BARIATRIC SURGERY  2001   CARDIAC CATHETERIZATION Left 04/16/2016   Procedure: Left Heart Cath and Coronary Angiography;  Surgeon: Antonieta Iba, MD;  Location: [facility] INVASIVE CV LAB;  Service: Cardiovascular;  Laterality: Left;   CHOLECYSTECTOMY  2001   COLONOSCOPY WITH PROPOFOL N/A 09/28/2018   Procedure: COLONOSCOPY WITH PROPOFOL;  Surgeon: Pasty Spillers, MD;  Location: [facility];  Service: Endoscopy;  Laterality: N/A;   ESOPHAGOGASTRODUODENOSCOPY (EGD) WITH PROPOFOL N/A 09/28/2018   Procedure: ESOPHAGOGASTRODUODENOSCOPY (EGD) WITH BIOPSY;  Surgeon: Pasty Spillers, MD;  Location: [facility];  Service: Endoscopy;  Laterality: N/A;   EXTERNAL FIXATION LEG Left 10/29/2020   Procedure: EXTERNAL FIXATION LEFT KNEE;  Surgeon: Myrene Galas, MD;  Location: [facility] OR;  Service: Orthopedics;  Laterality: Left;   GALLBLADDER SURGERY     GASTRIC BYPASS  2001   GASTROPLASTY      Family Psychiatric History: I have reviewed family psychiatric history from progress note on 11/13/2017.  Family History:  Family History  Problem Relation Age of Onset   Heart attack Mother    Cancer Mother        pancreatitic 53   Early death Mother    Heart attack Father 56       MI   Early death Father     Heart disease Father    Heart attack Brother    Heart disease Brother    Arthritis Brother    Depression Brother    Diabetes Brother    Heart attack Maternal Grandmother    Cancer Maternal Grandmother        pancreatitic    Heart disease Maternal Grandmother    Lung cancer Maternal Grandmother    Pancreatic cancer Maternal Grandmother    Cancer Maternal Uncle        pancreatitic    Cancer Paternal Grandmother        ? type    Diabetes Paternal Grandmother    Cancer Maternal Uncle        pancreatitic    Breast cancer Maternal Aunt    Cancer Maternal Aunt  GYN   Cancer Maternal Aunt        GYN    Social History: I have reviewed social history from progress note on 11/13/2017. Social History   Socioeconomic History   Marital status: Widowed    Spouse name: Not on file   Number of children: 1   Years of education: assoc degree   Highest education level: Associate degree: occupational, Scientist, product/process development, or vocational program  Occupational History   Not on file  Tobacco Use   Smoking status: Never   Smokeless tobacco: Never  Vaping Use   Vaping status: Never Used  Substance and Sexual Activity   Alcohol use: Not Currently    Comment: holidays   Drug use: No   Sexual activity: Not Currently  Other Topics Concern   Not on file  Social History Narrative   Adopted daughter Morrell Riddle [phone] (now lives in [place] going to [place] has apt [place])   Significant other mac [phone], former husband died    Teacher ages 4 and up    Never smoker    No guns   Wears seat belt    No caffeine   Social Determinants of Health   Financial Resource Strain: High Risk (11/06/2017)   Overall Financial Resource Strain (CARDIA)    Difficulty of Paying Living Expenses: Very hard  Food Insecurity: No Food Insecurity (11/06/2017)   Hunger Vital Sign    Worried About Running Out of Food in the Last Year: Never true    Ran Out of Food in the Last Year: Never true  Transportation Needs:  No Transportation Needs (11/06/2017)   PRAPARE - Administrator, Civil Service (Medical): No    Lack of Transportation (Non-Medical): No  Physical Activity: Inactive (11/06/2017)   Exercise Vital Sign    Days of Exercise per Week: 0 days    Minutes of Exercise per Session: 0 min  Stress: Stress Concern Present (11/06/2017)   [employer] of Occupational Health - Occupational Stress Questionnaire    Feeling of Stress : Very much  Social Connections: Socially Isolated (11/06/2017)   Social Connection and Isolation Panel [NHANES]    Frequency of Communication with Friends and Family: Once a week    Frequency of Social Gatherings with Friends and Family: Never    Attends Religious Services: Never    Database administrator or Organizations: No    Attends Banker Meetings: Never    Marital Status: Widowed    Allergies:  Allergies  Allergen Reactions   Covid-19 (Mrna) Vaccine Proofreader) [Covid-19 (Mrna) Vaccine] Itching, Palpitations and Other (See Comments)    Throat closing.    Penicillins Hives, Shortness Of Breath and Rash    Has patient had a PCN reaction causing immediate rash, facial/tongue/throat swelling, SOB or lightheadedness with hypotension: Yes Has patient had a PCN reaction causing severe rash involving mucus membranes or skin necrosis: No Has patient had a PCN reaction that required hospitalization No Has patient had a PCN reaction occurring within the last 10 years: No If all of the above answers are "NO", then may proceed with Cephalosporin use.    Bee Venom    Doxycycline Nausea And Vomiting   Venlafaxine Anxiety    Metabolic Disorder Labs: Lab Results  Component Value Date   HGBA1C 5.1 03/01/2021   MPG 100 03/01/2021   No results found for: "PROLACTIN" Lab Results  Component Value Date   CHOL 183 11/26/2021   TRIG 109.0 11/26/2021  HDL 49.00 11/26/2021   CHOLHDL 4 11/26/2021   VLDL 21.8 11/26/2021   LDLCALC 112 (H) 11/26/2021    LDLCALC 62 03/01/2021   Lab Results  Component Value Date   TSH 4.66 11/26/2021   TSH 3.19 04/18/2021    Therapeutic Level Labs: No results found for: "LITHIUM" No results found for: "VALPROATE" No results found for: "CBMZ"  Current Medications: Current Outpatient Medications  Medication Sig Dispense Refill   albuterol (PROAIR HFA) 108 (90 Base) MCG/ACT inhaler Inhale 1-2 puffs into the lungs every 6 (six) hours as needed for wheezing or shortness of breath. 54 g 3   Biotin 10 MG CAPS Take 10 mg by mouth daily.     carvedilol (COREG) 6.25 MG tablet TAKE ONE TABLET BY MOUTH TWICE DAILY @9AM -5PM With a meal. Additional refill available at office visit 60 tablet 3   Cholecalciferol 1.25 MG (50000 UT) capsule Take by mouth.     Cyanocobalamin (VITAMIN B-12) 1000 MCG SUBL Place 1 tablet (1,000 mcg total) under the tongue daily. 90 tablet 3   diclofenac Sodium (VOLTAREN) 1 % GEL Apply topically.     EPINEPHrine 0.3 mg/0.3 mL IJ SOAJ injection Inject 0.3 mg into the muscle as needed for anaphylaxis. 1 each 2   furosemide (LASIX) 20 MG tablet Take 1 tablet (20 mg total) by mouth daily as needed. 90 tablet 0   gabapentin (NEURONTIN) 400 MG capsule LIMIT 2 TABLETS IN THE A.M. AND MIDDAY AND 3 TABLETS EACH EVENING     isosorbide mononitrate (IMDUR) 60 MG 24 hr tablet TAKE ONE TABLET (60 MG) BY MOUTH DAILY AT 9 AM 90 tablet 2   Magnesium Oxide 400 MG CAPS Take 1 capsule (400 mg total) by mouth daily. 30 capsule 6   methadone (DOLOPHINE) 10 MG tablet Limit 1 tablets by mouth 3 times per day if tolerated per methadone clinic     mirtazapine (REMERON) 15 MG tablet TAKE ONE TABLET BY MOUTH DAILY AT 9 PM AT BEDTIME 90 tablet 1   nitroGLYCERIN (NITROSTAT) 0.4 MG SL tablet PLACE 1 TABLET UNDER THE TONGUE EVERY 5 MINUTES AS NEEDED FOR CHEST PAIN. AFTER 2ND DOSE CALL 911 25 tablet 2   rosuvastatin (CRESTOR) 10 MG tablet TAKE ONE TABLET (10 MG) BY MOUTH DAILY AT 5 PM 90 tablet 2   sertraline (ZOLOFT) 25 MG  tablet Take 1 tablet (25 mg total) by mouth daily with breakfast. 30 tablet 1   tiZANidine (ZANAFLEX) 4 MG tablet Take 4 mg by mouth 2 (two) times daily.     Current Facility-Administered Medications  Medication Dose Route Frequency Provider Last Rate Last Admin   cyanocobalamin ((VITAMIN B-12)) injection 1,000 mcg  1,000 mcg Intramuscular Q30 days McLean-Scocuzza, Pasty Spillers, MD   1,000 mcg at 06/30/22 1513     Musculoskeletal: Strength & Muscle Tone: within normal limits Gait & Station: normal Patient leans: N/A  Psychiatric Specialty Exam: Review of Systems  Psychiatric/Behavioral:  Positive for dysphoric mood and sleep disturbance. The patient is nervous/anxious.     Blood pressure 118/78, pulse 71, temperature 97.9 F (36.6 C), temperature source Temporal, height 5\' 4"  (1.626 m), weight 250 lb (113.4 kg), last menstrual period 02/12/2016, SpO2 96%.Body mass index is 42.91 kg/m.  General Appearance: Fairly Groomed  Eye Contact:  Fair  Speech:  Clear and Coherent  Volume:   loud at times  Mood:  Angry, Anxious, and Irritable  Affect:  Congruent  Thought Process:  Goal Directed and Descriptions of Associations: Intact  Orientation:  Full (Time, Place, and Person)  Thought Content: Logical   Suicidal Thoughts:  No  Homicidal Thoughts:  No  Memory:  Immediate;   Fair Recent;   Fair Remote;   Fair  Judgement:  Fair  Insight:  Fair  Psychomotor Activity:  Increased and Restlessness  Concentration:  Concentration: Fair and Attention Span: Fair  Recall:  [employer] of Knowledge: Fair  Language: Fair  Akathisia:  No  Handed:  Right  AIMS (if indicated): not done  Assets:  Therapist, sports  ADL's:  Intact  Cognition: WNL  Sleep:   restless due to pain, although better    Screenings: AIMS    Flowsheet Row Video Visit from 07/15/2021 in [facility]  AIMS Total Score 0      GAD-7     Flowsheet Row Office Visit from 10/14/2022 in [facility] Office Visit from 10[facility]13/2022 in [facility] [employer] Visit from 06/30/2022 in [facility] [employer] Visit from 03/21/2022 in [facility] Counselor from 05/23/2021 in [facility]  Total GAD-7 Score 20 17 17 18 11       Mini-Mental    Flowsheet Row Office Visit from 10/30/2017 in [facility]  Total Score (max 30 points ) 27      PHQ2-9    Flowsheet Row Office Visit from 10/14/2022 in [facility] Office Visit from 06/30/2022 in [facility] Visit from 03/21/2022 in [facility] Office Visit from 11/26/2021 in [facility] Video Visit from 10/03/2021 in [facility]  PHQ-2 Total Score 5 1 2 2 4   PHQ-9 Total Score 22 -- 7 14 10       Flowsheet Row Office Visit from 10/14/2022 in [facility] Video Visit from 06/20/2022 in [facility] Office Visit from 03/21/2022 in [facility]  C-SSRS RISK CATEGORY No Risk No Risk No Risk        Assessment and Plan: Cheyenne Gray is a 57 year old Caucasian female, widowed, has a history of PTSD, depression, generalized anxiety disorder, multiple medical problems including chronic pain was evaluated in office today.  Patient currently with multiple situational stressors, adverse side effects to venlafaxine and hence noncompliant, will benefit from medication management, psychotherapy sessions, plan as noted below.  Plan PTSD-unstable Patient will benefit from  continuing CBT. Start sertraline 25 mg p.o. daily with breakfast.  GAD-unstable Discontinue venlafaxine due to side effects and noncompliance. Mirtazapine 15 mg p.o. nightly Start sertraline 25 mg p.o. daily with breakfast. Patient encouraged to establish care with a therapist-provided resources.  MDD-unstable Discontinue venlafaxine due to side effects Start sertraline 25 mg p.o. daily with breakfast Continue mirtazapine 15 mg p.o. nightly Patient encouraged to establish care with therapist.  Insomnia-improving Patient does have chronic pain which does have an impact on her sleep. Mirtazapine 15 mg p.o. nightly  Caffeine use disorder,mild-improving Patient to continue to cut back.  Collateral information obtained from friend/sister who came in with patient as noted above.   Collaboration of Care: Collaboration of Care: Referral or follow-up with counselor/therapist AEB patient is to establish care with therapist.  Patient/Guardian was advised Release of Information must be obtained  prior to any record release in order to collaborate their care with an outside provider. Patient/Guardian was advised if they have not already done so to contact the registration department to sign all necessary forms in order for [place] to release information regarding their care.   Consent: Patient/Guardian gives verbal consent for treatment and assignment of benefits for services provided during this visit. Patient/Guardian expressed understanding and agreed to proceed.   Follow-up in clinic in 4 to 6 weeks or sooner if needed.  This note was generated in part or whole with voice recognition software. Voice recognition is usually quite accurate but there are transcription errors that can and very often do occur. I apologize for any typographical errors that were not detected and corrected.    Jomarie Longs, MD 10/16/2022, 8:23 AM

## 2022-10-18 DIAGNOSIS — L89209 Pressure ulcer of unspecified hip, unspecified stage: Secondary | ICD-10-CM | POA: Insufficient documentation

## 2022-10-18 NOTE — Assessment & Plan Note (Signed)
Patient well-appearing and nontoxic in appearance.  Wound does not appear infected.  No systemic features.  Suspected pressure ulcer cleaned today and irrigated with normal saline.  Redressed in office.  Strongly advised follow-up with wound care as wound may require debridement.  Advised patient to offload pressure. She states that she will call and reschedule her appointent with wound care.  She verbalized understanding of all.

## 2022-10-30 ENCOUNTER — Other Ambulatory Visit: Payer: Self-pay | Admitting: Cardiovascular Disease

## 2022-10-30 DIAGNOSIS — R55 Syncope and collapse: Secondary | ICD-10-CM

## 2022-10-30 DIAGNOSIS — I2510 Atherosclerotic heart disease of native coronary artery without angina pectoris: Secondary | ICD-10-CM

## 2022-11-03 ENCOUNTER — Ambulatory Visit: Payer: Medicare Other | Admitting: Gastroenterology

## 2022-11-06 ENCOUNTER — Other Ambulatory Visit: Payer: Self-pay | Admitting: Psychiatry

## 2022-11-06 DIAGNOSIS — F330 Major depressive disorder, recurrent, mild: Secondary | ICD-10-CM

## 2022-11-06 DIAGNOSIS — F411 Generalized anxiety disorder: Secondary | ICD-10-CM

## 2022-11-06 DIAGNOSIS — F4310 Post-traumatic stress disorder, unspecified: Secondary | ICD-10-CM

## 2022-12-03 ENCOUNTER — Ambulatory Visit: Payer: Medicare Other | Admitting: Psychiatry

## 2022-12-23 NOTE — Progress Notes (Unsigned)
  Electrophysiology Office Follow up Visit Note:    Date:  12/24/2022   ID:  Cheyenne Gray, DOB February 20, 1966, MRN [id number]  PCP:  Dana Allan, MD  [facility] Cardiologist:  Julien Nordmann, MD  [facility] Electrophysiologist:  Lanier Prude, MD    Interval History:    Cheyenne Gray is a 57 y.o. female who presents for a follow up visit.   Last seen 05/30/2022. Hx of HCM. No arrhythmia history.  Today she is with her sister in clinic.  No syncopal history.  She is dealing with a lot of stressors at home dealing with family member with dementia.  She is experiencing hot flashes.     Past medical, surgical, social and family history were reviewed.  ROS:   Please see the history of present illness.    All other systems reviewed and are negative.  EKGs/Labs/Other Studies Reviewed:    The following studies were reviewed today:    EKG Interpretation Date/Time:  Wednesday December 24 2022 14:51:14 EDT Ventricular Rate:  66 PR Interval:  208 QRS Duration:  90 QT Interval:  448 QTC Calculation: 469 R Axis:   14  Text Interpretation: Normal sinus rhythm Confirmed by Steffanie Dunn [phone]) on 12/24/2022 2:54:26 PM    Physical Exam:    VS:  BP 130/80 (BP Location: Left Arm, Patient Position: Sitting, Cuff Size: Large)   Pulse 66   Ht 5\' 5"  (1.651 m)   Wt 242 lb 8 oz (110 kg)   LMP 02/12/2016 (Approximate)   SpO2 97%   BMI 40.35 kg/m     Wt Readings from Last 3 Encounters:  12/24/22 242 lb 8 oz (110 kg)  10/13/22 250 lb 9.6 oz (113.7 kg)  06/30/22 272 lb 12.8 oz (123.7 kg)     GEN:  Well nourished, well developed in no acute distress.  Obese CARDIAC: RRR, no murmurs, rubs, gallops RESPIRATORY:  Clear to auscultation without rales, wheezing or rhonchi       ASSESSMENT:    1. HOCM (hypertrophic obstructive cardiomyopathy) (HCC)   2. Class 3 severe obesity due to excess calories with body mass index (BMI) of 40.0 to 44.9 in adult, unspecified  whether serious comorbidity present (HCC)   3. 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  No arrhythmic syncopal history.  No family history of sudden cardiac death.  Normal left ventricular function and a low burden of LGE on cardiac MRI.   - repeat TTE - repeat 1 week zio monitor  #Obesity Weight loss encouraged  #HFpEF NYHA II. Warm and dry. Cont current medications.  #Hot flashes Recommended to follow-up with primary care physician.  Follow up 1 year with APP   Signed, Steffanie Dunn, MD, [facility], [facility] 12/24/2022 3:03 PM    Electrophysiology [facility]

## 2022-12-24 ENCOUNTER — Ambulatory Visit: Payer: Medicare Other

## 2022-12-24 ENCOUNTER — Ambulatory Visit: Payer: Medicare Other | Attending: Cardiology | Admitting: Cardiology

## 2022-12-24 ENCOUNTER — Encounter: Payer: Self-pay | Admitting: Cardiology

## 2022-12-24 VITALS — BP 130/80 | HR 66 | Ht 65.0 in | Wt 242.5 lb

## 2022-12-24 DIAGNOSIS — I5032 Chronic diastolic (congestive) heart failure: Secondary | ICD-10-CM

## 2022-12-24 DIAGNOSIS — I421 Obstructive hypertrophic cardiomyopathy: Secondary | ICD-10-CM | POA: Diagnosis not present

## 2022-12-24 DIAGNOSIS — E66813 Obesity, class 3: Secondary | ICD-10-CM

## 2022-12-24 DIAGNOSIS — Z6841 Body Mass Index (BMI) 40.0 and over, adult: Secondary | ICD-10-CM

## 2022-12-24 NOTE — Patient Instructions (Signed)
Medication Instructions:  Your physician recommends that you continue on your current medications as directed. Please refer to the Current Medication list given to you today.  *If you need a refill on your cardiac medications before your next appointment, please call your pharmacy*   Testing/Procedures: Your physician has requested that you have an echocardiogram. Echocardiography is a painless test that uses sound waves to create images of your heart. It provides your doctor with information about the size and shape of your heart and how well your heart's chambers and valves are working. This procedure takes approximately one hour. There are no restrictions for this procedure. Please do NOT wear cologne, perfume, aftershave, or lotions (deodorant is allowed). Please arrive 15 minutes prior to your appointment time.  Your physician has recommended that you wear an event monitor. Event monitors are medical devices that record the heart's electrical activity. Doctors most often [place] these monitors to diagnose arrhythmias. Arrhythmias are problems with the speed or rhythm of the heartbeat. The monitor is a small, portable device. You can wear one while you do your normal daily activities. This is usually used to diagnose what is causing palpitations/syncope (passing out).  Follow-Up: At [facility], you and your health needs are our priority.  As part of our continuing mission to provide you with exceptional heart care, we have created designated Provider Care Teams.  These Care Teams include your primary Cardiologist (physician) and Advanced Practice Providers (APPs -  Physician Assistants and Nurse Practitioners) who all work together to provide you with the care you need, when you need it.   Your next appointment:   1 year  Provider:   You will see one of the following Advanced Practice Providers on your designated Care Team:   Francis Dowse, Charlott Holler "Mardelle Matte" Sherrill, [place] Sherie Don, NP Canary Brim, NP   Other Instructions Christena Deem- Long Term Monitor Instructions  Your physician has requested you wear a ZIO patch monitor for 7 days.  This is a single patch monitor. [employer] supplies one patch monitor per enrollment. Additional stickers are not available. Please do not apply patch if you will be having a Nuclear Stress Test,  Echocardiogram, Cardiac CT, MRI, or Chest Xray during the period you would be wearing the  monitor. The patch cannot be worn during these tests. You cannot remove and re-apply the  ZIO XT patch monitor.  Your ZIO patch monitor will be mailed 3 day USPS to your address on file. It may take 3-5 days  to receive your monitor after you have been enrolled.  Once you have received your monitor, please review the enclosed instructions. Your monitor  has already been registered assigning a specific monitor serial # to you.  Billing and Patient Assistance Program Information  We have supplied [employer] with any of your insurance information on file for billing purposes. [employer] offers a sliding scale Patient Assistance Program for patients that do not have  insurance, or whose insurance does not completely cover the cost of the ZIO monitor.  You must apply for the Patient Assistance Program to qualify for this discounted rate.  To apply, please call [employer] at [phone], select option 4, select option 2, ask to apply for  Patient Assistance Program. Meredeth Ide will ask your household income, and how many people  are in your household. They will quote your out-of-pocket cost based on that information.  [employer] will also be able to set up a 68-month, interest-free payment plan if needed.  Applying the monitor   Shave hair from upper left chest.  Hold abrader disc by orange tab. Rub abrader in 40 strokes over the upper left chest as  indicated in your monitor instructions.  Clean area with 4 enclosed alcohol pads. Let dry.  Apply patch as indicated  in monitor instructions. Patch will be placed under collarbone on left  side of chest with arrow pointing upward.  Rub patch adhesive wings for 2 minutes. Remove white label marked "1". Remove the white  label marked "2". Rub patch adhesive wings for 2 additional minutes.  While looking in a mirror, press and release button in center of patch. A small green light will  flash 3-4 times. This will be your only indicator that the monitor has been turned on.  Do not shower for the first 24 hours. You may shower after the first 24 hours.  Press the button if you feel a symptom. You will hear a small click. Record Date, Time and  Symptom in the Patient Logbook.  When you are ready to remove the patch, follow instructions on the last 2 pages of Patient  Logbook. Stick patch monitor onto the last page of Patient Logbook.  Place Patient Logbook in the blue and white box. Use locking tab on box and tape box closed  securely. The blue and white box has prepaid postage on it. Please place it in the mailbox as  soon as possible. Your physician should have your test results approximately 7 days after the  monitor has been mailed back to [facility].  Call [facility] Customer Care at [phone] if you have questions regarding  your ZIO XT patch monitor. Call them immediately if you see an orange light blinking on your  monitor.  If your monitor falls off in less than 4 days, contact our Monitor department at [phone].  If your monitor becomes loose or falls off after 4 days call [employer] at [phone] for  suggestions on securing your monitor

## 2022-12-24 NOTE — Addendum Note (Signed)
Addended by: Frutoso Schatz on: 12/24/2022 03:18 PM   Modules accepted: Orders

## 2022-12-28 DIAGNOSIS — E66813 Obesity, class 3: Secondary | ICD-10-CM | POA: Diagnosis not present

## 2022-12-28 DIAGNOSIS — I421 Obstructive hypertrophic cardiomyopathy: Secondary | ICD-10-CM

## 2022-12-28 DIAGNOSIS — I5032 Chronic diastolic (congestive) heart failure: Secondary | ICD-10-CM | POA: Diagnosis not present

## 2022-12-28 DIAGNOSIS — Z6841 Body Mass Index (BMI) 40.0 and over, adult: Secondary | ICD-10-CM

## 2022-12-29 ENCOUNTER — Inpatient Hospital Stay: Payer: Medicare Other

## 2022-12-29 ENCOUNTER — Inpatient Hospital Stay: Payer: Medicare Other | Admitting: Oncology

## 2023-01-14 ENCOUNTER — Ambulatory Visit: Payer: Medicare Other

## 2023-01-16 ENCOUNTER — Other Ambulatory Visit: Payer: Self-pay

## 2023-01-16 DIAGNOSIS — D508 Other iron deficiency anemias: Secondary | ICD-10-CM

## 2023-01-19 ENCOUNTER — Inpatient Hospital Stay: Payer: Medicare Other | Attending: Oncology

## 2023-01-19 ENCOUNTER — Encounter: Payer: Self-pay | Admitting: Oncology

## 2023-01-19 ENCOUNTER — Inpatient Hospital Stay (HOSPITAL_BASED_OUTPATIENT_CLINIC_OR_DEPARTMENT_OTHER): Payer: Medicare Other | Admitting: Oncology

## 2023-01-19 VITALS — BP 130/81 | HR 59 | Temp 96.7°F | Resp 18 | Wt 241.4 lb

## 2023-01-19 DIAGNOSIS — D508 Other iron deficiency anemias: Secondary | ICD-10-CM | POA: Insufficient documentation

## 2023-01-19 DIAGNOSIS — Z9884 Bariatric surgery status: Secondary | ICD-10-CM | POA: Diagnosis not present

## 2023-01-19 DIAGNOSIS — R911 Solitary pulmonary nodule: Secondary | ICD-10-CM | POA: Insufficient documentation

## 2023-01-19 DIAGNOSIS — R918 Other nonspecific abnormal finding of lung field: Secondary | ICD-10-CM

## 2023-01-19 DIAGNOSIS — Z809 Family history of malignant neoplasm, unspecified: Secondary | ICD-10-CM

## 2023-01-19 LAB — CBC WITH DIFFERENTIAL (CANCER CENTER ONLY)
Abs Immature Granulocytes: 0.01 10*3/uL (ref 0.00–0.07)
Basophils Absolute: 0 10*3/uL (ref 0.0–0.1)
Basophils Relative: 1 %
Eosinophils Absolute: 0.1 10*3/uL (ref 0.0–0.5)
Eosinophils Relative: 2 %
HCT: 36.7 % (ref 36.0–46.0)
Hemoglobin: 11.8 g/dL — ABNORMAL LOW (ref 12.0–15.0)
Immature Granulocytes: 0 %
Lymphocytes Relative: 39 %
Lymphs Abs: 2 10*3/uL (ref 0.7–4.0)
MCH: 29.7 pg (ref 26.0–34.0)
MCHC: 32.2 g/dL (ref 30.0–36.0)
MCV: 92.4 fL (ref 80.0–100.0)
Monocytes Absolute: 0.4 10*3/uL (ref 0.1–1.0)
Monocytes Relative: 7 %
Neutro Abs: 2.5 10*3/uL (ref 1.7–7.7)
Neutrophils Relative %: 51 %
Platelet Count: 177 10*3/uL (ref 150–400)
RBC: 3.97 MIL/uL (ref 3.87–5.11)
RDW: 14.9 % (ref 11.5–15.5)
WBC Count: 5 10*3/uL (ref 4.0–10.5)
nRBC: 0 % (ref 0.0–0.2)

## 2023-01-19 LAB — IRON AND TIBC
Iron: 78 ug/dL (ref 28–170)
Saturation Ratios: 23 % (ref 10.4–31.8)
TIBC: 333 ug/dL (ref 250–450)
UIBC: 255 ug/dL

## 2023-01-19 LAB — FERRITIN: Ferritin: 59 ng/mL (ref 11–307)

## 2023-01-19 LAB — VITAMIN B12: Vitamin B-12: 480 pg/mL (ref 180–914)

## 2023-01-19 MED ORDER — B-12 2500 MCG SL SUBL: 2500.0000 ug | SUBLINGUAL_TABLET | Freq: Every day | SUBLINGUAL | 6 refills | Status: AC

## 2023-01-19 NOTE — Assessment & Plan Note (Signed)
Refer to genetic counselor.  

## 2023-01-19 NOTE — Assessment & Plan Note (Addendum)
Check vitamin B12  Previous B12 level is borderline. She gets B12 injections every 6 months  Recommend patient to try sublingual B12 daily.

## 2023-01-19 NOTE — Assessment & Plan Note (Addendum)
#  History of iron deficiency anemia.  In the context of gastric bypass. Lab Results  Component Value Date   HGB 11.8 (L) 01/19/2023   TIBC 333 01/19/2023   IRONPCTSAT 23 01/19/2023   FERRITIN 59 01/19/2023    Given her history of gastric bypass and borderline iron saturation, I recommend 1 dose of Venofer for maintenance.

## 2023-01-19 NOTE — Progress Notes (Signed)
Pt here for follow-up. Pt reports feeling tired.  

## 2023-01-19 NOTE — Progress Notes (Signed)
Hematology/Oncology  Follow up note [facility] [phone] [phone]     REASON FOR VISIT Follow-up for iron deficiency anemia, history of gastric bypass  ASSESSMENT & PLAN:   IDA (iron deficiency anemia) #History of iron deficiency anemia.  In the context of gastric bypass. Lab Results  Component Value Date   HGB 11.8 (L) 01/19/2023   TIBC 333 01/19/2023   IRONPCTSAT 23 01/19/2023   FERRITIN 59 01/19/2023    Given her history of gastric bypass and borderline iron saturation, I recommend 1 dose of Venofer for maintenance.  H/O gastric bypass Check vitamin B12  Previous B12 level is borderline. She gets B12 injections every 6 months  Recommend patient to try sublingual B12 daily.   Family history of cancer Refer to genetic counselor.   Lung nodules Patient is very concerned about lung nodules and prefers to repeat CT chest for follow up  Obtain CT chest wo     Orders Placed This Encounter  Procedures   CT Chest Wo Contrast    Standing Status:   Future    Standing Expiration Date:   01/19/2024    Order Specific Question:   Is patient pregnant?    Answer:   No    Order Specific Question:   Preferred imaging location?    Answer:    [facility]   CBC with Differential (Cancer Center Only)    Standing Status:   Future    Standing Expiration Date:   01/19/2024   Iron and TIBC    Standing Status:   Future    Standing Expiration Date:   01/19/2024   Ferritin    Standing Status:   Future    Standing Expiration Date:   01/19/2024   Vitamin B12    Standing Status:   Future    Standing Expiration Date:   01/19/2024   Retic Panel    Standing Status:   Future    Standing Expiration Date:   01/19/2024   Follow-up in 6 months. All questions were answered. The patient knows to call the clinic with any problems, questions or concerns.  Rickard Patience, MD, PhD [facility] Hematology Oncology 01/19/2023    HISTORY OF  PRESENTING ILLNESS:  This is a patient who used to follow up with Dr. Sherrlyn Hock presents for follow-up of management of her anemia.   History of gastric bypass in 2001 Patient was last seen by Dr. Sherrlyn Hock in July 2016 for iron deficiency.  She was again being seen by nurse practitioner on August 21, 2015 for follow-up of her iron deficiency anemia.  She has a history of iron deficiency anemia remote gastric bypass history in 2001.  She used to IV iron infusion with venofer 100mg  Every 3 months but she has a follow-up with [place] for about a year.  Patient reports feeling fatigued.  She did not have much of appetite.  Denies any nausea vomiting diarrhea or abdominal pain.  She has no unintentional weight loss, instead she feels that she has gained a few pounds.  # . She follows up with cardiology for CAD, long QT syndrome, chest pain, lower extremity edema.  INTERVAL HISTORY Seleste SAUNDRA GIN is a 57 y.o. female who has above history reviewed by me today presents for follow up visit for management of lung nodule, history of iron deficiency anemia due to gastric bypass.  .  Patient was last seen today by me.  Virtual visit in February 2021. Recently she went to emergency  room at [facility] for evaluation of chest pain.  Patient had CT PE as well as abdomen scan done in the emergency room. 01/31/2020, CT chest PE protocol showed no PE.  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  Tiny punctated bilateral pulmonary nodules-3 mm nodule near the left lung base, punctate nodule in the right lower lobe.  Mild bronchial wall thickening.  Patulous fluid-filled esophagus. CT abdomen pelvis with contrast showed no etiology of abdominal pain was identified.  Mild intra and extra hepatic bile duct dilatation which can be seen status post cholecystectomy.   INTERVAL HISTORY Zynasia MAGDA MUISE is a 57 y.o. female who has above history reviewed by me today presents to reestablish care Patient was last  seen in 2021, and lost follow-up after that visit. Patient reports feeling more tired and fatigued recently.  Previously she tolerated iron infusion very well. Patient follows up with cardiology for Hypertrophic cardiomyopathy, MYBPC 3 gene. Patient reports extensive family history of cancer.  She is concerned about lung nodules that was detected on her CT scan on 01/31/2020-3 mm nodule left lung base, punctate nodule right lower lobe.  She denies history of smoking.  Review of Systems  Constitutional:  Positive for malaise/fatigue. Negative for chills, fever and weight loss.  HENT:  Negative for hearing loss and nosebleeds.   Eyes:  Negative for photophobia and pain.  Respiratory:  Negative for cough and sputum production.   Cardiovascular:  Negative for chest pain and palpitations.  Gastrointestinal:  Negative for abdominal pain, heartburn, nausea and vomiting.  Genitourinary:  Negative for dysuria.  Musculoskeletal:  Negative for myalgias.  Skin:  Negative for rash.  Neurological:  Negative for dizziness.  Endo/Heme/Allergies:  Does not bruise/bleed easily.  Psychiatric/Behavioral:  Negative for depression.     MEDICAL HISTORY:  Past Medical History:  Diagnosis Date   (HFpEF) heart failure with preserved ejection fraction (HCC)    a. Echo 2014: EF 65-70%, nl WM, mildly dilated LA, PASP nl; b. 12/2014 Echo: EF 65-70%, no rwma, mod septal hypertrophy w/o LVOT gradient or SAM; c. 07/2017 Echo: EF 55-60%, no rwma, mildly dil RV w/ nl syst fxn. Mildly dil RA. Dilated IVC w/ elevated CVP. Triv post effusion.   Anxiety    Asthma    Chronic pain    on methadone, managed by Dr. Metta Clines   Concussion    hx of 4   Coronary artery disease, non-occlusive    a. LHC 1/18: proximal to mid LAD 40% stenosed, mid LAD 30% stenosed, mid RCA 20% stenosed, distal RCA 20% stenosed, EF 55-65%, LVEDP normal   Depression    DJD (degenerative joint disease), multiple sites    History of shingles    HOCM  (hypertrophic obstructive cardiomyopathy) (HCC)    Hypertension    Iron deficiency anemia    Long QT interval    Obesity    Palpitations    a. 24 hour Holter: NSR, sinus brady down to 48, occasional PVCs & couplets, 8 beats NSVT; b. 30 day event monitor 2015: NSR with rare PVC.   Psoriasis    Syncope and collapse    Vitamin D deficiency    Wears dentures    full upper and lower    SURGICAL HISTORY: Past Surgical History:  Procedure Laterality Date   ABDOMINOPLASTY     tummy tuck ? year ;she thinks 2012 to 2014   BARIATRIC SURGERY  2001   CARDIAC CATHETERIZATION Left 04/16/2016   Procedure: Left Heart  Cath and Coronary Angiography;  Surgeon: Antonieta Iba, MD;  Location: [facility] INVASIVE CV LAB;  Service: Cardiovascular;  Laterality: Left;   CHOLECYSTECTOMY  2001   COLONOSCOPY WITH PROPOFOL N/A 09/28/2018   Procedure: COLONOSCOPY WITH PROPOFOL;  Surgeon: Pasty Spillers, MD;  Location: [facility];  Service: Endoscopy;  Laterality: N/A;   ESOPHAGOGASTRODUODENOSCOPY (EGD) WITH PROPOFOL N/A 09/28/2018   Procedure: ESOPHAGOGASTRODUODENOSCOPY (EGD) WITH BIOPSY;  Surgeon: Pasty Spillers, MD;  Location: [facility];  Service: Endoscopy;  Laterality: N/A;   EXTERNAL FIXATION LEG Left 10/29/2020   Procedure: EXTERNAL FIXATION LEFT KNEE;  Surgeon: Myrene Galas, MD;  Location: [facility] OR;  Service: Orthopedics;  Laterality: Left;   GALLBLADDER SURGERY     GASTRIC BYPASS  2001   GASTROPLASTY      SOCIAL HISTORY: Social History   Socioeconomic History   Marital status: Widowed    Spouse name: Not on file   Number of children: 1   Years of education: assoc degree   Highest education level: Associate degree: occupational, Scientist, product/process development, or vocational program  Occupational History   Not on file  Tobacco Use   Smoking status: Never   Smokeless tobacco: Never  Vaping Use   Vaping status: Never Used  Substance and Sexual Activity   Alcohol use: Not Currently     Comment: holidays   Drug use: No   Sexual activity: Not Currently  Other Topics Concern   Not on file  Social History Narrative   Adopted daughter Morrell Riddle [phone] (now lives in [place] going to [place] has apt there)   Significant other mac [phone], former husband died    Teacher ages 4 and up    Never smoker    No guns   Wears seat belt    No caffeine   Social Determinants of Health   Financial Resource Strain: High Risk (11/06/2017)   Overall Financial Resource Strain (CARDIA)    Difficulty of Paying Living Expenses: Very hard  Food Insecurity: No Food Insecurity (11/06/2017)   Hunger Vital Sign    Worried About Running Out of Food in the Last Year: Never true    Ran Out of Food in the Last Year: Never true  Transportation Needs: No Transportation Needs (11/06/2017)   PRAPARE - Administrator, Civil Service (Medical): No    Lack of Transportation (Non-Medical): No  Physical Activity: Inactive (11/06/2017)   Exercise Vital Sign    Days of Exercise per Week: 0 days    Minutes of Exercise per Session: 0 min  Stress: Stress Concern Present (11/06/2017)   [employer] of Occupational Health - Occupational Stress Questionnaire    Feeling of Stress : Very much  Social Connections: Socially Isolated (11/06/2017)   Social Connection and Isolation Panel [NHANES]    Frequency of Communication with Friends and Family: Once a week    Frequency of Social Gatherings with Friends and Family: Never    Attends Religious Services: Never    Database administrator or Organizations: No    Attends Banker Meetings: Never    Marital Status: Widowed  Intimate Partner Violence: Not At Risk (11/06/2017)   Humiliation, Afraid, Rape, and Kick questionnaire    Fear of Current or Ex-Partner: No    Emotionally Abused: No    Physically Abused: No    Sexually Abused: No    FAMILY HISTORY: Family History  Problem Relation Age of Onset   Heart attack Mother  Cancer  Mother        pancreatitic 22   Early death Mother    Heart attack Father 63       MI   Early death Father    Heart disease Father    Heart attack Brother    Heart disease Brother    Arthritis Brother    Depression Brother    Diabetes Brother    Heart attack Maternal Grandmother    Cancer Maternal Grandmother        pancreatitic    Heart disease Maternal Grandmother    Lung cancer Maternal Grandmother    Pancreatic cancer Maternal Grandmother    Cancer Maternal Uncle        pancreatitic    Cancer Paternal Grandmother        ? type    Diabetes Paternal Grandmother    Cancer Maternal Uncle        pancreatitic    Breast cancer Maternal Aunt    Cancer Maternal Aunt        GYN   Cancer Maternal Aunt        GYN    ALLERGIES:  is allergic to covid-19 (mrna) vaccine (pfizer) [covid-19 (mrna) vaccine], penicillins, bee venom, doxycycline, and venlafaxine.  MEDICATIONS:  Current Outpatient Medications  Medication Sig Dispense Refill   albuterol (PROAIR HFA) 108 (90 Base) MCG/ACT inhaler Inhale 1-2 puffs into the lungs every 6 (six) hours as needed for wheezing or shortness of breath. 54 g 3   carvedilol (COREG) 6.25 MG tablet Take 1 tablet (6.25 mg total) by mouth 2 (two) times daily with a meal. 60 tablet 7   Cyanocobalamin (B-12) 2500 MCG SUBL Place 2,500 mcg under the tongue daily. 30 tablet 6   diclofenac Sodium (VOLTAREN) 1 % GEL Apply topically.     gabapentin (NEURONTIN) 400 MG capsule LIMIT 2 TABLETS IN THE A.M. AND MIDDAY AND 3 TABLETS EACH EVENING     isosorbide mononitrate (IMDUR) 60 MG 24 hr tablet TAKE ONE TABLET (60 MG) BY MOUTH DAILY AT 9 AM 90 tablet 2   Magnesium Oxide 400 MG CAPS Take 1 capsule (400 mg total) by mouth daily. 30 capsule 6   methadone (DOLOPHINE) 10 MG tablet Limit 1 tablets by mouth 3 times per day if tolerated per methadone clinic     mirtazapine (REMERON) 15 MG tablet TAKE ONE TABLET BY MOUTH DAILY AT 9 PM AT BEDTIME 90 tablet 1   rosuvastatin  (CRESTOR) 10 MG tablet TAKE ONE TABLET (10 MG) BY MOUTH DAILY AT 5 PM 90 tablet 2   tiZANidine (ZANAFLEX) 4 MG tablet Take 4 mg by mouth 2 (two) times daily.     EPINEPHrine 0.3 mg/0.3 mL IJ SOAJ injection Inject 0.3 mg into the muscle as needed for anaphylaxis. (Patient not taking: Reported on 01/19/2023) 1 each 2   furosemide (LASIX) 20 MG tablet Take 1 tablet (20 mg total) by mouth daily as needed. (Patient not taking: Reported on 01/19/2023) 90 tablet 0   nitroGLYCERIN (NITROSTAT) 0.4 MG SL tablet PLACE 1 TABLET UNDER THE TONGUE EVERY 5 MINUTES AS NEEDED FOR CHEST PAIN. AFTER 2ND DOSE CALL 911 (Patient not taking: Reported on 01/19/2023) 25 tablet 2   Current Facility-Administered Medications  Medication Dose Route Frequency Provider Last Rate Last Admin   cyanocobalamin ((VITAMIN B-12)) injection 1,000 mcg  1,000 mcg Intramuscular Q30 days McLean-Scocuzza, Pasty Spillers, MD   1,000 mcg at 06/30/22 1513     PHYSICAL EXAMINATION: ECOG PERFORMANCE STATUS:  1 - Symptomatic but completely ambulatory Vitals:   01/19/23 1430  BP: 130/81  Pulse: (!) 59  Resp: 18  Temp: (!) 96.7 F (35.9 C)   Filed Weights   01/19/23 1430  Weight: 241 lb 6.4 oz (109.5 kg)    Physical Exam Constitutional:      General: She is not in acute distress.    Appearance: She is not diaphoretic.     Comments: Obese  HENT:     Head: Normocephalic and atraumatic.     Nose: Nose normal.     Mouth/Throat:     Pharynx: No oropharyngeal exudate.  Eyes:     General: No scleral icterus.       Left eye: No discharge.     Conjunctiva/sclera: Conjunctivae normal.     Pupils: Pupils are equal, round, and reactive to light.  Neck:     Vascular: No JVD.  Cardiovascular:     Rate and Rhythm: Normal rate and regular rhythm.     Heart sounds: Normal heart sounds. No murmur heard. Pulmonary:     Effort: Pulmonary effort is normal. No respiratory distress.     Breath sounds: Normal breath sounds. No wheezing or rales.  Chest:      Chest wall: No tenderness.  Abdominal:     General: Bowel sounds are normal. There is no distension.     Palpations: Abdomen is soft. There is no mass.     Tenderness: There is no abdominal tenderness. There is no rebound.  Musculoskeletal:        General: No tenderness or deformity. Normal range of motion.     Cervical back: Normal range of motion and neck supple.  Lymphadenopathy:     Cervical: No cervical adenopathy.  Skin:    General: Skin is warm and dry.     Findings: No erythema or rash.  Neurological:     Mental Status: She is alert and oriented to person, place, and time.     Cranial Nerves: No cranial nerve deficit.     Motor: No abnormal muscle tone.     Coordination: Coordination normal.  Psychiatric:        Mood and Affect: Affect normal.        Judgment: Judgment normal.      LABORATORY DATA:  I have reviewed the data as listed Lab Results  Component Value Date   WBC 5.0 01/19/2023   HGB 11.8 (L) 01/19/2023   HCT 36.7 01/19/2023   MCV 92.4 01/19/2023   PLT 177 01/19/2023      Latest Ref Rng & Units 06/26/2022    2:16 PM 12/16/2021    9:34 AM 11/26/2021    8:43 AM  CMP  Glucose 70 - 99 mg/dL 90   77   BUN 6 - 20 mg/dL 8   9   Creatinine 4.09 - 1.00 mg/dL 8.11   9.14   Sodium 782 - 145 mmol/L 138   141   Potassium 3.5 - 5.1 mmol/L 4.3   4.0   Chloride 98 - 111 mmol/L 103   102   CO2 22 - 32 mmol/L 27   31   Calcium 8.9 - 10.3 mg/dL 8.7   9.1   Total Protein 6.5 - 8.1 g/dL 7.6   6.9   Total Bilirubin 0.3 - 1.2 mg/dL 0.6   0.5   Alkaline Phos 38 - 126 U/L 139  144  155   AST 15 - 41 U/L 27   27  ALT 0 - 44 U/L 13   17      Lab Results  Component Value Date   IRON 78 01/19/2023   TIBC 333 01/19/2023   IRONPCTSAT 23 01/19/2023   FERRITIN 59 01/19/2023

## 2023-01-19 NOTE — Assessment & Plan Note (Signed)
Patient is very concerned about lung nodules and prefers to repeat CT chest for follow up  Obtain CT chest wo

## 2023-01-28 ENCOUNTER — Other Ambulatory Visit: Payer: Self-pay | Admitting: Psychiatry

## 2023-01-28 DIAGNOSIS — F4310 Post-traumatic stress disorder, unspecified: Secondary | ICD-10-CM

## 2023-01-28 DIAGNOSIS — G4701 Insomnia due to medical condition: Secondary | ICD-10-CM

## 2023-01-29 ENCOUNTER — Telehealth: Payer: Self-pay | Admitting: Psychiatry

## 2023-01-29 NOTE — Telephone Encounter (Signed)
Noted  

## 2023-01-29 NOTE — Telephone Encounter (Signed)
Patient last seen 10/14/2022.  Patient at that visit was advised to return in 4 weeks.  I do not see an appointment scheduled.

## 2023-01-30 ENCOUNTER — Encounter: Payer: Self-pay | Admitting: Family Medicine

## 2023-01-30 ENCOUNTER — Other Ambulatory Visit: Payer: Self-pay

## 2023-01-30 DIAGNOSIS — F32A Depression, unspecified: Secondary | ICD-10-CM

## 2023-02-02 ENCOUNTER — Encounter: Payer: Self-pay | Admitting: Licensed Clinical Social Worker
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
## 2023-02-03 ENCOUNTER — Inpatient Hospital Stay: Payer: Medicare Other | Admitting: Licensed Clinical Social Worker

## 2023-02-03 ENCOUNTER — Inpatient Hospital Stay: Payer: Medicare Other

## 2023-02-04 ENCOUNTER — Ambulatory Visit: Payer: Medicare Other

## 2023-02-05 ENCOUNTER — Other Ambulatory Visit: Payer: Self-pay | Admitting: Psychiatry

## 2023-02-05 DIAGNOSIS — F4310 Post-traumatic stress disorder, unspecified: Secondary | ICD-10-CM

## 2023-02-05 DIAGNOSIS — F330 Major depressive disorder, recurrent, mild: Secondary | ICD-10-CM

## 2023-02-05 DIAGNOSIS — F411 Generalized anxiety disorder: Secondary | ICD-10-CM

## 2023-02-06 ENCOUNTER — Encounter: Payer: Self-pay | Admitting: Family Medicine

## 2023-02-06 ENCOUNTER — Telehealth (INDEPENDENT_AMBULATORY_CARE_PROVIDER_SITE_OTHER): Payer: Medicare Other | Admitting: Family Medicine

## 2023-02-06 VITALS — Ht 65.0 in | Wt 241.4 lb

## 2023-02-06 DIAGNOSIS — R413 Other amnesia: Secondary | ICD-10-CM

## 2023-02-06 DIAGNOSIS — J028 Acute pharyngitis due to other specified organisms: Secondary | ICD-10-CM | POA: Diagnosis not present

## 2023-02-06 DIAGNOSIS — G44221 Chronic tension-type headache, intractable: Secondary | ICD-10-CM | POA: Diagnosis not present

## 2023-02-06 DIAGNOSIS — B9789 Other viral agents as the cause of diseases classified elsewhere: Secondary | ICD-10-CM | POA: Diagnosis not present

## 2023-02-06 NOTE — Patient Instructions (Signed)
It was a pleasure meeting you today. Thank you for allowing me to take part in your health care.  Our goals for today as we discussed include:  Recommend office visit for evaluation and blood work  Symptomatic management for fever, muscle aches and headaches  -Tylenol 325-500 mg every 6 hours as needed -Ibuprofen 200 mg every 8 hours as needed -Cepacol lozenges as needed -Stay well hydrated   This is a list of the screening recommended for you and due dates:  Health Maintenance  Topic Date Due   Medicare Annual Wellness Visit  Never done   DTaP/Tdap/Td vaccine (1 - Tdap) Never done   Pap with HPV screening  Never done   Mammogram  09/30/2019   COVID-19 Vaccine (2 - 2023-24 season) 11/16/2022   Flu Shot  04/02/2023*   Colon Cancer Screening  09/27/2028   Hepatitis C Screening  Completed   HIV Screening  Completed   HPV Vaccine  Aged Out   Zoster (Shingles) Vaccine  Discontinued  *Topic was postponed. The date shown is not the original due date.     Follow up as needed  If you have any questions or concerns, please do not hesitate to call the office at [phone].  I look forward to our next visit and until then take care and stay safe.  Regards,   Dana Allan, MD   [facility]
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
## 2023-02-09 ENCOUNTER — Other Ambulatory Visit (INDEPENDENT_AMBULATORY_CARE_PROVIDER_SITE_OTHER): Payer: Medicare Other

## 2023-02-09 DIAGNOSIS — Z114 Encounter for screening for human immunodeficiency virus [HIV]: Secondary | ICD-10-CM

## 2023-02-09 DIAGNOSIS — I10 Essential (primary) hypertension: Secondary | ICD-10-CM

## 2023-02-10 LAB — LIPID PANEL
Cholesterol: 132 mg/dL (ref 0–200)
HDL: 57 mg/dL (ref >39.00)
LDL Cholesterol: 61 mg/dL (ref 0–99)
NonHDL: 74.86
Total CHOL/HDL Ratio: 2
Triglycerides: 68 mg/dL (ref 0.0–149.0)
VLDL: 13.6 mg/dL (ref 0.0–40.0)

## 2023-02-10 LAB — TSH: TSH: 2.47 u[IU]/mL (ref 0.35–5.50)

## 2023-02-10 LAB — HIV ANTIBODY (ROUTINE TESTING W REFLEX): HIV 1&2 Ab, 4th Generation: NONREACTIVE

## 2023-02-10 LAB — HEMOGLOBIN A1C: Hgb A1c MFr Bld: 5.2 % (ref 4.6–6.5)

## 2023-02-11 ENCOUNTER — Ambulatory Visit: Payer: Medicare Other | Admitting: Family Medicine

## 2023-02-13 ENCOUNTER — Encounter: Payer: Self-pay | Admitting: Family Medicine

## 2023-02-14 ENCOUNTER — Encounter: Payer: Self-pay | Admitting: Family Medicine

## 2023-02-14 DIAGNOSIS — B9789 Other viral agents as the cause of diseases classified elsewhere: Secondary | ICD-10-CM | POA: Insufficient documentation

## 2023-02-14 NOTE — Assessment & Plan Note (Signed)
Subjective memory complaints, with concern for potential cognitive decline. No formal cognitive assessment to date. -Order comprehensive lab panel including thyroid function tests, glucose, and A1c to rule out metabolic causes of cognitive decline. -Schedule in-person visit for cognitive screening after lab results are available.
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
## 2023-02-14 NOTE — Assessment & Plan Note (Signed)
Patient reports that will call to follow up if worsens.  Symptoms improving. No recent sick contacts, afebrile and hydrating well. Offered COVID, swabs.  Politely declined today. -If symptoms worsen; consider throat swab if necessary.

## 2023-02-16 ENCOUNTER — Ambulatory Visit: Admission: RE | Admit: 2023-02-16 | Payer: Medicare Other | Source: Ambulatory Visit

## 2023-02-17 ENCOUNTER — Inpatient Hospital Stay: Payer: Medicare Other | Admitting: Licensed Clinical Social Worker

## 2023-02-17 ENCOUNTER — Inpatient Hospital Stay: Payer: Medicare Other

## 2023-02-19 ENCOUNTER — Other Ambulatory Visit: Payer: Self-pay | Admitting: Psychiatry

## 2023-02-19 ENCOUNTER — Other Ambulatory Visit: Payer: Self-pay | Admitting: Cardiology

## 2023-02-19 DIAGNOSIS — G4701 Insomnia due to medical condition: Secondary | ICD-10-CM

## 2023-02-19 DIAGNOSIS — F4310 Post-traumatic stress disorder, unspecified: Secondary | ICD-10-CM

## 2023-02-19 DIAGNOSIS — E66813 Obesity, class 3: Secondary | ICD-10-CM

## 2023-02-19 DIAGNOSIS — I421 Obstructive hypertrophic cardiomyopathy: Secondary | ICD-10-CM

## 2023-02-19 DIAGNOSIS — I5032 Chronic diastolic (congestive) heart failure: Secondary | ICD-10-CM

## 2023-02-23 ENCOUNTER — Ambulatory Visit: Payer: Medicare Other | Admitting: Family Medicine

## 2023-02-26 ENCOUNTER — Ambulatory Visit: Payer: Medicare Other | Attending: Cardiology

## 2023-02-26 ENCOUNTER — Telehealth: Payer: Self-pay | Admitting: Psychiatry

## 2023-02-26 NOTE — Telephone Encounter (Signed)
Patient left voice message to cancel appointment in Sept. 2024 A message left to call and reschedule. Pharmacy contacted office for refill prompting another call to schedule appointment. A message was left to call and schedule.

## 2023-03-02 ENCOUNTER — Telehealth: Payer: Self-pay | Admitting: Cardiology

## 2023-03-02 NOTE — Telephone Encounter (Signed)
Patient states her 1st  monitor she only wore for 3 days due to a rash, and the 2nd and 3rd one that was sent to her "fell off". Please call to discuss what is the next steps.

## 2023-03-02 NOTE — Telephone Encounter (Signed)
Left message for patient to call back  

## 2023-03-03 ENCOUNTER — Telehealth: Payer: Self-pay | Admitting: Licensed Clinical Social Worker

## 2023-03-03 ENCOUNTER — Inpatient Hospital Stay: Payer: Medicare Other

## 2023-03-03 ENCOUNTER — Inpatient Hospital Stay: Payer: Medicare Other | Admitting: Licensed Clinical Social Worker

## 2023-03-03 NOTE — Telephone Encounter (Signed)
Pt was scheduled  for GEN COUNSEL today but called to r/s due to sinus infection/strep throat.   I let her know the only opening was tomorrow and then the provider was out in January and I did not know when she would be back.  Pt stated to cancel appts today and let Colin Mulders know and if there was anyone else pt could see while Colin Mulders was out to let her know.

## 2023-03-09 ENCOUNTER — Encounter: Payer: Self-pay | Admitting: Cardiovascular Disease

## 2023-03-13 ENCOUNTER — Encounter: Payer: Medicare Other | Admitting: Family Medicine

## 2023-03-13 ENCOUNTER — Telehealth: Payer: Self-pay | Admitting: Family Medicine

## 2023-03-13 NOTE — Telephone Encounter (Signed)
Left message to call office and reschedule appointment/provider out. Schedule the next available physical spot.

## 2023-03-16 ENCOUNTER — Telehealth: Payer: Self-pay | Admitting: Psychiatry

## 2023-03-16 NOTE — Telephone Encounter (Signed)
Please let me know if patient does not return call in a week to schedule appointment.

## 2023-03-16 NOTE — Telephone Encounter (Signed)
Patient cancelled her appointment for Sept. 2024. Messages have been left on 12-01-22, 02-28-23 and 03-16-23 to call and schedule a follow up if interested. She has not responded.  Pharmacy sent request for refill which has prompted another message for patient to call and schedule.

## 2023-03-20 ENCOUNTER — Telehealth: Payer: Self-pay

## 2023-03-20 ENCOUNTER — Other Ambulatory Visit: Payer: Medicare Other

## 2023-03-20 NOTE — Telephone Encounter (Signed)
 received message that pt needs a refill on the venlafaxine 75mg 

## 2023-03-20 NOTE — Telephone Encounter (Signed)
 left message that we had received phone message from [facility] requesting a refill on the venlafaxine. i looks as if she medication has been discontinued and also pt needs to call office to set up an appt. pt last seen on 10-14-22

## 2023-03-24 ENCOUNTER — Other Ambulatory Visit: Payer: Self-pay

## 2023-03-24 MED ORDER — ALBUTEROL SULFATE HFA 108 (90 BASE) MCG/ACT IN AERS: 1.0000 | INHALATION_SPRAY | Freq: Four times a day (QID) | RESPIRATORY_TRACT | 3 refills | Status: AC | PRN

## 2023-03-24 NOTE — Telephone Encounter (Signed)
 Received refill request from pharmacy

## 2023-03-26 ENCOUNTER — Other Ambulatory Visit: Payer: Self-pay | Admitting: Cardiovascular Disease

## 2023-03-27 ENCOUNTER — Other Ambulatory Visit: Payer: Self-pay | Admitting: Psychiatry

## 2023-03-27 DIAGNOSIS — G4701 Insomnia due to medical condition: Secondary | ICD-10-CM

## 2023-03-31 ENCOUNTER — Telehealth: Payer: Self-pay | Admitting: Psychiatry

## 2023-03-31 NOTE — Telephone Encounter (Signed)
 Patient has been noncompliant with follow-up appointments.  Will have staff check if she has returned our calls for scheduling an appointment.  Patient's last visit was in July and was advised to return in 6 weeks however has been noncompliant.  If she has not then we will dismiss patient from care.

## 2023-04-01 ENCOUNTER — Encounter: Payer: Self-pay | Admitting: Psychiatry

## 2023-04-01 ENCOUNTER — Telehealth: Payer: Self-pay | Admitting: Psychiatry

## 2023-04-01 NOTE — Telephone Encounter (Signed)
 I have printed out a letter for this patient with resources.  Letter to be mailed out.

## 2023-04-07 ENCOUNTER — Other Ambulatory Visit: Payer: Self-pay

## 2023-04-08 ENCOUNTER — Other Ambulatory Visit: Payer: Self-pay | Admitting: Family Medicine

## 2023-04-08 NOTE — Telephone Encounter (Signed)
Copied from CRM [id number]. Topic: Clinical - Medication Refill >> Apr 08, 2023 12:07 PM Steele Sizer wrote: Most Recent Primary Care Visit:  Provider: [facility]  Department: [facility]  Visit Type: LAB  Date: 02/09/2023  Medication: albuterol (PROAIR HFA) 108 (90 Base) MCG/ACT inhaler  Has the patient contacted their pharmacy? Yes (Agent: If no, request that the patient contact the pharmacy for the refill. If patient does not wish to contact the pharmacy document the reason why and proceed with request.) (Agent: If yes, when and what did the pharmacy advise?) Pharmacy is calling on behalf of PT  Is this the correct pharmacy for this prescription? Yes If no, delete pharmacy and type the correct one.  This is the patient's preferred pharmacy:  [facility] - [place], [place] - [address] [place] [address] Phone: [phone] Fax: [phone]   Has the prescription been filled recently? No  Is the patient out of the medication? No  Has the patient been seen for an appointment in the last year OR does the patient have an upcoming appointment?   Can we respond through MyChart?   Agent: Please be advised that Rx refills may take up to 3 business days. We ask that you follow-up with your pharmacy.

## 2023-04-17 ENCOUNTER — Encounter: Payer: Self-pay | Admitting: Family Medicine

## 2023-04-30 ENCOUNTER — Other Ambulatory Visit: Payer: Self-pay | Admitting: Psychiatry

## 2023-04-30 DIAGNOSIS — G4701 Insomnia due to medical condition: Secondary | ICD-10-CM

## 2023-05-20 ENCOUNTER — Other Ambulatory Visit: Payer: Self-pay | Admitting: Psychiatry

## 2023-05-20 DIAGNOSIS — G4701 Insomnia due to medical condition: Secondary | ICD-10-CM

## 2023-05-27 ENCOUNTER — Ambulatory Visit: Payer: Medicare Other | Admitting: Family Medicine

## 2023-05-29 ENCOUNTER — Ambulatory Visit: Payer: Medicare Other | Admitting: Family Medicine

## 2023-06-08 ENCOUNTER — Ambulatory Visit
Admission: RE | Admit: 2023-06-08 | Discharge: 2023-06-08 | Disposition: A | Discharge status: Home / Self Care | Source: Ambulatory Visit | Attending: Family Medicine | Admitting: Family Medicine

## 2023-06-08 DIAGNOSIS — I10 Essential (primary) hypertension: Secondary | ICD-10-CM | POA: Diagnosis not present

## 2023-06-08 DIAGNOSIS — Z1231 Encounter for screening mammogram for malignant neoplasm of breast: Secondary | ICD-10-CM | POA: Diagnosis present

## 2023-06-09 ENCOUNTER — Encounter: Payer: Self-pay | Admitting: Family Medicine

## 2023-06-09 ENCOUNTER — Ambulatory Visit: Admitting: Family Medicine

## 2023-06-09 ENCOUNTER — Telehealth: Payer: Self-pay

## 2023-06-09 ENCOUNTER — Other Ambulatory Visit: Payer: Self-pay | Admitting: Family Medicine

## 2023-06-09 VITALS — BP 118/70 | HR 66 | Temp 98.2°F | Resp 20 | Ht 65.0 in | Wt 207.1 lb

## 2023-06-09 DIAGNOSIS — R5382 Chronic fatigue, unspecified: Secondary | ICD-10-CM | POA: Diagnosis not present

## 2023-06-09 DIAGNOSIS — E559 Vitamin D deficiency, unspecified: Secondary | ICD-10-CM | POA: Diagnosis not present

## 2023-06-09 DIAGNOSIS — R7309 Other abnormal glucose: Secondary | ICD-10-CM

## 2023-06-09 DIAGNOSIS — I2510 Atherosclerotic heart disease of native coronary artery without angina pectoris: Secondary | ICD-10-CM

## 2023-06-09 DIAGNOSIS — Z9884 Bariatric surgery status: Secondary | ICD-10-CM | POA: Diagnosis not present

## 2023-06-09 DIAGNOSIS — I10 Essential (primary) hypertension: Secondary | ICD-10-CM | POA: Diagnosis not present

## 2023-06-09 DIAGNOSIS — R399 Unspecified symptoms and signs involving the genitourinary system: Secondary | ICD-10-CM

## 2023-06-09 DIAGNOSIS — E538 Deficiency of other specified B group vitamins: Secondary | ICD-10-CM

## 2023-06-09 DIAGNOSIS — E782 Mixed hyperlipidemia: Secondary | ICD-10-CM

## 2023-06-09 DIAGNOSIS — Z1231 Encounter for screening mammogram for malignant neoplasm of breast: Secondary | ICD-10-CM

## 2023-06-09 LAB — POCT URINALYSIS DIPSTICK
Glucose, UA: NEGATIVE
Ketones, UA: NEGATIVE
Nitrite, UA: NEGATIVE
Protein, UA: POSITIVE — AB
Spec Grav, UA: 1.02 (ref 1.010–1.025)
Urobilinogen, UA: 4 U/dL — AB
pH, UA: 5.5 (ref 5.0–8.0)

## 2023-06-09 MED ORDER — SEMAGLUTIDE-WEIGHT MANAGEMENT 0.25 MG/0.5ML ~~LOC~~ SOAJ: 0.2500 mg | SUBCUTANEOUS | 0 refills | Status: AC

## 2023-06-09 NOTE — Telephone Encounter (Signed)
Pt needs PA for [employer]gov[employer].

## 2023-06-09 NOTE — Progress Notes (Signed)
 SUBJECTIVE:   Chief Complaint  Patient presents with   Flank Pain    Right side X 1-2 months   HPI Presents for acute visit  Discussed the use of AI scribe software for clinical note transcription with the patient, who gave verbal consent to proceed.  History of Present Illness Cheyenne Gray is a 58 year old female who presents with symptoms suggestive of a kidney infection.  For the past one to two months, she has experienced symptoms suggestive of a kidney infection. The pain, located in the lower back, varies in intensity, with some days being worse than others. She denies dysuria but reports urinary urgency with minimal output, stating 'I feel like I have to go pee but nothing comes out.' No groin pain is present. She has not taken Lasix in months, which was previously prescribed as needed.  She is currently on methadone and carvedilol, but has not been taking mirtazapine due to a recent cold. She uses nitroglycerin as needed. She has a history of heart issues and is considering weight loss medications like Wegovy or Ozempic. She is frustrated with insurance coverage for these medications.  She mentions a family history of pancreatic cancer, which causes her concern. She experiences frequent nausea and headaches, which have been ongoing issues. No recent nausea or vomiting.    PERTINENT PMH / PSH: As above  OBJECTIVE:  BP 118/70   Pulse 66   Temp 98.2 F (36.8 C)   Resp 20   Ht 5\' 5"  (1.651 m)   Wt 207 lb 2 oz (94 kg)   LMP 02/12/2016 (Approximate)   SpO2 98%   BMI 34.47 kg/m    Physical Exam Vitals reviewed.  Constitutional:      General: She is not in acute distress.    Appearance: She is obese. She is not ill-appearing.  HENT:     Head: Normocephalic.     Right Ear: Tympanic membrane, ear canal and external ear normal.     Left Ear: Tympanic membrane, ear canal and external ear normal.     Nose: Nose normal.     Mouth/Throat:     Mouth: Mucous membranes  are moist.  Eyes:     Extraocular Movements: Extraocular movements intact.     Conjunctiva/sclera: Conjunctivae normal.     Pupils: Pupils are equal, round, and reactive to light.  Neck:     Thyroid: No thyromegaly or thyroid tenderness.     Vascular: No carotid bruit.  Cardiovascular:     Rate and Rhythm: Normal rate and regular rhythm.     Pulses: Normal pulses.     Heart sounds: Normal heart sounds.  Pulmonary:     Effort: Pulmonary effort is normal.     Breath sounds: Normal breath sounds.  Abdominal:     General: Bowel sounds are normal. There is no distension.     Palpations: Abdomen is soft.     Tenderness: There is no abdominal tenderness. There is no right CVA tenderness, left CVA tenderness, guarding or rebound.  Musculoskeletal:        General: Normal range of motion.     Cervical back: Normal range of motion.     Right lower leg: No edema.     Left lower leg: No edema.  Lymphadenopathy:     Cervical: No cervical adenopathy.  Skin:    Capillary Refill: Capillary refill takes less than 2 seconds.  Neurological:     General: No focal deficit present.  Mental Status: She is alert and oriented to person, place, and time. Mental status is at baseline.     Motor: No weakness.  Psychiatric:        Mood and Affect: Mood normal.        Behavior: Behavior normal.        Thought Content: Thought content normal.        Judgment: Judgment normal.           06/09/2023    1:49 PM 02/06/2023   11:01 AM 10/14/2022    3:47 PM 06/30/2022    3:07 PM 03/21/2022   11:31 AM  Depression screen PHQ 2/9  Decreased Interest 3 1  1    Down, Depressed, Hopeless 2 0  0   PHQ - 2 Score 5 1  1    Altered sleeping 3 3     Tired, decreased energy 3 3     Change in appetite 3 3     Feeling bad or failure about yourself  0 1     Trouble concentrating 3 0     Moving slowly or fidgety/restless 0 0     Suicidal thoughts 0 0     PHQ-9 Score 17 11     Difficult doing work/chores Extremely  dIfficult Somewhat difficult        Information is confidential and restricted. Go to Review Flowsheets to unlock data.      06/09/2023    1:49 PM 10/14/2022    3:49 PM 10/13/2022    4:20 PM 06/30/2022    3:07 PM  GAD 7 : Generalized Anxiety Score  Nervous, Anxious, on Edge 2  3 3   Control/stop worrying 3  3 3   Worry too much - different things 3  3 3   Trouble relaxing 3  3 2   Restless 2  2 2   Easily annoyed or irritable 3  3 2   Afraid - awful might happen 0  0 2  Total GAD 7 Score 16  17 17   Anxiety Difficulty Extremely difficult  Extremely difficult Very difficult     Information is confidential and restricted. Go to Review Flowsheets to unlock data.    ASSESSMENT/PLAN:  Urinary tract infection symptoms Assessment & Plan: Intermittent lower back pain and urinary symptoms with leukocyturia suggest possible UTI or nephrolithiasis. Cautious about antibiotics without confirmed infection. - Send urine culture for analysis. - Advise increased fluid intake to 64 ounces daily. - Recommend cranberry juice. - Consider antibiotics if urine culture confirms infection.  Orders: -     POCT urinalysis dipstick -     Urine Culture  Coronary artery disease involving native coronary artery of native heart without angina pectoris -     Semaglutide-Weight Management; Inject 0.25 mg into the skin once a week for 28 days.  Dispense: 2 mL; Refill: 0  H/O gastric bypass -     Semaglutide-Weight Management; Inject 0.25 mg into the skin once a week for 28 days.  Dispense: 2 mL; Refill: 0  Chronic fatigue -     TSH  B12 deficiency Assessment & Plan: Receives B12 injections biannually. Discussed increasing frequency, but she prefers current regimen if effective. -Check level before administering B12 injection. - Has been following with Hematology  Orders: -     CBC with Differential/Platelet -     Vitamin B12  Mixed hyperlipidemia -     Semaglutide-Weight Management; Inject 0.25 mg into  the skin once a week for 28 days.  Dispense: 2 mL;  Refill: 0  Vitamin D deficiency -     VITAMIN D 25 Hydroxy (Vit-D Deficiency, Fractures)  Essential hypertension -     Comprehensive metabolic panel with GFR  Abnormal glucose -     Hemoglobin A1c  Morbid obesity (HCC) Assessment & Plan: Advised weight loss due to cardiac issues. Discussed Wegovy or Ozempic pending insurance coverage. - Start Wegovy 0.25 mg weekly - Follow up in 4 weeks    PDMP reviewed  Return in about 4 weeks (around 07/07/2023) for PCP.  Dana Allan, MD

## 2023-06-09 NOTE — Patient Instructions (Addendum)
 It was a pleasure meeting you today. Thank you for allowing me to take part in your health care.  Our goals for today as we discussed include:  Urine did not show infection.  Small amount of whit blood cells but this is not a true infection. Sending for further evaluation.   We will get some labs today.  If they are abnormal or we need to do something about them, I will call you.  If they are normal, I will send you a message on MyChart (if it is active) or a letter in the mail.  If you don't hear from [place] in 2 weeks, please call the office at the number below.    Start Wegovy 0.25 mg weekly Follow up in 4 weeks   [name] , Thank you for taking time to come for your Medicare Wellness Visit. I appreciate your ongoing commitment to your health goals. Please review the following plan we discussed and let me know if I can assist you in the future.    This is a list of the screening recommended for you and due dates:  Health Maintenance  Topic Date Due   Medicare Annual Wellness Visit  Never done   Pneumococcal Vaccination (1 of 2 - PCV) Never done   DTaP/Tdap/Td vaccine (1 - Tdap) Never done   Pap with HPV screening  Never done   Mammogram  09/30/2019   COVID-19 Vaccine (2 - 2024-25 season) 11/16/2022   Flu Shot  06/15/2023*   Colon Cancer Screening  09/27/2028   Hepatitis C Screening  Completed   HIV Screening  Completed   HPV Vaccine  Aged Out   Zoster (Shingles) Vaccine  Discontinued  *Topic was postponed. The date shown is not the original due date.    If you have any questions or concerns, please do not hesitate to call the office at [phone].  I look forward to our next visit and until then take care and stay safe.  Regards,   Dana Allan, MD   [facility]

## 2023-06-10 ENCOUNTER — Other Ambulatory Visit (HOSPITAL_COMMUNITY): Payer: Self-pay

## 2023-06-10 ENCOUNTER — Encounter: Payer: Self-pay | Admitting: Oncology

## 2023-06-10 ENCOUNTER — Telehealth: Payer: Self-pay | Admitting: Pharmacy Technician

## 2023-06-10 LAB — CBC WITH DIFFERENTIAL/PLATELET
Basophils Absolute: 0.1 10*3/uL (ref 0.0–0.1)
Basophils Relative: 1.2 % (ref 0.0–3.0)
Eosinophils Absolute: 0.2 10*3/uL (ref 0.0–0.7)
Eosinophils Relative: 2.3 % (ref 0.0–5.0)
HCT: 38.8 % (ref 36.0–46.0)
Hemoglobin: 13.2 g/dL (ref 12.0–15.0)
Lymphocytes Relative: 34.3 % (ref 12.0–46.0)
Lymphs Abs: 2.2 10*3/uL (ref 0.7–4.0)
MCHC: 33.9 g/dL (ref 30.0–36.0)
MCV: 91.1 fl (ref 78.0–100.0)
Monocytes Absolute: 0.5 10*3/uL (ref 0.1–1.0)
Monocytes Relative: 8.2 % (ref 3.0–12.0)
Neutro Abs: 3.5 10*3/uL (ref 1.4–7.7)
Neutrophils Relative %: 54 % (ref 43.0–77.0)
Platelets: 197 10*3/uL (ref 150.0–400.0)
RBC: 4.26 Mil/uL (ref 3.87–5.11)
RDW: 14.9 % (ref 11.5–15.5)
WBC: 6.5 10*3/uL (ref 4.0–10.5)

## 2023-06-10 LAB — COMPREHENSIVE METABOLIC PANEL WITH GFR
ALT: 25 U/L (ref 0–35)
AST: 35 U/L (ref 0–37)
Albumin: 4 g/dL (ref 3.5–5.2)
Alkaline Phosphatase: 118 U/L — ABNORMAL HIGH (ref 39–117)
BUN: 12 mg/dL (ref 6–23)
CO2: 32 meq/L (ref 19–32)
Calcium: 9.4 mg/dL (ref 8.4–10.5)
Chloride: 102 meq/L (ref 96–112)
Creatinine, Ser: 1.06 mg/dL (ref 0.40–1.20)
GFR: 59.02 mL/min — ABNORMAL LOW (ref >60.00)
Glucose, Bld: 79 mg/dL (ref 70–99)
Potassium: 4 meq/L (ref 3.5–5.1)
Sodium: 142 meq/L (ref 135–145)
Total Bilirubin: 0.5 mg/dL (ref 0.2–1.2)
Total Protein: 7.3 g/dL (ref 6.0–8.3)

## 2023-06-10 LAB — URINE CULTURE
MICRO NUMBER:: 16244768
SPECIMEN QUALITY:: ADEQUATE

## 2023-06-10 LAB — HEMOGLOBIN A1C: Hgb A1c MFr Bld: 5.2 % (ref 4.6–6.5)

## 2023-06-10 NOTE — Telephone Encounter (Signed)
 Good morning sent this morning, see telephone encounter

## 2023-06-10 NOTE — Telephone Encounter (Signed)
 PA has been denied in separate encounter, please sign off on rx in this encounter as PA team is unable to resolve RX requests. Thank you

## 2023-06-10 NOTE — Telephone Encounter (Signed)
 Pharmacy Patient Advocate Encounter   Received notification from Pt Calls Messages that prior authorization for Wegovy 0.25MG /0.5ML auto-injectors is required/requested.   Insurance verification completed.   The patient is insured through [facility] .   Per test claim: [id number] Submitted and pending

## 2023-06-10 NOTE — Telephone Encounter (Signed)
 Pharmacy Patient Advocate Encounter  Received notification from [facility] that Prior Authorization for Cheyenne Gray has been DENIED.  Full denial letter will be uploaded to the media tab. See denial reason below.   PA #/Case ID/Reference #: [id number]

## 2023-06-10 NOTE — Telephone Encounter (Signed)
 Noted.

## 2023-06-10 NOTE — Telephone Encounter (Signed)
 PA request has been Denied. New Encounter has been or will be created for follow up. For additional info see Pharmacy Prior Auth telephone encounter from 06/10/2023.

## 2023-06-11 LAB — VITAMIN D 25 HYDROXY (VIT D DEFICIENCY, FRACTURES): VITD: 48.23 ng/mL (ref 30.00–100.00)

## 2023-06-11 LAB — VITAMIN B12: Vitamin B-12: 702 pg/mL (ref 211–911)

## 2023-06-11 LAB — TSH: TSH: 1.94 u[IU]/mL (ref 0.35–5.50)

## 2023-06-14 ENCOUNTER — Encounter: Payer: Self-pay | Admitting: Family Medicine

## 2023-06-14 DIAGNOSIS — R399 Unspecified symptoms and signs involving the genitourinary system: Secondary | ICD-10-CM | POA: Insufficient documentation

## 2023-06-14 DIAGNOSIS — R7309 Other abnormal glucose: Secondary | ICD-10-CM | POA: Insufficient documentation

## 2023-06-14 NOTE — Assessment & Plan Note (Signed)
 Advised weight loss due to cardiac issues. Discussed Wegovy or Ozempic pending insurance coverage. - Start Wegovy 0.25 mg weekly - Follow up in 4 weeks

## 2023-06-14 NOTE — Assessment & Plan Note (Addendum)
 Receives B12 injections biannually. Discussed increasing frequency, but she prefers current regimen if effective. -Check level before administering B12 injection. - Has been following with Hematology

## 2023-06-14 NOTE — Assessment & Plan Note (Signed)
 Intermittent lower back pain and urinary symptoms with leukocyturia suggest possible UTI or nephrolithiasis. Cautious about antibiotics without confirmed infection. - Send urine culture for analysis. - Advise increased fluid intake to 64 ounces daily. - Recommend cranberry juice. - Consider antibiotics if urine culture confirms infection.

## 2023-06-26 ENCOUNTER — Other Ambulatory Visit: Payer: Self-pay | Admitting: Cardiovascular Disease

## 2023-06-26 ENCOUNTER — Other Ambulatory Visit (HOSPITAL_COMMUNITY): Payer: Self-pay

## 2023-06-26 DIAGNOSIS — I2510 Atherosclerotic heart disease of native coronary artery without angina pectoris: Secondary | ICD-10-CM

## 2023-06-26 DIAGNOSIS — R55 Syncope and collapse: Secondary | ICD-10-CM

## 2023-07-07 ENCOUNTER — Ambulatory Visit (INDEPENDENT_AMBULATORY_CARE_PROVIDER_SITE_OTHER): Admitting: Family Medicine

## 2023-07-07 ENCOUNTER — Encounter: Payer: Self-pay | Admitting: Family Medicine

## 2023-07-07 VITALS — BP 110/70 | HR 67 | Temp 98.2°F | Resp 20 | Ht 65.0 in | Wt 206.5 lb

## 2023-07-07 DIAGNOSIS — R350 Frequency of micturition: Secondary | ICD-10-CM

## 2023-07-07 DIAGNOSIS — I5032 Chronic diastolic (congestive) heart failure: Secondary | ICD-10-CM

## 2023-07-07 DIAGNOSIS — R1032 Left lower quadrant pain: Secondary | ICD-10-CM | POA: Diagnosis not present

## 2023-07-07 DIAGNOSIS — E669 Obesity, unspecified: Secondary | ICD-10-CM

## 2023-07-07 NOTE — Progress Notes (Signed)
 SUBJECTIVE:   Chief Complaint  Patient presents with   Medical Management of Chronic Issues    4 week follow up   HPI Presents for follow up weight management  Discussed the use of AI scribe software for clinical note transcription with the patient, who gave verbal consent to proceed.  History of Present Illness Cheyenne Gray is a 58 year old female with congestive heart failure who presents with concerns about medication management and kidney function.  She is here for a follow-up regarding her medication management, specifically concerning Wegovy . She started taking Wegovy  four weeks ago after obtaining it from a friend, as her prescription was not yet approved through her insurance. She is awaiting the completion of necessary paperwork to officially start the medication through her pharmacy.  She has a history of heart issues and was previously hospitalized in December for severe chest pain, which was initially suspected to be a heart attack. During that visit, her PR BNP was extremely high, indicating congestive heart issues. She was advised to reduce her Imdur  dosage to half a tablet daily, which she takes occasionally. She does not take Lasix  regularly, only when experiencing bloating.  She has ongoing concerns about her kidney function. In December, her creatinine levels were noted to be elevated, but they have since normalized. She experiences frequent urination issues, including difficulty initiating urination and dribbling, but no pain or fever associated with urination. She has not seen a urologist before and is considering further evaluation.  She reports back pain that started approximately six weeks ago, primarily on the right side, with occasional left side involvement. The pain is consistent. No fever or significant pain during urination. Her family history includes diabetes, but she reports no personal history of diabetes and her kidney function tests have been normal.  She is concerned about the possibility of kidney stones due to her urinary symptoms.    PERTINENT PMH / PSH: As above  OBJECTIVE:  BP 110/70   Pulse 67   Temp 98.2 F (36.8 C)   Resp 20   Ht 5\' 5"  (1.651 m)   Wt 206 lb 8 oz (93.7 kg)   LMP 02/12/2016 (Approximate)   SpO2 96%   BMI 34.36 kg/m    Physical Exam Vitals reviewed.  Constitutional:      General: She is not in acute distress.    Appearance: She is not ill-appearing.  HENT:     Head: Normocephalic.  Eyes:     Conjunctiva/sclera: Conjunctivae normal.  Cardiovascular:     Rate and Rhythm: Normal rate.  Pulmonary:     Effort: Pulmonary effort is normal.  Musculoskeletal:        General: Normal range of motion.     Cervical back: Normal range of motion.  Neurological:     Mental Status: She is alert and oriented to person, place, and time. Mental status is at baseline.  Psychiatric:        Mood and Affect: Mood normal.        Behavior: Behavior normal.        Thought Content: Thought content normal.        Judgment: Judgment normal.           06/09/2023    1:49 PM 02/06/2023   11:01 AM 10/14/2022    3:47 PM 06/30/2022    3:07 PM 03/21/2022   11:31 AM  Depression screen PHQ 2/9  Decreased Interest 3 1  1    Down, Depressed, Hopeless  2 0  0   PHQ - 2 Score 5 1  1    Altered sleeping 3 3     Tired, decreased energy 3 3     Change in appetite 3 3     Feeling bad or failure about yourself  0 1     Trouble concentrating 3 0     Moving slowly or fidgety/restless 0 0     Suicidal thoughts 0 0     PHQ-9 Score 17 11     Difficult doing work/chores Extremely dIfficult Somewhat difficult        Information is confidential and restricted. Go to Review Flowsheets to unlock data.      06/09/2023    1:49 PM 10/14/2022    3:49 PM 10/13/2022    4:20 PM 06/30/2022    3:07 PM  GAD 7 : Generalized Anxiety Score  Nervous, Anxious, on Edge 2  3 3   Control/stop worrying 3  3 3   Worry too much - different things 3  3 3    Trouble relaxing 3  3 2   Restless 2  2 2   Easily annoyed or irritable 3  3 2   Afraid - awful might happen 0  0 2  Total GAD 7 Score 16  17 17   Anxiety Difficulty Extremely difficult  Extremely difficult Very difficult     Information is confidential and restricted. Go to Review Flowsheets to unlock data.    ASSESSMENT/PLAN:  Urinary frequency Assessment & Plan: Intermittent urinary symptoms without dysuria or infection signs. She is concerned due intermittent right-sided back pain with occasional left involvement, persisting for weeks without clear cause. Differential includes nephrolithiasis or UTI. Normal renal function. Has history of chronic back pain but reports different pain. - Collect urine sample for analysis to rule out infection. - Order CT renal study to evaluate for nephrolithiasis. - Consider tamsulosin if symptoms worsen and no infection is present. - Referral sent to urology for evaluation at patients request  Orders: -     Urine Culture -     CT RENAL STONE STUDY -     Ambulatory referral to Urology  Left lower quadrant abdominal pain Assessment & Plan: Intermittent left lower quadrant pain without fever or acute abdomen. No change in bowel habits. Patient this likely related to kidneys. Consider GI referral if no improvement   Orders: -     CT RENAL STONE STUDY  Chronic heart failure with preserved ejection fraction (HCC) Assessment & Plan: CHF with previously elevated BNP. Managed with reduced Imdur . No recent edema or diuretic need. Normal renal function. - Continue current management without diuretics unless edema occurs.   Obesity (BMI 30-39.9) Assessment & Plan: Awaiting Wegovy  approval. Using Wegovy  from a friend due to prior authorization delays. - Ensure prior authorization paperwork for Wegovy  is faxed to the pharmacy.       PDMP reviewed  Return if symptoms worsen or fail to improve, for PCP.  Valli Gaw, MD

## 2023-07-07 NOTE — Patient Instructions (Addendum)
 It was a pleasure meeting you today. Thank you for allowing me to take part in your health care.  Our goals for today as we discussed include:  Recheck urine today for any infectious process  Have ordered imaging to rule out kidney stones  Strain urine  Will send referral for urology   This is a list of the screening recommended for you and due dates:  Health Maintenance  Topic Date Due   Medicare Annual Wellness Visit  Never done   DTaP/Tdap/Td vaccine (1 - Tdap) Never done   Pneumococcal Vaccination (1 of 2 - PCV) Never done   Pap with HPV screening  Never done   COVID-19 Vaccine (2 - 2024-25 season) 11/16/2022   Flu Shot  10/16/2023   Mammogram  06/07/2024   Colon Cancer Screening  09/27/2028   Hepatitis C Screening  Completed   HIV Screening  Completed   HPV Vaccine  Aged Out   Meningitis B Vaccine  Aged Out   Zoster (Shingles) Vaccine  Discontinued    If you have any questions or concerns, please do not hesitate to call the office at [phone].  I look forward to our next visit and until then take care and stay safe.  Regards,   Valli Gaw, MD   [facility]

## 2023-07-08 LAB — URINE CULTURE
MICRO NUMBER:: 16359487
Result:: NO GROWTH
SPECIMEN QUALITY:: ADEQUATE

## 2023-07-12 ENCOUNTER — Encounter: Payer: Self-pay | Admitting: Family Medicine

## 2023-07-12 DIAGNOSIS — R350 Frequency of micturition: Secondary | ICD-10-CM | POA: Insufficient documentation

## 2023-07-12 DIAGNOSIS — R1032 Left lower quadrant pain: Secondary | ICD-10-CM | POA: Insufficient documentation

## 2023-07-12 NOTE — Assessment & Plan Note (Signed)
 Awaiting Wegovy  approval. Using Wegovy  from a friend due to prior authorization delays. - Ensure prior authorization paperwork for Wegovy  is faxed to the pharmacy.

## 2023-07-12 NOTE — Assessment & Plan Note (Addendum)
 Intermittent left lower quadrant pain without fever or acute abdomen. No change in bowel habits. Patient this likely related to kidneys. Consider GI referral if no improvement

## 2023-07-12 NOTE — Assessment & Plan Note (Signed)
 CHF with previously elevated BNP. Managed with reduced Imdur . No recent edema or diuretic need. Normal renal function. - Continue current management without diuretics unless edema occurs.

## 2023-07-12 NOTE — Assessment & Plan Note (Addendum)
 Intermittent urinary symptoms without dysuria or infection signs. She is concerned due intermittent right-sided back pain with occasional left involvement, persisting for weeks without clear cause. Differential includes nephrolithiasis or UTI. Normal renal function. Has history of chronic back pain but reports different pain. - Collect urine sample for analysis to rule out infection. - Order CT renal study to evaluate for nephrolithiasis. - Consider tamsulosin if symptoms worsen and no infection is present. - Referral sent to urology for evaluation at patients request

## 2023-07-14 ENCOUNTER — Telehealth: Payer: Self-pay | Admitting: Family Medicine

## 2023-07-14 NOTE — Telephone Encounter (Signed)
 Lft pt vm to call ofc to sch CT. thanks

## 2023-07-16 ENCOUNTER — Telehealth: Payer: Self-pay | Admitting: Family Medicine

## 2023-07-16 NOTE — Telephone Encounter (Signed)
 Lft pt vm to call ofc to sch CT. thanks

## 2023-07-17 ENCOUNTER — Telehealth: Payer: Self-pay | Admitting: Family Medicine

## 2023-07-17 NOTE — Telephone Encounter (Signed)
 Lft pt vm to call ofc to sch CT. thanks

## 2023-07-27 ENCOUNTER — Inpatient Hospital Stay

## 2023-07-27 ENCOUNTER — Inpatient Hospital Stay: Payer: Medicare Other | Attending: Oncology

## 2023-07-27 ENCOUNTER — Encounter: Payer: Self-pay | Admitting: Oncology

## 2023-07-27 ENCOUNTER — Inpatient Hospital Stay (HOSPITAL_BASED_OUTPATIENT_CLINIC_OR_DEPARTMENT_OTHER): Payer: Medicare Other | Admitting: Oncology

## 2023-07-27 VITALS — BP 114/64 | HR 59 | Temp 96.0°F | Resp 18 | Wt 201.7 lb

## 2023-07-27 DIAGNOSIS — D508 Other iron deficiency anemias: Secondary | ICD-10-CM

## 2023-07-27 DIAGNOSIS — Z9884 Bariatric surgery status: Secondary | ICD-10-CM | POA: Insufficient documentation

## 2023-07-27 DIAGNOSIS — Z809 Family history of malignant neoplasm, unspecified: Secondary | ICD-10-CM

## 2023-07-27 DIAGNOSIS — D509 Iron deficiency anemia, unspecified: Secondary | ICD-10-CM | POA: Insufficient documentation

## 2023-07-27 LAB — IRON AND TIBC
Iron: 74 ug/dL (ref 28–170)
Saturation Ratios: 22 % (ref 10.4–31.8)
TIBC: 330 ug/dL (ref 250–450)
UIBC: 256 ug/dL

## 2023-07-27 LAB — CBC WITH DIFFERENTIAL (CANCER CENTER ONLY)
Abs Immature Granulocytes: 0.02 10*3/uL (ref 0.00–0.07)
Basophils Absolute: 0.1 10*3/uL (ref 0.0–0.1)
Basophils Relative: 1 %
Eosinophils Absolute: 0.1 10*3/uL (ref 0.0–0.5)
Eosinophils Relative: 2 %
HCT: 39 % (ref 36.0–46.0)
Hemoglobin: 13.1 g/dL (ref 12.0–15.0)
Immature Granulocytes: 0 %
Lymphocytes Relative: 38 %
Lymphs Abs: 2.7 10*3/uL (ref 0.7–4.0)
MCH: 31 pg (ref 26.0–34.0)
MCHC: 33.6 g/dL (ref 30.0–36.0)
MCV: 92.2 fL (ref 80.0–100.0)
Monocytes Absolute: 0.6 10*3/uL (ref 0.1–1.0)
Monocytes Relative: 9 %
Neutro Abs: 3.6 10*3/uL (ref 1.7–7.7)
Neutrophils Relative %: 50 %
Platelet Count: 187 10*3/uL (ref 150–400)
RBC: 4.23 MIL/uL (ref 3.87–5.11)
RDW: 12.8 % (ref 11.5–15.5)
WBC Count: 7.2 10*3/uL (ref 4.0–10.5)
nRBC: 0 % (ref 0.0–0.2)

## 2023-07-27 LAB — VITAMIN B12: Vitamin B-12: 948 pg/mL — ABNORMAL HIGH (ref 180–914)

## 2023-07-27 LAB — RETIC PANEL
Immature Retic Fract: 3.6 % (ref 2.3–15.9)
RBC.: 4.24 MIL/uL (ref 3.87–5.11)
Retic Count, Absolute: 49.2 10*3/uL (ref 19.0–186.0)
Retic Ct Pct: 1.2 % (ref 0.4–3.1)
Reticulocyte Hemoglobin: 34.9 pg (ref >27.9)

## 2023-07-27 LAB — FERRITIN: Ferritin: 114 ng/mL (ref 11–307)

## 2023-07-27 MED ORDER — CYANOCOBALAMIN 1000 MCG/ML IJ SOLN
1000.0000 ug | Freq: Once | INTRAMUSCULAR | Status: AC
  Administered 2023-07-27: 1000 ug via INTRAMUSCULAR
  Filled 2023-07-27: qty 1

## 2023-07-27 NOTE — Assessment & Plan Note (Signed)
Refer to genetic counselor.  

## 2023-07-27 NOTE — Progress Notes (Signed)
 Hematology/Oncology Progress note [phone] [phone]        REASON FOR VISIT Follow-up for iron  deficiency anemia, history of gastric bypass  ASSESSMENT & PLAN:   IDA (iron  deficiency anemia) #History of iron  deficiency anemia.  In the context of gastric bypass. Lab Results  Component Value Date   HGB 13.1 07/27/2023   TIBC 330 07/27/2023   IRONPCTSAT 22 07/27/2023   FERRITIN 114 07/27/2023    I will hold off Venofer   Family history of cancer Refer to genetic counselor.   H/O gastric bypass B12 level is pending She gets B12 injections every 6 months  Recommend patient to try sublingual B12 2500mcg daily.   Orders Placed This Encounter  Procedures   CBC with Differential (Cancer Center Only)    Standing Status:   Future    Expected Date:   01/27/2024    Expiration Date:   07/26/2024   Iron  and TIBC    Standing Status:   Future    Expected Date:   01/27/2024    Expiration Date:   07/26/2024   Ferritin    Standing Status:   Future    Expected Date:   01/27/2024    Expiration Date:   07/26/2024   Vitamin B12    Standing Status:   Future    Expected Date:   01/27/2024    Expiration Date:   07/26/2024   Retic Panel    Standing Status:   Future    Expected Date:   01/27/2024    Expiration Date:   07/26/2024   Ambulatory referral to Genetics    Referral Priority:   Routine    Referral Type:   Consultation    Referral Reason:   Specialty Services Required    Number of Visits Requested:   1   Follow-up in 6 months. All questions were answered. The patient knows to call the clinic with any problems, questions or concerns.  Timmy Forbes, MD, PhD [facility] Hematology Oncology 07/27/2023    HISTORY OF PRESENTING ILLNESS:  This is a patient who used to follow up with Dr. Thurston Flow presents for follow-up of management of her anemia.   History of gastric bypass in 2001 Patient was last seen by Dr. Thurston Flow in July 2016 for iron  deficiency.  She was  again being seen by nurse practitioner on August 21, 2015 for follow-up of her iron  deficiency anemia.  She has a history of iron  deficiency anemia remote gastric bypass history in 2001.  She used to IV iron  infusion with venofer  100mg  Every 3 months but she has a follow-up with us  for about a year.  Patient reports feeling fatigued.  She did not have much of appetite.  Denies any nausea vomiting diarrhea or abdominal pain.  She has no unintentional weight loss, instead she feels that she has gained a few pounds.  # . She follows up with cardiology for CAD, long QT syndrome, chest pain, lower extremity edema.  INTERVAL HISTORY Cheyenne Gray is a 58 y.o. female who has above history reviewed by me today presents for follow up visit for management of lung nodule, history of iron  deficiency anemia due to gastric bypass.  .  Patient was last seen today by me.  Virtual visit in February 2021. Recently she went to emergency room at [facility] for evaluation of chest pain.  Patient had CT PE as well as abdomen scan done in the emergency room. 01/31/2020, CT chest PE protocol showed no PE.  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  Tiny punctated bilateral pulmonary nodules-3 mm nodule near the left lung base, punctate nodule in the right lower lobe.  Mild bronchial wall thickening.  Patulous fluid-filled esophagus. CT abdomen pelvis with contrast showed no etiology of abdominal pain was identified.  Mild intra and extra hepatic bile duct dilatation which can be seen status post cholecystectomy.  Patient follows up with cardiology for Hypertrophic cardiomyopathy, MYBPC 3 gene. Patient reports extensive family history of cancer.  She is concerned about lung nodules that was detected on her CT scan on 01/31/2020-3 mm nodule left lung base, punctate nodule right lower lobe. Repeat CT chest was ordered and she did not get it done.    INTERVAL HISTORY Cheyenne Gray is a 58 y.o.  female who has above history reviewed by me today presents to reestablish care Patient reports feeling fatigue.  Review of Systems  Constitutional:  Positive for malaise/fatigue. Negative for chills, fever and weight loss.  HENT:  Negative for hearing loss and nosebleeds.   Eyes:  Negative for photophobia and pain.  Respiratory:  Negative for cough and sputum production.   Cardiovascular:  Negative for chest pain and palpitations.  Gastrointestinal:  Negative for abdominal pain, heartburn, nausea and vomiting.  Genitourinary:  Negative for dysuria.  Musculoskeletal:  Negative for myalgias.  Skin:  Negative for rash.  Neurological:  Negative for dizziness.  Endo/Heme/Allergies:  Does not bruise/bleed easily.  Psychiatric/Behavioral:  Positive for depression.     MEDICAL HISTORY:  Past Medical History:  Diagnosis Date   (HFpEF) heart failure with preserved ejection fraction (HCC)    a. Echo 2014: EF 65-70%, nl WM, mildly dilated LA, PASP nl; b. 12/2014 Echo: EF 65-70%, no rwma, mod septal hypertrophy w/o LVOT gradient or SAM; c. 07/2017 Echo: EF 55-60%, no rwma, mildly dil RV w/ nl syst fxn. Mildly dil RA. Dilated IVC w/ elevated CVP. Triv post effusion.   Anxiety    Asthma    Chronic pain    on methadone , managed by Dr. Pollyann Brinks   Concussion    hx of 4   Coronary artery disease, non-occlusive    a. LHC 1/18: proximal to mid LAD 40% stenosed, mid LAD 30% stenosed, mid RCA 20% stenosed, distal RCA 20% stenosed, EF 55-65%, LVEDP normal   Depression    DJD (degenerative joint disease), multiple sites    History of shingles    HOCM (hypertrophic obstructive cardiomyopathy) (HCC)    Hypertension    Iron  deficiency anemia    Long QT interval    Obesity    Palpitations    a. 24 hour Holter: NSR, sinus brady down to 48, occasional PVCs & couplets, 8 beats NSVT; b. 30 day event monitor 2015: NSR with rare PVC.   Psoriasis    Syncope and collapse    Vitamin D  deficiency    Wears dentures     full upper and lower    SURGICAL HISTORY: Past Surgical History:  Procedure Laterality Date   ABDOMINOPLASTY     tummy tuck ? year ;she thinks 2012 to 2014   BARIATRIC SURGERY  2001   CARDIAC CATHETERIZATION Left 04/16/2016   Procedure: Left Heart Cath and Coronary Angiography;  Surgeon: Devorah Fonder, MD;  Location: [facility] INVASIVE CV LAB;  Service: Cardiovascular;  Laterality: Left;   CHOLECYSTECTOMY  2001   COLONOSCOPY WITH PROPOFOL  N/A 09/28/2018   Procedure: COLONOSCOPY WITH PROPOFOL ;  Surgeon: Irby Mannan, MD;  Location:  [facility];  Service: Endoscopy;  Laterality: N/A;   ESOPHAGOGASTRODUODENOSCOPY (EGD) WITH PROPOFOL  N/A 09/28/2018   Procedure: ESOPHAGOGASTRODUODENOSCOPY (EGD) WITH BIOPSY;  Surgeon: Irby Mannan, MD;  Location: [facility];  Service: Endoscopy;  Laterality: N/A;   EXTERNAL FIXATION LEG Left 10/29/2020   Procedure: EXTERNAL FIXATION LEFT KNEE;  Surgeon: Hardy Lia, MD;  Location: [facility] OR;  Service: Orthopedics;  Laterality: Left;   GALLBLADDER SURGERY     GASTRIC BYPASS  2001   GASTROPLASTY      SOCIAL HISTORY: Social History   Socioeconomic History   Marital status: Widowed    Spouse name: Not on file   Number of children: 1   Years of education: assoc degree   Highest education level: Associate degree: occupational, Scientist, product/process development, or vocational program  Occupational History   Not on file  Tobacco Use   Smoking status: Never   Smokeless tobacco: Never  Vaping Use   Vaping status: Never Used  Substance and Sexual Activity   Alcohol use: Not Currently    Comment: holidays   Drug use: No   Sexual activity: Not Currently  Other Topics Concern   Not on file  Social History Narrative   Adopted daughter Rod [place] [phone] (now lives in [place] going to [place] has apt there)   Significant other mac [phone], former husband died    Teacher ages 4 and up    Never smoker    No guns   Wears seat belt    No  caffeine   Social Drivers of Corporate investment banker Strain: High Risk (11/06/2017)   Overall Financial Resource Strain (CARDIA)    Difficulty of Paying Living Expenses: Very hard  Food Insecurity: No Food Insecurity (11/06/2017)   Hunger Vital Sign    Worried About Running Out of Food in the Last Year: Never true    Ran Out of Food in the Last Year: Never true  Transportation Needs: No Transportation Needs (11/06/2017)   PRAPARE - Administrator, Civil Service (Medical): No    Lack of Transportation (Non-Medical): No  Physical Activity: Inactive (11/06/2017)   Exercise Vital Sign    Days of Exercise per Week: 0 days    Minutes of Exercise per Session: 0 min  Stress: Stress Concern Present (11/06/2017)   [employer] of Occupational Health - Occupational Stress Questionnaire    Feeling of Stress : Very much  Social Connections: Socially Isolated (11/06/2017)   Social Connection and Isolation Panel [NHANES]    Frequency of Communication with Friends and Family: Once a week    Frequency of Social Gatherings with Friends and Family: Never    Attends Religious Services: Never    Database administrator or Organizations: No    Attends Banker Meetings: Never    Marital Status: Widowed  Intimate Partner Violence: Not At Risk (11/06/2017)   Humiliation, Afraid, Rape, and Kick questionnaire    Fear of Current or Ex-Partner: No    Emotionally Abused: No    Physically Abused: No    Sexually Abused: No    FAMILY HISTORY: Family History  Problem Relation Age of Onset   Heart attack Mother    Pancreatic cancer Mother 38   Early death Mother    Heart attack Father 93       MI   Early death Father    Heart disease Father    Heart attack Brother    Heart disease Brother  Arthritis Brother    Depression Brother    Diabetes Brother    Heart attack Maternal Grandmother    Cancer Maternal Grandmother    Heart disease Maternal Grandmother    Lung cancer  Maternal Grandmother    Pancreatic cancer Maternal Grandmother    Cancer Paternal Grandmother        ? type    Diabetes Paternal Grandmother    Breast cancer Maternal Aunt    Cancer Maternal Aunt        GYN   Cancer Maternal Aunt        GYN   Pancreatic cancer Maternal Uncle    Pancreatic cancer Maternal Uncle     ALLERGIES:  is allergic to covid-19 (mrna) vaccine Proofreader) [covid-19 (mrna) vaccine], penicillins, bee venom, doxycycline , and venlafaxine .  MEDICATIONS:  Current Outpatient Medications  Medication Sig Dispense Refill   albuterol  (PROAIR  HFA) 108 (90 Base) MCG/ACT inhaler Inhale 1-2 puffs into the lungs every 6 (six) hours as needed for wheezing or shortness of breath. 54 g 3   carvedilol  (COREG ) 6.25 MG tablet TAKE ONE TABLET (6.25 MG TOTAL) BY MOUTH TWICE DAILY @ 9AM & 5PM WITH A MEAL 60 tablet 11   Cyanocobalamin  (B-12) 2500 MCG SUBL Place 2,500 mcg under the tongue daily. 30 tablet 6   diclofenac  Sodium (VOLTAREN ) 1 % GEL Apply topically.     EPINEPHrine  0.3 mg/0.3 mL IJ SOAJ injection Inject 0.3 mg into the muscle as needed for anaphylaxis. 1 each 2   furosemide  (LASIX ) 20 MG tablet Take 1 tablet (20 mg total) by mouth daily as needed. 90 tablet 0   gabapentin  (NEURONTIN ) 400 MG capsule LIMIT 2 TABLETS IN THE A.M. AND MIDDAY AND 3 TABLETS EACH EVENING     isosorbide  mononitrate (IMDUR ) 60 MG 24 hr tablet TAKE ONE TABLET (60MG ) BY MOUTH DAILY AT 9 AM 90 tablet 11   Magnesium  Oxide 400 MG CAPS Take 1 capsule (400 mg total) by mouth daily. 30 capsule 6   methadone  (DOLOPHINE ) 10 MG tablet Limit 1 tablets by mouth 3 times per day if tolerated per methadone  clinic     mirtazapine  (REMERON ) 15 MG tablet Take 1 tablet (15 mg total) by mouth as directed. TAKE ONE TABLET BY MOUTH DAILY AT 9 PM AT BEDTIME- no refills without appointment 30 tablet 0   nitroGLYCERIN  (NITROSTAT ) 0.4 MG SL tablet PLACE 1 TABLET UNDER THE TONGUE EVERY 5 MINUTES AS NEEDED FOR CHEST PAIN. AFTER 2ND DOSE  CALL 911 25 tablet 2   rosuvastatin  (CRESTOR ) 10 MG tablet TAKE ONE TABLET (10MG ) BY MOUTH DAILY AT 5 PM 90 tablet 11   tiZANidine  (ZANAFLEX ) 4 MG tablet Take 4 mg by mouth 2 (two) times daily.     Current Facility-Administered Medications  Medication Dose Route Frequency Provider Last Rate Last Admin   cyanocobalamin  ((VITAMIN B-12)) injection 1,000 mcg  1,000 mcg Intramuscular Q30 days McLean-Scocuzza, Karon Packer, MD   1,000 mcg at 06/30/22 1513     PHYSICAL EXAMINATION:  Vitals:   07/27/23 1447  BP: 114/64  Pulse: (!) 59  Resp: 18  Temp: (!) 96 F (35.6 C)  SpO2: 100%   Filed Weights   07/27/23 1447  Weight: 201 lb 11.2 oz (91.5 kg)    Physical Exam Constitutional:      General: She is not in acute distress.    Appearance: She is not diaphoretic.     Comments: Obese  HENT:     Head: Normocephalic and atraumatic.  Eyes:     General: No scleral icterus.       Left eye: No discharge.  Neck:     Vascular: No JVD.  Cardiovascular:     Rate and Rhythm: Normal rate and regular rhythm.  Pulmonary:     Effort: Pulmonary effort is normal. No respiratory distress.     Breath sounds: Normal breath sounds. No wheezing.  Abdominal:     General: There is no distension.  Musculoskeletal:        General: Normal range of motion.     Cervical back: Normal range of motion and neck supple.  Lymphadenopathy:     Cervical: No cervical adenopathy.  Skin:    Findings: No erythema or rash.  Neurological:     Mental Status: She is alert and oriented to person, place, and time. Mental status is at baseline.     Motor: No abnormal muscle tone.  Psychiatric:        Mood and Affect: Affect normal.        Judgment: Judgment normal.      LABORATORY DATA:  I have reviewed the data as listed Lab Results  Component Value Date   WBC 7.2 07/27/2023   HGB 13.1 07/27/2023   HCT 39.0 07/27/2023   MCV 92.2 07/27/2023   PLT 187 07/27/2023      Latest Ref Rng & Units 06/09/2023    2:58  PM 06/26/2022    2:16 PM 12/16/2021    9:34 AM  CMP  Glucose 70 - 99 mg/dL 79  90    BUN 6 - 23 mg/dL 12  8    Creatinine 0.45 - 1.20 mg/dL 4.09  8.11    Sodium 914 - 145 mEq/L 142  138    Potassium 3.5 - 5.1 mEq/L 4.0  4.3    Chloride 96 - 112 mEq/L 102  103    CO2 19 - 32 mEq/L 32  27    Calcium  8.4 - 10.5 mg/dL 9.4  8.7    Total Protein 6.0 - 8.3 g/dL 7.3  7.6    Total Bilirubin 0.2 - 1.2 mg/dL 0.5  0.6    Alkaline Phos 39 - 117 U/L 118  139  144   AST 0 - 37 U/L 35  27    ALT 0 - 35 U/L 25  13       Lab Results  Component Value Date   IRON  74 07/27/2023   TIBC 330 07/27/2023   IRONPCTSAT 22 07/27/2023   FERRITIN 114 07/27/2023

## 2023-07-27 NOTE — Assessment & Plan Note (Addendum)
#  History of iron  deficiency anemia.  In the context of gastric bypass. Lab Results  Component Value Date   HGB 13.1 07/27/2023   TIBC 330 07/27/2023   IRONPCTSAT 22 07/27/2023   FERRITIN 114 07/27/2023    I will hold off Venofer

## 2023-07-27 NOTE — Assessment & Plan Note (Signed)
 B12 level is pending She gets B12 injections every 6 months  Recommend patient to try sublingual B12 2500mcg daily.

## 2023-07-28 ENCOUNTER — Telehealth: Payer: Self-pay

## 2023-07-28 NOTE — Telephone Encounter (Signed)
 Copied from CRM [id number]. Topic: Referral - Request for Referral >> Jul 28, 2023  9:26 AM Allyne Areola wrote: Did the patient discuss referral with their provider in the last year? No (If No - schedule appointment) (If Yes - send message)  Appointment offered? No, [facility] is calling to request a referral for the patient. She has an appointment today at 3:40 pm and would need the referral today. Fax number [phone], call back number [phone]  Type of order/referral and detailed reason for visit: Med Management psychology  Preference of office, provider, location: [facility]   If referral order, have you been seen by this specialty before? No (If Yes, this issue or another issue? When? Where?  Can we respond through MyChart? No

## 2023-07-29 NOTE — Telephone Encounter (Signed)
 Faxed over the referral also call and left them a voicemail to  let them know that the order has been faxed over.

## 2023-08-04 ENCOUNTER — Ambulatory Visit: Admitting: Family Medicine

## 2023-08-04 ENCOUNTER — Other Ambulatory Visit
Admission: RE | Admit: 2023-08-04 | Discharge: 2023-08-04 | Disposition: A | Discharge status: Home / Self Care | Attending: Urology | Admitting: Urology

## 2023-08-04 ENCOUNTER — Ambulatory Visit: Admitting: Urology

## 2023-08-04 ENCOUNTER — Other Ambulatory Visit: Payer: Self-pay

## 2023-08-04 VITALS — BP 95/55 | HR 63 | Ht 65.0 in | Wt 205.0 lb

## 2023-08-04 DIAGNOSIS — R350 Frequency of micturition: Secondary | ICD-10-CM | POA: Insufficient documentation

## 2023-08-04 DIAGNOSIS — R1011 Right upper quadrant pain: Secondary | ICD-10-CM

## 2023-08-04 LAB — URINALYSIS, COMPLETE (UACMP) WITH MICROSCOPIC
Bilirubin Urine: NEGATIVE
Glucose, UA: NEGATIVE mg/dL
Ketones, ur: NEGATIVE mg/dL
Nitrite: NEGATIVE
Protein, ur: NEGATIVE mg/dL
Specific Gravity, Urine: 1.015 (ref 1.005–1.030)
pH: 5.5 (ref 5.0–8.0)

## 2023-08-04 LAB — BLADDER SCAN AMB NON-IMAGING

## 2023-08-04 NOTE — Patient Instructions (Signed)
 Please call [phone] or [phone] to schedule your imaging prior to your appointment.

## 2023-08-04 NOTE — Progress Notes (Signed)
 08/04/23 4:01 PM   Cheyenne Gray Apr 28, 1965 [id number]  CC: Urinary symptoms, right-sided flank pain  HPI: 58 year old female with a number of issues including obesity with BMI of 35, history of gastric bypass, congestive heart failure secondary to HOCM, chronic pain, CAD referred for the above issues.  She has noticed at least a few months of worsening urinary frequency and urgency, nocturia 2 times.  She drinks primarily soda during the day.  She also has had some right-sided flank pain and she is worried this could be kidney stones.  It looks like her PCP ordered a CT, but this was never completed.  She denies any gross hematuria or dysuria.  Urinalysis today 6-10 squamous cells, 6-10 WBC, 0-5 RBC, few bacteria, trace leukocytes.  PVR normal at 90ml.   PMH: Past Medical History:  Diagnosis Date   (HFpEF) heart failure with preserved ejection fraction (HCC)    a. Echo 2014: EF 65-70%, nl WM, mildly dilated LA, PASP nl; b. 12/2014 Echo: EF 65-70%, no rwma, mod septal hypertrophy w/o LVOT gradient or SAM; c. 07/2017 Echo: EF 55-60%, no rwma, mildly dil RV w/ nl syst fxn. Mildly dil RA. Dilated IVC w/ elevated CVP. Triv post effusion.   Anxiety    Asthma    Chronic pain    on methadone , managed by Dr. Pollyann Brinks   Concussion    hx of 4   Coronary artery disease, non-occlusive    a. LHC 1/18: proximal to mid LAD 40% stenosed, mid LAD 30% stenosed, mid RCA 20% stenosed, distal RCA 20% stenosed, EF 55-65%, LVEDP normal   Depression    DJD (degenerative joint disease), multiple sites    History of shingles    HOCM (hypertrophic obstructive cardiomyopathy) (HCC)    Hypertension    Iron  deficiency anemia    Long QT interval    Obesity    Palpitations    a. 24 hour Holter: NSR, sinus brady down to 48, occasional PVCs & couplets, 8 beats NSVT; b. 30 day event monitor 2015: NSR with rare PVC.   Psoriasis    Syncope and collapse    Vitamin D  deficiency    Wears dentures    full upper  and lower    Surgical History: Past Surgical History:  Procedure Laterality Date   ABDOMINOPLASTY     tummy tuck ? year ;she thinks 2012 to 2014   BARIATRIC SURGERY  2001   CARDIAC CATHETERIZATION Left 04/16/2016   Procedure: Left Heart Cath and Coronary Angiography;  Surgeon: Devorah Fonder, MD;  Location: [facility] INV[facility];  Service: Cardiovascular;  Laterality: Left;   CHOLECYSTECTOMY  2001   COLONOSCOPY WITH PROPOFOL  N/A 09/28/2018   Procedure: COLONOSCOPY WITH PROPOFOL ;  Surgeon: Irby Mannan, MD;  Location: [facility];  Service: Endoscopy;  Laterality: N/A;   ESOPHAGOGASTRODUODENOSCOPY (EGD) WITH PROPOFOL  N/A 09/28/2018   Procedure: ESOPHAGOGASTRODUODENOSCOPY (EGD) WITH BIOPSY;  Surgeon: Irby Mannan, MD;  Location: [facility];  Service: Endoscopy;  Laterality: N/A;   EXTERNAL FIXATION LEG Left 10/29/2020   Procedure: EXTERNAL FIXATION LEFT KNEE;  Surgeon: Hardy Lia, MD;  Location: [facility] OR;  Service: Orthopedics;  Laterality: Left;   GALLBLADDER SURGERY     GASTRIC BYPASS  2001   GASTROPLASTY       Family History: Family History  Problem Relation Age of Onset   Heart attack Mother    Pancreatic cancer Mother 68   Early death Mother    Heart attack Father 29  MI   Early death Father    Heart disease Father    Heart attack Brother    Heart disease Brother    Arthritis Brother    Depression Brother    Diabetes Brother    Heart attack Maternal Grandmother    Cancer Maternal Grandmother    Heart disease Maternal Grandmother    Lung cancer Maternal Grandmother    Pancreatic cancer Maternal Grandmother    Cancer Paternal Grandmother        ? type    Diabetes Paternal Grandmother    Breast cancer Maternal Aunt    Cancer Maternal Aunt        GYN   Cancer Maternal Aunt        GYN   Pancreatic cancer Maternal Uncle    Pancreatic cancer Maternal Uncle     Social History:  reports that she has never smoked. She has never  used smokeless tobacco. She reports that she does not currently use alcohol. She reports that she does not use drugs.  Physical Exam: BP (!) 95/55 (BP Location: Left Arm, Patient Position: Sitting, Cuff Size: Large)   Pulse 63   Ht 5\' 5"  (1.651 m)   Wt 205 lb (93 kg)   LMP 02/12/2016 (Approximate)   BMI 34.11 kg/m    Constitutional:  Alert and oriented, No acute distress. Cardiovascular: No clubbing, cyanosis, or edema. Respiratory: Normal respiratory effort, no increased work of breathing. GI: Abdomen is soft, nontender, nondistended, no abdominal masses   Laboratory Data: Reviewed, see HPI  Assessment & Plan:   58 year old female with right-sided flank pain and urinary symptoms of urgency and frequency, urinalysis benign.  We discussed that overactive bladder (OAB) is not a disease, but is a symptom complex that is generally not life-threatening.  Symptoms typically include urinary urgency, frequency, and urge incontinence.  There are numerous treatment options, however there are risks and benefits with both medical and surgical management.  First-line treatment is behavioral therapies including bladder training, pelvic floor muscle training, and fluid management.  Second line treatments include oral antimuscarinics(Ditropan er, Trospium) and beta-3 agonist (Mybetriq). There is typically a period of medication trial (4-8 weeks) to find the optimal therapy and dosing. If symptoms are bothersome despite the above management, third line options include intra-detrusor botox, peripheral tibial nerve stimulation (PTNS), and interstim (SNS). These are more invasive treatments with higher side effect profile, but may improve quality of life for patients with severe OAB symptoms.   -CT ordered to evaluate for nephrolithiasis -Recommended starting with behavioral strategies regarding urinary symptoms -If CT benign could trial oxybutynin 10 mg XL daily   Jay Meth, MD 08/04/2023  [facility] [address] [place], [place] [address] [phone]

## 2023-09-01 ENCOUNTER — Inpatient Hospital Stay

## 2023-09-01 ENCOUNTER — Inpatient Hospital Stay: Admitting: Licensed Clinical Social Worker

## 2023-12-30 DIAGNOSIS — M5440 Lumbago with sciatica, unspecified side: Secondary | ICD-10-CM | POA: Diagnosis not present

## 2023-12-30 DIAGNOSIS — G8921 Chronic pain due to trauma: Secondary | ICD-10-CM | POA: Diagnosis not present

## 2023-12-30 DIAGNOSIS — M542 Cervicalgia: Secondary | ICD-10-CM | POA: Diagnosis not present

## 2023-12-30 DIAGNOSIS — Z79891 Long term (current) use of opiate analgesic: Secondary | ICD-10-CM | POA: Diagnosis not present

## 2023-12-30 DIAGNOSIS — M791 Myalgia, unspecified site: Secondary | ICD-10-CM | POA: Diagnosis not present

## 2023-12-30 DIAGNOSIS — K5903 Drug induced constipation: Secondary | ICD-10-CM | POA: Diagnosis not present

## 2023-12-30 DIAGNOSIS — G894 Chronic pain syndrome: Secondary | ICD-10-CM | POA: Diagnosis not present

## 2023-12-30 DIAGNOSIS — I10 Essential (primary) hypertension: Secondary | ICD-10-CM | POA: Diagnosis not present

## 2024-01-26 ENCOUNTER — Inpatient Hospital Stay

## 2024-01-26 ENCOUNTER — Other Ambulatory Visit

## 2024-01-26 ENCOUNTER — Ambulatory Visit: Admitting: Oncology

## 2024-01-26 ENCOUNTER — Telehealth: Payer: Self-pay | Admitting: Oncology

## 2024-01-26 NOTE — Telephone Encounter (Signed)
 I called pt back and left a vm that the next available appt is 12/24. I asked pt to call  back to confirm if that date would work for her. Scheduling phone number provided.

## 2024-01-26 NOTE — Telephone Encounter (Signed)
 Pt called to r/s appts for today due to recent hospital stay - told pt that I would message the team and would call back w/new appt date/times - [place]

## 2024-03-08 ENCOUNTER — Other Ambulatory Visit: Payer: Self-pay | Admitting: Oncology

## 2024-03-09 ENCOUNTER — Inpatient Hospital Stay

## 2024-03-09 ENCOUNTER — Inpatient Hospital Stay: Admitting: Oncology

## 2024-03-09 ENCOUNTER — Inpatient Hospital Stay: Attending: Oncology

## 2024-03-09 ENCOUNTER — Encounter: Payer: Self-pay | Admitting: Oncology

## 2024-03-09 VITALS — BP 115/78 | HR 58 | Temp 97.7°F | Resp 18 | Wt 198.2 lb

## 2024-03-09 DIAGNOSIS — D508 Other iron deficiency anemias: Secondary | ICD-10-CM

## 2024-03-09 DIAGNOSIS — Z9884 Bariatric surgery status: Secondary | ICD-10-CM | POA: Insufficient documentation

## 2024-03-09 DIAGNOSIS — Z809 Family history of malignant neoplasm, unspecified: Secondary | ICD-10-CM | POA: Diagnosis not present

## 2024-03-09 DIAGNOSIS — I422 Other hypertrophic cardiomyopathy: Secondary | ICD-10-CM | POA: Diagnosis not present

## 2024-03-09 LAB — CBC WITH DIFFERENTIAL (CANCER CENTER ONLY)
Abs Immature Granulocytes: 0.03 K/uL (ref 0.00–0.07)
Basophils Absolute: 0.1 K/uL (ref 0.0–0.1)
Basophils Relative: 1 %
Eosinophils Absolute: 0.1 K/uL (ref 0.0–0.5)
Eosinophils Relative: 2 %
HCT: 34.4 % — ABNORMAL LOW (ref 36.0–46.0)
Hemoglobin: 11.3 g/dL — ABNORMAL LOW (ref 12.0–15.0)
Immature Granulocytes: 0 %
Lymphocytes Relative: 25 %
Lymphs Abs: 1.7 K/uL (ref 0.7–4.0)
MCH: 31 pg (ref 26.0–34.0)
MCHC: 32.8 g/dL (ref 30.0–36.0)
MCV: 94.5 fL (ref 80.0–100.0)
Monocytes Absolute: 0.7 K/uL (ref 0.1–1.0)
Monocytes Relative: 11 %
Neutro Abs: 4.1 K/uL (ref 1.7–7.7)
Neutrophils Relative %: 61 %
Platelet Count: 166 K/uL (ref 150–400)
RBC: 3.64 MIL/uL — ABNORMAL LOW (ref 3.87–5.11)
RDW: 12.1 % (ref 11.5–15.5)
WBC Count: 6.7 K/uL (ref 4.0–10.5)
nRBC: 0 % (ref 0.0–0.2)

## 2024-03-09 LAB — FERRITIN: Ferritin: 126 ng/mL (ref 11–307)

## 2024-03-09 LAB — VITAMIN B12: Vitamin B-12: 712 pg/mL (ref 180–914)

## 2024-03-09 LAB — RETIC PANEL
Immature Retic Fract: 7.1 % (ref 2.3–15.9)
RBC.: 3.64 MIL/uL — ABNORMAL LOW (ref 3.87–5.11)
Retic Count, Absolute: 62.6 K/uL (ref 19.0–186.0)
Retic Ct Pct: 1.7 % (ref 0.4–3.1)
Reticulocyte Hemoglobin: 33.1 pg

## 2024-03-09 LAB — IRON AND TIBC
Iron: 36 ug/dL (ref 28–170)
Saturation Ratios: 12 % (ref 10.4–31.8)
TIBC: 290 ug/dL (ref 250–450)
UIBC: 254 ug/dL

## 2024-03-09 MED ORDER — CYANOCOBALAMIN 1000 MCG/ML IJ SOLN
1000.0000 ug | Freq: Once | INTRAMUSCULAR | Status: AC
  Administered 2024-03-09: 1000 ug via INTRAMUSCULAR
  Filled 2024-03-09: qty 1

## 2024-03-09 NOTE — Assessment & Plan Note (Signed)
 I have previously referred her to genetic counselor. She did not establish care.

## 2024-03-09 NOTE — Assessment & Plan Note (Addendum)
#  History of iron  deficiency anemia.  In the context of gastric bypass. Lab Results  Component Value Date   HGB 11.3 (L) 03/09/2024   TIBC 290 03/09/2024   IRONPCTSAT 12 03/09/2024   FERRITIN 126 03/09/2024    Iron  saturation is borderline 12.  Recommend 1 dose of Venofer  as maintenance.

## 2024-03-09 NOTE — Assessment & Plan Note (Signed)
 B12 level is pending She gets B12 injections every 6 months  Recommend patient to try sublingual B12 2500mcg daily.

## 2024-03-10 ENCOUNTER — Encounter: Payer: Self-pay | Admitting: Oncology

## 2024-03-10 DIAGNOSIS — I422 Other hypertrophic cardiomyopathy: Secondary | ICD-10-CM | POA: Insufficient documentation

## 2024-03-10 NOTE — Assessment & Plan Note (Signed)
 Follow up with cardiology at [facility].

## 2024-03-10 NOTE — Progress Notes (Signed)
 " Hematology/Oncology Progress note [phone] [phone]        REASON FOR VISIT Follow-up for iron  deficiency anemia, history of gastric bypass  ASSESSMENT & PLAN:   IDA (iron  deficiency anemia) #History of iron  deficiency anemia.  In the context of gastric bypass. Lab Results  Component Value Date   HGB 11.3 (L) 03/09/2024   TIBC 290 03/09/2024   IRONPCTSAT 12 03/09/2024   FERRITIN 126 03/09/2024    Iron  saturation is borderline 12.  Recommend 1 dose of Venofer  as maintenance.  H/O gastric bypass B12 level is wnl. She gets B12 injections every 6 months  Recommend patient to try sublingual B12 2500mcg daily.   Family history of cancer I have previously referred her to dentist. She did not establish care.   Hypertrophic cardiomyopathy (HCC) Follow up with cardiology at [facility].   Orders Placed This Encounter  Procedures   CBC with Differential (Cancer Center Only)    Standing Status:   Future    Expected Date:   09/07/2024    Expiration Date:   12/06/2024   Iron  and TIBC    Standing Status:   Future    Expected Date:   09/07/2024    Expiration Date:   12/06/2024   Ferritin    Standing Status:   Future    Expected Date:   09/07/2024    Expiration Date:   12/06/2024   Vitamin B12    Standing Status:   Future    Expected Date:   09/07/2024    Expiration Date:   12/06/2024   Folate    Standing Status:   Future    Expected Date:   09/07/2024    Expiration Date:   12/06/2024   Follow-up in 6 months. All questions were answered. The patient knows to call the clinic with any problems, questions or concerns.  Zelphia Cap, MD, PhD [facility] Hematology Oncology 03/09/2024    HISTORY OF PRESENTING ILLNESS:  This is a patient who used to follow up with Dr. Marina presents for follow-up of management of her anemia.   History of gastric bypass in 2001 Patient was last seen by Dr. Marina in July 2016 for iron  deficiency.  She was again being seen by  nurse practitioner on August 21, 2015 for follow-up of her iron  deficiency anemia.  She has a history of iron  deficiency anemia remote gastric bypass history in 2001.  She used to IV iron  infusion with venofer  100mg  Every 3 months but she has a follow-up with us  for about a year.  Patient reports feeling fatigued.  She did not have much of appetite.  Denies any nausea vomiting diarrhea or abdominal pain.  She has no unintentional weight loss, instead she feels that she has gained a few pounds.  # . She follows up with cardiology for CAD, long QT syndrome, chest pain, lower extremity edema.  INTERVAL HISTORY Cheyenne Gray is a 58 y.o. female who has above history reviewed by me today presents for follow up visit for management of lung nodule, history of iron  deficiency anemia due to gastric bypass.  .  Patient was last seen today by me.  Virtual visit in February 2021. Recently she went to emergency room at [facility] for evaluation of chest pain.  Patient had CT PE as well as abdomen scan done in the emergency room. 01/31/2020, CT chest PE protocol showed no PE.  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  Tiny punctated  bilateral pulmonary nodules-3 mm nodule near the left lung base, punctate nodule in the right lower lobe.  Mild bronchial wall thickening.  Patulous fluid-filled esophagus. CT abdomen pelvis with contrast showed no etiology of abdominal pain was identified.  Mild intra and extra hepatic bile duct dilatation which can be seen status post cholecystectomy.  Patient follows up with cardiology for Hypertrophic cardiomyopathy, MYBPC 3 gene. Patient reports extensive family history of cancer.  She is concerned about lung nodules that was detected on her CT scan on 01/31/2020-3 mm nodule left lung base, punctate nodule right lower lobe. Repeat CT chest was ordered and she did not get it done.    INTERVAL HISTORY Cheyenne Gray is a 58 y.o. female who has above  history reviewed by me today presents to reestablish care Patient reports feeling fatigue. She follows up with cardiology for hypertrophic cardiomyopathy. Family history of sudden death. There is plan for ICD placement  Review of Systems  Constitutional:  Positive for malaise/fatigue. Negative for chills, fever and weight loss.  HENT:  Negative for hearing loss and nosebleeds.   Eyes:  Negative for photophobia and pain.  Respiratory:  Negative for cough and sputum production.   Cardiovascular:  Negative for chest pain and palpitations.  Gastrointestinal:  Negative for abdominal pain, heartburn, nausea and vomiting.  Genitourinary:  Negative for dysuria.  Musculoskeletal:  Negative for myalgias.  Skin:  Negative for rash.  Neurological:  Negative for dizziness.  Endo/Heme/Allergies:  Does not bruise/bleed easily.  Psychiatric/Behavioral:  Positive for depression.     MEDICAL HISTORY:  Past Medical History:  Diagnosis Date   (HFpEF) heart failure with preserved ejection fraction (HCC)    a. Echo 2014: EF 65-70%, nl WM, mildly dilated LA, PASP nl; b. 12/2014 Echo: EF 65-70%, no rwma, mod septal hypertrophy w/o LVOT gradient or SAM; c. 07/2017 Echo: EF 55-60%, no rwma, mildly dil RV w/ nl syst fxn. Mildly dil RA. Dilated IVC w/ elevated CVP. Triv post effusion.   Anxiety    Asthma    Chronic pain    on methadone , managed by Dr. Dannial   Concussion    hx of 4   Coronary artery disease, non-occlusive    a. LHC 1/18: proximal to mid LAD 40% stenosed, mid LAD 30% stenosed, mid RCA 20% stenosed, distal RCA 20% stenosed, EF 55-65%, LVEDP normal   Depression    DJD (degenerative joint disease), multiple sites    History of shingles    HOCM (hypertrophic obstructive cardiomyopathy) (HCC)    Hypertension    Iron  deficiency anemia    Long QT interval    Obesity    Palpitations    a. 24 hour Holter: NSR, sinus brady down to 48, occasional PVCs & couplets, 8 beats NSVT; b. 30 day event monitor  2015: NSR with rare PVC.   Psoriasis    Syncope and collapse    Vitamin D  deficiency    Wears dentures    full upper and lower    SURGICAL HISTORY: Past Surgical History:  Procedure Laterality Date   ABDOMINOPLASTY     tummy tuck ? year ;she thinks 2012 to 2014   BARIATRIC SURGERY  2001   CARDIAC CATHETERIZATION Left 04/16/2016   Procedure: Left Heart Cath and Coronary Angiography;  Surgeon: Evalene JINNY Lunger, MD;  Location: [facility] INVASIVE CV LAB;  Service: Cardiovascular;  Laterality: Left;   CHOLECYSTECTOMY  2001   COLONOSCOPY WITH PROPOFOL  N/A 09/28/2018   Procedure: COLONOSCOPY WITH PROPOFOL ;  Surgeon:  Janalyn Keene NOVAK, MD;  Location: [facility];  Service: Endoscopy;  Laterality: N/A;   ESOPHAGOGASTRODUODENOSCOPY (EGD) WITH PROPOFOL  N/A 09/28/2018   Procedure: ESOPHAGOGASTRODUODENOSCOPY (EGD) WITH BIOPSY;  Surgeon: Janalyn Keene NOVAK, MD;  Location: [facility];  Service: Endoscopy;  Laterality: N/A;   EXTERNAL FIXATION LEG Left 10/29/2020   Procedure: EXTERNAL FIXATION LEFT KNEE;  Surgeon: Celena Sharper, MD;  Location[facility];  Service: Orthopedics;  Laterality: Left;   GALLBLADDER SURGERY     GASTRIC BYPASS  2001   GASTROPLASTY      SOCIAL HISTORY: Social History   Socioeconomic History   Marital status: Widowed    Spouse name: Not on file   Number of children: 1   Years of education: assoc degree   Highest education level: Associate degree: occupational, scientist, product/process development, or vocational program  Occupational History   Not on file  Tobacco Use   Smoking status: Never   Smokeless tobacco: Never  Vaping Use   Vaping status: Never Used  Substance and Sexual Activity   Alcohol use: Not Currently    Comment: holidays   Drug use: No   Sexual activity: Not Currently  Other Topics Concern   Not on file  Social History Narrative   Adopted daughter Warrick [phone] (now lives in [place] going [place] has apt [place])   Significant other mac [phone], former husband died    Teacher ages 4 and up    Never smoker    No guns   Wears seat belt    No caffeine   Social Drivers of Health   Tobacco Use: Low Risk (03/09/2024)   Patient History    Smoking Tobacco Use: Never    Smokeless Tobacco Use: Never    Passive Exposure: Not on file  Financial Resource Strain: Not on file  Food Insecurity: Not on file  Transportation Needs: Not on file  Physical Activity: Not on file  Stress: Not on file  Social Connections: Not on file  Intimate Partner Violence: Not on file  Depression (PHQ2-9): High Risk (06/09/2023)   Depression (PHQ2-9)    PHQ-2 Score: 17  Alcohol Screen: Not on file  Housing: Not on file  Utilities: Not on file  Health Literacy: Not on file    FAMILY HISTORY: Family History  Problem Relation Age of Onset   Heart attack Mother    Pancreatic cancer Mother 67   Early death Mother    Heart attack Father 74       MI   Early death Father    Heart disease Father    Heart attack Brother    Heart disease Brother    Arthritis Brother    Depression Brother    Diabetes Brother    Heart attack Maternal Grandmother    Cancer Maternal Grandmother    Heart disease Maternal Grandmother    Lung cancer Maternal Grandmother    Pancreatic cancer Maternal Grandmother    Cancer Paternal Grandmother        ? type    Diabetes Paternal Grandmother    Breast cancer Maternal Aunt    Cancer Maternal Aunt        GYN   Cancer Maternal Aunt        GYN   Pancreatic cancer Maternal Uncle    Pancreatic cancer Maternal Uncle     ALLERGIES:  is allergic to covid-19 (mrna) vaccine (pfizer) [covid-19 (mrna) vaccine], penicillins, bee venom, pseudoephedrine, doxycycline , and venlafaxine .  MEDICATIONS:  Current Outpatient Medications  Medication Sig Dispense Refill   albuterol  (PROAIR  HFA) 108 (90 Base) MCG/ACT inhaler Inhale 1-2 puffs into the lungs every 6 (six) hours as needed for wheezing or shortness of breath. 54 g 3    carvedilol  (COREG ) 6.25 MG tablet TAKE ONE TABLET (6.25 MG TOTAL) BY MOUTH TWICE DAILY @ 9AM & 5PM WITH A MEAL 60 tablet 11   celecoxib (CELEBREX) 200 MG capsule Take 200 mg by mouth every morning.     Cyanocobalamin  (B-12) 2500 MCG SUBL Place 2,500 mcg under the tongue daily. 30 tablet 6   diclofenac  Sodium (VOLTAREN ) 1 % GEL Apply topically.     doxepin (SINEQUAN) 25 MG capsule Take 25 mg by mouth at bedtime.     EPINEPHrine  0.3 mg/0.3 mL IJ SOAJ injection Inject 0.3 mg into the muscle as needed for anaphylaxis. 1 each 2   ezetimibe (ZETIA) 10 MG tablet Take 10 mg by mouth daily.     fluticasone (FLONASE) 50 MCG/ACT nasal spray Place 2 sprays into both nostrils daily.     furosemide  (LASIX ) 20 MG tablet Take 1 tablet (20 mg total) by mouth daily as needed. 90 tablet 0   gabapentin  (NEURONTIN ) 400 MG capsule LIMIT 2 TABLETS IN THE A.M. AND MIDDAY AND 3 TABLETS EACH EVENING     isosorbide  mononitrate (IMDUR ) 60 MG 24 hr tablet TAKE ONE TABLET (60MG ) BY MOUTH DAILY AT 9 AM 90 tablet 11   LINZESS 145 MCG CAPS capsule Take 145 mcg by mouth daily.     Magnesium  Oxide 400 MG CAPS Take 1 capsule (400 mg total) by mouth daily. 30 capsule 6   methadone  (DOLOPHINE ) 10 MG tablet Limit 1 tablets by mouth 3 times per day if tolerated per methadone  clinic     nitroGLYCERIN  (NITROSTAT ) 0.4 MG SL tablet PLACE 1 TABLET UNDER THE TONGUE EVERY 5 MINUTES AS NEEDED FOR CHEST PAIN. AFTER 2ND DOSE CALL 911 25 tablet 2   PARoxetine (PAXIL) 20 MG tablet Take 20 mg by mouth daily.     prasugrel (EFFIENT) 10 MG TABS tablet Take 10 mg by mouth daily.     rosuvastatin  (CRESTOR ) 10 MG tablet TAKE ONE TABLET (10MG ) BY MOUTH DAILY AT 5 PM 90 tablet 11   tiZANidine  (ZANAFLEX ) 4 MG tablet Take 4 mg by mouth 2 (two) times daily.     Current Facility-Administered Medications  Medication Dose Route Frequency Provider Last Rate Last Admin   cyanocobalamin  ((VITAMIN B-12)) injection 1,000 mcg  1,000 mcg Intramuscular Q30 days  McLean-Scocuzza, Randine SAILOR, MD   1,000 mcg at 06/30/22 1513     PHYSICAL EXAMINATION:  Vitals:   03/09/24 1044  BP: 115/78  Pulse: (!) 58  Resp: 18  Temp: 97.7 F (36.5 C)  SpO2: 100%   Filed Weights   03/09/24 1044  Weight: 198 lb 3.2 oz (89.9 kg)    Physical Exam Constitutional:      General: She is not in acute distress.    Appearance: She is not diaphoretic.     Comments: Obese  HENT:     Head: Normocephalic and atraumatic.  Eyes:     General: No scleral icterus.       Left eye: No discharge.  Neck:     Vascular: No JVD.  Cardiovascular:     Rate and Rhythm: Normal rate.  Pulmonary:     Effort: Pulmonary effort is normal. No respiratory distress.     Breath sounds: Normal breath sounds. No wheezing.  Abdominal:     General: There is no distension.  Musculoskeletal:        General: Normal range of motion.     Cervical back: Normal range of motion and neck supple.  Lymphadenopathy:     Cervical: No cervical adenopathy.  Skin:    Findings: No erythema or rash.  Neurological:     Mental Status: She is alert and oriented to person, place, and time. Mental status is at baseline.     Motor: No abnormal muscle tone.  Psychiatric:        Mood and Affect: Mood and affect normal.        Judgment: Judgment normal.      LABORATORY DATA:  I have reviewed the data as listed Lab Results  Component Value Date   WBC 6.7 03/09/2024   HGB 11.3 (L) 03/09/2024   HCT 34.4 (L) 03/09/2024   MCV 94.5 03/09/2024   PLT 166 03/09/2024      Latest Ref Rng & Units 06/09/2023    2:58 PM 06/26/2022    2:16 PM 12/16/2021    9:34 AM  CMP  Glucose 70 - 99 mg/dL 79  90    BUN 6 - 23 mg/dL 12  8    Creatinine 9.59 - 1.20 mg/dL 8.93  9.05    Sodium 864 - 145 mEq/L 142  138    Potassium 3.5 - 5.1 mEq/L 4.0  4.3    Chloride 96 - 112 mEq/L 102  103    CO2 19 - 32 mEq/L 32  27    Calcium  8.4 - 10.5 mg/dL 9.4  8.7    Total Protein 6.0 - 8.3 g/dL 7.3  7.6    Total Bilirubin 0.2 -  1.2 mg/dL 0.5  0.6    Alkaline Phos 39 - 117 U/L 118  139  144   AST 0 - 37 U/L 35  27    ALT 0 - 35 U/L 25  13       Lab Results  Component Value Date   IRON  36 03/09/2024   TIBC 290 03/09/2024   IRONPCTSAT 12 03/09/2024   FERRITIN 126 03/09/2024     "

## 2024-03-27 ENCOUNTER — Other Ambulatory Visit: Payer: Self-pay | Admitting: Cardiovascular Disease

## 2024-04-06 ENCOUNTER — Ambulatory Visit: Payer: Self-pay

## 2024-04-06 NOTE — Telephone Encounter (Signed)
 Called Patient to let her know that we do not have any available appointments to go to Urgent Care. Patient states she went to [facility] and they did not have any flu tests so [facility]. [facility] at [facility] flu [facility] it was positive so she called UC and told them and they told her to call her PCP office and get medicine. Patient states she will figure it out.

## 2024-04-06 NOTE — Telephone Encounter (Signed)
 FYI Only or Action Required?: FYI only for provider: UC advised.  Patient was last seen in primary care on 07/07/2023 by Hope Merle, MD.  Called Nurse Triage reporting Sore Throat and Cough.  Symptoms began several days ago.  Interventions attempted: Rest, hydration, or home remedies.  Symptoms are: unchanged.  Triage Disposition: See HCP Within 4 Hours (Or PCP Triage) (overriding Call PCP Within 24 Hours)  Patient/caregiver understands and will follow disposition?: Unsure  Summary: flu positive   Reason for Triage: tested positive for flu with home flu test,no pcp, would like to be prescribed medication     Reason for Disposition  Patient is HIGH RISK (e.g., age > 64 years, pregnant, HIV+, or chronic medical condition)  Answer Assessment - Initial Assessment Questions Patient states that she was unknowingly exposed to the flu Wednesday of last week then developed symptoms. She went to [facility] and they advised her to buy a home test for flu as they did not have sufficient tests available. Home test positive. Patient has history of CHF and asthma and reports some mild SOB today that may be from current illness. Office visit advised, no appts available until 1/26, UC advised. Unsure if patient will go to UC.   1. WORST SYMPTOM: What is your worst symptom? (e.g., cough, runny nose, muscle aches, headache, sore throat, fever)      Sore throat, cough  2. ONSET: When did your flu symptoms start?      Friday  3. COUGH: How bad is the cough?       Unknown  4. RESPIRATORY DISTRESS: Describe your breathing.      Mild SOB today-does have CHF  5. FEVER: Do you have a fever? If Yes, ask: What is your temperature, how was it measured, and when did it start?     No  6. EXPOSURE: Were you exposed to someone with influenza?       Yes  7. FLU VACCINE: Did you get a flu shot this year?     No  8. HIGH RISK DISEASE: Do you have any chronic medical problems? (e.g., heart or  lung disease, asthma, weak immune system, or other HIGH RISK conditions)     CHF, Asthma  9. PREGNANCY: Is there any chance you are pregnant? When was your last menstrual period?     NA  10. OTHER SYMPTOMS: Do you have any other symptoms?  (e.g., runny nose, muscle aches, headache, sore throat)       Muscle aches, headache, fatigue  Protocols used: Influenza (Flu) - [facility]

## 2024-08-31 ENCOUNTER — Inpatient Hospital Stay

## 2024-09-07 ENCOUNTER — Inpatient Hospital Stay: Admitting: Oncology

## 2024-09-07 ENCOUNTER — Inpatient Hospital Stay
